# Patient Record
Sex: Female | Born: 1950
Health system: Southern US, Community
[De-identification: ages and names within clinical notes are randomized; demographics above are authoritative.]

## PROBLEM LIST (undated history)

## (undated) DIAGNOSIS — R0600 Dyspnea, unspecified: Secondary | ICD-10-CM

## (undated) DIAGNOSIS — D649 Anemia, unspecified: Secondary | ICD-10-CM

## (undated) DIAGNOSIS — J189 Pneumonia, unspecified organism: Secondary | ICD-10-CM

## (undated) DIAGNOSIS — R591 Generalized enlarged lymph nodes: Secondary | ICD-10-CM

## (undated) DIAGNOSIS — F32A Depression, unspecified: Secondary | ICD-10-CM

## (undated) DIAGNOSIS — C801 Malignant (primary) neoplasm, unspecified: Secondary | ICD-10-CM

## (undated) DIAGNOSIS — F1910 Other psychoactive substance abuse, uncomplicated: Secondary | ICD-10-CM

## (undated) DIAGNOSIS — R4586 Emotional lability: Secondary | ICD-10-CM

## (undated) DIAGNOSIS — R519 Headache, unspecified: Secondary | ICD-10-CM

## (undated) DIAGNOSIS — J449 Chronic obstructive pulmonary disease, unspecified: Secondary | ICD-10-CM

## (undated) DIAGNOSIS — M1990 Unspecified osteoarthritis, unspecified site: Secondary | ICD-10-CM

## (undated) DIAGNOSIS — G971 Other reaction to spinal and lumbar puncture: Secondary | ICD-10-CM

## (undated) DIAGNOSIS — J45909 Unspecified asthma, uncomplicated: Secondary | ICD-10-CM

## (undated) DIAGNOSIS — E039 Hypothyroidism, unspecified: Secondary | ICD-10-CM

## (undated) DIAGNOSIS — Z87442 Personal history of urinary calculi: Secondary | ICD-10-CM

## (undated) DIAGNOSIS — R51 Headache: Secondary | ICD-10-CM

## (undated) DIAGNOSIS — M869 Osteomyelitis, unspecified: Secondary | ICD-10-CM

## (undated) DIAGNOSIS — G4700 Insomnia, unspecified: Secondary | ICD-10-CM

## (undated) DIAGNOSIS — R918 Other nonspecific abnormal finding of lung field: Secondary | ICD-10-CM

## (undated) DIAGNOSIS — E278 Other specified disorders of adrenal gland: Secondary | ICD-10-CM

## (undated) HISTORY — DX: Unspecified asthma, uncomplicated: J45.909

## (undated) HISTORY — DX: Generalized enlarged lymph nodes: R59.1

## (undated) HISTORY — DX: Other psychoactive substance abuse, uncomplicated: F19.10

## (undated) HISTORY — DX: Other nonspecific abnormal finding of lung field: R91.8

## (undated) HISTORY — PX: ANKLE SURGERY: SHX546

## (undated) HISTORY — DX: Other specified disorders of adrenal gland: E27.8

## (undated) HISTORY — DX: Emotional lability: R45.86

## (undated) HISTORY — DX: Osteomyelitis, unspecified: M86.9

## (undated) HISTORY — DX: Chronic obstructive pulmonary disease, unspecified: J44.9

## (undated) HISTORY — PX: APPENDECTOMY: SHX54

## (undated) HISTORY — DX: Insomnia, unspecified: G47.00

## (undated) HISTORY — PX: TONSILLECTOMY: SUR1361

---

## 2009-10-22 ENCOUNTER — Observation Stay (HOSPITAL_COMMUNITY): Admission: RE | Admit: 2009-10-22 | Discharge: 2009-10-23 | Payer: Self-pay | Admitting: Orthopedic Surgery

## 2009-10-22 DIAGNOSIS — M869 Osteomyelitis, unspecified: Secondary | ICD-10-CM

## 2009-10-22 HISTORY — DX: Osteomyelitis, unspecified: M86.9

## 2009-10-23 ENCOUNTER — Ambulatory Visit: Payer: Self-pay | Admitting: Infectious Diseases

## 2009-10-25 ENCOUNTER — Ambulatory Visit: Payer: Self-pay | Admitting: Infectious Diseases

## 2009-10-26 ENCOUNTER — Telehealth: Payer: Self-pay | Admitting: Infectious Diseases

## 2009-10-26 ENCOUNTER — Encounter: Payer: Self-pay | Admitting: Infectious Diseases

## 2009-10-27 ENCOUNTER — Encounter (INDEPENDENT_AMBULATORY_CARE_PROVIDER_SITE_OTHER): Payer: Self-pay | Admitting: *Deleted

## 2009-11-02 ENCOUNTER — Telehealth: Payer: Self-pay | Admitting: Infectious Diseases

## 2009-11-04 ENCOUNTER — Encounter: Payer: Self-pay | Admitting: Infectious Diseases

## 2009-11-10 ENCOUNTER — Encounter: Payer: Self-pay | Admitting: Infectious Diseases

## 2009-11-18 ENCOUNTER — Encounter: Payer: Self-pay | Admitting: Infectious Diseases

## 2009-11-20 ENCOUNTER — Telehealth: Payer: Self-pay | Admitting: Infectious Diseases

## 2009-11-26 ENCOUNTER — Encounter: Payer: Self-pay | Admitting: Infectious Diseases

## 2011-01-18 NOTE — Progress Notes (Signed)
Summary: plan care oversight  Phone Note Outgoing Call   Summary of Call: [phone] mins) [phone] (30 or more mins) [phone](> 60 mins) I have supervised home care and/or infusion therapy for this pt, including providing orders for care, review of labs and/or home health care plans, communicating with the home health care professionals and/or patient/caregivers to integrate current information into the medical treatment plan and/or adjust the medical therapy. This supervision has been provided for 31 minutes during the calendar month. Dates for this oversight 10/24/2009- 12//06/2009   Initial call taken by: Clydie Braun MD,  November 02, 2009 9:21 AM

## 2011-01-18 NOTE — Progress Notes (Signed)
Summary: MSSA on ankle cx - changed to ancef from vanco  Phone Note Outgoing Call   Summary of Call: reviewed cx results - mssa. spoke with pharm at [facility] and changed from vanco to ancef.  I had tried to call the number listed but it was wrong number [phone] is the phone num they have and I spoke to her husband and update him. Initial call taken by: Clydie Braun MD,  October 26, 2009 9:57 AM

## 2011-01-18 NOTE — Miscellaneous (Signed)
Summary: Problems, and Allergies  Clinical Lists Changes  Problems: Added new problem of UNSPECIFIED INFECTION OF BONE ANKLE AND FOOT (ICD-730.97) - left ankle Allergies: Added new allergy or adverse reaction of PCN Observations: Added new observation of NKA: F (10/27/2009 16:11)

## 2011-03-23 LAB — ANAEROBIC CULTURE

## 2011-03-23 LAB — BASIC METABOLIC PANEL
BUN: 13 mg/dL (ref 6–23)
CO2: 27 mEq/L (ref 19–32)
Calcium: 9.1 mg/dL (ref 8.4–10.5)
Chloride: 106 mEq/L (ref 96–112)
Creatinine, Ser: 0.76 mg/dL (ref 0.4–1.2)
GFR calc Af Amer: >60 mL/min (ref >60)
GFR calc non Af Amer: >60 mL/min (ref >60)
Glucose, Bld: 88 mg/dL (ref 70–99)
Potassium: 4.1 mEq/L (ref 3.5–5.1)
Sodium: 141 mEq/L (ref 135–145)

## 2011-03-23 LAB — DIFFERENTIAL
Basophils Absolute: 0.1 10*3/uL (ref 0.0–0.1)
Basophils Absolute: 0.1 10*3/uL (ref 0.0–0.1)
Basophils Relative: 1 % (ref 0–1)
Basophils Relative: 1 % (ref 0–1)
Eosinophils Absolute: 0.2 10*3/uL (ref 0.0–0.7)
Eosinophils Absolute: 0.4 10*3/uL (ref 0.0–0.7)
Eosinophils Relative: 2 % (ref 0–5)
Eosinophils Relative: 4 % (ref 0–5)
Lymphocytes Relative: 11 % — ABNORMAL LOW (ref 12–46)
Lymphocytes Relative: 11 % — ABNORMAL LOW (ref 12–46)
Lymphs Abs: 0.9 10*3/uL (ref 0.7–4.0)
Lymphs Abs: 1.2 10*3/uL (ref 0.7–4.0)
Monocytes Absolute: 0.8 10*3/uL (ref 0.1–1.0)
Monocytes Absolute: 0.8 10*3/uL (ref 0.1–1.0)
Monocytes Relative: 8 % (ref 3–12)
Monocytes Relative: 9 % (ref 3–12)
Neutro Abs: 6.6 10*3/uL (ref 1.7–7.7)
Neutro Abs: 8.6 10*3/uL — ABNORMAL HIGH (ref 1.7–7.7)
Neutrophils Relative %: 76 % (ref 43–77)
Neutrophils Relative %: 78 % — ABNORMAL HIGH (ref 43–77)

## 2011-03-23 LAB — CBC
HCT: 30 % — ABNORMAL LOW (ref 36.0–46.0)
HCT: 33.5 % — ABNORMAL LOW (ref 36.0–46.0)
Hemoglobin: 10.3 g/dL — ABNORMAL LOW (ref 12.0–15.0)
Hemoglobin: 11.7 g/dL — ABNORMAL LOW (ref 12.0–15.0)
MCHC: 34.3 g/dL (ref 30.0–36.0)
MCHC: 35 g/dL (ref 30.0–36.0)
MCV: 89.2 fL (ref 78.0–100.0)
MCV: 89.7 fL (ref 78.0–100.0)
Platelets: 241 10*3/uL (ref 150–400)
Platelets: 290 10*3/uL (ref 150–400)
RBC: 3.35 MIL/uL — ABNORMAL LOW (ref 3.87–5.11)
RBC: 3.76 MIL/uL — ABNORMAL LOW (ref 3.87–5.11)
RDW: 12.9 % (ref 11.5–15.5)
RDW: 13 % (ref 11.5–15.5)
WBC: 11 10*3/uL — ABNORMAL HIGH (ref 4.0–10.5)
WBC: 8.8 10*3/uL (ref 4.0–10.5)

## 2011-03-23 LAB — WOUND CULTURE

## 2011-03-23 LAB — SEDIMENTATION RATE: Sed Rate: 98 mm/hr — ABNORMAL HIGH (ref 0–22)

## 2011-03-23 LAB — APTT: aPTT: 34 seconds (ref 24–37)

## 2011-03-23 LAB — C-REACTIVE PROTEIN: CRP: 15 mg/dL — ABNORMAL HIGH (ref <0.6)

## 2011-03-23 LAB — PROTIME-INR
INR: 1.12 (ref 0.00–1.49)
Prothrombin Time: 14.3 seconds (ref 11.6–15.2)

## 2013-06-07 ENCOUNTER — Encounter: Payer: Self-pay | Admitting: Pulmonary Disease

## 2013-06-10 ENCOUNTER — Institutional Professional Consult (permissible substitution): Payer: Self-pay | Admitting: Pulmonary Disease

## 2013-06-11 ENCOUNTER — Institutional Professional Consult (permissible substitution): Payer: Self-pay | Admitting: Cardiovascular Disease

## 2013-06-11 ENCOUNTER — Encounter: Payer: Self-pay | Admitting: *Deleted

## 2013-06-11 DIAGNOSIS — G4700 Insomnia, unspecified: Secondary | ICD-10-CM | POA: Insufficient documentation

## 2013-06-11 DIAGNOSIS — J45909 Unspecified asthma, uncomplicated: Secondary | ICD-10-CM | POA: Insufficient documentation

## 2013-06-11 DIAGNOSIS — J449 Chronic obstructive pulmonary disease, unspecified: Secondary | ICD-10-CM | POA: Insufficient documentation

## 2013-06-11 DIAGNOSIS — R4586 Emotional lability: Secondary | ICD-10-CM | POA: Insufficient documentation

## 2014-04-20 ENCOUNTER — Encounter (HOSPITAL_COMMUNITY): Payer: Self-pay | Admitting: Emergency Medicine

## 2014-04-20 ENCOUNTER — Emergency Department (HOSPITAL_COMMUNITY)
Admission: EM | Admit: 2014-04-20 | Discharge: 2014-04-20 | Disposition: A | Discharge status: Home / Self Care | Payer: No Typology Code available for payment source | Source: Home / Self Care | Attending: Family Medicine | Admitting: Family Medicine

## 2014-04-20 DIAGNOSIS — I889 Nonspecific lymphadenitis, unspecified: Secondary | ICD-10-CM

## 2014-04-20 MED ORDER — CLINDAMYCIN HCL 300 MG PO CAPS: 300.0000 mg | ORAL_CAPSULE | Freq: Three times a day (TID) | ORAL | Status: DC

## 2014-04-20 MED ORDER — HYDROCODONE-ACETAMINOPHEN 5-325 MG PO TABS: 1.0000 | ORAL_TABLET | Freq: Four times a day (QID) | ORAL | Status: DC | PRN

## 2014-04-20 MED ORDER — PREDNISONE 10 MG PO TABS: 30.0000 mg | ORAL_TABLET | Freq: Every day | ORAL | Status: DC

## 2014-04-20 NOTE — ED Provider Notes (Signed)
Tammy Bowers is a 63 y.o. female who presents to Urgent Care today for throat pain and ear pain. Symptoms present for about 3 weeks worsening recently. Patient's symptoms are associated with Raynaud's and sneezing. She notes tenderness at the angle of the jaw bilaterally. Pain is worse with swallowing. Pain is mild. She's not tried any medications. No fevers chills nausea vomiting or diarrhea. Patient has cough and wheezing over this is normal for her as she has moderate to severe COPD.   Past Medical History  Diagnosis Date  . Asthma   . UNSPECIFIED INFECTION OF BONE ANKLE AND FOOT 10/22/2009    Annotation: left ankle Qualifier: Diagnosis of  By: Gustavo Lah    . COPD (chronic obstructive pulmonary disease)   . Insomnia   . Mood swing    History  Substance Use Topics  . Smoking status: Current Every Day Smoker  . Smokeless tobacco: Not on file  . Alcohol Use: No   ROS as above Medications: No current facility-administered medications for this encounter.   Current Outpatient Prescriptions  Medication Sig Dispense Refill  . Aclidinium Bromide (TUDORZA PRESSAIR) 400 MCG/ACT AEPB Inhale 1 puff into the lungs 2 (two) times daily.      Marland Kitchen albuterol (PROVENTIL HFA;VENTOLIN HFA) 108 (90 BASE) MCG/ACT inhaler Inhale 2 puffs into the lungs every 4 (four) hours as needed for wheezing.      . beclomethasone (QVAR) 80 MCG/ACT inhaler Inhale 2 puffs into the lungs daily.      Marland Kitchen FLUoxetine HCl (PROZAC PO) Take by mouth.      Marland Kitchen buPROPion (WELLBUTRIN) 100 MG tablet Take 100 mg by mouth 3 (three) times daily.      . clindamycin (CLEOCIN) 300 MG capsule Take 1 capsule (300 mg total) by mouth 3 (three) times daily.  21 capsule  0  . HYDROcodone-acetaminophen (NORCO/VICODIN) 5-325 MG per tablet Take 1 tablet by mouth every 6 (six) hours as needed.  10 tablet  0  . ipratropium (ATROVENT HFA) 17 MCG/ACT inhaler Inhale 2 puffs into the lungs 3 (three) times daily.      . predniSONE (DELTASONE) 10 MG  tablet Take 3 tablets (30 mg total) by mouth daily.  15 tablet  0  . roflumilast (DALIRESP) 500 MCG TABS tablet Take 500 mcg by mouth daily.        Exam:  BP 126/87  Pulse 92  Temp(Src) 98.3 F (36.8 C) (Oral)  Resp 19  SpO2 96% Gen: Well NAD HEENT: EOMI,  MMM, tender mild bilateral anterior cervical lymphadenopathy. Tympanic membranes are normal appearing bilaterally. Posterior pharynx with cobblestoning. Lungs: Normal work of breathing. CTABL Heart: RRR no MRG Abd: NABS, Soft. NT, ND Exts: Brisk capillary refill, warm and well perfused.   No results found for this or any previous visit (from the past 24 hour(s)). No results found.  Assessment and Plan: 63 y.o. female with lymphadenitis. Plan to treat with prednisone and clindamycin. Followup with primary care provider.  Discussed warning signs or symptoms. Please see discharge instructions. Patient expresses understanding.    Gregor Hams, MD 04/20/14 [phone]

## 2014-04-20 NOTE — ED Notes (Signed)
Assessment per Dr. Georgina Snell.

## 2014-04-20 NOTE — Discharge Instructions (Signed)
Thank you for coming in today. Call or go to the emergency room if you get worse, have trouble breathing, have chest pains, or palpitations.   Cervical Adenitis You have a swollen lymph gland in your neck. This commonly happens with Strep and virus infections, dental problems, insect bites, and injuries about the face, scalp, or neck. The lymph glands swell as the body fights the infection or heals the injury. Swelling and firmness typically lasts for several weeks after the infection or injury is healed. Rarely lymph glands can become swollen because of cancer or TB. Antibiotics are prescribed if there is evidence of an infection. Sometimes an infected lymph gland becomes filled with pus. This condition may require opening up the abscessed gland by draining it surgically. Most of the time infected glands return to normal within two weeks. Do not poke or squeeze the swollen lymph nodes. That may keep them from shrinking back to their normal size. If the lymph gland is still swollen after 2 weeks, further medical evaluation is needed.  SEEK IMMEDIATE MEDICAL CARE IF:  You have difficulty swallowing or breathing, increased swelling, severe pain, or a high fever.  Document Released: 12/05/2005 Document Revised: 02/27/2012 Document Reviewed: 05/27/2007 [facility] Patient Information 2014 [place].

## 2016-02-15 ENCOUNTER — Ambulatory Visit
Admission: RE | Admit: 2016-02-15 | Discharge: 2016-02-15 | Disposition: A | Discharge status: Home / Self Care | Payer: No Typology Code available for payment source | Source: Ambulatory Visit | Attending: Family Medicine | Admitting: Family Medicine

## 2016-02-15 ENCOUNTER — Other Ambulatory Visit: Payer: Self-pay | Admitting: Cardiovascular Disease

## 2016-02-15 ENCOUNTER — Other Ambulatory Visit: Payer: Self-pay | Admitting: Family Medicine

## 2016-02-15 DIAGNOSIS — R0602 Shortness of breath: Secondary | ICD-10-CM

## 2016-10-10 ENCOUNTER — Ambulatory Visit
Admission: RE | Admit: 2016-10-10 | Discharge: 2016-10-10 | Disposition: A | Discharge status: Home / Self Care | Payer: BLUE CROSS/BLUE SHIELD | Source: Ambulatory Visit | Attending: Family Medicine | Admitting: Family Medicine

## 2016-10-10 ENCOUNTER — Other Ambulatory Visit: Payer: Self-pay | Admitting: Family Medicine

## 2016-10-10 DIAGNOSIS — J441 Chronic obstructive pulmonary disease with (acute) exacerbation: Secondary | ICD-10-CM

## 2016-10-10 DIAGNOSIS — R918 Other nonspecific abnormal finding of lung field: Secondary | ICD-10-CM

## 2016-10-10 DIAGNOSIS — R599 Enlarged lymph nodes, unspecified: Secondary | ICD-10-CM | POA: Insufficient documentation

## 2016-10-10 DIAGNOSIS — E278 Other specified disorders of adrenal gland: Secondary | ICD-10-CM

## 2016-10-10 DIAGNOSIS — R591 Generalized enlarged lymph nodes: Secondary | ICD-10-CM | POA: Insufficient documentation

## 2016-10-10 HISTORY — DX: Other specified disorders of adrenal gland: E27.8

## 2016-10-10 HISTORY — DX: Other nonspecific abnormal finding of lung field: R91.8

## 2016-10-10 HISTORY — DX: Enlarged lymph nodes, unspecified: R59.9

## 2016-10-11 ENCOUNTER — Ambulatory Visit
Admission: RE | Admit: 2016-10-11 | Discharge: 2016-10-11 | Disposition: A | Discharge status: Home / Self Care | Payer: BLUE CROSS/BLUE SHIELD | Source: Ambulatory Visit | Attending: Family Medicine | Admitting: Family Medicine

## 2016-10-11 DIAGNOSIS — R918 Other nonspecific abnormal finding of lung field: Secondary | ICD-10-CM

## 2016-10-11 MED ORDER — IOPAMIDOL (ISOVUE-300) INJECTION 61%
75.0000 mL | Freq: Once | INTRAVENOUS | Status: AC | PRN
  Administered 2016-10-11: 75 mL via INTRAVENOUS

## 2016-10-13 ENCOUNTER — Encounter: Payer: Self-pay | Admitting: *Deleted

## 2016-10-13 ENCOUNTER — Institutional Professional Consult (permissible substitution) (INDEPENDENT_AMBULATORY_CARE_PROVIDER_SITE_OTHER): Payer: BLUE CROSS/BLUE SHIELD | Admitting: Cardiothoracic Surgery

## 2016-10-13 VITALS — BP 129/92 | HR 110 | Resp 20 | Ht 66.0 in | Wt 121.0 lb

## 2016-10-13 DIAGNOSIS — C3491 Malignant neoplasm of unspecified part of right bronchus or lung: Secondary | ICD-10-CM

## 2016-10-13 DIAGNOSIS — R591 Generalized enlarged lymph nodes: Secondary | ICD-10-CM

## 2016-10-13 DIAGNOSIS — R599 Enlarged lymph nodes, unspecified: Secondary | ICD-10-CM

## 2016-10-13 DIAGNOSIS — R918 Other nonspecific abnormal finding of lung field: Secondary | ICD-10-CM

## 2016-10-13 DIAGNOSIS — E278 Other specified disorders of adrenal gland: Secondary | ICD-10-CM

## 2016-10-13 DIAGNOSIS — F1910 Other psychoactive substance abuse, uncomplicated: Secondary | ICD-10-CM

## 2016-10-13 NOTE — Progress Notes (Signed)
[address]       [place] [address]             [phone]                    Tammy Bowers  Medical Record #[id number] Date of Birth: May 22, 1951  Referring: Lujean Amel, MD Primary Care: No primary care provider on file.  Chief Complaint:    Chief Complaint  Patient presents with  . Lung Mass    Surgical eval, Chest CT 10/11/16    History of Present Illness:    Tammy Bowers 65 y.o. female is seen in the office   for Spot on her lung. The patient had noted increasing shortness of breath coughing usually dry and nonproductive, she's noted no hemoptysis, she quit smoking 2 months ago after smoking for 40 years. A recent chest x-ray suggested a right hilar mass, follow-up CT scan was performed and she is referred to thoracic surgery office for further evaluation and treatment.    Current Activity/ Functional Status:  Patient is independent with mobility/ambulation, transfers, ADL's, IADL's.   Zubrod Score: At the time of surgery this patient's most appropriate activity status/level should be described as: '[]'$     0    Normal activity, no symptoms '[x]'$     1    Restricted in physical strenuous activity but ambulatory, able to do out light work '[]'$     2    Ambulatory and capable of self care, unable to do work activities, up and about               >50 % of waking hours                              '[]'$     3    Only limited self care, in bed greater than 50% of waking hours '[]'$     4    Completely disabled, no self care, confined to bed or chair '[]'$     5    Moribund   Past Medical History:  Diagnosis Date  . Adenopathy 10/10/2016   PERICARINAL  . Asthma   . COPD (chronic obstructive pulmonary disease) ([place])   . Hilar mass 10/10/2016   RIGHT  . Insomnia   . Lung nodules 10/10/2016   RIGHT LOWER LOBE  . Mass of both adrenal glands ([place]) 10/10/2016  . Mood swing ([place])   . Substance abuse    TOBACCO  . UNSPECIFIED INFECTION OF BONE ANKLE AND FOOT  10/22/2009   Annotation: left ankle Qualifier: Diagnosis of  By: Gustavo Lah      Past Surgical History:  Procedure Laterality Date  . ANKLE SURGERY    . APPENDECTOMY      Family History  Problem Relation Age of Onset  . Heart disease Father   . Heart disease Paternal Grandfather     Social History   Social History  . Marital status: Married    Spouse name: N/A  . Number of children: 2  . Years of education: N/A   Occupational History  . retired    Social History Main Topics  . Smoking status: Former Smoker    Packs/day: 1.00    Years: 50.00    Types: Cigarettes    Quit date: 08/02/2016  . Smokeless tobacco: Never Used  . Alcohol use No  . Drug use:  Types: Marijuana  . Sexual activity: Yes    Partners: Male    Birth control/ protection: Post-menopausal   Other Topics Concern  . Not on file   Social History Narrative  . No narrative on file    History  Smoking Status  . Former Smoker  . Packs/day: 1.00  . Years: 50.00  . Types: Cigarettes  . Quit date: 08/02/2016  Smokeless Tobacco  . Never Used    History  Alcohol Use No     Allergies  Allergen Reactions  . Lidocaine Swelling    Current Outpatient Prescriptions  Medication Sig Dispense Refill  . albuterol (PROVENTIL HFA;VENTOLIN HFA) 108 (90 BASE) MCG/ACT inhaler Inhale 2 puffs into the lungs every 4 (four) hours as needed for wheezing.    . Fluticasone-Salmeterol (ADVAIR) 500-50 MCG/DOSE AEPB Inhale 1 puff into the lungs 2 (two) times daily.    Marland Kitchen tiotropium (SPIRIVA) 18 MCG inhalation capsule Place 18 mcg into inhaler and inhale daily.    Marland Kitchen aspirin 325 MG EC tablet Take 325 mg by mouth 3 (three) times daily as needed for pain.    . Misc Natural Products (GLUCOSAMINE CHOND COMPLEX/MSM PO) Take 1 tablet by mouth every other day.    . Multiple Vitamin (MULTIVITAMIN WITH MINERALS) TABS tablet Take 1 tablet by mouth daily. Women's Multivitamin    . Omega-3 Fatty Acids (FISH OIL) 1000 MG  CAPS Take 1,000 mg by mouth every other day.     No current facility-administered medications for this visit.       Review of Systems:     Cardiac Review of Systems: Y or N  Chest Pain [  n  ]  Resting SOB [ y  ] Exertional SOB  Blue.Reese  ]  Orthopnea [ y ]   Pedal Edema [n   ]    Palpitations Florencio.Farrier  ] Syncope  [ n ]   Presyncope [ n  ]  General Review of Systems: [Y] = yes [  ]=no Constitional: recent weight change [y];  Wt loss over the last 3 months [place]   ] anorexia [  ]; fatigue [  ]; nausea [  ]; night sweats [  ]; fever [ n ]; or chills [  ];          Dental: poor dentition[ y ]; Last Dentist visit:   Eye : blurred vision [  ]; diplopia [   ]; vision changes [  ];  Amaurosis fugax[  ]; Resp: cough [ y ];  wheezing[y  ];  hemoptysis[n  ]; shortness of breath[y  ]; paroxysmal nocturnal dyspnea[  ]; dyspnea on exertion[  ]; or orthopnea[  ];  GI:  gallstones[  ], vomiting[  ];  dysphagia[  ]; melena[  ];  hematochezia [  ]; heartburn[  ];   Hx of  Colonoscopy[  ]; GU: kidney stones [  ]; hematuria[  ];   dysuria [  ];  nocturia[  ];  history of     obstruction [  ]; urinary frequency [  ]             Skin: rash, swelling[  ];, hair loss[  ];  peripheral edema[  ];  or itching[  ]; Musculosketetal: myalgias[  ];  joint swelling[  ];  joint erythema[  ];  joint pain[  ];  back pain[  ];  Heme/Lymph: bruising[  ];  bleeding[  ];  anemia[  ];  Neuro: TIA[  ];  headaches[y  ];  stroke[  ];  vertigo[  ];  seizures[  ];   paresthesias[y  ];  difficulty walking[  ];  Psych:depression[  ]; anxiety[  ];  Endocrine: diabetes[ n ];  thyroid dysfunction[ n ];  Immunizations: Flu up to date [  y]; Pneumococcal up to date [n  ];  Other:  Physical Exam: BP (!) 129/92   Pulse (!) 110   Resp 20   Ht '5\' 6"'$  (1.676 m)   Wt 121 lb (54.9 kg)   SpO2 92% Comment: RA  BMI 19.53 kg/m   PHYSICAL EXAMINATION: General appearance: alert, cooperative, appears older than stated age and pale Head: Normocephalic,  without obvious abnormality, atraumatic Neck: no adenopathy, no carotid bruit, no JVD, supple, symmetrical, trachea midline and thyroid not enlarged, symmetric, no tenderness/mass/nodules Lymph nodes: Cervical, supraclavicular, and axillary nodes normal. Resp: diminished breath sounds RLL and RML Back: symmetric, no curvature. ROM normal. No CVA tenderness. Cardio: regular rate and rhythm, S1, S2 normal, no murmur, click, rub or gallop GI: soft, non-tender; bowel sounds normal; no masses,  no organomegaly Extremities: extremities normal, atraumatic, no cyanosis or edema and Homans sign is negative, no sign of DVT Neurologic: Grossly normal  Diagnostic Studies & Laboratory data:     Recent Radiology Findings:   Dg Chest 2 View  Result Date: 10/10/2016 CLINICAL DATA:  Cough, congestion, night sweats. EXAM: CHEST  2 VIEW COMPARISON:  02/15/2016. FINDINGS: Trachea is midline. Heart size normal. There is a right perihilar/ right lower lobe mass with associated airway narrowing. Mild volume loss at the base of the right hemi thorax. Lungs are hyperinflated but otherwise clear. No pleural fluid. IMPRESSION: Right perihilar mass is highly worrisome for primary bronchogenic carcinoma. CT chest with contrast is recommended in further evaluation. These results will be called to the ordering clinician or representative by the Radiologist Assistant, and communication documented in the PACS or zVision Dashboard. Electronically Signed   By: Lorin Picket M.D.   On: 10/10/2016 10:59   Ct Chest W Contrast  Result Date: 10/11/2016 CLINICAL DATA:  Right hilar lung mass EXAM: CT CHEST WITH CONTRAST TECHNIQUE: Multidetector CT imaging of the chest was performed during intravenous contrast administration. CONTRAST:  75m ISOVUE-300 IOPAMIDOL (ISOVUE-300) INJECTION 61% COMPARISON:  Chest radiograph of 10/10/2016 FINDINGS: Cardiovascular: Atherosclerosis of the thoracic aorta at its arch. Takeoff of the great vessels  is unremarkable. Normal sized cardiac chambers. Trace anterior pericardial effusion measuring up to 9 mm on series 2, image 99. Coronary arteriosclerosis noted. Mediastinum/Nodes: Precarinal lymphadenopathy measuring 2.5 cm short axis, series 2, image 53. 11 mm short axis subcarinal lymph node, series 5 image 66. 6 mm AP window short axis lymph node series 5, image 47. No pathologically enlarged axillary or supraclavicular lymphadenopathy. Hyperdense appearing foci in the right hilum and subcarinal region likely represent contrast within tiny vessels. Small calcified lymph nodes are not entirely excluded. Lungs/Pleura: There is an approximately 5.7 x 4.8 x 6.1 cm right hilar mass with mass effect on the distal right pulmonary artery causing narrowing but without complete occlusion. Smaller tiny satellite nodules are seen in the right lower lobe, series 5, image 84 measuring up to 5 mm. More irregular masslike opacities adjacent to the right hilar mass, series 5, image 77 and series 3, image 104 likely represent additional metastatic lesions and/or postobstructive consolidation. This measures approximately 3.1 x 2.7 x 2.7 cm in the superior segment right lower lobe. There is marked narrowing of the right lower lobe  bronchus with postobstructive airspace disease to the right lower lobe. There is right middle lobe collapse with volume loss and slight mediastinal shift to the right as a result. Within the right mainstem bronchus is a polypoid soft tissue density suspicious for endobronchial invasion, series 5, image 63 measuring approximately 7 mm. No pleural effusion. Minimal right lower lobe dependent atelectasis. No pleural effusion. Upper Abdomen: 2.8 x 2.6 x 3 cm right adrenal mass with Hounsfield unit of 35 post IV contrast. Left adrenal gland is slightly nodular at the apex as well measuring up to 9 mm in diameter. Musculoskeletal: No lytic or blastic appearing abnormality. IMPRESSION: Right hilar 5.7 x 4.8 x 6.1  cm mass causing mass effect on the distal right pulmonary artery and right lower lobe bronchus and causing right middle lobe collapse. There are smaller nodular as well as ill-defined satellite lesions in the superior segment of the right lower lobe, right precarinal lymphadenopathy and bilateral adrenal masses consistent with metastasis. Electronically Signed   By: Ashley Royalty M.D.   On: 10/11/2016 15:36     I have independently reviewed the above radiologic studies.  Recent Lab Findings: Lab Results  Component Value Date   WBC 8.8 10/23/2009   HGB 10.3 (L) 10/23/2009   HCT 30.0 (L) 10/23/2009   PLT 241 10/23/2009   GLUCOSE 88 10/22/2009   NA 141 10/22/2009   K 4.1 10/22/2009   CL 106 10/22/2009   CREATININE 0.76 10/22/2009   BUN 13 10/22/2009   CO2 27 10/22/2009   INR 1.12 10/22/2009      Assessment / Plan:   Patient with clinical stage IV carcinoma of the lung if the adrenal mass represent tumor. I discussed with the patient and her husband the probable diagnosis of advanced stage lung cancer. I recommended that we proceed as soon as possible with obtaining a tissue diagnosis, with bronchoscopy ebus. We'll also obtain a brain scan and PET scan. The patient will be referred to the multidisciplinary thoracic oncology clinic as soon as possible. Tentative plan for biopsy on Tuesday, October 31 and hopefully can obtain brain scan prior to this. Patient husband had their questions answered.      I  spent 40 minutes counseling the patient face to face and 50% or more the  time was spent in counseling and coordination of care. The total time spent in the appointment was 60 minutes.  Grace Isaac MD      [address] [place] [address] Office [phone]   Beeper [phone]  10/15/2016 2:07 PM

## 2016-10-14 ENCOUNTER — Other Ambulatory Visit: Payer: Self-pay | Admitting: *Deleted

## 2016-10-14 ENCOUNTER — Encounter: Payer: Self-pay | Admitting: *Deleted

## 2016-10-14 DIAGNOSIS — R918 Other nonspecific abnormal finding of lung field: Secondary | ICD-10-CM

## 2016-10-14 DIAGNOSIS — C7931 Secondary malignant neoplasm of brain: Secondary | ICD-10-CM

## 2016-10-17 ENCOUNTER — Other Ambulatory Visit: Payer: Self-pay | Admitting: Cardiothoracic Surgery

## 2016-10-18 ENCOUNTER — Encounter (HOSPITAL_COMMUNITY): Payer: Self-pay | Admitting: *Deleted

## 2016-10-18 NOTE — Progress Notes (Signed)
Spoke with pt for pre-op call. Pt denies cardiac history, chest pain or sob.

## 2016-10-19 ENCOUNTER — Ambulatory Visit (HOSPITAL_COMMUNITY)
Admission: RE | Admit: 2016-10-19 | Discharge: 2016-10-19 | Disposition: A | Discharge status: Home / Self Care | Payer: BLUE CROSS/BLUE SHIELD | Source: Ambulatory Visit | Attending: Cardiothoracic Surgery | Admitting: Cardiothoracic Surgery

## 2016-10-19 ENCOUNTER — Telehealth: Payer: Self-pay | Admitting: *Deleted

## 2016-10-19 ENCOUNTER — Encounter (HOSPITAL_COMMUNITY): Payer: Self-pay | Admitting: *Deleted

## 2016-10-19 ENCOUNTER — Other Ambulatory Visit: Payer: BLUE CROSS/BLUE SHIELD

## 2016-10-19 ENCOUNTER — Ambulatory Visit (HOSPITAL_COMMUNITY): Payer: BLUE CROSS/BLUE SHIELD | Admitting: Anesthesiology

## 2016-10-19 ENCOUNTER — Ambulatory Visit (HOSPITAL_COMMUNITY): Payer: BLUE CROSS/BLUE SHIELD

## 2016-10-19 ENCOUNTER — Encounter (HOSPITAL_COMMUNITY)
Admission: RE | Disposition: A | Discharge status: Home / Self Care | Payer: Self-pay | Source: Ambulatory Visit | Attending: Cardiothoracic Surgery

## 2016-10-19 DIAGNOSIS — Z79899 Other long term (current) drug therapy: Secondary | ICD-10-CM | POA: Diagnosis not present

## 2016-10-19 DIAGNOSIS — R918 Other nonspecific abnormal finding of lung field: Secondary | ICD-10-CM | POA: Diagnosis present

## 2016-10-19 DIAGNOSIS — Z7982 Long term (current) use of aspirin: Secondary | ICD-10-CM | POA: Insufficient documentation

## 2016-10-19 DIAGNOSIS — J449 Chronic obstructive pulmonary disease, unspecified: Secondary | ICD-10-CM | POA: Insufficient documentation

## 2016-10-19 DIAGNOSIS — C3411 Malignant neoplasm of upper lobe, right bronchus or lung: Secondary | ICD-10-CM | POA: Diagnosis not present

## 2016-10-19 DIAGNOSIS — Z87891 Personal history of nicotine dependence: Secondary | ICD-10-CM | POA: Insufficient documentation

## 2016-10-19 DIAGNOSIS — C3401 Malignant neoplasm of right main bronchus: Secondary | ICD-10-CM | POA: Diagnosis not present

## 2016-10-19 DIAGNOSIS — C771 Secondary and unspecified malignant neoplasm of intrathoracic lymph nodes: Secondary | ICD-10-CM | POA: Diagnosis not present

## 2016-10-19 HISTORY — DX: Dyspnea, unspecified: R06.00

## 2016-10-19 HISTORY — DX: Pneumonia, unspecified organism: J18.9

## 2016-10-19 HISTORY — DX: Unspecified osteoarthritis, unspecified site: M19.90

## 2016-10-19 HISTORY — PX: VIDEO BRONCHOSCOPY WITH ENDOBRONCHIAL ULTRASOUND: SHX6177

## 2016-10-19 HISTORY — DX: Anemia, unspecified: D64.9

## 2016-10-19 HISTORY — PX: LUNG BIOPSY: SHX5088

## 2016-10-19 HISTORY — DX: Headache: R51

## 2016-10-19 HISTORY — DX: Personal history of urinary calculi: Z87.442

## 2016-10-19 HISTORY — DX: Headache, unspecified: R51.9

## 2016-10-19 LAB — COMPREHENSIVE METABOLIC PANEL
ALT: 20 U/L (ref 14–54)
AST: 27 U/L (ref 15–41)
Albumin: 3.4 g/dL — ABNORMAL LOW (ref 3.5–5.0)
Alkaline Phosphatase: 79 U/L (ref 38–126)
Anion gap: 7 (ref 5–15)
BUN: 16 mg/dL (ref 6–20)
CO2: 24 mmol/L (ref 22–32)
Calcium: 9 mg/dL (ref 8.9–10.3)
Chloride: 110 mmol/L (ref 101–111)
Creatinine, Ser: 0.73 mg/dL (ref 0.44–1.00)
GFR calc Af Amer: >60 mL/min (ref >60)
GFR calc non Af Amer: >60 mL/min (ref >60)
Glucose, Bld: 82 mg/dL (ref 65–99)
Potassium: 4.2 mmol/L (ref 3.5–5.1)
Sodium: 141 mmol/L (ref 135–145)
Total Bilirubin: 0.4 mg/dL (ref 0.3–1.2)
Total Protein: 6.3 g/dL — ABNORMAL LOW (ref 6.5–8.1)

## 2016-10-19 LAB — CBC
HCT: 44 % (ref 36.0–46.0)
Hemoglobin: 14.6 g/dL (ref 12.0–15.0)
MCH: 29.3 pg (ref 26.0–34.0)
MCHC: 33.2 g/dL (ref 30.0–36.0)
MCV: 88.4 fL (ref 78.0–100.0)
Platelets: 307 10*3/uL (ref 150–400)
RBC: 4.98 MIL/uL (ref 3.87–5.11)
RDW: 13.7 % (ref 11.5–15.5)
WBC: 8.6 10*3/uL (ref 4.0–10.5)

## 2016-10-19 LAB — PROTIME-INR
INR: 1.03
Prothrombin Time: 13.5 seconds (ref 11.4–15.2)

## 2016-10-19 LAB — APTT: aPTT: 35 seconds (ref 24–36)

## 2016-10-19 SURGERY — BRONCHOSCOPY, WITH EBUS
Anesthesia: General

## 2016-10-19 MED ORDER — DEXTROSE 5 % IV SOLN
INTRAVENOUS | Status: DC | PRN
  Administered 2016-10-19: 1.5 g via INTRAVENOUS

## 2016-10-19 MED ORDER — FENTANYL CITRATE (PF) 100 MCG/2ML IJ SOLN
25.0000 ug | INTRAMUSCULAR | Status: DC | PRN
  Administered 2016-10-19 (×2): 25 ug via INTRAVENOUS

## 2016-10-19 MED ORDER — PHENYLEPHRINE 40 MCG/ML (10ML) SYRINGE FOR IV PUSH (FOR BLOOD PRESSURE SUPPORT)
PREFILLED_SYRINGE | INTRAVENOUS | Status: AC
  Filled 2016-10-19: qty 10

## 2016-10-19 MED ORDER — ROCURONIUM BROMIDE 10 MG/ML (PF) SYRINGE
PREFILLED_SYRINGE | INTRAVENOUS | Status: AC
  Filled 2016-10-19: qty 10

## 2016-10-19 MED ORDER — ROCURONIUM BROMIDE 100 MG/10ML IV SOLN
INTRAVENOUS | Status: DC | PRN
  Administered 2016-10-19: 40 mg via INTRAVENOUS

## 2016-10-19 MED ORDER — MIDAZOLAM HCL 2 MG/2ML IJ SOLN
INTRAMUSCULAR | Status: AC
  Filled 2016-10-19: qty 2

## 2016-10-19 MED ORDER — LACTATED RINGERS IV SOLN
INTRAVENOUS | Status: DC
Start: 2016-10-19 — End: 2016-10-19
  Administered 2016-10-19 (×2): via INTRAVENOUS

## 2016-10-19 MED ORDER — ONDANSETRON HCL 4 MG/2ML IJ SOLN
INTRAMUSCULAR | Status: AC
  Filled 2016-10-19: qty 2

## 2016-10-19 MED ORDER — CEFUROXIME SODIUM 1.5 G IJ SOLR
INTRAMUSCULAR | Status: AC
  Filled 2016-10-19: qty 1.5

## 2016-10-19 MED ORDER — PROPOFOL 10 MG/ML IV BOLUS
INTRAVENOUS | Status: AC
  Filled 2016-10-19: qty 20

## 2016-10-19 MED ORDER — SUGAMMADEX SODIUM 200 MG/2ML IV SOLN
INTRAVENOUS | Status: AC
  Filled 2016-10-19: qty 2

## 2016-10-19 MED ORDER — EPHEDRINE 5 MG/ML INJ
INTRAVENOUS | Status: AC
  Filled 2016-10-19: qty 10

## 2016-10-19 MED ORDER — SUGAMMADEX SODIUM 200 MG/2ML IV SOLN
INTRAVENOUS | Status: DC | PRN
  Administered 2016-10-19: 150 mg via INTRAVENOUS

## 2016-10-19 MED ORDER — ARTIFICIAL TEARS OP OINT
TOPICAL_OINTMENT | OPHTHALMIC | Status: AC
  Filled 2016-10-19: qty 3.5

## 2016-10-19 MED ORDER — EPINEPHRINE PF 1 MG/ML IJ SOLN
INTRAMUSCULAR | Status: DC | PRN
  Administered 2016-10-19: 1 mg via ENDOTRACHEOPULMONARY

## 2016-10-19 MED ORDER — MIDAZOLAM HCL 5 MG/5ML IJ SOLN
INTRAMUSCULAR | Status: DC | PRN
  Administered 2016-10-19 (×2): 1 mg via INTRAVENOUS

## 2016-10-19 MED ORDER — 0.9 % SODIUM CHLORIDE (POUR BTL) OPTIME
TOPICAL | Status: DC | PRN
  Administered 2016-10-19: 1000 mL

## 2016-10-19 MED ORDER — ONDANSETRON HCL 4 MG/2ML IJ SOLN
INTRAMUSCULAR | Status: DC | PRN
  Administered 2016-10-19: 4 mg via INTRAVENOUS

## 2016-10-19 MED ORDER — PROPOFOL 10 MG/ML IV BOLUS
INTRAVENOUS | Status: DC | PRN
  Administered 2016-10-19: 100 mg via INTRAVENOUS

## 2016-10-19 MED ORDER — FENTANYL CITRATE (PF) 250 MCG/5ML IJ SOLN
INTRAMUSCULAR | Status: AC
  Filled 2016-10-19: qty 5

## 2016-10-19 MED ORDER — FENTANYL CITRATE (PF) 100 MCG/2ML IJ SOLN
INTRAMUSCULAR | Status: DC | PRN
  Administered 2016-10-19: 100 ug via INTRAVENOUS

## 2016-10-19 MED ORDER — LIDOCAINE 2% (20 MG/ML) 5 ML SYRINGE
INTRAMUSCULAR | Status: AC
  Filled 2016-10-19: qty 5

## 2016-10-19 MED ORDER — FENTANYL CITRATE (PF) 100 MCG/2ML IJ SOLN
INTRAMUSCULAR | Status: AC
  Filled 2016-10-19: qty 2

## 2016-10-19 SURGICAL SUPPLY — 26 items
BRUSH CYTOL CELLEBRITY 1.5X140 (MISCELLANEOUS) ×2 IMPLANT
CANISTER SUCTION 2500CC (MISCELLANEOUS) ×2 IMPLANT
CONT SPEC 4OZ CLIKSEAL STRL BL (MISCELLANEOUS) ×2 IMPLANT
COVER DOME SNAP 22 D (MISCELLANEOUS) ×2 IMPLANT
COVER TABLE BACK 60X90 (DRAPES) ×2 IMPLANT
FILTER STRAW FLUID ASPIR (MISCELLANEOUS) ×2 IMPLANT
FORCEPS BIOP RJ4 1.8 (CUTTING FORCEPS) ×2 IMPLANT
GAUZE SPONGE 4X4 12PLY STRL (GAUZE/BANDAGES/DRESSINGS) ×2 IMPLANT
GLOVE BIO SURGEON STRL SZ 6.5 (GLOVE) ×2 IMPLANT
KIT CLEAN ENDO COMPLIANCE (KITS) ×6 IMPLANT
KIT ROOM TURNOVER OR (KITS) ×2 IMPLANT
MARKER SKIN DUAL TIP RULER LAB (MISCELLANEOUS) ×2 IMPLANT
NEEDLE BIOPSY TRANSBRONCH 21G (NEEDLE) IMPLANT
NEEDLE BLUNT 18X1 FOR OR ONLY (NEEDLE) IMPLANT
NEEDLE EBUS SONO TIP PENTAX (NEEDLE) ×2 IMPLANT
NS IRRIG 1000ML POUR BTL (IV SOLUTION) ×2 IMPLANT
OIL SILICONE PENTAX (PARTS (SERVICE/REPAIRS)) ×2 IMPLANT
PAD ARMBOARD 7.5X6 YLW CONV (MISCELLANEOUS) ×4 IMPLANT
SYR 20CC LL (SYRINGE) ×2 IMPLANT
SYR 20ML ECCENTRIC (SYRINGE) ×2 IMPLANT
SYR 30ML SLIP (SYRINGE) ×2 IMPLANT
SYR 3ML LL SCALE MARK (SYRINGE) ×2 IMPLANT
TOWEL OR 17X24 6PK STRL BLUE (TOWEL DISPOSABLE) ×2 IMPLANT
TRAP SPECIMEN MUCOUS 40CC (MISCELLANEOUS) ×2 IMPLANT
TUBE CONNECTING 20X1/4 (TUBING) ×2 IMPLANT
WATER STERILE IRR 1000ML POUR (IV SOLUTION) ×2 IMPLANT

## 2016-10-19 NOTE — H&P (Signed)
[address]       Tammy Bowers,Tammy Bowers             [phone]                    Tammy Bowers [facility] #[id number] Date of Birth: [birth year]  Referring: Tammy Bowers Primary Care: Tammy Amel, Bowers  Chief Complaint:    Lung cancer    History of Present Illness:    Tammy Bowers 65 y.o. female is seen in the office   for Spot on her lung. The patient had noted increasing shortness of breath coughing usually dry and nonproductive, she's noted no hemoptysis, she quit smoking 2 months ago after smoking for 40 years. A recent chest x-ray suggested a right hilar mass, follow-up CT scan was performed and she is referred to thoracic surgery office for further evaluation and treatment.    Current Activity/ Functional Status:  Patient is independent with mobility/ambulation, transfers, ADL's, IADL's.   Zubrod Score: At the time of surgery this patient's most appropriate activity status/level should be described as: '[]'$     0    Normal activity, no symptoms '[x]'$     1    Restricted in physical strenuous activity but ambulatory, able to do out light work '[]'$     2    Ambulatory and capable of self care, unable to do work activities, up and about               >50 % of waking hours                              '[]'$     3    Only limited self care, in bed greater than 50% of waking hours '[]'$     4    Completely disabled, no self care, confined to bed or chair '[]'$     5    Moribund   Past Medical History:  Diagnosis Date  . Adenopathy 10/10/2016   PERICARINAL  . Anemia   . Arthritis   . Asthma   . COPD (chronic obstructive pulmonary disease) ([place])   . Dyspnea   . Headache    migraines  . Hilar mass 10/10/2016   RIGHT  . History of kidney stones   . Insomnia   . Lung nodules 10/10/2016   RIGHT LOWER LOBE  . Mass of both adrenal glands ([place]) 10/10/2016  . Mood swing ([place])   . Pneumonia   . Substance abuse    TOBACCO  . UNSPECIFIED INFECTION OF BONE ANKLE  AND FOOT 10/22/2009   Annotation: left ankle Qualifier: Diagnosis of  By: Tammy Bowers      Past Surgical History:  Procedure Laterality Date  . ANKLE SURGERY    . APPENDECTOMY    . TONSILLECTOMY      Family History  Problem Relation Age of Onset  . Heart disease Father   . Colon cancer Father   . Heart disease Paternal Grandfather   . Ovarian cancer Mother     Social History   Social History  . Marital status: Married    Spouse name: N/A  . Number of children: 2  . Years of education: N/A   Occupational History  . retired    Social History Main Topics  . Smoking status: Former Smoker    Packs/day: 1.00    Years: 50.00    Types:  Cigarettes    Quit date: 08/02/2016  . Smokeless tobacco: Never Used  . Alcohol use No  . Drug use:     Types: Marijuana     Comment: none since Aug/Sept 2017  . Sexual activity: Yes    Partners: Male    Birth control/ protection: Post-menopausal   Other Topics Concern  . Not on file   Social History Narrative  . No narrative on file    History  Smoking Status  . Former Smoker  . Packs/day: 1.00  . Years: 50.00  . Types: Cigarettes  . Quit date: 08/02/2016  Smokeless Tobacco  . Never Used    History  Alcohol Use No     Allergies  Allergen Reactions  . Lidocaine Swelling    SWELLING REACTION UNSPECIFIED     Current Facility-Administered Medications  Medication Dose Route Frequency Provider Last Rate Last Dose  . lactated ringers infusion   Intravenous Continuous Tammy Palau, Bowers 10 mL/hr at 10/19/16 7510        Review of Systems:     Cardiac Review of Systems: Y or N  Chest Pain [  n  ]  Resting SOB [ y  ] Exertional SOB  Blue.Reese  ]  Orthopnea [ y ]   Pedal Edema [n   ]    Palpitations Florencio.Farrier  ] Syncope  [ n ]   Presyncope [ n  ]  General Review of Systems: [Y] = yes [  ]=no Constitional: recent weight change [y];  Wt loss over the last 3 months Tara.Kingdom   ] anorexia [  ]; fatigue [  ]; nausea [  ]; night sweats  [  ]; fever [ n ]; or chills [  ];          Dental: poor dentition[ y ]; Last Dentist visit:   Eye : blurred vision [  ]; diplopia [   ]; vision changes [  ];  Amaurosis fugax[  ]; Resp: cough [ y ];  wheezing[y  ];  hemoptysis[n  ]; shortness of breath[y  ]; paroxysmal nocturnal dyspnea[  ]; dyspnea on exertion[  ]; or orthopnea[  ];  GI:  gallstones[  ], vomiting[  ];  dysphagia[  ]; melena[  ];  hematochezia [  ]; heartburn[  ];   Hx of  Colonoscopy[  ]; GU: kidney stones [  ]; hematuria[  ];   dysuria [  ];  nocturia[  ];  history of     obstruction [  ]; urinary frequency [  ]             Skin: rash, swelling[  ];, hair loss[  ];  peripheral edema[  ];  or itching[  ]; Musculosketetal: myalgias[  ];  joint swelling[  ];  joint erythema[  ];  joint pain[  ];  back pain[  ];  Heme/Lymph: bruising[  ];  bleeding[  ];  anemia[  ];  Neuro: TIA[  ];  headaches[y  ];  stroke[  ];  vertigo[  ];  seizures[  ];   paresthesias[y  ];  difficulty walking[  ];  Psych:depression[  ]; anxiety[  ];  Endocrine: diabetes[ n ];  thyroid dysfunction[ n ];  Immunizations: Flu up to date [  y]; Pneumococcal up to date [n  ];  Other:  Physical Exam: BP 116/77   Pulse 92   Temp 98.2 F (36.8 C) (Oral)   Resp 18   Ht '5\' 6"'$  (1.676  m)   Wt 121 lb (54.9 kg)   SpO2 96%   BMI 19.53 kg/m   PHYSICAL EXAMINATION: General appearance: alert, cooperative, appears older than stated age and pale Head: Normocephalic, without obvious abnormality, atraumatic Neck: no adenopathy, no carotid bruit, no JVD, supple, symmetrical, trachea midline and thyroid not enlarged, symmetric, no tenderness/mass/nodules Lymph nodes: Cervical, supraclavicular, and axillary nodes normal. Resp: diminished breath sounds RLL and RML Back: symmetric, no curvature. ROM normal. No CVA tenderness. Cardio: regular rate and rhythm, S1, S2 normal, no murmur, click, rub or gallop GI: soft, non-tender; bowel sounds normal; no masses,  no  organomegaly Extremities: extremities normal, atraumatic, no cyanosis or edema and Homans sign is negative, no sign of DVT Neurologic: Grossly normal  Diagnostic Studies & Laboratory data:     Recent Radiology Findings:   Dg Chest 2 View  Result Date: 10/10/2016 CLINICAL DATA:  Cough, congestion, night sweats. EXAM: CHEST  2 VIEW COMPARISON:  02/15/2016. FINDINGS: Trachea is midline. Heart size normal. There is a right perihilar/ right lower lobe mass with associated airway narrowing. Mild volume loss at the base of the right hemi thorax. Lungs are hyperinflated but otherwise clear. No pleural fluid. IMPRESSION: Right perihilar mass is highly worrisome for primary bronchogenic carcinoma. CT chest with contrast is recommended in further evaluation. These results will be called to the ordering clinician or representative by the Radiologist Assistant, and communication documented in the PACS or zVision Dashboard. Electronically Signed   By: Tammy Picket M.D.   On: 10/10/2016 10:59   Ct Chest W Contrast  Result Date: 10/11/2016 CLINICAL DATA:  Right hilar lung mass EXAM: CT CHEST WITH CONTRAST TECHNIQUE: Multidetector CT imaging of the chest was performed during intravenous contrast administration. CONTRAST:  37m ISOVUE-300 IOPAMIDOL (ISOVUE-300) INJECTION 61% COMPARISON:  Chest radiograph of 10/10/2016 FINDINGS: Cardiovascular: Atherosclerosis of the thoracic aorta at its arch. Takeoff of the great vessels is unremarkable. Normal sized cardiac chambers. Trace anterior pericardial effusion measuring up to 9 mm on series 2, image 99. Coronary arteriosclerosis noted. Mediastinum/Nodes: Precarinal lymphadenopathy measuring 2.5 cm short axis, series 2, image 53. 11 mm short axis subcarinal lymph node, series 5 image 66. 6 mm AP window short axis lymph node series 5, image 47. No pathologically enlarged axillary or supraclavicular lymphadenopathy. Hyperdense appearing foci in the right hilum and subcarinal  region likely represent contrast within tiny vessels. Small calcified lymph nodes are not entirely excluded. Lungs/Pleura: There is an approximately 5.7 x 4.8 x 6.1 cm right hilar mass with mass effect on the distal right pulmonary artery causing narrowing but without complete occlusion. Smaller tiny satellite nodules are seen in the right lower lobe, series 5, image 84 measuring up to 5 mm. More irregular masslike opacities adjacent to the right hilar mass, series 5, image 77 and series 3, image 104 likely represent additional metastatic lesions and/or postobstructive consolidation. This measures approximately 3.1 x 2.7 x 2.7 cm in the superior segment right lower lobe. There is marked narrowing of the right lower lobe bronchus with postobstructive airspace disease to the right lower lobe. There is right middle lobe collapse with volume loss and slight mediastinal shift to the right as a result. Within the right mainstem bronchus is a polypoid soft tissue density suspicious for endobronchial invasion, series 5, image 63 measuring approximately 7 mm. No pleural effusion. Minimal right lower lobe dependent atelectasis. No pleural effusion. Upper Abdomen: 2.8 x 2.6 x 3 cm right adrenal mass with Hounsfield unit of 35 post IV  contrast. Left adrenal gland is slightly nodular at the apex as well measuring up to 9 mm in diameter. Musculoskeletal: No lytic or blastic appearing abnormality. IMPRESSION: Right hilar 5.7 x 4.8 x 6.1 cm mass causing mass effect on the distal right pulmonary artery and right lower lobe bronchus and causing right middle lobe collapse. There are smaller nodular as well as ill-defined satellite lesions in the superior segment of the right lower lobe, right precarinal lymphadenopathy and bilateral adrenal masses consistent with metastasis. Electronically Signed   By: Tammy Royalty M.D.   On: 10/11/2016 15:36     I have independently reviewed the above radiologic studies.  Recent Lab  Findings: Lab Results  Component Value Date   WBC 8.6 10/19/2016   HGB 14.6 10/19/2016   HCT 44.0 10/19/2016   PLT 307 10/19/2016   GLUCOSE 88 10/22/2009   NA 141 10/22/2009   K 4.1 10/22/2009   CL 106 10/22/2009   CREATININE 0.76 10/22/2009   BUN 13 10/22/2009   CO2 27 10/22/2009   INR 1.12 10/22/2009      Assessment / Plan:   Patient with clinical stage IV carcinoma of the lung if the adrenal mass represent tumor. I discussed with the patient and her husband the probable diagnosis of advanced stage lung cancer. I recommended that we proceed as soon as possible with obtaining a tissue diagnosis, with bronchoscopy ebus. We'll also obtain a brain scan and PET scan. The patient will be referred to the multidisciplinary thoracic oncology clinic as soon as possible. Tentative plan for biopsy on Tuesday, October 31 and hopefully can obtain brain scan prior to this. Patient husband had their questions answered.    The goals risks and alternatives of the planned surgical procedure  bronchoscopy ebus with biopsy have been discussed with the patient in detail. The risks of the procedure including death, infection, stroke, myocardial infarction, bleeding, blood transfusion have all been discussed specifically.  I have quoted Tammy Bowers a 2 % of perioperative mortality and a complication rate as high as 10 %. The patient's questions have been answered.Tammy Bowers is willing  to proceed with the planned procedure.   Tammy Bowers      [address] [place] [address] Office [phone]   Beeper [phone]  10/19/2016 10:11 AM

## 2016-10-19 NOTE — Discharge Instructions (Addendum)
Flexible Bronchoscopy, Care After Refer to this sheet in the next few weeks. These instructions provide you with information on caring for yourself after your procedure. Your health care provider may also give you more specific instructions. Your treatment has been planned according to current medical practices, but problems sometimes occur. Call your health care provider if you have any problems or questions after your procedure.  WHAT TO EXPECT AFTER THE PROCEDURE It is normal to have the following symptoms for 24-48 hours after the procedure:   Increased cough.  Low-grade fever.  Sore throat or hoarse voice.  Small streaks of blood in your thick spit (sputum) if tissue samples were taken (biopsy). HOME CARE INSTRUCTIONS   Do not eat or drink anything for 2 hours after your procedure. Your nose and throat were numbed by medicine. If you try to eat or drink before the medicine wears off, food or drink could go into your lungs or you could burn yourself. After the numbness is gone and your cough and gag reflexes have returned, you may eat soft food and drink liquids slowly.   The day after the procedure, you can go back to your normal diet.   You may resume normal activities.   Keep all follow-up visits as directed by your health care provider. It is important to keep all your appointments, especially if tissue samples were taken for testing (biopsy). SEEK IMMEDIATE MEDICAL CARE IF:   You have increasing shortness of breath.   You become light-headed or faint.   You have chest pain.   You have any new concerning symptoms.  You cough up more than a small amount of blood.  The amount of blood you cough up increases. MAKE SURE YOU:  Understand these instructions.  Will watch your condition.  Will get help right away if you are not doing well or get worse.   This information is not intended to replace advice given to you by your health care provider. Make sure you discuss  any questions you have with your health care provider.   Document Released: 06/24/2005 Document Revised: 12/26/2014 Document Reviewed: 08/09/2013 Elsevier Interactive Patient Education [employer].

## 2016-10-19 NOTE — Progress Notes (Signed)
Lab called blood is hemolyzed and will need to be recollected. Our lab called and informed.

## 2016-10-19 NOTE — Brief Op Note (Signed)
      [address]       [place] [address]             [phone]      10/19/2016  12:22 PM  PATIENT:  Tammy Bowers  65 y.o. female  PRE-OPERATIVE DIAGNOSIS:  LUNG MASS right  POST-OPERATIVE DIAGNOSIS: same non small cell lung cancer - final pending   PROCEDURE:  Procedure(s): VIDEO BRONCHOSCOPY WITH ENDOBRONCHIAL ULTRASOUND (N/A) LUNG BIOPSY (N/A)  SURGEON:  Surgeon(s) and Role:    * Grace Isaac, MD - Primary   ANESTHESIA:   general  EBL:  Total I/O In: 800 [I.V.:800] Out: 0   BLOOD ADMINISTERED:none  DRAINS: none   LOCAL MEDICATIONS USED:  NONE  SPECIMEN:  Source of Specimen:  #7 node and right lung mass  DISPOSITION OF SPECIMEN:  PATHOLOGY  COUNTS:  YES  DICTATION: .Dragon Dictation  PLAN OF CARE: Discharge to home after PACU  PATIENT DISPOSITION:  PACU - hemodynamically stable.   Delay start of Pharmacological VTE agent (>24hrs) due to surgical blood loss or risk of bleeding: yes

## 2016-10-19 NOTE — Anesthesia Postprocedure Evaluation (Signed)
Anesthesia Post Note  Patient: Tammy Bowers  Procedure(s) Performed: Procedure(s) (LRB): VIDEO BRONCHOSCOPY WITH ENDOBRONCHIAL ULTRASOUND (N/A) LUNG BIOPSY (N/A)  Patient location during evaluation: PACU Anesthesia Type: General Level of consciousness: awake and alert Pain management: pain level controlled Vital Signs Assessment: post-procedure vital signs reviewed and stable Respiratory status: spontaneous breathing, nonlabored ventilation and respiratory function stable Cardiovascular status: blood pressure returned to baseline and stable Postop Assessment: no signs of nausea or vomiting Anesthetic complications: no    Last Vitals:  Vitals:   10/19/16 1330 10/19/16 1357  BP:  124/66  Pulse:  84  Resp:  16  Temp: 36.7 C     Last Pain:  Vitals:   10/19/16 1227  TempSrc:   PainSc: 0-No pain                 Chrissie Dacquisto,W. EDMOND

## 2016-10-19 NOTE — Anesthesia Preprocedure Evaluation (Addendum)
Anesthesia Evaluation  Patient identified by MRN, date of birth, ID band Patient awake    Reviewed: Allergy & Precautions, H&P , NPO status , Patient's Chart, lab work & pertinent test results  Airway Mallampati: II  TM Distance: >3 FB Neck ROM: Full    Dental no notable dental hx. (+) Teeth Intact, Dental Advisory Given   Pulmonary asthma , COPD,  COPD inhaler, former smoker,    Pulmonary exam normal breath sounds clear to auscultation       Cardiovascular negative cardio ROS   Rhythm:Regular Rate:Normal     Neuro/Psych  Headaches, negative psych ROS   GI/Hepatic negative GI ROS, Neg liver ROS,   Endo/Other  negative endocrine ROS  Renal/GU negative Renal ROS  negative genitourinary   Musculoskeletal  (+) Arthritis , Osteoarthritis,    Abdominal   Peds  Hematology negative hematology ROS (+) anemia ,   Anesthesia Other Findings   Reproductive/Obstetrics negative OB ROS                            Anesthesia Physical Anesthesia Plan  ASA: III  Anesthesia Plan: General   Post-op Pain Management:    Induction: Intravenous  Airway Management Planned: Oral ETT  Additional Equipment:   Intra-op Plan:   Post-operative Plan: Extubation in OR  Informed Consent: I have reviewed the patients History and Physical, chart, labs and discussed the procedure including the risks, benefits and alternatives for the proposed anesthesia with the patient or authorized representative who has indicated his/her understanding and acceptance.   Dental advisory given  Plan Discussed with: CRNA  Anesthesia Plan Comments:         Anesthesia Quick Evaluation

## 2016-10-19 NOTE — Anesthesia Procedure Notes (Signed)
Procedure Name: Intubation Date/Time: 10/19/2016 11:27 AM Performed by: Izora Gala Pre-anesthesia Checklist: Patient identified, Emergency Drugs available, Suction available and Patient being monitored Patient Re-evaluated:Patient Re-evaluated prior to inductionOxygen Delivery Method: Circle system utilized Preoxygenation: Pre-oxygenation with 100% oxygen Intubation Type: IV induction Ventilation: Mask ventilation without difficulty Laryngoscope Size: Miller and 3 Grade View: Grade I Tube type: Oral Tube size: 8.5 mm Number of attempts: 1 Placement Confirmation: ETT inserted through vocal cords under direct vision,  positive ETCO2 and breath sounds checked- equal and bilateral

## 2016-10-19 NOTE — Transfer of Care (Signed)
Immediate Anesthesia Transfer of Care Note  Patient: Tammy Bowers  Procedure(s) Performed: Procedure(s): VIDEO BRONCHOSCOPY WITH ENDOBRONCHIAL ULTRASOUND (N/A) LUNG BIOPSY (N/A)  Patient Location: PACU  Anesthesia Type:General  Level of Consciousness: awake, alert , oriented and patient cooperative  Airway & Oxygen Therapy: Patient Spontanous Breathing and Patient connected to nasal cannula oxygen  Post-op Assessment: Report given to RN, Post -op Vital signs reviewed and stable and Patient moving all extremities  Post vital signs: Reviewed and stable  Last Vitals:  Vitals:   10/19/16 0853  BP: 116/77  Pulse: 92  Resp: 18  Temp: 36.8 C    Last Pain:  Vitals:   10/19/16 0853  TempSrc: Oral      Patients Stated Pain Goal: 8 (25/36/64 4034)  Complications: No apparent anesthesia complications

## 2016-10-19 NOTE — Telephone Encounter (Signed)
Oncology Nurse Navigator Documentation  Oncology Nurse Navigator Flowsheets 10/19/2016  Navigator Location [facility]  Navigator Encounter Type Introductory phone call;Telephone/I received referral form Dr. Servando Snare.  I called patient and spoke with husband.  I gave him appt time and place for [facility] 10/27/16 arrive at 1:30.  He verbalized understanding of appt time and place.   Telephone Outgoing Call  Treatment Phase Pre-Tx/Tx Discussion  Barriers/Navigation Needs Coordination of Care  Interventions Coordination of Care  Coordination of Care Appts  Acuity Level 1  Acuity Level 1 Initial guidance, education and coordination as needed  Time Spent with Patient 30

## 2016-10-20 ENCOUNTER — Encounter (HOSPITAL_COMMUNITY): Payer: Self-pay | Admitting: Cardiothoracic Surgery

## 2016-10-21 ENCOUNTER — Telehealth: Payer: Self-pay | Admitting: *Deleted

## 2016-10-21 ENCOUNTER — Ambulatory Visit (HOSPITAL_COMMUNITY)
Admission: RE | Admit: 2016-10-21 | Discharge: 2016-10-21 | Disposition: A | Discharge status: Home / Self Care | Payer: BLUE CROSS/BLUE SHIELD | Source: Ambulatory Visit | Attending: Cardiothoracic Surgery | Admitting: Cardiothoracic Surgery

## 2016-10-21 DIAGNOSIS — R918 Other nonspecific abnormal finding of lung field: Secondary | ICD-10-CM | POA: Insufficient documentation

## 2016-10-21 DIAGNOSIS — R911 Solitary pulmonary nodule: Secondary | ICD-10-CM | POA: Diagnosis not present

## 2016-10-21 DIAGNOSIS — D3501 Benign neoplasm of right adrenal gland: Secondary | ICD-10-CM | POA: Insufficient documentation

## 2016-10-21 LAB — GLUCOSE, CAPILLARY: Glucose-Capillary: 87 mg/dL (ref 65–99)

## 2016-10-21 MED ORDER — FLUDEOXYGLUCOSE F - 18 (FDG) INJECTION
10.5400 | Freq: Once | INTRAVENOUS | Status: AC | PRN
  Administered 2016-10-21: 10.54 via INTRAVENOUS

## 2016-10-21 NOTE — Telephone Encounter (Signed)
Mailed [facility] letter to pt.  

## 2016-10-21 NOTE — Op Note (Signed)
NAMEFLORINE, SPRENKLE NO.:  [id number]  MEDICAL RECORD NO.:  [id number]  LOCATION:  [facility]                         FACILITY:  [facility]  PHYSICIAN:  Lanelle Bal, MD    DATE OF BIRTH:  11-27-1951  DATE OF PROCEDURE:  10/19/2016 DATE OF DISCHARGE:  10/19/2016                              OPERATIVE REPORT   PREOPERATIVE DIAGNOSIS:  Carcinoma of the right lung with involvement of the mediastinum.  POSTOPERATIVE DIAGNOSIS:  Carcinoma of the right lung with involvement of the mediastinum.  SURGICAL PROCEDURE:  Bronchoscopy with endobronchial ultrasound and biopsy of right lung mass and #7 lymph nodes.  SURGEON:  Lanelle Bal, M.D.  BRIEF HISTORY:  The patient is a 65 year old female with long history of smoking who presents with increasing cough.  Chest x-ray was obtained as was a CT scan suggestive of markedly enlarged mediastinal nodes and right lung mass involving narrowing of the upper lobe and bronchus intermedius and partial lower lobe.  Because of the patient's possible impending obstruction of her airways on the right, we decided to proceed with bronchoscopy and biopsy to obtain a tissue diagnosis as soon as possible to facilitate proceeding with treatment.  An MRI of the brain will be done.  Risks and options were discussed with the patient in detail and she was agreeable and signed informed consent.  DESCRIPTION OF PROCEDURE:  The patient was placed in supine position and underwent general endotracheal anesthesia without incident.  Appropriate time-out was performed, and we proceeded with the standard 2.8-mm fiberoptic bronchoscope and formal bronchoscopy was performed.  The mainstem bronchus was clear of disease, the left tracheobronchial tree was clear of disease to the subsegmental level.  In the right tracheobronchial tree, the inferior portion of the right upper lobe bronchus had obvious extrinsic compression and mass effect.  There was a mass  growing from the bronchus intermedius totally obstructing it. There was some narrowing in the lower lobe bronchus.  We then removed the standard bronchoscope and passed the EBUS scope and easily located a very large #7 lymph node.  Multiple transbronchial biopsies were done on this area.  The initial smear was consistent with malignancy.  We then made sure that we obtained enough tissue for pathologic examination and molecular testing if appropriate.  The standard bronchoscope was replaced.  Brushings and biopsies were obtained on the bronchus intermedius area.  Blood loss was minimal.  The scope was removed.  The patient was extubated in the operating room and transferred to the recovery room for further postoperative care having tolerated the procedure without obvious complication.  Following this, the patient will obtain a PET scan and MRI of the brain, and has arrangement for her to be seen in the Oncology Clinic.     Lanelle Bal, MD     EG/MEDQ  D:  10/20/2016  T:  10/21/2016  Job:  [id number]

## 2016-10-25 ENCOUNTER — Telehealth: Payer: Self-pay | Admitting: *Deleted

## 2016-10-25 ENCOUNTER — Ambulatory Visit (HOSPITAL_COMMUNITY): Payer: BLUE CROSS/BLUE SHIELD

## 2016-10-25 NOTE — Telephone Encounter (Signed)
Oncology Nurse Navigator Documentation  Oncology Nurse Navigator Flowsheets 10/25/2016  Navigator Location [facility]  Navigator Encounter Type Telephone/I called central scheduling to get Tammy Bowers's MRI Brain scheduled.  I was given an appt for today arrive at 3:45.  I called Tammy Bowers and left her a vm message regarding appt and to call me if needing to change it.   Telephone Outgoing Call  Treatment Phase Pre-Tx/Tx Discussion  Barriers/Navigation Needs Coordination of Care  Interventions Coordination of Care  Coordination of Care Appts;Radiology  Acuity Level 2  Acuity Level 2 Assistance expediting appointments  Time Spent with Patient 15

## 2016-10-27 ENCOUNTER — Ambulatory Visit (HOSPITAL_BASED_OUTPATIENT_CLINIC_OR_DEPARTMENT_OTHER): Payer: BLUE CROSS/BLUE SHIELD | Admitting: Internal Medicine

## 2016-10-27 ENCOUNTER — Other Ambulatory Visit (HOSPITAL_BASED_OUTPATIENT_CLINIC_OR_DEPARTMENT_OTHER): Payer: BLUE CROSS/BLUE SHIELD

## 2016-10-27 ENCOUNTER — Ambulatory Visit
Admission: RE | Admit: 2016-10-27 | Discharge: 2016-10-27 | Disposition: A | Discharge status: Home / Self Care | Payer: BLUE CROSS/BLUE SHIELD | Source: Ambulatory Visit | Attending: Radiation Oncology | Admitting: Radiation Oncology

## 2016-10-27 ENCOUNTER — Ambulatory Visit: Payer: BLUE CROSS/BLUE SHIELD | Attending: Internal Medicine | Admitting: Physical Therapy

## 2016-10-27 ENCOUNTER — Encounter: Payer: Self-pay | Admitting: *Deleted

## 2016-10-27 ENCOUNTER — Encounter: Payer: Self-pay | Admitting: Internal Medicine

## 2016-10-27 DIAGNOSIS — C3491 Malignant neoplasm of unspecified part of right bronchus or lung: Secondary | ICD-10-CM | POA: Diagnosis not present

## 2016-10-27 DIAGNOSIS — R112 Nausea with vomiting, unspecified: Secondary | ICD-10-CM

## 2016-10-27 DIAGNOSIS — C3490 Malignant neoplasm of unspecified part of unspecified bronchus or lung: Secondary | ICD-10-CM | POA: Insufficient documentation

## 2016-10-27 DIAGNOSIS — R634 Abnormal weight loss: Secondary | ICD-10-CM | POA: Diagnosis not present

## 2016-10-27 DIAGNOSIS — M545 Low back pain, unspecified: Secondary | ICD-10-CM

## 2016-10-27 DIAGNOSIS — R918 Other nonspecific abnormal finding of lung field: Secondary | ICD-10-CM

## 2016-10-27 DIAGNOSIS — Z9181 History of falling: Secondary | ICD-10-CM | POA: Diagnosis not present

## 2016-10-27 DIAGNOSIS — R2681 Unsteadiness on feet: Secondary | ICD-10-CM

## 2016-10-27 DIAGNOSIS — J449 Chronic obstructive pulmonary disease, unspecified: Secondary | ICD-10-CM

## 2016-10-27 DIAGNOSIS — Z5111 Encounter for antineoplastic chemotherapy: Secondary | ICD-10-CM | POA: Insufficient documentation

## 2016-10-27 LAB — COMPREHENSIVE METABOLIC PANEL
ALT: 12 U/L (ref 0–55)
AST: 18 U/L (ref 5–34)
Albumin: 2.9 g/dL — ABNORMAL LOW (ref 3.5–5.0)
Alkaline Phosphatase: 111 U/L (ref 40–150)
Anion Gap: 9 mEq/L (ref 3–11)
BUN: 15.2 mg/dL (ref 7.0–26.0)
CO2: 25 mEq/L (ref 22–29)
Calcium: 9.4 mg/dL (ref 8.4–10.4)
Chloride: 108 mEq/L (ref 98–109)
Creatinine: 0.8 mg/dL (ref 0.6–1.1)
EGFR: 77 mL/min/{1.73_m2} — ABNORMAL LOW (ref >90)
Glucose: 93 mg/dl (ref 70–140)
Potassium: 4.5 mEq/L (ref 3.5–5.1)
Sodium: 142 mEq/L (ref 136–145)
Total Bilirubin: 0.37 mg/dL (ref 0.20–1.20)
Total Protein: 6.8 g/dL (ref 6.4–8.3)

## 2016-10-27 LAB — CBC WITH DIFFERENTIAL/PLATELET
BASO%: 0.7 % (ref 0.0–2.0)
Basophils Absolute: 0.1 10*3/uL (ref 0.0–0.1)
EOS%: 1.5 % (ref 0.0–7.0)
Eosinophils Absolute: 0.2 10*3/uL (ref 0.0–0.5)
HCT: 39.7 % (ref 34.8–46.6)
HGB: 13.1 g/dL (ref 11.6–15.9)
LYMPH%: 13.3 % — ABNORMAL LOW (ref 14.0–49.7)
MCH: 28.3 pg (ref 25.1–34.0)
MCHC: 33 g/dL (ref 31.5–36.0)
MCV: 85.6 fL (ref 79.5–101.0)
MONO#: 0.9 10*3/uL (ref 0.1–0.9)
MONO%: 7.7 % (ref 0.0–14.0)
NEUT#: 8.9 10*3/uL — ABNORMAL HIGH (ref 1.5–6.5)
NEUT%: 76.8 % (ref 38.4–76.8)
Platelets: 319 10*3/uL (ref 145–400)
RBC: 4.64 10*6/uL (ref 3.70–5.45)
RDW: 13.1 % (ref 11.2–14.5)
WBC: 11.6 10*3/uL — ABNORMAL HIGH (ref 3.9–10.3)
lymph#: 1.5 10*3/uL (ref 0.9–3.3)

## 2016-10-27 NOTE — Progress Notes (Signed)
[place] [phone]   [phone] Multidisciplinary thoracic oncology clinic  CONSULT NOTE  REFERRING PHYSICIAN: Dr. Lanelle Bal  REASON FOR CONSULTATION:  65 years old white female recently diagnosed with lung cancer.  HPI Tammy Bowers is a 65 y.o. female was past medical history significant for COPD, anemia, osteoarthritis, kidney stone, migraine headache as well as long history of smoking. The patient has been complaining of dry cough for the last 2 months. She was seen by her primary care physician and chest x-ray was performed on 10/10/2016 and it showed right perihilar mass highly worrisome for primary bronchogenic carcinoma. This was followed by CT scan of the chest on 10/08/2016 and it showed an approximately 5.7 x 4.8 x 6.1 cm right hilar mass with mass effect on the distal right pulmonary artery causing narrowing but without complete occlusion. There was a smaller tiny satellite nodule in the right lower lobe measuring up to 5 mm. There is also more irregular masslike opacity adjacent to the right hilar mass likely representing an additional metastatic lesion and/or postobstructive consolidation and it measured 3.1 x 2.7 x 2.7 cm in the superior segment of the right lower lobe with marked narrowing of the right lower lobe bronchus and postobstructive a space disease to the right lower lobe. There is also right middle lobe collapse was volume loss and slight mediastinal shift to the right. The upper abdomen showed 2.8 x 2.6 x 3.0 cm right adrenal mass. The patient was referred to Dr. Servando Snare and on 10/19/2016 she underwent bronchoscopy with endobronchial ultrasound and biopsy of the right lung mass as well as level 7 lymph nodes. The final pathology biopsy of the bronchus intermedius ([id number]) was consistent with a small cell carcinoma. A PET scan on 10/21/2016 showed the large hypermetabolic right upper lobe lung mass invading the right hilum  and mediastinum and partially obstructing the right middle and lower lobe bronchi. There was distal obstructive pneumonia versus interstitial spread of tumor in the right lower lobe. There was indeterminate 0.6 cm right upper lobe pulmonary nodule and no findings for abdominal/pelvic metastatic disease or osseous metastatic disease. There was benign-appearing right adrenal gland adenoma. The patient also had MRI of the brain performed in [place] because of the cheaper cost but unfortunately the final report is not available at this point. Dr. Servando Snare kindly referred the patient to the multidisciplinary thoracic oncology clinic today for further evaluation and recommendation regarding treatment of her condition. When seen today she continues to complain of shortness of breath as well as dry cough with mild chest pain and no hemoptysis. She has occasional nausea with no vomiting. She lost around 25 pounds in the last 4 months. She also has history of migraine headache and no visual changes. Family history significant for father with colon cancer and mother had ovarian cancer. The patient is married and was accompanied today by her husband Jenny Reichmann. She has 2 sons Jenny Reichmann and [place]. She is currently retired. She has a history of smoking up to 2 pack per day for around 40 years but quit recently. She has no history of alcohol or drug abuse except marijuana.  HPI  Past Medical History:  Diagnosis Date  . Adenopathy 10/10/2016   PERICARINAL  . Anemia   . Arthritis   . Asthma   . COPD (chronic obstructive pulmonary disease) ([place])   . Dyspnea   . Headache    migraines  . Hilar mass 10/10/2016   RIGHT  .  History of kidney stones   . Insomnia   . Lung nodules 10/10/2016   RIGHT LOWER LOBE  . Mass of both adrenal glands ([place]) 10/10/2016  . Mood swing ([place])   . Pneumonia   . Substance abuse    TOBACCO  . UNSPECIFIED INFECTION OF BONE ANKLE AND FOOT 10/22/2009   Annotation: left ankle Qualifier:  Diagnosis of  By: Gustavo Lah      Past Surgical History:  Procedure Laterality Date  . ANKLE SURGERY    . APPENDECTOMY    . LUNG BIOPSY N/A 10/19/2016   Procedure: LUNG BIOPSY;  Surgeon: Grace Isaac, MD;  Location: [facility];  Service: Thoracic;  Laterality: N/A;  . TONSILLECTOMY    . VIDEO BRONCHOSCOPY WITH ENDOBRONCHIAL ULTRASOUND N/A 10/19/2016   Procedure: VIDEO BRONCHOSCOPY WITH ENDOBRONCHIAL ULTRASOUND;  Surgeon: Grace Isaac, MD;  Location: [facility];  Service: Thoracic;  Laterality: N/A;    Family History  Problem Relation Age of Onset  . Heart disease Father   . Colon cancer Father   . Heart disease Paternal Grandfather   . Ovarian cancer Mother     Social History Social History  Substance Use Topics  . Smoking status: Former Smoker    Packs/day: 1.00    Years: 50.00    Types: Cigarettes    Quit date: 08/02/2016  . Smokeless tobacco: Never Used  . Alcohol use No    Allergies  Allergen Reactions  . Lidocaine Swelling    SWELLING REACTION UNSPECIFIED     Current Outpatient Prescriptions  Medication Sig Dispense Refill  . albuterol (PROVENTIL HFA;VENTOLIN HFA) 108 (90 BASE) MCG/ACT inhaler Inhale 2 puffs into the lungs every 4 (four) hours as needed for wheezing.    [place]land Kitchen aspirin 325 MG EC tablet Take 325 mg by mouth 3 (three) times daily as needed for pain.    [place] Kitchen Fluticasone-Salmeterol (ADVAIR) 500-50 MCG/DOSE AEPB Inhale 1 puff into the lungs 2 (two) times daily.    . Misc Natural Products (GLUCOSAMINE CHOND COMPLEX/MSM PO) Take 1 tablet by mouth every other day.    . Multiple Vitamin (MULTIVITAMIN WITH MINERALS) TABS tablet Take 1 tablet by mouth every other day. Women's Multivitamin     . Omega-3 Fatty Acids (FISH OIL) 1000 MG CAPS Take 1,000 mg by mouth every other day.    . tiotropium (SPIRIVA) 18 MCG inhalation capsule Place 18 mcg into inhaler and inhale daily.     No current facility-administered medications for this visit.     Review of  Systems  Constitutional: positive for fatigue and weight loss Eyes: negative Ears, nose, mouth, throat, and face: negative Respiratory: positive for cough, dyspnea on exertion and pleurisy/chest pain Cardiovascular: negative Gastrointestinal: negative Genitourinary:negative Integument/breast: negative Hematologic/lymphatic: negative Musculoskeletal:negative Neurological: negative Behavioral/Psych: negative Endocrine: negative Allergic/Immunologic: negative  Physical Exam  IRS:WNIOE, healthy, no distress, well nourished, well developed and anxious SKIN: skin color, texture, turgor are normal, no rashes or significant lesions HEAD: Normocephalic, No masses, lesions, tenderness or abnormalities EYES: normal, PERRLA, Conjunctiva are pink and non-injected EARS: External ears normal, Canals clear OROPHARYNX:no exudate, no erythema and lips, buccal mucosa, and tongue normal  NECK: supple, no adenopathy, no JVD LYMPH:  no palpable lymphadenopathy, no hepatosplenomegaly BREAST:not examined LUNGS: prolonged expiratory phase, expiratory wheezes bilaterally HEART: regular rate & rhythm, no murmurs and no gallops ABDOMEN:abdomen soft, non-tender, normal bowel sounds and no masses or organomegaly BACK: Back symmetric, no curvature., No CVA tenderness EXTREMITIES:no joint deformities, effusion, or inflammation, no edema,  no skin discoloration  NEURO: alert & oriented x 3 with fluent speech, no focal motor/sensory deficits  PERFORMANCE STATUS: ECOG 1  LABORATORY DATA: Lab Results  Component Value Date   WBC 11.6 (H) 10/27/2016   HGB 13.1 10/27/2016   HCT 39.7 10/27/2016   MCV 85.6 10/27/2016   PLT 319 10/27/2016      Chemistry      Component Value Date/Time   NA 142 10/27/2016 1350   K 4.5 10/27/2016 1350   CL 110 10/19/2016 1001   CO2 25 10/27/2016 1350   BUN 15.2 10/27/2016 1350   CREATININE 0.8 10/27/2016 1350      Component Value Date/Time   CALCIUM 9.4 10/27/2016 1350    ALKPHOS 111 10/27/2016 1350   AST 18 10/27/2016 1350   ALT 12 10/27/2016 1350   BILITOT 0.37 10/27/2016 1350       RADIOGRAPHIC STUDIES: Dg Chest 2 View  Result Date: 10/19/2016 CLINICAL DATA:  Bronchoscopy.  Malignancy. EXAM: CHEST  2 VIEW COMPARISON:  CT 10/11/2016. FINDINGS: Right hilar mass again noted. Right base atelectasis again noted. No pleural effusion or pneumothorax. Heart size normal. Thoracic spine degenerative change. IMPRESSION: Persistent right hilar mass and right base atelectasis. Electronically Signed   By: Marcello Moores  Register   On: 10/19/2016 09:09   Dg Chest 2 View  Result Date: 10/10/2016 CLINICAL DATA:  Cough, congestion, night sweats. EXAM: CHEST  2 VIEW COMPARISON:  02/15/2016. FINDINGS: Trachea is midline. Heart size normal. There is a right perihilar/ right lower lobe mass with associated airway narrowing. Mild volume loss at the base of the right hemi thorax. Lungs are hyperinflated but otherwise clear. No pleural fluid. IMPRESSION: Right perihilar mass is highly worrisome for primary bronchogenic carcinoma. CT chest with contrast is recommended in further evaluation. These results will be called to the ordering clinician or representative by the Radiologist Assistant, and communication documented in the PACS or zVision Dashboard. Electronically Signed   By: Lorin Picket M.D.   On: 10/10/2016 10:59   Ct Chest W Contrast  Result Date: 10/11/2016 CLINICAL DATA:  Right hilar lung mass EXAM: CT CHEST WITH CONTRAST TECHNIQUE: Multidetector CT imaging of the chest was performed during intravenous contrast administration. CONTRAST:  73m ISOVUE-300 IOPAMIDOL (ISOVUE-300) INJECTION 61% COMPARISON:  Chest radiograph of 10/10/2016 FINDINGS: Cardiovascular: Atherosclerosis of the thoracic aorta at its arch. Takeoff of the great vessels is unremarkable. Normal sized cardiac chambers. Trace anterior pericardial effusion measuring up to 9 mm on series 2, image 99. Coronary  arteriosclerosis noted. Mediastinum/Nodes: Precarinal lymphadenopathy measuring 2.5 cm short axis, series 2, image 53. 11 mm short axis subcarinal lymph node, series 5 image 66. 6 mm AP window short axis lymph node series 5, image 47. No pathologically enlarged axillary or supraclavicular lymphadenopathy. Hyperdense appearing foci in the right hilum and subcarinal region likely represent contrast within tiny vessels. Small calcified lymph nodes are not entirely excluded. Lungs/Pleura: There is an approximately 5.7 x 4.8 x 6.1 cm right hilar mass with mass effect on the distal right pulmonary artery causing narrowing but without complete occlusion. Smaller tiny satellite nodules are seen in the right lower lobe, series 5, image 84 measuring up to 5 mm. More irregular masslike opacities adjacent to the right hilar mass, series 5, image 77 and series 3, image 104 likely represent additional metastatic lesions and/or postobstructive consolidation. This measures approximately 3.1 x 2.7 x 2.7 cm in the superior segment right lower lobe. There is marked narrowing of the right lower lobe  bronchus with postobstructive airspace disease to the right lower lobe. There is right middle lobe collapse with volume loss and slight mediastinal shift to the right as a result. Within the right mainstem bronchus is a polypoid soft tissue density suspicious for endobronchial invasion, series 5, image 63 measuring approximately 7 mm. No pleural effusion. Minimal right lower lobe dependent atelectasis. No pleural effusion. Upper Abdomen: 2.8 x 2.6 x 3 cm right adrenal mass with Hounsfield unit of 35 post IV contrast. Left adrenal gland is slightly nodular at the apex as well measuring up to 9 mm in diameter. Musculoskeletal: No lytic or blastic appearing abnormality. IMPRESSION: Right hilar 5.7 x 4.8 x 6.1 cm mass causing mass effect on the distal right pulmonary artery and right lower lobe bronchus and causing right middle lobe collapse.  There are smaller nodular as well as ill-defined satellite lesions in the superior segment of the right lower lobe, right precarinal lymphadenopathy and bilateral adrenal masses consistent with metastasis. Electronically Signed   By: Ashley Royalty M.D.   On: 10/11/2016 15:36   Nm Pet Image Initial (pi) Skull Base To Thigh  Result Date: 10/21/2016 CLINICAL DATA:  Initial treatment strategy for lung mass. EXAM: NUCLEAR MEDICINE PET SKULL BASE TO THIGH TECHNIQUE: 10.54 mCi F-18 FDG was injected intravenously. Full-ring PET imaging was performed from the skull base to thigh after the radiotracer. CT data was obtained and used for attenuation correction and anatomic localization. FASTING BLOOD GLUCOSE:  Value: 87 mg/dl COMPARISON:  Chest CT 10/11/2016 FINDINGS: NECK No hypermetabolic lymph nodes in the neck. CHEST Large right infrahilar mass is markedly hypermetabolic with SUV max of 82.9. This is consistent with neoplasm invading the right hilum. More distally in the right lower lobe is a confluent area of airspace disease which could be postobstructive pneumonia or tumor. SUV max in this area is 14.8. Bulky precarinal lymphadenopathy is also markedly hypermetabolic with SUV max of 93.7. 6 mm pulmonary nodule in the right upper lobe on image number 16 is weakly hypermetabolic with SUV max of 3.0. Moderate right middle lobe atelectasis with calcified granulomas and associated calcified mediastinal nodes. ABDOMEN/PELVIS No abnormal hypermetabolic activity within the liver, pancreas, adrenal glands, or spleen. No hypermetabolic lymph nodes in the abdomen or pelvis. The right adrenal gland lesion is not hypermetabolic and measures 8 Hounsfield units consistent with a benign adenoma. SKELETON No focal hypermetabolic activity to suggest skeletal metastasis. IMPRESSION: 1. Large hypermetabolic right upper lobe lung mass invading the right hilum and mediastinum and partially obstructing the right middle and lower lobe  bronchi. Distal obstructive pneumonia versus interstitial spread of tumor in the right lower lobe. 2. Indeterminate 6 mm right upper lobe pulmonary nodule. This needs observation. 3. No findings for abdominal/pelvic metastatic disease or osseous metastatic disease. 4. Benign-appearing right adrenal gland adenoma. Electronically Signed   By: Marijo Sanes M.D.   On: 10/21/2016 17:02    ASSESSMENT: This is a very pleasant 65 years old white female recently diagnosed with limited stage (T2b, N2, M0) small cell lung cancer presented with large right hilar mass with mediastinal invasion as well as right lower lobe pulmonary nodule diagnosed in November 2017.   PLAN: I had a lengthy discussion with the patient and her husband today about her current disease stage, prognosis and treatment options. I reviewed the images of the MRI of the brain and I don't see clear evidence for metastatic disease to the brain but the final report is still pending. I also showed the  patient the images of the PET scan. I discussed with the patient her treatment options and recommended for her course of systemic chemotherapy with cisplatin 60 MG/M2 on day 1 and etoposide 120 MG/M2 on days 1, 2 and 3 every 3 weeks. This will be concurrent with radiotherapy starting during the first or second cycle of her chemotherapy. I discussed with the patient adverse effect of this treatment including but not limited to alopecia, myelosuppression, nausea and vomiting, peripheral neuropathy, liver or renal dysfunction. She is expected to start the first dose of this treatment on 11/01/2016. After completion of the course of systemic chemotherapy with the patient may be considered for prophylactic cranial irradiation. The patient would see Dr. Tammi Klippel later today for evaluation and discussion of the radiotherapy option. I will arrange for the patient to have a chemotherapy education class before starting the first dose of her treatment. She will  come back for follow-up visit in 2 weeks for evaluation and management of any adverse effect of her chemotherapy. I will call her pharmacy with prescription for Compazine 10 mg by mouth every 6 hours as needed for nausea. The patient and her husband agreed to the current plan. She was seen during the multidisciplinary thoracic oncology clinic today by medical oncology, radiation oncology, thoracic navigator, social worker and physical therapist. The patient was advised to call immediately if she has any concerning symptoms in the interval. The patient voices understanding of current disease status and treatment options and is in agreement with the current care plan.  All questions were answered. The patient knows to call the clinic with any problems, questions or concerns. We can certainly see the patient much sooner if necessary.  Thank you so much for allowing me to participate in the care of [facility]. I will continue to follow up the patient with you and assist in her care.  I spent 55 minutes counseling the patient face to face. The total time spent in the appointment was 80 minutes.  Disclaimer: This note was dictated with voice recognition software. Similar sounding words can inadvertently be transcribed and may not be corrected upon review.   Neve Branscomb K. October 27, 2016, 3:26 PM

## 2016-10-27 NOTE — Progress Notes (Signed)
START ON PATHWAY REGIMEN - Small Cell Lung  LOS15: Etoposide Days 1, 2, 3 + Cisplatin Day 1 q21 Days x 4 Cycles with Concurrent Radiation**   A cycle is every 21 days:     Etoposide (Toposar(R)) 100 mg/m2 in a total of 500 mL NS IV over 2 hours days 1, 2, and 3 Dose Mod: None     Cisplatin (Platinol(R)) 75 mg/m2 in a total of 500 mL NS IV over 2 hours day 1 only. **Prehydrate and consider post-hydration** Dose Mod: None  **Always confirm dose/schedule in your pharmacy ordering system**    Patient Characteristics: Limited Stage, First Line Stage Grouping: Limited AJCC M Stage: X AJCC N Stage: X AJCC T Stage: X Line of therapy: First Line Would you be surprised if this patient died  in the next year? I would be surprised if this patient died in the next year  Intent of Therapy: Curative Intent, Discussed with Patient

## 2016-10-27 NOTE — Progress Notes (Signed)
[place] Clinical Social Work  Clinical Social Work met with patient/family and Futures trader at [facility] appointment to offer support and assess for psychosocial needs.  Medical oncologist reviewed patient's diagnosis and recommended treatment plan with patient/family. Mrs. Deveney was accompanied by her spouse, Jenny Reichmann.  Patient has two sons that live locally, Corene Cornea and Legrand [place].  Patient reports feeling anxiety and worry regarding her diagnosis.  CSW normalized patients feeling of anxiety regarding her new diagnosis and reviewed patient's treatment plan with patient and spouse.  CSW discussed supportive resources available to patient such as [employer], cancer center grant, support classes, and art programs.  ONCBCN DISTRESS SCREENING 10/27/2016  Screening Type Initial Screening  Distress experienced in past week (1-10) 10  Emotional problem type Nervousness/Anxiety;Adjusting to illness;Isolation/feeling alone;Boredom  Spiritual/Religous concerns type Facing my mortality;Loss of sense of purpose  Physical Problem type Nausea/vomiting;Sleep/insomnia;Breathing;Tingling hands/feet  Referral to clinical social work Yes     Clinical Social Work briefly discussed [place] Work role and [employer] support programs/services.  Clinical Social Work encouraged patient to call with any additional questions or concerns.   Polo Riley, MSW, LCSW, OSW-C Clinical Social Worker [facility] [phone]

## 2016-10-27 NOTE — Therapy (Signed)
[place], [place], [address] Phone: [phone]   Fax:  [phone]  Physical Therapy Evaluation  Patient Details  Name: Tammy Bowers MRN: [id number] Date of Birth: 1951-06-16 Referring Provider: Dr. Curt Bears  Encounter Date: 10/27/2016      PT End of Session - 10/27/16 1637    Visit Number 1   Number of Visits 1   PT Start Time 0923   PT Stop Time 1615   PT Time Calculation (min) 22 min   Activity Tolerance Patient tolerated treatment well   Behavior During Therapy [facility] for tasks assessed/performed      Past Medical History:  Diagnosis Date  . Adenopathy 10/10/2016   PERICARINAL  . Anemia   . Arthritis   . Asthma   . COPD (chronic obstructive pulmonary disease) ([place])   . Dyspnea   . Headache    migraines  . Hilar mass 10/10/2016   RIGHT  . History of kidney stones   . Insomnia   . Lung nodules 10/10/2016   RIGHT LOWER LOBE  . Mass of both adrenal glands ([place]) 10/10/2016  . Mood swing ([place])   . Pneumonia   . Substance abuse    TOBACCO  . UNSPECIFIED INFECTION OF BONE ANKLE AND FOOT 10/22/2009   Annotation: left ankle Qualifier: Diagnosis of  By: Gustavo Lah      Past Surgical History:  Procedure Laterality Date  . ANKLE SURGERY    . APPENDECTOMY    . LUNG BIOPSY N/A 10/19/2016   Procedure: LUNG BIOPSY;  Surgeon: Grace Isaac, MD;  Location: [place];  Service: Thoracic;  Laterality: N/A;  . TONSILLECTOMY    . VIDEO BRONCHOSCOPY WITH ENDOBRONCHIAL ULTRASOUND N/A 10/19/2016   Procedure: VIDEO BRONCHOSCOPY WITH ENDOBRONCHIAL ULTRASOUND;  Surgeon: Grace Isaac, MD;  Location: [place];  Service: Thoracic;  Laterality: N/A;    There were no vitals filed for this visit.       Subjective Assessment - 10/27/16 1616    Subjective Has been limited recently by shortness of breath and by stress.   Patient is accompained by: Family member  husband   Pertinent History  Pt. presented with shortness of breath and cough. Workup showed right hilar mass.  Has been diagnosed with limited stage small cell carcinoma and is expected to have chemo and radiation.  Ex-smoker who quit recently after 50 pack-years.  Ankle fracture about 8 years ago with ORIF.  Arthritis; asthma, COPD.   Patient Stated Goals get info from all lung clinic providers   Currently in Pain? Yes   Pain Score 9    Pain Location Back  also shoulders and head   Pain Descriptors / Indicators Aching   Aggravating Factors  stress   Pain Relieving Factors aspirirn            [facility] PT Assessment - 10/27/16 0001      Assessment   Medical Diagnosis limited stage small cell lung cancer   Referring Provider Dr. Curt Bears   Onset Date/Surgical Date 10/11/16     Precautions   Precautions Other (comment)   Precaution Comments cancer     Restrictions   Weight Bearing Restrictions No     Balance Screen   Has the patient fallen in the past 6 months No   Has the patient had a decrease in activity level because of a fear of falling?  No  but is cautious due to h/o ankle fx  Is the patient reluctant to leave their home because of a fear of falling?  No     Home Environment   Living Environment Private residence   Living Arrangements Spouse/significant other   Type of Home Other(Comment)  lives in a bus   [place] to enter   Entrance Stairs-Rails Right;Left     Prior Function   Level of [place] no regular exercise recently due to worsening health, but likes to walk and has a Orthoptist   Overall Cognitive Status Within Functional Limits for tasks assessed     Functional Tests   Functional tests Sit to Stand     Sit to Stand   Comments 8 times in 30 seconds, below average for age, and with moderate dyspnea     Posture/Postural Control   Posture/Postural Control Postural limitations   Postural Limitations Rounded Shoulders;Forward head      ROM / Strength   AROM / PROM / Strength AROM     AROM   Overall AROM Comments standing trunk AROM WFL all motions, but with some shoulder pain and with mod dyspnea after doing this and approx. 30 feet walking     Ambulation/Gait   Ambulation/Gait Yes   Ambulation/Gait Assistance 7: Independent   Gait Pattern --  lacks flow, but otherwise WFL; reports a limp at times     Balance   Balance Assessed Yes     Dynamic Standing Balance   Dynamic Standing - Comments reaches 11 inches forward in standing, below average for age                           PT Education - 10/27/16 1636    Education provided Yes   Education Details energy conservation, walking, Cure article on staying active, posture, breathing, PT info   Person(s) Educated Patient;Spouse   Methods Explanation;Handout   Comprehension Verbalized understanding               Lung Clinic Goals - 10/27/16 1645      Patient will be able to verbalize understanding of the benefit of exercise to decrease fatigue.   Status Achieved     Patient will be able to verbalize the importance of posture.   Status Achieved     Patient will be able to demonstrate diaphragmatic breathing for improved lung function.   Status Achieved     Patient will be able to verbalize understanding of the role of physical therapy to prevent functional decline and who to contact if physical therapy is needed.   Status Achieved             Plan - 10/27/16 1637    Clinical Impression Statement Patient diagnosed with limited stage small cell lung cancer and expected to have chemotherapy and radiation.  She has shortness of breath with light activity, coughs frequently, has limited 30 second sit to stand and forward reach in standing; also currently having pain, which she attributes to stress.   Rehab Potential Fair   PT Frequency One time visit   PT Treatment/Interventions Patient/family education   PT Next Visit Plan  None at this time; patient would benefit from pulmonary rehab or physical therapy if/when able after treatment is started   PT Home Exercise Plan walking, breathing exercises   Recommended Other Services possibly pulmonary rehab   Consulted and Agree with Plan of Care Patient  Patient will benefit from skilled therapeutic intervention in order to improve the following deficits and impairments:  Cardiopulmonary status limiting activity, Decreased activity tolerance, Decreased balance, Decreased endurance, Pain, Decreased strength  Visit Diagnosis: History of falling - Plan: PT plan of care cert/re-cert  Unsteadiness on feet - Plan: PT plan of care cert/re-cert  Midline low back pain without sciatica, unspecified chronicity - Plan: PT plan of care cert/re-cert     Problem List Patient Active Problem List   Diagnosis Date Noted  . Small cell lung carcinoma, right ([place]) 10/27/2016  . Substance abuse   . Hilar mass 10/10/2016  . Lung nodules 10/10/2016  . Adenopathy 10/10/2016  . Mass of both adrenal glands ([place]) 10/10/2016  . Asthma   . COPD (chronic obstructive pulmonary disease) ([place])   . Insomnia   . Mood swing ([place])   . UNSPECIFIED INFECTION OF BONE ANKLE AND FOOT 10/22/2009    Ruble Buttler 10/27/2016, 4:47 PM  [place], [place], [address] Phone: [phone]   Fax:  [phone]  Name: Tammy Bowers MRN: [id number] Date of Birth: [birth year]  Serafina Royals, PT 10/27/16 4:47 PM

## 2016-10-27 NOTE — Progress Notes (Signed)
Radiation Oncology         [phone] ________________________________   Thoracic Oncology Multidisciplinary Clinic Consultation  Name: Tammy Bowers MRN: [id number]  Date: 10/27/2016  DOB: Mar 19, 1951  TM:[email or web address],NLGXQ, MD  Grace Isaac, MD   REFERRING PHYSICIAN: Grace Isaac, MD  DIAGNOSIS: The encounter diagnosis was Small cell lung carcinoma, right ([place]).    ICD-9-CM ICD-10-CM   1. Small cell lung carcinoma, right (HCC) 162.9 C34.91     HISTORY OF PRESENT ILLNESS: Tammy Bowers is a 65 y.o. female seen at the request of Dr. Julien Nordmann for a new diagnosis of right lung cancer. The patient presented with SOB and cough, and work up of this revealed a right hilar mass. A CT chest 10/11/16 revealed right hilar 5.7 x 4.8 x 6.1 cm mass causing mass effect on the distal right pulmonary artery and right lower lobe bronchus and causing right middle lobe collapse, and right precarinal adenopathy. A Bronchoscopy on 11/1 revealed a small cell carcinoma.  A PET on 10/21/16 revealed a large hypermetabolic right upper lobe mass invading the right hilum and mediastinum and partially obstructing the right middle and lower lobe bronchi. There was also an indeterminate 6 mm right upper lobe pulmonary nodule. No findings for abdominal/pelvic metastatic disease or osseous metastatic disease. There was a benign-appearing right adrenal gland adenoma. Biopsy of the inter-medial bronchus on 10/19/2016 showed small cell carcinoma. Outside Brain MRI performed on 10/26/2016 was personally reviewed by Dr. Tammi Klippel on disc showing no gross evidence of edema or mass effect though, studies were  limited to lack of contrast. She comes today to review the findings and to discuss the options for radiotherapy.     PREVIOUS RADIATION THERAPY: No  PAST MEDICAL HISTORY:  Past Medical History:  Diagnosis Date  . Adenopathy 10/10/2016   PERICARINAL  . Anemia   . Arthritis   . Asthma   . COPD (chronic obstructive  pulmonary disease) ([place])   . Dyspnea   . Headache    migraines  . Hilar mass 10/10/2016   RIGHT  . History of kidney stones   . Insomnia   . Lung nodules 10/10/2016   RIGHT LOWER LOBE  . Mass of both adrenal glands ([place]) 10/10/2016  . Mood swing ([place])   . Pneumonia   . Substance abuse    TOBACCO  . UNSPECIFIED INFECTION OF BONE ANKLE AND FOOT 10/22/2009   Annotation: left ankle Qualifier: Diagnosis of  By: Gustavo Lah        PAST SURGICAL HISTORY: Past Surgical History:  Procedure Laterality Date  . ANKLE SURGERY    . APPENDECTOMY    . LUNG BIOPSY N/A 10/19/2016   Procedure: LUNG BIOPSY;  Surgeon: Grace Isaac, MD;  Location: [place];  Service: Thoracic;  Laterality: N/A;  . TONSILLECTOMY    . VIDEO BRONCHOSCOPY WITH ENDOBRONCHIAL ULTRASOUND N/A 10/19/2016   Procedure: VIDEO BRONCHOSCOPY WITH ENDOBRONCHIAL ULTRASOUND;  Surgeon: Grace Isaac, MD;  Location: [place];  Service: Thoracic;  Laterality: N/A;    FAMILY HISTORY:  Family History  Problem Relation Age of Onset  . Heart disease Father   . Colon cancer Father   . Heart disease Paternal Grandfather   . Ovarian cancer Mother     SOCIAL HISTORY:  Social History   Social History  . Marital status: Married    Spouse name: N/A  . Number of children: 2  . Years of education: N/A   Occupational History  . retired  Social History Main Topics  . Smoking status: Former Smoker    Packs/day: 1.00    Years: 50.00    Types: Cigarettes    Quit date: 08/02/2016  . Smokeless tobacco: Never Used  . Alcohol use No  . Drug use:     Types: Marijuana     Comment: none since Aug/Sept 2017  . Sexual activity: Yes    Partners: Male    Birth control/ protection: Post-menopausal   Other Topics Concern  . Not on file   Social History Narrative  . No narrative on file  The patient is married and resides in [place]. She is accompanied by her husband who previously was a patient of Dr. Johny Shears as well.    ALLERGIES: Lidocaine  MEDICATIONS:  Current Outpatient Prescriptions  Medication Sig Dispense Refill  . albuterol (PROVENTIL HFA;VENTOLIN HFA) 108 (90 BASE) MCG/ACT inhaler Inhale 2 puffs into the lungs every 4 (four) hours as needed for wheezing.    Marland Kitchen aspirin 325 MG EC tablet Take 325 mg by mouth 3 (three) times daily as needed for pain.    Marland Kitchen Fluticasone-Salmeterol (ADVAIR) 500-50 MCG/DOSE AEPB Inhale 1 puff into the lungs 2 (two) times daily.    . Misc Natural Products (GLUCOSAMINE CHOND COMPLEX/MSM PO) Take 1 tablet by mouth every other day.    . Multiple Vitamin (MULTIVITAMIN WITH MINERALS) TABS tablet Take 1 tablet by mouth every other day. Women's Multivitamin     . Omega-3 Fatty Acids (FISH OIL) 1000 MG CAPS Take 1,000 mg by mouth every other day.    . tiotropium (SPIRIVA) 18 MCG inhalation capsule Place 18 mcg into inhaler and inhale daily.     No current facility-administered medications for this encounter.     REVIEW OF SYSTEMS:  On review of systems, the patient reports that she is doing well overall. She denies any chest pain, fevers, chills, night sweats. She reports shortness of breath and cough. She reports 25 lbs weight loss in the last 3 months attributed to medication change, increased physical activity, diet change, and the cancer. She denies any bowel or bladder disturbances, and denies abdominal pain, nausea or vomiting. She denies any new musculoskeletal or joint aches or pains. She suffers from migraines. She takes multivitamins, fish oil, and MSM. A complete review of systems is obtained and is otherwise negative.   PHYSICAL EXAM:  Wt Readings from Last 3 Encounters:  10/27/16 213 lb 6.4 oz (96.8 kg)  10/19/16 121 lb (54.9 kg)  10/13/16 121 lb (54.9 kg)   Temp Readings from Last 3 Encounters:  10/27/16 98.4 F (36.9 C) (Oral)  10/19/16 98 F (36.7 C)  04/20/14 98.3 F (36.8 C) (Oral)   BP Readings from Last 3 Encounters:  10/27/16 135/70  10/19/16 124/66   10/13/16 (!) 129/92   Pulse Readings from Last 3 Encounters:  10/27/16 95  10/19/16 84  10/13/16 (!) 110    In general this is a well appearing Caucasian female in no acute distress. She is alert and oriented x4 and appropriate throughout the examination. HEENT reveals that the patient is normocephalic, atraumatic. EOMs are intact. PERRLA. Skin is intact without any evidence of gross lesions. Cardiovascular exam reveals a regular rate and rhythm, no clicks rubs or murmurs are auscultated. Chest is clear to auscultation bilaterally. Lymphatic assessment is performed and does not reveal any adenopathy in the cervical, supraclavicular, axillary, or inguinal chains. Abdomen has active bowel sounds in all quadrants and is intact. The abdomen is soft,  non tender, non distended. Lower extremities are negative for pretibial pitting edema, deep calf tenderness, cyanosis or clubbing.   KPS = 90  100 - Normal; no complaints; no evidence of disease. 90   - Able to carry on normal activity; minor signs or symptoms of disease. 80   - Normal activity with effort; some signs or symptoms of disease. 62   - Cares for self; unable to carry on normal activity or to do active work. 60   - Requires occasional assistance, but is able to care for most of his personal needs. 50   - Requires considerable assistance and frequent medical care. 40   - Disabled; requires special care and assistance. 5   - Severely disabled; hospital admission is indicated although death not imminent. 66   - Very sick; hospital admission necessary; active supportive treatment necessary. 10   - Moribund; fatal processes progressing rapidly. 0     - Dead  Karnofsky DA, Abelmann [place], Craver LS and [place] [facility] [phone]) The use of the nitrogen mustards in the palliative treatment of carcinoma: with particular reference to bronchogenic carcinoma Cancer 1 634-56  LABORATORY DATA:  Lab Results  Component Value Date   WBC 11.6 (H) 10/27/2016    HGB 13.1 10/27/2016   HCT 39.7 10/27/2016   MCV 85.6 10/27/2016   PLT 319 10/27/2016   Lab Results  Component Value Date   NA 142 10/27/2016   K 4.5 10/27/2016   CL 110 10/19/2016   CO2 25 10/27/2016   Lab Results  Component Value Date   ALT 12 10/27/2016   AST 18 10/27/2016   ALKPHOS 111 10/27/2016   BILITOT 0.37 10/27/2016     RADIOGRAPHY: Dg Chest 2 View  Result Date: 10/19/2016 CLINICAL DATA:  Bronchoscopy.  Malignancy. EXAM: CHEST  2 VIEW COMPARISON:  CT 10/11/2016. FINDINGS: Right hilar mass again noted. Right base atelectasis again noted. No pleural effusion or pneumothorax. Heart size normal. Thoracic spine degenerative change. IMPRESSION: Persistent right hilar mass and right base atelectasis. Electronically Signed   By: Marcello Moores  Register   On: 10/19/2016 09:09   Dg Chest 2 View  Result Date: 10/10/2016 CLINICAL DATA:  Cough, congestion, night sweats. EXAM: CHEST  2 VIEW COMPARISON:  02/15/2016. FINDINGS: Trachea is midline. Heart size normal. There is a right perihilar/ right lower lobe mass with associated airway narrowing. Mild volume loss at the base of the right hemi thorax. Lungs are hyperinflated but otherwise clear. No pleural fluid. IMPRESSION: Right perihilar mass is highly worrisome for primary bronchogenic carcinoma. CT chest with contrast is recommended in further evaluation. These results will be called to the ordering clinician or representative by the Radiologist Assistant, and communication documented in the PACS or zVision Dashboard. Electronically Signed   By: Lorin Picket M.D.   On: 10/10/2016 10:59   Ct Chest W Contrast  Result Date: 10/11/2016 CLINICAL DATA:  Right hilar lung mass EXAM: CT CHEST WITH CONTRAST TECHNIQUE: Multidetector CT imaging of the chest was performed during intravenous contrast administration. CONTRAST:  70m ISOVUE-300 IOPAMIDOL (ISOVUE-300) INJECTION 61% COMPARISON:  Chest radiograph of 10/10/2016 FINDINGS: Cardiovascular:  Atherosclerosis of the thoracic aorta at its arch. Takeoff of the great vessels is unremarkable. Normal sized cardiac chambers. Trace anterior pericardial effusion measuring up to 9 mm on series 2, image 99. Coronary arteriosclerosis noted. Mediastinum/Nodes: Precarinal lymphadenopathy measuring 2.5 cm short axis, series 2, image 53. 11 mm short axis subcarinal lymph node, series 5 image 66. 6 mm AP window short  axis lymph node series 5, image 47. No pathologically enlarged axillary or supraclavicular lymphadenopathy. Hyperdense appearing foci in the right hilum and subcarinal region likely represent contrast within tiny vessels. Small calcified lymph nodes are not entirely excluded. Lungs/Pleura: There is an approximately 5.7 x 4.8 x 6.1 cm right hilar mass with mass effect on the distal right pulmonary artery causing narrowing but without complete occlusion. Smaller tiny satellite nodules are seen in the right lower lobe, series 5, image 84 measuring up to 5 mm. More irregular masslike opacities adjacent to the right hilar mass, series 5, image 77 and series 3, image 104 likely represent additional metastatic lesions and/or postobstructive consolidation. This measures approximately 3.1 x 2.7 x 2.7 cm in the superior segment right lower lobe. There is marked narrowing of the right lower lobe bronchus with postobstructive airspace disease to the right lower lobe. There is right middle lobe collapse with volume loss and slight mediastinal shift to the right as a result. Within the right mainstem bronchus is a polypoid soft tissue density suspicious for endobronchial invasion, series 5, image 63 measuring approximately 7 mm. No pleural effusion. Minimal right lower lobe dependent atelectasis. No pleural effusion. Upper Abdomen: 2.8 x 2.6 x 3 cm right adrenal mass with Hounsfield unit of 35 post IV contrast. Left adrenal gland is slightly nodular at the apex as well measuring up to 9 mm in diameter. Musculoskeletal: No  lytic or blastic appearing abnormality. IMPRESSION: Right hilar 5.7 x 4.8 x 6.1 cm mass causing mass effect on the distal right pulmonary artery and right lower lobe bronchus and causing right middle lobe collapse. There are smaller nodular as well as ill-defined satellite lesions in the superior segment of the right lower lobe, right precarinal lymphadenopathy and bilateral adrenal masses consistent with metastasis. Electronically Signed   By: Ashley Royalty M.D.   On: 10/11/2016 15:36   Nm Pet Image Initial (pi) Skull Base To Thigh  Result Date: 10/21/2016 CLINICAL DATA:  Initial treatment strategy for lung mass. EXAM: NUCLEAR MEDICINE PET SKULL BASE TO THIGH TECHNIQUE: 10.54 mCi F-18 FDG was injected intravenously. Full-ring PET imaging was performed from the skull base to thigh after the radiotracer. CT data was obtained and used for attenuation correction and anatomic localization. FASTING BLOOD GLUCOSE:  Value: 87 mg/dl COMPARISON:  Chest CT 10/11/2016 FINDINGS: NECK No hypermetabolic lymph nodes in the neck. CHEST Large right infrahilar mass is markedly hypermetabolic with SUV max of 62.9. This is consistent with neoplasm invading the right hilum. More distally in the right lower lobe is a confluent area of airspace disease which could be postobstructive pneumonia or tumor. SUV max in this area is 14.8. Bulky precarinal lymphadenopathy is also markedly hypermetabolic with SUV max of 52.8. 6 mm pulmonary nodule in the right upper lobe on image number 16 is weakly hypermetabolic with SUV max of 3.0. Moderate right middle lobe atelectasis with calcified granulomas and associated calcified mediastinal nodes. ABDOMEN/PELVIS No abnormal hypermetabolic activity within the liver, pancreas, adrenal glands, or spleen. No hypermetabolic lymph nodes in the abdomen or pelvis. The right adrenal gland lesion is not hypermetabolic and measures 8 Hounsfield units consistent with a benign adenoma. SKELETON No focal  hypermetabolic activity to suggest skeletal metastasis. IMPRESSION: 1. Large hypermetabolic right upper lobe lung mass invading the right hilum and mediastinum and partially obstructing the right middle and lower lobe bronchi. Distal obstructive pneumonia versus interstitial spread of tumor in the right lower lobe. 2. Indeterminate 6 mm right upper lobe pulmonary  nodule. This needs observation. 3. No findings for abdominal/pelvic metastatic disease or osseous metastatic disease. 4. Benign-appearing right adrenal gland adenoma. Electronically Signed   By: Marijo Sanes M.D.   On: 10/21/2016 17:02      IMPRESSION/PLAN: 1. 65 y.o. female with limited stage small cell carcinoma of the right upper lobe invading the right hilum and mediastinum. Today, Dr. Tammi Klippel spoke with the patient and family about the findings and work-up thus far.  We discussed the natural history of limited stage small cell lung cancer and general treatment, highlighting the role of radiotherapy in the management of her disease both with upfront treatment as well as prophylactic cranial irradiation (PCI).  We discussed the available radiation techniques, and focused on the details of logistics and delivery.  We reviewed the anticipated acute and late sequelae associated with radiation in this setting.The patient would like to proceed with radiation and will be scheduled for CT simulation. She will begin chemotherapy next week in medical oncology with Dr. Julien Nordmann, and we will coordinate her start time based on Dr. Worthy Flank recommendations. 2. PCI. We discussed the role of prophylactic cranial irradiation in the management of small cell lung cancer to decrease the risk of disease spread to the brain. We discussed the timing of this would follow completion of radiotherapy and chemotherapy. We did outline the probable need for repeat MRI at that time with contrast to rule out disease at that time.  The above documentation reflects my direct  findings during this shared patient visit. Please see the separate note by Dr. Tammi Klippel on this date for the remainder of the patient's plan of care.   Carola Rhine, PAC   This document serves as a record of services personally performed by [name], MD. It was created on his behalf by Arlyce Harman, a trained medical scribe. The creation of this record is based on the scribe's personal observations and the provider's statements to them. This document has been checked and approved by the attending provider.

## 2016-10-28 ENCOUNTER — Encounter: Payer: Self-pay | Admitting: *Deleted

## 2016-10-28 ENCOUNTER — Telehealth: Payer: Self-pay

## 2016-10-28 MED ORDER — LORAZEPAM 0.5 MG PO TABS: 0.5000 mg | ORAL_TABLET | Freq: Every day | ORAL | 0 refills | Status: DC

## 2016-10-28 NOTE — Telephone Encounter (Signed)
Husband called stating Dr Julien Nordmann said he would rx some ativan for sleep. No note found, pt to start chemo next week. S/w Dr Alvy Bimler and received rx. Rx called to pharmacy. Called back and LVM ativan was called into pharmacy.

## 2016-10-28 NOTE — Progress Notes (Signed)
Oncology Nurse Navigator Documentation  Oncology Nurse Navigator Flowsheets 10/28/2016  Navigator Location [facility]  Navigator Encounter Type Clinic/MDC/I spoke with Tammy Bowers yesterday.  Gave and explained information on DX, resources and support at [facility], and next steps.  I tried to obtain MRI Brain scan from portal provided by the patient.  I was unable.  She stated she would get information on the MRI scan.  I gave her my phone number to call.   Abnormal Finding Date 10/11/2016  Confirmed Diagnosis Date 10/19/2016  Multidisiplinary Clinic Date 10/27/2016  Patient Visit Type MedOnc  Treatment Phase Pre-Tx/Tx Discussion  Barriers/Navigation Needs Coordination of Care;Education  Education Newly Diagnosed Cancer Education;Other  Interventions Coordination of Care;Education  Coordination of Care Other  Education Method Verbal;Written  Acuity Level 2  Acuity Level 2 Educational needs;Other  Time Spent with Patient 45

## 2016-11-01 ENCOUNTER — Encounter: Payer: Self-pay | Admitting: Internal Medicine

## 2016-11-01 ENCOUNTER — Encounter: Payer: Self-pay | Admitting: *Deleted

## 2016-11-01 ENCOUNTER — Telehealth: Payer: Self-pay | Admitting: *Deleted

## 2016-11-01 NOTE — Progress Notes (Signed)
Called patient from 3 day look-back list to introduce myself as her financial advocate, status of insurance deductible and OOP, and to see if she has any financial questions or concerns. Patient states she doesn't know about insurance her husband handles that. She thinks she has met her deductible and close to [facility]. Advised patient she may not need co-pay assistance at this time but may apply for the new year because she states her insurance renews in [place] she thinks. Also mentioned to patient about assistance for Neulasta through [employer] that she may apply for if needed. Patient wasn't sure about what Neulasta was. She states she was waiting for a call on when her chemo class is. Advised patient to let me know when she has been scheduled and I would set up time to meet with her after class. Patient was given my name and number to contact me once she finds out about her class.

## 2016-11-01 NOTE — Progress Notes (Signed)
Oncology Nurse Navigator Documentation  Oncology Nurse Navigator Flowsheets 11/01/2016  Navigator Location [facility]  Navigator Encounter Type Other/I followed up with Rad Onc to see about their treatment plan.  They would like to follow up with patient after 2 cycles of chemo.  I contacted scheduling to schedule Dr. Worthy Flank chemo orders and chemo class he wrote on 10/27/16.   Treatment Phase Pre-Tx/Tx Discussion  Barriers/Navigation Needs Coordination of Care  Interventions Coordination of Care  Coordination of Care Appts  Acuity Level 2  Acuity Level 2 Assistance expediting appointments  Time Spent with Patient 68

## 2016-11-01 NOTE — Telephone Encounter (Signed)
Oncology Nurse Navigator Documentation  Oncology Nurse Navigator Flowsheets 11/01/2016  Navigator Location [facility]  Navigator Encounter Type Telephone/I called Tammy Bowers to follow up on appts for chemo and radiation.  She has not been scheduled yet.  I called Dr. Johny Shears nurse to get an update.    Telephone Outgoing Call  Treatment Phase Pre-Tx/Tx Discussion  Barriers/Navigation Needs Coordination of Care  Interventions Coordination of Care  Coordination of Care Appts  Acuity Level 2  Acuity Level 2 Assistance expediting appointments  Time Spent with Patient 30

## 2016-11-02 ENCOUNTER — Telehealth: Payer: Self-pay | Admitting: *Deleted

## 2016-11-02 NOTE — Telephone Encounter (Signed)
Per  LOS I have scheduled appats andnotified the scheduler

## 2016-11-03 ENCOUNTER — Other Ambulatory Visit: Payer: BLUE CROSS/BLUE SHIELD

## 2016-11-03 ENCOUNTER — Other Ambulatory Visit: Payer: Self-pay | Admitting: *Deleted

## 2016-11-03 ENCOUNTER — Encounter: Payer: Self-pay | Admitting: *Deleted

## 2016-11-03 MED ORDER — PROCHLORPERAZINE MALEATE 10 MG PO TABS: 10.0000 mg | ORAL_TABLET | Freq: Four times a day (QID) | ORAL | 0 refills | Status: DC | PRN

## 2016-11-07 ENCOUNTER — Ambulatory Visit (HOSPITAL_BASED_OUTPATIENT_CLINIC_OR_DEPARTMENT_OTHER): Payer: BLUE CROSS/BLUE SHIELD

## 2016-11-07 VITALS — BP 114/70 | HR 104 | Temp 98.5°F | Resp 20

## 2016-11-07 DIAGNOSIS — C3491 Malignant neoplasm of unspecified part of right bronchus or lung: Secondary | ICD-10-CM | POA: Diagnosis not present

## 2016-11-07 DIAGNOSIS — Z5111 Encounter for antineoplastic chemotherapy: Secondary | ICD-10-CM

## 2016-11-07 MED ORDER — CISPLATIN CHEMO INJECTION 100MG/100ML
60.0000 mg/m2 | Freq: Once | INTRAVENOUS | Status: AC
  Administered 2016-11-07: 127 mg via INTRAVENOUS
  Filled 2016-11-07: qty 127

## 2016-11-07 MED ORDER — PALONOSETRON HCL INJECTION 0.25 MG/5ML
0.2500 mg | Freq: Once | INTRAVENOUS | Status: AC
  Administered 2016-11-07: 0.25 mg via INTRAVENOUS

## 2016-11-07 MED ORDER — SODIUM CHLORIDE 0.9 % IV SOLN
Freq: Once | INTRAVENOUS | Status: AC
  Administered 2016-11-07: 10:00:00 via INTRAVENOUS

## 2016-11-07 MED ORDER — OXYCODONE-ACETAMINOPHEN 5-325 MG PO TABS
1.0000 | ORAL_TABLET | Freq: Once | ORAL | Status: AC
  Administered 2016-11-07: 1 via ORAL

## 2016-11-07 MED ORDER — SODIUM CHLORIDE 0.9 % IV SOLN
120.0000 mg/m2 | Freq: Once | INTRAVENOUS | Status: AC
  Administered 2016-11-07: 250 mg via INTRAVENOUS
  Filled 2016-11-07: qty 12.5

## 2016-11-07 MED ORDER — OXYCODONE-ACETAMINOPHEN 5-325 MG PO TABS
ORAL_TABLET | ORAL | Status: AC
  Filled 2016-11-07: qty 1

## 2016-11-07 MED ORDER — PALONOSETRON HCL INJECTION 0.25 MG/5ML
INTRAVENOUS | Status: AC
  Filled 2016-11-07: qty 5

## 2016-11-07 MED ORDER — POTASSIUM CHLORIDE 2 MEQ/ML IV SOLN
Freq: Once | INTRAVENOUS | Status: AC
  Administered 2016-11-07: 10:00:00 via INTRAVENOUS
  Filled 2016-11-07: qty 10

## 2016-11-07 MED ORDER — FOSAPREPITANT DIMEGLUMINE INJECTION 150 MG
Freq: Once | INTRAVENOUS | Status: AC
  Administered 2016-11-07: 12:00:00 via INTRAVENOUS
  Filled 2016-11-07: qty 5

## 2016-11-07 NOTE — Patient Instructions (Signed)
[facility] Discharge Instructions for Patients Receiving Chemotherapy  Today you received the following chemotherapy agents: Cisplatin and Etoposide.  To help prevent nausea and vomiting after your treatment, we encourage you to take your nausea medication: Compazine. Take one every 6 hours as needed.  If you develop nausea and vomiting that is not controlled by your nausea medication, call the clinic.   BELOW ARE SYMPTOMS THAT SHOULD BE REPORTED IMMEDIATELY:  *FEVER GREATER THAN 100.5 F  *CHILLS WITH OR WITHOUT FEVER  NAUSEA AND VOMITING THAT IS NOT CONTROLLED WITH YOUR NAUSEA MEDICATION  *UNUSUAL SHORTNESS OF BREATH  *UNUSUAL BRUISING OR BLEEDING  TENDERNESS IN MOUTH AND THROAT WITH OR WITHOUT PRESENCE OF ULCERS  *URINARY PROBLEMS  *BOWEL PROBLEMS  UNUSUAL RASH Items with * indicate a potential emergency and should be followed up as soon as possible.  Feel free to call the clinic should you have any questions or concerns. The clinic phone number is [phone].  Please show the [place] at check-in to the Emergency Department and triage nurse.

## 2016-11-08 ENCOUNTER — Ambulatory Visit (HOSPITAL_BASED_OUTPATIENT_CLINIC_OR_DEPARTMENT_OTHER): Payer: BLUE CROSS/BLUE SHIELD

## 2016-11-08 ENCOUNTER — Ambulatory Visit: Payer: BLUE CROSS/BLUE SHIELD

## 2016-11-08 VITALS — BP 143/89 | HR 98 | Temp 98.6°F | Resp 20

## 2016-11-08 DIAGNOSIS — C3491 Malignant neoplasm of unspecified part of right bronchus or lung: Secondary | ICD-10-CM | POA: Diagnosis not present

## 2016-11-08 DIAGNOSIS — Z5111 Encounter for antineoplastic chemotherapy: Secondary | ICD-10-CM | POA: Diagnosis not present

## 2016-11-08 MED ORDER — SODIUM CHLORIDE 0.9 % IV SOLN
Freq: Once | INTRAVENOUS | Status: AC
  Administered 2016-11-08: 08:00:00 via INTRAVENOUS

## 2016-11-08 MED ORDER — SODIUM CHLORIDE 0.9 % IV SOLN
120.0000 mg/m2 | Freq: Once | INTRAVENOUS | Status: AC
  Administered 2016-11-08: 250 mg via INTRAVENOUS
  Filled 2016-11-08: qty 12.5

## 2016-11-08 MED ORDER — DEXAMETHASONE SODIUM PHOSPHATE 10 MG/ML IJ SOLN
INTRAMUSCULAR | Status: AC
  Filled 2016-11-08: qty 1

## 2016-11-08 MED ORDER — DEXAMETHASONE SODIUM PHOSPHATE 10 MG/ML IJ SOLN
10.0000 mg | Freq: Once | INTRAMUSCULAR | Status: AC
  Administered 2016-11-08: 10 mg via INTRAVENOUS

## 2016-11-08 MED ORDER — SODIUM CHLORIDE 0.9 % IV SOLN: 10.0000 mg | Freq: Once | INTRAVENOUS | Status: DC

## 2016-11-08 NOTE — Patient Instructions (Addendum)
[facility] Discharge Instructions for Patients Receiving Chemotherapy  Today you received the following chemotherapy agent: Etoposide  To help prevent nausea and vomiting after your treatment, we encourage you to take your nausea medication as prescribed.   If you develop nausea and vomiting that is not controlled by your nausea medication, call the clinic.   BELOW ARE SYMPTOMS THAT SHOULD BE REPORTED IMMEDIATELY:  *FEVER GREATER THAN 100.5 F  *CHILLS WITH OR WITHOUT FEVER  NAUSEA AND VOMITING THAT IS NOT CONTROLLED WITH YOUR NAUSEA MEDICATION  *UNUSUAL SHORTNESS OF BREATH  *UNUSUAL BRUISING OR BLEEDING  TENDERNESS IN MOUTH AND THROAT WITH OR WITHOUT PRESENCE OF ULCERS  *URINARY PROBLEMS  *BOWEL PROBLEMS  UNUSUAL RASH Items with * indicate a potential emergency and should be followed up as soon as possible.  Feel free to call the clinic you have any questions or concerns. The clinic phone number is [phone].  Please show the [place] at check-in to the Emergency Department and triage nurse.    Etoposide, VP-16 injection What is this medicine? ETOPOSIDE, VP-16 (e toe POE side) is a chemotherapy drug. It is used to treat testicular cancer, lung cancer, and other cancers. This medicine may be used for other purposes; ask your health care provider or pharmacist if you have questions. COMMON BRAND NAME(S): Etopophos, Toposar, VePesid What should I tell my health care provider before I take this medicine? They need to know if you have any of these conditions: -infection -kidney disease -liver disease -low blood counts, like low white cell, platelet, or red cell counts -an unusual or allergic reaction to etoposide, other medicines, foods, dyes, or preservatives -pregnant or trying to get pregnant -breast-feeding How should I use this medicine? This medicine is for infusion into a vein. It is administered in a hospital or clinic by a specially  trained health care professional. Talk to your pediatrician regarding the use of this medicine in children. Special care may be needed. Overdosage: If you think you have taken too much of this medicine contact a poison control center or emergency room at once. NOTE: This medicine is only for you. Do not share this medicine with others. What if I miss a dose? It is important not to miss your dose. Call your doctor or health care professional if you are unable to keep an appointment. What may interact with this medicine? -aspirin -certain medications for seizures like carbamazepine, phenobarbital, phenytoin, valproic acid -cyclosporine -levamisole -warfarin This list may not describe all possible interactions. Give your health care provider a list of all the medicines, herbs, non-prescription drugs, or dietary supplements you use. Also tell them if you smoke, drink alcohol, or use illegal drugs. Some items may interact with your medicine. What should I watch for while using this medicine? Visit your doctor for checks on your progress. This drug may make you feel generally unwell. This is not uncommon, as chemotherapy can affect healthy cells as well as cancer cells. Report any side effects. Continue your course of treatment even though you feel ill unless your doctor tells you to stop. In some cases, you may be given additional medicines to help with side effects. Follow all directions for their use. Call your doctor or health care professional for advice if you get a fever, chills or sore throat, or other symptoms of a cold or flu. Do not treat yourself. This drug decreases your body's ability to fight infections. Try to avoid being around people who are sick.  This medicine may increase your risk to bruise or bleed. Call your doctor or health care professional if you notice any unusual bleeding. Talk to your doctor about your risk of cancer. You may be more at risk for certain types of cancers if  you take this medicine. Do not become pregnant while taking this medicine or for at least 6 months after stopping it. Women should inform their doctor if they wish to become pregnant or think they might be pregnant. Women of child-bearing potential will need to have a negative pregnancy test before starting this medicine. There is a potential for serious side effects to an unborn child. Talk to your health care professional or pharmacist for more information. Do not breast-feed an infant while taking this medicine. Men must use a latex condom during sexual contact with a woman while taking this medicine and for at least 4 months after stopping it. A latex condom is needed even if you have had a vasectomy. Contact your doctor right away if your partner becomes pregnant. Do not donate sperm while taking this medicine and for at least 4 months after you stop taking this medicine. Men should inform their doctors if they wish to father a child. This medicine may lower sperm counts. What side effects may I notice from receiving this medicine? Side effects that you should report to your doctor or health care professional as soon as possible: -allergic reactions like skin rash, itching or hives, swelling of the face, lips, or tongue -low blood counts - this medicine may decrease the number of white blood cells, red blood cells and platelets. You may be at increased risk for infections and bleeding. -signs of infection - fever or chills, cough, sore throat, pain or difficulty passing urine -signs of decreased platelets or bleeding - bruising, pinpoint red spots on the skin, black, tarry stools, blood in the urine -signs of decreased red blood cells - unusually weak or tired, fainting spells, lightheadedness -breathing problems -changes in vision -mouth or throat sores or ulcers -pain, redness, swelling or irritation at the injection site -pain, tingling, numbness in the hands or feet -redness, blistering,  peeling or loosening of the skin, including inside the mouth -seizures -vomiting Side effects that usually do not require medical attention (report to your doctor or health care professional if they continue or are bothersome): -diarrhea -hair loss -loss of appetite -nausea -stomach pain This list may not describe all possible side effects. Call your doctor for medical advice about side effects. You may report side effects to FDA at 1-800-FDA-1088. Where should I keep my medicine? This drug is given in a hospital or clinic and will not be stored at home. NOTE: This sheet is a summary. It may not cover all possible information. If you have questions about this medicine, talk to your doctor, pharmacist, or health care provider.  2017 Elsevier/Gold Standard (2015-11-27 11:53:23)

## 2016-11-09 ENCOUNTER — Ambulatory Visit: Payer: BLUE CROSS/BLUE SHIELD

## 2016-11-09 ENCOUNTER — Ambulatory Visit (HOSPITAL_BASED_OUTPATIENT_CLINIC_OR_DEPARTMENT_OTHER): Payer: BLUE CROSS/BLUE SHIELD

## 2016-11-09 VITALS — BP 137/79 | HR 78 | Temp 98.6°F

## 2016-11-09 DIAGNOSIS — Z5111 Encounter for antineoplastic chemotherapy: Secondary | ICD-10-CM | POA: Diagnosis not present

## 2016-11-09 DIAGNOSIS — C3491 Malignant neoplasm of unspecified part of right bronchus or lung: Secondary | ICD-10-CM

## 2016-11-09 MED ORDER — DEXAMETHASONE SODIUM PHOSPHATE 10 MG/ML IJ SOLN
INTRAMUSCULAR | Status: AC
  Filled 2016-11-09: qty 1

## 2016-11-09 MED ORDER — DEXAMETHASONE SODIUM PHOSPHATE 10 MG/ML IJ SOLN
10.0000 mg | Freq: Once | INTRAMUSCULAR | Status: AC
  Administered 2016-11-09: 10 mg via INTRAVENOUS

## 2016-11-09 MED ORDER — DEXAMETHASONE SODIUM PHOSPHATE 100 MG/10ML IJ SOLN: 10.0000 mg | Freq: Once | INTRAMUSCULAR | Status: DC

## 2016-11-09 MED ORDER — SODIUM CHLORIDE 0.9 % IV SOLN
Freq: Once | INTRAVENOUS | Status: AC
  Administered 2016-11-09: 09:00:00 via INTRAVENOUS

## 2016-11-09 MED ORDER — SODIUM CHLORIDE 0.9 % IV SOLN
120.0000 mg/m2 | Freq: Once | INTRAVENOUS | Status: AC
  Administered 2016-11-09: 250 mg via INTRAVENOUS
  Filled 2016-11-09: qty 12.5

## 2016-11-09 NOTE — Patient Instructions (Signed)
[facility] Discharge Instructions for Patients Receiving Chemotherapy  Today you received the following chemotherapy agents:  Etoposide  To help prevent nausea and vomiting after your treatment, we encourage you to take your nausea medication.   If you develop nausea and vomiting that is not controlled by your nausea medication, call the clinic.   BELOW ARE SYMPTOMS THAT SHOULD BE REPORTED IMMEDIATELY:  *FEVER GREATER THAN 100.5 F  *CHILLS WITH OR WITHOUT FEVER  NAUSEA AND VOMITING THAT IS NOT CONTROLLED WITH YOUR NAUSEA MEDICATION  *UNUSUAL SHORTNESS OF BREATH  *UNUSUAL BRUISING OR BLEEDING  TENDERNESS IN MOUTH AND THROAT WITH OR WITHOUT PRESENCE OF ULCERS  *URINARY PROBLEMS  *BOWEL PROBLEMS  UNUSUAL RASH Items with * indicate a potential emergency and should be followed up as soon as possible.  Feel free to call the clinic you have any questions or concerns. The clinic phone number is [phone].  Please show the CHEMO ALERT CARD at check-in to the Emergency Department and triage nurse.   

## 2016-11-15 ENCOUNTER — Telehealth: Payer: Self-pay | Admitting: *Deleted

## 2016-11-15 NOTE — Telephone Encounter (Signed)
CALLED PATIENT TO INFORM OF NURSE EVAL ON 12-02-16 @ 8:30 AM AND HER SIM ON 12-02-16 @ 9 AM, LVM FOR A RETURN CALL

## 2016-11-16 ENCOUNTER — Encounter: Payer: Self-pay | Admitting: Internal Medicine

## 2016-11-16 ENCOUNTER — Ambulatory Visit (HOSPITAL_BASED_OUTPATIENT_CLINIC_OR_DEPARTMENT_OTHER): Payer: BLUE CROSS/BLUE SHIELD | Admitting: Internal Medicine

## 2016-11-16 ENCOUNTER — Other Ambulatory Visit (HOSPITAL_BASED_OUTPATIENT_CLINIC_OR_DEPARTMENT_OTHER): Payer: BLUE CROSS/BLUE SHIELD

## 2016-11-16 ENCOUNTER — Telehealth: Payer: Self-pay | Admitting: Internal Medicine

## 2016-11-16 ENCOUNTER — Telehealth: Payer: Self-pay | Admitting: *Deleted

## 2016-11-16 VITALS — BP 109/58 | HR 93 | Temp 98.6°F | Resp 18 | Wt 207.8 lb

## 2016-11-16 DIAGNOSIS — C3491 Malignant neoplasm of unspecified part of right bronchus or lung: Secondary | ICD-10-CM | POA: Diagnosis not present

## 2016-11-16 DIAGNOSIS — C3411 Malignant neoplasm of upper lobe, right bronchus or lung: Secondary | ICD-10-CM | POA: Diagnosis not present

## 2016-11-16 DIAGNOSIS — Z5111 Encounter for antineoplastic chemotherapy: Secondary | ICD-10-CM

## 2016-11-16 LAB — COMPREHENSIVE METABOLIC PANEL
ALT: 16 U/L (ref 0–55)
AST: 16 U/L (ref 5–34)
Albumin: 2.9 g/dL — ABNORMAL LOW (ref 3.5–5.0)
Alkaline Phosphatase: 112 U/L (ref 40–150)
Anion Gap: 9 mEq/L (ref 3–11)
BUN: 17.6 mg/dL (ref 7.0–26.0)
CO2: 25 mEq/L (ref 22–29)
Calcium: 9.4 mg/dL (ref 8.4–10.4)
Chloride: 104 mEq/L (ref 98–109)
Creatinine: 0.8 mg/dL (ref 0.6–1.1)
EGFR: 74 mL/min/{1.73_m2} — ABNORMAL LOW (ref >90)
Glucose: 82 mg/dl (ref 70–140)
Potassium: 4.2 mEq/L (ref 3.5–5.1)
Sodium: 139 mEq/L (ref 136–145)
Total Bilirubin: 0.22 mg/dL (ref 0.20–1.20)
Total Protein: 6.7 g/dL (ref 6.4–8.3)

## 2016-11-16 LAB — MAGNESIUM: Magnesium: 1.9 mg/dl (ref 1.5–2.5)

## 2016-11-16 LAB — CBC WITH DIFFERENTIAL/PLATELET
BASO%: 0.1 % (ref 0.0–2.0)
Basophils Absolute: 0 10*3/uL (ref 0.0–0.1)
EOS%: 0.7 % (ref 0.0–7.0)
Eosinophils Absolute: 0 10*3/uL (ref 0.0–0.5)
HCT: 36.9 % (ref 34.8–46.6)
HGB: 12.1 g/dL (ref 11.6–15.9)
LYMPH%: 63.6 % — ABNORMAL HIGH (ref 14.0–49.7)
MCH: 27.8 pg (ref 25.1–34.0)
MCHC: 32.8 g/dL (ref 31.5–36.0)
MCV: 84.8 fL (ref 79.5–101.0)
MONO#: 0 10*3/uL — ABNORMAL LOW (ref 0.1–0.9)
MONO%: 1.9 % (ref 0.0–14.0)
NEUT#: 0.8 10*3/uL — ABNORMAL LOW (ref 1.5–6.5)
NEUT%: 33.7 % — ABNORMAL LOW (ref 38.4–76.8)
Platelets: 283 10*3/uL (ref 145–400)
RBC: 4.36 10*6/uL (ref 3.70–5.45)
RDW: 13 % (ref 11.2–14.5)
WBC: 2.4 10*3/uL — ABNORMAL LOW (ref 3.9–10.3)
lymph#: 1.5 10*3/uL (ref 0.9–3.3)

## 2016-11-16 NOTE — Progress Notes (Signed)
[place] [phone]   [phone]  OFFICE PROGRESS NOTE  Lujean Amel, MD [address] [place] [address]  DIAGNOSIS: Limited stage (T2b, N2, M0) small cell lung cancer presented with large right hilar mass with mediastinal invasion as well as right lower lobe pulmonary nodule diagnosed in November 2017.  PRIOR THERAPY: None.  CURRENT THERAPY: Systemic chemotherapy with cisplatin 60 MG/M2 on day 1 and etoposide 120 MG/M2 on days 1, 2 and 3 every 3 weeks. This will be concurrent with radiotherapy.  INTERVAL HISTORY: Tammy Bowers 65 y.o. female returns to the clinic today for follow-up visit accompanied by her boyfriend. The patient is feeling fine today was no specific complaints. She tolerated the first week of her treatment with cisplatin and etoposide fairly well with no significant adverse effects. She denied having any significant nausea or vomiting. She has no fever or chills. She denied having any significant chest pain, shortness breath, cough or hemoptysis. She reported an unusual adverse effect from the treatment with increase in her sexual desire and orgasms started the third day after her treatment. She is here today for evaluation and repeat blood work.  MEDICAL HISTORY: Past Medical History:  Diagnosis Date  . Adenopathy 10/10/2016   PERICARINAL  . Anemia   . Arthritis   . Asthma   . COPD (chronic obstructive pulmonary disease) ([place])   . Dyspnea   . Headache    migraines  . Hilar mass 10/10/2016   RIGHT  . History of kidney stones   . Insomnia   . Lung nodules 10/10/2016   RIGHT LOWER LOBE  . Mass of both adrenal glands ([place]) 10/10/2016  . Mood swing ([place])   . Pneumonia   . Substance abuse    TOBACCO  . UNSPECIFIED INFECTION OF BONE ANKLE AND FOOT 10/22/2009   Annotation: left ankle Qualifier: Diagnosis of  By: Patsy Baltimore RN, Denise      ALLERGIES:  is allergic to lidocaine.  MEDICATIONS:    Current Outpatient Prescriptions  Medication Sig Dispense Refill  . albuterol (PROVENTIL HFA;VENTOLIN HFA) 108 (90 BASE) MCG/ACT inhaler Inhale 2 puffs into the lungs every 4 (four) hours as needed for wheezing.    Marland Kitchen amitriptyline (ELAVIL) 10 MG tablet Take 10 mg by mouth daily.  0  . aspirin 325 MG EC tablet Take 325 mg by mouth 3 (three) times daily as needed for pain.    Marland Kitchen Fluticasone-Salmeterol (ADVAIR) 500-50 MCG/DOSE AEPB Inhale 1 puff into the lungs 2 (two) times daily.    Marland Kitchen ibuprofen (ADVIL,MOTRIN) 800 MG tablet Take 800 mg by mouth 3 (three) times daily as needed.  0  . LORazepam (ATIVAN) 0.5 MG tablet Take 1 tablet (0.5 mg total) by mouth at bedtime. 20 tablet 0  . Misc Natural Products (GLUCOSAMINE CHOND COMPLEX/MSM PO) Take 1 tablet by mouth every other day.    . Multiple Vitamin (MULTIVITAMIN WITH MINERALS) TABS tablet Take 1 tablet by mouth every other day. Women's Multivitamin     . Omega-3 Fatty Acids (FISH OIL) 1000 MG CAPS Take 1,000 mg by mouth every other day.    . prochlorperazine (COMPAZINE) 10 MG tablet Take 1 tablet (10 mg total) by mouth every 6 (six) hours as needed for nausea or vomiting. 30 tablet 0  . tiotropium (SPIRIVA) 18 MCG inhalation capsule Place 18 mcg into inhaler and inhale daily.     No current facility-administered medications for this visit.  SURGICAL HISTORY:  Past Surgical History:  Procedure Laterality Date  . ANKLE SURGERY    . APPENDECTOMY    . LUNG BIOPSY N/A 10/19/2016   Procedure: LUNG BIOPSY;  Surgeon: Grace Isaac, MD;  Location: [facility];  Service: Thoracic;  Laterality: N/A;  . TONSILLECTOMY    . VIDEO BRONCHOSCOPY WITH ENDOBRONCHIAL ULTRASOUND N/A 10/19/2016   Procedure: VIDEO BRONCHOSCOPY WITH ENDOBRONCHIAL ULTRASOUND;  Surgeon: Grace Isaac, MD;  Location: [facility] OR;  Service: Thoracic;  Laterality: N/A;    REVIEW OF SYSTEMS:  A comprehensive review of systems was negative except for: Constitutional: positive for fatigue    PHYSICAL EXAMINATION: General appearance: alert, cooperative, fatigued and no distress Head: Normocephalic, without obvious abnormality, atraumatic Neck: no adenopathy, no JVD, supple, symmetrical, trachea midline and thyroid not enlarged, symmetric, no tenderness/mass/nodules Lymph nodes: Cervical, supraclavicular, and axillary nodes normal. Resp: clear to auscultation bilaterally Back: symmetric, no curvature. ROM normal. No CVA tenderness. Cardio: regular rate and rhythm, S1, S2 normal, no murmur, click, rub or gallop GI: soft, non-tender; bowel sounds normal; no masses,  no organomegaly Extremities: extremities normal, atraumatic, no cyanosis or edema  ECOG PERFORMANCE STATUS: 1 - Symptomatic but completely ambulatory  Blood pressure (!) 109/58, pulse 93, temperature 98.6 F (37 C), temperature source Oral, resp. rate 18, weight 207 lb 12.8 oz (94.3 kg), SpO2 97 %.  LABORATORY DATA: Lab Results  Component Value Date   WBC 2.4 (L) 11/16/2016   HGB 12.1 11/16/2016   HCT 36.9 11/16/2016   MCV 84.8 11/16/2016   PLT 283 11/16/2016      Chemistry      Component Value Date/Time   NA 142 10/27/2016 1350   K 4.5 10/27/2016 1350   CL 110 10/19/2016 1001   CO2 25 10/27/2016 1350   BUN 15.2 10/27/2016 1350   CREATININE 0.8 10/27/2016 1350      Component Value Date/Time   CALCIUM 9.4 10/27/2016 1350   ALKPHOS 111 10/27/2016 1350   AST 18 10/27/2016 1350   ALT 12 10/27/2016 1350   BILITOT 0.37 10/27/2016 1350       RADIOGRAPHIC STUDIES: Dg Chest 2 View  Result Date: 10/19/2016 CLINICAL DATA:  Bronchoscopy.  Malignancy. EXAM: CHEST  2 VIEW COMPARISON:  CT 10/11/2016. FINDINGS: Right hilar mass again noted. Right base atelectasis again noted. No pleural effusion or pneumothorax. Heart size normal. Thoracic spine degenerative change. IMPRESSION: Persistent right hilar mass and right base atelectasis. Electronically Signed   By: Marcello Moores  Register   On: 10/19/2016 09:09   Nm Pet  Image Initial (pi) Skull Base To Thigh  Result Date: 10/21/2016 CLINICAL DATA:  Initial treatment strategy for lung mass. EXAM: NUCLEAR MEDICINE PET SKULL BASE TO THIGH TECHNIQUE: 10.54 mCi F-18 FDG was injected intravenously. Full-ring PET imaging was performed from the skull base to thigh after the radiotracer. CT data was obtained and used for attenuation correction and anatomic localization. FASTING BLOOD GLUCOSE:  Value: 87 mg/dl COMPARISON:  Chest CT 10/11/2016 FINDINGS: NECK No hypermetabolic lymph nodes in the neck. CHEST Large right infrahilar mass is markedly hypermetabolic with SUV max of 16.9. This is consistent with neoplasm invading the right hilum. More distally in the right lower lobe is a confluent area of airspace disease which could be postobstructive pneumonia or tumor. SUV max in this area is 14.8. Bulky precarinal lymphadenopathy is also markedly hypermetabolic with SUV max of 67.8. 6 mm pulmonary nodule in the right upper lobe on image number 16 is weakly hypermetabolic with  SUV max of 3.0. Moderate right middle lobe atelectasis with calcified granulomas and associated calcified mediastinal nodes. ABDOMEN/PELVIS No abnormal hypermetabolic activity within the liver, pancreas, adrenal glands, or spleen. No hypermetabolic lymph nodes in the abdomen or pelvis. The right adrenal gland lesion is not hypermetabolic and measures 8 Hounsfield units consistent with a benign adenoma. SKELETON No focal hypermetabolic activity to suggest skeletal metastasis. IMPRESSION: 1. Large hypermetabolic right upper lobe lung mass invading the right hilum and mediastinum and partially obstructing the right middle and lower lobe bronchi. Distal obstructive pneumonia versus interstitial spread of tumor in the right lower lobe. 2. Indeterminate 6 mm right upper lobe pulmonary nodule. This needs observation. 3. No findings for abdominal/pelvic metastatic disease or osseous metastatic disease. 4. Benign-appearing right  adrenal gland adenoma. Electronically Signed   By: Marijo Sanes M.D.   On: 10/21/2016 17:02    ASSESSMENT AND PLAN: This is a very pleasant 65 years old white female recently diagnosed with limited stage small cell lung cancer. She is currently undergoing systemic chemotherapy with cisplatin and etoposide status post 1 cycle started last week. She tolerated the first week of her treatment well with no significant adverse effects. She is expected to start the concurrent radiotherapy with cycle #2. I recommended for the patient to continue her current treatment as scheduled and she will come back for follow-up visit in 2 weeks for evaluation before starting cycle #2. She was advised to call immediately if she has any concerning symptoms in the interval. The patient voices understanding of current disease status and treatment options and is in agreement with the current care plan.  All questions were answered. The patient knows to call the clinic with any problems, questions or concerns. We can certainly see the patient much sooner if necessary.  Disclaimer: This note was dictated with voice recognition software. Similar sounding words can inadvertently be transcribed and may not be corrected upon review.

## 2016-11-16 NOTE — Telephone Encounter (Signed)
Message sent to chemo scheduler to be added. Appointments scheduled per 11/16/16 los. A copy of the AVS report and appointment schedule was given to patient, per 11/16/16 los.

## 2016-11-16 NOTE — Telephone Encounter (Signed)
Per LOS I have scheduled appts and notified the scheduler 

## 2016-11-19 ENCOUNTER — Encounter (HOSPITAL_COMMUNITY): Payer: Self-pay | Admitting: *Deleted

## 2016-11-19 ENCOUNTER — Emergency Department (HOSPITAL_COMMUNITY): Payer: BLUE CROSS/BLUE SHIELD

## 2016-11-19 ENCOUNTER — Inpatient Hospital Stay (HOSPITAL_COMMUNITY)
Admission: EM | Admit: 2016-11-19 | Discharge: 2016-11-22 | DRG: 808 | Disposition: A | Discharge status: Home / Self Care | Payer: BLUE CROSS/BLUE SHIELD | Attending: Family Medicine | Admitting: Family Medicine

## 2016-11-19 DIAGNOSIS — D709 Neutropenia, unspecified: Secondary | ICD-10-CM | POA: Diagnosis not present

## 2016-11-19 DIAGNOSIS — Z8249 Family history of ischemic heart disease and other diseases of the circulatory system: Secondary | ICD-10-CM

## 2016-11-19 DIAGNOSIS — J40 Bronchitis, not specified as acute or chronic: Secondary | ICD-10-CM | POA: Diagnosis present

## 2016-11-19 DIAGNOSIS — Z7951 Long term (current) use of inhaled steroids: Secondary | ICD-10-CM

## 2016-11-19 DIAGNOSIS — T451X5A Adverse effect of antineoplastic and immunosuppressive drugs, initial encounter: Secondary | ICD-10-CM | POA: Diagnosis present

## 2016-11-19 DIAGNOSIS — Z8041 Family history of malignant neoplasm of ovary: Secondary | ICD-10-CM

## 2016-11-19 DIAGNOSIS — C3491 Malignant neoplasm of unspecified part of right bronchus or lung: Secondary | ICD-10-CM | POA: Diagnosis present

## 2016-11-19 DIAGNOSIS — Z87891 Personal history of nicotine dependence: Secondary | ICD-10-CM

## 2016-11-19 DIAGNOSIS — R509 Fever, unspecified: Secondary | ICD-10-CM | POA: Diagnosis not present

## 2016-11-19 DIAGNOSIS — D61810 Antineoplastic chemotherapy induced pancytopenia: Secondary | ICD-10-CM | POA: Diagnosis not present

## 2016-11-19 DIAGNOSIS — Z87442 Personal history of urinary calculi: Secondary | ICD-10-CM

## 2016-11-19 DIAGNOSIS — C3490 Malignant neoplasm of unspecified part of unspecified bronchus or lung: Secondary | ICD-10-CM | POA: Diagnosis present

## 2016-11-19 DIAGNOSIS — J449 Chronic obstructive pulmonary disease, unspecified: Secondary | ICD-10-CM | POA: Diagnosis not present

## 2016-11-19 DIAGNOSIS — D703 Neutropenia due to infection: Principal | ICD-10-CM | POA: Diagnosis present

## 2016-11-19 DIAGNOSIS — R5081 Fever presenting with conditions classified elsewhere: Secondary | ICD-10-CM

## 2016-11-19 DIAGNOSIS — Z800 Family history of malignant neoplasm of digestive organs: Secondary | ICD-10-CM

## 2016-11-19 DIAGNOSIS — J189 Pneumonia, unspecified organism: Secondary | ICD-10-CM | POA: Diagnosis present

## 2016-11-19 DIAGNOSIS — J440 Chronic obstructive pulmonary disease with acute lower respiratory infection: Secondary | ICD-10-CM | POA: Diagnosis present

## 2016-11-19 DIAGNOSIS — Z9221 Personal history of antineoplastic chemotherapy: Secondary | ICD-10-CM

## 2016-11-19 HISTORY — DX: Malignant (primary) neoplasm, unspecified: C80.1

## 2016-11-19 LAB — COMPREHENSIVE METABOLIC PANEL
ALT: 18 U/L (ref 14–54)
AST: 19 U/L (ref 15–41)
Albumin: 3.1 g/dL — ABNORMAL LOW (ref 3.5–5.0)
Alkaline Phosphatase: 100 U/L (ref 38–126)
Anion gap: 11 (ref 5–15)
BUN: 18 mg/dL (ref 6–20)
CO2: 24 mmol/L (ref 22–32)
Calcium: 8.8 mg/dL — ABNORMAL LOW (ref 8.9–10.3)
Chloride: 101 mmol/L (ref 101–111)
Creatinine, Ser: 0.98 mg/dL (ref 0.44–1.00)
GFR calc Af Amer: >60 mL/min (ref >60)
GFR calc non Af Amer: >60 mL/min (ref >60)
Glucose, Bld: 110 mg/dL — ABNORMAL HIGH (ref 65–99)
Potassium: 3.5 mmol/L (ref 3.5–5.1)
Sodium: 136 mmol/L (ref 135–145)
Total Bilirubin: <0.1 mg/dL — ABNORMAL LOW (ref 0.3–1.2)
Total Protein: 6.3 g/dL — ABNORMAL LOW (ref 6.5–8.1)

## 2016-11-19 LAB — URINE MICROSCOPIC-ADD ON

## 2016-11-19 LAB — CBC WITH DIFFERENTIAL/PLATELET
Band Neutrophils: 0 %
Basophils Absolute: 0.1 10*3/uL (ref 0.0–0.1)
Basophils Relative: 4 %
Blasts: 0 %
Eosinophils Absolute: 0 10*3/uL (ref 0.0–0.7)
Eosinophils Relative: 1 %
HCT: 35.8 % — ABNORMAL LOW (ref 36.0–46.0)
Hemoglobin: 11.9 g/dL — ABNORMAL LOW (ref 12.0–15.0)
Lymphocytes Relative: 88 %
Lymphs Abs: 1.5 10*3/uL (ref 0.7–4.0)
MCH: 28 pg (ref 26.0–34.0)
MCHC: 33.2 g/dL (ref 30.0–36.0)
MCV: 84.2 fL (ref 78.0–100.0)
Metamyelocytes Relative: 0 %
Monocytes Absolute: 0.1 10*3/uL (ref 0.1–1.0)
Monocytes Relative: 3 %
Myelocytes: 0 %
Neutro Abs: 0.1 10*3/uL — ABNORMAL LOW (ref 1.7–7.7)
Neutrophils Relative %: 4 %
Other: 0 %
Platelets: 139 10*3/uL — ABNORMAL LOW (ref 150–400)
Promyelocytes Absolute: 0 %
RBC: 4.25 MIL/uL (ref 3.87–5.11)
RDW: 12.6 % (ref 11.5–15.5)
WBC: 1.8 10*3/uL — ABNORMAL LOW (ref 4.0–10.5)
nRBC: 0 /100 WBC

## 2016-11-19 LAB — URINALYSIS, ROUTINE W REFLEX MICROSCOPIC
Glucose, UA: NEGATIVE mg/dL
Hgb urine dipstick: NEGATIVE
Ketones, ur: NEGATIVE mg/dL
Nitrite: NEGATIVE
Protein, ur: NEGATIVE mg/dL
Specific Gravity, Urine: 1.023 (ref 1.005–1.030)
pH: 5 (ref 5.0–8.0)

## 2016-11-19 LAB — I-STAT BETA HCG BLOOD, ED (MC, WL, AP ONLY): I-stat hCG, quantitative: <5 m[IU]/mL (ref <5)

## 2016-11-19 LAB — PROTIME-INR
INR: 0.97
Prothrombin Time: 12.8 seconds (ref 11.4–15.2)

## 2016-11-19 LAB — I-STAT CG4 LACTIC ACID, ED: Lactic Acid, Venous: 1.59 mmol/L (ref 0.5–1.9)

## 2016-11-19 MED ORDER — SODIUM CHLORIDE 0.9% FLUSH
3.0000 mL | Freq: Two times a day (BID) | INTRAVENOUS | Status: DC
  Administered 2016-11-20 – 2016-11-21 (×4): 3 mL via INTRAVENOUS

## 2016-11-19 MED ORDER — SODIUM CHLORIDE 0.9 % IV SOLN: 250.0000 mL | INTRAVENOUS | Status: DC | PRN

## 2016-11-19 MED ORDER — LEVOFLOXACIN 750 MG PO TABS
750.0000 mg | ORAL_TABLET | Freq: Once | ORAL | Status: AC
  Administered 2016-11-19: 750 mg via ORAL
  Filled 2016-11-19: qty 1

## 2016-11-19 MED ORDER — ONDANSETRON HCL 4 MG PO TABS
4.0000 mg | ORAL_TABLET | Freq: Four times a day (QID) | ORAL | Status: DC | PRN
Start: 2016-11-19 — End: 2016-11-22

## 2016-11-19 MED ORDER — SODIUM CHLORIDE 0.9 % IV SOLN
INTRAVENOUS | Status: AC
  Administered 2016-11-20: 01:00:00 via INTRAVENOUS

## 2016-11-19 MED ORDER — PROCHLORPERAZINE MALEATE 10 MG PO TABS
10.0000 mg | ORAL_TABLET | Freq: Four times a day (QID) | ORAL | Status: DC | PRN
  Filled 2016-11-19: qty 1

## 2016-11-19 MED ORDER — ADULT MULTIVITAMIN W/MINERALS CH
1.0000 | ORAL_TABLET | ORAL | Status: DC
  Administered 2016-11-20 – 2016-11-22 (×2): 1 via ORAL
  Filled 2016-11-19 (×2): qty 1

## 2016-11-19 MED ORDER — IPRATROPIUM-ALBUTEROL 0.5-2.5 (3) MG/3ML IN SOLN
3.0000 mL | Freq: Once | RESPIRATORY_TRACT | Status: AC
  Administered 2016-11-19: 3 mL via RESPIRATORY_TRACT
  Filled 2016-11-19: qty 3

## 2016-11-19 MED ORDER — ENOXAPARIN SODIUM 40 MG/0.4ML ~~LOC~~ SOLN
40.0000 mg | SUBCUTANEOUS | Status: DC
  Administered 2016-11-20 – 2016-11-21 (×2): 40 mg via SUBCUTANEOUS
  Filled 2016-11-19 (×3): qty 0.4

## 2016-11-19 MED ORDER — AMITRIPTYLINE HCL 10 MG PO TABS
10.0000 mg | ORAL_TABLET | Freq: Every day | ORAL | Status: DC
  Administered 2016-11-20 – 2016-11-21 (×3): 10 mg via ORAL
  Filled 2016-11-19 (×4): qty 1

## 2016-11-19 MED ORDER — LORAZEPAM 0.5 MG PO TABS
0.5000 mg | ORAL_TABLET | Freq: Every day | ORAL | Status: DC
  Administered 2016-11-20 – 2016-11-21 (×3): 0.5 mg via ORAL
  Filled 2016-11-19 (×3): qty 1

## 2016-11-19 MED ORDER — ONDANSETRON HCL 4 MG/2ML IJ SOLN
4.0000 mg | Freq: Four times a day (QID) | INTRAMUSCULAR | Status: DC | PRN
Start: 2016-11-19 — End: 2016-11-22

## 2016-11-19 MED ORDER — HYDROCODONE-ACETAMINOPHEN 5-325 MG PO TABS
1.0000 | ORAL_TABLET | ORAL | Status: DC | PRN
  Administered 2016-11-20: 2 via ORAL
  Administered 2016-11-20: 1 via ORAL
  Administered 2016-11-20 – 2016-11-21 (×2): 2 via ORAL
  Filled 2016-11-19 (×3): qty 2
  Filled 2016-11-19: qty 1

## 2016-11-19 MED ORDER — SODIUM CHLORIDE 0.9% FLUSH: 3.0000 mL | INTRAVENOUS | Status: DC | PRN

## 2016-11-19 MED ORDER — MOMETASONE FURO-FORMOTEROL FUM 200-5 MCG/ACT IN AERO
2.0000 | INHALATION_SPRAY | Freq: Two times a day (BID) | RESPIRATORY_TRACT | Status: DC
  Administered 2016-11-20 – 2016-11-22 (×6): 2 via RESPIRATORY_TRACT
  Filled 2016-11-19: qty 8.8

## 2016-11-19 MED ORDER — IBUPROFEN 800 MG PO TABS
800.0000 mg | ORAL_TABLET | Freq: Three times a day (TID) | ORAL | Status: DC | PRN
  Administered 2016-11-22: 800 mg via ORAL
  Filled 2016-11-19: qty 1

## 2016-11-19 MED ORDER — POLYETHYLENE GLYCOL 3350 17 G PO PACK
17.0000 g | PACK | Freq: Every day | ORAL | Status: DC | PRN
  Administered 2016-11-20: 17 g via ORAL
  Filled 2016-11-19: qty 1

## 2016-11-19 NOTE — H&P (Signed)
History and Physical    Tammy Bowers VHQ:[id number] DOB: 1951/03/20 DOA: 11/19/2016  PCP: Lujean Amel, MD   Patient coming from: Home  Chief Complaint: Fevers, malaise, productive cough  HPI: Tammy Bowers is a 65 y.o. female with medical history significant for small cell lung cancer on chemotherapy, COPD, and chronic normocytic anemia who presents to the emergency department with 2 days of fevers at home with cough productive of thick white sputum. Patient underwent her first cycle of chemotherapy with cisplatin and etoposide last month and had been doing well until the insidious development of fevers and cough approximately 2 days ago. Since that time, cough has worsened and she has also been experiencing generalized aches and malaise. She denies any recent long distance travel or sick contacts. She has not been on antibiotics recently. She denies any chest pain or palpitations, denies abdominal pain, flank pain, or dysuria. She also denies vomiting, diarrhea, melena, or hematochezia. She has not attempted any interventions for her symptoms prior to coming in.   ED Course: Upon arrival to the ED, patient is found to be afebrile, saturating adequately on room air, and with vital signs stable. Chest x-ray features a wedge-shaped opacity in the right middle lobe which could represent atelectasis or pneumonia. Chemistry panel is largely unremarkable and CBC is notable for a leukopenia to 1800, stable normocytic anemia with hemoglobin of 11.9, and new mild thrombocytopenia with platelets 139,000. Absolute neutrophil count is 100. Lactic acid is reassuring at 1.59 and urinalysis does not suggest infection. Oncologist was consulted by the ED physician and advised admission to the hospital for empiric antibiotics and cultures. Blood and urine cultures were obtained, patient was treated with DuoNeb and empiric Levaquin, and she will be observed on the medical surgical unit for ongoing evaluation and management  of neutropenia with fevers at home and suspected pulmonary infection.   Review of Systems:  All other systems reviewed and apart from HPI, are negative.  Past Medical History:  Diagnosis Date  . Adenopathy 10/10/2016   PERICARINAL  . Anemia   . Arthritis   . Asthma   . Cancer ([place])   . COPD (chronic obstructive pulmonary disease) ([place])   . Dyspnea   . Headache    migraines  . Hilar mass 10/10/2016   RIGHT  . History of kidney stones   . Insomnia   . Lung nodules 10/10/2016   RIGHT LOWER LOBE  . Mass of both adrenal glands ([place]) 10/10/2016  . Mood swing ([place])   . Pneumonia   . Substance abuse    TOBACCO  . UNSPECIFIED INFECTION OF BONE ANKLE AND FOOT 10/22/2009   Annotation: left ankle Qualifier: Diagnosis of  By: Gustavo Lah      Past Surgical History:  Procedure Laterality Date  . ANKLE SURGERY    . APPENDECTOMY    . LUNG BIOPSY N/A 10/19/2016   Procedure: LUNG BIOPSY;  Surgeon: Grace Isaac, MD;  Location: [facility];  Service: Thoracic;  Laterality: N/A;  . TONSILLECTOMY    . VIDEO BRONCHOSCOPY WITH ENDOBRONCHIAL ULTRASOUND N/A 10/19/2016   Procedure: VIDEO BRONCHOSCOPY WITH ENDOBRONCHIAL ULTRASOUND;  Surgeon: Grace Isaac, MD;  Location: [facility];  Service: Thoracic;  Laterality: N/A;     reports that she quit smoking about 3 months ago. Her smoking use included Cigarettes. She has a 50.00 pack-year smoking history. She has never used smokeless tobacco. She reports that she uses drugs, including Marijuana. She reports that she does not drink alcohol.  Allergies  Allergen Reactions  . Lidocaine Swelling    SWELLING REACTION UNSPECIFIED     Family History  Problem Relation Age of Onset  . Heart disease Father   . Colon cancer Father   . Heart disease Paternal Grandfather   . Ovarian cancer Mother      Prior to Admission medications   Medication Sig Start Date End Date Taking? Authorizing Provider  albuterol (PROVENTIL HFA;VENTOLIN HFA) 108 (90  BASE) MCG/ACT inhaler Inhale 2 puffs into the lungs every 4 (four) hours as needed for wheezing.   Yes Historical Provider, MD  amitriptyline (ELAVIL) 10 MG tablet Take 10 mg by mouth daily. 10/31/16  Yes Historical Provider, MD  Fluticasone-Salmeterol (ADVAIR) 500-50 MCG/DOSE AEPB Inhale 1 puff into the lungs 2 (two) times daily.   Yes Historical Provider, MD  ibuprofen (ADVIL,MOTRIN) 800 MG tablet Take 800 mg by mouth 3 (three) times daily as needed for mild pain.  11/09/16  Yes Historical Provider, MD  LORazepam (ATIVAN) 0.5 MG tablet Take 1 tablet (0.5 mg total) by mouth at bedtime. 10/28/16  Yes Heath Lark, MD  Misc Natural Products (GLUCOSAMINE CHOND COMPLEX/MSM PO) Take 1 tablet by mouth every other day.   Yes Historical Provider, MD  Multiple Vitamin (MULTIVITAMIN WITH MINERALS) TABS tablet Take 1 tablet by mouth every other day. Women's Multivitamin    Yes Historical Provider, MD  Omega-3 Fatty Acids (FISH OIL) 1000 MG CAPS Take 1,000 mg by mouth every other day.   Yes Historical Provider, MD  OVER THE COUNTER MEDICATION Take 1 mL by mouth 2 (two) times daily. CBD Extract   Yes Historical Provider, MD  prochlorperazine (COMPAZINE) 10 MG tablet Take 1 tablet (10 mg total) by mouth every 6 (six) hours as needed for nausea or vomiting. 11/03/16  Yes Curt Bears, MD  tiotropium (SPIRIVA) 18 MCG inhalation capsule Place 18 mcg into inhaler and inhale daily.   Yes Historical Provider, MD    Physical Exam: Vitals:   11/19/16 2130 11/19/16 2215 11/19/16 2224 11/19/16 2300  BP: 119/75 116/58 106/73 (!) 117/106  Pulse: 88 80 87 93  Resp: '17 20 26 22  '$ Temp:      TempSrc:      SpO2: 99% 99% 99% 98%      Constitutional: NAD, calm, comfortable Eyes: PERTLA, lids and conjunctivae normal ENMT: Mucous membranes are moist. Posterior pharynx clear of any exudate or lesions.   Neck: normal, supple, no masses, no thyromegaly Respiratory: Mildly diminished b/l with scattered wheezes. Frequent  cough. Normal respiratory effort. No accessory muscle use.  Cardiovascular: S1 & S2 heard, regular rate and rhythm. No carotid bruits. No significant JVD. Abdomen: No distension, no tenderness, no masses palpated. Bowel sounds normal.  Musculoskeletal: no clubbing / cyanosis. No joint deformity upper and lower extremities. Normal muscle tone.  Skin: no significant rashes, lesions, ulcers. Warm, dry, well-perfused. Neurologic: CN 2-12 grossly intact. Sensation intact, DTR normal. Strength 5/5 in all 4 limbs.  Psychiatric: Normal judgment and insight. Alert and oriented x 3. Normal mood and affect.     Labs on Admission: I have personally reviewed following labs and imaging studies  CBC:  Recent Labs Lab 11/16/16 0822 11/19/16 2021  WBC 2.4* 1.8*  NEUTROABS 0.8* 0.1*  HGB 12.1 11.9*  HCT 36.9 35.8*  MCV 84.8 84.2  PLT 283 242*   Basic Metabolic Panel:  Recent Labs Lab 11/16/16 0822 11/19/16 2021  NA 139 136  K 4.2 3.5  CL  --  101  CO2 25 24  GLUCOSE 82 110*  BUN 17.6 18  CREATININE 0.8 0.98  CALCIUM 9.4 8.8*  MG 1.9  --    GFR: Estimated Creatinine Clearance: 68 mL/min (by C-G formula based on SCr of 0.98 mg/dL). Liver Function Tests:  Recent Labs Lab 11/16/16 0822 11/19/16 2021  AST 16 19  ALT 16 18  ALKPHOS 112 100  BILITOT 0.22 <0.1*  PROT 6.7 6.3*  ALBUMIN 2.9* 3.1*   No results for input(s): LIPASE, AMYLASE in the last 168 hours. No results for input(s): AMMONIA in the last 168 hours. Coagulation Profile:  Recent Labs Lab 11/19/16 2021  INR 0.97   Cardiac Enzymes: No results for input(s): CKTOTAL, CKMB, CKMBINDEX, TROPONINI in the last 168 hours. BNP (last 3 results) No results for input(s): PROBNP in the last 8760 hours. HbA1C: No results for input(s): HGBA1C in the last 72 hours. CBG: No results for input(s): GLUCAP in the last 168 hours. Lipid Profile: No results for input(s): CHOL, HDL, LDLCALC, TRIG, CHOLHDL, LDLDIRECT in the last 72  hours. Thyroid Function Tests: No results for input(s): TSH, T4TOTAL, FREET4, T3FREE, THYROIDAB in the last 72 hours. Anemia Panel: No results for input(s): VITAMINB12, FOLATE, FERRITIN, TIBC, IRON, RETICCTPCT in the last 72 hours. Urine analysis:    Component Value Date/Time   COLORURINE AMBER (A) 11/19/2016 1951   APPEARANCEUR CLOUDY (A) 11/19/2016 1951   LABSPEC 1.023 11/19/2016 1951   PHURINE 5.0 11/19/2016 1951   GLUCOSEU NEGATIVE 11/19/2016 1951   HGBUR NEGATIVE 11/19/2016 1951   [place] (A) 11/19/2016 1951   KETONESUR NEGATIVE 11/19/2016 1951   PROTEINUR NEGATIVE 11/19/2016 1951   NITRITE NEGATIVE 11/19/2016 1951   LEUKOCYTESUR TRACE (A) 11/19/2016 1951   Sepsis Labs: '@LABRCNTIP'$ (procalcitonin:4,lacticidven:4) )No results found for this or any previous visit (from the past 240 hour(s)).   Radiological Exams on Admission: Dg Chest 2 View  Result Date: 11/19/2016 CLINICAL DATA:  Small cell lung cancer low grade temperature EXAM: CHEST  2 VIEW COMPARISON:  10/21/2016, 10/19/2016 FINDINGS: The left lung shows no acute consolidation or pleural effusion. Interval decrease in size of right hilar mass. There is 80 triangular-shaped opacity in the right middle lobe which may reflect atelectasis or infiltrate. This is increased compared with 10/19/2016. Minimal shift of contents to the right suggests mild volume loss. No pneumothorax. IMPRESSION: 1. Right middle lobe wedge-shaped opacity could reflect atelectasis (favored, given mild shift of mediastinal contents to the right suggesting volume loss.) or possibly an infiltrate. 2. Interval decrease in size of the right hilar mass. Electronically Signed   By: Donavan Foil M.D.   On: 11/19/2016 20:39    EKG: Not performed, will obtain as appropriate.   Assessment/Plan  1. Neutropenic fever  - Pt presents with fevers at home, found to have [place] 100  - Oncology was consulted by the ED physician and advised admission for cultures  and empiric treatment of suspected pulmonary infection  - UA features many bacteria, but no pyuria or symptoms suggestive of infection  - All of patient's symptoms are respiratory and CXR suggests possible PNA involving RML  - Blood and urine cultures are incubating, sputum culture and gram stain are pending - Plan to continue empiric treatment with Levaquin while following cultures  - Protective isolation requested   2. Small cell lung cancer   - Followed by Dr. Julien Nordmann of oncology and s/p first cycle of chemotherapy with cisplatin and etoposide  - Right hilar mass appears decreased in size on admission  CXR  - Ongoing management per oncology; there are plans for upcoming second cycle of chemotherapy and initiation of radiation   3. COPD  - Pt is not in any respiratory distress on admission  - There is concern for possible CAP and she is being treated with empiric Levaquin as above  - Continue scheduled ICS/LABA, DuoNeb   4. Pancytopenia  - WBC 1,800 with ANC 100; Hgb 11.9, and platelets 139,000  - There is no sign of bleeding on admission; possible pulmonary infection is being treated as above and cultures are incubating  - Management of neutropenia as above    DVT prophylaxis: sq Lovenox  Code Status: Full  Family Communication: Husband updated at bedside  Disposition Plan: Observe on med-surg Consults called: Oncology consulted by ED physician Admission status: Observation   Vianne Bulls, MD [facility] Pager [phone]  If 7PM-7AM, please contact night-coverage [email or web address] Password [email or web address]  11/19/2016, 11:59 PM

## 2016-11-19 NOTE — ED Provider Notes (Signed)
[name] DEPT Provider Note   CSN: [id number] Arrival date & time: 11/19/16  1855     History   Chief Complaint Chief Complaint  Patient presents with  . Fever    HPI Tammy Bowers is a 65 y.o. female.   Fever   This is a new problem. The current episode started more than 2 days ago. The problem occurs daily. The problem has not changed since onset.The maximum temperature noted was 100 to 100.9 F. Associated symptoms include cough. Pertinent negatives include no chest pain. She has tried nothing for the symptoms.    Past Medical History:  Diagnosis Date  . Adenopathy 10/10/2016   PERICARINAL  . Anemia   . Arthritis   . Asthma   . Cancer ([place])   . COPD (chronic obstructive pulmonary disease) ([place])   . Dyspnea   . Headache    migraines  . Hilar mass 10/10/2016   RIGHT  . History of kidney stones   . Insomnia   . Lung nodules 10/10/2016   RIGHT LOWER LOBE  . Mass of both adrenal glands ([place]) 10/10/2016  . Mood swing ([place])   . Pneumonia   . Substance abuse    TOBACCO  . UNSPECIFIED INFECTION OF BONE ANKLE AND FOOT 10/22/2009   Annotation: left ankle Qualifier: Diagnosis of  By: Gustavo Lah      Patient Active Problem List   Diagnosis Date Noted  . Small cell lung carcinoma, right ([place]) 10/27/2016  . Encounter for antineoplastic chemotherapy 10/27/2016  . Substance abuse   . Hilar mass 10/10/2016  . Lung nodules 10/10/2016  . Adenopathy 10/10/2016  . Mass of both adrenal glands ([place]) 10/10/2016  . Asthma   . COPD (chronic obstructive pulmonary disease) ([place])   . Insomnia   . Mood swing ([place])   . UNSPECIFIED INFECTION OF BONE ANKLE AND FOOT 10/22/2009    Past Surgical History:  Procedure Laterality Date  . ANKLE SURGERY    . APPENDECTOMY    . LUNG BIOPSY N/A 10/19/2016   Procedure: LUNG BIOPSY;  Surgeon: Grace Isaac, MD;  Location: [facility];  Service: Thoracic;  Laterality: N/A;  . TONSILLECTOMY    . VIDEO BRONCHOSCOPY WITH ENDOBRONCHIAL  ULTRASOUND N/A 10/19/2016   Procedure: VIDEO BRONCHOSCOPY WITH ENDOBRONCHIAL ULTRASOUND;  Surgeon: Grace Isaac, MD;  Location: [facility];  Service: Thoracic;  Laterality: N/A;    OB History    No data available       Home Medications    Prior to Admission medications   Medication Sig Start Date End Date Taking? Authorizing Provider  albuterol (PROVENTIL HFA;VENTOLIN HFA) 108 (90 BASE) MCG/ACT inhaler Inhale 2 puffs into the lungs every 4 (four) hours as needed for wheezing.    Historical Provider, MD  amitriptyline (ELAVIL) 10 MG tablet Take 10 mg by mouth daily. 10/31/16   Historical Provider, MD  aspirin 325 MG EC tablet Take 325 mg by mouth 3 (three) times daily as needed for pain.    Historical Provider, MD  Fluticasone-Salmeterol (ADVAIR) 500-50 MCG/DOSE AEPB Inhale 1 puff into the lungs 2 (two) times daily.    Historical Provider, MD  ibuprofen (ADVIL,MOTRIN) 800 MG tablet Take 800 mg by mouth 3 (three) times daily as needed. 11/09/16   Historical Provider, MD  LORazepam (ATIVAN) 0.5 MG tablet Take 1 tablet (0.5 mg total) by mouth at bedtime. 10/28/16   Heath Lark, MD  Misc Natural Products (GLUCOSAMINE CHOND COMPLEX/MSM PO) Take 1 tablet by mouth every  other day.    Historical Provider, MD  Multiple Vitamin (MULTIVITAMIN WITH MINERALS) TABS tablet Take 1 tablet by mouth every other day. Women's Multivitamin     Historical Provider, MD  Omega-3 Fatty Acids (FISH OIL) 1000 MG CAPS Take 1,000 mg by mouth every other day.    Historical Provider, MD  prochlorperazine (COMPAZINE) 10 MG tablet Take 1 tablet (10 mg total) by mouth every 6 (six) hours as needed for nausea or vomiting. 11/03/16   Curt Bears, MD  tiotropium (SPIRIVA) 18 MCG inhalation capsule Place 18 mcg into inhaler and inhale daily.    Historical Provider, MD    Family History Family History  Problem Relation Age of Onset  . Heart disease Father   . Colon cancer Father   . Heart disease Paternal Grandfather   .  Ovarian cancer Mother     Social History Social History  Substance Use Topics  . Smoking status: Former Smoker    Packs/day: 1.00    Years: 50.00    Types: Cigarettes    Quit date: 08/02/2016  . Smokeless tobacco: Never Used  . Alcohol use No     Allergies   Lidocaine   Review of Systems Review of Systems  Constitutional: Positive for fatigue and fever.  Respiratory: Positive for cough, shortness of breath and wheezing.   Cardiovascular: Negative for chest pain.  All other systems reviewed and are negative.    Physical Exam Updated Vital Signs BP 115/81   Pulse 96   Temp 98.8 F (37.1 C) (Oral)   Resp 16   SpO2 99%   Physical Exam  Constitutional: She appears well-developed and well-nourished.  HENT:  Head: Normocephalic and atraumatic.  Eyes: Conjunctivae and EOM are normal.  Neck: Normal range of motion.  Cardiovascular: Normal rate and regular rhythm.   Pulmonary/Chest: No stridor. No respiratory distress. She has wheezes.  Abdominal: Soft. She exhibits no distension. There is no tenderness.  Neurological: She is alert.  Skin: Skin is warm and dry.  Nursing note and vitals reviewed.    ED Treatments / Results  Labs (all labs ordered are listed, but only abnormal results are displayed) Labs Reviewed  COMPREHENSIVE METABOLIC PANEL - Abnormal; Notable for the following:       Result Value   Glucose, Bld 110 (*)    Calcium 8.8 (*)    Total Protein 6.3 (*)    Albumin 3.1 (*)    Total Bilirubin <0.1 (*)    All other components within normal limits  CBC WITH DIFFERENTIAL/PLATELET - Abnormal; Notable for the following:    WBC 1.8 (*)    Hemoglobin 11.9 (*)    HCT 35.8 (*)    Platelets 139 (*)    All other components within normal limits  CULTURE, BLOOD (ROUTINE X 2)  CULTURE, BLOOD (ROUTINE X 2)  URINE CULTURE  PROTIME-INR  URINALYSIS, ROUTINE W REFLEX MICROSCOPIC (NOT AT [facility])  I-STAT BETA HCG BLOOD, ED ([facility], [facility], [facility] ONLY)  I-STAT CG4 LACTIC ACID,  ED    EKG  EKG Interpretation None       Radiology Dg Chest 2 View  Result Date: 11/19/2016 CLINICAL DATA:  Small cell lung cancer low grade temperature EXAM: CHEST  2 VIEW COMPARISON:  10/21/2016, 10/19/2016 FINDINGS: The left lung shows no acute consolidation or pleural effusion. Interval decrease in size of right hilar mass. There is 80 triangular-shaped opacity in the right middle lobe which may reflect atelectasis or infiltrate. This is increased compared with  10/19/2016. Minimal shift of contents to the right suggests mild volume loss. No pneumothorax. IMPRESSION: 1. Right middle lobe wedge-shaped opacity could reflect atelectasis (favored, given mild shift of mediastinal contents to the right suggesting volume loss.) or possibly an infiltrate. 2. Interval decrease in size of the right hilar mass. Electronically Signed   By: Donavan Foil M.D.   On: 11/19/2016 20:39    Procedures Procedures (including critical care time)  Medications Ordered in ED Medications  ipratropium-albuterol (DUONEB) 0.5-2.5 (3) MG/3ML nebulizer solution 3 mL (not administered)     Initial Impression / Assessment and Plan / ED Course  I have reviewed the triage vital signs and the nursing notes.  Pertinent labs & imaging results that were available during my care of the patient were reviewed by me and considered in my medical decision making (see chart for details).  Clinical Course     Here with likely bronchitis but had temp of 100.0 and has neutropenia, so sepsis workup done, Dr. Marin Olp with oncology consulted and recommends admission. D/w hospitalist will admit to med surg.   Final Clinical Impressions(s) / ED Diagnoses   Final diagnoses:  Neutropenia, unspecified type ([place])  Fever, unspecified fever cause    New Prescriptions New Prescriptions   No medications on file     Merrily Pew, MD 11/20/16 [id number]

## 2016-11-19 NOTE — ED Triage Notes (Signed)
The pt is being treated for small cell canger stage 3  She started treatment 2 months ago one round of chemo radiation in the future  She has no porta-cath or hickman tod ay she has had a low grade temp cough white spuitum that started 2 days ago but getting worse  She has a chemo card that lists the things needed for a septic work-up   She has also had chills

## 2016-11-19 NOTE — ED Notes (Signed)
Patient transported to X-ray 

## 2016-11-19 NOTE — ED Notes (Signed)
Pt back from X-ray.  

## 2016-11-20 DIAGNOSIS — Z7951 Long term (current) use of inhaled steroids: Secondary | ICD-10-CM | POA: Diagnosis not present

## 2016-11-20 DIAGNOSIS — J449 Chronic obstructive pulmonary disease, unspecified: Secondary | ICD-10-CM | POA: Diagnosis not present

## 2016-11-20 DIAGNOSIS — J189 Pneumonia, unspecified organism: Secondary | ICD-10-CM | POA: Diagnosis present

## 2016-11-20 DIAGNOSIS — Z9221 Personal history of antineoplastic chemotherapy: Secondary | ICD-10-CM | POA: Diagnosis not present

## 2016-11-20 DIAGNOSIS — Z800 Family history of malignant neoplasm of digestive organs: Secondary | ICD-10-CM | POA: Diagnosis not present

## 2016-11-20 DIAGNOSIS — J440 Chronic obstructive pulmonary disease with acute lower respiratory infection: Secondary | ICD-10-CM | POA: Diagnosis present

## 2016-11-20 DIAGNOSIS — Z8041 Family history of malignant neoplasm of ovary: Secondary | ICD-10-CM | POA: Diagnosis not present

## 2016-11-20 DIAGNOSIS — D61810 Antineoplastic chemotherapy induced pancytopenia: Secondary | ICD-10-CM | POA: Diagnosis present

## 2016-11-20 DIAGNOSIS — Z87891 Personal history of nicotine dependence: Secondary | ICD-10-CM | POA: Diagnosis not present

## 2016-11-20 DIAGNOSIS — T451X5A Adverse effect of antineoplastic and immunosuppressive drugs, initial encounter: Secondary | ICD-10-CM | POA: Diagnosis present

## 2016-11-20 DIAGNOSIS — Z8249 Family history of ischemic heart disease and other diseases of the circulatory system: Secondary | ICD-10-CM | POA: Diagnosis not present

## 2016-11-20 DIAGNOSIS — D701 Agranulocytosis secondary to cancer chemotherapy: Secondary | ICD-10-CM | POA: Diagnosis not present

## 2016-11-20 DIAGNOSIS — C3491 Malignant neoplasm of unspecified part of right bronchus or lung: Secondary | ICD-10-CM | POA: Diagnosis present

## 2016-11-20 DIAGNOSIS — Z87442 Personal history of urinary calculi: Secondary | ICD-10-CM | POA: Diagnosis not present

## 2016-11-20 DIAGNOSIS — D709 Neutropenia, unspecified: Secondary | ICD-10-CM | POA: Diagnosis not present

## 2016-11-20 DIAGNOSIS — R5081 Fever presenting with conditions classified elsewhere: Secondary | ICD-10-CM | POA: Diagnosis present

## 2016-11-20 DIAGNOSIS — R509 Fever, unspecified: Secondary | ICD-10-CM | POA: Diagnosis present

## 2016-11-20 DIAGNOSIS — D703 Neutropenia due to infection: Secondary | ICD-10-CM | POA: Diagnosis present

## 2016-11-20 LAB — CBC WITH DIFFERENTIAL/PLATELET
Basophils Absolute: 0 10*3/uL (ref 0.0–0.1)
Basophils Relative: 3 %
Eosinophils Absolute: 0 10*3/uL (ref 0.0–0.7)
Eosinophils Relative: 3 %
HCT: 32.3 % — ABNORMAL LOW (ref 36.0–46.0)
Hemoglobin: 10.8 g/dL — ABNORMAL LOW (ref 12.0–15.0)
Lymphocytes Relative: 84 %
Lymphs Abs: 1.3 10*3/uL (ref 0.7–4.0)
MCH: 28.3 pg (ref 26.0–34.0)
MCHC: 33.4 g/dL (ref 30.0–36.0)
MCV: 84.8 fL (ref 78.0–100.0)
Monocytes Absolute: 0.1 10*3/uL (ref 0.1–1.0)
Monocytes Relative: 6 %
Neutro Abs: 0.1 10*3/uL — ABNORMAL LOW (ref 1.7–7.7)
Neutrophils Relative %: 4 %
Platelets: 124 10*3/uL — ABNORMAL LOW (ref 150–400)
RBC: 3.81 MIL/uL — ABNORMAL LOW (ref 3.87–5.11)
RDW: 12.7 % (ref 11.5–15.5)
WBC: 1.5 10*3/uL — ABNORMAL LOW (ref 4.0–10.5)

## 2016-11-20 LAB — EXPECTORATED SPUTUM ASSESSMENT W GRAM STAIN, RFLX TO RESP C

## 2016-11-20 LAB — EXPECTORATED SPUTUM ASSESSMENT W REFEX TO RESP CULTURE

## 2016-11-20 MED ORDER — LEVOFLOXACIN IN D5W 750 MG/150ML IV SOLN
750.0000 mg | INTRAVENOUS | Status: DC
Start: 2016-11-21 — End: 2016-11-22
  Administered 2016-11-21: 750 mg via INTRAVENOUS
  Filled 2016-11-20 (×3): qty 150

## 2016-11-20 MED ORDER — IPRATROPIUM-ALBUTEROL 0.5-2.5 (3) MG/3ML IN SOLN: 3.0000 mL | RESPIRATORY_TRACT | Status: DC | PRN

## 2016-11-20 MED ORDER — SODIUM CHLORIDE 0.9 % IV SOLN
INTRAVENOUS | Status: AC
  Administered 2016-11-20: 18:00:00 via INTRAVENOUS
  Administered 2016-11-21: 1000 mL via INTRAVENOUS

## 2016-11-20 NOTE — ED Notes (Signed)
Attempted report x1. Nurse was unavailable for report.

## 2016-11-20 NOTE — Progress Notes (Signed)
Patient ID: Tammy Bowers, female   DOB: May 07, 1951, 65 y.o.   MRN: [id number]     PROGRESS NOTE    Tammy Bowers  MAU:[id number] DOB: 25-Aug-1951 DOA: 11/19/2016  PCP: Lujean Amel, MD   Brief Narrative:   65 y.o. female with medical history significant for small cell lung cancer on chemotherapy, COPD, and chronic normocytic anemia who presents to the emergency department with 2 days of fevers at home with cough productive of thick white sputum. Patient underwent her first cycle of chemotherapy with cisplatin and etoposide last month and had been doing well until the insidious development of fevers and cough approximately 2 days PTA.  Assessment & Plan:    1. Neutropenic fever secondary to RML PNA - Pt presented with fevers at home, found to have [place] 100  - Oncology was consulted by the ED physician and advised admission for cultures and empiric treatment of suspected pulmonary infection  - UA features many bacteria, but no pyuria or symptoms suggestive of infection  - Blood and urine cultures are incubating, sputum culture and gram stain are pending - continue Levaquin day #2 - Protective isolation requested   2. Small cell lung cancer   - Followed by Dr. Julien Nordmann of oncology and s/p first cycle of chemotherapy with cisplatin and etoposide  - Right hilar mass appears decreased in size on admission CXR  - Ongoing management per oncology; there are plans for upcoming second cycle of chemotherapy and initiation of radiation   3. COPD  - Pt is not in any respiratory distress on admission  - There is concern for possible CAP and she is being treated with empiric Levaquin as above  - Continue scheduled ICS/LABA, DuoNeb   4. Pancytopenia  - from chemo  - monitor CBC   DVT prophylaxis: Lovenox SQ Code Status: Full  Family Communication: Patient at bedside  Disposition Plan: Home in 2-3 days   Consultants:   None  Procedures:   None  Antimicrobials:   Levaquin 12/02  -->  Subjective: Pt reports feeling tired and weak. Better compared to yesterday.  Objective: Vitals:   11/20/16 0115 11/20/16 0200 11/20/16 0303 11/20/16 0654  BP: 116/94 124/92 131/85 124/73  Pulse: 96 86 86 78  Resp: '19 14 16 16  '$ Temp:   98.3 F (36.8 C) 98.3 F (36.8 C)  TempSrc:   Oral Oral  SpO2: 98% 94% 96% 96%  Weight:   95.1 kg (209 lb 10.5 oz)   Height:   '5\' 6"'$  (1.676 m)     Intake/Output Summary (Last 24 hours) at 11/20/16 0715 Last data filed at 11/20/16 0658  Gross per 24 hour  Intake              123 ml  Output              500 ml  Net             -377 ml   Filed Weights   11/20/16 0303  Weight: 95.1 kg (209 lb 10.5 oz)    Examination:  General exam: Appears calm and comfortable  Respiratory system: Respiratory effort normal. Diminished breath sounds at bases.  Cardiovascular system: S1 & S2 heard, RRR. No JVD, murmurs, rubs, gallops or clicks. No pedal edema. Gastrointestinal system: Abdomen is nondistended, soft and nontender. No organomegaly or masses felt.   Data Reviewed: I have personally reviewed following labs and imaging studies  CBC:  Recent Labs Lab 11/16/16 0822 11/19/16 2021  WBC 2.4* 1.8*  NEUTROABS 0.8* 0.1*  HGB 12.1 11.9*  HCT 36.9 35.8*  MCV 84.8 84.2  PLT 283 974*   Basic Metabolic Panel:  Recent Labs Lab 11/16/16 0822 11/19/16 2021  NA 139 136  K 4.2 3.5  CL  --  101  CO2 25 24  GLUCOSE 82 110*  BUN 17.6 18  CREATININE 0.8 0.98  CALCIUM 9.4 8.8*  MG 1.9  --    Liver Function Tests:  Recent Labs Lab 11/16/16 0822 11/19/16 2021  AST 16 19  ALT 16 18  ALKPHOS 112 100  BILITOT 0.22 <0.1*  PROT 6.7 6.3*  ALBUMIN 2.9* 3.1*   Coagulation Profile:  Recent Labs Lab 11/19/16 2021  INR 0.97   Urine analysis:    Component Value Date/Time   COLORURINE AMBER (A) 11/19/2016 1951   APPEARANCEUR CLOUDY (A) 11/19/2016 1951   LABSPEC 1.023 11/19/2016 1951   PHURINE 5.0 11/19/2016 1951   GLUCOSEU NEGATIVE  11/19/2016 1951   HGBUR NEGATIVE 11/19/2016 1951   [place] (A) 11/19/2016 1951   KETONESUR NEGATIVE 11/19/2016 1951   PROTEINUR NEGATIVE 11/19/2016 1951   NITRITE NEGATIVE 11/19/2016 1951   LEUKOCYTESUR TRACE (A) 11/19/2016 1951   Radiology Studies: Dg Chest 2 View  Result Date: 11/19/2016 CLINICAL DATA:  Small cell lung cancer low grade temperature EXAM: CHEST  2 VIEW COMPARISON:  10/21/2016, 10/19/2016 FINDINGS: The left lung shows no acute consolidation or pleural effusion. Interval decrease in size of right hilar mass. There is 80 triangular-shaped opacity in the right middle lobe which may reflect atelectasis or infiltrate. This is increased compared with 10/19/2016. Minimal shift of contents to the right suggests mild volume loss. No pneumothorax. IMPRESSION: 1. Right middle lobe wedge-shaped opacity could reflect atelectasis (favored, given mild shift of mediastinal contents to the right suggesting volume loss.) or possibly an infiltrate. 2. Interval decrease in size of the right hilar mass. Electronically Signed   By: Donavan Foil M.D.   On: 11/19/2016 20:39    Scheduled Meds: . amitriptyline  10 mg Oral QHS  . enoxaparin (LOVENOX) injection  40 mg Subcutaneous Q24H  . [START ON 11/21/2016] levofloxacin (LEVAQUIN) IV  750 mg Intravenous Q24H  . LORazepam  0.5 mg Oral QHS  . mometasone-formoterol  2 puff Inhalation BID  . multivitamin with minerals  1 tablet Oral QODAY  . sodium chloride flush  3 mL Intravenous Q12H   Continuous Infusions: . sodium chloride 75 mL/hr at 11/20/16 0129     LOS: 0 days   Time spent: 20 minutes  Faye Ramsay, MD [facility] Pager [phone]  If 7PM-7AM, please contact night-coverage [email or web address] Password [email or web address] 11/20/2016, 7:15 AM

## 2016-11-21 DIAGNOSIS — D701 Agranulocytosis secondary to cancer chemotherapy: Secondary | ICD-10-CM

## 2016-11-21 LAB — URINALYSIS, ROUTINE W REFLEX MICROSCOPIC
Bilirubin Urine: NEGATIVE
Glucose, UA: NEGATIVE mg/dL
Hgb urine dipstick: NEGATIVE
Ketones, ur: NEGATIVE mg/dL
Leukocytes, UA: NEGATIVE
Nitrite: NEGATIVE
Protein, ur: NEGATIVE mg/dL
Specific Gravity, Urine: 1.016 (ref 1.005–1.030)
pH: 5.5 (ref 5.0–8.0)

## 2016-11-21 LAB — CBC
HCT: 31.2 % — ABNORMAL LOW (ref 36.0–46.0)
Hemoglobin: 10.4 g/dL — ABNORMAL LOW (ref 12.0–15.0)
MCH: 28.3 pg (ref 26.0–34.0)
MCHC: 33.3 g/dL (ref 30.0–36.0)
MCV: 84.8 fL (ref 78.0–100.0)
Platelets: 142 10*3/uL — ABNORMAL LOW (ref 150–400)
RBC: 3.68 MIL/uL — ABNORMAL LOW (ref 3.87–5.11)
RDW: 13 % (ref 11.5–15.5)
WBC: 1.6 10*3/uL — ABNORMAL LOW (ref 4.0–10.5)

## 2016-11-21 LAB — URINE CULTURE

## 2016-11-21 LAB — PATHOLOGIST SMEAR REVIEW

## 2016-11-21 LAB — BASIC METABOLIC PANEL
Anion gap: 9 (ref 5–15)
BUN: 14 mg/dL (ref 6–20)
CO2: 24 mmol/L (ref 22–32)
Calcium: 8.6 mg/dL — ABNORMAL LOW (ref 8.9–10.3)
Chloride: 106 mmol/L (ref 101–111)
Creatinine, Ser: 0.9 mg/dL (ref 0.44–1.00)
GFR calc Af Amer: >60 mL/min (ref >60)
GFR calc non Af Amer: >60 mL/min (ref >60)
Glucose, Bld: 83 mg/dL (ref 65–99)
Potassium: 3.8 mmol/L (ref 3.5–5.1)
Sodium: 139 mmol/L (ref 135–145)

## 2016-11-21 NOTE — Progress Notes (Signed)
PROGRESS NOTE    Tammy Bowers  QMV:[id number] DOB: [birth year] DOA: 11/19/2016 PCP: Lujean Amel, MD    Brief Narrative:  65 y.o.femalewith medical history significant for small cell lung cancer on chemotherapy, COPD, and chronic normocytic anemia who presents to the emergency department with 2 days of fevers at home with cough productive of thick white sputum. Patient underwent her first cycle of chemotherapy with cisplatin and etoposide last month and had been doing well until the insidious development of fevers and cough approximately 2 days PTA.   Assessment & Plan:   Principal Problem:   Neutropenic fever ([place]) Active Problems:   COPD (chronic obstructive pulmonary disease) (HCC)   Small cell lung carcinoma, right (HCC)   Pancytopenia due to antineoplastic chemotherapy (HCC)   Neutropenia (HCC)   Bronchitis  Neutropenic fever secondary to RML PNA - Pt presented with fevers at home, found to have [place] 100  - Oncology was consulted by the ED physician and advised admission for cultures and empiric treatment of suspected pulmonary infection  - UA features many bacteria, but no pyuria or symptoms suggestive of infection  - Blood and urine cultures are incubating, sputum culture and gram stain are pending - continue Levaquin day #2 - Protective isolation requested  - Blood culture NG <24hours - Urinalysis reordered   Small cell lung cancer  - Followed by Dr. Julien Nordmann of oncology and s/p first cycle of chemotherapy with cisplatin and etoposide  - Right hilar mass appears decreased in size on admission CXR  - Ongoing management per oncology; there are plans for upcoming second cycle of chemotherapy and initiation of radiation   COPD  - Pt is not in any respiratory distress on admission  - There is concern for possible CAP and she is being treated with empiric Levaquin as above  - Continue scheduled ICS/LABA, DuoNeb   Pancytopenia  - from chemo  - monitor CBC   DVT  prophylaxis: Lovenox SQ Code Status: Full  Family Communication: Husband is bedside Disposition Plan: Home in 1-2 days if no fever and cultures negative    Consultants:   Dr. Earlie Server added to treatment team  Procedures:   none  Antimicrobials:   Levaquin 12/2>    Subjective: Patient and husband are in room, asking about disposition.  Patient voices that she would like to go home at earliest clearance to go home. Patient has not had a fever since admission.  Voices she does not have increased work of breathing and she is not requiring supplemental oxygen.  Denies any chills, body aches or malaise.  Asking about when she could possibly go home.  Objective: Vitals:   11/20/16 1533 11/20/16 2208 11/21/16 0506 11/21/16 0806  BP: (!) 141/66 135/75 117/77   Pulse: 74 69 74   Resp:  17 17   Temp: 98.8 F (37.1 C) 98.4 F (36.9 C) 98.8 F (37.1 C)   TempSrc: Oral Oral Oral   SpO2: 96% 95% 94% 94%  Weight:      Height:        Intake/Output Summary (Last 24 hours) at 11/21/16 0928 Last data filed at 11/21/16 0100  Gross per 24 hour  Intake              905 ml  Output             1650 ml  Net             -745 ml   Filed Weights   11/20/16 0303  Weight: 95.1 kg (209 lb 10.5 oz)    Examination:  General exam: Appears calm and comfortable  Respiratory system: Few scattered crackles, Respiratory effort normal. Cardiovascular system: S1 & S2 heard, RRR. No JVD, murmurs, rubs, gallops or clicks. No pedal edema. Gastrointestinal system: Abdomen is nondistended, soft and nontender. No organomegaly or masses felt. Normal bowel sounds heard. Central nervous system: Alert and oriented. No focal neurological deficits. Extremities: Symmetric 5 x 5 power. Skin: No rashes, lesions or ulcers Psychiatry: Judgement and insight appear normal. Mood & affect appropriate.     Data Reviewed: I have personally reviewed following labs and imaging studies  CBC:  Recent Labs Lab  11/16/16 0822 11/19/16 2021 11/20/16 0618 11/21/16 0427  WBC 2.4* 1.8* 1.5* 1.6*  NEUTROABS 0.8* 0.1* 0.1*  --   HGB 12.1 11.9* 10.8* 10.4*  HCT 36.9 35.8* 32.3* 31.2*  MCV 84.8 84.2 84.8 84.8  PLT 283 139* 124* 992*   Basic Metabolic Panel:  Recent Labs Lab 11/16/16 0822 11/19/16 2021 11/21/16 0427  NA 139 136 139  K 4.2 3.5 3.8  CL  --  101 106  CO2 '25 24 24  '$ GLUCOSE 82 110* 83  BUN 17.'6 18 14  '$ CREATININE 0.8 0.98 0.90  CALCIUM 9.4 8.8* 8.6*  MG 1.9  --   --    GFR: Estimated Creatinine Clearance: 74.3 mL/min (by C-G formula based on SCr of 0.9 mg/dL). Liver Function Tests:  Recent Labs Lab 11/16/16 0822 11/19/16 2021  AST 16 19  ALT 16 18  ALKPHOS 112 100  BILITOT 0.22 <0.1*  PROT 6.7 6.3*  ALBUMIN 2.9* 3.1*   No results for input(s): LIPASE, AMYLASE in the last 168 hours. No results for input(s): AMMONIA in the last 168 hours. Coagulation Profile:  Recent Labs Lab 11/19/16 2021  INR 0.97   Cardiac Enzymes: No results for input(s): CKTOTAL, CKMB, CKMBINDEX, TROPONINI in the last 168 hours. BNP (last 3 results) No results for input(s): PROBNP in the last 8760 hours. HbA1C: No results for input(s): HGBA1C in the last 72 hours. CBG: No results for input(s): GLUCAP in the last 168 hours. Lipid Profile: No results for input(s): CHOL, HDL, LDLCALC, TRIG, CHOLHDL, LDLDIRECT in the last 72 hours. Thyroid Function Tests: No results for input(s): TSH, T4TOTAL, FREET4, T3FREE, THYROIDAB in the last 72 hours. Anemia Panel: No results for input(s): VITAMINB12, FOLATE, FERRITIN, TIBC, IRON, RETICCTPCT in the last 72 hours. Sepsis Labs:  Recent Labs Lab 11/19/16 2039  LATICACIDVEN 1.59    Recent Results (from the past 240 hour(s))  Culture, blood (Routine x 2)     Status: None (Preliminary result)   Collection Time: 11/19/16  7:45 PM  Result Value Ref Range Status   Specimen Description BLOOD RIGHT ARM  Final   Special Requests BOTTLES DRAWN AEROBIC  AND ANAEROBIC 5CC  Final   Culture NO GROWTH < 24 HOURS  Final   Report Status PENDING  Incomplete  Urine culture     Status: Abnormal   Collection Time: 11/19/16  7:51 PM  Result Value Ref Range Status   Specimen Description URINE, CLEAN CATCH  Final   Special Requests NONE  Final   Culture MULTIPLE SPECIES PRESENT, SUGGEST RECOLLECTION (A)  Final   Report Status 11/21/2016 FINAL  Final  Culture, blood (Routine x 2)     Status: None (Preliminary result)   Collection Time: 11/19/16  7:53 PM  Result Value Ref Range Status   Specimen Description BLOOD RIGHT HAND  Final  Special Requests IN PEDIATRIC BOTTLE 4CC  Final   Culture NO GROWTH < 24 HOURS  Final   Report Status PENDING  Incomplete  Culture, sputum-assessment     Status: None   Collection Time: 11/20/16  8:11 AM  Result Value Ref Range Status   Specimen Description SPUTUM  Final   Special Requests Immunocompromised  Final   Sputum evaluation   Final    MICROSCOPIC FINDINGS SUGGEST THAT THIS SPECIMEN IS NOT REPRESENTATIVE OF LOWER RESPIRATORY SECRETIONS. PLEASE RECOLLECT. Gram Stain Report Called to,Read Back By and Verified With: L BLACKWELL,RN AT [id number] 11/20/16 BY L BENFIELD    Report Status 11/20/2016 FINAL  Final         Radiology Studies: Dg Chest 2 View  Result Date: 11/19/2016 CLINICAL DATA:  Small cell lung cancer low grade temperature EXAM: CHEST  2 VIEW COMPARISON:  10/21/2016, 10/19/2016 FINDINGS: The left lung shows no acute consolidation or pleural effusion. Interval decrease in size of right hilar mass. There is 80 triangular-shaped opacity in the right middle lobe which may reflect atelectasis or infiltrate. This is increased compared with 10/19/2016. Minimal shift of contents to the right suggests mild volume loss. No pneumothorax. IMPRESSION: 1. Right middle lobe wedge-shaped opacity could reflect atelectasis (favored, given mild shift of mediastinal contents to the right suggesting volume loss.) or possibly  an infiltrate. 2. Interval decrease in size of the right hilar mass. Electronically Signed   By: Donavan Foil M.D.   On: 11/19/2016 20:39        Scheduled Meds: . amitriptyline  10 mg Oral QHS  . enoxaparin (LOVENOX) injection  40 mg Subcutaneous Q24H  . levofloxacin (LEVAQUIN) IV  750 mg Intravenous Q24H  . LORazepam  0.5 mg Oral QHS  . mometasone-formoterol  2 puff Inhalation BID  . multivitamin with minerals  1 tablet Oral QODAY  . sodium chloride flush  3 mL Intravenous Q12H   Continuous Infusions:   LOS: 1 day    Time spent: 25 minutes    Loretha Stapler, MD [facility] Pager [phone]  If 7PM-7AM, please contact night-coverage [email or web address] Password [email or web address] 11/21/2016, 9:28 AM

## 2016-11-22 LAB — CBC WITH DIFFERENTIAL/PLATELET
Basophils Absolute: 0 10*3/uL (ref 0.0–0.1)
Basophils Relative: 1 %
Eosinophils Absolute: 0.1 10*3/uL (ref 0.0–0.7)
Eosinophils Relative: 4 %
HCT: 32.7 % — ABNORMAL LOW (ref 36.0–46.0)
Hemoglobin: 10.9 g/dL — ABNORMAL LOW (ref 12.0–15.0)
Lymphocytes Relative: 79 %
Lymphs Abs: 1.5 10*3/uL (ref 0.7–4.0)
MCH: 28.4 pg (ref 26.0–34.0)
MCHC: 33.3 g/dL (ref 30.0–36.0)
MCV: 85.2 fL (ref 78.0–100.0)
Monocytes Absolute: 0.2 10*3/uL (ref 0.1–1.0)
Monocytes Relative: 12 %
Neutro Abs: 0.1 10*3/uL — ABNORMAL LOW (ref 1.7–7.7)
Neutrophils Relative %: 4 %
Platelets: 181 10*3/uL (ref 150–400)
RBC: 3.84 MIL/uL — ABNORMAL LOW (ref 3.87–5.11)
RDW: 13.1 % (ref 11.5–15.5)
WBC: 1.9 10*3/uL — ABNORMAL LOW (ref 4.0–10.5)

## 2016-11-22 LAB — RESPIRATORY PANEL BY PCR

## 2016-11-22 MED ORDER — LEVOFLOXACIN 750 MG PO TABS: 750.0000 mg | ORAL_TABLET | Freq: Every day | ORAL | 0 refills | Status: AC

## 2016-11-22 NOTE — Discharge Summary (Signed)
Physician Discharge Summary  Tammy Bowers RUE:[id number] DOB: March 17, 1951 DOA: 11/19/2016  PCP: Lujean Amel, MD  Admit date: 11/19/2016 Discharge date: 11/22/2016  Admitted From: Home  Disposition:  Home  Recommendations for Outpatient Follow-up:  1. Follow up with PCP in 1-2 weeks 2. Keep previously scheduled appointments with Oncology 3. Call oncology Clinic and return to hospital with any fever 4. Please obtain labs per oncology  Home Health: No Equipment/Devices: None   Discharge Condition: Stable CODE STATUS: Full code  Diet recommendation: Regular diet  Brief/Interim Summary: 65 y.o.femalewith medical history significant for small cell lung cancer on chemotherapy, COPD, and chronic normocytic anemia who presents to the emergency department with 2 days of fevers at home with cough productive of thick white sputum. Patient underwent her first cycle of chemotherapy with cisplatin and etoposide last month and had been doing well until the insidious development of fevers and cough approximately 2 days PTA.  Discharge Diagnoses:  Principal Problem:   Neutropenic fever ([place]) Active Problems:   COPD (chronic obstructive pulmonary disease) (HCC)   Small cell lung carcinoma, right (HCC)   Pancytopenia due to antineoplastic chemotherapy (HCC)   Neutropenia (HCC)   Bronchitis  Neutropenic fever secondary to RML PNA - Pt presentedwith fevers at home, found to have [place] 100  - Oncology was consulted by the ED physician and advised admission for cultures and empiric treatment of suspected pulmonary infection  - UA features many bacteria, but no pyuria or symptoms suggestive of infection  - Blood culture no growth - urine collection shows many species and recollection was suggested - recollection of urine shows still unclean catch but few bacteria and Nitrite and leukocyte esterase negative - continue Levaquin day #3 (will continue today and then 2 additional days) - Protective  isolation requested  - will go to cancer center tomorrow for repeat blood tests - discussed with Dr. Earlie Server who feels as though patient is stable to discharge and follow up at previously scheduled appointments   Small cell lung cancer  - Followed by Dr. Julien Nordmann of oncology and s/p first cycle of chemotherapy with cisplatin and etoposide  - Right hilar mass appears decreased in size on admission CXR  - Ongoing management per oncology; there are plans for upcoming second cycle of chemotherapy and initiation of radiation   COPD  - Pt is not in any respiratory distress on admission  - There is concern for possible CAP and she is being treated with empiric Levaquin as above  - Continue scheduled ICS/LABA, DuoNeb   Pancytopenia  - from chemo  - monitor CBC   Discharge Instructions  Discharge Instructions    Call MD for:  difficulty breathing, headache or visual disturbances    Complete by:  As directed    Call MD for:  extreme fatigue    Complete by:  As directed    Call MD for:  hives    Complete by:  As directed    Call MD for:  persistant dizziness or light-headedness    Complete by:  As directed    Call MD for:  persistant nausea and vomiting    Complete by:  As directed    Call MD for:  severe uncontrolled pain    Complete by:  As directed    Call MD for:  temperature >100.4    Complete by:  As directed    Diet general    Complete by:  As directed    Increase activity slowly    Complete  by:  As directed        Medication List    TAKE these medications   albuterol 108 (90 Base) MCG/ACT inhaler Commonly known as:  PROVENTIL HFA;VENTOLIN HFA Inhale 2 puffs into the lungs every 4 (four) hours as needed for wheezing.   amitriptyline 10 MG tablet Commonly known as:  ELAVIL Take 10 mg by mouth daily.   Fish Oil 1000 MG Caps Take 1,000 mg by mouth every other day.   Fluticasone-Salmeterol 500-50 MCG/DOSE Aepb Commonly known as:  ADVAIR Inhale 1 puff into the  lungs 2 (two) times daily.   GLUCOSAMINE CHOND COMPLEX/MSM PO Take 1 tablet by mouth every other day.   ibuprofen 800 MG tablet Commonly known as:  ADVIL,MOTRIN Take 800 mg by mouth 3 (three) times daily as needed for mild pain.   levofloxacin 750 MG tablet Commonly known as:  LEVAQUIN Take 1 tablet (750 mg total) by mouth daily.   LORazepam 0.5 MG tablet Commonly known as:  ATIVAN Take 1 tablet (0.5 mg total) by mouth at bedtime.   multivitamin with minerals Tabs tablet Take 1 tablet by mouth every other day. Women's Multivitamin   OVER THE COUNTER MEDICATION Take 1 mL by mouth 2 (two) times daily. CBD Extract   prochlorperazine 10 MG tablet Commonly known as:  COMPAZINE Take 1 tablet (10 mg total) by mouth every 6 (six) hours as needed for nausea or vomiting.   tiotropium 18 MCG inhalation capsule Commonly known as:  SPIRIVA Place 18 mcg into inhaler and inhale daily.       Allergies  Allergen Reactions  . Lidocaine Swelling    SWELLING REACTION UNSPECIFIED     Consultations:  Discussed with Dr. Earlie Server on the phone   Procedures/Studies: Dg Chest 2 View  Result Date: 11/19/2016 CLINICAL DATA:  Small cell lung cancer low grade temperature EXAM: CHEST  2 VIEW COMPARISON:  10/21/2016, 10/19/2016 FINDINGS: The left lung shows no acute consolidation or pleural effusion. Interval decrease in size of right hilar mass. There is 80 triangular-shaped opacity in the right middle lobe which may reflect atelectasis or infiltrate. This is increased compared with 10/19/2016. Minimal shift of contents to the right suggests mild volume loss. No pneumothorax. IMPRESSION: 1. Right middle lobe wedge-shaped opacity could reflect atelectasis (favored, given mild shift of mediastinal contents to the right suggesting volume loss.) or possibly an infiltrate. 2. Interval decrease in size of the right hilar mass. Electronically Signed   By: Donavan Foil M.D.   On: 11/19/2016 20:39       Subjective: Patient seen and examined.  She is anxious to go home.  She denies fevers, chills, increased work of breathing, chest pain, chest pressure, shortness of breath.  She said the cancer center called her to come in tomorrow for repeat blood work.   Discharge Exam: Vitals:   11/21/16 2145 11/22/16 0534  BP: 139/85 128/80  Pulse: 81 74  Resp: 18 17  Temp: 98.7 F (37.1 C) 98.6 F (37 C)   Vitals:   11/21/16 2107 11/21/16 2145 11/22/16 0534 11/22/16 1138  BP:  139/85 128/80   Pulse: 79 81 74   Resp: '18 18 17   '$ Temp:  98.7 F (37.1 C) 98.6 F (37 C)   TempSrc:  Oral Oral   SpO2: 96% 95% 94% 95%  Weight:      Height:        General: Pt is alert, awake, not in acute distress Cardiovascular: RRR, S1/S2 +, no  rubs, no gallops Respiratory: CTA bilaterally, no wheezing, no rhonchi Abdominal: Soft, NT, ND, bowel sounds + Extremities: no edema, no cyanosis    The results of significant diagnostics from this hospitalization (including imaging, microbiology, ancillary and laboratory) are listed below for reference.     Microbiology: Recent Results (from the past 240 hour(s))  Culture, blood (Routine x 2)     Status: None (Preliminary result)   Collection Time: 11/19/16  7:45 PM  Result Value Ref Range Status   Specimen Description BLOOD RIGHT ARM  Final   Special Requests BOTTLES DRAWN AEROBIC AND ANAEROBIC 5CC  Final   Culture NO GROWTH 2 DAYS  Final   Report Status PENDING  Incomplete  Urine culture     Status: Abnormal   Collection Time: 11/19/16  7:51 PM  Result Value Ref Range Status   Specimen Description URINE, CLEAN CATCH  Final   Special Requests NONE  Final   Culture MULTIPLE SPECIES PRESENT, SUGGEST RECOLLECTION (A)  Final   Report Status 11/21/2016 FINAL  Final  Culture, blood (Routine x 2)     Status: None (Preliminary result)   Collection Time: 11/19/16  7:53 PM  Result Value Ref Range Status   Specimen Description BLOOD RIGHT HAND  Final    Special Requests IN PEDIATRIC BOTTLE 4CC  Final   Culture NO GROWTH 2 DAYS  Final   Report Status PENDING  Incomplete  Culture, sputum-assessment     Status: None   Collection Time: 11/20/16  8:11 AM  Result Value Ref Range Status   Specimen Description SPUTUM  Final   Special Requests Immunocompromised  Final   Sputum evaluation   Final    MICROSCOPIC FINDINGS SUGGEST THAT THIS SPECIMEN IS NOT REPRESENTATIVE OF LOWER RESPIRATORY SECRETIONS. PLEASE RECOLLECT. Gram Stain Report Called to,Read Back By and Verified With: L BLACKWELL,RN AT [id number] 11/20/16 BY L BENFIELD    Report Status 11/20/2016 FINAL  Final  Respiratory Panel by PCR     Status: None   Collection Time: 11/22/16 10:31 AM  Result Value Ref Range Status   Adenovirus NOT DETECTED NOT DETECTED Final   Coronavirus 229E NOT DETECTED NOT DETECTED Final   Coronavirus HKU1 NOT DETECTED NOT DETECTED Final   Coronavirus NL63 NOT DETECTED NOT DETECTED Final   Coronavirus OC43 NOT DETECTED NOT DETECTED Final   Metapneumovirus NOT DETECTED NOT DETECTED Final   Rhinovirus / Enterovirus NOT DETECTED NOT DETECTED Final   Influenza A NOT DETECTED NOT DETECTED Final   Influenza B NOT DETECTED NOT DETECTED Final   Parainfluenza Virus 1 NOT DETECTED NOT DETECTED Final   Parainfluenza Virus 2 NOT DETECTED NOT DETECTED Final   Parainfluenza Virus 3 NOT DETECTED NOT DETECTED Final   Parainfluenza Virus 4 NOT DETECTED NOT DETECTED Final   Respiratory Syncytial Virus NOT DETECTED NOT DETECTED Final   Bordetella pertussis NOT DETECTED NOT DETECTED Final   Chlamydophila pneumoniae NOT DETECTED NOT DETECTED Final   Mycoplasma pneumoniae NOT DETECTED NOT DETECTED Final     Labs: BNP (last 3 results) No results for input(s): BNP in the last 8760 hours. Basic Metabolic Panel:  Recent Labs Lab 11/16/16 0822 11/19/16 2021 11/21/16 0427  NA 139 136 139  K 4.2 3.5 3.8  CL  --  101 106  CO2 '25 24 24  '$ GLUCOSE 82 110* 83  BUN 17.'6 18 14   '$ CREATININE 0.8 0.98 0.90  CALCIUM 9.4 8.8* 8.6*  MG 1.9  --   --    Liver  Function Tests:  Recent Labs Lab 11/16/16 0822 11/19/16 2021  AST 16 19  ALT 16 18  ALKPHOS 112 100  BILITOT 0.22 <0.1*  PROT 6.7 6.3*  ALBUMIN 2.9* 3.1*   No results for input(s): LIPASE, AMYLASE in the last 168 hours. No results for input(s): AMMONIA in the last 168 hours. CBC:  Recent Labs Lab 11/16/16 0822 11/19/16 2021 11/20/16 0618 11/21/16 0427 11/22/16 0611  WBC 2.4* 1.8* 1.5* 1.6* 1.9*  NEUTROABS 0.8* 0.1* 0.1*  --  0.1*  HGB 12.1 11.9* 10.8* 10.4* 10.9*  HCT 36.9 35.8* 32.3* 31.2* 32.7*  MCV 84.8 84.2 84.8 84.8 85.2  PLT 283 139* 124* 142* 181   Cardiac Enzymes: No results for input(s): CKTOTAL, CKMB, CKMBINDEX, TROPONINI in the last 168 hours. BNP: Invalid input(s): POCBNP CBG: No results for input(s): GLUCAP in the last 168 hours. D-Dimer No results for input(s): DDIMER in the last 72 hours. Hgb A1c No results for input(s): HGBA1C in the last 72 hours. Lipid Profile No results for input(s): CHOL, HDL, LDLCALC, TRIG, CHOLHDL, LDLDIRECT in the last 72 hours. Thyroid function studies No results for input(s): TSH, T4TOTAL, T3FREE, THYROIDAB in the last 72 hours.  Invalid input(s): FREET3 Anemia work up No results for input(s): VITAMINB12, FOLATE, FERRITIN, TIBC, IRON, RETICCTPCT in the last 72 hours. Urinalysis    Component Value Date/Time   COLORURINE YELLOW 11/21/2016 1833   APPEARANCEUR CLEAR 11/21/2016 1833   LABSPEC 1.016 11/21/2016 1833   PHURINE 5.5 11/21/2016 1833   GLUCOSEU NEGATIVE 11/21/2016 1833   HGBUR NEGATIVE 11/21/2016 1833   BILIRUBINUR NEGATIVE 11/21/2016 1833   KETONESUR NEGATIVE 11/21/2016 1833   PROTEINUR NEGATIVE 11/21/2016 1833   NITRITE NEGATIVE 11/21/2016 1833   LEUKOCYTESUR NEGATIVE 11/21/2016 1833   Sepsis Labs Invalid input(s): PROCALCITONIN,  WBC,  LACTICIDVEN Microbiology Recent Results (from the past 240 hour(s))  Culture, blood  (Routine x 2)     Status: None (Preliminary result)   Collection Time: 11/19/16  7:45 PM  Result Value Ref Range Status   Specimen Description BLOOD RIGHT ARM  Final   Special Requests BOTTLES DRAWN AEROBIC AND ANAEROBIC 5CC  Final   Culture NO GROWTH 2 DAYS  Final   Report Status PENDING  Incomplete  Urine culture     Status: Abnormal   Collection Time: 11/19/16  7:51 PM  Result Value Ref Range Status   Specimen Description URINE, CLEAN CATCH  Final   Special Requests NONE  Final   Culture MULTIPLE SPECIES PRESENT, SUGGEST RECOLLECTION (A)  Final   Report Status 11/21/2016 FINAL  Final  Culture, blood (Routine x 2)     Status: None (Preliminary result)   Collection Time: 11/19/16  7:53 PM  Result Value Ref Range Status   Specimen Description BLOOD RIGHT HAND  Final   Special Requests IN PEDIATRIC BOTTLE 4CC  Final   Culture NO GROWTH 2 DAYS  Final   Report Status PENDING  Incomplete  Culture, sputum-assessment     Status: None   Collection Time: 11/20/16  8:11 AM  Result Value Ref Range Status   Specimen Description SPUTUM  Final   Special Requests Immunocompromised  Final   Sputum evaluation   Final    MICROSCOPIC FINDINGS SUGGEST THAT THIS SPECIMEN IS NOT REPRESENTATIVE OF LOWER RESPIRATORY SECRETIONS. PLEASE RECOLLECT. Gram Stain Report Called to,Read Back By and Verified With: L BLACKWELL,RN AT [id number] 11/20/16 BY L BENFIELD    Report Status 11/20/2016 FINAL  Final  Respiratory Panel by PCR  Status: None   Collection Time: 11/22/16 10:31 AM  Result Value Ref Range Status   Adenovirus NOT DETECTED NOT DETECTED Final   Coronavirus 229E NOT DETECTED NOT DETECTED Final   Coronavirus HKU1 NOT DETECTED NOT DETECTED Final   Coronavirus NL63 NOT DETECTED NOT DETECTED Final   Coronavirus OC43 NOT DETECTED NOT DETECTED Final   Metapneumovirus NOT DETECTED NOT DETECTED Final   Rhinovirus / Enterovirus NOT DETECTED NOT DETECTED Final   Influenza A NOT DETECTED NOT DETECTED Final    Influenza B NOT DETECTED NOT DETECTED Final   Parainfluenza Virus 1 NOT DETECTED NOT DETECTED Final   Parainfluenza Virus 2 NOT DETECTED NOT DETECTED Final   Parainfluenza Virus 3 NOT DETECTED NOT DETECTED Final   Parainfluenza Virus 4 NOT DETECTED NOT DETECTED Final   Respiratory Syncytial Virus NOT DETECTED NOT DETECTED Final   Bordetella pertussis NOT DETECTED NOT DETECTED Final   Chlamydophila pneumoniae NOT DETECTED NOT DETECTED Final   Mycoplasma pneumoniae NOT DETECTED NOT DETECTED Final     Time coordinating discharge: Over 30 minutes  SIGNED:   Loretha Stapler, MD  [facility] 11/22/2016, 2:18 PM Pager [phone] If 7PM-7AM, please contact night-coverage [email or web address] Password [email or web address]

## 2016-11-22 NOTE — Progress Notes (Signed)
Pt was ambulating independently to the hall. Denies SOC, no chestpain. Ready to go home today, per pt.  Discharge instructions given to pt, verbalized understanding. Discharged home accompanied by spouse.

## 2016-11-23 ENCOUNTER — Other Ambulatory Visit: Payer: BLUE CROSS/BLUE SHIELD

## 2016-11-24 LAB — CULTURE, BLOOD (ROUTINE X 2)
Culture: NO GROWTH
Culture: NO GROWTH

## 2016-11-28 ENCOUNTER — Other Ambulatory Visit: Payer: Self-pay | Admitting: *Deleted

## 2016-11-28 ENCOUNTER — Ambulatory Visit (HOSPITAL_BASED_OUTPATIENT_CLINIC_OR_DEPARTMENT_OTHER): Payer: BLUE CROSS/BLUE SHIELD

## 2016-11-28 ENCOUNTER — Ambulatory Visit (HOSPITAL_BASED_OUTPATIENT_CLINIC_OR_DEPARTMENT_OTHER): Payer: BLUE CROSS/BLUE SHIELD | Admitting: Internal Medicine

## 2016-11-28 ENCOUNTER — Other Ambulatory Visit (HOSPITAL_BASED_OUTPATIENT_CLINIC_OR_DEPARTMENT_OTHER): Payer: BLUE CROSS/BLUE SHIELD

## 2016-11-28 ENCOUNTER — Encounter: Payer: Self-pay | Admitting: Internal Medicine

## 2016-11-28 ENCOUNTER — Telehealth: Payer: Self-pay | Admitting: Internal Medicine

## 2016-11-28 VITALS — BP 139/71 | HR 92 | Temp 98.7°F | Resp 18 | Ht 66.0 in | Wt 209.5 lb

## 2016-11-28 DIAGNOSIS — Z5111 Encounter for antineoplastic chemotherapy: Secondary | ICD-10-CM | POA: Diagnosis not present

## 2016-11-28 DIAGNOSIS — C3411 Malignant neoplasm of upper lobe, right bronchus or lung: Secondary | ICD-10-CM

## 2016-11-28 DIAGNOSIS — R5383 Other fatigue: Secondary | ICD-10-CM | POA: Diagnosis not present

## 2016-11-28 DIAGNOSIS — D61810 Antineoplastic chemotherapy induced pancytopenia: Secondary | ICD-10-CM

## 2016-11-28 DIAGNOSIS — T451X5A Adverse effect of antineoplastic and immunosuppressive drugs, initial encounter: Secondary | ICD-10-CM

## 2016-11-28 DIAGNOSIS — C3491 Malignant neoplasm of unspecified part of right bronchus or lung: Secondary | ICD-10-CM

## 2016-11-28 DIAGNOSIS — J449 Chronic obstructive pulmonary disease, unspecified: Secondary | ICD-10-CM

## 2016-11-28 DIAGNOSIS — I10 Essential (primary) hypertension: Secondary | ICD-10-CM

## 2016-11-28 LAB — CBC WITH DIFFERENTIAL/PLATELET
BASO%: 0.9 % (ref 0.0–2.0)
Basophils Absolute: 0.1 10*3/uL (ref 0.0–0.1)
EOS%: 1.2 % (ref 0.0–7.0)
Eosinophils Absolute: 0.1 10*3/uL (ref 0.0–0.5)
HCT: 38.1 % (ref 34.8–46.6)
HGB: 12.6 g/dL (ref 11.6–15.9)
LYMPH%: 25.9 % (ref 14.0–49.7)
MCH: 28 pg (ref 25.1–34.0)
MCHC: 33.1 g/dL (ref 31.5–36.0)
MCV: 84.6 fL (ref 79.5–101.0)
MONO#: 1.1 10*3/uL — ABNORMAL HIGH (ref 0.1–0.9)
MONO%: 12.7 % (ref 0.0–14.0)
NEUT#: 5 10*3/uL (ref 1.5–6.5)
NEUT%: 59.3 % (ref 38.4–76.8)
Platelets: 634 10*3/uL — ABNORMAL HIGH (ref 145–400)
RBC: 4.51 10*6/uL (ref 3.70–5.45)
RDW: 14.1 % (ref 11.2–14.5)
WBC: 8.4 10*3/uL (ref 3.9–10.3)
lymph#: 2.2 10*3/uL (ref 0.9–3.3)

## 2016-11-28 LAB — MAGNESIUM: Magnesium: 2 mg/dl (ref 1.5–2.5)

## 2016-11-28 LAB — COMPREHENSIVE METABOLIC PANEL
ALT: 14 U/L (ref 0–55)
AST: 22 U/L (ref 5–34)
Albumin: 3.2 g/dL — ABNORMAL LOW (ref 3.5–5.0)
Alkaline Phosphatase: 128 U/L (ref 40–150)
Anion Gap: 10 mEq/L (ref 3–11)
BUN: 15.6 mg/dL (ref 7.0–26.0)
CO2: 23 mEq/L (ref 22–29)
Calcium: 9.6 mg/dL (ref 8.4–10.4)
Chloride: 105 mEq/L (ref 98–109)
Creatinine: 0.8 mg/dL (ref 0.6–1.1)
EGFR: 81 mL/min/{1.73_m2} — ABNORMAL LOW (ref >90)
Glucose: 85 mg/dl (ref 70–140)
Potassium: 4.1 mEq/L (ref 3.5–5.1)
Sodium: 138 mEq/L (ref 136–145)
Total Bilirubin: 0.23 mg/dL (ref 0.20–1.20)
Total Protein: 6.7 g/dL (ref 6.4–8.3)

## 2016-11-28 LAB — TECHNOLOGIST REVIEW

## 2016-11-28 MED ORDER — POTASSIUM CHLORIDE 2 MEQ/ML IV SOLN
Freq: Once | INTRAVENOUS | Status: AC
  Administered 2016-11-28: 12:00:00 via INTRAVENOUS
  Filled 2016-11-28: qty 10

## 2016-11-28 MED ORDER — SODIUM CHLORIDE 0.9 % IV SOLN
120.0000 mg/m2 | Freq: Once | INTRAVENOUS | Status: AC
  Administered 2016-11-28: 250 mg via INTRAVENOUS
  Filled 2016-11-28: qty 12.5

## 2016-11-28 MED ORDER — CISPLATIN CHEMO INJECTION 100MG/100ML
60.0000 mg/m2 | Freq: Once | INTRAVENOUS | Status: AC
  Administered 2016-11-28: 127 mg via INTRAVENOUS
  Filled 2016-11-28: qty 127

## 2016-11-28 MED ORDER — PALONOSETRON HCL INJECTION 0.25 MG/5ML
INTRAVENOUS | Status: AC
  Filled 2016-11-28: qty 5

## 2016-11-28 MED ORDER — SODIUM CHLORIDE 0.9 % IV SOLN
Freq: Once | INTRAVENOUS | Status: AC
  Administered 2016-11-28: 12:00:00 via INTRAVENOUS

## 2016-11-28 MED ORDER — SODIUM CHLORIDE 0.9 % IV SOLN
Freq: Once | INTRAVENOUS | Status: AC
  Administered 2016-11-28: 14:00:00 via INTRAVENOUS
  Filled 2016-11-28: qty 5

## 2016-11-28 MED ORDER — PALONOSETRON HCL INJECTION 0.25 MG/5ML
0.2500 mg | Freq: Once | INTRAVENOUS | Status: AC
  Administered 2016-11-28: 0.25 mg via INTRAVENOUS

## 2016-11-28 NOTE — Progress Notes (Signed)
Pt voided    850 cc pre CDDP ;  Voided   675 cc post CDDP.

## 2016-11-28 NOTE — Progress Notes (Signed)
[place] Telephone:([phone]   [phone]  OFFICE PROGRESS NOTE  Lujean Amel, MD [address] [place] [address]  DIAGNOSIS: Limited stage (T2b, N2, M0) small cell lung cancer diagnosed in November 2017 and presented with large right hilar mass with mediastinal invasion and right lower lobe pulmonary nodule.  PRIOR THERAPY: None  CURRENT THERAPY: Systemic chemotherapy with cisplatin 60 MG/M2 on day 1 and etoposide 120 MG/M2 on days 1, 2 and 3 concurrent with radiotherapy. Status post one cycle.  INTERVAL HISTORY: Tammy Bowers 65 y.o. female returns to the clinic today for follow-up visit accompanied by her boyfriend. The patient is feeling a little bit better today. Her shortness of breath had improved. She was recently admitted to [facility] with neutropenic fever. She did not receive Neulasta injection after the last dose of chemotherapy because of denial of insurance coverage. She continues to have aching pain all over her body as well as muscle spasm in the neck. She is also complaining of fatigue. She denied having significant weight loss. She has no current nausea or vomiting and she is using cannabinoids oil and it did help her nausea. She is here today for evaluation before starting cycle #2.  MEDICAL HISTORY: Past Medical History:  Diagnosis Date  . Adenopathy 10/10/2016   PERICARINAL  . Anemia   . Arthritis   . Asthma   . Cancer ([place])   . COPD (chronic obstructive pulmonary disease) ([place])   . Dyspnea   . Headache    migraines  . Hilar mass 10/10/2016   RIGHT  . History of kidney stones   . Insomnia   . Lung nodules 10/10/2016   RIGHT LOWER LOBE  . Mass of both adrenal glands ([place]) 10/10/2016  . Mood swing ([place])   . Pneumonia   . Substance abuse    TOBACCO  . UNSPECIFIED INFECTION OF BONE ANKLE AND FOOT 10/22/2009   Annotation: left ankle Qualifier: Diagnosis of  By: Patsy Baltimore RN, Denise       ALLERGIES:  is allergic to lidocaine.  MEDICATIONS:  Current Outpatient Prescriptions  Medication Sig Dispense Refill  . albuterol (PROVENTIL HFA;VENTOLIN HFA) 108 (90 BASE) MCG/ACT inhaler Inhale 2 puffs into the lungs every 4 (four) hours as needed for wheezing.    Marland Kitchen amitriptyline (ELAVIL) 10 MG tablet Take 10 mg by mouth daily.  0  . Fluticasone-Salmeterol (ADVAIR) 500-50 MCG/DOSE AEPB Inhale 1 puff into the lungs 2 (two) times daily.    Marland Kitchen ibuprofen (ADVIL,MOTRIN) 800 MG tablet Take 800 mg by mouth 3 (three) times daily as needed for mild pain.   0  . LORazepam (ATIVAN) 0.5 MG tablet Take 1 tablet (0.5 mg total) by mouth at bedtime. 20 tablet 0  . Misc Natural Products (GLUCOSAMINE CHOND COMPLEX/MSM PO) Take 1 tablet by mouth every other day.    . Multiple Vitamin (MULTIVITAMIN WITH MINERALS) TABS tablet Take 1 tablet by mouth every other day. Women's Multivitamin     . Omega-3 Fatty Acids (FISH OIL) 1000 MG CAPS Take 1,000 mg by mouth every other day.    Marland Kitchen OVER THE COUNTER MEDICATION Take 1 mL by mouth 2 (two) times daily. CBD Extract    . prochlorperazine (COMPAZINE) 10 MG tablet Take 1 tablet (10 mg total) by mouth every 6 (six) hours as needed for nausea or vomiting. 30 tablet 0  . tiotropium (SPIRIVA) 18 MCG inhalation capsule Place 18 mcg into inhaler and inhale  daily.     No current facility-administered medications for this visit.     SURGICAL HISTORY:  Past Surgical History:  Procedure Laterality Date  . ANKLE SURGERY    . APPENDECTOMY    . LUNG BIOPSY N/A 10/19/2016   Procedure: LUNG BIOPSY;  Surgeon: Grace Isaac, MD;  Location: [place];  Service: Thoracic;  Laterality: N/A;  . TONSILLECTOMY    . VIDEO BRONCHOSCOPY WITH ENDOBRONCHIAL ULTRASOUND N/A 10/19/2016   Procedure: VIDEO BRONCHOSCOPY WITH ENDOBRONCHIAL ULTRASOUND;  Surgeon: Grace Isaac, MD;  Location: [place];  Service: Thoracic;  Laterality: N/A;    REVIEW OF SYSTEMS:  Constitutional: positive for  fatigue Eyes: negative Ears, nose, mouth, throat, and face: negative Respiratory: positive for cough and dyspnea on exertion Cardiovascular: negative for orthopnea and palpitations Gastrointestinal: negative for constipation, diarrhea, nausea and vomiting Genitourinary:negative for dysuria, frequency and hematuria Integument/breast: negative for pruritus and rash Hematologic/lymphatic: negative for bleeding, easy bruising and lymphadenopathy Musculoskeletal:positive for arthralgias and myalgias Neurological: negative for headaches, paresthesia and seizures Behavioral/Psych: negative Endocrine: negative Allergic/Immunologic: negative   PHYSICAL EXAMINATION: General appearance: alert, cooperative, fatigued and no distress Head: Normocephalic, without obvious abnormality, atraumatic Neck: no adenopathy, no JVD, supple, symmetrical, trachea midline and thyroid not enlarged, symmetric, no tenderness/mass/nodules Lymph nodes: Cervical, supraclavicular, and axillary nodes normal. Resp: clear to auscultation bilaterally Back: symmetric, no curvature. ROM normal. No CVA tenderness. Cardio: regular rate and rhythm, S1, S2 normal, no murmur, click, rub or gallop GI: soft, non-tender; bowel sounds normal; no masses,  no organomegaly Extremities: extremities normal, atraumatic, no cyanosis or edema Neurologic: Alert and oriented X 3, normal strength and tone. Normal symmetric reflexes. Normal coordination and gait  ECOG PERFORMANCE STATUS: 1 - Symptomatic but completely ambulatory  Blood pressure 139/71, pulse 92, temperature 98.7 F (37.1 C), temperature source Oral, resp. rate 18, height '5\' 6"'$  (1.676 m), weight 209 lb 8 oz (95 kg), SpO2 98 %.  LABORATORY DATA: Lab Results  Component Value Date   WBC 8.4 11/28/2016   HGB 12.6 11/28/2016   HCT 38.1 11/28/2016   MCV 84.6 11/28/2016   PLT 634 (H) 11/28/2016      Chemistry      Component Value Date/Time   NA 139 11/21/2016 0427   NA 139  11/16/2016 0822   K 3.8 11/21/2016 0427   K 4.2 11/16/2016 0822   CL 106 11/21/2016 0427   CO2 24 11/21/2016 0427   CO2 25 11/16/2016 0822   BUN 14 11/21/2016 0427   BUN 17.6 11/16/2016 0822   CREATININE 0.90 11/21/2016 0427   CREATININE 0.8 11/16/2016 0822      Component Value Date/Time   CALCIUM 8.6 (L) 11/21/2016 0427   CALCIUM 9.4 11/16/2016 0822   ALKPHOS 100 11/19/2016 2021   ALKPHOS 112 11/16/2016 0822   AST 19 11/19/2016 2021   AST 16 11/16/2016 0822   ALT 18 11/19/2016 2021   ALT 16 11/16/2016 0822   BILITOT <0.1 (L) 11/19/2016 2021   BILITOT 0.22 11/16/2016 0822       RADIOGRAPHIC STUDIES: Dg Chest 2 View  Result Date: 11/19/2016 CLINICAL DATA:  Small cell lung cancer low grade temperature EXAM: CHEST  2 VIEW COMPARISON:  10/21/2016, 10/19/2016 FINDINGS: The left lung shows no acute consolidation or pleural effusion. Interval decrease in size of right hilar mass. There is 80 triangular-shaped opacity in the right middle lobe which may reflect atelectasis or infiltrate. This is increased compared with 10/19/2016. Minimal shift of contents to the right  suggests mild volume loss. No pneumothorax. IMPRESSION: 1. Right middle lobe wedge-shaped opacity could reflect atelectasis (favored, given mild shift of mediastinal contents to the right suggesting volume loss.) or possibly an infiltrate. 2. Interval decrease in size of the right hilar mass. Electronically Signed   By: Donavan Foil M.D.   On: 11/19/2016 20:39    ASSESSMENT AND PLAN: This is a very pleasant 65 years old white female with: 1) Limited stage small cell lung cancer currently undergoing systemic chemotherapy with cisplatin and etoposide status post 1 cycle. The patient related the first cycle of her treatment well except for hospitalization for neutropenic fever. She did not receive Neulasta injection after the first cycle of her treatment because of insurance denial. I had a lengthy discussion with the patient  today about her condition. I recommended for her to proceed with cycle #2 of her treatment but she will receive Neulasta injection (Onpro) after this cycle of treatment because of her previous hospitalization with the neutropenic fever. I will see her back for follow-up visit in 3 weeks for reevaluation with repeat CT scan of the chest for restaging of her disease. 2) recent history of neutropenic fever: She will receive Neulasta injection after this cycle. We will continue to monitor her lab work closely. 3) nausea and vomiting: Much better. The patient is over-the-counter cannabinoids oil with improvement of her symptoms. She also has Compazine at home. 4) COPD: The patient will continue her current treatment with albuterol and Spiriva. 5) hypertension: Well-controlled with the current treatment with Norvasc. 6) muscle spasm: I recommended for her to continue treatment with Flexeril. The patient was advised to call immediately if she has any concerning symptoms in the interval. The patient voices understanding of current disease status and treatment options and is in agreement with the current care plan.  All questions were answered. The patient knows to call the clinic with any problems, questions or concerns. We can certainly see the patient much sooner if necessary.  Disclaimer: This note was dictated with voice recognition software. Similar sounding words can inadvertently be transcribed and may not be corrected upon review.

## 2016-11-28 NOTE — Telephone Encounter (Signed)
Appointments scheduled per 11/28/16 los. A copy of the appointment schedule was given to the patient, per 11/28/16 los.

## 2016-11-28 NOTE — Patient Instructions (Signed)
[facility] Discharge Instructions for Patients Receiving Chemotherapy  Today you received the following chemotherapy agents :  Cisplatin,  Etoposide.  To help prevent nausea and vomiting after your treatment, we encourage you to take your nausea medication as prescribed.   If you develop nausea and vomiting that is not controlled by your nausea medication, call the clinic.   BELOW ARE SYMPTOMS THAT SHOULD BE REPORTED IMMEDIATELY:  *FEVER GREATER THAN 100.5 F  *CHILLS WITH OR WITHOUT FEVER  NAUSEA AND VOMITING THAT IS NOT CONTROLLED WITH YOUR NAUSEA MEDICATION  *UNUSUAL SHORTNESS OF BREATH  *UNUSUAL BRUISING OR BLEEDING  TENDERNESS IN MOUTH AND THROAT WITH OR WITHOUT PRESENCE OF ULCERS  *URINARY PROBLEMS  *BOWEL PROBLEMS  UNUSUAL RASH Items with * indicate a potential emergency and should be followed up as soon as possible.  Feel free to call the clinic you have any questions or concerns. The clinic phone number is [phone].  Please show the [place] at check-in to the Emergency Department and triage nurse.

## 2016-11-29 ENCOUNTER — Ambulatory Visit (HOSPITAL_BASED_OUTPATIENT_CLINIC_OR_DEPARTMENT_OTHER): Payer: BLUE CROSS/BLUE SHIELD

## 2016-11-29 VITALS — BP 121/56 | HR 95 | Temp 98.7°F | Resp 18

## 2016-11-29 DIAGNOSIS — C3491 Malignant neoplasm of unspecified part of right bronchus or lung: Secondary | ICD-10-CM

## 2016-11-29 DIAGNOSIS — C3411 Malignant neoplasm of upper lobe, right bronchus or lung: Secondary | ICD-10-CM

## 2016-11-29 DIAGNOSIS — Z5111 Encounter for antineoplastic chemotherapy: Secondary | ICD-10-CM

## 2016-11-29 MED ORDER — DEXAMETHASONE SODIUM PHOSPHATE 10 MG/ML IJ SOLN: 10.0000 mg | Freq: Once | INTRAMUSCULAR | Status: DC

## 2016-11-29 MED ORDER — SODIUM CHLORIDE 0.9 % IV SOLN
Freq: Once | INTRAVENOUS | Status: AC
  Administered 2016-11-29: 10:00:00 via INTRAVENOUS

## 2016-11-29 MED ORDER — DEXAMETHASONE SODIUM PHOSPHATE 10 MG/ML IJ SOLN
INTRAMUSCULAR | Status: AC
  Filled 2016-11-29: qty 1

## 2016-11-29 MED ORDER — SODIUM CHLORIDE 0.9 % IV SOLN
120.0000 mg/m2 | Freq: Once | INTRAVENOUS | Status: AC
  Administered 2016-11-29: 250 mg via INTRAVENOUS
  Filled 2016-11-29: qty 12.5

## 2016-11-29 MED ORDER — SODIUM CHLORIDE 0.9 % IV SOLN
10.0000 mg | Freq: Once | INTRAVENOUS | Status: DC
  Administered 2016-11-29: 10 mg via INTRAVENOUS
  Filled 2016-11-29: qty 1

## 2016-11-29 NOTE — Patient Instructions (Signed)
[facility] Discharge Instructions for Patients Receiving Chemotherapy  Today you received the following chemotherapy agents: Etoposide   To help prevent nausea and vomiting after your treatment, we encourage you to take your nausea medication as directed.    If you develop nausea and vomiting that is not controlled by your nausea medication, call the clinic.   BELOW ARE SYMPTOMS THAT SHOULD BE REPORTED IMMEDIATELY:  *FEVER GREATER THAN 100.5 F  *CHILLS WITH OR WITHOUT FEVER  NAUSEA AND VOMITING THAT IS NOT CONTROLLED WITH YOUR NAUSEA MEDICATION  *UNUSUAL SHORTNESS OF BREATH  *UNUSUAL BRUISING OR BLEEDING  TENDERNESS IN MOUTH AND THROAT WITH OR WITHOUT PRESENCE OF ULCERS  *URINARY PROBLEMS  *BOWEL PROBLEMS  UNUSUAL RASH Items with * indicate a potential emergency and should be followed up as soon as possible.  Feel free to call the clinic you have any questions or concerns. The clinic phone number is [phone].  Please show the CHEMO ALERT CARD at check-in to the Emergency Department and triage nurse.   

## 2016-11-30 ENCOUNTER — Ambulatory Visit (HOSPITAL_BASED_OUTPATIENT_CLINIC_OR_DEPARTMENT_OTHER): Payer: BLUE CROSS/BLUE SHIELD

## 2016-11-30 VITALS — BP 146/85 | HR 85 | Temp 97.9°F | Resp 17

## 2016-11-30 DIAGNOSIS — C3491 Malignant neoplasm of unspecified part of right bronchus or lung: Secondary | ICD-10-CM

## 2016-11-30 DIAGNOSIS — Z5111 Encounter for antineoplastic chemotherapy: Secondary | ICD-10-CM | POA: Diagnosis not present

## 2016-11-30 DIAGNOSIS — Z5189 Encounter for other specified aftercare: Secondary | ICD-10-CM

## 2016-11-30 DIAGNOSIS — C3411 Malignant neoplasm of upper lobe, right bronchus or lung: Secondary | ICD-10-CM

## 2016-11-30 MED ORDER — SODIUM CHLORIDE 0.9 % IV SOLN: 120.0000 mg/m2 | Freq: Once | INTRAVENOUS | Status: DC

## 2016-11-30 MED ORDER — PEGFILGRASTIM 6 MG/0.6ML ~~LOC~~ PSKT
6.0000 mg | PREFILLED_SYRINGE | Freq: Once | SUBCUTANEOUS | Status: AC
  Administered 2016-11-30: 6 mg via SUBCUTANEOUS
  Filled 2016-11-30: qty 0.6

## 2016-11-30 MED ORDER — DEXAMETHASONE SODIUM PHOSPHATE 10 MG/ML IJ SOLN
INTRAMUSCULAR | Status: AC
  Filled 2016-11-30: qty 1

## 2016-11-30 MED ORDER — DEXAMETHASONE SODIUM PHOSPHATE 10 MG/ML IJ SOLN
10.0000 mg | Freq: Once | INTRAMUSCULAR | Status: AC
  Administered 2016-11-30: 10 mg via INTRAVENOUS

## 2016-11-30 MED ORDER — SODIUM CHLORIDE 0.9 % IV SOLN
120.0000 mg/m2 | Freq: Once | INTRAVENOUS | Status: AC
  Administered 2016-11-30: 250 mg via INTRAVENOUS
  Filled 2016-11-30: qty 12.5

## 2016-11-30 MED ORDER — SODIUM CHLORIDE 0.9 % IV SOLN
Freq: Once | INTRAVENOUS | Status: AC
  Administered 2016-11-30: 10:00:00 via INTRAVENOUS

## 2016-11-30 NOTE — Progress Notes (Signed)
Neulasta On pro education preformed and printed instructions given to pt. Pt verbalizes understanding. Pt stable at discharge.

## 2016-11-30 NOTE — Patient Instructions (Signed)
[facility] Discharge Instructions for Patients Receiving Chemotherapy  Today you received the following chemotherapy agents: Etoposide   To help prevent nausea and vomiting after your treatment, we encourage you to take your nausea medication as directed.    If you develop nausea and vomiting that is not controlled by your nausea medication, call the clinic.   BELOW ARE SYMPTOMS THAT SHOULD BE REPORTED IMMEDIATELY:  *FEVER GREATER THAN 100.5 F  *CHILLS WITH OR WITHOUT FEVER  NAUSEA AND VOMITING THAT IS NOT CONTROLLED WITH YOUR NAUSEA MEDICATION  *UNUSUAL SHORTNESS OF BREATH  *UNUSUAL BRUISING OR BLEEDING  TENDERNESS IN MOUTH AND THROAT WITH OR WITHOUT PRESENCE OF ULCERS  *URINARY PROBLEMS  *BOWEL PROBLEMS  UNUSUAL RASH Items with * indicate a potential emergency and should be followed up as soon as possible.  Feel free to call the clinic you have any questions or concerns. The clinic phone number is [phone].  Please show the CHEMO ALERT CARD at check-in to the Emergency Department and triage nurse.   

## 2016-12-01 NOTE — Progress Notes (Signed)
  Radiation Oncology         [phone] ________________________________  Name: Adama Ivins MRN: [id number]  Date: 12/02/2016  DOB: 12-Nov-1951  SIMULATION AND TREATMENT PLANNING NOTE    ICD-9-CM ICD-10-CM   1. Small cell lung carcinoma, right (HCC) 162.9 C34.91     DIAGNOSIS:  65 y.o. female with limited stage small cell carcinoma of the right upper lobe invading the right hilum and mediastinum  NARRATIVE:  The patient was brought to the [facility].  Identity was confirmed.  All relevant records and images related to the planned course of therapy were reviewed.  The patient freely provided informed written consent to proceed with treatment after reviewing the details related to the planned course of therapy. The consent form was witnessed and verified by the simulation staff.  Then, the patient was set-up in a stable reproducible  supine position for radiation therapy.  CT images were obtained.  Surface markings were placed.  The CT images were loaded into the planning software.  Then the target and avoidance structures were contoured.  Treatment planning then occurred.  The radiation prescription was entered and confirmed.  Then, I designed and supervised the construction of a total of 6 medically necessary complex treatment devices, including a BodyFix immobilization mold custom fitted to the patient along with 5 multileaf collimators conformally shaped radiation around the treatment target while shielding critical structures such as the heart and spinal cord maximally.  I have requested : 3D Simulation  I have requested a DVH of the following structures: Left lung, right lung, spinal cord, heart, esophagus, and target.  I have ordered:Nutrition Consult  SPECIAL TREATMENT PROCEDURE:  The planned course of therapy using radiation constitutes a special treatment procedure. Special care is required in the management of this patient for the following reasons.  The patient will be  receiving concurrent chemotherapy requiring careful monitoring for increased toxicities of treatment including periodic laboratory values.  The special nature of the planned course of radiotherapy will require increased physician supervision and oversight to ensure patient's safety with optimal treatment outcomes.  PLAN:  The patient will receive 59.4 Gy in 33 fractions.  ________________________________  Sheral Apley Tammi Klippel, M.D.

## 2016-12-02 ENCOUNTER — Encounter: Payer: Self-pay | Admitting: Radiation Oncology

## 2016-12-02 ENCOUNTER — Ambulatory Visit
Admission: RE | Admit: 2016-12-02 | Discharge: 2016-12-02 | Disposition: A | Discharge status: Home / Self Care | Payer: BLUE CROSS/BLUE SHIELD | Source: Ambulatory Visit | Attending: Radiation Oncology | Admitting: Radiation Oncology

## 2016-12-02 ENCOUNTER — Ambulatory Visit: Payer: BLUE CROSS/BLUE SHIELD | Attending: Radiation Oncology

## 2016-12-02 VITALS — BP 105/75 | HR 106 | Temp 98.2°F | Resp 20 | Wt 203.8 lb

## 2016-12-02 DIAGNOSIS — Z510 Encounter for antineoplastic radiation therapy: Secondary | ICD-10-CM | POA: Insufficient documentation

## 2016-12-02 DIAGNOSIS — C3411 Malignant neoplasm of upper lobe, right bronchus or lung: Secondary | ICD-10-CM | POA: Insufficient documentation

## 2016-12-02 DIAGNOSIS — C3491 Malignant neoplasm of unspecified part of right bronchus or lung: Secondary | ICD-10-CM

## 2016-12-02 NOTE — Progress Notes (Signed)
PAIN: She rates her pain as a 5 on a scale of 0-10. constant, sharp and burning over joint pain, bilateral shoulders. RESPIRATORY: Wheezing and Coughing  Dry. Pt is on room air.  SWALLOWING/DIET: Pt denies dysphagia  OTHER: Pt complains of fatigue, weakness and loss of sleep.  BP 105/75   Pulse (!) 106   Temp 98.2 F (36.8 C) (Oral)   Resp 20   Wt 203 lb 12.8 oz (92.4 kg)   SpO2 97%   BMI 32.89 kg/m  Wt Readings from Last 3 Encounters:  12/02/16 203 lb 12.8 oz (92.4 kg)  11/28/16 209 lb 8 oz (95 kg)  11/20/16 209 lb 10.5 oz (95.1 kg)

## 2016-12-05 ENCOUNTER — Other Ambulatory Visit: Payer: BLUE CROSS/BLUE SHIELD

## 2016-12-13 ENCOUNTER — Ambulatory Visit
Admission: RE | Admit: 2016-12-13 | Discharge: 2016-12-13 | Disposition: A | Discharge status: Home / Self Care | Payer: BLUE CROSS/BLUE SHIELD | Source: Ambulatory Visit | Attending: Radiation Oncology | Admitting: Radiation Oncology

## 2016-12-13 ENCOUNTER — Other Ambulatory Visit (HOSPITAL_BASED_OUTPATIENT_CLINIC_OR_DEPARTMENT_OTHER): Payer: BLUE CROSS/BLUE SHIELD

## 2016-12-13 DIAGNOSIS — C3411 Malignant neoplasm of upper lobe, right bronchus or lung: Secondary | ICD-10-CM

## 2016-12-13 DIAGNOSIS — C3491 Malignant neoplasm of unspecified part of right bronchus or lung: Secondary | ICD-10-CM

## 2016-12-13 LAB — CBC WITH DIFFERENTIAL/PLATELET
BASO%: 0.7 % (ref 0.0–2.0)
Basophils Absolute: 0.1 10*3/uL (ref 0.0–0.1)
EOS%: 0.3 % (ref 0.0–7.0)
Eosinophils Absolute: 0.1 10*3/uL (ref 0.0–0.5)
HCT: 36.2 % (ref 34.8–46.6)
HGB: 12.1 g/dL (ref 11.6–15.9)
LYMPH%: 11.1 % — ABNORMAL LOW (ref 14.0–49.7)
MCH: 28.4 pg (ref 25.1–34.0)
MCHC: 33.4 g/dL (ref 31.5–36.0)
MCV: 85 fL (ref 79.5–101.0)
MONO#: 0.6 10*3/uL (ref 0.1–0.9)
MONO%: 3.1 % (ref 0.0–14.0)
NEUT#: 17.7 10*3/uL — ABNORMAL HIGH (ref 1.5–6.5)
NEUT%: 84.8 % — ABNORMAL HIGH (ref 38.4–76.8)
Platelets: 149 10*3/uL (ref 145–400)
RBC: 4.26 10*6/uL (ref 3.70–5.45)
RDW: 15 % — ABNORMAL HIGH (ref 11.2–14.5)
WBC: 20.9 10*3/uL — ABNORMAL HIGH (ref 3.9–10.3)
lymph#: 2.3 10*3/uL (ref 0.9–3.3)

## 2016-12-13 LAB — COMPREHENSIVE METABOLIC PANEL
ALT: 13 U/L (ref 0–55)
AST: 20 U/L (ref 5–34)
Albumin: 3.5 g/dL (ref 3.5–5.0)
Alkaline Phosphatase: 142 U/L (ref 40–150)
Anion Gap: 11 mEq/L (ref 3–11)
BUN: 18.2 mg/dL (ref 7.0–26.0)
CO2: 24 mEq/L (ref 22–29)
Calcium: 9.3 mg/dL (ref 8.4–10.4)
Chloride: 106 mEq/L (ref 98–109)
Creatinine: 0.9 mg/dL (ref 0.6–1.1)
EGFR: 68 mL/min/{1.73_m2} — ABNORMAL LOW (ref >90)
Glucose: 129 mg/dl (ref 70–140)
Potassium: 3.9 mEq/L (ref 3.5–5.1)
Sodium: 141 mEq/L (ref 136–145)
Total Bilirubin: 0.23 mg/dL (ref 0.20–1.20)
Total Protein: 6.5 g/dL (ref 6.4–8.3)

## 2016-12-13 LAB — MAGNESIUM: Magnesium: 1.8 mg/dl (ref 1.5–2.5)

## 2016-12-14 ENCOUNTER — Ambulatory Visit
Admission: RE | Admit: 2016-12-14 | Discharge: 2016-12-14 | Disposition: A | Discharge status: Home / Self Care | Payer: BLUE CROSS/BLUE SHIELD | Source: Ambulatory Visit | Attending: Radiation Oncology | Admitting: Radiation Oncology

## 2016-12-15 ENCOUNTER — Ambulatory Visit
Admission: RE | Admit: 2016-12-15 | Discharge: 2016-12-15 | Disposition: A | Discharge status: Home / Self Care | Payer: BLUE CROSS/BLUE SHIELD | Source: Ambulatory Visit | Attending: Radiation Oncology | Admitting: Radiation Oncology

## 2016-12-16 ENCOUNTER — Ambulatory Visit (HOSPITAL_COMMUNITY)
Admission: RE | Admit: 2016-12-16 | Discharge: 2016-12-16 | Disposition: A | Discharge status: Home / Self Care | Payer: BLUE CROSS/BLUE SHIELD | Source: Ambulatory Visit | Attending: Internal Medicine | Admitting: Internal Medicine

## 2016-12-16 ENCOUNTER — Ambulatory Visit
Admission: RE | Admit: 2016-12-16 | Discharge: 2016-12-16 | Disposition: A | Discharge status: Home / Self Care | Payer: BLUE CROSS/BLUE SHIELD | Source: Ambulatory Visit | Attending: Radiation Oncology | Admitting: Radiation Oncology

## 2016-12-16 ENCOUNTER — Encounter (HOSPITAL_COMMUNITY): Payer: Self-pay

## 2016-12-16 VITALS — BP 114/82 | HR 112 | Temp 98.3°F | Resp 18 | Wt 203.0 lb

## 2016-12-16 DIAGNOSIS — C3491 Malignant neoplasm of unspecified part of right bronchus or lung: Secondary | ICD-10-CM | POA: Insufficient documentation

## 2016-12-16 DIAGNOSIS — R42 Dizziness and giddiness: Secondary | ICD-10-CM | POA: Diagnosis not present

## 2016-12-16 DIAGNOSIS — J029 Acute pharyngitis, unspecified: Secondary | ICD-10-CM | POA: Insufficient documentation

## 2016-12-16 DIAGNOSIS — H538 Other visual disturbances: Secondary | ICD-10-CM | POA: Insufficient documentation

## 2016-12-16 DIAGNOSIS — R110 Nausea: Secondary | ICD-10-CM | POA: Insufficient documentation

## 2016-12-16 DIAGNOSIS — R51 Headache: Secondary | ICD-10-CM | POA: Insufficient documentation

## 2016-12-16 DIAGNOSIS — R918 Other nonspecific abnormal finding of lung field: Secondary | ICD-10-CM | POA: Diagnosis not present

## 2016-12-16 DIAGNOSIS — T451X5A Adverse effect of antineoplastic and immunosuppressive drugs, initial encounter: Secondary | ICD-10-CM | POA: Insufficient documentation

## 2016-12-16 DIAGNOSIS — R590 Localized enlarged lymph nodes: Secondary | ICD-10-CM | POA: Diagnosis not present

## 2016-12-16 DIAGNOSIS — R05 Cough: Secondary | ICD-10-CM | POA: Diagnosis not present

## 2016-12-16 DIAGNOSIS — D61810 Antineoplastic chemotherapy induced pancytopenia: Secondary | ICD-10-CM | POA: Insufficient documentation

## 2016-12-16 DIAGNOSIS — J449 Chronic obstructive pulmonary disease, unspecified: Secondary | ICD-10-CM | POA: Diagnosis not present

## 2016-12-16 DIAGNOSIS — H9319 Tinnitus, unspecified ear: Secondary | ICD-10-CM | POA: Insufficient documentation

## 2016-12-16 DIAGNOSIS — R0602 Shortness of breath: Secondary | ICD-10-CM | POA: Insufficient documentation

## 2016-12-16 DIAGNOSIS — R0781 Pleurodynia: Secondary | ICD-10-CM | POA: Diagnosis not present

## 2016-12-16 DIAGNOSIS — Z5111 Encounter for antineoplastic chemotherapy: Secondary | ICD-10-CM | POA: Diagnosis not present

## 2016-12-16 MED ORDER — IOPAMIDOL (ISOVUE-300) INJECTION 61%
INTRAVENOUS | Status: AC
  Administered 2016-12-16: 75 mL
  Filled 2016-12-16: qty 75

## 2016-12-16 MED ORDER — RADIAPLEXRX EX GEL
Freq: Once | CUTANEOUS | Status: AC
  Administered 2016-12-16: 16:00:00 via TOPICAL

## 2016-12-16 NOTE — Progress Notes (Signed)
  Radiation Oncology         [phone]   Name: Tammy Bowers MRN: [id number]   Date: 12/16/2016  DOB: [birth year]   Weekly Radiation Therapy Management    ICD-9-CM ICD-10-CM   1. Small cell lung carcinoma, right (HCC) 162.9 C34.91 hyaluronate sodium (RADIAPLEXRX) gel    Current Dose: 7.2 Gy  Planned Dose:  59.4 Gy  Narrative The patient presents for routine under treatment assessment.  Weight and vitals stable. The patient reports intermittent "digging" pain in bilateral ribs. She reports a dry cough only at night. She reports a sore throat that developed yesterday. She reports headache, nausea, dizziness, tinnitus, and blurry vision, all of which she has experienced "for a while." Her SOB is unchanged. She reports moderate fatigue related to effects of chemotherapy.  The patient is without complaint. Set-up films were reviewed. The chart was checked.  Physical Findings  weight is 203 lb (92.1 kg). Her oral temperature is 98.3 F (36.8 C). Her blood pressure is 114/82 and her pulse is 112 (abnormal). Her respiration is 18 and oxygen saturation is 97%.  Weight essentially stable.  No significant changes.  Impression The patient is tolerating radiation.  Plan Continue treatment as planned.       ------------------------------------------------  Jodelle Gross, MD, PhD  This document serves as a record of services personally performed by Kyung Rudd, MD. It was created on his behalf by Maryla Morrow, a trained medical scribe. The creation of this record is based on the scribe's personal observations and the provider's statements to them. This document has been checked and approved by the attending provider.

## 2016-12-16 NOTE — Progress Notes (Signed)
Weight and vitals stable. Reports intermittent "digging" pain in bilateral ribs. Reports a dry cough only at night. Reports a sore throat that developed yesterday. Reports headache, nausea, dizziness, tinnitus, and blurry vision. No recent MRI of brain noted. Reports shortness of breath is unchanged. Reports moderate fatigue related to effects of chemotherapy. Denies skin changes within treatment field.  BP 114/82 (BP Location: Right Arm, Patient Position: Sitting, Cuff Size: Normal)   Pulse (!) 112   Temp 98.3 F (36.8 C) (Oral)   Resp 18   Wt 203 lb (92.1 kg)   SpO2 97%   BMI 32.77 kg/m ' Wt Readings from Last 3 Encounters:  12/16/16 203 lb (92.1 kg)  12/02/16 203 lb 12.8 oz (92.4 kg)  11/28/16 209 lb 8 oz (95 kg)

## 2016-12-19 DIAGNOSIS — Z510 Encounter for antineoplastic radiation therapy: Secondary | ICD-10-CM | POA: Diagnosis present

## 2016-12-19 DIAGNOSIS — C3411 Malignant neoplasm of upper lobe, right bronchus or lung: Secondary | ICD-10-CM | POA: Diagnosis not present

## 2016-12-20 ENCOUNTER — Encounter: Payer: Self-pay | Admitting: Internal Medicine

## 2016-12-20 ENCOUNTER — Ambulatory Visit
Admission: RE | Admit: 2016-12-20 | Discharge: 2016-12-20 | Disposition: A | Discharge status: Home / Self Care | Payer: BLUE CROSS/BLUE SHIELD | Source: Ambulatory Visit | Attending: Radiation Oncology | Admitting: Radiation Oncology

## 2016-12-20 ENCOUNTER — Other Ambulatory Visit (HOSPITAL_BASED_OUTPATIENT_CLINIC_OR_DEPARTMENT_OTHER): Payer: BLUE CROSS/BLUE SHIELD

## 2016-12-20 ENCOUNTER — Ambulatory Visit (HOSPITAL_BASED_OUTPATIENT_CLINIC_OR_DEPARTMENT_OTHER): Payer: BLUE CROSS/BLUE SHIELD

## 2016-12-20 ENCOUNTER — Ambulatory Visit (HOSPITAL_BASED_OUTPATIENT_CLINIC_OR_DEPARTMENT_OTHER): Payer: BLUE CROSS/BLUE SHIELD | Admitting: Internal Medicine

## 2016-12-20 VITALS — BP 122/76 | HR 106 | Temp 98.0°F | Resp 18 | Wt 203.5 lb

## 2016-12-20 DIAGNOSIS — Z510 Encounter for antineoplastic radiation therapy: Secondary | ICD-10-CM | POA: Diagnosis not present

## 2016-12-20 DIAGNOSIS — C3491 Malignant neoplasm of unspecified part of right bronchus or lung: Secondary | ICD-10-CM

## 2016-12-20 DIAGNOSIS — J449 Chronic obstructive pulmonary disease, unspecified: Secondary | ICD-10-CM | POA: Diagnosis not present

## 2016-12-20 DIAGNOSIS — Z5111 Encounter for antineoplastic chemotherapy: Secondary | ICD-10-CM | POA: Diagnosis not present

## 2016-12-20 DIAGNOSIS — C3411 Malignant neoplasm of upper lobe, right bronchus or lung: Secondary | ICD-10-CM

## 2016-12-20 LAB — COMPREHENSIVE METABOLIC PANEL
ALT: 14 U/L (ref 0–55)
AST: 19 U/L (ref 5–34)
Albumin: 3.8 g/dL (ref 3.5–5.0)
Alkaline Phosphatase: 115 U/L (ref 40–150)
Anion Gap: 10 mEq/L (ref 3–11)
BUN: 19 mg/dL (ref 7.0–26.0)
CO2: 24 mEq/L (ref 22–29)
Calcium: 9.6 mg/dL (ref 8.4–10.4)
Chloride: 104 mEq/L (ref 98–109)
Creatinine: 0.8 mg/dL (ref 0.6–1.1)
EGFR: 74 mL/min/{1.73_m2} — ABNORMAL LOW (ref >90)
Glucose: 72 mg/dl (ref 70–140)
Potassium: 4.2 mEq/L (ref 3.5–5.1)
Sodium: 138 mEq/L (ref 136–145)
Total Bilirubin: 0.26 mg/dL (ref 0.20–1.20)
Total Protein: 7.1 g/dL (ref 6.4–8.3)

## 2016-12-20 LAB — CBC WITH DIFFERENTIAL/PLATELET
BASO%: 1 % (ref 0.0–2.0)
Basophils Absolute: 0.1 10*3/uL (ref 0.0–0.1)
EOS%: 1.3 % (ref 0.0–7.0)
Eosinophils Absolute: 0.1 10*3/uL (ref 0.0–0.5)
HCT: 37.8 % (ref 34.8–46.6)
HGB: 12.6 g/dL (ref 11.6–15.9)
LYMPH%: 17.3 % (ref 14.0–49.7)
MCH: 28.4 pg (ref 25.1–34.0)
MCHC: 33.3 g/dL (ref 31.5–36.0)
MCV: 85.3 fL (ref 79.5–101.0)
MONO#: 0.9 10*3/uL (ref 0.1–0.9)
MONO%: 11.1 % (ref 0.0–14.0)
NEUT#: 5.8 10*3/uL (ref 1.5–6.5)
NEUT%: 69.3 % (ref 38.4–76.8)
Platelets: 311 10*3/uL (ref 145–400)
RBC: 4.43 10*6/uL (ref 3.70–5.45)
RDW: 15 % — ABNORMAL HIGH (ref 11.2–14.5)
WBC: 8.3 10*3/uL (ref 3.9–10.3)
lymph#: 1.4 10*3/uL (ref 0.9–3.3)

## 2016-12-20 LAB — MAGNESIUM: Magnesium: 2 mg/dl (ref 1.5–2.5)

## 2016-12-20 MED ORDER — SODIUM CHLORIDE 0.9 % IV SOLN
120.0000 mg/m2 | Freq: Once | INTRAVENOUS | Status: AC
  Administered 2016-12-20: 250 mg via INTRAVENOUS
  Filled 2016-12-20: qty 12.5

## 2016-12-20 MED ORDER — HEPARIN SOD (PORK) LOCK FLUSH 100 UNIT/ML IV SOLN
500.0000 [IU] | Freq: Once | INTRAVENOUS | Status: DC | PRN
  Filled 2016-12-20: qty 5

## 2016-12-20 MED ORDER — SODIUM CHLORIDE 0.9 % IV SOLN
Freq: Once | INTRAVENOUS | Status: AC
  Administered 2016-12-20: 11:00:00 via INTRAVENOUS

## 2016-12-20 MED ORDER — PALONOSETRON HCL INJECTION 0.25 MG/5ML
0.2500 mg | Freq: Once | INTRAVENOUS | Status: AC
  Administered 2016-12-20: 0.25 mg via INTRAVENOUS

## 2016-12-20 MED ORDER — SODIUM CHLORIDE 0.9 % IV SOLN
60.0000 mg/m2 | Freq: Once | INTRAVENOUS | Status: AC
  Administered 2016-12-20: 127 mg via INTRAVENOUS
  Filled 2016-12-20: qty 127

## 2016-12-20 MED ORDER — PALONOSETRON HCL INJECTION 0.25 MG/5ML
INTRAVENOUS | Status: AC
  Filled 2016-12-20: qty 5

## 2016-12-20 MED ORDER — SODIUM CHLORIDE 0.9% FLUSH
10.0000 mL | INTRAVENOUS | Status: DC | PRN
  Filled 2016-12-20: qty 10

## 2016-12-20 MED ORDER — DEXAMETHASONE SODIUM PHOSPHATE 10 MG/ML IJ SOLN: 10.0000 mg | Freq: Once | INTRAMUSCULAR | Status: DC

## 2016-12-20 MED ORDER — FOSAPREPITANT DIMEGLUMINE INJECTION 150 MG
Freq: Once | INTRAVENOUS | Status: AC
  Administered 2016-12-20: 13:00:00 via INTRAVENOUS
  Filled 2016-12-20: qty 5

## 2016-12-20 MED ORDER — POTASSIUM CHLORIDE 2 MEQ/ML IV SOLN
Freq: Once | INTRAVENOUS | Status: AC
  Administered 2016-12-20: 11:00:00 via INTRAVENOUS
  Filled 2016-12-20: qty 10

## 2016-12-20 NOTE — Progress Notes (Signed)
[facility] [phone]   [phone]  OFFICE PROGRESS NOTE  Lujean Amel, MD [address] [place] [address]  DIAGNOSIS: Limited stage (T2b, N2, M0) small cell lung cancer diagnosed in November 2017 and presented with large right hilar mass with mediastinal invasion and a right lower lobe pulmonary nodule.  PRIOR THERAPY: None  CURRENT THERAPY: Systemic chemotherapy with cisplatin 60 MG/M2 on day 1 and etoposide 120 MG/M2 on days 1, 2 and 3 concurrent with radiation is status post 2 cycles.  INTERVAL HISTORY: Tammy Bowers 66 y.o. female returns to the clinic today for follow-up visit accompanied by her husband. The patient tolerated the second cycle of her chemotherapy much better. She has some fatigue and aching pain after the Neulasta injection but this improved with Claritin. She continues to have dry cough and wheezing. She denied having any chest pain and has mild shortness of breath with exertion with no hemoptysis. She denied having any weight loss or night sweats. She has no fever or chills. She started concurrent radiotherapy the week ago. She had repeat CT scan of the chest performed recently and she is here for evaluation and discussion of her scan results.  MEDICAL HISTORY: Past Medical History:  Diagnosis Date  . Adenopathy 10/10/2016   PERICARINAL  . Anemia   . Arthritis   . Asthma   . Cancer ([place])   . COPD (chronic obstructive pulmonary disease) ([facility])   . Dyspnea   . Headache    migraines  . Hilar mass 10/10/2016   RIGHT  . History of kidney stones   . Insomnia   . Lung nodules 10/10/2016   RIGHT LOWER LOBE  . Mass of both adrenal glands ([place]) 10/10/2016  . Mood swing ([place])   . Pneumonia   . Substance abuse    TOBACCO  . UNSPECIFIED INFECTION OF BONE ANKLE AND FOOT 10/22/2009   Annotation: left ankle Qualifier: Diagnosis of  By: Patsy Baltimore RN, Denise      ALLERGIES:  is allergic to  lidocaine.  MEDICATIONS:  Current Outpatient Prescriptions  Medication Sig Dispense Refill  . albuterol (PROVENTIL HFA;VENTOLIN HFA) 108 (90 BASE) MCG/ACT inhaler Inhale 2 puffs into the lungs every 4 (four) hours as needed for wheezing.    . Cyclobenzaprine HCl (FLEXERIL PO) Take 5 mg by mouth 3 (three) times daily as needed.    . Fluticasone-Salmeterol (ADVAIR) 500-50 MCG/DOSE AEPB Inhale 1 puff into the lungs 2 (two) times daily.    Marland Kitchen HYDROcodone-acetaminophen (NORCO/VICODIN) 5-325 MG tablet Take 1 tablet by mouth 2 (two) times daily as needed.    Marland Kitchen LORazepam (ATIVAN) 0.5 MG tablet Take 1 tablet (0.5 mg total) by mouth at bedtime. 20 tablet 0  . Misc Natural Products (GLUCOSAMINE CHOND COMPLEX/MSM PO) Take 1 tablet by mouth every other day.    . Multiple Vitamin (MULTIVITAMIN WITH MINERALS) TABS tablet Take 1 tablet by mouth every other day. Women's Multivitamin     . Omega-3 Fatty Acids (FISH OIL) 1000 MG CAPS Take 1,000 mg by mouth every other day.    Marland Kitchen OVER THE COUNTER MEDICATION Take 1 mL by mouth 2 (two) times daily. CBD Extract    . tiotropium (SPIRIVA) 18 MCG inhalation capsule Place 18 mcg into inhaler and inhale daily.    . varenicline (CHANTIX CONTINUING MONTH PAK) 1 MG tablet Take 1 tablet by mouth 2 (two) times daily.    Marland Kitchen acetaminophen (TYLENOL) 500 MG tablet Take 2  tablets by mouth every 6 (six) hours as needed.    Marland Kitchen amitriptyline (ELAVIL) 10 MG tablet Take 10 mg by mouth daily.  0  . ibuprofen (ADVIL,MOTRIN) 800 MG tablet Take 800 mg by mouth 3 (three) times daily as needed for mild pain.   0  . prochlorperazine (COMPAZINE) 10 MG tablet Take 1 tablet (10 mg total) by mouth every 6 (six) hours as needed for nausea or vomiting. (Patient not taking: Reported on 12/20/2016) 30 tablet 0   No current facility-administered medications for this visit.     SURGICAL HISTORY:  Past Surgical History:  Procedure Laterality Date  . ANKLE SURGERY    . APPENDECTOMY    . LUNG BIOPSY N/A  10/19/2016   Procedure: LUNG BIOPSY;  Surgeon: Grace Isaac, MD;  Location: [facility];  Service: Thoracic;  Laterality: N/A;  . TONSILLECTOMY    . VIDEO BRONCHOSCOPY WITH ENDOBRONCHIAL ULTRASOUND N/A 10/19/2016   Procedure: VIDEO BRONCHOSCOPY WITH ENDOBRONCHIAL ULTRASOUND;  Surgeon: Grace Isaac, MD;  Location: [facility];  Service: Thoracic;  Laterality: N/A;    REVIEW OF SYSTEMS:  Constitutional: positive for fatigue Eyes: negative Ears, nose, mouth, throat, and face: negative Respiratory: positive for cough and dyspnea on exertion Cardiovascular: negative Gastrointestinal: negative Genitourinary:negative Integument/breast: negative Hematologic/lymphatic: negative Musculoskeletal:negative Neurological: negative Behavioral/Psych: negative Endocrine: negative Allergic/Immunologic: negative   PHYSICAL EXAMINATION: General appearance: alert, cooperative and no distress Head: Normocephalic, without obvious abnormality, atraumatic Neck: no adenopathy, no JVD, supple, symmetrical, trachea midline and thyroid not enlarged, symmetric, no tenderness/mass/nodules Lymph nodes: Cervical, supraclavicular, and axillary nodes normal. Resp: wheezes bilaterally Back: symmetric, no curvature. ROM normal. No CVA tenderness. Cardio: regular rate and rhythm, S1, S2 normal, no murmur, click, rub or gallop GI: soft, non-tender; bowel sounds normal; no masses,  no organomegaly Extremities: extremities normal, atraumatic, no cyanosis or edema Neurologic: Alert and oriented X 3, normal strength and tone. Normal symmetric reflexes. Normal coordination and gait  ECOG PERFORMANCE STATUS: 1 - Symptomatic but completely ambulatory  Blood pressure 122/76, pulse (!) 106, temperature 98 F (36.7 C), temperature source Oral, resp. rate 18, weight 203 lb 8 oz (92.3 kg), SpO2 98 %.  LABORATORY DATA: Lab Results  Component Value Date   WBC 8.3 12/20/2016   HGB 12.6 12/20/2016   HCT 37.8 12/20/2016   MCV 85.3  12/20/2016   PLT 311 12/20/2016      Chemistry      Component Value Date/Time   NA 141 12/13/2016 0938   K 3.9 12/13/2016 0938   CL 106 11/21/2016 0427   CO2 24 12/13/2016 0938   BUN 18.2 12/13/2016 0938   CREATININE 0.9 12/13/2016 0938      Component Value Date/Time   CALCIUM 9.3 12/13/2016 0938   ALKPHOS 142 12/13/2016 0938   AST 20 12/13/2016 0938   ALT 13 12/13/2016 0938   BILITOT 0.23 12/13/2016 0938       RADIOGRAPHIC STUDIES: Ct Chest W Contrast  Result Date: 12/16/2016 CLINICAL DATA:  Patient with history of lung cancer. Follow-up evaluation. EXAM: CT CHEST WITH CONTRAST TECHNIQUE: Multidetector CT imaging of the chest was performed during intravenous contrast administration. CONTRAST:  75cc ISOVUE-300 IOPAMIDOL (ISOVUE-300) INJECTION 61% COMPARISON:  PET-CT 10/21/2016. FINDINGS: Cardiovascular: Normal heart size. No pericardial effusion. Aorta and main pulmonary artery normal in caliber. Mediastinum/Nodes: Interval decrease in size of precarinal lymph node measuring 1.2 cm (image 56; series 2), previously 2.5 cm. Additional calcified mediastinal lymph nodes are grossly unchanged including a 10 mm subcarinal lymph  node (image 63; series 2). Normal esophagus. Lungs/Pleura: Significant interval decrease in size of right hilar mass measuring 2.5 x 1.7 cm (image 72; series 2), previously 5.7 x 4.8 cm. Significant interval decrease in previously described patchy consolidation within the superior segment right lower lobe. Minimal residual ground-glass and consolidative opacities are demonstrated (image 71; series 5). Persistent consolidative opacities within the right middle lobe. No pleural effusion or pneumothorax. Upper Abdomen: Re- demonstrated 3 cm lesion within the right adrenal gland, characterized as adenoma on prior exam. Musculoskeletal: No aggressive or acute appearing osseous lesions. Thoracic spine degenerative changes. IMPRESSION: Significant interval decrease in size of  previously described mass within the right hilum, most compatible with treatment response. Significant interval decrease and near complete resolution of previously described adjacent consolidation within the superior segment right lower lobe, most compatible with resolving postobstructive infectious process. Persistent consolidation within the right middle lobe. Interval decrease in size of previously described mediastinal adenopathy. Electronically Signed   By: Lovey Newcomer M.D.   On: 12/16/2016 13:51    ASSESSMENT AND PLAN: This is a very pleasant 66 years old white female with limited stage small cell lung cancer and currently undergoing systemic chemotherapy with cisplatin and etoposide status post 2 cycles. She also started concurrent radiotherapy last week. The patient is tolerating her treatment well with no concerning adverse effect except for mild fatigue. She had CT scan of the chest performed recently. I personally and independently reviewed the scan images and discuss the results and showed the images to the patient and her husband. Her scan showed significant improvement in her disease. I recommended for the patient to proceed with cycle #3 of her chemotherapy today. She will also continue with the concurrent radiotherapy. For neutropenic precaution, I recommended for the patient to continue with Onpro on day 3 of her chemotherapy. For the COPD, she will continue her current treatment with Spiriva, Advair and albuterol. For anxiety, she will continue on Ativan. The patient would come back for follow-up visit in 3 weeks for evaluation before starting cycle #4. She was advised to call immediately if she has any concerning symptoms in the interval. The patient voices understanding of current disease status and treatment options and is in agreement with the current care plan.  All questions were answered. The patient knows to call the clinic with any problems, questions or concerns. We can  certainly see the patient much sooner if necessary.  Disclaimer: This note was dictated with voice recognition software. Similar sounding words can inadvertently be transcribed and may not be corrected upon review.

## 2016-12-20 NOTE — Patient Instructions (Signed)
[facility] Discharge Instructions for Patients Receiving Chemotherapy  Today you received the following chemotherapy agents:  Cisplatin, Etoposide  To help prevent nausea and vomiting after your treatment, we encourage you to take your nausea medication as prescribed.   If you develop nausea and vomiting that is not controlled by your nausea medication, call the clinic.   BELOW ARE SYMPTOMS THAT SHOULD BE REPORTED IMMEDIATELY:  *FEVER GREATER THAN 100.5 F  *CHILLS WITH OR WITHOUT FEVER  NAUSEA AND VOMITING THAT IS NOT CONTROLLED WITH YOUR NAUSEA MEDICATION  *UNUSUAL SHORTNESS OF BREATH  *UNUSUAL BRUISING OR BLEEDING  TENDERNESS IN MOUTH AND THROAT WITH OR WITHOUT PRESENCE OF ULCERS  *URINARY PROBLEMS  *BOWEL PROBLEMS  UNUSUAL RASH Items with * indicate a potential emergency and should be followed up as soon as possible.  Feel free to call the clinic you have any questions or concerns. The clinic phone number is [phone].  Please show the [facility] at check-in to the Emergency Department and triage nurse.

## 2016-12-21 ENCOUNTER — Ambulatory Visit (HOSPITAL_BASED_OUTPATIENT_CLINIC_OR_DEPARTMENT_OTHER): Payer: BLUE CROSS/BLUE SHIELD

## 2016-12-21 ENCOUNTER — Ambulatory Visit
Admission: RE | Admit: 2016-12-21 | Discharge: 2016-12-21 | Disposition: A | Discharge status: Home / Self Care | Payer: BLUE CROSS/BLUE SHIELD | Source: Ambulatory Visit | Attending: Radiation Oncology | Admitting: Radiation Oncology

## 2016-12-21 ENCOUNTER — Telehealth: Payer: Self-pay | Admitting: Internal Medicine

## 2016-12-21 VITALS — BP 122/83 | HR 97 | Temp 98.6°F | Resp 18

## 2016-12-21 DIAGNOSIS — Z5111 Encounter for antineoplastic chemotherapy: Secondary | ICD-10-CM | POA: Diagnosis not present

## 2016-12-21 DIAGNOSIS — Z510 Encounter for antineoplastic radiation therapy: Secondary | ICD-10-CM | POA: Diagnosis not present

## 2016-12-21 DIAGNOSIS — C3411 Malignant neoplasm of upper lobe, right bronchus or lung: Secondary | ICD-10-CM | POA: Diagnosis not present

## 2016-12-21 DIAGNOSIS — C3491 Malignant neoplasm of unspecified part of right bronchus or lung: Secondary | ICD-10-CM

## 2016-12-21 MED ORDER — DEXAMETHASONE SODIUM PHOSPHATE 10 MG/ML IJ SOLN
10.0000 mg | Freq: Once | INTRAMUSCULAR | Status: AC
  Administered 2016-12-21: 10 mg via INTRAVENOUS

## 2016-12-21 MED ORDER — SODIUM CHLORIDE 0.9 % IV SOLN
120.0000 mg/m2 | Freq: Once | INTRAVENOUS | Status: AC
  Administered 2016-12-21: 250 mg via INTRAVENOUS
  Filled 2016-12-21: qty 12.5

## 2016-12-21 MED ORDER — DEXAMETHASONE SODIUM PHOSPHATE 10 MG/ML IJ SOLN
INTRAMUSCULAR | Status: AC
  Filled 2016-12-21: qty 1

## 2016-12-21 MED ORDER — SODIUM CHLORIDE 0.9 % IV SOLN
Freq: Once | INTRAVENOUS | Status: AC
  Administered 2016-12-21: 10:00:00 via INTRAVENOUS

## 2016-12-21 NOTE — Telephone Encounter (Signed)
Patient already on schedule for weekly lab and lab/fu/tx q3w. Next lab/fu/tx remains as scheduled for 1/24 - no availability to move and coordinate lab/fu/xrt/inf for 1/22.

## 2016-12-21 NOTE — Patient Instructions (Signed)
[facility] Discharge Instructions for Patients Receiving Chemotherapy  Today you received the following chemotherapy agents Etoposide.   To help prevent nausea and vomiting after your treatment, we encourage you to take your nausea medication as prescribed.   If you develop nausea and vomiting that is not controlled by your nausea medication, call the clinic.   BELOW ARE SYMPTOMS THAT SHOULD BE REPORTED IMMEDIATELY:  *FEVER GREATER THAN 100.5 F  *CHILLS WITH OR WITHOUT FEVER  NAUSEA AND VOMITING THAT IS NOT CONTROLLED WITH YOUR NAUSEA MEDICATION  *UNUSUAL SHORTNESS OF BREATH  *UNUSUAL BRUISING OR BLEEDING  TENDERNESS IN MOUTH AND THROAT WITH OR WITHOUT PRESENCE OF ULCERS  *URINARY PROBLEMS  *BOWEL PROBLEMS  UNUSUAL RASH Items with * indicate a potential emergency and should be followed up as soon as possible.  Feel free to call the clinic you have any questions or concerns. The clinic phone number is [phone].  Please show the CHEMO ALERT CARD at check-in to the Emergency Department and triage nurse.   

## 2016-12-22 ENCOUNTER — Other Ambulatory Visit: Payer: Self-pay | Admitting: Internal Medicine

## 2016-12-22 ENCOUNTER — Ambulatory Visit
Admission: RE | Admit: 2016-12-22 | Discharge: 2016-12-22 | Disposition: A | Discharge status: Home / Self Care | Payer: BLUE CROSS/BLUE SHIELD | Source: Ambulatory Visit | Attending: Radiation Oncology | Admitting: Radiation Oncology

## 2016-12-22 ENCOUNTER — Ambulatory Visit (HOSPITAL_BASED_OUTPATIENT_CLINIC_OR_DEPARTMENT_OTHER): Payer: BLUE CROSS/BLUE SHIELD

## 2016-12-22 ENCOUNTER — Ambulatory Visit: Payer: BLUE CROSS/BLUE SHIELD | Admitting: Radiation Oncology

## 2016-12-22 VITALS — BP 132/71 | HR 82 | Temp 98.2°F | Resp 18

## 2016-12-22 DIAGNOSIS — C3491 Malignant neoplasm of unspecified part of right bronchus or lung: Secondary | ICD-10-CM

## 2016-12-22 DIAGNOSIS — Z510 Encounter for antineoplastic radiation therapy: Secondary | ICD-10-CM | POA: Diagnosis not present

## 2016-12-22 DIAGNOSIS — C3411 Malignant neoplasm of upper lobe, right bronchus or lung: Secondary | ICD-10-CM | POA: Diagnosis not present

## 2016-12-22 DIAGNOSIS — Z5111 Encounter for antineoplastic chemotherapy: Secondary | ICD-10-CM

## 2016-12-22 DIAGNOSIS — Z5189 Encounter for other specified aftercare: Secondary | ICD-10-CM | POA: Diagnosis not present

## 2016-12-22 MED ORDER — SODIUM CHLORIDE 0.9 % IV SOLN
120.0000 mg/m2 | Freq: Once | INTRAVENOUS | Status: AC
  Administered 2016-12-22: 250 mg via INTRAVENOUS
  Filled 2016-12-22: qty 12.5

## 2016-12-22 MED ORDER — PEGFILGRASTIM 6 MG/0.6ML ~~LOC~~ PSKT
6.0000 mg | PREFILLED_SYRINGE | Freq: Once | SUBCUTANEOUS | Status: AC
  Administered 2016-12-22: 6 mg via SUBCUTANEOUS
  Filled 2016-12-22: qty 0.6

## 2016-12-22 MED ORDER — DEXAMETHASONE SODIUM PHOSPHATE 10 MG/ML IJ SOLN
10.0000 mg | Freq: Once | INTRAMUSCULAR | Status: AC
  Administered 2016-12-22: 10 mg via INTRAVENOUS

## 2016-12-22 MED ORDER — DEXAMETHASONE SODIUM PHOSPHATE 10 MG/ML IJ SOLN
INTRAMUSCULAR | Status: AC
  Filled 2016-12-22: qty 1

## 2016-12-22 MED ORDER — SODIUM CHLORIDE 0.9 % IV SOLN
Freq: Once | INTRAVENOUS | Status: AC
  Administered 2016-12-22: 10:00:00 via INTRAVENOUS

## 2016-12-22 NOTE — Patient Instructions (Signed)
[facility] Discharge Instructions for Patients Receiving Chemotherapy  Today you received the following chemotherapy agents Etoposide.   To help prevent nausea and vomiting after your treatment, we encourage you to take your nausea medication as prescribed.   If you develop nausea and vomiting that is not controlled by your nausea medication, call the clinic.   BELOW ARE SYMPTOMS THAT SHOULD BE REPORTED IMMEDIATELY:  *FEVER GREATER THAN 100.5 F  *CHILLS WITH OR WITHOUT FEVER  NAUSEA AND VOMITING THAT IS NOT CONTROLLED WITH YOUR NAUSEA MEDICATION  *UNUSUAL SHORTNESS OF BREATH  *UNUSUAL BRUISING OR BLEEDING  TENDERNESS IN MOUTH AND THROAT WITH OR WITHOUT PRESENCE OF ULCERS  *URINARY PROBLEMS  *BOWEL PROBLEMS  UNUSUAL RASH Items with * indicate a potential emergency and should be followed up as soon as possible.  Feel free to call the clinic you have any questions or concerns. The clinic phone number is [phone].  Please show the CHEMO ALERT CARD at check-in to the Emergency Department and triage nurse.   

## 2016-12-23 ENCOUNTER — Ambulatory Visit
Admission: RE | Admit: 2016-12-23 | Discharge: 2016-12-23 | Disposition: A | Discharge status: Home / Self Care | Payer: BLUE CROSS/BLUE SHIELD | Source: Ambulatory Visit | Attending: Radiation Oncology | Admitting: Radiation Oncology

## 2016-12-23 DIAGNOSIS — Z510 Encounter for antineoplastic radiation therapy: Secondary | ICD-10-CM | POA: Diagnosis not present

## 2016-12-26 ENCOUNTER — Ambulatory Visit
Admission: RE | Admit: 2016-12-26 | Discharge: 2016-12-26 | Disposition: A | Discharge status: Home / Self Care | Payer: BLUE CROSS/BLUE SHIELD | Source: Ambulatory Visit | Attending: Radiation Oncology | Admitting: Radiation Oncology

## 2016-12-26 ENCOUNTER — Ambulatory Visit
Admission: RE | Admit: 2016-12-26 | Payer: BLUE CROSS/BLUE SHIELD | Source: Ambulatory Visit | Admitting: Radiation Oncology

## 2016-12-26 DIAGNOSIS — Z510 Encounter for antineoplastic radiation therapy: Secondary | ICD-10-CM | POA: Diagnosis not present

## 2016-12-27 ENCOUNTER — Ambulatory Visit
Admission: RE | Admit: 2016-12-27 | Discharge: 2016-12-27 | Disposition: A | Discharge status: Home / Self Care | Payer: BLUE CROSS/BLUE SHIELD | Source: Ambulatory Visit | Attending: Radiation Oncology | Admitting: Radiation Oncology

## 2016-12-27 ENCOUNTER — Other Ambulatory Visit: Payer: BLUE CROSS/BLUE SHIELD

## 2016-12-27 VITALS — BP 126/74 | HR 107 | Temp 97.9°F | Resp 16 | Wt 199.8 lb

## 2016-12-27 DIAGNOSIS — C3491 Malignant neoplasm of unspecified part of right bronchus or lung: Secondary | ICD-10-CM

## 2016-12-27 DIAGNOSIS — Z510 Encounter for antineoplastic radiation therapy: Secondary | ICD-10-CM | POA: Diagnosis not present

## 2016-12-27 NOTE — Progress Notes (Signed)
  Radiation Oncology         [phone] ________________________________  Name: Tammy Bowers MRN: [id number]  Date: 12/27/2016  DOB: 12/08/1951  Weekly Radiation Therapy Management    ICD-9-CM ICD-10-CM   1. Small cell lung carcinoma, right (HCC) 162.9 C34.91      Current Dose: 18 Gy     Planned Dose:  59.4 Gy  Narrative . . . . . . . . The patient presents for routine under treatment assessment.                                    Weight stable. Heart rate slightly elevated. Reports intermittent stabbing chest pain. Reports managing this pain with breathing techniques and Flexeril. Denies shortness of breath. Reports a persistent cough that's only occasionally productive with white sputum. Denies hemoptysis. Reports difficulty associated with swallowing, but denies pain. Reports difficulty with swallowing is mostly associated with swallowing pills. No skin changes noted within treatment field. Reports using radiaplex as directed. Reports great fatigue.                                  Set-up films were reviewed.                                 The chart was checked. Physical Findings. . .  weight is 199 lb 12.8 oz (90.6 kg). Her oral temperature is 97.9 F (36.6 C). Her blood pressure is 126/74 and her pulse is 107 (abnormal). Her respiration is 16 and oxygen saturation is 100%. .  Weight essentially stable. Lungs are clear to auscultation bilaterally. Heart has regular rhythm, but slightly elevated rate. Impression . . . . . . . The patient is tolerating radiation. Plan . . . . . . . . . . . . Continue treatment as planned. She'll force fluids when she goes home given her elevated heart rate. ________________________________   Blair Promise, PhD, MD  This document serves as a record of services personally performed by Gery Pray, MD. It was created on his behalf by Darcus Austin, a trained medical scribe. The creation of this record is based on the scribe's personal observations and  the provider's statements to them. This document has been checked and approved by the attending provider.

## 2016-12-27 NOTE — Progress Notes (Signed)
Weight stable. Heart rate slightly elevated. Reports intermittent stabbing chest pain. Reports managing this pain with breathing techniques and Flexeril. Reports a persistent cough that's only occasionally productive with white sputum. Denies hemoptysis. Reports difficulty associated with swallowing but, denies pain. Reports difficulty with swallowing is mostly associated with swallowing pills. No skin changes noted within treatment field. Reports using radiaplex as directed. Reports great fatigue.   BP 126/74 (BP Location: Left Arm, Patient Position: Sitting, Cuff Size: Normal)   Pulse (!) 107   Temp 97.9 F (36.6 C) (Oral)   Resp 16   Wt 199 lb 12.8 oz (90.6 kg)   SpO2 100%   BMI 32.25 kg/m  Wt Readings from Last 3 Encounters:  12/27/16 199 lb 12.8 oz (90.6 kg)  12/20/16 203 lb 8 oz (92.3 kg)  12/16/16 203 lb (92.1 kg)

## 2016-12-28 ENCOUNTER — Ambulatory Visit
Admission: RE | Admit: 2016-12-28 | Discharge: 2016-12-28 | Disposition: A | Discharge status: Home / Self Care | Payer: BLUE CROSS/BLUE SHIELD | Source: Ambulatory Visit | Attending: Radiation Oncology | Admitting: Radiation Oncology

## 2016-12-28 DIAGNOSIS — Z510 Encounter for antineoplastic radiation therapy: Secondary | ICD-10-CM | POA: Diagnosis not present

## 2016-12-29 ENCOUNTER — Ambulatory Visit
Admission: RE | Admit: 2016-12-29 | Discharge: 2016-12-29 | Disposition: A | Discharge status: Home / Self Care | Payer: BLUE CROSS/BLUE SHIELD | Source: Ambulatory Visit | Attending: Radiation Oncology | Admitting: Radiation Oncology

## 2016-12-29 DIAGNOSIS — Z510 Encounter for antineoplastic radiation therapy: Secondary | ICD-10-CM | POA: Diagnosis not present

## 2016-12-30 ENCOUNTER — Ambulatory Visit
Admission: RE | Admit: 2016-12-30 | Discharge: 2016-12-30 | Disposition: A | Discharge status: Home / Self Care | Payer: BLUE CROSS/BLUE SHIELD | Source: Ambulatory Visit | Attending: Radiation Oncology | Admitting: Radiation Oncology

## 2016-12-30 DIAGNOSIS — Z510 Encounter for antineoplastic radiation therapy: Secondary | ICD-10-CM | POA: Diagnosis not present

## 2017-01-02 ENCOUNTER — Ambulatory Visit
Admission: RE | Admit: 2017-01-02 | Discharge: 2017-01-02 | Disposition: A | Discharge status: Home / Self Care | Payer: BLUE CROSS/BLUE SHIELD | Source: Ambulatory Visit | Attending: Radiation Oncology | Admitting: Radiation Oncology

## 2017-01-02 ENCOUNTER — Encounter: Payer: Self-pay | Admitting: Radiation Oncology

## 2017-01-02 ENCOUNTER — Other Ambulatory Visit (HOSPITAL_BASED_OUTPATIENT_CLINIC_OR_DEPARTMENT_OTHER): Payer: BLUE CROSS/BLUE SHIELD

## 2017-01-02 VITALS — BP 117/103 | HR 94 | Temp 98.3°F | Resp 16 | Wt 200.0 lb

## 2017-01-02 DIAGNOSIS — C3411 Malignant neoplasm of upper lobe, right bronchus or lung: Secondary | ICD-10-CM | POA: Diagnosis not present

## 2017-01-02 DIAGNOSIS — Z510 Encounter for antineoplastic radiation therapy: Secondary | ICD-10-CM | POA: Diagnosis not present

## 2017-01-02 DIAGNOSIS — C3491 Malignant neoplasm of unspecified part of right bronchus or lung: Secondary | ICD-10-CM

## 2017-01-02 LAB — COMPREHENSIVE METABOLIC PANEL
ALT: 17 U/L (ref 0–55)
AST: 20 U/L (ref 5–34)
Albumin: 3.8 g/dL (ref 3.5–5.0)
Alkaline Phosphatase: 130 U/L (ref 40–150)
Anion Gap: 11 mEq/L (ref 3–11)
BUN: 19.6 mg/dL (ref 7.0–26.0)
CO2: 25 mEq/L (ref 22–29)
Calcium: 9.4 mg/dL (ref 8.4–10.4)
Chloride: 106 mEq/L (ref 98–109)
Creatinine: 1.1 mg/dL (ref 0.6–1.1)
EGFR: 53 mL/min/{1.73_m2} — ABNORMAL LOW (ref >90)
Glucose: 93 mg/dl (ref 70–140)
Potassium: 4.3 mEq/L (ref 3.5–5.1)
Sodium: 142 mEq/L (ref 136–145)
Total Bilirubin: 0.27 mg/dL (ref 0.20–1.20)
Total Protein: 6.6 g/dL (ref 6.4–8.3)

## 2017-01-02 LAB — CBC WITH DIFFERENTIAL/PLATELET
BASO%: 0.6 % (ref 0.0–2.0)
Basophils Absolute: 0.1 10*3/uL (ref 0.0–0.1)
EOS%: 1 % (ref 0.0–7.0)
Eosinophils Absolute: 0.1 10*3/uL (ref 0.0–0.5)
HCT: 34.3 % — ABNORMAL LOW (ref 34.8–46.6)
HGB: 11.5 g/dL — ABNORMAL LOW (ref 11.6–15.9)
LYMPH%: 10.7 % — ABNORMAL LOW (ref 14.0–49.7)
MCH: 29.1 pg (ref 25.1–34.0)
MCHC: 33.4 g/dL (ref 31.5–36.0)
MCV: 87.2 fL (ref 79.5–101.0)
MONO#: 0.5 10*3/uL (ref 0.1–0.9)
MONO%: 5.3 % (ref 0.0–14.0)
NEUT#: 8.4 10*3/uL — ABNORMAL HIGH (ref 1.5–6.5)
NEUT%: 82.4 % — ABNORMAL HIGH (ref 38.4–76.8)
Platelets: 98 10*3/uL — ABNORMAL LOW (ref 145–400)
RBC: 3.94 10*6/uL (ref 3.70–5.45)
RDW: 16.3 % — ABNORMAL HIGH (ref 11.2–14.5)
WBC: 10.2 10*3/uL (ref 3.9–10.3)
lymph#: 1.1 10*3/uL (ref 0.9–3.3)

## 2017-01-02 LAB — MAGNESIUM: Magnesium: 2 mg/dl (ref 1.5–2.5)

## 2017-01-02 NOTE — Progress Notes (Signed)
  Radiation Oncology         [phone]   Name: Tammy Bowers MRN: [id number]   Date: 01/02/2017  DOB: March 23, 1951    Weekly Radiation Therapy Management    ICD-9-CM ICD-10-CM   1. Small cell lung carcinoma, right (HCC) 162.9 C34.91     Current Dose: 25.2 Gy  Planned Dose:  59.4 Gy  Narrative The patient presents for routine under treatment assessment.  Patient notes a 4/10 constant, sharp, aching pain over mid bilateral anterior chest wall. She is positive for wheezing, shortness of breath, walking and coughing, productive cough with white phlegm. Patient reports dysphagia for pills. Patient complains of fatigue, weakness, and loss of sleep. Per nursing, skin is intact without redness. Patient continues to use Radiaplex.  Set-up films were reviewed. The chart was checked.  Physical Findings  weight is 200 lb (90.7 kg). Her oral temperature is 98.3 F (36.8 C). Her blood pressure is 117/103 (abnormal) and her pulse is 94. Her respiration is 16 and oxygen saturation is 100%. . Weight essentially stable.  No significant changes.  Impression The patient is tolerating radiation.  Plan Continue treatment as planned.         Sheral Apley Tammi Klippel, M.D. This document serves as a record of services personally performed by [name], MD. It was created on his behalf by Bethann Humble, a trained medical scribe. The creation of this record is based on the scribe's personal observations and the provider's statements to them. This document has been checked and approved by the attending provider.

## 2017-01-02 NOTE — Progress Notes (Signed)
PAIN: She rates her pain as a 4 on a scale of 0-10. constant, sharp, burning, dull and aching over mid bilateral anterior chest wall.  . RESPIRATORY: Wheezing, Shortness of Breath  Walking and Coughing  Productive and Color of Phlegm  white. Pt is on room air. Reports skin in intact, no redness. Continues Radiaplex SWALLOWING/DIET: Pt has had dysphagia for pills. OTHER: Pt complains of fatigue, weakness and loss of sleep. BP (!) 117/103   Pulse 94   Temp 98.3 F (36.8 C) (Oral)   Resp 16   Wt 200 lb (90.7 kg)   SpO2 100%   BMI 32.28 kg/m  Wt Readings from Last 3 Encounters:  01/02/17 200 lb (90.7 kg)  12/27/16 199 lb 12.8 oz (90.6 kg)  12/20/16 203 lb 8 oz (92.3 kg)

## 2017-01-03 ENCOUNTER — Ambulatory Visit
Admission: RE | Admit: 2017-01-03 | Discharge: 2017-01-03 | Disposition: A | Discharge status: Home / Self Care | Payer: BLUE CROSS/BLUE SHIELD | Source: Ambulatory Visit | Attending: Radiation Oncology | Admitting: Radiation Oncology

## 2017-01-03 DIAGNOSIS — Z510 Encounter for antineoplastic radiation therapy: Secondary | ICD-10-CM | POA: Diagnosis not present

## 2017-01-04 ENCOUNTER — Ambulatory Visit: Payer: BLUE CROSS/BLUE SHIELD

## 2017-01-05 ENCOUNTER — Ambulatory Visit: Payer: BLUE CROSS/BLUE SHIELD

## 2017-01-05 ENCOUNTER — Inpatient Hospital Stay
Admission: RE | Admit: 2017-01-05 | Payer: BLUE CROSS/BLUE SHIELD | Source: Ambulatory Visit | Admitting: Radiation Oncology

## 2017-01-06 ENCOUNTER — Ambulatory Visit: Payer: BLUE CROSS/BLUE SHIELD | Admitting: Radiation Oncology

## 2017-01-06 ENCOUNTER — Ambulatory Visit
Admission: RE | Admit: 2017-01-06 | Discharge: 2017-01-06 | Disposition: A | Discharge status: Home / Self Care | Payer: BLUE CROSS/BLUE SHIELD | Source: Ambulatory Visit | Attending: Radiation Oncology | Admitting: Radiation Oncology

## 2017-01-06 VITALS — BP 96/83 | HR 118 | Temp 98.0°F | Resp 18 | Wt 199.6 lb

## 2017-01-06 DIAGNOSIS — Z510 Encounter for antineoplastic radiation therapy: Secondary | ICD-10-CM | POA: Diagnosis not present

## 2017-01-06 DIAGNOSIS — C3491 Malignant neoplasm of unspecified part of right bronchus or lung: Secondary | ICD-10-CM

## 2017-01-06 NOTE — Progress Notes (Signed)
Weight stable. Heart rate elevated. Advised to follow up with PCP about elevated heart rate as soon as possible. Reports persistent sharp burning chest wall pain is more localized and manageable. Reports wheezing and SOB continues. Reports increased frequency of productive cough with white sputum. Denies hemoptysis. Denies pain associated with swallowing. Reports dysphagia associated with swallowing pills continues.No skin changes within treatment field. Reports she continues to use raidaplex as directed.   BP 96/83 (BP Location: Right Arm, Patient Position: Sitting, Cuff Size: Large)   Pulse (!) 118   Temp 98 F (36.7 C) (Oral)   Resp 18   Wt 199 lb 9.6 oz (90.5 kg)   SpO2 98%   BMI 32.22 kg/m  Wt Readings from Last 3 Encounters:  01/06/17 199 lb 9.6 oz (90.5 kg)  01/02/17 200 lb (90.7 kg)  12/27/16 199 lb 12.8 oz (90.6 kg)

## 2017-01-06 NOTE — Progress Notes (Signed)
  Radiation Oncology         [phone]   Name: Tammy Bowers MRN: [id number]   Date: 01/06/2017  DOB: 15-Jun-1951    Weekly Radiation Therapy Management    ICD-9-CM ICD-10-CM   1. Small cell lung carcinoma, right (HCC) 162.9 C34.91     Current Dose: 28.8 Gy  Planned Dose:  59.4 Gy  Narrative The patient presents for routine under treatment assessment.  Heart rate elevated. The nurse advised the patient to follow up with her PCP about elevated heart rate as soon as possible. The patient reports not drinking much water yesterday. Reports persistent sharp burning chest wall pain is more localized and manageable. Reports wheezing and SOB continues. Reports increased frequency of productive cough with white sputum. Denies hemoptysis. Denies pain associated with swallowing. Reports dysphagia associated with swallowing pills continues. No skin changes within treatment field. Reports she continues to use raidaplex as directed.  Set-up films were reviewed. The chart was checked.  Physical Findings  weight is 199 lb 9.6 oz (90.5 kg). Her oral temperature is 98 F (36.7 C). Her blood pressure is 96/83 and her pulse is 118 (abnormal). Her respiration is 18 and oxygen saturation is 98%. . Weight essentially stable. Skin turgor is poor.  Impression The patient is tolerating radiation. Patient's systolic BP is low and skin turgor is poor, indicated dehydration.  Plan Continue treatment as planned. I advised the patient to increase her water intake. We discussed IV fluids in symptom management clinic. She would like to force fluids herself and see her PCP, I agreed.      Sheral Apley Tammi Klippel, M.D.  This document serves as a record of services personally performed by Tammy Caldron, PA-C and [name], MD. It was created on their behalf by Tammy Bowers, a trained medical scribe. The creation of this record is based on the scribe's personal observations and the providers' statements to them. This  document has been checked and approved by the attending provider.

## 2017-01-09 ENCOUNTER — Ambulatory Visit
Admission: RE | Admit: 2017-01-09 | Discharge: 2017-01-09 | Disposition: A | Discharge status: Home / Self Care | Payer: BLUE CROSS/BLUE SHIELD | Source: Ambulatory Visit | Attending: Radiation Oncology | Admitting: Radiation Oncology

## 2017-01-09 ENCOUNTER — Encounter: Payer: BLUE CROSS/BLUE SHIELD | Admitting: Radiation Oncology

## 2017-01-09 DIAGNOSIS — Z510 Encounter for antineoplastic radiation therapy: Secondary | ICD-10-CM | POA: Diagnosis not present

## 2017-01-10 ENCOUNTER — Ambulatory Visit
Admission: RE | Admit: 2017-01-10 | Discharge: 2017-01-10 | Disposition: A | Discharge status: Home / Self Care | Payer: BLUE CROSS/BLUE SHIELD | Source: Ambulatory Visit | Attending: Radiation Oncology | Admitting: Radiation Oncology

## 2017-01-10 DIAGNOSIS — Z510 Encounter for antineoplastic radiation therapy: Secondary | ICD-10-CM | POA: Diagnosis not present

## 2017-01-11 ENCOUNTER — Encounter: Payer: Self-pay | Admitting: Internal Medicine

## 2017-01-11 ENCOUNTER — Ambulatory Visit
Admission: RE | Admit: 2017-01-11 | Discharge: 2017-01-11 | Disposition: A | Discharge status: Home / Self Care | Payer: BLUE CROSS/BLUE SHIELD | Source: Ambulatory Visit | Attending: Radiation Oncology | Admitting: Radiation Oncology

## 2017-01-11 ENCOUNTER — Other Ambulatory Visit (HOSPITAL_BASED_OUTPATIENT_CLINIC_OR_DEPARTMENT_OTHER): Payer: BLUE CROSS/BLUE SHIELD

## 2017-01-11 ENCOUNTER — Other Ambulatory Visit: Payer: Self-pay | Admitting: Internal Medicine

## 2017-01-11 ENCOUNTER — Ambulatory Visit (HOSPITAL_BASED_OUTPATIENT_CLINIC_OR_DEPARTMENT_OTHER): Payer: BLUE CROSS/BLUE SHIELD

## 2017-01-11 ENCOUNTER — Ambulatory Visit (HOSPITAL_BASED_OUTPATIENT_CLINIC_OR_DEPARTMENT_OTHER): Payer: BLUE CROSS/BLUE SHIELD | Admitting: Internal Medicine

## 2017-01-11 VITALS — BP 141/96 | HR 108 | Resp 18

## 2017-01-11 VITALS — BP 136/85 | HR 107 | Temp 98.2°F | Resp 18 | Ht 66.0 in | Wt 201.2 lb

## 2017-01-11 DIAGNOSIS — C3491 Malignant neoplasm of unspecified part of right bronchus or lung: Secondary | ICD-10-CM

## 2017-01-11 DIAGNOSIS — Z510 Encounter for antineoplastic radiation therapy: Secondary | ICD-10-CM | POA: Diagnosis not present

## 2017-01-11 DIAGNOSIS — R000 Tachycardia, unspecified: Secondary | ICD-10-CM

## 2017-01-11 DIAGNOSIS — C3411 Malignant neoplasm of upper lobe, right bronchus or lung: Secondary | ICD-10-CM

## 2017-01-11 DIAGNOSIS — Z5111 Encounter for antineoplastic chemotherapy: Secondary | ICD-10-CM | POA: Diagnosis not present

## 2017-01-11 LAB — CBC WITH DIFFERENTIAL/PLATELET
BASO%: 0.7 % (ref 0.0–2.0)
Basophils Absolute: 0 10*3/uL (ref 0.0–0.1)
EOS%: 1.5 % (ref 0.0–7.0)
Eosinophils Absolute: 0.1 10*3/uL (ref 0.0–0.5)
HCT: 33.6 % — ABNORMAL LOW (ref 34.8–46.6)
HGB: 11.3 g/dL — ABNORMAL LOW (ref 11.6–15.9)
LYMPH%: 10 % — ABNORMAL LOW (ref 14.0–49.7)
MCH: 29.4 pg (ref 25.1–34.0)
MCHC: 33.7 g/dL (ref 31.5–36.0)
MCV: 87.1 fL (ref 79.5–101.0)
MONO#: 0.5 10*3/uL (ref 0.1–0.9)
MONO%: 8.8 % (ref 0.0–14.0)
NEUT#: 4.9 10*3/uL (ref 1.5–6.5)
NEUT%: 79 % — ABNORMAL HIGH (ref 38.4–76.8)
Platelets: 354 10*3/uL (ref 145–400)
RBC: 3.85 10*6/uL (ref 3.70–5.45)
RDW: 17.7 % — ABNORMAL HIGH (ref 11.2–14.5)
WBC: 6.2 10*3/uL (ref 3.9–10.3)
lymph#: 0.6 10*3/uL — ABNORMAL LOW (ref 0.9–3.3)

## 2017-01-11 LAB — COMPREHENSIVE METABOLIC PANEL
ALT: 11 U/L (ref 0–55)
AST: 18 U/L (ref 5–34)
Albumin: 3.8 g/dL (ref 3.5–5.0)
Alkaline Phosphatase: 110 U/L (ref 40–150)
Anion Gap: 10 mEq/L (ref 3–11)
BUN: 29 mg/dL — ABNORMAL HIGH (ref 7.0–26.0)
CO2: 23 mEq/L (ref 22–29)
Calcium: 9.9 mg/dL (ref 8.4–10.4)
Chloride: 105 mEq/L (ref 98–109)
Creatinine: 0.9 mg/dL (ref 0.6–1.1)
EGFR: 66 mL/min/{1.73_m2} — ABNORMAL LOW (ref >90)
Glucose: 83 mg/dl (ref 70–140)
Potassium: 4.6 mEq/L (ref 3.5–5.1)
Sodium: 138 mEq/L (ref 136–145)
Total Bilirubin: 0.31 mg/dL (ref 0.20–1.20)
Total Protein: 6.8 g/dL (ref 6.4–8.3)

## 2017-01-11 LAB — MAGNESIUM: Magnesium: 2 mg/dl (ref 1.5–2.5)

## 2017-01-11 MED ORDER — ETOPOSIDE CHEMO INJECTION 1 GM/50ML
120.0000 mg/m2 | Freq: Once | INTRAVENOUS | Status: AC
  Administered 2017-01-11: 250 mg via INTRAVENOUS
  Filled 2017-01-11: qty 12.5

## 2017-01-11 MED ORDER — PALONOSETRON HCL INJECTION 0.25 MG/5ML
INTRAVENOUS | Status: AC
  Filled 2017-01-11: qty 5

## 2017-01-11 MED ORDER — SODIUM CHLORIDE 0.9 % IV SOLN
Freq: Once | INTRAVENOUS | Status: AC
  Administered 2017-01-11: 11:00:00 via INTRAVENOUS

## 2017-01-11 MED ORDER — POTASSIUM CHLORIDE 2 MEQ/ML IV SOLN
Freq: Once | INTRAVENOUS | Status: AC
  Administered 2017-01-11: 11:00:00 via INTRAVENOUS
  Filled 2017-01-11: qty 10

## 2017-01-11 MED ORDER — FOSAPREPITANT DIMEGLUMINE INJECTION 150 MG
Freq: Once | INTRAVENOUS | Status: AC
  Administered 2017-01-11: 13:00:00 via INTRAVENOUS
  Filled 2017-01-11: qty 5

## 2017-01-11 MED ORDER — SODIUM CHLORIDE 0.9 % IV SOLN
60.0000 mg/m2 | Freq: Once | INTRAVENOUS | Status: AC
  Administered 2017-01-11: 127 mg via INTRAVENOUS
  Filled 2017-01-11: qty 127

## 2017-01-11 MED ORDER — PALONOSETRON HCL INJECTION 0.25 MG/5ML
0.2500 mg | Freq: Once | INTRAVENOUS | Status: AC
  Administered 2017-01-11: 0.25 mg via INTRAVENOUS

## 2017-01-11 NOTE — Progress Notes (Signed)
[facility] [phone]   [phone]  OFFICE PROGRESS NOTE  Lujean Amel, MD [address] [place] [address]  DIAGNOSIS: Limited stage (T2b, N2, M0) small cell lung cancer diagnosed in November 2017 and presented with large right hilar mass with mediastinal invasion and a right lower lobe pulmonary nodule.  PRIOR THERAPY: None  CURRENT THERAPY: Systemic chemotherapy with cisplatin 60 MG/M2 on day 1 and etoposide 120 MG/M2 on days 1, 2 and 3 concurrent with radiation status post 3 cycles.  INTERVAL HISTORY: Tammy Bowers 66 y.o. female came to the clinic today for follow-up visit. The patient is currently undergoing systemic chemotherapy with cisplatin and etoposide status post 3 cycles. She is tolerating her treatment well with no significant adverse effects. The patient denied having any current nausea, vomiting, diarrhea or constipation. She has no chest pain, shortness of breath, cough or hemoptysis. She has occasional dizzy spells. She denied having any significant weight loss or night sweats. She is here today for evaluation before starting cycle #4.  MEDICAL HISTORY: Past Medical History:  Diagnosis Date  . Adenopathy 10/10/2016   PERICARINAL  . Anemia   . Arthritis   . Asthma   . Cancer ([place])   . COPD (chronic obstructive pulmonary disease) ([place])   . Dyspnea   . Headache    migraines  . Hilar mass 10/10/2016   RIGHT  . History of kidney stones   . Insomnia   . Lung nodules 10/10/2016   RIGHT LOWER LOBE  . Mass of both adrenal glands ([place]) 10/10/2016  . Mood swing ([place])   . Pneumonia   . Substance abuse    TOBACCO  . UNSPECIFIED INFECTION OF BONE ANKLE AND FOOT 10/22/2009   Annotation: left ankle Qualifier: Diagnosis of  By: Patsy Baltimore RN, Denise      ALLERGIES:  is allergic to lidocaine.  MEDICATIONS:  Current Outpatient Prescriptions  Medication Sig Dispense Refill  . albuterol (PROVENTIL  HFA;VENTOLIN HFA) 108 (90 BASE) MCG/ACT inhaler Inhale 2 puffs into the lungs every 4 (four) hours as needed for wheezing.    . Cyclobenzaprine HCl (FLEXERIL PO) Take 5 mg by mouth 3 (three) times daily as needed.    . Fluticasone-Salmeterol (ADVAIR) 500-50 MCG/DOSE AEPB Inhale 1 puff into the lungs 2 (two) times daily.    Marland Kitchen HYDROcodone-acetaminophen (NORCO/VICODIN) 5-325 MG tablet Take 1 tablet by mouth 2 (two) times daily as needed.    Marland Kitchen LORazepam (ATIVAN) 0.5 MG tablet Take 1 tablet (0.5 mg total) by mouth at bedtime. 20 tablet 0  . Misc Natural Products (GLUCOSAMINE CHOND COMPLEX/MSM PO) Take 1 tablet by mouth every other day.    . Multiple Vitamin (MULTIVITAMIN WITH MINERALS) TABS tablet Take 1 tablet by mouth every other day. Women's Multivitamin     . Omega-3 Fatty Acids (FISH OIL) 1000 MG CAPS Take 1,000 mg by mouth every other day.    Marland Kitchen OVER THE COUNTER MEDICATION Take 1 mL by mouth 2 (two) times daily. CBD Extract    . prochlorperazine (COMPAZINE) 10 MG tablet TAKE 1 TABLET BY MOUTH EVERY 6 HOURS AS NEEDED FOR NAUSEA AND VOMITING 30 tablet 0   No current facility-administered medications for this visit.     SURGICAL HISTORY:  Past Surgical History:  Procedure Laterality Date  . ANKLE SURGERY    . APPENDECTOMY    . LUNG BIOPSY N/A 10/19/2016   Procedure: LUNG BIOPSY;  Surgeon: Grace Isaac, MD;  Location: [facility] OR;  Service: Thoracic;  Laterality: N/A;  . TONSILLECTOMY    . VIDEO BRONCHOSCOPY WITH ENDOBRONCHIAL ULTRASOUND N/A 10/19/2016   Procedure: VIDEO BRONCHOSCOPY WITH ENDOBRONCHIAL ULTRASOUND;  Surgeon: Grace Isaac, MD;  Location: [facility] OR;  Service: Thoracic;  Laterality: N/A;    REVIEW OF SYSTEMS:  A comprehensive review of systems was negative except for: Constitutional: positive for fatigue   PHYSICAL EXAMINATION: General appearance: alert, cooperative and no distress Head: Normocephalic, without obvious abnormality, atraumatic Neck: no adenopathy, no JVD, supple,  symmetrical, trachea midline and thyroid not enlarged, symmetric, no tenderness/mass/nodules Lymph nodes: Cervical, supraclavicular, and axillary nodes normal. Resp: wheezes bilaterally Back: symmetric, no curvature. ROM normal. No CVA tenderness. Cardio: Tachycardic GI: soft, non-tender; bowel sounds normal; no masses,  no organomegaly Extremities: extremities normal, atraumatic, no cyanosis or edema  ECOG PERFORMANCE STATUS: 1 - Symptomatic but completely ambulatory  Blood pressure 136/85, pulse (!) 107, temperature 98.2 F (36.8 C), temperature source Oral, resp. rate 18, height '5\' 6"'$  (1.676 m), weight 201 lb 3.2 oz (91.3 kg), SpO2 98 %.  LABORATORY DATA: Lab Results  Component Value Date   WBC 6.2 01/11/2017   HGB 11.3 (L) 01/11/2017   HCT 33.6 (L) 01/11/2017   MCV 87.1 01/11/2017   PLT 354 01/11/2017      Chemistry      Component Value Date/Time   NA 138 01/11/2017 0916   K 4.6 01/11/2017 0916   CL 106 11/21/2016 0427   CO2 23 01/11/2017 0916   BUN 29.0 (H) 01/11/2017 0916   CREATININE 0.9 01/11/2017 0916      Component Value Date/Time   CALCIUM 9.9 01/11/2017 0916   ALKPHOS 110 01/11/2017 0916   AST 18 01/11/2017 0916   ALT 11 01/11/2017 0916   BILITOT 0.31 01/11/2017 0916       RADIOGRAPHIC STUDIES: Ct Chest W Contrast  Result Date: 12/16/2016 CLINICAL DATA:  Patient with history of lung cancer. Follow-up evaluation. EXAM: CT CHEST WITH CONTRAST TECHNIQUE: Multidetector CT imaging of the chest was performed during intravenous contrast administration. CONTRAST:  75cc ISOVUE-300 IOPAMIDOL (ISOVUE-300) INJECTION 61% COMPARISON:  PET-CT 10/21/2016. FINDINGS: Cardiovascular: Normal heart size. No pericardial effusion. Aorta and main pulmonary artery normal in caliber. Mediastinum/Nodes: Interval decrease in size of precarinal lymph node measuring 1.2 cm (image 56; series 2), previously 2.5 cm. Additional calcified mediastinal lymph nodes are grossly unchanged  including a 10 mm subcarinal lymph node (image 63; series 2). Normal esophagus. Lungs/Pleura: Significant interval decrease in size of right hilar mass measuring 2.5 x 1.7 cm (image 72; series 2), previously 5.7 x 4.8 cm. Significant interval decrease in previously described patchy consolidation within the superior segment right lower lobe. Minimal residual ground-glass and consolidative opacities are demonstrated (image 71; series 5). Persistent consolidative opacities within the right middle lobe. No pleural effusion or pneumothorax. Upper Abdomen: Re- demonstrated 3 cm lesion within the right adrenal gland, characterized as adenoma on prior exam. Musculoskeletal: No aggressive or acute appearing osseous lesions. Thoracic spine degenerative changes. IMPRESSION: Significant interval decrease in size of previously described mass within the right hilum, most compatible with treatment response. Significant interval decrease and near complete resolution of previously described adjacent consolidation within the superior segment right lower lobe, most compatible with resolving postobstructive infectious process. Persistent consolidation within the right middle lobe. Interval decrease in size of previously described mediastinal adenopathy. Electronically Signed   By: Lovey Newcomer M.D.   On: 12/16/2016 13:51    ASSESSMENT AND PLAN:  This is a very pleasant 66 years old white female recently diagnosed with limited stage small cell lung cancer. She is currently undergoing systemic chemotherapy with cisplatin and etoposide status post 3 cycles. This is concurrent with radiation. She is tolerating her treatment well with no significant adverse effects except for mild fatigue and nausea a few days after the treatment. I recommended for the patient to proceed with cycle #4 today as scheduled. For the tachycardia, I encouraged her to increase her oral intake and hydration. She will come back for follow-up visit in 3 weeks  for reevaluation with repeat CT scan of the chest for restaging of her disease. The patient was advised to call immediately if she has any concerning symptoms in the interval. The patient voices understanding of current disease status and treatment options and is in agreement with the current care plan.  All questions were answered. The patient knows to call the clinic with any problems, questions or concerns. We can certainly see the patient much sooner if necessary. I spent 10 minutes counseling the patient face to face. The total time spent in the appointment was 15 minutes.  Disclaimer: This note was dictated with voice recognition software. Similar sounding words can inadvertently be transcribed and may not be corrected upon review.

## 2017-01-11 NOTE — Patient Instructions (Signed)
[facility] Discharge Instructions for Patients Receiving Chemotherapy  Today you received the following chemotherapy agents Cisplatin/VP-16  To help prevent nausea and vomiting after your treatment, we encourage you to take your nausea medication    If you develop nausea and vomiting that is not controlled by your nausea medication, call the clinic.   BELOW ARE SYMPTOMS THAT SHOULD BE REPORTED IMMEDIATELY:  *FEVER GREATER THAN 100.5 F  *CHILLS WITH OR WITHOUT FEVER  NAUSEA AND VOMITING THAT IS NOT CONTROLLED WITH YOUR NAUSEA MEDICATION  *UNUSUAL SHORTNESS OF BREATH  *UNUSUAL BRUISING OR BLEEDING  TENDERNESS IN MOUTH AND THROAT WITH OR WITHOUT PRESENCE OF ULCERS  *URINARY PROBLEMS  *BOWEL PROBLEMS  UNUSUAL RASH Items with * indicate a potential emergency and should be followed up as soon as possible.  Feel free to call the clinic you have any questions or concerns. The clinic phone number is [phone].  Please show the [facility] at check-in to the Emergency Department and triage nurse.

## 2017-01-12 ENCOUNTER — Ambulatory Visit
Admission: RE | Admit: 2017-01-12 | Discharge: 2017-01-12 | Disposition: A | Discharge status: Home / Self Care | Payer: BLUE CROSS/BLUE SHIELD | Source: Ambulatory Visit | Attending: Radiation Oncology | Admitting: Radiation Oncology

## 2017-01-12 ENCOUNTER — Ambulatory Visit (HOSPITAL_BASED_OUTPATIENT_CLINIC_OR_DEPARTMENT_OTHER): Payer: BLUE CROSS/BLUE SHIELD

## 2017-01-12 VITALS — BP 109/75 | HR 103 | Temp 98.2°F | Resp 18

## 2017-01-12 DIAGNOSIS — C3491 Malignant neoplasm of unspecified part of right bronchus or lung: Secondary | ICD-10-CM

## 2017-01-12 DIAGNOSIS — C3411 Malignant neoplasm of upper lobe, right bronchus or lung: Secondary | ICD-10-CM | POA: Diagnosis not present

## 2017-01-12 DIAGNOSIS — Z5111 Encounter for antineoplastic chemotherapy: Secondary | ICD-10-CM

## 2017-01-12 DIAGNOSIS — Z510 Encounter for antineoplastic radiation therapy: Secondary | ICD-10-CM | POA: Diagnosis not present

## 2017-01-12 MED ORDER — SODIUM CHLORIDE 0.9 % IV SOLN
120.0000 mg/m2 | Freq: Once | INTRAVENOUS | Status: AC
  Administered 2017-01-12: 250 mg via INTRAVENOUS
  Filled 2017-01-12: qty 12.5

## 2017-01-12 MED ORDER — DEXAMETHASONE SODIUM PHOSPHATE 10 MG/ML IJ SOLN
10.0000 mg | Freq: Once | INTRAMUSCULAR | Status: AC
  Administered 2017-01-12: 10 mg via INTRAVENOUS

## 2017-01-12 MED ORDER — DEXAMETHASONE SODIUM PHOSPHATE 10 MG/ML IJ SOLN
INTRAMUSCULAR | Status: AC
  Filled 2017-01-12: qty 1

## 2017-01-12 MED ORDER — SODIUM CHLORIDE 0.9 % IV SOLN
Freq: Once | INTRAVENOUS | Status: AC
  Administered 2017-01-12: 10:00:00 via INTRAVENOUS

## 2017-01-12 NOTE — Patient Instructions (Signed)
[facility] Discharge Instructions for Patients Receiving Chemotherapy  Today you received the following chemotherapy agents Etoposide.   To help prevent nausea and vomiting after your treatment, we encourage you to take your nausea medication as prescribed.   If you develop nausea and vomiting that is not controlled by your nausea medication, call the clinic.   BELOW ARE SYMPTOMS THAT SHOULD BE REPORTED IMMEDIATELY:  *FEVER GREATER THAN 100.5 F  *CHILLS WITH OR WITHOUT FEVER  NAUSEA AND VOMITING THAT IS NOT CONTROLLED WITH YOUR NAUSEA MEDICATION  *UNUSUAL SHORTNESS OF BREATH  *UNUSUAL BRUISING OR BLEEDING  TENDERNESS IN MOUTH AND THROAT WITH OR WITHOUT PRESENCE OF ULCERS  *URINARY PROBLEMS  *BOWEL PROBLEMS  UNUSUAL RASH Items with * indicate a potential emergency and should be followed up as soon as possible.  Feel free to call the clinic you have any questions or concerns. The clinic phone number is [phone].  Please show the CHEMO ALERT CARD at check-in to the Emergency Department and triage nurse.   

## 2017-01-13 ENCOUNTER — Ambulatory Visit
Admission: RE | Admit: 2017-01-13 | Discharge: 2017-01-13 | Disposition: A | Discharge status: Home / Self Care | Payer: BLUE CROSS/BLUE SHIELD | Source: Ambulatory Visit | Attending: Radiation Oncology | Admitting: Radiation Oncology

## 2017-01-13 ENCOUNTER — Ambulatory Visit (HOSPITAL_BASED_OUTPATIENT_CLINIC_OR_DEPARTMENT_OTHER): Payer: BLUE CROSS/BLUE SHIELD

## 2017-01-13 VITALS — BP 131/85 | HR 83 | Resp 18 | Wt 206.0 lb

## 2017-01-13 VITALS — BP 152/94 | HR 85 | Temp 98.1°F | Resp 17

## 2017-01-13 DIAGNOSIS — C3411 Malignant neoplasm of upper lobe, right bronchus or lung: Secondary | ICD-10-CM | POA: Diagnosis not present

## 2017-01-13 DIAGNOSIS — Z5111 Encounter for antineoplastic chemotherapy: Secondary | ICD-10-CM

## 2017-01-13 DIAGNOSIS — Z510 Encounter for antineoplastic radiation therapy: Secondary | ICD-10-CM | POA: Diagnosis not present

## 2017-01-13 DIAGNOSIS — C3491 Malignant neoplasm of unspecified part of right bronchus or lung: Secondary | ICD-10-CM

## 2017-01-13 MED ORDER — DEXAMETHASONE SODIUM PHOSPHATE 10 MG/ML IJ SOLN
10.0000 mg | Freq: Once | INTRAMUSCULAR | Status: AC
  Administered 2017-01-13: 10 mg via INTRAVENOUS

## 2017-01-13 MED ORDER — PEGFILGRASTIM 6 MG/0.6ML ~~LOC~~ PSKT
6.0000 mg | PREFILLED_SYRINGE | Freq: Once | SUBCUTANEOUS | Status: AC
  Administered 2017-01-13: 6 mg via SUBCUTANEOUS
  Filled 2017-01-13: qty 0.6

## 2017-01-13 MED ORDER — DEXAMETHASONE SODIUM PHOSPHATE 10 MG/ML IJ SOLN
INTRAMUSCULAR | Status: AC
  Filled 2017-01-13: qty 1

## 2017-01-13 MED ORDER — SODIUM CHLORIDE 0.9 % IV SOLN
120.0000 mg/m2 | Freq: Once | INTRAVENOUS | Status: AC
  Administered 2017-01-13: 250 mg via INTRAVENOUS
  Filled 2017-01-13: qty 12.5

## 2017-01-13 MED ORDER — SODIUM CHLORIDE 0.9 % IV SOLN
Freq: Once | INTRAVENOUS | Status: AC
  Administered 2017-01-13: 10:00:00 via INTRAVENOUS

## 2017-01-13 NOTE — Patient Instructions (Signed)
[facility] Discharge Instructions for Patients Receiving Chemotherapy  Today you received the following chemotherapy agents Etoposide.   To help prevent nausea and vomiting after your treatment, we encourage you to take your nausea medication as prescribed.   If you develop nausea and vomiting that is not controlled by your nausea medication, call the clinic.   BELOW ARE SYMPTOMS THAT SHOULD BE REPORTED IMMEDIATELY:  *FEVER GREATER THAN 100.5 F  *CHILLS WITH OR WITHOUT FEVER  NAUSEA AND VOMITING THAT IS NOT CONTROLLED WITH YOUR NAUSEA MEDICATION  *UNUSUAL SHORTNESS OF BREATH  *UNUSUAL BRUISING OR BLEEDING  TENDERNESS IN MOUTH AND THROAT WITH OR WITHOUT PRESENCE OF ULCERS  *URINARY PROBLEMS  *BOWEL PROBLEMS  UNUSUAL RASH Items with * indicate a potential emergency and should be followed up as soon as possible.  Feel free to call the clinic you have any questions or concerns. The clinic phone number is [phone].  Please show the CHEMO ALERT CARD at check-in to the Emergency Department and triage nurse.   

## 2017-01-13 NOTE — Progress Notes (Signed)
Weight and vitals stable. Reports intermittent sharp burning chest wall pain is less. Reports continued shortness of breath and wheezing. Reports productive cough is more frequent and intense with white sputum. Denies pain or discomfort associated with swallowing. Reports dysphagia while swallowing pills is unchanged. Denies skin changes within treatment field. Continues to use radiaplex bid as directed. Reports mild fatigue.   BP 131/85 (BP Location: Right Arm, Patient Position: Sitting, Cuff Size: Normal)   Pulse 83   Resp 18   Wt 206 lb (93.4 kg)   SpO2 99%   BMI 33.25 kg/m  Wt Readings from Last 3 Encounters:  01/13/17 206 lb (93.4 kg)  01/12/17 206 lb (93.4 kg)  01/11/17 201 lb 3.2 oz (91.3 kg)

## 2017-01-13 NOTE — Progress Notes (Signed)
  Radiation Oncology         [phone]   Name: Tammy Bowers MRN: [id number]   Date: 01/13/2017  DOB: [birth year]    Weekly Radiation Therapy Management    ICD-9-CM ICD-10-CM   1. Small cell lung carcinoma, right (HCC) 162.9 C34.91     Current Dose: 37.8 Gy  Planned Dose:  59.4 Gy  Narrative The patient presents for routine under treatment assessment.  Reports intermittent sharp burning chest wall pain is less, continued shortness of breath and wheezing, a productive cough that is more frequent and intense with white sputum, and dysphagia while swallowing pills is unchanged. She denies hemoptysis. She denies pain or discomfort associated with swallowing or skin changes within the treatment field. She continues to use radiaplex bid as directed. Reports mild fatigue. The patient completed her 4th cycle of chemotherapy today. She reports occasional chills.  Set-up films were reviewed. The chart was checked.  Physical Findings  weight is 206 lb (93.4 kg). Her blood pressure is 131/85 and her pulse is 83. Her respiration is 18 and oxygen saturation is 99%. . Weight essentially stable. Alert and oriented x3. In no respiratory distress.  Impression The patient is tolerating radiation. The right hilar mass appears to have decreased in size and her collapsed lung has opened up on her recent treatment imaging.  Plan Continue treatment as planned. The patient is scheduled for a CT of the chest w/ contrast on 01/25/17. We discussed prophylactic brain radiation after the patient completes chemotherapy.      Sheral Apley Tammi Klippel, M.D.  This document serves as a record of services personally performed by [name], MD. It was created on his behalf by Darcus Austin, a trained medical scribe. The creation of this record is based on the scribe's personal observations and the provider's statements to them. This document has been checked and approved by the attending provider.

## 2017-01-16 ENCOUNTER — Ambulatory Visit
Admission: RE | Admit: 2017-01-16 | Discharge: 2017-01-16 | Disposition: A | Discharge status: Home / Self Care | Payer: BLUE CROSS/BLUE SHIELD | Source: Ambulatory Visit | Attending: Radiation Oncology | Admitting: Radiation Oncology

## 2017-01-16 DIAGNOSIS — Z510 Encounter for antineoplastic radiation therapy: Secondary | ICD-10-CM | POA: Diagnosis not present

## 2017-01-17 ENCOUNTER — Ambulatory Visit
Admission: RE | Admit: 2017-01-17 | Discharge: 2017-01-17 | Disposition: A | Discharge status: Home / Self Care | Payer: BLUE CROSS/BLUE SHIELD | Source: Ambulatory Visit | Attending: Radiation Oncology | Admitting: Radiation Oncology

## 2017-01-17 DIAGNOSIS — Z510 Encounter for antineoplastic radiation therapy: Secondary | ICD-10-CM | POA: Diagnosis not present

## 2017-01-18 ENCOUNTER — Ambulatory Visit
Admission: RE | Admit: 2017-01-18 | Discharge: 2017-01-18 | Disposition: A | Discharge status: Home / Self Care | Payer: BLUE CROSS/BLUE SHIELD | Source: Ambulatory Visit | Attending: Radiation Oncology | Admitting: Radiation Oncology

## 2017-01-18 ENCOUNTER — Other Ambulatory Visit: Payer: BLUE CROSS/BLUE SHIELD

## 2017-01-18 DIAGNOSIS — Z510 Encounter for antineoplastic radiation therapy: Secondary | ICD-10-CM | POA: Diagnosis not present

## 2017-01-19 ENCOUNTER — Ambulatory Visit
Admission: RE | Admit: 2017-01-19 | Discharge: 2017-01-19 | Disposition: A | Discharge status: Home / Self Care | Payer: BLUE CROSS/BLUE SHIELD | Source: Ambulatory Visit | Attending: Radiation Oncology | Admitting: Radiation Oncology

## 2017-01-19 DIAGNOSIS — Z510 Encounter for antineoplastic radiation therapy: Secondary | ICD-10-CM | POA: Diagnosis not present

## 2017-01-20 ENCOUNTER — Ambulatory Visit
Admission: RE | Admit: 2017-01-20 | Discharge: 2017-01-20 | Disposition: A | Discharge status: Home / Self Care | Payer: BLUE CROSS/BLUE SHIELD | Source: Ambulatory Visit | Attending: Radiation Oncology | Admitting: Radiation Oncology

## 2017-01-20 VITALS — BP 96/78 | HR 94 | Temp 98.3°F | Resp 20 | Wt 197.0 lb

## 2017-01-20 DIAGNOSIS — C3491 Malignant neoplasm of unspecified part of right bronchus or lung: Secondary | ICD-10-CM

## 2017-01-20 DIAGNOSIS — Z510 Encounter for antineoplastic radiation therapy: Secondary | ICD-10-CM | POA: Diagnosis not present

## 2017-01-20 NOTE — Progress Notes (Signed)
Vitals stable. Reports intermittent sharp burning chest pain happens less frequency. Reports continued shortness of breath and wheezing. Reports productive cough is more frequent and starting to interfere with treatment. Reports sputum from cough continues to be white. Reports new onset difficulty swallowing. Requesting medication to relieve painful swallowing. Denies skin changes within treatment field. Reports using radiaplex bid as directed. Reports fatigue.   BP 96/78 (BP Location: Right Arm, Patient Position: Sitting, Cuff Size: Normal)   Pulse 94   Temp 98.3 F (36.8 C) (Oral)   Resp 20   Wt 197 lb (89.4 kg)   SpO2 96%   BMI 31.80 kg/m  Wt Readings from Last 3 Encounters:  01/20/17 197 lb (89.4 kg)  01/13/17 206 lb (93.4 kg)  01/12/17 206 lb (93.4 kg)

## 2017-01-20 NOTE — Progress Notes (Signed)
  Radiation Oncology         [phone]   Name: Tammy Bowers MRN: [id number]   Date: 01/20/2017  DOB: January 25, 1951    Weekly Radiation Therapy Management    ICD-9-CM ICD-10-CM   1. Small cell lung carcinoma, right (HCC) 162.9 C34.91     Current Dose: 46.8 Gy  Planned Dose:  59.4 Gy  Narrative The patient presents for routine under treatment assessment.  Vitals stable. Reports intermittent sharp burning chest pain happens less frequently. Reports continued SOB and wheezing. Reports productive cough is more frequent and starting to interfere with treatment. Reports sputum from cough continues to be white. Reports new onset difficulty swallowing. Requesting medication to relieve painful swallowing. Denies skin changes within treatment field. Reports using radiaplex bid as directed. Reports fatigue.  Set-up films were reviewed. The chart was checked.  Physical Findings  weight is 197 lb (89.4 kg). Her oral temperature is 98.3 F (36.8 C). Her blood pressure is 96/78 and her pulse is 94. Her respiration is 20 and oxygen saturation is 96%. . Weight essentially stable. Alert and oriented x3.   Impression The patient is tolerating radiation.   Plan Continue treatment as planned. Encouraged the patient to take hydrocodone twice daily to help with cough and discomfort with swallowing.  The patient is scheduled for CT Chest on 01/25/2017. We will review these scans next week.      Sheral Apley Tammi Klippel, M.D.  This document serves as a record of services personally performed by [name], MD. It was created on his behalf by Arlyce Harman, a trained medical scribe. The creation of this record is based on the scribe's personal observations and the provider's statements to them. This document has been checked and approved by the attending provider.

## 2017-01-23 ENCOUNTER — Telehealth: Payer: Self-pay | Admitting: Internal Medicine

## 2017-01-23 ENCOUNTER — Ambulatory Visit
Admission: RE | Admit: 2017-01-23 | Discharge: 2017-01-23 | Disposition: A | Discharge status: Home / Self Care | Payer: BLUE CROSS/BLUE SHIELD | Source: Ambulatory Visit | Attending: Radiation Oncology | Admitting: Radiation Oncology

## 2017-01-23 DIAGNOSIS — Z510 Encounter for antineoplastic radiation therapy: Secondary | ICD-10-CM | POA: Diagnosis not present

## 2017-01-23 NOTE — Telephone Encounter (Signed)
Spoke with the patient and confirmed all appointments

## 2017-01-24 ENCOUNTER — Ambulatory Visit
Admission: RE | Admit: 2017-01-24 | Discharge: 2017-01-24 | Disposition: A | Discharge status: Home / Self Care | Payer: BLUE CROSS/BLUE SHIELD | Source: Ambulatory Visit | Attending: Radiation Oncology | Admitting: Radiation Oncology

## 2017-01-24 ENCOUNTER — Other Ambulatory Visit: Payer: Self-pay | Admitting: Internal Medicine

## 2017-01-24 DIAGNOSIS — Z510 Encounter for antineoplastic radiation therapy: Secondary | ICD-10-CM | POA: Diagnosis not present

## 2017-01-25 ENCOUNTER — Other Ambulatory Visit (HOSPITAL_BASED_OUTPATIENT_CLINIC_OR_DEPARTMENT_OTHER): Payer: BLUE CROSS/BLUE SHIELD

## 2017-01-25 ENCOUNTER — Ambulatory Visit (HOSPITAL_COMMUNITY)
Admission: RE | Admit: 2017-01-25 | Discharge: 2017-01-25 | Disposition: A | Discharge status: Home / Self Care | Payer: BLUE CROSS/BLUE SHIELD | Source: Ambulatory Visit | Attending: Internal Medicine | Admitting: Internal Medicine

## 2017-01-25 ENCOUNTER — Ambulatory Visit
Admission: RE | Admit: 2017-01-25 | Discharge: 2017-01-25 | Disposition: A | Discharge status: Home / Self Care | Payer: BLUE CROSS/BLUE SHIELD | Source: Ambulatory Visit | Attending: Radiation Oncology | Admitting: Radiation Oncology

## 2017-01-25 DIAGNOSIS — Z5111 Encounter for antineoplastic chemotherapy: Secondary | ICD-10-CM | POA: Diagnosis not present

## 2017-01-25 DIAGNOSIS — C3411 Malignant neoplasm of upper lobe, right bronchus or lung: Secondary | ICD-10-CM

## 2017-01-25 DIAGNOSIS — Z510 Encounter for antineoplastic radiation therapy: Secondary | ICD-10-CM | POA: Diagnosis not present

## 2017-01-25 DIAGNOSIS — I2510 Atherosclerotic heart disease of native coronary artery without angina pectoris: Secondary | ICD-10-CM | POA: Insufficient documentation

## 2017-01-25 DIAGNOSIS — D3501 Benign neoplasm of right adrenal gland: Secondary | ICD-10-CM | POA: Insufficient documentation

## 2017-01-25 DIAGNOSIS — C3491 Malignant neoplasm of unspecified part of right bronchus or lung: Secondary | ICD-10-CM

## 2017-01-25 LAB — CBC WITH DIFFERENTIAL/PLATELET
BASO%: 0.5 % (ref 0.0–2.0)
Basophils Absolute: 0 10*3/uL (ref 0.0–0.1)
EOS%: 0.6 % (ref 0.0–7.0)
Eosinophils Absolute: 0 10*3/uL (ref 0.0–0.5)
HCT: 29.9 % — ABNORMAL LOW (ref 34.8–46.6)
HGB: 9.8 g/dL — ABNORMAL LOW (ref 11.6–15.9)
LYMPH%: 9.5 % — ABNORMAL LOW (ref 14.0–49.7)
MCH: 29.8 pg (ref 25.1–34.0)
MCHC: 32.8 g/dL (ref 31.5–36.0)
MCV: 90.9 fL (ref 79.5–101.0)
MONO#: 0.5 10*3/uL (ref 0.1–0.9)
MONO%: 6.9 % (ref 0.0–14.0)
NEUT#: 5.4 10*3/uL (ref 1.5–6.5)
NEUT%: 82.5 % — ABNORMAL HIGH (ref 38.4–76.8)
Platelets: 98 10*3/uL — ABNORMAL LOW (ref 145–400)
RBC: 3.29 10*6/uL — ABNORMAL LOW (ref 3.70–5.45)
RDW: 18 % — ABNORMAL HIGH (ref 11.2–14.5)
WBC: 6.5 10*3/uL (ref 3.9–10.3)
lymph#: 0.6 10*3/uL — ABNORMAL LOW (ref 0.9–3.3)

## 2017-01-25 LAB — COMPREHENSIVE METABOLIC PANEL
ALT: 14 U/L (ref 0–55)
AST: 16 U/L (ref 5–34)
Albumin: 3.7 g/dL (ref 3.5–5.0)
Alkaline Phosphatase: 117 U/L (ref 40–150)
Anion Gap: 11 mEq/L (ref 3–11)
BUN: 23.2 mg/dL (ref 7.0–26.0)
CO2: 27 mEq/L (ref 22–29)
Calcium: 9.4 mg/dL (ref 8.4–10.4)
Chloride: 104 mEq/L (ref 98–109)
Creatinine: 1 mg/dL (ref 0.6–1.1)
EGFR: 59 mL/min/{1.73_m2} — ABNORMAL LOW (ref >90)
Glucose: 99 mg/dl (ref 70–140)
Potassium: 3.6 mEq/L (ref 3.5–5.1)
Sodium: 142 mEq/L (ref 136–145)
Total Bilirubin: <0.22 mg/dL (ref 0.20–1.20)
Total Protein: 6.6 g/dL (ref 6.4–8.3)

## 2017-01-25 LAB — MAGNESIUM: Magnesium: 1.7 mg/dl (ref 1.5–2.5)

## 2017-01-25 MED ORDER — IOPAMIDOL (ISOVUE-300) INJECTION 61%
75.0000 mL | Freq: Once | INTRAVENOUS | Status: AC | PRN
  Administered 2017-01-25: 75 mL via INTRAVENOUS

## 2017-01-26 ENCOUNTER — Ambulatory Visit
Admission: RE | Admit: 2017-01-26 | Payer: BLUE CROSS/BLUE SHIELD | Source: Ambulatory Visit | Admitting: Radiation Oncology

## 2017-01-26 ENCOUNTER — Ambulatory Visit: Payer: BLUE CROSS/BLUE SHIELD

## 2017-01-27 ENCOUNTER — Ambulatory Visit
Admission: RE | Admit: 2017-01-27 | Discharge: 2017-01-27 | Disposition: A | Discharge status: Home / Self Care | Payer: BLUE CROSS/BLUE SHIELD | Source: Ambulatory Visit | Attending: Radiation Oncology | Admitting: Radiation Oncology

## 2017-01-27 ENCOUNTER — Ambulatory Visit: Payer: BLUE CROSS/BLUE SHIELD

## 2017-01-27 DIAGNOSIS — Z510 Encounter for antineoplastic radiation therapy: Secondary | ICD-10-CM | POA: Diagnosis not present

## 2017-01-30 ENCOUNTER — Telehealth: Payer: Self-pay | Admitting: Internal Medicine

## 2017-01-30 ENCOUNTER — Ambulatory Visit
Admission: RE | Admit: 2017-01-30 | Discharge: 2017-01-30 | Disposition: A | Discharge status: Home / Self Care | Payer: BLUE CROSS/BLUE SHIELD | Source: Ambulatory Visit | Attending: Radiation Oncology | Admitting: Radiation Oncology

## 2017-01-30 ENCOUNTER — Telehealth: Payer: Self-pay | Admitting: Medical Oncology

## 2017-01-30 ENCOUNTER — Encounter: Payer: Self-pay | Admitting: Radiation Oncology

## 2017-01-30 ENCOUNTER — Ambulatory Visit: Payer: BLUE CROSS/BLUE SHIELD

## 2017-01-30 VITALS — BP 118/97 | HR 102 | Temp 98.7°F | Resp 12 | Wt 199.0 lb

## 2017-01-30 DIAGNOSIS — Z510 Encounter for antineoplastic radiation therapy: Secondary | ICD-10-CM | POA: Diagnosis not present

## 2017-01-30 DIAGNOSIS — C3491 Malignant neoplasm of unspecified part of right bronchus or lung: Secondary | ICD-10-CM

## 2017-01-30 NOTE — Progress Notes (Signed)
  Radiation Oncology         [phone]   Name: Tammy Bowers MRN: [id number]   Date: 01/30/2017  DOB: 02-06-1951    Weekly Radiation Therapy Management    ICD-9-CM ICD-10-CM   1. Small cell lung carcinoma, right (HCC) 162.9 C34.91     Current Dose: 55.8 Gy  Planned Dose:  59.4 Gy  Narrative The patient presents for routine under treatment assessment.  She reports intermittent and aching over chest, rates her pain as a 5 on a scale of 0-10. She reports coughing, productive with white foam. She also reports shortness of breath and wheezing.   Set-up films were reviewed. The chart was checked.  Physical Findings  weight is 199 lb (90.3 kg). Her oral temperature is 98.7 F (37.1 C). Her blood pressure is 118/97 (abnormal) and her pulse is 102 (abnormal). Her respiration is 12 and oxygen saturation is 100%. . Weight essentially stable. Alert and oriented x3.   Impression The patient is tolerating radiation.  CT chest this past week looks like good response with no new lesions  Plan Continue treatment as planned. Follow-up one month after completion.  Will consider PCI when systemic treatment completed on referral from Dr. Julien Nordmann.      Sheral Apley Tammi Klippel, M.D.  This document serves as a record of services personally performed by [name], MD. It was created on his behalf by Arlyce Harman, a trained medical scribe. The creation of this record is based on the scribe's personal observations and the provider's statements to them. This document has been checked and approved by the attending provider.

## 2017-01-30 NOTE — Telephone Encounter (Signed)
Death in family -needs to cancel appts this week and r/s to next week. Scheduling notified and request sent.

## 2017-01-30 NOTE — Telephone Encounter (Signed)
Returned call to pt to confirm r/s appts to 2/27 due to pt being out of town for funeral. Pt has new appt date/times

## 2017-01-30 NOTE — Progress Notes (Signed)
PAIN: She rates her pain as a 5 on a scale of 0-10. intermittent and aching over chest. RESPIRATORY: Coughing  Productive-white foam, SOB, wheezing. Pt is on room air.  BP (!) 118/97   Pulse (!) 102   Temp 98.7 F (37.1 C) (Oral)   Resp 12   Wt 199 lb (90.3 kg)   SpO2 100%   BMI 32.12 kg/m  Wt Readings from Last 3 Encounters:  01/30/17 199 lb (90.3 kg)  01/20/17 197 lb (89.4 kg)  01/13/17 206 lb (93.4 kg)

## 2017-01-31 ENCOUNTER — Ambulatory Visit
Admission: RE | Admit: 2017-01-31 | Discharge: 2017-01-31 | Disposition: A | Discharge status: Home / Self Care | Payer: BLUE CROSS/BLUE SHIELD | Source: Ambulatory Visit | Attending: Radiation Oncology | Admitting: Radiation Oncology

## 2017-01-31 ENCOUNTER — Encounter: Payer: Self-pay | Admitting: Radiation Oncology

## 2017-01-31 ENCOUNTER — Ambulatory Visit: Payer: BLUE CROSS/BLUE SHIELD

## 2017-01-31 DIAGNOSIS — Z510 Encounter for antineoplastic radiation therapy: Secondary | ICD-10-CM | POA: Diagnosis not present

## 2017-02-01 ENCOUNTER — Ambulatory Visit: Payer: BLUE CROSS/BLUE SHIELD

## 2017-02-01 ENCOUNTER — Other Ambulatory Visit: Payer: BLUE CROSS/BLUE SHIELD

## 2017-02-01 ENCOUNTER — Ambulatory Visit: Payer: BLUE CROSS/BLUE SHIELD | Admitting: Internal Medicine

## 2017-02-01 NOTE — Progress Notes (Signed)
  Radiation Oncology         [phone] ________________________________  Name: Tammy Bowers MRN: [id number]  Date: 01/31/2017  DOB: 09/23/1951  End of Treatment Note  Diagnosis:   66 y.o.female with limited stage small cell carcinoma of the right upper lobe invading the right hilum and mediastinum.     Indication for treatment:  Curative       Radiation treatment dates:   12/13/2016 to 01/31/2017  Site/dose:   The Right lung SCLC was treated to 59.4 Gy in 33 fractions at 1.8 Gy per fraction.   Beams/energy:   3D // 10X, 6X  Narrative: The patient tolerated radiation treatment relatively well.   She reports intermittent and aching over chest, rates her pain as a 5 on a scale of 0-10. She reports coughing, productive with white foam. She also reports shortness of breath and wheezing.  Plan: The patient has completed radiation treatment. The patient will return to radiation oncology clinic for routine followup in one month. I advised her to call or return sooner if she has any questions or concerns related to her recovery or treatment.  We will plan to discuss PCI later, and she understands that new 3T brain MRI with contrast will be needed for re-staging prior to PCI ________________________________  Sheral Apley. Tammi Klippel, M.D.  This document serves as a record of services personally performed by [name], MD. It was created on his behalf by Arlyce Harman, a trained medical scribe. The creation of this record is based on the scribe's personal observations and the provider's statements to them. This document has been checked and approved by the attending provider.

## 2017-02-02 ENCOUNTER — Ambulatory Visit: Payer: BLUE CROSS/BLUE SHIELD

## 2017-02-03 ENCOUNTER — Ambulatory Visit: Payer: BLUE CROSS/BLUE SHIELD

## 2017-02-06 ENCOUNTER — Ambulatory Visit: Payer: BLUE CROSS/BLUE SHIELD | Admitting: Internal Medicine

## 2017-02-06 ENCOUNTER — Ambulatory Visit: Payer: BLUE CROSS/BLUE SHIELD

## 2017-02-06 ENCOUNTER — Other Ambulatory Visit: Payer: BLUE CROSS/BLUE SHIELD

## 2017-02-07 ENCOUNTER — Ambulatory Visit: Payer: BLUE CROSS/BLUE SHIELD

## 2017-02-08 ENCOUNTER — Ambulatory Visit: Payer: BLUE CROSS/BLUE SHIELD

## 2017-02-08 ENCOUNTER — Other Ambulatory Visit: Payer: BLUE CROSS/BLUE SHIELD

## 2017-02-09 ENCOUNTER — Ambulatory Visit: Payer: BLUE CROSS/BLUE SHIELD

## 2017-02-10 ENCOUNTER — Ambulatory Visit: Payer: BLUE CROSS/BLUE SHIELD

## 2017-02-14 ENCOUNTER — Encounter: Payer: Self-pay | Admitting: Internal Medicine

## 2017-02-14 ENCOUNTER — Ambulatory Visit (HOSPITAL_BASED_OUTPATIENT_CLINIC_OR_DEPARTMENT_OTHER): Payer: BLUE CROSS/BLUE SHIELD

## 2017-02-14 ENCOUNTER — Other Ambulatory Visit (HOSPITAL_BASED_OUTPATIENT_CLINIC_OR_DEPARTMENT_OTHER): Payer: BLUE CROSS/BLUE SHIELD

## 2017-02-14 ENCOUNTER — Ambulatory Visit (HOSPITAL_BASED_OUTPATIENT_CLINIC_OR_DEPARTMENT_OTHER): Payer: BLUE CROSS/BLUE SHIELD | Admitting: Internal Medicine

## 2017-02-14 VITALS — BP 147/87 | HR 105

## 2017-02-14 VITALS — BP 136/98 | HR 116 | Temp 98.2°F | Resp 18 | Ht 66.0 in | Wt 193.9 lb

## 2017-02-14 DIAGNOSIS — C3491 Malignant neoplasm of unspecified part of right bronchus or lung: Secondary | ICD-10-CM

## 2017-02-14 DIAGNOSIS — C3411 Malignant neoplasm of upper lobe, right bronchus or lung: Secondary | ICD-10-CM

## 2017-02-14 DIAGNOSIS — J449 Chronic obstructive pulmonary disease, unspecified: Secondary | ICD-10-CM | POA: Diagnosis not present

## 2017-02-14 DIAGNOSIS — Z5111 Encounter for antineoplastic chemotherapy: Secondary | ICD-10-CM

## 2017-02-14 LAB — COMPREHENSIVE METABOLIC PANEL
ALT: 12 U/L (ref 0–55)
AST: 17 U/L (ref 5–34)
Albumin: 3.8 g/dL (ref 3.5–5.0)
Alkaline Phosphatase: 96 U/L (ref 40–150)
Anion Gap: 9 mEq/L (ref 3–11)
BUN: 19.6 mg/dL (ref 7.0–26.0)
CO2: 25 mEq/L (ref 22–29)
Calcium: 9.9 mg/dL (ref 8.4–10.4)
Chloride: 106 mEq/L (ref 98–109)
Creatinine: 1.1 mg/dL (ref 0.6–1.1)
EGFR: 51 mL/min/{1.73_m2} — ABNORMAL LOW (ref >90)
Glucose: 106 mg/dl (ref 70–140)
Potassium: 4.3 mEq/L (ref 3.5–5.1)
Sodium: 140 mEq/L (ref 136–145)
Total Bilirubin: 0.38 mg/dL (ref 0.20–1.20)
Total Protein: 6.9 g/dL (ref 6.4–8.3)

## 2017-02-14 LAB — CBC WITH DIFFERENTIAL/PLATELET
BASO%: 2.6 % — ABNORMAL HIGH (ref 0.0–2.0)
Basophils Absolute: 0.1 10*3/uL (ref 0.0–0.1)
EOS%: 3.1 % (ref 0.0–7.0)
Eosinophils Absolute: 0.2 10*3/uL (ref 0.0–0.5)
HCT: 33.9 % — ABNORMAL LOW (ref 34.8–46.6)
HGB: 11.9 g/dL (ref 11.6–15.9)
LYMPH%: 14.4 % (ref 14.0–49.7)
MCH: 32.2 pg (ref 25.1–34.0)
MCHC: 35 g/dL (ref 31.5–36.0)
MCV: 92.1 fL (ref 79.5–101.0)
MONO#: 0.6 10*3/uL (ref 0.1–0.9)
MONO%: 10.7 % (ref 0.0–14.0)
NEUT#: 3.9 10*3/uL (ref 1.5–6.5)
NEUT%: 69.2 % (ref 38.4–76.8)
Platelets: 279 10*3/uL (ref 145–400)
RBC: 3.68 10*6/uL — ABNORMAL LOW (ref 3.70–5.45)
RDW: 19 % — ABNORMAL HIGH (ref 11.2–14.5)
WBC: 5.6 10*3/uL (ref 3.9–10.3)
lymph#: 0.8 10*3/uL — ABNORMAL LOW (ref 0.9–3.3)

## 2017-02-14 LAB — MAGNESIUM: Magnesium: 1.9 mg/dl (ref 1.5–2.5)

## 2017-02-14 MED ORDER — SODIUM CHLORIDE 0.9 % IV SOLN
Freq: Once | INTRAVENOUS | Status: AC
  Administered 2017-02-14: 13:00:00 via INTRAVENOUS
  Filled 2017-02-14: qty 5

## 2017-02-14 MED ORDER — ETOPOSIDE CHEMO INJECTION 1 GM/50ML
120.0000 mg/m2 | Freq: Once | INTRAVENOUS | Status: AC
  Administered 2017-02-14: 250 mg via INTRAVENOUS
  Filled 2017-02-14: qty 12.5

## 2017-02-14 MED ORDER — SODIUM CHLORIDE 0.9% FLUSH
10.0000 mL | INTRAVENOUS | Status: DC | PRN
  Filled 2017-02-14: qty 10

## 2017-02-14 MED ORDER — PALONOSETRON HCL INJECTION 0.25 MG/5ML
0.2500 mg | Freq: Once | INTRAVENOUS | Status: AC
  Administered 2017-02-14: 0.25 mg via INTRAVENOUS

## 2017-02-14 MED ORDER — SODIUM CHLORIDE 0.9 % IV SOLN
Freq: Once | INTRAVENOUS | Status: AC
  Administered 2017-02-14: 10:00:00 via INTRAVENOUS

## 2017-02-14 MED ORDER — HEPARIN SOD (PORK) LOCK FLUSH 100 UNIT/ML IV SOLN
500.0000 [IU] | Freq: Once | INTRAVENOUS | Status: DC | PRN
  Filled 2017-02-14: qty 5

## 2017-02-14 MED ORDER — POTASSIUM CHLORIDE 2 MEQ/ML IV SOLN
Freq: Once | INTRAVENOUS | Status: AC
  Administered 2017-02-14: 10:00:00 via INTRAVENOUS
  Filled 2017-02-14: qty 10

## 2017-02-14 MED ORDER — PALONOSETRON HCL INJECTION 0.25 MG/5ML
INTRAVENOUS | Status: AC
  Filled 2017-02-14: qty 5

## 2017-02-14 MED ORDER — SODIUM CHLORIDE 0.9 % IV SOLN
60.0000 mg/m2 | Freq: Once | INTRAVENOUS | Status: AC
  Administered 2017-02-14: 127 mg via INTRAVENOUS
  Filled 2017-02-14: qty 127

## 2017-02-14 NOTE — Progress Notes (Signed)
Dr. Julien Nordmann okay to tx with HR of 116.

## 2017-02-14 NOTE — Progress Notes (Signed)
[facility] [phone]   [phone]  OFFICE PROGRESS NOTE  Lujean Amel, MD [address] [place] [address]  DIAGNOSIS: Limited stage (T2b, N2, M0) small cell lung cancer diagnosed in November 2017 and presented with large right hilar mass with mediastinal invasion and a right lower lobe pulmonary nodule.  PRIOR THERAPY: None  CURRENT THERAPY: Systemic chemotherapy with cisplatin 60 MG/M2 on day 1 and etoposide 120 MG/M2 on days 1, 2 and 3 concurrent with radiation status post 4 cycles.  INTERVAL HISTORY: Tammy Bowers 66 y.o. female came to the clinic today for follow-up visit accompanied by her husband. The patient is feeling fine today with no specific complaints except for fatigue and cough. She completed the course of concurrent radiotherapy. She lost few pounds since her last visit. She is a little bit anxious today and usually anxious before coming for the chemotherapy. Heart rate is elevated today. She denied having any fever or chills. She has no nausea, vomiting, diarrhea or constipation. She has no significant chest pain but has shortness of breath with exertion and 2 episodes of hemoptysis a week ago which resolved spontaneously. She is here today for evaluation before starting cycle #5.  MEDICAL HISTORY: Past Medical History:  Diagnosis Date  . Adenopathy 10/10/2016   PERICARINAL  . Anemia   . Arthritis   . Asthma   . Cancer ([place])   . COPD (chronic obstructive pulmonary disease) ([facility])   . Dyspnea   . Headache    migraines  . Hilar mass 10/10/2016   RIGHT  . History of kidney stones   . Insomnia   . Lung nodules 10/10/2016   RIGHT LOWER LOBE  . Mass of both adrenal glands ([facility]) 10/10/2016  . Mood swing ([facility])   . Pneumonia   . Substance abuse    TOBACCO  . UNSPECIFIED INFECTION OF BONE ANKLE AND FOOT 10/22/2009   Annotation: left ankle Qualifier: Diagnosis of  By: Patsy Baltimore RN, Denise      ALLERGIES:   is allergic to lidocaine.  MEDICATIONS:  Current Outpatient Prescriptions  Medication Sig Dispense Refill  . albuterol (PROVENTIL HFA;VENTOLIN HFA) 108 (90 BASE) MCG/ACT inhaler Inhale 2 puffs into the lungs every 4 (four) hours as needed for wheezing.    . Cyclobenzaprine HCl (FLEXERIL PO) Take 5 mg by mouth 3 (three) times daily as needed.    . Fluticasone-Salmeterol (ADVAIR) 500-50 MCG/DOSE AEPB Inhale 1 puff into the lungs 2 (two) times daily.    Marland Kitchen HYDROcodone-acetaminophen (NORCO/VICODIN) 5-325 MG tablet Take 1 tablet by mouth 2 (two) times daily as needed.    Marland Kitchen LORazepam (ATIVAN) 0.5 MG tablet Take 1 tablet (0.5 mg total) by mouth at bedtime. 20 tablet 0  . Misc Natural Products (GLUCOSAMINE CHOND COMPLEX/MSM PO) Take 1 tablet by mouth every other day.    . Multiple Vitamin (MULTIVITAMIN WITH MINERALS) TABS tablet Take 1 tablet by mouth every other day. Women's Multivitamin     . Omega-3 Fatty Acids (FISH OIL) 1000 MG CAPS Take 1,000 mg by mouth every other day.    Marland Kitchen OVER THE COUNTER MEDICATION Take 1 mL by mouth 2 (two) times daily. CBD Extract    . prochlorperazine (COMPAZINE) 10 MG tablet TAKE 1 TABLET BY MOUTH EVERY 6 HOURS AS NEEDED FOR NAUSEA AND VOMITING 30 tablet 0   No current facility-administered medications for this visit.     SURGICAL HISTORY:  Past Surgical History:  Procedure Laterality  Date  . ANKLE SURGERY    . APPENDECTOMY    . LUNG BIOPSY N/A 10/19/2016   Procedure: LUNG BIOPSY;  Surgeon: Grace Isaac, MD;  Location: [facility];  Service: Thoracic;  Laterality: N/A;  . TONSILLECTOMY    . VIDEO BRONCHOSCOPY WITH ENDOBRONCHIAL ULTRASOUND N/A 10/19/2016   Procedure: VIDEO BRONCHOSCOPY WITH ENDOBRONCHIAL ULTRASOUND;  Surgeon: Grace Isaac, MD;  Location: [facility];  Service: Thoracic;  Laterality: N/A;    REVIEW OF SYSTEMS:  Constitutional: positive for fatigue Eyes: negative Ears, nose, mouth, throat, and face: negative Respiratory: positive for cough and  dyspnea on exertion Cardiovascular: negative Gastrointestinal: negative Genitourinary:negative Integument/breast: negative Hematologic/lymphatic: negative Musculoskeletal:negative Neurological: negative Behavioral/Psych: negative Endocrine: negative Allergic/Immunologic: negative   PHYSICAL EXAMINATION: General appearance: alert, cooperative, fatigued and no distress Head: Normocephalic, without obvious abnormality, atraumatic Neck: no adenopathy, no JVD, supple, symmetrical, trachea midline and thyroid not enlarged, symmetric, no tenderness/mass/nodules Lymph nodes: Cervical, supraclavicular, and axillary nodes normal. Resp: wheezes bilaterally Back: symmetric, no curvature. ROM normal. No CVA tenderness. Cardio: Tachycardic GI: soft, non-tender; bowel sounds normal; no masses,  no organomegaly Extremities: extremities normal, atraumatic, no cyanosis or edema Neurologic: Alert and oriented X 3, normal strength and tone. Normal symmetric reflexes. Normal coordination and gait  ECOG PERFORMANCE STATUS: 1 - Symptomatic but completely ambulatory  Blood pressure (!) 136/98, pulse (!) 116, temperature 98.2 F (36.8 C), temperature source Oral, resp. rate 18, height '5\' 6"'$  (1.676 m), weight 193 lb 14.4 oz (88 kg).  LABORATORY DATA: Lab Results  Component Value Date   WBC 5.6 02/14/2017   HGB 11.9 02/14/2017   HCT 33.9 (L) 02/14/2017   MCV 92.1 02/14/2017   PLT 279 02/14/2017      Chemistry      Component Value Date/Time   NA 142 01/25/2017 0940   K 3.6 01/25/2017 0940   CL 106 11/21/2016 0427   CO2 27 01/25/2017 0940   BUN 23.2 01/25/2017 0940   CREATININE 1.0 01/25/2017 0940      Component Value Date/Time   CALCIUM 9.4 01/25/2017 0940   ALKPHOS 117 01/25/2017 0940   AST 16 01/25/2017 0940   ALT 14 01/25/2017 0940   BILITOT <0.22 01/25/2017 0940       RADIOGRAPHIC STUDIES: Ct Chest W Contrast  Result Date: 01/25/2017 CLINICAL DATA:  Right-sided lung cancer  diagnosed in October. Non Small cell primary. Ongoing chemotherapy and radiation therapy. EXAM: CT CHEST WITH CONTRAST TECHNIQUE: Multidetector CT imaging of the chest was performed during intravenous contrast administration. CONTRAST:  29m ISOVUE-300 IOPAMIDOL (ISOVUE-300) INJECTION 61% COMPARISON:  12/16/2016 and PET of 10/21/2016. FINDINGS: Cardiovascular: Aortic and branch vessel atherosclerosis. Tortuous thoracic aorta. Normal heart size, without pericardial effusion. Lad coronary artery atherosclerosis. No central pulmonary embolism, on this non-dedicated study. Mediastinum/Nodes: No supraclavicular adenopathy. Low right paratracheal node measures 1.6 x 1.3 cm on image 53/series 2. Similar 1.7 x 1.2 cm on the prior. Subcarinal node measures 8 mm on image 58/series 2 versus 10 mm on the prior. Soft tissue fullness about the right infrahilar and hilar region again identified. The component identified just posterior to the terminus of the bronchus intermedius measures on the order of 2.2 x 1.6 cm on image 68/series 2. Compare 2.5 x 1.7 cm on the prior. The component more cephalad, including surrounding the bronchus intermedius, measures 3.0 cm on image 61/series 2 versus 3.2 cm on the prior exam (when remeasured). On coronal image 81, measures 3.2 cm craniocaudal today versus 3.6 cm on the  prior. Lungs/Pleura: No pleural fluid. Significant mass-effect upon primarily the right lower lobe bronchus. Airways are patent. Moderate centrilobular emphysema. Improved right middle lobe aeration with decreased volume loss and atelectasis. Calcified right middle lobe granuloma. Posterior left upper lobe scarring. Upper Abdomen: Normal imaged portions of the liver, stomach, pancreas, gallbladder, left adrenal gland, and right kidney. Interpolar left renal lesion is too small to characterize. Right adrenal lesion measures 3.5 x 2.8 cm and is low-density back on 10/21/2016, most consistent with an adenoma. Musculoskeletal: No  acute osseous abnormality. Convex right thoracic spine curvature. IMPRESSION: 1. Slight improvement in residual soft tissue fullness centered about the right hilum and infrahilar region. 2. Mediastinal nodes are similar to slightly improved. 3. No new sites of disease identified. 4. Age advanced coronary artery atherosclerosis. Recommend assessment of coronary risk factors and consideration of medical therapy. 5. Right adrenal adenoma. 6. Improved right middle lobe aeration. Electronically Signed   By: Abigail Miyamoto M.D.   On: 01/25/2017 13:37    ASSESSMENT AND PLAN:  This is a very pleasant 66 years old white female with limited stage small cell lung cancer. She is currently undergoing systemic chemotherapy with cisplatin and etoposide status post 4 cycles. This was concurrent with radiation which was completed last week. She is tolerating her treatment well. The patient had repeat CT scan of the chest performed in 01/25/2017. I personally and independently reviewed the scan images and discuss the results and showed the images to the patient and her husband today. Her scan showed further improvement of her disease. I recommended for her to continue her current systemic chemotherapy with cisplatin and etoposide for 2 more cycles. I will see her back for follow-up visit in 3 weeks for evaluation before starting cycle #6. For the tachycardia, this is likely secondary to anxiety and we will continue to monitor the patient closely and I also advised her to increase her hydration at home. For pain management she will continue on Vicodin for now. For the persistent cough, I advised the patient to take over-the-counter Delsym as needed. For COPD, she will continue her treatment with Advair and albuterol inhaler. She was advised to call immediately if she has any concerning symptoms in the interval. The patient voices understanding of current disease status and treatment options and is in agreement with the  current care plan.  All questions were answered. The patient knows to call the clinic with any problems, questions or concerns. We can certainly see the patient much sooner if necessary.  Disclaimer: This note was dictated with voice recognition software. Similar sounding words can inadvertently be transcribed and may not be corrected upon review.

## 2017-02-14 NOTE — Patient Instructions (Signed)
[facility] Discharge Instructions for Patients Receiving Chemotherapy  Today you received the following chemotherapy agents: Cisplatin and Etoposide.  To help prevent nausea and vomiting after your treatment, we encourage you to take your nausea medication as prescribed.   If you develop nausea and vomiting that is not controlled by your nausea medication, call the clinic.   BELOW ARE SYMPTOMS THAT SHOULD BE REPORTED IMMEDIATELY:  *FEVER GREATER THAN 100.5 F  *CHILLS WITH OR WITHOUT FEVER  NAUSEA AND VOMITING THAT IS NOT CONTROLLED WITH YOUR NAUSEA MEDICATION  *UNUSUAL SHORTNESS OF BREATH  *UNUSUAL BRUISING OR BLEEDING  TENDERNESS IN MOUTH AND THROAT WITH OR WITHOUT PRESENCE OF ULCERS  *URINARY PROBLEMS  *BOWEL PROBLEMS  UNUSUAL RASH Items with * indicate a potential emergency and should be followed up as soon as possible.  Feel free to call the clinic you have any questions or concerns. The clinic phone number is [phone].  Please show the [facility] at check-in to the Emergency Department and triage nurse.

## 2017-02-14 NOTE — Patient Instructions (Addendum)
Steps to Quit Smoking Smoking tobacco can be bad for your health. It can also affect almost every organ in your body. Smoking puts you and people around you at risk for many serious long-lasting (chronic) diseases. Quitting smoking is hard, but it is one of the best things that you can do for your health. It is never too late to quit. What are the benefits of quitting smoking? When you quit smoking, you lower your risk for getting serious diseases and conditions. They can include:  Lung cancer or lung disease.  Heart disease.  Stroke.  Heart attack.  Not being able to have children (infertility).  Weak bones (osteoporosis) and broken bones (fractures). If you have coughing, wheezing, and shortness of breath, those symptoms may get better when you quit. You may also get sick less often. If you are pregnant, quitting smoking can help to lower your chances of having a baby of low birth weight. What can I do to help me quit smoking? Talk with your doctor about what can help you quit smoking. Some things you can do (strategies) include:  Quitting smoking totally, instead of slowly cutting back how much you smoke over a period of time.  Going to in-person counseling. You are more likely to quit if you go to many counseling sessions.  Using resources and support systems, such as:  Online chats with a counselor.  Phone quitlines.  Printed self-help materials.  Support groups or group counseling.  Text messaging programs.  Mobile phone apps or applications.  Taking medicines. Some of these medicines may have nicotine in them. If you are pregnant or breastfeeding, do not take any medicines to quit smoking unless your doctor says it is okay. Talk with your doctor about counseling or other things that can help you. Talk with your doctor about using more than one strategy at the same time, such as taking medicines while you are also going to in-person counseling. This can help make quitting  easier. What things can I do to make it easier to quit? Quitting smoking might feel very hard at first, but there is a lot that you can do to make it easier. Take these steps:  Talk to your family and friends. Ask them to support and encourage you.  Call phone quitlines, reach out to support groups, or work with a counselor.  Ask people who smoke to not smoke around you.  Avoid places that make you want (trigger) to smoke, such as:  Bars.  Parties.  Smoke-break areas at work.  Spend time with people who do not smoke.  Lower the stress in your life. Stress can make you want to smoke. Try these things to help your stress:  Getting regular exercise.  Deep-breathing exercises.  Yoga.  Meditating.  Doing a body scan. To do this, close your eyes, focus on one area of your body at a time from head to toe, and notice which parts of your body are tense. Try to relax the muscles in those areas.  Download or buy apps on your mobile phone or tablet that can help you stick to your quit plan. There are many free apps, such as QuitGuide from the CDC (Centers for Disease Control and Prevention). You can find more support from smokefree.gov and other websites. This information is not intended to replace advice given to you by your health care provider. Make sure you discuss any questions you have with your health care provider. Document Released: 10/01/2009 Document Revised: 08/02/2016 Document   Reviewed: 04/21/2015 Elsevier Interactive Patient Education  2017 Elsevier Inc.  

## 2017-02-15 ENCOUNTER — Other Ambulatory Visit: Payer: BLUE CROSS/BLUE SHIELD

## 2017-02-15 ENCOUNTER — Ambulatory Visit (HOSPITAL_BASED_OUTPATIENT_CLINIC_OR_DEPARTMENT_OTHER): Payer: BLUE CROSS/BLUE SHIELD

## 2017-02-15 ENCOUNTER — Other Ambulatory Visit: Payer: Self-pay | Admitting: Medical Oncology

## 2017-02-15 VITALS — BP 126/82 | HR 102 | Temp 98.2°F | Resp 18

## 2017-02-15 DIAGNOSIS — C3411 Malignant neoplasm of upper lobe, right bronchus or lung: Secondary | ICD-10-CM

## 2017-02-15 DIAGNOSIS — Z5111 Encounter for antineoplastic chemotherapy: Secondary | ICD-10-CM

## 2017-02-15 DIAGNOSIS — C3491 Malignant neoplasm of unspecified part of right bronchus or lung: Secondary | ICD-10-CM

## 2017-02-15 DIAGNOSIS — R51 Headache: Principal | ICD-10-CM

## 2017-02-15 DIAGNOSIS — R519 Headache, unspecified: Secondary | ICD-10-CM

## 2017-02-15 MED ORDER — HYDROCODONE-ACETAMINOPHEN 5-325 MG PO TABS
1.0000 | ORAL_TABLET | ORAL | Status: AC
  Administered 2017-02-15: 1 via ORAL

## 2017-02-15 MED ORDER — HYDROCODONE-ACETAMINOPHEN 5-325 MG PO TABS
ORAL_TABLET | ORAL | Status: AC
  Filled 2017-02-15: qty 1

## 2017-02-15 MED ORDER — DEXAMETHASONE SODIUM PHOSPHATE 10 MG/ML IJ SOLN
INTRAMUSCULAR | Status: AC
  Filled 2017-02-15: qty 1

## 2017-02-15 MED ORDER — SODIUM CHLORIDE 0.9 % IV SOLN
120.0000 mg/m2 | Freq: Once | INTRAVENOUS | Status: AC
  Administered 2017-02-15: 250 mg via INTRAVENOUS
  Filled 2017-02-15: qty 12.5

## 2017-02-15 MED ORDER — DEXAMETHASONE SODIUM PHOSPHATE 10 MG/ML IJ SOLN
10.0000 mg | Freq: Once | INTRAMUSCULAR | Status: AC
  Administered 2017-02-15: 10 mg via INTRAVENOUS

## 2017-02-15 MED ORDER — SODIUM CHLORIDE 0.9 % IV SOLN
Freq: Once | INTRAVENOUS | Status: AC
  Administered 2017-02-15: 13:00:00 via INTRAVENOUS

## 2017-02-16 ENCOUNTER — Ambulatory Visit (HOSPITAL_BASED_OUTPATIENT_CLINIC_OR_DEPARTMENT_OTHER): Payer: Self-pay

## 2017-02-16 VITALS — BP 143/87 | HR 98 | Temp 98.9°F | Resp 18

## 2017-02-16 DIAGNOSIS — C3491 Malignant neoplasm of unspecified part of right bronchus or lung: Secondary | ICD-10-CM

## 2017-02-16 DIAGNOSIS — Z5111 Encounter for antineoplastic chemotherapy: Secondary | ICD-10-CM

## 2017-02-16 DIAGNOSIS — Z5189 Encounter for other specified aftercare: Secondary | ICD-10-CM

## 2017-02-16 DIAGNOSIS — C3411 Malignant neoplasm of upper lobe, right bronchus or lung: Secondary | ICD-10-CM

## 2017-02-16 MED ORDER — ETOPOSIDE CHEMO INJECTION 1 GM/50ML
120.0000 mg/m2 | Freq: Once | INTRAVENOUS | Status: AC
  Administered 2017-02-16: 250 mg via INTRAVENOUS
  Filled 2017-02-16: qty 12.5

## 2017-02-16 MED ORDER — SODIUM CHLORIDE 0.9 % IV SOLN
Freq: Once | INTRAVENOUS | Status: AC
  Administered 2017-02-16: 12:00:00 via INTRAVENOUS

## 2017-02-16 MED ORDER — PEGFILGRASTIM 6 MG/0.6ML ~~LOC~~ PSKT
6.0000 mg | PREFILLED_SYRINGE | Freq: Once | SUBCUTANEOUS | Status: AC
  Administered 2017-02-16: 6 mg via SUBCUTANEOUS
  Filled 2017-02-16: qty 0.6

## 2017-02-16 MED ORDER — DEXAMETHASONE SODIUM PHOSPHATE 10 MG/ML IJ SOLN
INTRAMUSCULAR | Status: AC
  Filled 2017-02-16: qty 1

## 2017-02-16 MED ORDER — DEXAMETHASONE SODIUM PHOSPHATE 10 MG/ML IJ SOLN
10.0000 mg | Freq: Once | INTRAMUSCULAR | Status: AC
  Administered 2017-02-16: 10 mg via INTRAVENOUS

## 2017-02-16 NOTE — Patient Instructions (Signed)
[facility] Discharge Instructions for Patients Receiving Chemotherapy  Today you received the following chemotherapy agents Etoposide.   To help prevent nausea and vomiting after your treatment, we encourage you to take your nausea medication as prescribed.   If you develop nausea and vomiting that is not controlled by your nausea medication, call the clinic.   BELOW ARE SYMPTOMS THAT SHOULD BE REPORTED IMMEDIATELY:  *FEVER GREATER THAN 100.5 F  *CHILLS WITH OR WITHOUT FEVER  NAUSEA AND VOMITING THAT IS NOT CONTROLLED WITH YOUR NAUSEA MEDICATION  *UNUSUAL SHORTNESS OF BREATH  *UNUSUAL BRUISING OR BLEEDING  TENDERNESS IN MOUTH AND THROAT WITH OR WITHOUT PRESENCE OF ULCERS  *URINARY PROBLEMS  *BOWEL PROBLEMS  UNUSUAL RASH Items with * indicate a potential emergency and should be followed up as soon as possible.  Feel free to call the clinic you have any questions or concerns. The clinic phone number is [phone].  Please show the CHEMO ALERT CARD at check-in to the Emergency Department and triage nurse.   

## 2017-02-22 ENCOUNTER — Other Ambulatory Visit: Payer: Self-pay | Admitting: *Deleted

## 2017-02-22 ENCOUNTER — Other Ambulatory Visit: Payer: BLUE CROSS/BLUE SHIELD

## 2017-02-22 ENCOUNTER — Ambulatory Visit: Payer: BLUE CROSS/BLUE SHIELD

## 2017-02-22 ENCOUNTER — Ambulatory Visit: Payer: BLUE CROSS/BLUE SHIELD | Admitting: Internal Medicine

## 2017-02-23 ENCOUNTER — Ambulatory Visit: Payer: BLUE CROSS/BLUE SHIELD

## 2017-02-24 ENCOUNTER — Ambulatory Visit: Payer: BLUE CROSS/BLUE SHIELD

## 2017-02-27 ENCOUNTER — Telehealth: Payer: Self-pay | Admitting: Internal Medicine

## 2017-02-27 NOTE — Telephone Encounter (Signed)
Spoke with patient re appointments for 3/13, 3/19, 3/20 and 3/21.

## 2017-02-28 ENCOUNTER — Other Ambulatory Visit: Payer: BLUE CROSS/BLUE SHIELD

## 2017-03-01 NOTE — Progress Notes (Signed)
Tammy Bowers 66 y.o.female with limited stage small cell carcinoma of the right upper lobe invading the right hilum and mediastinum radiation completed 01-31-17 one month FU.    Weight changes, if any: Wt Readings from Last 3 Encounters:  03/15/17 189 lb 3.2 oz (85.8 kg)  03/06/17 195 lb 12.8 oz (88.8 kg)  02/14/17 193 lb 14.4 oz (88 kg)   Respiratory complaints, if any: persistent cough  Clear to yellow mucus and wheezing,COPD, treatment with Advair and albuterol inhaler per Dr. Julien Nordmann Hemoptysis, if any:  None Swallowing Problems/Pain/Difficulty swallowing:None Smoking Tobacco/Marijuana/Snuff/ETOH UJW:JXBJYN x 50 years 1 P/D smokes marijuana no alcohol usuage Appetite :Fair eating four small meals a day Pain:3/10 to chest taking Hydrocodone When is next chemo scheduled?:Cisplatin and etoposide 2 more cycles last Wednesday 03-08-17 Lab work from of chart:02-14-17 CBC w diff, Cmet Imaging:None 02-14-17 saw Dr. Julien Nordmann COPD,  will continue her treatment with Advair and albuterol inhaler, persistent cough BP (!) 126/97   Pulse 98   Temp 98.1 F (36.7 C) (Oral)   Resp 18   Ht '5\' 6"'$  (1.676 m)   Wt 189 lb 3.2 oz (85.8 kg)   SpO2 98%   BMI 30.54 kg/m

## 2017-03-06 ENCOUNTER — Encounter: Payer: Self-pay | Admitting: Internal Medicine

## 2017-03-06 ENCOUNTER — Ambulatory Visit (HOSPITAL_BASED_OUTPATIENT_CLINIC_OR_DEPARTMENT_OTHER): Payer: Self-pay

## 2017-03-06 ENCOUNTER — Encounter: Payer: Self-pay | Admitting: Oncology

## 2017-03-06 ENCOUNTER — Ambulatory Visit (HOSPITAL_BASED_OUTPATIENT_CLINIC_OR_DEPARTMENT_OTHER): Payer: Self-pay | Admitting: Oncology

## 2017-03-06 ENCOUNTER — Other Ambulatory Visit (HOSPITAL_BASED_OUTPATIENT_CLINIC_OR_DEPARTMENT_OTHER): Payer: Self-pay

## 2017-03-06 VITALS — HR 106

## 2017-03-06 VITALS — BP 120/71 | HR 112 | Temp 98.6°F | Resp 17 | Ht 66.0 in | Wt 195.8 lb

## 2017-03-06 DIAGNOSIS — C3491 Malignant neoplasm of unspecified part of right bronchus or lung: Secondary | ICD-10-CM

## 2017-03-06 DIAGNOSIS — C3411 Malignant neoplasm of upper lobe, right bronchus or lung: Secondary | ICD-10-CM

## 2017-03-06 DIAGNOSIS — J449 Chronic obstructive pulmonary disease, unspecified: Secondary | ICD-10-CM

## 2017-03-06 DIAGNOSIS — R000 Tachycardia, unspecified: Secondary | ICD-10-CM

## 2017-03-06 DIAGNOSIS — Z5111 Encounter for antineoplastic chemotherapy: Secondary | ICD-10-CM

## 2017-03-06 LAB — CBC WITH DIFFERENTIAL/PLATELET
BASO%: 0.3 % (ref 0.0–2.0)
Basophils Absolute: 0 10*3/uL (ref 0.0–0.1)
EOS%: 1.4 % (ref 0.0–7.0)
Eosinophils Absolute: 0.1 10*3/uL (ref 0.0–0.5)
HCT: 30 % — ABNORMAL LOW (ref 34.8–46.6)
HGB: 10.3 g/dL — ABNORMAL LOW (ref 11.6–15.9)
LYMPH%: 9.6 % — ABNORMAL LOW (ref 14.0–49.7)
MCH: 32.1 pg (ref 25.1–34.0)
MCHC: 34.3 g/dL (ref 31.5–36.0)
MCV: 93.5 fL (ref 79.5–101.0)
MONO#: 0.6 10*3/uL (ref 0.1–0.9)
MONO%: 8.4 % (ref 0.0–14.0)
NEUT#: 6.1 10*3/uL (ref 1.5–6.5)
NEUT%: 80.3 % — ABNORMAL HIGH (ref 38.4–76.8)
Platelets: 337 10*3/uL (ref 145–400)
RBC: 3.21 10*6/uL — ABNORMAL LOW (ref 3.70–5.45)
RDW: 15.2 % — ABNORMAL HIGH (ref 11.2–14.5)
WBC: 7.6 10*3/uL (ref 3.9–10.3)
lymph#: 0.7 10*3/uL — ABNORMAL LOW (ref 0.9–3.3)

## 2017-03-06 LAB — COMPREHENSIVE METABOLIC PANEL
ALT: 19 U/L (ref 0–55)
AST: 23 U/L (ref 5–34)
Albumin: 3.8 g/dL (ref 3.5–5.0)
Alkaline Phosphatase: 113 U/L (ref 40–150)
Anion Gap: 9 mEq/L (ref 3–11)
BUN: 31.7 mg/dL — ABNORMAL HIGH (ref 7.0–26.0)
CO2: 24 mEq/L (ref 22–29)
Calcium: 10.1 mg/dL (ref 8.4–10.4)
Chloride: 106 mEq/L (ref 98–109)
Creatinine: 1.2 mg/dL — ABNORMAL HIGH (ref 0.6–1.1)
EGFR: 47 mL/min/{1.73_m2} — ABNORMAL LOW (ref >90)
Glucose: 85 mg/dl (ref 70–140)
Potassium: 4.5 mEq/L (ref 3.5–5.1)
Sodium: 139 mEq/L (ref 136–145)
Total Bilirubin: <0.22 mg/dL (ref 0.20–1.20)
Total Protein: 6.8 g/dL (ref 6.4–8.3)

## 2017-03-06 LAB — MAGNESIUM: Magnesium: 2.3 mg/dl (ref 1.5–2.5)

## 2017-03-06 MED ORDER — POTASSIUM CHLORIDE 2 MEQ/ML IV SOLN
Freq: Once | INTRAVENOUS | Status: AC
  Administered 2017-03-06: 11:00:00 via INTRAVENOUS
  Filled 2017-03-06: qty 10

## 2017-03-06 MED ORDER — SODIUM CHLORIDE 0.9 % IV SOLN
Freq: Once | INTRAVENOUS | Status: AC
  Administered 2017-03-06: 13:00:00 via INTRAVENOUS
  Filled 2017-03-06: qty 5

## 2017-03-06 MED ORDER — PALONOSETRON HCL INJECTION 0.25 MG/5ML
0.2500 mg | Freq: Once | INTRAVENOUS | Status: AC
  Administered 2017-03-06: 0.25 mg via INTRAVENOUS

## 2017-03-06 MED ORDER — CISPLATIN CHEMO INJECTION 100MG/100ML
60.0000 mg/m2 | Freq: Once | INTRAVENOUS | Status: AC
  Administered 2017-03-06: 127 mg via INTRAVENOUS
  Filled 2017-03-06: qty 127

## 2017-03-06 MED ORDER — PALONOSETRON HCL INJECTION 0.25 MG/5ML
INTRAVENOUS | Status: AC
  Filled 2017-03-06: qty 5

## 2017-03-06 MED ORDER — SODIUM CHLORIDE 0.9 % IV SOLN
120.0000 mg/m2 | Freq: Once | INTRAVENOUS | Status: AC
  Administered 2017-03-06: 250 mg via INTRAVENOUS
  Filled 2017-03-06: qty 12.5

## 2017-03-06 MED ORDER — SODIUM CHLORIDE 0.9 % IV SOLN
Freq: Once | INTRAVENOUS | Status: AC
  Administered 2017-03-06: 11:00:00 via INTRAVENOUS

## 2017-03-06 MED ORDER — NICOTINE 14 MG/24HR TD PT24: 14.0000 mg | MEDICATED_PATCH | Freq: Every day | TRANSDERMAL | 0 refills | Status: DC

## 2017-03-06 NOTE — Progress Notes (Signed)
Dr. Julien Nordmann okay to tx with HR of 106.

## 2017-03-06 NOTE — Progress Notes (Signed)
[facility] [phone]   [phone]  OFFICE PROGRESS NOTE  Lujean Amel, MD [address] [place] [address]  DIAGNOSIS: Limited stage (T2b, N2, M0) small cell lung cancer diagnosed in November 2017 and presented with large right hilar mass with mediastinal invasion and a right lower lobe pulmonary nodule.  PRIOR THERAPY: None  CURRENT THERAPY: Systemic chemotherapy with cisplatin 60 MG/M2 on day 1 and etoposide 120 MG/M2 on days 1, 2 and 3 concurrent with radiation status post 5 cycles.  INTERVAL HISTORY: Tammy Bowers 66 y.o. female came to the clinic today for follow-up visit accompanied by her husband. The patient is feeling fine today with no specific complaints except for fatigue and cough. She also reports tenderness to her right hand from a prior IV which has been ongoing for several week. She noticed some swelling to this area about 2 days ago. Thought maybe she was bitten by a bug at that site. No drainage. She is a little bit anxious today and usually anxious before coming for the chemotherapy. Heart rate is elevated today. She denied having any fever or chills. She has no nausea, vomiting, diarrhea or constipation. She has no significant chest pain but has shortness of breath with exertion. Denies hemoptysis. She is here today for evaluation before starting cycle #6.  MEDICAL HISTORY: Past Medical History:  Diagnosis Date  . Adenopathy 10/10/2016   PERICARINAL  . Anemia   . Arthritis   . Asthma   . Cancer ([place])   . COPD (chronic obstructive pulmonary disease) ([place])   . Dyspnea   . Headache    migraines  . Hilar mass 10/10/2016   RIGHT  . History of kidney stones   . Insomnia   . Lung nodules 10/10/2016   RIGHT LOWER LOBE  . Mass of both adrenal glands ([facility]) 10/10/2016  . Mood swing ([place])   . Pneumonia   . Substance abuse    TOBACCO  . UNSPECIFIED INFECTION OF BONE ANKLE AND FOOT 10/22/2009   Annotation:  left ankle Qualifier: Diagnosis of  By: Patsy Baltimore RN, Denise      ALLERGIES:  is allergic to lidocaine.  MEDICATIONS:  Current Outpatient Prescriptions  Medication Sig Dispense Refill  . albuterol (PROVENTIL HFA;VENTOLIN HFA) 108 (90 BASE) MCG/ACT inhaler Inhale 2 puffs into the lungs every 4 (four) hours as needed for wheezing.    . Cyclobenzaprine HCl (FLEXERIL PO) Take 5 mg by mouth 3 (three) times daily as needed.    . Fluticasone-Salmeterol (ADVAIR) 500-50 MCG/DOSE AEPB Inhale 1 puff into the lungs 2 (two) times daily.    Marland Kitchen HYDROcodone-acetaminophen (NORCO/VICODIN) 5-325 MG tablet Take 1 tablet by mouth 2 (two) times daily as needed.    Marland Kitchen LORazepam (ATIVAN) 0.5 MG tablet Take 1 tablet (0.5 mg total) by mouth at bedtime. 20 tablet 0  . Misc Natural Products (GLUCOSAMINE CHOND COMPLEX/MSM PO) Take 1 tablet by mouth every other day.    . Multiple Vitamin (MULTIVITAMIN WITH MINERALS) TABS tablet Take 1 tablet by mouth every other day. Women's Multivitamin     . Omega-3 Fatty Acids (FISH OIL) 1000 MG CAPS Take 1,000 mg by mouth every other day.    Marland Kitchen OVER THE COUNTER MEDICATION Take 1 mL by mouth 2 (two) times daily. CBD Extract    . prochlorperazine (COMPAZINE) 10 MG tablet TAKE 1 TABLET BY MOUTH EVERY 6 HOURS AS NEEDED FOR NAUSEA AND VOMITING 30 tablet 0   No  current facility-administered medications for this visit.     SURGICAL HISTORY:  Past Surgical History:  Procedure Laterality Date  . ANKLE SURGERY    . APPENDECTOMY    . LUNG BIOPSY N/A 10/19/2016   Procedure: LUNG BIOPSY;  Surgeon: Grace Isaac, MD;  Location: [facility];  Service: Thoracic;  Laterality: N/A;  . TONSILLECTOMY    . VIDEO BRONCHOSCOPY WITH ENDOBRONCHIAL ULTRASOUND N/A 10/19/2016   Procedure: VIDEO BRONCHOSCOPY WITH ENDOBRONCHIAL ULTRASOUND;  Surgeon: Grace Isaac, MD;  Location: [facility];  Service: Thoracic;  Laterality: N/A;    REVIEW OF SYSTEMS:  Constitutional: positive for fatigue Eyes: negative Ears,  nose, mouth, throat, and face: negative Respiratory: positive for cough and dyspnea on exertion Cardiovascular: negative Gastrointestinal: negative Genitourinary:negative Integument/breast: positive for Right hand with small area of swelling near prior IV site.  Hematologic/lymphatic: negative Musculoskeletal:negative Neurological: negative Behavioral/Psych: negative Endocrine: negative Allergic/Immunologic: negative   PHYSICAL EXAMINATION: General appearance: alert, cooperative, fatigued and no distress Head: Normocephalic, without obvious abnormality, atraumatic Neck: no adenopathy, no JVD, supple, symmetrical, trachea midline and thyroid not enlarged, symmetric, no tenderness/mass/nodules Lymph nodes: Cervical, supraclavicular, and axillary nodes normal. Resp: wheezes bilaterally Back: symmetric, no curvature. ROM normal. No CVA tenderness. Cardio: Tachycardic GI: soft, non-tender; bowel sounds normal; no masses,  no organomegaly Extremities: extremities normal, atraumatic, no cyanosis or edema Neurologic: Alert and oriented X 3, normal strength and tone. Normal symmetric reflexes. Normal coordination and gait  Skin: Small area of edema to right hand approximately .5 cm in diameter. Tender to touch.   ECOG PERFORMANCE STATUS: 1 - Symptomatic but completely ambulatory  Blood pressure 120/71, pulse (!) 112, temperature 98.6 F (37 C), temperature source Oral, resp. rate 17, height '5\' 6"'$  (1.676 m), weight 195 lb 12.8 oz (88.8 kg), SpO2 97 %.  LABORATORY DATA: Lab Results  Component Value Date   WBC 7.6 03/06/2017   HGB 10.3 (L) 03/06/2017   HCT 30.0 (L) 03/06/2017   MCV 93.5 03/06/2017   PLT 337 03/06/2017      Chemistry      Component Value Date/Time   NA 140 02/14/2017 0922   K 4.3 02/14/2017 0922   CL 106 11/21/2016 0427   CO2 25 02/14/2017 0922   BUN 19.6 02/14/2017 0922   CREATININE 1.1 02/14/2017 0922      Component Value Date/Time   CALCIUM 9.9 02/14/2017  0922   ALKPHOS 96 02/14/2017 0922   AST 17 02/14/2017 0922   ALT 12 02/14/2017 0922   BILITOT 0.38 02/14/2017 0922       RADIOGRAPHIC STUDIES: No results found.  ASSESSMENT AND PLAN:  This is a very pleasant 66 year old white female with limited stage small cell lung cancer. She is currently undergoing systemic chemotherapy with cisplatin and etoposide status post 5 cycles. This was concurrent with radiation. She is tolerating her treatment well. The patient had repeat CT scan of the chest performed in 01/25/2017 which showed further improvement of her disease. I recommended for her to continue her current systemic chemotherapy with cisplatin and etoposide for 2 more cycles. She is due for cycle #6 today and will proceed with that as scheduled.  She will have a restaging CT of the chest in 3-4 weeks to assess response to treatment. She will follow up with Dr. Julien Nordmann after the CT to review results.   The patient continues to smoke 3-4 cigarettes per day. She is interested in quitting. She did not tolerate Chantix. Will start her on Nicoderm  patches.   For the right hand phlebitis, she was advised to use warm compresses.  For the tachycardia, this is likely secondary to anxiety and we will continue to monitor the patient closely and I also advised her to increase her hydration at home. For pain management she will continue on Vicodin for now. For the persistent cough, I advised the patient to take over-the-counter Delsym as needed. For COPD, she will continue her treatment with Advair and albuterol inhaler. She was advised to call immediately if she has any concerning symptoms in the interval. The patient voices understanding of current disease status and treatment options and is in agreement with the current care plan.  The patient was seen with Dr. Julien Nordmann.   All questions were answered. The patient knows to call the clinic with any problems, questions or concerns. We can certainly  see the patient much sooner if necessary.   Mikey Bussing, DNP, AGPCNP-BC, AOCNP   ADDENDUM: Hematology/Oncology Attending: I had a face to face encounter with the patient today. I recommended her care plan. This is a very pleasant 65 years old white female with limited stage small cell lung cancer currently undergoing systemic chemotherapy with cisplatin and etoposide status post 5 cycles. She is tolerating her treatment well except for fatigue. I recommended for her to proceed with cycle #6 today as a scheduled. I would see her back for follow-up visit in one month's for evaluation after repeating CT scan of the chest for restaging of her disease. She would be considered for prophylactic cranial irradiation at that time if she has no evidence of residual disease. I also encouraged the patient to quit smoking. She was advised to call immediately if she has any concerning symptoms in the interval. Disclaimer: This note was dictated with voice recognition software. Similar sounding words can inadvertently be transcribed and may be missed upon review. Eilleen Kempf., MD 03/06/17

## 2017-03-06 NOTE — Progress Notes (Signed)
Went to treatment area to follow up on email received from registration regarding patient being uninsured. Patient was asleep. Left my card at her bedside table and asked the RN to have her to stop by my office before she leaves. Will discuss insurance and offer [employer] if needed as well as apply for [employer] for Neulasta.

## 2017-03-06 NOTE — Patient Instructions (Signed)
[facility] Discharge Instructions for Patients Receiving Chemotherapy  Today you received the following chemotherapy agents:  Cisplatin and Etoposide  To help prevent nausea and vomiting after your treatment, we encourage you to take your nausea medication as prescribed.   If you develop nausea and vomiting that is not controlled by your nausea medication, call the clinic.   BELOW ARE SYMPTOMS THAT SHOULD BE REPORTED IMMEDIATELY:  *FEVER GREATER THAN 100.5 F  *CHILLS WITH OR WITHOUT FEVER  NAUSEA AND VOMITING THAT IS NOT CONTROLLED WITH YOUR NAUSEA MEDICATION  *UNUSUAL SHORTNESS OF BREATH  *UNUSUAL BRUISING OR BLEEDING  TENDERNESS IN MOUTH AND THROAT WITH OR WITHOUT PRESENCE OF ULCERS  *URINARY PROBLEMS  *BOWEL PROBLEMS  UNUSUAL RASH Items with * indicate a potential emergency and should be followed up as soon as possible.  Feel free to call the clinic you have any questions or concerns. The clinic phone number is [phone].  Please show the [facility] at check-in to the Emergency Department and triage nurse.

## 2017-03-06 NOTE — Progress Notes (Signed)
Went back to treatment area and patient was awake. Asked patient did her insurance coverage end. Patient states somewhere between last Thursday and today something happened with the insurance and her spouse has been working on it all day. Patient states they have [employer] through the [employer] and her spouse just had surgery last week. Advised patient I just wanted to be sure she didn't need assistance from me. Patient thanked me and I gave her my card for any additional financial questions or concerns.

## 2017-03-07 ENCOUNTER — Encounter: Payer: Self-pay | Admitting: Nurse Practitioner

## 2017-03-07 ENCOUNTER — Ambulatory Visit (HOSPITAL_BASED_OUTPATIENT_CLINIC_OR_DEPARTMENT_OTHER): Payer: Self-pay

## 2017-03-07 ENCOUNTER — Ambulatory Visit (HOSPITAL_BASED_OUTPATIENT_CLINIC_OR_DEPARTMENT_OTHER): Payer: Self-pay | Admitting: Nurse Practitioner

## 2017-03-07 VITALS — BP 146/89 | HR 110 | Temp 98.5°F | Resp 16

## 2017-03-07 DIAGNOSIS — Z5111 Encounter for antineoplastic chemotherapy: Secondary | ICD-10-CM

## 2017-03-07 DIAGNOSIS — C3411 Malignant neoplasm of upper lobe, right bronchus or lung: Secondary | ICD-10-CM

## 2017-03-07 DIAGNOSIS — C3491 Malignant neoplasm of unspecified part of right bronchus or lung: Secondary | ICD-10-CM

## 2017-03-07 DIAGNOSIS — T80818A Extravasation of other vesicant agent, initial encounter: Secondary | ICD-10-CM

## 2017-03-07 DIAGNOSIS — R600 Localized edema: Secondary | ICD-10-CM

## 2017-03-07 MED ORDER — HOT PACK MISC ONCOLOGY
1.0000 | Freq: Once | Status: AC | PRN
  Administered 2017-03-07: 1 via TOPICAL
  Filled 2017-03-07: qty 1

## 2017-03-07 MED ORDER — SODIUM CHLORIDE 0.9 % IV SOLN
120.0000 mg/m2 | Freq: Once | INTRAVENOUS | Status: AC
  Administered 2017-03-07: 250 mg via INTRAVENOUS
  Filled 2017-03-07: qty 12.5

## 2017-03-07 MED ORDER — DEXAMETHASONE SODIUM PHOSPHATE 10 MG/ML IJ SOLN
INTRAMUSCULAR | Status: AC
  Filled 2017-03-07: qty 1

## 2017-03-07 MED ORDER — SODIUM CHLORIDE 0.9 % IV SOLN
Freq: Once | INTRAVENOUS | Status: AC
  Administered 2017-03-07: 14:00:00 via INTRAVENOUS

## 2017-03-07 MED ORDER — DEXAMETHASONE SODIUM PHOSPHATE 10 MG/ML IJ SOLN
10.0000 mg | Freq: Once | INTRAMUSCULAR | Status: AC
  Administered 2017-03-07: 10 mg via INTRAVENOUS

## 2017-03-07 NOTE — Assessment & Plan Note (Signed)
Patient was in the midst of receiving cycle 6, day 2 of the etoposide portion of her IV infusion to her right wrist.  She noted some edema to the site.  The infusion had almost completed at that time.  The infusion was held per protocol.  On exam today.  It appears that there is a probable extravasation to the right radial site at the peripheral insertion site.  Area is edematous; with no erythema, warmth, or tenderness.  The arm was elevated and heat pack was applied per protocol.  Patient will return tomorrow for day 3 of her chemotherapy in 2 exam in her right wrist site per protocol as well.  Patient will be given follow-up appointments for a brief check of the extravasation site per protocol as well.  She was given both verbal and written instructions regarding extravasation protocol as well.  She was encouraged to go directly to the emergency department for any worsening symptoms whatsoever.

## 2017-03-07 NOTE — Progress Notes (Signed)
SYMPTOM MANAGEMENT CLINIC    Chief Complaint: Extravasation  HPI:  Tammy Bowers 66 y.o. female diagnosed with lung cancer.  Currently undergoing cisplatin/etoposide chemotherapy therapy regimen.   Oncology History   Patient presented with SOB and cough.  Work up showed right hilar mass.   Small cell lung carcinoma, right (HCC)   Staging form: Lung, AJCC 7th Edition   - Clinical stage from 10/27/2016: Stage IIIA (T2b, N2, M0) - Signed by Curt Bears, MD on 10/27/2016      Small cell lung carcinoma, right ([facility] [facility])   10/11/2016 Imaging    CT Chest Right hilar 5.7 x 4.8 x 6.1 cm mass causing mass effect on the distal right pulmonary artery and right lower lobe bronchus and causing right middle lobe collapse. There are smaller nodular as well as ill-defined satellite lesions in the superior segment of the right lower lobe, right precarinal lymphadenopathy and bilateral adrenal masses consistent with metastasis.      10/19/2016 Surgery    Bronchoscopy with endobronchial ultrasound and biopsy of right lung mass and #7 lymph nodes.       10/20/2016 Pathology Results    Lung, biopsy, Intermedial Bronchus - SMALL CELL CARCINOMA.      10/21/2016 Imaging    PET Large hypermetabolic right upper lobe lung mass invading the right hilum and mediastinum and partially obstructing the right middle and lower lobe bronchi. Distal obstructive pneumonia versus interstitial spread of tumor in the right lower lobe.      10/27/2016 Initial Diagnosis    Small cell lung carcinoma, right ([facility])     11/07/2016 -  Chemotherapy    The patient had palonosetron (ALOXI) injection 0.25 mg, 0.25 mg, Intravenous,  Once, 3 of 5 cycles  pegfilgrastim (NEULASTA ONPRO KIT) injection 6 mg, 6 mg, Subcutaneous, Once, 2 of 4 cycles  CISplatin (PLATINOL) 127 mg in sodium chloride 0.9 % 500 mL chemo infusion, 60 mg/m2 = 127 mg, Intravenous,  Once, 3 of 5 cycles  etoposide (VEPESID) 250 mg in sodium chloride 0.9  % 1,000 mL chemo infusion, 120 mg/m2 = 250 mg, Intravenous,  Once, 3 of 5 cycles  fosaprepitant (EMEND) 150 mg, dexamethasone (DECADRON) 12 mg in sodium chloride 0.9 % 145 mL IVPB, , Intravenous,  Once, 3 of 5 cycles  for chemotherapy treatment.        12/02/2016 -  Radiation Therapy    SIM        Review of Systems  Skin:       Edema to the right radial site.  All other systems reviewed and are negative.   Past Medical History:  Diagnosis Date  . Adenopathy 10/10/2016   PERICARINAL  . Anemia   . Arthritis   . Asthma   . Cancer ([facility])   . COPD (chronic obstructive pulmonary disease) ([facility])   . Dyspnea   . Headache    migraines  . Hilar mass 10/10/2016   RIGHT  . History of kidney stones   . Insomnia   . Lung nodules 10/10/2016   RIGHT LOWER LOBE  . Mass of both adrenal glands ([facility]) 10/10/2016  . Mood swing ([place] [facility])   . Pneumonia   . Substance abuse    TOBACCO  . UNSPECIFIED INFECTION OF BONE ANKLE AND FOOT 10/22/2009   Annotation: left ankle Qualifier: Diagnosis of  By: Gustavo Lah      Past Surgical History:  Procedure Laterality Date  . ANKLE SURGERY    . APPENDECTOMY    .  LUNG BIOPSY N/A 10/19/2016   Procedure: LUNG BIOPSY;  Surgeon: Grace Isaac, MD;  Location: [facility];  Service: Thoracic;  Laterality: N/A;  . TONSILLECTOMY    . VIDEO BRONCHOSCOPY WITH ENDOBRONCHIAL ULTRASOUND N/A 10/19/2016   Procedure: VIDEO BRONCHOSCOPY WITH ENDOBRONCHIAL ULTRASOUND;  Surgeon: Grace Isaac, MD;  Location: [facility];  Service: Thoracic;  Laterality: N/A;    has UNSPECIFIED INFECTION OF BONE ANKLE AND FOOT; Asthma; COPD (chronic obstructive pulmonary disease) ([facility]); Insomnia; Mood swing ([facility]); Hilar mass; Lung nodules; Adenopathy; Mass of both adrenal glands ([facility]); Substance abuse; Small cell lung carcinoma, right ([facility]); Encounter for antineoplastic chemotherapy; Pancytopenia due to antineoplastic chemotherapy ([facility]); Neutropenic fever ([place]); Neutropenia ([facility]);  Bronchitis; and Extravasation accident on her problem list.    is allergic to lidocaine.  Allergies as of 03/07/2017      Reactions   Lidocaine Swelling   SWELLING REACTION UNSPECIFIED       Medication List       Accurate as of 03/07/17  5:36 PM. Always use your most recent med list.          albuterol 108 (90 Base) MCG/ACT inhaler Commonly known as:  PROVENTIL HFA;VENTOLIN HFA Inhale 2 puffs into the lungs every 4 (four) hours as needed for wheezing.   CHANTIX 1 MG tablet Generic drug:  varenicline TK 1 T PO BID   Fish Oil 1000 MG Caps Take 1,000 mg by mouth every other day.   FLEXERIL PO Take 5 mg by mouth 3 (three) times daily as needed.   Fluticasone-Salmeterol 500-50 MCG/DOSE Aepb Commonly known as:  ADVAIR Inhale 1 puff into the lungs 2 (two) times daily.   GLUCOSAMINE CHOND COMPLEX/MSM PO Take 1 tablet by mouth every other day.   HYDROcodone-acetaminophen 5-325 MG tablet Commonly known as:  NORCO/VICODIN Take 1 tablet by mouth 2 (two) times daily as needed.   LORazepam 0.5 MG tablet Commonly known as:  ATIVAN Take 1 tablet (0.5 mg total) by mouth at bedtime.   multivitamin with minerals Tabs tablet Take 1 tablet by mouth every other day. Women's Multivitamin   nicotine 14 mg/24hr patch Commonly known as:  NICODERM CQ Place 1 patch (14 mg total) onto the skin daily.   OVER THE COUNTER MEDICATION Take 1 mL by mouth 2 (two) times daily. CBD Extract   prochlorperazine 10 MG tablet Commonly known as:  COMPAZINE TAKE 1 TABLET BY MOUTH EVERY 6 HOURS AS NEEDED FOR NAUSEA AND VOMITING        PHYSICAL EXAMINATION  Oncology Vitals 03/07/2017 03/06/2017  Height - -  Weight - -  Weight (lbs) - -  BMI (kg/m2) - -  Temp 98.5 -  Pulse 110 106  Resp 16 -  SpO2 97 -  BSA (m2) - -   BP Readings from Last 2 Encounters:  03/07/17 (!) 146/89  03/06/17 120/71    Physical Exam  Constitutional: She is oriented to person, place, and time and well-developed,  well-nourished, and in no distress.  HENT:  Head: Normocephalic and atraumatic.  Eyes: Conjunctivae and EOM are normal. Pupils are equal, round, and reactive to light.  Neck: Normal range of motion.  Pulmonary/Chest: Effort normal. No respiratory distress.  Musculoskeletal: Normal range of motion. She exhibits edema. She exhibits no tenderness.  Mild to moderate edema to the right radial wrist at the IV insertion site.  Neurological: She is alert and oriented to person, place, and time.  Skin: Skin is warm and dry.  Psychiatric: Affect normal.  Nursing note and vitals reviewed.   LABORATORY DATA:. Appointment on 03/06/2017  Component Date Value Ref Range Status  . WBC 03/06/2017 7.6  3.9 - 10.3 10e3/uL Final  . NEUT# 03/06/2017 6.1  1.5 - 6.5 10e3/uL Final  . HGB 03/06/2017 10.3* 11.6 - 15.9 g/dL Final  . HCT 03/06/2017 30.0* 34.8 - 46.6 % Final  . Platelets 03/06/2017 337  145 - 400 10e3/uL Final  . MCV 03/06/2017 93.5  79.5 - 101.0 fL Final  . MCH 03/06/2017 32.1  25.1 - 34.0 pg Final  . MCHC 03/06/2017 34.3  31.5 - 36.0 g/dL Final  . RBC 03/06/2017 3.21* 3.70 - 5.45 10e6/uL Final  . RDW 03/06/2017 15.2* 11.2 - 14.5 % Final  . lymph# 03/06/2017 0.7* 0.9 - 3.3 10e3/uL Final  . MONO# 03/06/2017 0.6  0.1 - 0.9 10e3/uL Final  . Eosinophils Absolute 03/06/2017 0.1  0.0 - 0.5 10e3/uL Final  . Basophils Absolute 03/06/2017 0.0  0.0 - 0.1 10e3/uL Final  . NEUT% 03/06/2017 80.3* 38.4 - 76.8 % Final  . LYMPH% 03/06/2017 9.6* 14.0 - 49.7 % Final  . MONO% 03/06/2017 8.4  0.0 - 14.0 % Final  . EOS% 03/06/2017 1.4  0.0 - 7.0 % Final  . BASO% 03/06/2017 0.3  0.0 - 2.0 % Final  . Sodium 03/06/2017 139  136 - 145 mEq/L Final  . Potassium 03/06/2017 4.5  3.5 - 5.1 mEq/L Final  . Chloride 03/06/2017 106  98 - 109 mEq/L Final  . CO2 03/06/2017 24  22 - 29 mEq/L Final  . Glucose 03/06/2017 85  70 - 140 mg/dl Final  . BUN 03/06/2017 31.7* 7.0 - 26.0 mg/dL Final  . Creatinine 03/06/2017 1.2* 0.6  - 1.1 mg/dL Final  . Total Bilirubin 03/06/2017 <0.22  0.20 - 1.20 mg/dL Final  . Alkaline Phosphatase 03/06/2017 113  40 - 150 U/L Final  . AST 03/06/2017 23  5 - 34 U/L Final  . ALT 03/06/2017 19  0 - 55 U/L Final  . Total Protein 03/06/2017 6.8  6.4 - 8.3 g/dL Final  . Albumin 03/06/2017 3.8  3.5 - 5.0 g/dL Final  . Calcium 03/06/2017 10.1  8.4 - 10.4 mg/dL Final  . Anion Gap 03/06/2017 9  3 - 11 mEq/L Final  . EGFR 03/06/2017 47* >90 ml/min/1.73 m2 Final  . Magnesium 03/06/2017 2.3  1.5 - 2.5 mg/dl Final    RADIOGRAPHIC STUDIES: No results found.  ASSESSMENT/PLAN:    Small cell lung carcinoma, right (HCC) Patient received cycle 6, day 1 of her cisplatin/etoposide chemotherapy regimen on 03/06/2017.  She returned to the [facility] today to receive cycle 6, day 2 of the same regimen.  See further notes for details of today's visit.  Patient is scheduled to return tomorrow, 03/08/2017 for cycle 6, day 3 of her chemotherapy.  Dr. Julien Nordmann has not requested or scheduled any further appointments.  Will review all with Dr. Julien Nordmann and confirm when next appointments are due.  Extravasation accident Patient was in the midst of receiving cycle 6, day 2 of the etoposide portion of her IV infusion to her right wrist.  She noted some edema to the site.  The infusion had almost completed at that time.  The infusion was held per protocol.  On exam today.  It appears that there is a probable extravasation to the right radial site at the peripheral insertion site.  Area is edematous; with no erythema, warmth, or tenderness.  The arm was elevated and  heat pack was applied per protocol.  Patient will return tomorrow for day 3 of her chemotherapy in 2 exam in her right wrist site per protocol as well.  Patient will be given follow-up appointments for a brief check of the extravasation site per protocol as well.  She was given both verbal and written instructions regarding extravasation protocol as  well.  She was encouraged to go directly to the emergency department for any worsening symptoms whatsoever.   Patient stated understanding of all instructions; and was in agreement with this plan of care. The patient knows to call the clinic with any problems, questions or concerns.   Total time spent with patient was 15 minutes;  with greater than 75 percent of that time spent in face to face counseling regarding patient's symptoms,  and coordination of care and follow up.  Disclaimer:This dictation was prepared with Dragon/digital dictation along with [employer]. Any transcriptional errors that result from this process are unintentional.  Drue Second, NP 03/07/2017

## 2017-03-07 NOTE — Patient Instructions (Signed)
[facility] Discharge Instructions for Patients Receiving Chemotherapy  Today you received the following chemotherapy agents Etoposide.   To help prevent nausea and vomiting after your treatment, we encourage you to take your nausea medication as prescribed.   If you develop nausea and vomiting that is not controlled by your nausea medication, call the clinic.   BELOW ARE SYMPTOMS THAT SHOULD BE REPORTED IMMEDIATELY:  *FEVER GREATER THAN 100.5 F  *CHILLS WITH OR WITHOUT FEVER  NAUSEA AND VOMITING THAT IS NOT CONTROLLED WITH YOUR NAUSEA MEDICATION  *UNUSUAL SHORTNESS OF BREATH  *UNUSUAL BRUISING OR BLEEDING  TENDERNESS IN MOUTH AND THROAT WITH OR WITHOUT PRESENCE OF ULCERS  *URINARY PROBLEMS  *BOWEL PROBLEMS  UNUSUAL RASH Items with * indicate a potential emergency and should be followed up as soon as possible.  Feel free to call the clinic you have any questions or concerns. The clinic phone number is [phone].  Please show the CHEMO ALERT CARD at check-in to the Emergency Department and triage nurse.   

## 2017-03-07 NOTE — Progress Notes (Signed)
Dr. Julien Nordmann okay to tx today with HR 110.  Pt IV extravastion at 1615. No complaint of pain or burning from patient. Site feels warm and tight. Edema present at site of IV. Tammy Lesser, Tammy Bowers at chairside to discuss potential side effects and care of site. Hot packs given to pt to keep site warm and increase reabsorption. Pt understands instructions and was given educational sheet. Pt understands that she will return the next two days and in one week for extravasation check in the infusion room. In-basket placed to scheduling for additional appointment times on Thursday (03/09/17) and next Thursday (03/14/17).

## 2017-03-07 NOTE — Assessment & Plan Note (Signed)
Patient received cycle 6, day 1 of her cisplatin/etoposide chemotherapy regimen on 03/06/2017.  She returned to the [facility] today to receive cycle 6, day 2 of the same regimen.  See further notes for details of today's visit.  Patient is scheduled to return tomorrow, 03/08/2017 for cycle 6, day 3 of her chemotherapy.  Dr. Julien Nordmann has not requested or scheduled any further appointments.  Will review all with Dr. Julien Nordmann and confirm when next appointments are due.

## 2017-03-08 ENCOUNTER — Ambulatory Visit (HOSPITAL_BASED_OUTPATIENT_CLINIC_OR_DEPARTMENT_OTHER): Payer: Self-pay

## 2017-03-08 DIAGNOSIS — C3411 Malignant neoplasm of upper lobe, right bronchus or lung: Secondary | ICD-10-CM

## 2017-03-08 DIAGNOSIS — C3491 Malignant neoplasm of unspecified part of right bronchus or lung: Secondary | ICD-10-CM

## 2017-03-08 DIAGNOSIS — Z5111 Encounter for antineoplastic chemotherapy: Secondary | ICD-10-CM

## 2017-03-08 DIAGNOSIS — Z5189 Encounter for other specified aftercare: Secondary | ICD-10-CM

## 2017-03-08 MED ORDER — PEGFILGRASTIM 6 MG/0.6ML ~~LOC~~ PSKT
6.0000 mg | PREFILLED_SYRINGE | Freq: Once | SUBCUTANEOUS | Status: AC
  Administered 2017-03-08: 6 mg via SUBCUTANEOUS
  Filled 2017-03-08: qty 0.6

## 2017-03-08 MED ORDER — SODIUM CHLORIDE 0.9 % IV SOLN
Freq: Once | INTRAVENOUS | Status: AC
  Administered 2017-03-08: 12:00:00 via INTRAVENOUS

## 2017-03-08 MED ORDER — DEXAMETHASONE SODIUM PHOSPHATE 10 MG/ML IJ SOLN
10.0000 mg | Freq: Once | INTRAMUSCULAR | Status: AC
  Administered 2017-03-08: 10 mg via INTRAVENOUS

## 2017-03-08 MED ORDER — DEXAMETHASONE SODIUM PHOSPHATE 10 MG/ML IJ SOLN
INTRAMUSCULAR | Status: AC
Start: 2017-03-08 — End: 2017-03-08
  Filled 2017-03-08: qty 1

## 2017-03-08 MED ORDER — SODIUM CHLORIDE 0.9 % IV SOLN
120.0000 mg/m2 | Freq: Once | INTRAVENOUS | Status: AC
  Administered 2017-03-08: 250 mg via INTRAVENOUS
  Filled 2017-03-08: qty 12.5

## 2017-03-08 NOTE — Patient Instructions (Signed)
[facility] Discharge Instructions for Patients Receiving Chemotherapy  Today you received the following chemotherapy agents: Etoposide   To help prevent nausea and vomiting after your treatment, we encourage you to take your nausea medication as directed.    If you develop nausea and vomiting that is not controlled by your nausea medication, call the clinic.   BELOW ARE SYMPTOMS THAT SHOULD BE REPORTED IMMEDIATELY:  *FEVER GREATER THAN 100.5 F  *CHILLS WITH OR WITHOUT FEVER  NAUSEA AND VOMITING THAT IS NOT CONTROLLED WITH YOUR NAUSEA MEDICATION  *UNUSUAL SHORTNESS OF BREATH  *UNUSUAL BRUISING OR BLEEDING  TENDERNESS IN MOUTH AND THROAT WITH OR WITHOUT PRESENCE OF ULCERS  *URINARY PROBLEMS  *BOWEL PROBLEMS  UNUSUAL RASH Items with * indicate a potential emergency and should be followed up as soon as possible.  Feel free to call the clinic you have any questions or concerns. The clinic phone number is [phone].  Please show the CHEMO ALERT CARD at check-in to the Emergency Department and triage nurse.   

## 2017-03-09 ENCOUNTER — Ambulatory Visit: Payer: Self-pay

## 2017-03-14 ENCOUNTER — Ambulatory Visit: Payer: Self-pay

## 2017-03-15 ENCOUNTER — Encounter: Payer: Self-pay | Admitting: Radiation Oncology

## 2017-03-15 ENCOUNTER — Other Ambulatory Visit: Payer: BLUE CROSS/BLUE SHIELD

## 2017-03-15 ENCOUNTER — Ambulatory Visit
Admission: RE | Admit: 2017-03-15 | Discharge: 2017-03-15 | Disposition: A | Discharge status: Home / Self Care | Payer: BLUE CROSS/BLUE SHIELD | Source: Ambulatory Visit | Attending: Radiation Oncology | Admitting: Radiation Oncology

## 2017-03-15 VITALS — BP 126/97 | HR 98 | Temp 98.1°F | Resp 18 | Ht 66.0 in | Wt 189.2 lb

## 2017-03-15 DIAGNOSIS — C3401 Malignant neoplasm of right main bronchus: Secondary | ICD-10-CM

## 2017-03-15 DIAGNOSIS — C3411 Malignant neoplasm of upper lobe, right bronchus or lung: Secondary | ICD-10-CM | POA: Diagnosis not present

## 2017-03-15 DIAGNOSIS — Z79899 Other long term (current) drug therapy: Secondary | ICD-10-CM | POA: Insufficient documentation

## 2017-03-15 DIAGNOSIS — C3491 Malignant neoplasm of unspecified part of right bronchus or lung: Secondary | ICD-10-CM

## 2017-03-15 NOTE — Progress Notes (Signed)
Radiation Oncology         [phone] ________________________________  Name: Tammy Bowers MRN: [id number]  Date: 03/15/2017  DOB: 09-23-1951  Post Treatment Note  CC: Lujean Amel, MD  Grace Isaac, MD  Diagnosis:   Limited stage T2bN2M0 small cell carcinoma of the right upper lobe of the lung invading the right hilum and mediastinum.  Interval Since Last Radiation:  6 weeks   12/13/2016 to 01/31/2017: The Right lung SCLC was treated to 59.4 Gy in 33 fractions at 1.8 Gy per fraction.   Narrative:  The patient returns today for routine follow-up. She tolerated treatment relatively well but did experience some mild, intermittent aching in the chest wall, cough, SOB and wheezing.  She completed chemotherapy on 03/08/17 and is scheduled for restaging CT Chest prior to her follow up with Dr. Earlie Server 03/30/17.                        On review of systems, the patient states that she is doing well and is without complaints.  She continues with mild SOB and wheezing but states that this was present prior to treatment and unchanged. She denies chest pain, fever, chills, N/V or diarhhea.  Her weight is stable and she reports that her energy is gradually returning.   ALLERGIES:  is allergic to lidocaine.  Meds: Current Outpatient Prescriptions  Medication Sig Dispense Refill  . albuterol (PROVENTIL HFA;VENTOLIN HFA) 108 (90 BASE) MCG/ACT inhaler Inhale 2 puffs into the lungs every 4 (four) hours as needed for wheezing.    . Cyclobenzaprine HCl (FLEXERIL PO) Take 5 mg by mouth 3 (three) times daily as needed.    . Fluticasone-Salmeterol (ADVAIR) 500-50 MCG/DOSE AEPB Inhale 1 puff into the lungs 2 (two) times daily.    Marland Kitchen HYDROcodone-acetaminophen (NORCO/VICODIN) 5-325 MG tablet Take 1 tablet by mouth 2 (two) times daily as needed.    Marland Kitchen LORazepam (ATIVAN) 0.5 MG tablet Take 1 tablet (0.5 mg total) by mouth at bedtime. 20 tablet 0  . OVER THE COUNTER MEDICATION Take 1 mL by mouth 2 (two) times  daily. CBD Extract    . CHANTIX 1 MG tablet TK 1 T PO BID  1  . Misc Natural Products (GLUCOSAMINE CHOND COMPLEX/MSM PO) Take 1 tablet by mouth every other day.    . Multiple Vitamin (MULTIVITAMIN WITH MINERALS) TABS tablet Take 1 tablet by mouth every other day. Women's Multivitamin     . nicotine (NICODERM CQ) 14 mg/24hr patch Place 1 patch (14 mg total) onto the skin daily. (Patient not taking: Reported on 03/15/2017) 28 patch 0  . Omega-3 Fatty Acids (FISH OIL) 1000 MG CAPS Take 1,000 mg by mouth every other day.    . prochlorperazine (COMPAZINE) 10 MG tablet TAKE 1 TABLET BY MOUTH EVERY 6 HOURS AS NEEDED FOR NAUSEA AND VOMITING 30 tablet 0   No current facility-administered medications for this encounter.     Physical Findings:  height is '5\' 6"'$  (1.676 m) and weight is 189 lb 3.2 oz (85.8 kg). Her oral temperature is 98.1 F (36.7 C). Her blood pressure is 126/97 (abnormal) and her pulse is 98. Her respiration is 18 and oxygen saturation is 98%.  Pain Assessment Pain Score: 3  (Chest)/10 In general this is a well appearing caucasian femalein no acute distress. She's alert and oriented x4 and appropriate throughout the examination. Cardiopulmonary assessment is negative for acute distress and she exhibits normal effort.   Lab Findings: Lab  Results  Component Value Date   WBC 7.6 03/06/2017   HGB 10.3 (L) 03/06/2017   HCT 30.0 (L) 03/06/2017   MCV 93.5 03/06/2017   PLT 337 03/06/2017     Radiographic Findings: No results found.  Impression/Plan: 1. Limited stage T2bN2M0 small cell carcinoma of the right upper lobe of the lung invading the right hilum and mediastinum. She has planned follow up with Dr. Julien Nordmann on 03/30/17 with post-treatment CT Chest.  We will plan to follow closely via correspondence with medical oncology and are happy to see her back as need.  I advised her to call or contact us with any questions or concerns related to her radiotherapy. 2. PCI. The patient had a  pretreatment MRI that was a 1.25T scan, which fortunately didn't reveal any disease. We reviewed the rationale for repeat imaging of the brain once she's off treatment and stabilized from a systemic perspective. We reviewed the rationale for PCI and she is interested in proceeding.  We will schedule a restaging 3T MRI brain to be completed in the next 1-2 weeks and she will follow up in the office to discuss the results and will proceed with CT simulation for treatment planning at that time.  She and her husband will be leaving for [place] on 04/26/17 and will be away for 5 months so she will need to have all treatments completed prior to departure.     Nicholos Johns, PA-C

## 2017-03-16 ENCOUNTER — Encounter: Payer: Self-pay | Admitting: *Deleted

## 2017-03-16 ENCOUNTER — Other Ambulatory Visit: Payer: Self-pay | Admitting: Internal Medicine

## 2017-03-16 NOTE — Progress Notes (Signed)
Oncology Nurse Navigator Documentation  Oncology Nurse Navigator Flowsheets 03/16/2017  Navigator Location [facility]  Navigator Encounter Type Other/Rad Onc, PA-C contacted me today regarding Ms. Tammy Bowers and her next CT.  I contacted authorization coordinator with an update on authorization.  Once I hear, patient can be scheduled before she see's Dr. Julien Nordmann next month. I will update Rad Onc PA  Treatment Phase Treatment  Barriers/Navigation Needs Coordination of Care  Interventions Coordination of Care  Coordination of Care Other  Acuity Level 2  Time Spent with Patient 30

## 2017-03-20 ENCOUNTER — Telehealth: Payer: Self-pay | Admitting: *Deleted

## 2017-03-20 NOTE — Telephone Encounter (Signed)
Called patient to inform of 3T MRI on 03-27-17- arrival time - 1:30 pm @ [facility] ([address] Location), no restrictions on test and her sim appt. on 03-31-17 @ 8 am, @ [facility], lvm for a return call

## 2017-03-21 ENCOUNTER — Other Ambulatory Visit: Payer: BLUE CROSS/BLUE SHIELD

## 2017-03-22 ENCOUNTER — Telehealth: Payer: Self-pay | Admitting: *Deleted

## 2017-03-22 NOTE — Telephone Encounter (Signed)
Oncology Nurse Navigator Documentation  Oncology Nurse Navigator Flowsheets 03/22/2017  Navigator Location [facility]  Navigator Encounter Type Telephone  Telephone Outgoing Call/I contacted authorization coordinator regarding Ms. Rossitto CT Chest.  Scan has been aurthorized. I called central scheduling and obtained date for scan and pre-procedure instruction.  I then called Ms. Croker and updated her on appt for scan on 03/28/17 arrive at [facility] at 7:45 with liquids only before scan. She verbalized understanding of appt and instructions.   Treatment Phase Post-Tx Follow-up  Barriers/Navigation Needs Coordination of Care  Interventions Coordination of Care  Coordination of Care Other;Radiology  Acuity Level 2  Time Spent with Patient 30

## 2017-03-24 ENCOUNTER — Telehealth: Payer: Self-pay | Admitting: Internal Medicine

## 2017-03-24 NOTE — Telephone Encounter (Signed)
Spoke with patient re lab/fu 4/9 and 4/12

## 2017-03-27 ENCOUNTER — Inpatient Hospital Stay: Admission: RE | Admit: 2017-03-27 | Payer: BLUE CROSS/BLUE SHIELD | Source: Ambulatory Visit

## 2017-03-27 ENCOUNTER — Other Ambulatory Visit: Payer: BLUE CROSS/BLUE SHIELD

## 2017-03-28 ENCOUNTER — Telehealth: Payer: Self-pay | Admitting: *Deleted

## 2017-03-28 ENCOUNTER — Encounter (HOSPITAL_COMMUNITY): Payer: Self-pay

## 2017-03-28 ENCOUNTER — Ambulatory Visit (HOSPITAL_COMMUNITY)
Admission: RE | Admit: 2017-03-28 | Discharge: 2017-03-28 | Disposition: A | Discharge status: Home / Self Care | Payer: BLUE CROSS/BLUE SHIELD | Source: Ambulatory Visit | Attending: Oncology | Admitting: Oncology

## 2017-03-28 ENCOUNTER — Telehealth: Payer: Self-pay | Admitting: Urology

## 2017-03-28 DIAGNOSIS — C3491 Malignant neoplasm of unspecified part of right bronchus or lung: Secondary | ICD-10-CM

## 2017-03-28 DIAGNOSIS — R590 Localized enlarged lymph nodes: Secondary | ICD-10-CM | POA: Insufficient documentation

## 2017-03-28 DIAGNOSIS — D3501 Benign neoplasm of right adrenal gland: Secondary | ICD-10-CM | POA: Diagnosis not present

## 2017-03-28 MED ORDER — IOPAMIDOL (ISOVUE-300) INJECTION 61%
INTRAVENOUS | Status: AC
  Filled 2017-03-28: qty 75

## 2017-03-28 MED ORDER — IOPAMIDOL (ISOVUE-300) INJECTION 61%
75.0000 mL | Freq: Once | INTRAVENOUS | Status: AC | PRN
Start: 2017-03-28 — End: 2017-03-28
  Administered 2017-03-28: 75 mL via INTRAVENOUS

## 2017-03-28 NOTE — Telephone Encounter (Signed)
CALLED PATIENT TO INFORM THAT MRI HAS BEEN MOVED TO [facility]  ON 04-05-17 - ARRIVAL TIME - 3:45 PM AND HER SIM WILL BE ON 04-06-17 @ 9 AM @ DR. MANNING'S OFFICE, SPOKE WITH PATIENT AND SHE IS AWARE OF THESE APPTS.

## 2017-03-28 NOTE — Telephone Encounter (Signed)
I called and spoke to the patient's husband, Tammy Bowers to follow up regarding [name]'s "no show" for her scheduled 3T MRI brain at [facility] on 03/27/17.  He indicated that she would be available to reschedule the MRI for Wednesday, 03/29/17, and prefers morning appointment if possible.  I advised that Enid Derry would be calling from our office to reschedule/coordinate the MRI.  We are hoping to have this completed prior to her scheduled [facility] appointment on 03/31/17 as to prevent having to reschedule her SIM and a delay of starting treatments.  She is on a tight time-frame for completing treatments as they are leaving for [place] on 05/06/17 and will be there for 5 months. I have sent a message to Romie Jumper to contact patient to reschedule 3T MRI brain.   Nicholos Johns, PA-C

## 2017-03-30 ENCOUNTER — Encounter: Payer: Self-pay | Admitting: Internal Medicine

## 2017-03-30 ENCOUNTER — Ambulatory Visit (HOSPITAL_BASED_OUTPATIENT_CLINIC_OR_DEPARTMENT_OTHER): Payer: BLUE CROSS/BLUE SHIELD | Admitting: Internal Medicine

## 2017-03-30 VITALS — BP 117/76 | HR 108 | Temp 98.4°F | Resp 18 | Ht 66.0 in | Wt 194.5 lb

## 2017-03-30 DIAGNOSIS — J449 Chronic obstructive pulmonary disease, unspecified: Secondary | ICD-10-CM

## 2017-03-30 DIAGNOSIS — C3411 Malignant neoplasm of upper lobe, right bronchus or lung: Secondary | ICD-10-CM

## 2017-03-30 DIAGNOSIS — C3491 Malignant neoplasm of unspecified part of right bronchus or lung: Secondary | ICD-10-CM

## 2017-03-30 NOTE — Progress Notes (Signed)
[facility] [phone]   [phone]) [phone]  OFFICE PROGRESS NOTE  Lujean Amel, MD [address] [place] [address]  DIAGNOSIS: Limited stage (T2b, N2, M0) small cell lung cancer diagnosed in November 2017 and presented with large right hilar mass with mediastinal invasion and a right lower lobe pulmonary nodule.  PRIOR THERAPY:  Systemic chemotherapy with cisplatin 60 MG/M2 on day 1 and etoposide 120 MG/M2 on days 1, 2 and 3 concurrent with radiation status post 6 cycles.   CURRENT THERAPY:  None.  INTERVAL HISTORY: Tammy Bowers 66 y.o. female returns to the clinic today for follow-up visit accompanied by her husband. The patient is feeling fine today except for fatigue. She tolerated the last 6 cycles of her treatment well with no significant adverse effects except for fatigue secondary to chemotherapy-induced anemia. She denied having any current chest pain, shortness of breath or hemoptysis. She denied having any fever or chills. She has no nausea, vomiting, diarrhea or constipation. She denied having any recent weight loss or night sweats. She is scheduled to have MRI of the brain next week followed by prophylactic cranial irradiation. The patient had repeat CT scan of the chest performed recently and she is here for evaluation and discussion of her scan results.   MEDICAL HISTORY: Past Medical History:  Diagnosis Date  . Adenopathy 10/10/2016   PERICARINAL  . Anemia   . Arthritis   . Asthma   . Cancer ([place])   . COPD (chronic obstructive pulmonary disease) ([place])   . Dyspnea   . Headache    migraines  . Hilar mass 10/10/2016   RIGHT  . History of kidney stones   . Insomnia   . Lung nodules 10/10/2016   RIGHT LOWER LOBE  . Mass of both adrenal glands ([place]) 10/10/2016  . Mood swing ([place])   . Pneumonia   . Substance abuse    TOBACCO  . UNSPECIFIED INFECTION OF BONE ANKLE AND FOOT 10/22/2009   Annotation: left ankle  Qualifier: Diagnosis of  By: Patsy [place] RN, Denise      ALLERGIES:  is allergic to lidocaine.  MEDICATIONS:  Current Outpatient Prescriptions  Medication Sig Dispense Refill  . albuterol (PROVENTIL HFA;VENTOLIN HFA) 108 (90 BASE) MCG/ACT inhaler Inhale 2 puffs into the lungs every 4 (four) hours as needed for wheezing.    Marland Kitchen aspirin 325 MG tablet Take 325 mg by mouth daily.    . CHANTIX 1 MG tablet TK 1 T PO BID  1  . Fluticasone-Salmeterol (ADVAIR) 500-50 MCG/DOSE AEPB Inhale 1 puff into the lungs 2 (two) times daily.    Marland Kitchen LORazepam (ATIVAN) 0.5 MG tablet Take 1 tablet (0.5 mg total) by mouth at bedtime. 20 tablet 0  . Misc Natural Products (GLUCOSAMINE CHOND COMPLEX/MSM PO) Take 1 tablet by mouth every other day.    . Multiple Vitamin (MULTIVITAMIN WITH MINERALS) TABS tablet Take 1 tablet by mouth every other day. Women's Multivitamin     . Omega-3 Fatty Acids (FISH OIL) 1000 MG CAPS Take 1,000 mg by mouth every other day.    Marland Kitchen OVER THE COUNTER MEDICATION Take 1 mL by mouth 2 (two) times daily. CBD Extract    . prochlorperazine (COMPAZINE) 10 MG tablet TAKE 1 TABLET BY MOUTH EVERY 6 HOURS AS NEEDED FOR NAUSEA AND VOMITING 30 tablet 0  . Cyclobenzaprine HCl (FLEXERIL PO) Take 5 mg by mouth 3 (three) times daily as needed.    Marland Kitchen HYDROcodone-acetaminophen (  NORCO/VICODIN) 5-325 MG tablet Take 1 tablet by mouth 2 (two) times daily as needed.    . nicotine (NICODERM CQ) 14 mg/24hr patch Place 1 patch (14 mg total) onto the skin daily. (Patient not taking: Reported on 03/15/2017) 28 patch 0   No current facility-administered medications for this visit.     SURGICAL HISTORY:  Past Surgical History:  Procedure Laterality Date  . ANKLE SURGERY    . APPENDECTOMY    . LUNG BIOPSY N/A 10/19/2016   Procedure: LUNG BIOPSY;  Surgeon: Grace Isaac, MD;  Location: [facility];  Service: Thoracic;  Laterality: N/A;  . TONSILLECTOMY    . VIDEO BRONCHOSCOPY WITH ENDOBRONCHIAL ULTRASOUND N/A 10/19/2016    Procedure: VIDEO BRONCHOSCOPY WITH ENDOBRONCHIAL ULTRASOUND;  Surgeon: Grace Isaac, MD;  Location: [facility];  Service: Thoracic;  Laterality: N/A;    REVIEW OF SYSTEMS:  Constitutional: positive for fatigue Eyes: negative Ears, nose, mouth, throat, and face: negative Respiratory: positive for cough Cardiovascular: negative Gastrointestinal: negative Genitourinary:negative Integument/breast: negative Hematologic/lymphatic: negative Musculoskeletal:negative Neurological: negative Behavioral/Psych: negative Endocrine: negative Allergic/Immunologic: negative   PHYSICAL EXAMINATION: General appearance: alert, cooperative, fatigued and no distress Head: Normocephalic, without obvious abnormality, atraumatic Neck: no adenopathy, no JVD, supple, symmetrical, trachea midline and thyroid not enlarged, symmetric, no tenderness/mass/nodules Lymph nodes: Cervical, supraclavicular, and axillary nodes normal. Resp: wheezes bilaterally Back: symmetric, no curvature. ROM normal. No CVA tenderness. Cardio: regular rate and rhythm, S1, S2 normal, no murmur, click, rub or gallop GI: soft, non-tender; bowel sounds normal; no masses,  no organomegaly Extremities: extremities normal, atraumatic, no cyanosis or edema Neurologic: Alert and oriented X 3, normal strength and tone. Normal symmetric reflexes. Normal coordination and gait  ECOG PERFORMANCE STATUS: 1 - Symptomatic but completely ambulatory  Blood pressure 117/76, pulse (!) 108, temperature 98.4 F (36.9 C), temperature source Oral, resp. rate 18, height '5\' 6"'$  (1.676 m), weight 194 lb 8 oz (88.2 kg), SpO2 99 %.  LABORATORY DATA: Lab Results  Component Value Date   WBC 7.6 03/06/2017   HGB 10.3 (L) 03/06/2017   HCT 30.0 (L) 03/06/2017   MCV 93.5 03/06/2017   PLT 337 03/06/2017      Chemistry      Component Value Date/Time   NA 139 03/06/2017 0851   K 4.5 03/06/2017 0851   CL 106 11/21/2016 0427   CO2 24 03/06/2017 0851   BUN  31.7 (H) 03/06/2017 0851   CREATININE 1.2 (H) 03/06/2017 0851      Component Value Date/Time   CALCIUM 10.1 03/06/2017 0851   ALKPHOS 113 03/06/2017 0851   AST 23 03/06/2017 0851   ALT 19 03/06/2017 0851   BILITOT <0.22 03/06/2017 0851       RADIOGRAPHIC STUDIES: Ct Chest W Contrast  Result Date: 03/28/2017 CLINICAL DATA:  Right lung cancer status post chemo radiation EXAM: CT CHEST WITH CONTRAST TECHNIQUE: Multidetector CT imaging of the chest was performed during intravenous contrast administration. CONTRAST:  12m ISOVUE-300 IOPAMIDOL (ISOVUE-300) INJECTION 61% COMPARISON:  01/25/2017 FINDINGS: Cardiovascular: The heart size is normal. No pericardial effusion. Atherosclerotic calcification is noted in the wall of the thoracic aorta. Mediastinum/Nodes: 1.3 cm short axis lymph node characterize as low right paratracheal on the prior study has decreased to 11 mm short axis today. 8 mm subcarinal lymph node measured previously is stable at 8 mm. Right infrahilar soft tissue fullness persists. Lesion measured posterior to the distal bronchus intermedius previously a 2.2 x 1.6 cm now measures 1.7 x 1.3 cm. The soft  tissue described previously as surrounding the bronchus intermedius measures 2.9 cm short axis today compared to 3.0 cm previously. Qualitatively, the bulk of the soft tissue appears decreased on today's study to a greater degree than the long axis measurement suggest. Lungs/Pleura: Right middle lobe granuloma again noted. Emphysema is evident with scattered areas of architectural distortion/scarring. Further improved aeration right middle lobe although some residual subsegmental atelectasis is evident. No pleural effusion. Upper Abdomen: Right adrenal nodule stable at 3.5 x 2.8 cm left adrenal gland unremarkable. Musculoskeletal: Bone windows reveal no worrisome lytic or sclerotic osseous lesions. IMPRESSION: 1. Continued slight improvement in mediastinal lymphadenopathy and right hilar  disease. No new or progressive findings on today's study. 2. Stable right adrenal adenoma. 3. Continued improvement in aeration right middle lobe. Electronically Signed   By: Misty Stanley M.D.   On: 03/28/2017 09:45    ASSESSMENT AND PLAN:  This is a very pleasant 66 years old white female with limited stage small cell lung cancer status post a course of systemic chemotherapy with cisplatin and etoposide for 6 cycles concurrent with radiation. She tolerated her treatment well except for fatigue. The patient had repeat CT scan of the chest performed recently. I personally and independently reviewed the scan images and discussed the results and showed the images to the patient and her husband today. Her scan showed further improvement of her disease. I recommended for the patient to proceed with prophylactic cranial irradiation at this point. She is scheduled to have MRI of the brain before the radiotherapy to the brain. I also recommended for the patient to continue on observation for now with repeat CT scan of the chest in 3 months which will be done in [place] where the patient will be vacationing for the next 5 months. I will see her back for follow-up visit in 6 months for evaluation and repeat CT scan of the chest. For pain management the patient will continue her current treatment with Vicodin. For COPD, I advised the patient to continue her current treatment with Advair and albuterol inhaler. She was advised to call immediately if she has any concerning symptoms in the interval. The patient voices understanding of current disease status and treatment options and is in agreement with the current care plan.  All questions were answered. The patient knows to call the clinic with any problems, questions or concerns. We can certainly see the patient much sooner if necessary.  Disclaimer: This note was dictated with voice recognition software. Similar sounding words can inadvertently be transcribed  and may not be corrected upon review.

## 2017-03-31 ENCOUNTER — Telehealth: Payer: Self-pay | Admitting: Internal Medicine

## 2017-03-31 ENCOUNTER — Ambulatory Visit: Payer: BLUE CROSS/BLUE SHIELD | Admitting: Radiation Oncology

## 2017-03-31 NOTE — Telephone Encounter (Signed)
Scheduled appt per 4/12 los. Sent out appt reminder letter.

## 2017-04-05 ENCOUNTER — Ambulatory Visit (HOSPITAL_COMMUNITY)
Admission: RE | Admit: 2017-04-05 | Discharge: 2017-04-05 | Disposition: A | Discharge status: Home / Self Care | Payer: BLUE CROSS/BLUE SHIELD | Source: Ambulatory Visit | Attending: Urology | Admitting: Urology

## 2017-04-05 ENCOUNTER — Telehealth: Payer: Self-pay | Admitting: Radiation Oncology

## 2017-04-05 DIAGNOSIS — C3491 Malignant neoplasm of unspecified part of right bronchus or lung: Secondary | ICD-10-CM

## 2017-04-05 MED ORDER — GADOBENATE DIMEGLUMINE 529 MG/ML IV SOLN
20.0000 mL | Freq: Once | INTRAVENOUS | Status: AC
  Administered 2017-04-05: 19 mL via INTRAVENOUS

## 2017-04-05 NOTE — Telephone Encounter (Signed)
Phoned patient to remind her of MRI today at [facility] at 4 pm. Stressed that MRI has to be done today for simulation tomorrow and ultimately completion in time for her to travel to [place]. Patient confirms she will be present for MRI today and expressed appreciation for the call.

## 2017-04-06 ENCOUNTER — Telehealth: Payer: Self-pay | Admitting: Urology

## 2017-04-06 ENCOUNTER — Ambulatory Visit
Admission: RE | Admit: 2017-04-06 | Discharge: 2017-04-06 | Disposition: A | Discharge status: Home / Self Care | Payer: BLUE CROSS/BLUE SHIELD | Source: Ambulatory Visit | Attending: Radiation Oncology | Admitting: Radiation Oncology

## 2017-04-06 DIAGNOSIS — Z510 Encounter for antineoplastic radiation therapy: Secondary | ICD-10-CM | POA: Diagnosis not present

## 2017-04-06 DIAGNOSIS — C3491 Malignant neoplasm of unspecified part of right bronchus or lung: Secondary | ICD-10-CM | POA: Insufficient documentation

## 2017-04-06 NOTE — Progress Notes (Signed)
  Radiation Oncology         [phone] ________________________________  Name: Katriel Cutsforth MRN: [id number]  Date: 04/06/2017  DOB: 1951/03/28  SIMULATION AND TREATMENT PLANNING NOTE    ICD-9-CM ICD-10-CM   1. Small cell lung carcinoma, right (HCC) 162.9 C34.91     DIAGNOSIS: Limited stage T2bN2M0 small cell carcinoma of the right upper lobe of the lung invading the right hilum and mediastinum.  NARRATIVE:  The patient was brought to the [facility].  Identity was confirmed.  All relevant records and images related to the planned course of therapy were reviewed.  The patient freely provided informed written consent to proceed with treatment after reviewing the details related to the planned course of therapy. The consent form was witnessed and verified by the simulation staff.  Then, the patient was set-up in a stable reproducible  supine position for radiation therapy.  CT images were obtained.  Surface markings were placed.  The CT images were loaded into the planning software.  Then the target and avoidance structures were contoured.  Treatment planning then occurred.  The radiation prescription was entered and confirmed.  Then, I designed and supervised the construction of a total of 3 medically necessary complex treatment devices, including a custom made thermoplastic mask used for immobilization and two complex multileaf collimators to cover the entire intracranial contents, while shielding the eyes and face.  Each [facility] is independently created to account for beam divergence.  The right and left lateral fields will be treated with 6 MV X-rays.  I have requested : Isodose Plan.    PLAN:  The whole brain will be treated to 25 Gy in 10 fractions for prophylactic cranial irradiation.  ________________________________  Sheral Apley Tammi Klippel, M.D.  This document serves as a record of services personally performed by [name], MD. It was created on his behalf by Bethann Humble,  a trained medical scribe. The creation of this record is based on the scribe's personal observations and the provider's statements to them. This document has been checked and approved by the attending provider.

## 2017-04-06 NOTE — Telephone Encounter (Signed)
Pateint informed of MRI results.  No evidence of metatstatic disease on MRI.

## 2017-04-10 ENCOUNTER — Ambulatory Visit: Payer: BLUE CROSS/BLUE SHIELD | Admitting: Radiation Oncology

## 2017-04-10 DIAGNOSIS — Z510 Encounter for antineoplastic radiation therapy: Secondary | ICD-10-CM | POA: Diagnosis not present

## 2017-04-11 ENCOUNTER — Ambulatory Visit
Admission: RE | Admit: 2017-04-11 | Discharge: 2017-04-11 | Disposition: A | Discharge status: Home / Self Care | Payer: BLUE CROSS/BLUE SHIELD | Source: Ambulatory Visit | Attending: Radiation Oncology | Admitting: Radiation Oncology

## 2017-04-11 ENCOUNTER — Other Ambulatory Visit: Payer: BLUE CROSS/BLUE SHIELD

## 2017-04-11 DIAGNOSIS — Z510 Encounter for antineoplastic radiation therapy: Secondary | ICD-10-CM | POA: Diagnosis not present

## 2017-04-12 ENCOUNTER — Ambulatory Visit
Admission: RE | Admit: 2017-04-12 | Discharge: 2017-04-12 | Disposition: A | Discharge status: Home / Self Care | Payer: BLUE CROSS/BLUE SHIELD | Source: Ambulatory Visit | Attending: Radiation Oncology | Admitting: Radiation Oncology

## 2017-04-12 DIAGNOSIS — Z510 Encounter for antineoplastic radiation therapy: Secondary | ICD-10-CM | POA: Diagnosis not present

## 2017-04-13 ENCOUNTER — Ambulatory Visit
Admission: RE | Admit: 2017-04-13 | Discharge: 2017-04-13 | Disposition: A | Discharge status: Home / Self Care | Payer: BLUE CROSS/BLUE SHIELD | Source: Ambulatory Visit | Attending: Radiation Oncology | Admitting: Radiation Oncology

## 2017-04-13 DIAGNOSIS — Z510 Encounter for antineoplastic radiation therapy: Secondary | ICD-10-CM | POA: Diagnosis not present

## 2017-04-14 ENCOUNTER — Ambulatory Visit
Admission: RE | Admit: 2017-04-14 | Discharge: 2017-04-14 | Disposition: A | Discharge status: Home / Self Care | Payer: BLUE CROSS/BLUE SHIELD | Source: Ambulatory Visit | Attending: Radiation Oncology | Admitting: Radiation Oncology

## 2017-04-14 DIAGNOSIS — Z510 Encounter for antineoplastic radiation therapy: Secondary | ICD-10-CM | POA: Diagnosis not present

## 2017-04-16 ENCOUNTER — Other Ambulatory Visit: Payer: Self-pay | Admitting: Internal Medicine

## 2017-04-17 ENCOUNTER — Ambulatory Visit
Admission: RE | Admit: 2017-04-17 | Discharge: 2017-04-17 | Disposition: A | Discharge status: Home / Self Care | Payer: BLUE CROSS/BLUE SHIELD | Source: Ambulatory Visit | Attending: Radiation Oncology | Admitting: Radiation Oncology

## 2017-04-17 DIAGNOSIS — Z510 Encounter for antineoplastic radiation therapy: Secondary | ICD-10-CM | POA: Diagnosis not present

## 2017-04-18 ENCOUNTER — Ambulatory Visit
Admission: RE | Admit: 2017-04-18 | Discharge: 2017-04-18 | Disposition: A | Discharge status: Home / Self Care | Payer: BLUE CROSS/BLUE SHIELD | Source: Ambulatory Visit | Attending: Radiation Oncology | Admitting: Radiation Oncology

## 2017-04-18 DIAGNOSIS — Z510 Encounter for antineoplastic radiation therapy: Secondary | ICD-10-CM | POA: Diagnosis not present

## 2017-04-19 ENCOUNTER — Ambulatory Visit: Payer: BLUE CROSS/BLUE SHIELD

## 2017-04-19 ENCOUNTER — Ambulatory Visit
Admission: RE | Admit: 2017-04-19 | Discharge: 2017-04-19 | Disposition: A | Discharge status: Home / Self Care | Payer: BLUE CROSS/BLUE SHIELD | Source: Ambulatory Visit | Attending: Radiation Oncology | Admitting: Radiation Oncology

## 2017-04-19 DIAGNOSIS — Z510 Encounter for antineoplastic radiation therapy: Secondary | ICD-10-CM | POA: Diagnosis not present

## 2017-04-20 ENCOUNTER — Ambulatory Visit
Admission: RE | Admit: 2017-04-20 | Discharge: 2017-04-20 | Disposition: A | Discharge status: Home / Self Care | Payer: BLUE CROSS/BLUE SHIELD | Source: Ambulatory Visit | Attending: Radiation Oncology | Admitting: Radiation Oncology

## 2017-04-20 ENCOUNTER — Ambulatory Visit: Payer: BLUE CROSS/BLUE SHIELD

## 2017-04-20 ENCOUNTER — Other Ambulatory Visit: Payer: Self-pay | Admitting: Radiation Oncology

## 2017-04-20 DIAGNOSIS — Z510 Encounter for antineoplastic radiation therapy: Secondary | ICD-10-CM | POA: Diagnosis not present

## 2017-04-20 DIAGNOSIS — C3491 Malignant neoplasm of unspecified part of right bronchus or lung: Secondary | ICD-10-CM

## 2017-04-20 MED ORDER — DEXAMETHASONE 4 MG PO TABS: 2.0000 mg | ORAL_TABLET | Freq: Two times a day (BID) | ORAL | 0 refills | Status: DC

## 2017-04-21 ENCOUNTER — Ambulatory Visit
Admission: RE | Admit: 2017-04-21 | Discharge: 2017-04-21 | Disposition: A | Discharge status: Home / Self Care | Payer: BLUE CROSS/BLUE SHIELD | Source: Ambulatory Visit | Attending: Radiation Oncology | Admitting: Radiation Oncology

## 2017-04-21 ENCOUNTER — Encounter: Payer: Self-pay | Admitting: Radiation Oncology

## 2017-04-21 ENCOUNTER — Ambulatory Visit: Payer: BLUE CROSS/BLUE SHIELD

## 2017-04-21 DIAGNOSIS — Z510 Encounter for antineoplastic radiation therapy: Secondary | ICD-10-CM | POA: Diagnosis not present

## 2017-04-24 ENCOUNTER — Ambulatory Visit: Admission: RE | Admit: 2017-04-24 | Payer: BLUE CROSS/BLUE SHIELD | Source: Ambulatory Visit

## 2017-04-28 NOTE — Progress Notes (Signed)
  Radiation Oncology         [phone] ________________________________  Name: Randye Treichler MRN: [id number]  Date: 04/21/2017  DOB: 1951-11-19  End of Treatment Note  Diagnosis:   66 y.o. woman with limited stage T2bN2M0 small cell carcinoma of the right upper lobe of the lung invading the right hilum and mediastinum  Indication for treatment:  Prophylactic brain irradiation  Radiation treatment dates:  04/11/2017 - 04/21/2017  Site/dose:   The whole brain was treated to 25 Gy in 10 fractions of 2.5 Gy  Beams/energy:   Right and Left radiation fields were treated using 6 MV X-rays with custom MLC collimation to shield the eyes and face.  The patient was immobilized with a thermoplastic mask and isocenter was verified with weekly port films.  Narrative: The patient tolerated radiation treatment relatively well. The patient had headaches for which she managed with motrin and vicodin, blurred vision since starting chemotherapy, ringing in the ears, nausea, increased thirst, fatigue. I prescribed Decadron for the patient's likely cerebral edema that was causing her symptoms. She had treatment twice on 04/21/17.  Plan: The patient has completed radiation treatment. The patient was not provided with a follow up appointment given she will be in [place] for the next 5 months. She should notify us if she has any questions or concerns.  ________________________________  Sheral Apley Tammi Klippel, M.D.  This document serves as a record of services personally performed by [name], MD. It was created on his behalf by Darcus Austin, a trained medical scribe. The creation of this record is based on the scribe's personal observations and the provider's statements to them. This document has been checked and approved by the attending provider.

## 2017-05-02 ENCOUNTER — Telehealth: Payer: Self-pay | Admitting: Radiation Oncology

## 2017-05-02 DIAGNOSIS — C3491 Malignant neoplasm of unspecified part of right bronchus or lung: Secondary | ICD-10-CM

## 2017-05-02 MED ORDER — DEXAMETHASONE 4 MG PO TABS: ORAL_TABLET | ORAL | 1 refills | Status: DC

## 2017-05-02 NOTE — Telephone Encounter (Signed)
Received call from patient inquiring about taking " a whole decadron tablet instead of a half." Patient reported when she tapered down to a 1/2 tablet she began to have ringing in the ears, tingling, blurry vision and headache again. Phoned patient back to provide her with taper instructions per Shona Simpson, PA-C. No answer. Left a detailed message and requested she phone back to confirm receipt.   Decadron taper below: Decadron 4 mg bid for 7 days then, 4 mg in the morning and 2 mg in the afternoon for 7 days then, 2 mg bid for 7 days then, 2 mg in the morning and 1 mg in the afternoon for seven days then, 1 mg in the morning and 1 mg in the afternoon for 7 days then, stop.

## 2017-05-02 NOTE — Telephone Encounter (Signed)
Attempting to reach patient again to review decadron taper. No answer. Left another detailed message.

## 2017-05-03 NOTE — Telephone Encounter (Signed)
Received voicemail message from patient after hours confirming she listened to voicemail and understands decadron taper.

## 2017-05-15 ENCOUNTER — Other Ambulatory Visit: Payer: Self-pay | Admitting: Radiation Oncology

## 2017-05-15 ENCOUNTER — Other Ambulatory Visit: Payer: Self-pay | Admitting: Internal Medicine

## 2017-05-15 DIAGNOSIS — C3491 Malignant neoplasm of unspecified part of right bronchus or lung: Secondary | ICD-10-CM

## 2017-05-16 ENCOUNTER — Telehealth: Payer: Self-pay | Admitting: Radiation Oncology

## 2017-05-16 NOTE — Telephone Encounter (Signed)
Received inbasket message requesting refill of patient's decadron. Phoned patient who reports her neurologic symptoms have almost completely resolved. Reports she is at the two week mark of the taper and taking decadron 4 mg in the AM and 2 mg in the PM. She understands a refill will be called to the [facility] in [place], [place]. Patient understands to contact this RN with future needs.

## 2017-05-16 NOTE — Telephone Encounter (Signed)
Called in decadron refill to [facility], [place], [place] as requested by the patient.

## 2017-05-18 ENCOUNTER — Other Ambulatory Visit: Payer: Self-pay | Admitting: Urology

## 2017-05-18 ENCOUNTER — Telehealth: Payer: Self-pay

## 2017-05-18 ENCOUNTER — Telehealth: Payer: Self-pay | Admitting: Radiation Oncology

## 2017-05-18 DIAGNOSIS — B370 Candidal stomatitis: Secondary | ICD-10-CM | POA: Insufficient documentation

## 2017-05-18 MED ORDER — FLUCONAZOLE 100 MG PO TABS: 100.0000 mg | ORAL_TABLET | Freq: Every day | ORAL | 1 refills | Status: DC

## 2017-05-18 NOTE — Telephone Encounter (Signed)
Pt is c/o thrush, she has white pieces on her tongue. She has used salt water and some coconut oil. It has been a little over a week and now going into her throat. This is her 1st time ever thrush.  Her last etoposide was 3/21 Her last brain XRT was 5/7  Please send Rx to [facility] in [place]

## 2017-05-18 NOTE — Progress Notes (Signed)
Patient called with report of white spots on her tongue.  She has used salt water and some coconut oil. It has been a little over a week and now going into her throat. This is her 1st time ever thrush.  Her last etoposide was 3/21 Her last brain XRT was 5/7 She is currently in [place] and requests her prescription be sent to the [facility] nearby for which she has provided the contact information. Escript sent for Difulcan 100mg  with instructions to take 200mg  on day 1 followed by 100mg  po qd thereafter for a total of 7 days.

## 2017-05-18 NOTE — Telephone Encounter (Signed)
I have sent the Escript for Difulcan to the [facility] in [place].  Please notify patient that she can pick this up today and begin treatment immediately.

## 2017-05-18 NOTE — Telephone Encounter (Signed)
Phoned patient on her cell. Explained her Diflucan script has been called in and should be ready for pick up. Patient verbalized understanding and expressed appreciation for the call.

## 2017-05-30 ENCOUNTER — Ambulatory Visit: Payer: Self-pay | Admitting: Urology

## 2017-09-26 ENCOUNTER — Telehealth: Payer: Self-pay | Admitting: Internal Medicine

## 2017-09-26 NOTE — Telephone Encounter (Signed)
Tried to reach patient per 10/9 sch message to r/s appts - unable to reach her . Left message saying we tried to call to r/s and number to call back .

## 2017-09-27 ENCOUNTER — Other Ambulatory Visit: Payer: BLUE CROSS/BLUE SHIELD

## 2017-10-02 ENCOUNTER — Ambulatory Visit: Payer: BLUE CROSS/BLUE SHIELD | Admitting: Internal Medicine

## 2017-10-06 ENCOUNTER — Telehealth: Payer: Self-pay | Admitting: Internal Medicine

## 2017-10-06 NOTE — Telephone Encounter (Signed)
Unable to schedule appt per sch message 10/17 - patient said she will not be back in town 10/22 so lab and CT will have to be r/s - she said she will contact [facility] and give [place] a call to add lab appt when r/s'd.

## 2017-10-09 ENCOUNTER — Ambulatory Visit (HOSPITAL_COMMUNITY): Admission: RE | Admit: 2017-10-09 | Payer: BLUE CROSS/BLUE SHIELD | Source: Ambulatory Visit

## 2017-10-10 ENCOUNTER — Telehealth: Payer: Self-pay | Admitting: Internal Medicine

## 2017-10-10 NOTE — Telephone Encounter (Signed)
Spoke with patient regarding lab appts that was rescheduled per 10/23 sch msg.

## 2017-10-11 ENCOUNTER — Encounter (HOSPITAL_COMMUNITY): Payer: Self-pay

## 2017-10-11 ENCOUNTER — Other Ambulatory Visit (HOSPITAL_BASED_OUTPATIENT_CLINIC_OR_DEPARTMENT_OTHER): Payer: BLUE CROSS/BLUE SHIELD

## 2017-10-11 ENCOUNTER — Ambulatory Visit (HOSPITAL_COMMUNITY)
Admission: RE | Admit: 2017-10-11 | Discharge: 2017-10-11 | Disposition: A | Discharge status: Home / Self Care | Payer: BLUE CROSS/BLUE SHIELD | Source: Ambulatory Visit | Attending: Internal Medicine | Admitting: Internal Medicine

## 2017-10-11 DIAGNOSIS — C3491 Malignant neoplasm of unspecified part of right bronchus or lung: Secondary | ICD-10-CM | POA: Diagnosis present

## 2017-10-11 DIAGNOSIS — R918 Other nonspecific abnormal finding of lung field: Secondary | ICD-10-CM | POA: Insufficient documentation

## 2017-10-11 DIAGNOSIS — J449 Chronic obstructive pulmonary disease, unspecified: Secondary | ICD-10-CM

## 2017-10-11 DIAGNOSIS — C3411 Malignant neoplasm of upper lobe, right bronchus or lung: Secondary | ICD-10-CM

## 2017-10-11 LAB — COMPREHENSIVE METABOLIC PANEL
ALT: 10 U/L (ref 0–55)
AST: 16 U/L (ref 5–34)
Albumin: 3.6 g/dL (ref 3.5–5.0)
Alkaline Phosphatase: 85 U/L (ref 40–150)
Anion Gap: 8 mEq/L (ref 3–11)
BUN: 24.2 mg/dL (ref 7.0–26.0)
CO2: 28 mEq/L (ref 22–29)
Calcium: 9.5 mg/dL (ref 8.4–10.4)
Chloride: 108 mEq/L (ref 98–109)
Creatinine: 1.3 mg/dL — ABNORMAL HIGH (ref 0.6–1.1)
EGFR: 43 mL/min/{1.73_m2} — ABNORMAL LOW (ref >60)
Glucose: 81 mg/dl (ref 70–140)
Potassium: 4.7 mEq/L (ref 3.5–5.1)
Sodium: 144 mEq/L (ref 136–145)
Total Bilirubin: 0.22 mg/dL (ref 0.20–1.20)
Total Protein: 6.3 g/dL — ABNORMAL LOW (ref 6.4–8.3)

## 2017-10-11 LAB — CBC WITH DIFFERENTIAL/PLATELET
BASO%: 1.2 % (ref 0.0–2.0)
Basophils Absolute: 0.1 10*3/uL (ref 0.0–0.1)
EOS%: 3.3 % (ref 0.0–7.0)
Eosinophils Absolute: 0.2 10*3/uL (ref 0.0–0.5)
HCT: 36.6 % (ref 34.8–46.6)
HGB: 12.3 g/dL (ref 11.6–15.9)
LYMPH%: 14.8 % (ref 14.0–49.7)
MCH: 31.3 pg (ref 25.1–34.0)
MCHC: 33.5 g/dL (ref 31.5–36.0)
MCV: 93.5 fL (ref 79.5–101.0)
MONO#: 0.5 10*3/uL (ref 0.1–0.9)
MONO%: 9.5 % (ref 0.0–14.0)
NEUT#: 3.4 10*3/uL (ref 1.5–6.5)
NEUT%: 71.2 % (ref 38.4–76.8)
Platelets: 259 10*3/uL (ref 145–400)
RBC: 3.92 10*6/uL (ref 3.70–5.45)
RDW: 12.5 % (ref 11.2–14.5)
WBC: 4.8 10*3/uL (ref 3.9–10.3)
lymph#: 0.7 10*3/uL — ABNORMAL LOW (ref 0.9–3.3)

## 2017-10-11 MED ORDER — IOPAMIDOL (ISOVUE-300) INJECTION 61%
INTRAVENOUS | Status: AC
  Administered 2017-10-11: 60 mL via INTRAVENOUS
  Filled 2017-10-11: qty 75

## 2017-10-11 MED ORDER — IOPAMIDOL (ISOVUE-300) INJECTION 61%
75.0000 mL | Freq: Once | INTRAVENOUS | Status: AC | PRN
  Administered 2017-10-11: 60 mL via INTRAVENOUS

## 2017-10-17 ENCOUNTER — Ambulatory Visit (HOSPITAL_BASED_OUTPATIENT_CLINIC_OR_DEPARTMENT_OTHER): Payer: BLUE CROSS/BLUE SHIELD | Admitting: Internal Medicine

## 2017-10-17 ENCOUNTER — Telehealth: Payer: Self-pay

## 2017-10-17 ENCOUNTER — Encounter: Payer: Self-pay | Admitting: Internal Medicine

## 2017-10-17 ENCOUNTER — Other Ambulatory Visit: Payer: BLUE CROSS/BLUE SHIELD

## 2017-10-17 VITALS — BP 93/57 | HR 89 | Temp 98.4°F | Resp 20 | Ht 66.0 in | Wt 190.5 lb

## 2017-10-17 DIAGNOSIS — C3491 Malignant neoplasm of unspecified part of right bronchus or lung: Secondary | ICD-10-CM

## 2017-10-17 DIAGNOSIS — R5383 Other fatigue: Secondary | ICD-10-CM | POA: Diagnosis not present

## 2017-10-17 DIAGNOSIS — J449 Chronic obstructive pulmonary disease, unspecified: Secondary | ICD-10-CM

## 2017-10-17 DIAGNOSIS — C3490 Malignant neoplasm of unspecified part of unspecified bronchus or lung: Secondary | ICD-10-CM

## 2017-10-17 DIAGNOSIS — C3411 Malignant neoplasm of upper lobe, right bronchus or lung: Secondary | ICD-10-CM | POA: Diagnosis not present

## 2017-10-17 MED ORDER — LEVOFLOXACIN 500 MG PO TABS: 500.0000 mg | ORAL_TABLET | Freq: Every day | ORAL | 0 refills | Status: DC

## 2017-10-17 NOTE — Progress Notes (Signed)
[facility] Telephone:(336) [phone]   Fax:(336) [place], [place] [address]  [phone]  DIAGNOSIS: Limited stage (T2b, N2, M0) small cell lung cancer diagnosed in November 2017 and presented with large right hilar mass with mediastinal invasion and a right lower lobe pulmonary nodule.  PRIOR THERAPY:  Systemic chemotherapy with cisplatin 60 MG/M2 on day 1 and etoposide 120 MG/M2 on days 1, 2 and 3 concurrent with radiation status post 6 cycles.   CURRENT THERAPY:  None.  INTERVAL HISTORY: Tammy Bowers 66 y.o. female returns to the clinic today for follow-up visit accompanied by her husband. The patient is feeling fine today with no specific complaints except for mild cough and chest congestion. Several family members that were sick recently. She denied having any chest pain, shortness breath or hemoptysis. She denied having any current fever or chills. She has no nausea, vomiting, diarrhea or constipation. She has been on observation for the last few months. She had repeat CT scan of the chest performed recently and she is here for evaluation and discussion of her scan results.   MEDICAL HISTORY: Past Medical History:  Diagnosis Date  . Adenopathy 10/10/2016   PERICARINAL  . Anemia   . Arthritis   . Asthma   . Cancer ([place])   . COPD (chronic obstructive pulmonary disease) ([place])   . Dyspnea   . Headache    migraines  . Hilar mass 10/10/2016   RIGHT  . History of kidney stones   . Insomnia   . Lung nodules 10/10/2016   RIGHT LOWER LOBE  . Mass of both adrenal glands ([place]) 10/10/2016  . Mood swing   . Pneumonia   . Substance abuse ([place])    TOBACCO  . UNSPECIFIED INFECTION OF BONE ANKLE AND FOOT 10/22/2009   Annotation: left ankle Qualifier: Diagnosis of  By: Patsy Baltimore RN, Denise      ALLERGIES:  is allergic to lidocaine.  MEDICATIONS:  Current Outpatient Prescriptions  Medication Sig  Dispense Refill  . albuterol (PROVENTIL HFA;VENTOLIN HFA) 108 (90 BASE) MCG/ACT inhaler Inhale 2 puffs into the lungs every 4 (four) hours as needed for wheezing.    Marland Kitchen aspirin 325 MG tablet Take 325 mg by mouth daily.    . CHANTIX 1 MG tablet TK 1 T PO BID  1  . Cyclobenzaprine HCl (FLEXERIL PO) Take 5 mg by mouth 3 (three) times daily as needed.    Marland Kitchen dexamethasone (DECADRON) 4 MG tablet Take 4 mg by mouth twice a day for seven days then, 4 mg in the morning and 2 mg in the afternoon for seven days then, 2 mg twice per day for seven days then, 2 mg in the AM and 1 mg in the afternoon for seven days then, I mg in the morning and 1 mg in the afternoon for seven days then, STOP 120 tablet 1  . fluconazole (DIFLUCAN) 100 MG tablet Take 1 tablet (100 mg total) by mouth daily. Take 2 tablets on day 1 and then 1 tablet daily thereafter for a total of 7 days treatment. 8 tablet 1  . Fluticasone-Salmeterol (ADVAIR) 500-50 MCG/DOSE AEPB Inhale 1 puff into the lungs 2 (two) times daily.    Marland Kitchen HYDROcodone-acetaminophen (NORCO/VICODIN) 5-325 MG tablet Take 1 tablet by mouth 2 (two) times daily as needed.    Marland Kitchen LORazepam (ATIVAN) 0.5 MG tablet Take 1 tablet (0.5 mg total) by mouth at  bedtime. 20 tablet 0  . Misc Natural Products (GLUCOSAMINE CHOND COMPLEX/MSM PO) Take 1 tablet by mouth every other day.    . Multiple Vitamin (MULTIVITAMIN WITH MINERALS) TABS tablet Take 1 tablet by mouth every other day. Women's Multivitamin     . nicotine (NICODERM CQ) 14 mg/24hr patch Place 1 patch (14 mg total) onto the skin daily. (Patient not taking: Reported on 03/15/2017) 28 patch 0  . Omega-3 Fatty Acids (FISH OIL) 1000 MG CAPS Take 1,000 mg by mouth every other day.    Marland Kitchen OVER THE COUNTER MEDICATION Take 1 mL by mouth 2 (two) times daily. CBD Extract    . prochlorperazine (COMPAZINE) 10 MG tablet TAKE 1 TABLET BY MOUTH EVERY 6 HOURS AS NEEDED FOR NAUSEA AND VOMITING 30 tablet 0   No current facility-administered medications  for this visit.     SURGICAL HISTORY:  Past Surgical History:  Procedure Laterality Date  . ANKLE SURGERY    . APPENDECTOMY    . LUNG BIOPSY N/A 10/19/2016   Procedure: LUNG BIOPSY;  Surgeon: Grace Isaac, MD;  Location: [facility];  Service: Thoracic;  Laterality: N/A;  . TONSILLECTOMY    . VIDEO BRONCHOSCOPY WITH ENDOBRONCHIAL ULTRASOUND N/A 10/19/2016   Procedure: VIDEO BRONCHOSCOPY WITH ENDOBRONCHIAL ULTRASOUND;  Surgeon: Grace Isaac, MD;  Location: [facility];  Service: Thoracic;  Laterality: N/A;    REVIEW OF SYSTEMS:  A comprehensive review of systems was negative except for: Constitutional: positive for fatigue Respiratory: positive for cough   PHYSICAL EXAMINATION: General appearance: alert, cooperative, fatigued and no distress Head: Normocephalic, without obvious abnormality, atraumatic Neck: no adenopathy, no JVD, supple, symmetrical, trachea midline and thyroid not enlarged, symmetric, no tenderness/mass/nodules Lymph nodes: Cervical, supraclavicular, and axillary nodes normal. Resp: wheezes bilaterally Back: symmetric, no curvature. ROM normal. No CVA tenderness. Cardio: regular rate and rhythm, S1, S2 normal, no murmur, click, rub or gallop GI: soft, non-tender; bowel sounds normal; no masses,  no organomegaly Extremities: extremities normal, atraumatic, no cyanosis or edema  ECOG PERFORMANCE STATUS: 1 - Symptomatic but completely ambulatory  Blood pressure (!) 93/57, pulse 89, temperature 98.4 F (36.9 C), temperature source Oral, resp. rate 20, height 5\' 6"  (1.676 m), weight 190 lb 8 oz (86.4 kg), SpO2 97 %.  LABORATORY DATA: Lab Results  Component Value Date   WBC 4.8 10/11/2017   HGB 12.3 10/11/2017   HCT 36.6 10/11/2017   MCV 93.5 10/11/2017   PLT 259 10/11/2017      Chemistry      Component Value Date/Time   NA 144 10/11/2017 0827   K 4.7 10/11/2017 0827   CL 106 11/21/2016 0427   CO2 28 10/11/2017 0827   BUN 24.2 10/11/2017 0827   CREATININE 1.3  (H) 10/11/2017 0827      Component Value Date/Time   CALCIUM 9.5 10/11/2017 0827   ALKPHOS 85 10/11/2017 0827   AST 16 10/11/2017 0827   ALT 10 10/11/2017 0827   BILITOT 0.22 10/11/2017 0827       RADIOGRAPHIC STUDIES: Ct Chest W Contrast  Result Date: 10/11/2017 CLINICAL DATA:  RIGHT lung cancer diagnosed 2017. Chemotherapy radiation therapy complete. EXAM: CT CHEST WITH CONTRAST TECHNIQUE: Multidetector CT imaging of the chest was performed during intravenous contrast administration. CONTRAST:  24mL ISOVUE-300 IOPAMIDOL (ISOVUE-300) INJECTION 61% COMPARISON:  CT 03/28/2017, PET-CT 10/21/2016 FINDINGS: Cardiovascular: No significant vascular findings. Normal heart size. No pericardial effusion. Mediastinum/Nodes: No axillary or supraclavicular adenopathy. Small calcified mediastinal lymph nodes again noted. Peribronchial thickening  along the RIGHT lower lobe bronchus is similar prior. Lungs/Pleura: Within the posterior aspect of the RIGHT upper lobe, new nodule measures 19 mm x 17 mm (image 59, series 5). This nodules is anterior to the oblique fissure and is in the vicinity of the primary malignancy on comparison FDG PET scan. LEFT lung is clear. Upper Abdomen: Enlarged RIGHT adrenal gland to 2.4 cm is unchanged. This lesion characterize benign adenoma on comparison FDG PET scan. Musculoskeletal: No aggressive osseous lesion. IMPRESSION: 1. New nodularity in the posterior RIGHT upper lobe along the oblique fissure in the vicinity of prior primary bronchus carcinoma. While lesion could represent a focus of infection, concern for tumor recurrence is significant. Recommend either short-term CT follow-up or bronchoscopy for further evaluation. 2. Stable peribronchial thickening in the mediastinum on the RIGHT. Electronically Signed   By: Suzy Bouchard M.D.   On: 10/11/2017 11:14    ASSESSMENT AND PLAN:  This is a very pleasant 66 years old white female with limited stage small cell lung cancer  status post a course of systemic chemotherapy with cisplatin and etoposide for 6 cycles concurrent with radiation. She tolerated her treatment well except for fatigue. She is also status post prophylactic cranial irradiation. The patient is currently on observation and feeling fine except for the chest congestion and cough. Repeat CT scan of the chest showed new nodularity in the posterior right upper lobe suspicious for inflammatory process but disease recurrence cannot be excluded. I discussed the scan results and showed the images to the patient and her husband. I will start the patient on treatment with Levaquin for 7 days. I will also arrange for her to have repeat CT scan of the chest in 6 weeks for reevaluation of her condition. If no improvement in the right upper lobe lung opacity, I may consider the patient for repeat bronchoscopy or repeat PET scan for further evaluation of her disease. She was advised to call immediately if she has any concerning symptoms in the interval. The patient voices understanding of current disease status and treatment options and is in agreement with the current care plan.  All questions were answered. The patient knows to call the clinic with any problems, questions or concerns. We can certainly see the patient much sooner if necessary.  Disclaimer: This note was dictated with voice recognition software. Similar sounding words can inadvertently be transcribed and may not be corrected upon review.

## 2017-10-17 NOTE — Telephone Encounter (Signed)
Printed avs and calender for December upcoming appointments. Per 10/30 los

## 2017-11-24 ENCOUNTER — Ambulatory Visit (HOSPITAL_COMMUNITY)
Admission: RE | Admit: 2017-11-24 | Discharge: 2017-11-24 | Disposition: A | Discharge status: Home / Self Care | Payer: BLUE CROSS/BLUE SHIELD | Source: Ambulatory Visit | Attending: Internal Medicine | Admitting: Internal Medicine

## 2017-11-24 ENCOUNTER — Encounter (HOSPITAL_COMMUNITY): Payer: Self-pay

## 2017-11-24 ENCOUNTER — Other Ambulatory Visit (HOSPITAL_BASED_OUTPATIENT_CLINIC_OR_DEPARTMENT_OTHER): Payer: BLUE CROSS/BLUE SHIELD

## 2017-11-24 DIAGNOSIS — C3491 Malignant neoplasm of unspecified part of right bronchus or lung: Secondary | ICD-10-CM | POA: Insufficient documentation

## 2017-11-24 DIAGNOSIS — J449 Chronic obstructive pulmonary disease, unspecified: Secondary | ICD-10-CM

## 2017-11-24 DIAGNOSIS — C3490 Malignant neoplasm of unspecified part of unspecified bronchus or lung: Secondary | ICD-10-CM

## 2017-11-24 DIAGNOSIS — C3411 Malignant neoplasm of upper lobe, right bronchus or lung: Secondary | ICD-10-CM | POA: Diagnosis not present

## 2017-11-24 LAB — CBC WITH DIFFERENTIAL/PLATELET
BASO%: 0.6 % (ref 0.0–2.0)
Basophils Absolute: 0 10*3/uL (ref 0.0–0.1)
EOS%: 2.5 % (ref 0.0–7.0)
Eosinophils Absolute: 0.1 10*3/uL (ref 0.0–0.5)
HCT: 37.4 % (ref 34.8–46.6)
HGB: 12.7 g/dL (ref 11.6–15.9)
LYMPH%: 19.1 % (ref 14.0–49.7)
MCH: 30.8 pg (ref 25.1–34.0)
MCHC: 34 g/dL (ref 31.5–36.0)
MCV: 90.6 fL (ref 79.5–101.0)
MONO#: 0.4 10*3/uL (ref 0.1–0.9)
MONO%: 8.3 % (ref 0.0–14.0)
NEUT#: 3.7 10*3/uL (ref 1.5–6.5)
NEUT%: 69.5 % (ref 38.4–76.8)
Platelets: 240 10*3/uL (ref 145–400)
RBC: 4.13 10*6/uL (ref 3.70–5.45)
RDW: 12.2 % (ref 11.2–14.5)
WBC: 5.3 10*3/uL (ref 3.9–10.3)
lymph#: 1 10*3/uL (ref 0.9–3.3)

## 2017-11-24 LAB — COMPREHENSIVE METABOLIC PANEL
ALT: 7 U/L (ref 0–55)
AST: 15 U/L (ref 5–34)
Albumin: 3.7 g/dL (ref 3.5–5.0)
Alkaline Phosphatase: 99 U/L (ref 40–150)
Anion Gap: 10 mEq/L (ref 3–11)
BUN: 22.1 mg/dL (ref 7.0–26.0)
CO2: 24 mEq/L (ref 22–29)
Calcium: 9.7 mg/dL (ref 8.4–10.4)
Chloride: 105 mEq/L (ref 98–109)
Creatinine: 1.2 mg/dL — ABNORMAL HIGH (ref 0.6–1.1)
EGFR: 47 mL/min/{1.73_m2} — ABNORMAL LOW (ref >60)
Glucose: 82 mg/dl (ref 70–140)
Potassium: 3.8 mEq/L (ref 3.5–5.1)
Sodium: 139 mEq/L (ref 136–145)
Total Bilirubin: 0.37 mg/dL (ref 0.20–1.20)
Total Protein: 6.7 g/dL (ref 6.4–8.3)

## 2017-11-24 MED ORDER — IOPAMIDOL (ISOVUE-300) INJECTION 61%
INTRAVENOUS | Status: AC
  Filled 2017-11-24: qty 75

## 2017-11-24 MED ORDER — IOPAMIDOL (ISOVUE-300) INJECTION 61%
75.0000 mL | Freq: Once | INTRAVENOUS | Status: AC | PRN
  Administered 2017-11-24: 75 mL via INTRAVENOUS

## 2017-11-28 ENCOUNTER — Ambulatory Visit (HOSPITAL_BASED_OUTPATIENT_CLINIC_OR_DEPARTMENT_OTHER): Payer: BLUE CROSS/BLUE SHIELD | Admitting: Internal Medicine

## 2017-11-28 ENCOUNTER — Encounter: Payer: Self-pay | Admitting: Internal Medicine

## 2017-11-28 VITALS — BP 110/62 | HR 100 | Temp 98.2°F | Resp 18 | Ht 66.0 in | Wt 191.8 lb

## 2017-11-28 DIAGNOSIS — R5383 Other fatigue: Secondary | ICD-10-CM | POA: Diagnosis not present

## 2017-11-28 DIAGNOSIS — R0602 Shortness of breath: Secondary | ICD-10-CM

## 2017-11-28 DIAGNOSIS — C3411 Malignant neoplasm of upper lobe, right bronchus or lung: Secondary | ICD-10-CM

## 2017-11-28 DIAGNOSIS — C3491 Malignant neoplasm of unspecified part of right bronchus or lung: Secondary | ICD-10-CM

## 2017-11-28 NOTE — Progress Notes (Signed)
[facility] Telephone:([phone]   Fax:(336) [place], [place] [address] [place] [address]  DIAGNOSIS: Limited stage (T2b, N2, M0) small cell lung cancer diagnosed in November 2017 and presented with large right hilar mass with mediastinal invasion and a right lower lobe pulmonary nodule.  PRIOR THERAPY:   1) Systemic chemotherapy with cisplatin 60 MG/M2 on day 1 and etoposide 120 MG/M2 on days 1, 2 and 3 concurrent with radiation status post 6 cycles.  2) status post prophylactic cranial irradiation.  CURRENT THERAPY:  None.  INTERVAL HISTORY: Tammy Bowers 66 y.o. female returns to the clinic today for follow-up visit accompanied by her husband.  The patient is feeling fine today with no specific complaints except for shortness of breath with exertion and intermittent dizzy spells.  She denied having any chest pain, cough or hemoptysis.  She denied having any recent weight loss or night sweats.  She has no nausea, vomiting, diarrhea or constipation.  She has no fever or chills.  The patient had repeat CT scan of the chest performed recently and she is here for evaluation and discussion of her scan results.   MEDICAL HISTORY: Past Medical History:  Diagnosis Date  . Adenopathy 10/10/2016   PERICARINAL  . Anemia   . Arthritis   . Asthma   . Cancer ([place])   . COPD (chronic obstructive pulmonary disease) ([place])   . Dyspnea   . Headache    migraines  . Hilar mass 10/10/2016   RIGHT  . History of kidney stones   . Insomnia   . Lung nodules 10/10/2016   RIGHT LOWER LOBE  . Mass of both adrenal glands ([place]) 10/10/2016  . Mood swing   . Pneumonia   . Substance abuse ([place])    TOBACCO  . UNSPECIFIED INFECTION OF BONE ANKLE AND FOOT 10/22/2009   Annotation: left ankle Qualifier: Diagnosis of  By: Patsy Baltimore RN, Denise      ALLERGIES:  is allergic to lidocaine.  MEDICATIONS:  Current Outpatient  Medications  Medication Sig Dispense Refill  . albuterol (PROVENTIL HFA;VENTOLIN HFA) 108 (90 BASE) MCG/ACT inhaler Inhale 2 puffs into the lungs every 4 (four) hours as needed for wheezing.    Marland Kitchen aspirin 325 MG tablet Take 325 mg by mouth daily.    . CHANTIX 1 MG tablet TK 1 T PO BID  1  . Cyclobenzaprine HCl (FLEXERIL PO) Take 5 mg by mouth 3 (three) times daily as needed.    Marland Kitchen dexamethasone (DECADRON) 4 MG tablet Take 4 mg by mouth twice a day for seven days then, 4 mg in the morning and 2 mg in the afternoon for seven days then, 2 mg twice per day for seven days then, 2 mg in the AM and 1 mg in the afternoon for seven days then, I mg in the morning and 1 mg in the afternoon for seven days then, STOP 120 tablet 1  . fluconazole (DIFLUCAN) 100 MG tablet Take 1 tablet (100 mg total) by mouth daily. Take 2 tablets on day 1 and then 1 tablet daily thereafter for a total of 7 days treatment. 8 tablet 1  . Fluticasone-Salmeterol (ADVAIR) 500-50 MCG/DOSE AEPB Inhale 1 puff into the lungs 2 (two) times daily.    Marland Kitchen HYDROcodone-acetaminophen (NORCO/VICODIN) 5-325 MG tablet Take 1 tablet by mouth 2 (two) times daily as needed.    Marland Kitchen levofloxacin (LEVAQUIN) 500 MG tablet  Take 1 tablet (500 mg total) by mouth daily. 7 tablet 0  . LORazepam (ATIVAN) 0.5 MG tablet Take 1 tablet (0.5 mg total) by mouth at bedtime. 20 tablet 0  . Misc Natural Products (GLUCOSAMINE CHOND COMPLEX/MSM PO) Take 1 tablet by mouth every other day.    . Multiple Vitamin (MULTIVITAMIN WITH MINERALS) TABS tablet Take 1 tablet by mouth every other day. Women's Multivitamin     . nicotine (NICODERM CQ) 14 mg/24hr patch Place 1 patch (14 mg total) onto the skin daily. 28 patch 0  . Omega-3 Fatty Acids (FISH OIL) 1000 MG CAPS Take 1,000 mg by mouth every other day.    Marland Kitchen OVER THE COUNTER MEDICATION Take 1 mL by mouth 2 (two) times daily. CBD Extract    . prochlorperazine (COMPAZINE) 10 MG tablet TAKE 1 TABLET BY MOUTH EVERY 6 HOURS AS NEEDED FOR  NAUSEA AND VOMITING 30 tablet 0   No current facility-administered medications for this visit.     SURGICAL HISTORY:  Past Surgical History:  Procedure Laterality Date  . ANKLE SURGERY    . APPENDECTOMY    . LUNG BIOPSY N/A 10/19/2016   Procedure: LUNG BIOPSY;  Surgeon: Grace Isaac, MD;  Location: [facility];  Service: Thoracic;  Laterality: N/A;  . TONSILLECTOMY    . VIDEO BRONCHOSCOPY WITH ENDOBRONCHIAL ULTRASOUND N/A 10/19/2016   Procedure: VIDEO BRONCHOSCOPY WITH ENDOBRONCHIAL ULTRASOUND;  Surgeon: Grace Isaac, MD;  [facility];  Service: Thoracic;  Laterality: N/A;    REVIEW OF SYSTEMS:  A comprehensive review of systems was negative except for: Respiratory: positive for dyspnea on exertion Neurological: positive for dizziness   PHYSICAL EXAMINATION: General appearance: alert, cooperative and no distress Head: Normocephalic, without obvious abnormality, atraumatic Neck: no adenopathy, no JVD, supple, symmetrical, trachea midline and thyroid not enlarged, symmetric, no tenderness/mass/nodules Lymph nodes: Cervical, supraclavicular, and axillary nodes normal. Resp: wheezes bilaterally Back: symmetric, no curvature. ROM normal. No CVA tenderness. Cardio: regular rate and rhythm, S1, S2 normal, no murmur, click, rub or gallop GI: soft, non-tender; bowel sounds normal; no masses,  no organomegaly Extremities: extremities normal, atraumatic, no cyanosis or edema  ECOG PERFORMANCE STATUS: 1 - Symptomatic but completely ambulatory  Blood pressure 110/62, pulse 100, temperature 98.2 F (36.8 C), temperature source Oral, resp. rate 18, height 5\' 6"  (1.676 m), weight 191 lb 12.8 oz (87 kg), SpO2 99 %.  LABORATORY DATA: Lab Results  Component Value Date   WBC 5.3 11/24/2017   HGB 12.7 11/24/2017   HCT 37.4 11/24/2017   MCV 90.6 11/24/2017   PLT 240 11/24/2017      Chemistry      Component Value Date/Time   NA 139 11/24/2017 0913   K 3.8 11/24/2017 0913   CL 106  11/21/2016 0427   CO2 24 11/24/2017 0913   BUN 22.1 11/24/2017 0913   CREATININE 1.2 (H) 11/24/2017 0913      Component Value Date/Time   CALCIUM 9.7 11/24/2017 0913   ALKPHOS 99 11/24/2017 0913   AST 15 11/24/2017 0913   ALT 7 11/24/2017 0913   BILITOT 0.37 11/24/2017 0913       RADIOGRAPHIC STUDIES: Ct Chest W Contrast  Result Date: 11/24/2017 CLINICAL DATA:  Right lung cancer EXAM: CT CHEST WITH CONTRAST TECHNIQUE: Multidetector CT imaging of the chest was performed during intravenous contrast administration. CONTRAST:  21mL ISOVUE-300 IOPAMIDOL (ISOVUE-300) INJECTION 61% COMPARISON:  10/11/2017 FINDINGS: Cardiovascular: The heart size is normal. No pericardial effusion. Atherosclerotic calcification is noted  in the wall of the thoracic aorta. Mediastinum/Nodes: No mediastinal lymphadenopathy. Calcified nodal tissue in the subcarinal station and right hilum is similar to prior. The esophagus has normal imaging features. There is no axillary lymphadenopathy. Lungs/Pleura: The posterior right upper lobe nodule of concern on the previous chest CT has decreased the ends become incorporated into a more platelike band of airspace opacity in the right parahilar lung, likely reflecting evolving lung reaction to radiation treatment. No specific discrete nodule is measurable on the current exam. A new 17 mm area of ground-glass attenuation in the anterior right hilum is also likely radiation related. Upper Abdomen: 2.6 cm right adrenal nodule is stable when measured in the same direction is on the prior study. Musculoskeletal: Bone windows reveal no worrisome lytic or sclerotic osseous lesions. IMPRESSION: 1. Posterior right upper lobe pulmonary nodule of concern on prior study has decreased it become incorporated into a more bandlike area of airspace opacity in the right parahilar lung, likely reflecting evolving post radiation change. The 2. Persistent amorphous soft tissue in the right hilum, similar  to prior. Electronically Signed   By: Misty Stanley M.D.   On: 11/24/2017 13:25    ASSESSMENT AND PLAN:  This is a very pleasant 66 years old white female with limited stage small cell lung cancer status post a course of systemic chemotherapy with cisplatin and etoposide for 6 cycles concurrent with radiation. She tolerated her treatment well except for fatigue. She is also status post prophylactic cranial irradiation. The patient has no complaints today except for the shortness of breath with exertion and intermittent dizzy spells. She had a repeat CT scan of the chest that showed no evidence for disease progression or recurrence.  There was decrease in the size of the posterior right upper lobe pulmonary nodule. I discussed the scan results with the patient and recommended for her to continue on observation with repeat CT scan of the chest in 3 months. She was advised to call immediately if she has any concerning symptoms in the interval.  The patient voices understanding of current disease status and treatment options and is in agreement with the current care plan.  All questions were answered. The patient knows to call the clinic with any problems, questions or concerns. We can certainly see the patient much sooner if necessary.  Disclaimer: This note was dictated with voice recognition software. Similar sounding words can inadvertently be transcribed and may not be corrected upon review.

## 2017-12-03 ENCOUNTER — Telehealth: Payer: Self-pay | Admitting: Internal Medicine

## 2017-12-03 NOTE — Telephone Encounter (Signed)
Scheduled appts per 12/11 los - sending confirmation letter in the mail.

## 2017-12-21 ENCOUNTER — Other Ambulatory Visit: Payer: Self-pay | Admitting: Family Medicine

## 2017-12-21 DIAGNOSIS — R42 Dizziness and giddiness: Secondary | ICD-10-CM

## 2017-12-21 DIAGNOSIS — C3401 Malignant neoplasm of right main bronchus: Secondary | ICD-10-CM

## 2017-12-26 ENCOUNTER — Other Ambulatory Visit: Payer: BLUE CROSS/BLUE SHIELD

## 2018-02-22 ENCOUNTER — Telehealth: Payer: Self-pay | Admitting: *Deleted

## 2018-02-22 NOTE — Telephone Encounter (Signed)
Oncology Nurse Navigator Documentation  Oncology Nurse Navigator Flowsheets 02/22/2018  Navigator Location [facility]  Navigator Encounter Type Telephone  Telephone Outgoing Call/I followed up on Tammy Bowers's schedule. She needs CT before Monday. I called and spoke with her. I gave her the number to central scheduling to call to make an appt for scan before she sees him.   Treatment Phase Follow-up  Barriers/Navigation Needs Coordination of Care;Education  Education Other  Interventions Coordination of Care;Education  Coordination of Care Other  Acuity Level 1  Time Spent with Patient 30

## 2018-02-23 ENCOUNTER — Ambulatory Visit (HOSPITAL_COMMUNITY)
Admission: RE | Admit: 2018-02-23 | Discharge: 2018-02-23 | Disposition: A | Discharge status: Home / Self Care | Payer: BLUE CROSS/BLUE SHIELD | Source: Ambulatory Visit | Attending: Internal Medicine | Admitting: Internal Medicine

## 2018-02-23 ENCOUNTER — Other Ambulatory Visit: Payer: BLUE CROSS/BLUE SHIELD

## 2018-02-23 ENCOUNTER — Inpatient Hospital Stay: Payer: BLUE CROSS/BLUE SHIELD | Attending: Internal Medicine

## 2018-02-23 DIAGNOSIS — I700 Atherosclerosis of aorta: Secondary | ICD-10-CM | POA: Diagnosis not present

## 2018-02-23 DIAGNOSIS — D3501 Benign neoplasm of right adrenal gland: Secondary | ICD-10-CM | POA: Diagnosis not present

## 2018-02-23 DIAGNOSIS — Z923 Personal history of irradiation: Secondary | ICD-10-CM | POA: Diagnosis not present

## 2018-02-23 DIAGNOSIS — C3491 Malignant neoplasm of unspecified part of right bronchus or lung: Secondary | ICD-10-CM | POA: Diagnosis not present

## 2018-02-23 DIAGNOSIS — J439 Emphysema, unspecified: Secondary | ICD-10-CM | POA: Insufficient documentation

## 2018-02-23 DIAGNOSIS — C3411 Malignant neoplasm of upper lobe, right bronchus or lung: Secondary | ICD-10-CM | POA: Diagnosis not present

## 2018-02-23 LAB — COMPREHENSIVE METABOLIC PANEL
ALT: 12 U/L (ref 0–55)
AST: 18 U/L (ref 5–34)
Albumin: 3.4 g/dL — ABNORMAL LOW (ref 3.5–5.0)
Alkaline Phosphatase: 116 U/L (ref 40–150)
Anion gap: 11 (ref 3–11)
BUN: 23 mg/dL (ref 7–26)
CO2: 24 mmol/L (ref 22–29)
Calcium: 9.5 mg/dL (ref 8.4–10.4)
Chloride: 105 mmol/L (ref 98–109)
Creatinine, Ser: 1.08 mg/dL (ref 0.60–1.10)
GFR calc Af Amer: >60 mL/min (ref >60)
GFR calc non Af Amer: 53 mL/min — ABNORMAL LOW (ref >60)
Glucose, Bld: 78 mg/dL (ref 70–140)
Potassium: 4.1 mmol/L (ref 3.5–5.1)
Sodium: 140 mmol/L (ref 136–145)
Total Bilirubin: 0.3 mg/dL (ref 0.2–1.2)
Total Protein: 6.9 g/dL (ref 6.4–8.3)

## 2018-02-23 LAB — CBC WITH DIFFERENTIAL/PLATELET
Basophils Absolute: 0.1 10*3/uL (ref 0.0–0.1)
Basophils Relative: 1 %
Eosinophils Absolute: 0.2 10*3/uL (ref 0.0–0.5)
Eosinophils Relative: 3 %
HCT: 37.4 % (ref 34.8–46.6)
Hemoglobin: 12.5 g/dL (ref 11.6–15.9)
Lymphocytes Relative: 15 %
Lymphs Abs: 0.8 10*3/uL — ABNORMAL LOW (ref 0.9–3.3)
MCH: 30.4 pg (ref 25.1–34.0)
MCHC: 33.4 g/dL (ref 31.5–36.0)
MCV: 91 fL (ref 79.5–101.0)
Monocytes Absolute: 0.4 10*3/uL (ref 0.1–0.9)
Monocytes Relative: 7 %
Neutro Abs: 3.9 10*3/uL (ref 1.5–6.5)
Neutrophils Relative %: 74 %
Platelets: 283 10*3/uL (ref 145–400)
RBC: 4.11 MIL/uL (ref 3.70–5.45)
RDW: 13.2 % (ref 11.2–14.5)
WBC: 5.3 10*3/uL (ref 3.9–10.3)

## 2018-02-23 MED ORDER — SODIUM CHLORIDE 0.9 % IJ SOLN
INTRAMUSCULAR | Status: AC
  Filled 2018-02-23: qty 50

## 2018-02-23 MED ORDER — IOPAMIDOL (ISOVUE-300) INJECTION 61%
INTRAVENOUS | Status: AC
  Administered 2018-02-23: 75 mL
  Filled 2018-02-23: qty 75

## 2018-02-23 MED ORDER — IOPAMIDOL (ISOVUE-300) INJECTION 61%
75.0000 mL | Freq: Once | INTRAVENOUS | Status: AC | PRN
  Administered 2018-02-23: 75 mL via INTRAVENOUS

## 2018-02-26 ENCOUNTER — Ambulatory Visit: Payer: BLUE CROSS/BLUE SHIELD | Admitting: Internal Medicine

## 2018-03-08 ENCOUNTER — Telehealth: Payer: Self-pay | Admitting: Internal Medicine

## 2018-03-08 NOTE — Telephone Encounter (Signed)
Tried to call regarding voicemail  °

## 2018-03-20 ENCOUNTER — Telehealth: Payer: Self-pay | Admitting: Internal Medicine

## 2018-03-20 ENCOUNTER — Ambulatory Visit: Payer: BLUE CROSS/BLUE SHIELD | Admitting: Oncology

## 2018-03-20 NOTE — Telephone Encounter (Signed)
Appointment rescheduled per pt request from phone message 4/2

## 2018-04-03 ENCOUNTER — Inpatient Hospital Stay: Payer: BLUE CROSS/BLUE SHIELD | Attending: Internal Medicine | Admitting: Internal Medicine

## 2018-04-03 ENCOUNTER — Encounter: Payer: Self-pay | Admitting: Internal Medicine

## 2018-04-03 ENCOUNTER — Telehealth: Payer: Self-pay | Admitting: Internal Medicine

## 2018-04-03 VITALS — BP 122/83 | HR 96 | Temp 97.8°F | Resp 17 | Ht 66.0 in | Wt 209.4 lb

## 2018-04-03 DIAGNOSIS — Z79899 Other long term (current) drug therapy: Secondary | ICD-10-CM | POA: Diagnosis not present

## 2018-04-03 DIAGNOSIS — R5383 Other fatigue: Secondary | ICD-10-CM

## 2018-04-03 DIAGNOSIS — M25512 Pain in left shoulder: Secondary | ICD-10-CM | POA: Diagnosis not present

## 2018-04-03 DIAGNOSIS — M25511 Pain in right shoulder: Secondary | ICD-10-CM | POA: Insufficient documentation

## 2018-04-03 DIAGNOSIS — M1990 Unspecified osteoarthritis, unspecified site: Secondary | ICD-10-CM | POA: Diagnosis not present

## 2018-04-03 DIAGNOSIS — Z923 Personal history of irradiation: Secondary | ICD-10-CM | POA: Diagnosis not present

## 2018-04-03 DIAGNOSIS — C3411 Malignant neoplasm of upper lobe, right bronchus or lung: Secondary | ICD-10-CM | POA: Insufficient documentation

## 2018-04-03 DIAGNOSIS — R05 Cough: Secondary | ICD-10-CM | POA: Diagnosis not present

## 2018-04-03 DIAGNOSIS — Z87442 Personal history of urinary calculi: Secondary | ICD-10-CM | POA: Diagnosis not present

## 2018-04-03 DIAGNOSIS — Z9221 Personal history of antineoplastic chemotherapy: Secondary | ICD-10-CM | POA: Diagnosis not present

## 2018-04-03 DIAGNOSIS — Z7982 Long term (current) use of aspirin: Secondary | ICD-10-CM | POA: Insufficient documentation

## 2018-04-03 DIAGNOSIS — J449 Chronic obstructive pulmonary disease, unspecified: Secondary | ICD-10-CM

## 2018-04-03 DIAGNOSIS — C3491 Malignant neoplasm of unspecified part of right bronchus or lung: Secondary | ICD-10-CM

## 2018-04-03 NOTE — Telephone Encounter (Signed)
Scheduled appt per 4/16 los - sent reminder letter in the mail with appt date and time . [facility] radiology to contact patient with ct scan.

## 2018-04-03 NOTE — Progress Notes (Signed)
[facility] [phone]   Fax:(336) [place], [place] [address] [address]  DIAGNOSIS: Limited stage (T2b, N2, M0) small cell lung cancer diagnosed in November 2017 and presented with large right hilar mass with mediastinal invasion and a right lower lobe pulmonary nodule.  PRIOR THERAPY:   1) Systemic chemotherapy with cisplatin 60 MG/M2 on day 1 and etoposide 120 MG/M2 on days 1, 2 and 3 concurrent with radiation status post 6 cycles.  2) status post prophylactic cranial irradiation.  CURRENT THERAPY: Observation.  INTERVAL HISTORY: Tammy Bowers 67 y.o. female returns to the clinic today for 3 months follow-up visit.  The patient is feeling fine today with no specific complaints except for arthralgia in her shoulders.  She also had mild cough.  She denied having any shortness of breath, chest pain or hemoptysis.  She has no nausea, vomiting, diarrhea or constipation.  She denied having any weight loss or night sweats.  She had repeat CT scan of the chest performed recently and she is here for evaluation and discussion of the scan results.   MEDICAL HISTORY: Past Medical History:  Diagnosis Date  . Adenopathy 10/10/2016   PERICARINAL  . Anemia   . Arthritis   . Asthma   . Cancer ([place])   . COPD (chronic obstructive pulmonary disease) ([place])   . Dyspnea   . Headache    migraines  . Hilar mass 10/10/2016   RIGHT  . History of kidney stones   . Insomnia   . Lung nodules 10/10/2016   RIGHT LOWER LOBE  . Mass of both adrenal glands ([facility]) 10/10/2016  . Mood swing   . Pneumonia   . Substance abuse ([place])    TOBACCO  . UNSPECIFIED INFECTION OF BONE ANKLE AND FOOT 10/22/2009   Annotation: left ankle Qualifier: Diagnosis of  By: Patsy [place] RN, Denise      ALLERGIES:  is allergic to lidocaine.  MEDICATIONS:  Current Outpatient Medications  Medication Sig Dispense Refill  . albuterol  (PROVENTIL HFA;VENTOLIN HFA) 108 (90 BASE) MCG/ACT inhaler Inhale 2 puffs into the lungs every 4 (four) hours as needed for wheezing.    Marland Kitchen aspirin 325 MG tablet Take 325 mg by mouth daily.    . Cyclobenzaprine HCl (FLEXERIL PO) Take 5 mg by mouth 3 (three) times daily as needed.    . Fluticasone-Salmeterol (ADVAIR) 500-50 MCG/DOSE AEPB Inhale 1 puff into the lungs 2 (two) times daily.    Marland Kitchen HYDROcodone-acetaminophen (NORCO/VICODIN) 5-325 MG tablet Take 1 tablet by mouth 2 (two) times daily as needed.    Marland Kitchen LORazepam (ATIVAN) 0.5 MG tablet Take 1 tablet (0.5 mg total) by mouth at bedtime. 20 tablet 0  . Misc Natural Products (GLUCOSAMINE CHOND COMPLEX/MSM PO) Take 1 tablet by mouth every other day.    . Multiple Vitamin (MULTIVITAMIN WITH MINERALS) TABS tablet Take 1 tablet by mouth every other day. Women's Multivitamin     . Omega-3 Fatty Acids (FISH OIL) 1000 MG CAPS Take 1,000 mg by mouth every other day.    Marland Kitchen OVER THE COUNTER MEDICATION Take 1 mL by mouth 2 (two) times daily. CBD Extract    . sertraline (ZOLOFT) 50 MG tablet Take 50 mg by mouth at bedtime.  5   No current facility-administered medications for this visit.     SURGICAL HISTORY:  Past Surgical History:  Procedure Laterality Date  . ANKLE SURGERY    .  APPENDECTOMY    . LUNG BIOPSY N/A 10/19/2016   Procedure: LUNG BIOPSY;  Surgeon: Grace Isaac, MD;  Location: [facility];  Service: Thoracic;  Laterality: N/A;  . TONSILLECTOMY    . VIDEO BRONCHOSCOPY WITH ENDOBRONCHIAL ULTRASOUND N/A 10/19/2016   Procedure: VIDEO BRONCHOSCOPY WITH ENDOBRONCHIAL ULTRASOUND;  Surgeon: Grace Isaac, MD;  Location: [facility];  Service: Thoracic;  Laterality: N/A;    REVIEW OF SYSTEMS:  A comprehensive review of systems was negative except for: Respiratory: positive for cough Musculoskeletal: positive for arthralgias   PHYSICAL EXAMINATION: General appearance: alert, cooperative and no distress Head: Normocephalic, without obvious abnormality,  atraumatic Neck: no adenopathy, no JVD, supple, symmetrical, trachea midline and thyroid not enlarged, symmetric, no tenderness/mass/nodules Lymph nodes: Cervical, supraclavicular, and axillary nodes normal. Resp: clear to auscultation bilaterally Back: symmetric, no curvature. ROM normal. No CVA tenderness. Cardio: regular rate and rhythm, S1, S2 normal, no murmur, click, rub or gallop GI: soft, non-tender; bowel sounds normal; no masses,  no organomegaly Extremities: extremities normal, atraumatic, no cyanosis or edema  ECOG PERFORMANCE STATUS: 1 - Symptomatic but completely ambulatory  Blood pressure 122/83, pulse 96, temperature 97.8 F (36.6 C), temperature source Oral, resp. rate 17, height 5\' 6"  (1.676 m), weight 209 lb 6.4 oz (95 kg), SpO2 100 %.  LABORATORY DATA: Lab Results  Component Value Date   WBC 5.3 02/23/2018   HGB 12.5 02/23/2018   HCT 37.4 02/23/2018   MCV 91.0 02/23/2018   PLT 283 02/23/2018      Chemistry      Component Value Date/Time   NA 140 02/23/2018 1553   NA 139 11/24/2017 0913   K 4.1 02/23/2018 1553   K 3.8 11/24/2017 0913   CL 105 02/23/2018 1553   CO2 24 02/23/2018 1553   CO2 24 11/24/2017 0913   BUN 23 02/23/2018 1553   BUN 22.1 11/24/2017 0913   CREATININE 1.08 02/23/2018 1553   CREATININE 1.2 (H) 11/24/2017 0913      Component Value Date/Time   CALCIUM 9.5 02/23/2018 1553   CALCIUM 9.7 11/24/2017 0913   ALKPHOS 116 02/23/2018 1553   ALKPHOS 99 11/24/2017 0913   AST 18 02/23/2018 1553   AST 15 11/24/2017 0913   ALT 12 02/23/2018 1553   ALT 7 11/24/2017 0913   BILITOT 0.3 02/23/2018 1553   BILITOT 0.37 11/24/2017 0913       RADIOGRAPHIC STUDIES: No results found.  ASSESSMENT AND PLAN:  This is a very pleasant 67 years old white female white female with limited stage small cell lung cancer status post a course of systemic chemotherapy with cisplatin and etoposide for 6 cycles concurrent with radiation. She tolerated her treatment well  except for fatigue. She is also status post prophylactic cranial irradiation. She is current on observation and she is feeling fine.  Repeat CT scan of the chest showed no concerning findings for disease progression but there was a small nodule in the right lung that need close observation on the upcoming scan. I discussed the scan results with the patient and recommended for her to continue in observation with repeat CT scan of the chest in 4 months. She was advised to call immediately if she has any concerning symptoms in the interval. The patient voices understanding of current disease status and treatment options and is in agreement with the current care plan.  All questions were answered. The patient knows to call the clinic with any problems, questions or concerns. We can certainly see the patient  much sooner if necessary.  Disclaimer: This note was dictated with voice recognition software. Similar sounding words can inadvertently be transcribed and may not be corrected upon review.

## 2018-04-03 NOTE — Patient Instructions (Signed)
Steps to Quit Smoking Smoking tobacco can be bad for your health. It can also affect almost every organ in your body. Smoking puts you and people around you at risk for many serious long-lasting (chronic) diseases. Quitting smoking is hard, but it is one of the best things that you can do for your health. It is never too late to quit. What are the benefits of quitting smoking? When you quit smoking, you lower your risk for getting serious diseases and conditions. They can include:  Lung cancer or lung disease.  Heart disease.  Stroke.  Heart attack.  Not being able to have children (infertility).  Weak bones (osteoporosis) and broken bones (fractures).  If you have coughing, wheezing, and shortness of breath, those symptoms may get better when you quit. You may also get sick less often. If you are pregnant, quitting smoking can help to lower your chances of having a baby of low birth weight. What can I do to help me quit smoking? Talk with your doctor about what can help you quit smoking. Some things you can do (strategies) include:  Quitting smoking totally, instead of slowly cutting back how much you smoke over a period of time.  Going to in-person counseling. You are more likely to quit if you go to many counseling sessions.  Using resources and support systems, such as: ? Online chats with a counselor. ? Phone quitlines. ? Printed self-help materials. ? Support groups or group counseling. ? Text messaging programs. ? Mobile phone apps or applications.  Taking medicines. Some of these medicines may have nicotine in them. If you are pregnant or breastfeeding, do not take any medicines to quit smoking unless your doctor says it is okay. Talk with your doctor about counseling or other things that can help you.  Talk with your doctor about using more than one strategy at the same time, such as taking medicines while you are also going to in-person counseling. This can help make  quitting easier. What things can I do to make it easier to quit? Quitting smoking might feel very hard at first, but there is a lot that you can do to make it easier. Take these steps:  Talk to your family and friends. Ask them to support and encourage you.  Call phone quitlines, reach out to support groups, or work with a counselor.  Ask people who smoke to not smoke around you.  Avoid places that make you want (trigger) to smoke, such as: ? Bars. ? Parties. ? Smoke-break areas at work.  Spend time with people who do not smoke.  Lower the stress in your life. Stress can make you want to smoke. Try these things to help your stress: ? Getting regular exercise. ? Deep-breathing exercises. ? Yoga. ? Meditating. ? Doing a body scan. To do this, close your eyes, focus on one area of your body at a time from head to toe, and notice which parts of your body are tense. Try to relax the muscles in those areas.  Download or buy apps on your mobile phone or tablet that can help you stick to your quit plan. There are many free apps, such as QuitGuide from the CDC (Centers for Disease Control and Prevention). You can find more support from smokefree.gov and other websites.  This information is not intended to replace advice given to you by your health care provider. Make sure you discuss any questions you have with your health care provider. Document Released: 10/01/2009 Document   Revised: 08/02/2016 Document Reviewed: 04/21/2015 Elsevier Interactive Patient Education  2018 Elsevier Inc.  

## 2018-07-17 ENCOUNTER — Telehealth: Payer: Self-pay | Admitting: Internal Medicine

## 2018-07-17 NOTE — Telephone Encounter (Signed)
Patient called to reschedule  °

## 2018-07-27 ENCOUNTER — Other Ambulatory Visit: Payer: BLUE CROSS/BLUE SHIELD

## 2018-07-30 ENCOUNTER — Ambulatory Visit: Payer: BLUE CROSS/BLUE SHIELD | Admitting: Internal Medicine

## 2018-10-25 ENCOUNTER — Ambulatory Visit (HOSPITAL_COMMUNITY): Payer: Medicare Other

## 2018-10-25 ENCOUNTER — Inpatient Hospital Stay: Payer: BLUE CROSS/BLUE SHIELD | Attending: Family Medicine

## 2018-10-29 ENCOUNTER — Inpatient Hospital Stay: Payer: BLUE CROSS/BLUE SHIELD | Admitting: Internal Medicine

## 2018-11-01 ENCOUNTER — Telehealth: Payer: Self-pay | Admitting: Medical Oncology

## 2018-11-01 NOTE — Telephone Encounter (Signed)
Husband [name]  called . Pt needs f/u appt . She just got back from [place] .She missed her august appt. Message to [place].

## 2018-11-05 ENCOUNTER — Telehealth: Payer: Self-pay | Admitting: Internal Medicine

## 2018-11-05 NOTE — Telephone Encounter (Signed)
Scheduled appt per 11/18 sch message  - left message with appt date and time.

## 2018-11-08 ENCOUNTER — Telehealth: Payer: Self-pay | Admitting: Medical Oncology

## 2018-11-08 ENCOUNTER — Inpatient Hospital Stay: Payer: BLUE CROSS/BLUE SHIELD

## 2018-11-08 ENCOUNTER — Ambulatory Visit (HOSPITAL_COMMUNITY): Admission: RE | Admit: 2018-11-08 | Payer: Medicare Other | Source: Ambulatory Visit

## 2018-11-08 NOTE — Telephone Encounter (Signed)
Pt cancelled lab , CT and f/u due to transportation. She will call back to reschedule her appts

## 2018-11-13 DIAGNOSIS — Z79899 Other long term (current) drug therapy: Secondary | ICD-10-CM | POA: Diagnosis not present

## 2018-11-13 DIAGNOSIS — Z23 Encounter for immunization: Secondary | ICD-10-CM | POA: Diagnosis not present

## 2018-11-13 DIAGNOSIS — J449 Chronic obstructive pulmonary disease, unspecified: Secondary | ICD-10-CM | POA: Diagnosis not present

## 2018-11-13 DIAGNOSIS — R635 Abnormal weight gain: Secondary | ICD-10-CM | POA: Diagnosis not present

## 2018-11-13 DIAGNOSIS — C3401 Malignant neoplasm of right main bronchus: Secondary | ICD-10-CM | POA: Diagnosis not present

## 2018-11-13 DIAGNOSIS — F321 Major depressive disorder, single episode, moderate: Secondary | ICD-10-CM | POA: Diagnosis not present

## 2018-11-20 ENCOUNTER — Ambulatory Visit: Payer: Medicare Other | Admitting: Internal Medicine

## 2018-11-23 DIAGNOSIS — R062 Wheezing: Secondary | ICD-10-CM | POA: Diagnosis not present

## 2018-11-23 DIAGNOSIS — J988 Other specified respiratory disorders: Secondary | ICD-10-CM | POA: Diagnosis not present

## 2018-11-23 DIAGNOSIS — R52 Pain, unspecified: Secondary | ICD-10-CM | POA: Diagnosis not present

## 2018-11-23 DIAGNOSIS — F17200 Nicotine dependence, unspecified, uncomplicated: Secondary | ICD-10-CM | POA: Diagnosis not present

## 2018-12-27 ENCOUNTER — Telehealth: Payer: Self-pay | Admitting: Medical Oncology

## 2018-12-27 NOTE — Telephone Encounter (Signed)
Calling to schedule scan and f/u. Messages sent to scheduler and managed care.

## 2018-12-28 ENCOUNTER — Telehealth: Payer: Self-pay | Admitting: Internal Medicine

## 2018-12-28 NOTE — Telephone Encounter (Signed)
R/s appt per 1/9 sch message - pt is aware of appt date and time

## 2019-01-15 ENCOUNTER — Ambulatory Visit (HOSPITAL_COMMUNITY)
Admission: RE | Admit: 2019-01-15 | Discharge: 2019-01-15 | Disposition: A | Discharge status: Home / Self Care | Payer: Medicare Other | Source: Ambulatory Visit | Attending: Internal Medicine | Admitting: Internal Medicine

## 2019-01-15 ENCOUNTER — Other Ambulatory Visit: Payer: Medicare Other

## 2019-01-15 ENCOUNTER — Inpatient Hospital Stay: Payer: Medicare Other | Attending: Family Medicine

## 2019-01-15 ENCOUNTER — Ambulatory Visit (HOSPITAL_COMMUNITY): Payer: Medicare Other

## 2019-01-15 DIAGNOSIS — Z79899 Other long term (current) drug therapy: Secondary | ICD-10-CM | POA: Diagnosis not present

## 2019-01-15 DIAGNOSIS — C3411 Malignant neoplasm of upper lobe, right bronchus or lung: Secondary | ICD-10-CM | POA: Insufficient documentation

## 2019-01-15 DIAGNOSIS — C3491 Malignant neoplasm of unspecified part of right bronchus or lung: Secondary | ICD-10-CM | POA: Insufficient documentation

## 2019-01-15 DIAGNOSIS — F17210 Nicotine dependence, cigarettes, uncomplicated: Secondary | ICD-10-CM | POA: Diagnosis not present

## 2019-01-15 DIAGNOSIS — R5383 Other fatigue: Secondary | ICD-10-CM | POA: Diagnosis not present

## 2019-01-15 DIAGNOSIS — Z9221 Personal history of antineoplastic chemotherapy: Secondary | ICD-10-CM | POA: Diagnosis not present

## 2019-01-15 DIAGNOSIS — M1990 Unspecified osteoarthritis, unspecified site: Secondary | ICD-10-CM | POA: Insufficient documentation

## 2019-01-15 DIAGNOSIS — Z923 Personal history of irradiation: Secondary | ICD-10-CM | POA: Insufficient documentation

## 2019-01-15 DIAGNOSIS — J449 Chronic obstructive pulmonary disease, unspecified: Secondary | ICD-10-CM | POA: Insufficient documentation

## 2019-01-15 DIAGNOSIS — Z7982 Long term (current) use of aspirin: Secondary | ICD-10-CM | POA: Insufficient documentation

## 2019-01-15 DIAGNOSIS — C3490 Malignant neoplasm of unspecified part of unspecified bronchus or lung: Secondary | ICD-10-CM | POA: Diagnosis not present

## 2019-01-15 LAB — CMP (CANCER CENTER ONLY)
ALT: 9 U/L (ref 0–44)
AST: 11 U/L — ABNORMAL LOW (ref 15–41)
Albumin: 3.4 g/dL — ABNORMAL LOW (ref 3.5–5.0)
Alkaline Phosphatase: 116 U/L (ref 38–126)
Anion gap: 10 (ref 5–15)
BUN: 17 mg/dL (ref 8–23)
CO2: 25 mmol/L (ref 22–32)
Calcium: 9.3 mg/dL (ref 8.9–10.3)
Chloride: 107 mmol/L (ref 98–111)
Creatinine: 1.08 mg/dL — ABNORMAL HIGH (ref 0.44–1.00)
GFR, Est AFR Am: >60 mL/min
GFR, Estimated: 54 mL/min — ABNORMAL LOW
Glucose, Bld: 67 mg/dL — ABNORMAL LOW (ref 70–99)
Potassium: 3.6 mmol/L (ref 3.5–5.1)
Sodium: 142 mmol/L (ref 135–145)
Total Bilirubin: 0.3 mg/dL (ref 0.3–1.2)
Total Protein: 6.9 g/dL (ref 6.5–8.1)

## 2019-01-15 LAB — CBC WITH DIFFERENTIAL (CANCER CENTER ONLY)
Abs Immature Granulocytes: 0.02 K/uL (ref 0.00–0.07)
Basophils Absolute: 0.1 K/uL (ref 0.0–0.1)
Basophils Relative: 1 %
Eosinophils Absolute: 0.2 K/uL (ref 0.0–0.5)
Eosinophils Relative: 3 %
HCT: 40.8 % (ref 36.0–46.0)
Hemoglobin: 13.7 g/dL (ref 12.0–15.0)
Immature Granulocytes: 0 %
Lymphocytes Relative: 15 %
Lymphs Abs: 1 K/uL (ref 0.7–4.0)
MCH: 30.3 pg (ref 26.0–34.0)
MCHC: 33.6 g/dL (ref 30.0–36.0)
MCV: 90.3 fL (ref 80.0–100.0)
Monocytes Absolute: 0.5 K/uL (ref 0.1–1.0)
Monocytes Relative: 8 %
Neutro Abs: 5 K/uL (ref 1.7–7.7)
Neutrophils Relative %: 73 %
Platelet Count: 307 K/uL (ref 150–400)
RBC: 4.52 MIL/uL (ref 3.87–5.11)
RDW: 12.3 % (ref 11.5–15.5)
WBC Count: 6.7 K/uL (ref 4.0–10.5)
nRBC: 0 % (ref 0.0–0.2)

## 2019-01-15 MED ORDER — SODIUM CHLORIDE (PF) 0.9 % IJ SOLN
INTRAMUSCULAR | Status: AC
  Filled 2019-01-15: qty 50

## 2019-01-15 MED ORDER — IOHEXOL 300 MG/ML  SOLN
75.0000 mL | Freq: Once | INTRAMUSCULAR | Status: AC | PRN
  Administered 2019-01-15: 75 mL via INTRAVENOUS

## 2019-01-17 ENCOUNTER — Telehealth: Payer: Self-pay | Admitting: Internal Medicine

## 2019-01-17 ENCOUNTER — Encounter: Payer: Self-pay | Admitting: Internal Medicine

## 2019-01-17 ENCOUNTER — Inpatient Hospital Stay (HOSPITAL_BASED_OUTPATIENT_CLINIC_OR_DEPARTMENT_OTHER): Payer: Medicare Other | Admitting: Internal Medicine

## 2019-01-17 VITALS — BP 123/84 | HR 105 | Temp 97.7°F | Resp 20 | Ht 66.0 in | Wt 224.4 lb

## 2019-01-17 DIAGNOSIS — C3411 Malignant neoplasm of upper lobe, right bronchus or lung: Secondary | ICD-10-CM

## 2019-01-17 DIAGNOSIS — R5383 Other fatigue: Secondary | ICD-10-CM

## 2019-01-17 DIAGNOSIS — Z9221 Personal history of antineoplastic chemotherapy: Secondary | ICD-10-CM | POA: Diagnosis not present

## 2019-01-17 DIAGNOSIS — Z7982 Long term (current) use of aspirin: Secondary | ICD-10-CM | POA: Diagnosis not present

## 2019-01-17 DIAGNOSIS — F17210 Nicotine dependence, cigarettes, uncomplicated: Secondary | ICD-10-CM | POA: Diagnosis not present

## 2019-01-17 DIAGNOSIS — M1990 Unspecified osteoarthritis, unspecified site: Secondary | ICD-10-CM | POA: Diagnosis not present

## 2019-01-17 DIAGNOSIS — J449 Chronic obstructive pulmonary disease, unspecified: Secondary | ICD-10-CM | POA: Diagnosis not present

## 2019-01-17 DIAGNOSIS — Z923 Personal history of irradiation: Secondary | ICD-10-CM | POA: Diagnosis not present

## 2019-01-17 DIAGNOSIS — Z79899 Other long term (current) drug therapy: Secondary | ICD-10-CM

## 2019-01-17 DIAGNOSIS — C3491 Malignant neoplasm of unspecified part of right bronchus or lung: Secondary | ICD-10-CM

## 2019-01-17 NOTE — Progress Notes (Signed)
[facility] Telephone:(336) [phone]   Fax:(336) [place], [place] [address] [address]  DIAGNOSIS: Limited stage (T2b, N2, M0) small cell lung cancer diagnosed in November 2017 and presented with large right hilar mass with mediastinal invasion and a right lower lobe pulmonary nodule.  PRIOR THERAPY:   1) Systemic chemotherapy with cisplatin 60 MG/M2 on day 1 and etoposide 120 MG/M2 on days 1, 2 and 3 concurrent with radiation status post 6 cycles.  Last dose was giving March 06, 2017. 2) status post prophylactic cranial irradiation.  CURRENT THERAPY: Observation.  INTERVAL HISTORY: Tammy Bowers 68 y.o. female returns to the clinic today for follow-up visit accompanied by her son Legrand [place].  The patient is feeling fine today with no concerning complaints except for fatigue and shortness of breath with exertion.  She denied having any cough or hemoptysis.  She denied having any chest pain.  She has no nausea, vomiting, diarrhea or constipation.  She denied having any headache or visual changes.  She quit smoking 3 weeks ago.  The patient had repeat CT scan of the chest performed recently and she is here for evaluation and discussion of her scan results.   MEDICAL HISTORY: Past Medical History:  Diagnosis Date  . Adenopathy 10/10/2016   PERICARINAL  . Anemia   . Arthritis   . Asthma   . Cancer ([facility])   . COPD (chronic obstructive pulmonary disease) ([facility])   . Dyspnea   . Headache    migraines  . Hilar mass 10/10/2016   RIGHT  . History of kidney stones   . Insomnia   . Lung nodules 10/10/2016   RIGHT LOWER LOBE  . Mass of both adrenal glands ([place]) 10/10/2016  . Mood swing   . Pneumonia   . Substance abuse ([place])    TOBACCO  . UNSPECIFIED INFECTION OF BONE ANKLE AND FOOT 10/22/2009   Annotation: left ankle Qualifier: Diagnosis of  By: Patsy [place] RN, Denise      ALLERGIES:  is allergic to  lidocaine.  MEDICATIONS:  Current Outpatient Medications  Medication Sig Dispense Refill  . albuterol (PROVENTIL HFA;VENTOLIN HFA) 108 (90 BASE) MCG/ACT inhaler Inhale 2 puffs into the lungs every 4 (four) hours as needed for wheezing.    Marland Kitchen aspirin 325 MG tablet Take 325 mg by mouth daily.    . Cyclobenzaprine HCl (FLEXERIL PO) Take 5 mg by mouth 3 (three) times daily as needed.    . Fluticasone-Salmeterol (ADVAIR) 500-50 MCG/DOSE AEPB Inhale 1 puff into the lungs 2 (two) times daily.    Marland Kitchen HYDROcodone-acetaminophen (NORCO/VICODIN) 5-325 MG tablet Take 1 tablet by mouth 2 (two) times daily as needed.    Marland Kitchen LORazepam (ATIVAN) 0.5 MG tablet Take 1 tablet (0.5 mg total) by mouth at bedtime. 20 tablet 0  . Misc Natural Products (GLUCOSAMINE CHOND COMPLEX/MSM PO) Take 1 tablet by mouth every other day.    . Multiple Vitamin (MULTIVITAMIN WITH MINERALS) TABS tablet Take 1 tablet by mouth every other day. Women's Multivitamin     . Omega-3 Fatty Acids (FISH OIL) 1000 MG CAPS Take 1,000 mg by mouth every other day.    Marland Kitchen OVER THE COUNTER MEDICATION Take 1 mL by mouth 2 (two) times daily. CBD Extract    . sertraline (ZOLOFT) 50 MG tablet Take 50 mg by mouth at bedtime.  5   No current facility-administered medications for this visit.  SURGICAL HISTORY:  Past Surgical History:  Procedure Laterality Date  . ANKLE SURGERY    . APPENDECTOMY    . LUNG BIOPSY N/A 10/19/2016   Procedure: LUNG BIOPSY;  Surgeon: Grace Isaac, MD;  Location: [facility];  Service: Thoracic;  Laterality: N/A;  . TONSILLECTOMY    . VIDEO BRONCHOSCOPY WITH ENDOBRONCHIAL ULTRASOUND N/A 10/19/2016   Procedure: VIDEO BRONCHOSCOPY WITH ENDOBRONCHIAL ULTRASOUND;  Surgeon: Grace Isaac, MD;  Location: [facility];  Service: Thoracic;  Laterality: N/A;    REVIEW OF SYSTEMS:  Constitutional: positive for fatigue Eyes: negative Ears, nose, mouth, throat, and face: negative Respiratory: positive for dyspnea on  exertion Cardiovascular: negative Gastrointestinal: negative Genitourinary:negative Integument/breast: negative Hematologic/lymphatic: negative Musculoskeletal:negative Neurological: negative Behavioral/Psych: negative Endocrine: negative Allergic/Immunologic: negative   PHYSICAL EXAMINATION: General appearance: alert, cooperative, fatigued and no distress Head: Normocephalic, without obvious abnormality, atraumatic Neck: no adenopathy, no JVD, supple, symmetrical, trachea midline and thyroid not enlarged, symmetric, no tenderness/mass/nodules Lymph nodes: Cervical, supraclavicular, and axillary nodes normal. Resp: clear to auscultation bilaterally Back: symmetric, no curvature. ROM normal. No CVA tenderness. Cardio: regular rate and rhythm, S1, S2 normal, no murmur, click, rub or gallop GI: soft, non-tender; bowel sounds normal; no masses,  no organomegaly Extremities: extremities normal, atraumatic, no cyanosis or edema Neurologic: Alert and oriented X 3, normal strength and tone. Normal symmetric reflexes. Normal coordination and gait  ECOG PERFORMANCE STATUS: 1 - Symptomatic but completely ambulatory  Blood pressure 123/84, pulse (!) 105, temperature 97.7 F (36.5 C), temperature source Oral, resp. rate 20, height 5\' 6"  (1.676 m), weight 224 lb 6.4 oz (101.8 kg), SpO2 97 %.  LABORATORY DATA: Lab Results  Component Value Date   WBC 6.7 01/15/2019   HGB 13.7 01/15/2019   HCT 40.8 01/15/2019   MCV 90.3 01/15/2019   PLT 307 01/15/2019      Chemistry      Component Value Date/Time   NA 142 01/15/2019 0942   NA 139 11/24/2017 0913   K 3.6 01/15/2019 0942   K 3.8 11/24/2017 0913   CL 107 01/15/2019 0942   CO2 25 01/15/2019 0942   CO2 24 11/24/2017 0913   BUN 17 01/15/2019 0942   BUN 22.1 11/24/2017 0913   CREATININE 1.08 (H) 01/15/2019 0942   CREATININE 1.2 (H) 11/24/2017 0913      Component Value Date/Time   CALCIUM 9.3 01/15/2019 0942   CALCIUM 9.7 11/24/2017  0913   ALKPHOS 116 01/15/2019 0942   ALKPHOS 99 11/24/2017 0913   AST 11 (L) 01/15/2019 0942   AST 15 11/24/2017 0913   ALT 9 01/15/2019 0942   ALT 7 11/24/2017 0913   BILITOT 0.3 01/15/2019 0942   BILITOT 0.37 11/24/2017 0913       RADIOGRAPHIC STUDIES: Ct Chest W Contrast  Result Date: 01/15/2019 CLINICAL DATA:  Follow-up small cell lung cancer EXAM: CT CHEST WITH CONTRAST TECHNIQUE: Multidetector CT imaging of the chest was performed during intravenous contrast administration. CONTRAST:  105mL OMNIPAQUE IOHEXOL 300 MG/ML  SOLN COMPARISON:  02/23/2018 FINDINGS: Cardiovascular: The heart is normal in size. No pericardial effusion. No evidence/aortic aneurysm. Atherosclerotic calcifications of the aortic arch. Mild coronary atherosclerosis of the LAD. Mediastinum/Nodes: 13 mm short axis subcarinal node (series 2/image 85), new. 14 mm short axis right azygoesophageal recess node (series 2/image 64), new. Visualized thyroid is unremarkable. Lungs/Pleura: Radiation changes in the right perihilar region. Wall thickening involving the right bronchus intermedius (series 2/image 59), new. 8 x 7 mm nodule in the medial  right lower lobe (series 7/image 83), previously 5 mm. Additional 12 x 8 mm nodule in the right lower lobe posterior to the bronchus intermedius (series 7/image 58), new. Mild paraseptal emphysematous changes, upper lobe predominant. No focal consolidation. No pleural effusion or pneumothorax. Upper Abdomen: 3.1 cm right adrenal nodule, grossly unchanged, likely reflecting a benign adrenal adenoma. Musculoskeletal: Visualized osseous structures are within normal limits. IMPRESSION: Two right lower lobe nodules measuring up to 12 mm, new/increased, suspicious for recurrent/metastatic disease. Associated wall thickening involving the right bronchus intermedius, worrisome for recurrence. Mild mediastinal lymphadenopathy, new, worrisome for nodal metastases. Aortic Atherosclerosis (ICD10-I70.0) and  Emphysema (ICD10-J43.9). Electronically Signed   By: Julian Hy M.D.   On: 01/15/2019 14:40    ASSESSMENT AND PLAN:  This is a very pleasant 68 years old white female with limited stage small cell lung cancer status post a course of systemic chemotherapy with cisplatin and etoposide for 6 cycles concurrent with radiation. She tolerated her treatment well except for fatigue. She is also status post prophylactic cranial irradiation. The patient has been in observation since March 2018. She had repeat CT scan of the chest performed recently.  I personally and independently reviewed the scan images and discussed the results with the patient and her son.  Unfortunately her scan showed development of 2 right lower lobe pulmonary nodules in addition to new mediastinal lymphadenopathy. This is suspicious for disease recurrence versus new primary. I recommended for the patient to have a PET scan for further evaluation of her disease and to choose the right side for rebiopsy. I will see her back for follow-up visit in 2-3 weeks for evaluation and discussion of her PET scan results and treatment options. For smoking cessation, I strongly encouraged the patient to continue quitting. The patient was advised to call immediately if she has any concerning symptoms in the interval. The patient voices understanding of current disease status and treatment options and is in agreement with the current care plan.  All questions were answered. The patient knows to call the clinic with any problems, questions or concerns. We can certainly see the patient much sooner if necessary.  Disclaimer: This note was dictated with voice recognition software. Similar sounding words can inadvertently be transcribed and may not be corrected upon review.

## 2019-01-17 NOTE — Telephone Encounter (Signed)
Scheduled appt per 01/30 los. ° °Printed calendar and avs. °

## 2019-01-28 ENCOUNTER — Ambulatory Visit (HOSPITAL_COMMUNITY)
Admission: RE | Admit: 2019-01-28 | Discharge: 2019-01-28 | Disposition: A | Discharge status: Home / Self Care | Payer: Medicare Other | Source: Ambulatory Visit | Attending: Internal Medicine | Admitting: Internal Medicine

## 2019-01-28 DIAGNOSIS — C3490 Malignant neoplasm of unspecified part of unspecified bronchus or lung: Secondary | ICD-10-CM | POA: Diagnosis not present

## 2019-01-28 DIAGNOSIS — C3491 Malignant neoplasm of unspecified part of right bronchus or lung: Secondary | ICD-10-CM

## 2019-01-28 LAB — GLUCOSE, CAPILLARY: Glucose-Capillary: 82 mg/dL (ref 70–99)

## 2019-01-28 MED ORDER — FLUDEOXYGLUCOSE F - 18 (FDG) INJECTION: 11.5000 | Freq: Once | INTRAVENOUS | Status: DC | PRN

## 2019-02-04 ENCOUNTER — Telehealth: Payer: Self-pay

## 2019-02-04 ENCOUNTER — Inpatient Hospital Stay: Payer: Medicare Other | Attending: Family Medicine | Admitting: Internal Medicine

## 2019-02-04 ENCOUNTER — Encounter: Payer: Self-pay | Admitting: *Deleted

## 2019-02-04 ENCOUNTER — Encounter: Payer: Self-pay | Admitting: Internal Medicine

## 2019-02-04 VITALS — BP 133/85 | HR 115 | Temp 98.7°F | Resp 18 | Ht 66.0 in | Wt 222.7 lb

## 2019-02-04 DIAGNOSIS — C3411 Malignant neoplasm of upper lobe, right bronchus or lung: Secondary | ICD-10-CM

## 2019-02-04 DIAGNOSIS — Z9221 Personal history of antineoplastic chemotherapy: Secondary | ICD-10-CM

## 2019-02-04 DIAGNOSIS — J449 Chronic obstructive pulmonary disease, unspecified: Secondary | ICD-10-CM

## 2019-02-04 DIAGNOSIS — R5383 Other fatigue: Secondary | ICD-10-CM

## 2019-02-04 DIAGNOSIS — R05 Cough: Secondary | ICD-10-CM | POA: Diagnosis not present

## 2019-02-04 DIAGNOSIS — C781 Secondary malignant neoplasm of mediastinum: Secondary | ICD-10-CM | POA: Diagnosis not present

## 2019-02-04 DIAGNOSIS — C3491 Malignant neoplasm of unspecified part of right bronchus or lung: Secondary | ICD-10-CM

## 2019-02-04 DIAGNOSIS — Z79899 Other long term (current) drug therapy: Secondary | ICD-10-CM | POA: Diagnosis not present

## 2019-02-04 DIAGNOSIS — Z791 Long term (current) use of non-steroidal anti-inflammatories (NSAID): Secondary | ICD-10-CM | POA: Diagnosis not present

## 2019-02-04 DIAGNOSIS — M1990 Unspecified osteoarthritis, unspecified site: Secondary | ICD-10-CM

## 2019-02-04 DIAGNOSIS — Z923 Personal history of irradiation: Secondary | ICD-10-CM

## 2019-02-04 NOTE — Progress Notes (Signed)
Tammy Telephone:([phone]   Fax:(336) [place], MD [address] [place] [address]  DIAGNOSIS: Limited stage (T2b, N2, M0) small cell lung cancer diagnosed in November 2017 and presented with large right hilar mass with mediastinal invasion and a right lower lobe pulmonary nodule.  PRIOR THERAPY:   1) Systemic chemotherapy with cisplatin 60 MG/M2 on day 1 and etoposide 120 MG/M2 on days 1, 2 and 3 concurrent with radiation status post 6 cycles.  Last dose was giving March 06, 2017. 2) status post prophylactic cranial irradiation.  CURRENT THERAPY: Observation.  INTERVAL HISTORY: Tammy Bowers 68 y.o. female returns to the clinic today for follow-up visit accompanied by her husband.  The patient is feeling fine today with no concerning complaints except for mild fatigue and cough.  She denied having any chest pain, shortness of breath or hemoptysis.  She denied having any recent weight loss or night sweats.  She has no nausea, vomiting, diarrhea or constipation.  She was found on previous CT scan of the chest to have suspicious disease recurrence.  The patient had a PET scan performed recently and she is here for evaluation and discussion of her PET scan results and further recommendation regarding her condition.   MEDICAL HISTORY: Past Medical History:  Diagnosis Date  . Adenopathy 10/10/2016   PERICARINAL  . Anemia   . Arthritis   . Asthma   . Cancer ([place])   . COPD (chronic obstructive pulmonary disease) ([place])   . Dyspnea   . Headache    migraines  . Hilar mass 10/10/2016   RIGHT  . History of kidney stones   . Insomnia   . Lung nodules 10/10/2016   RIGHT LOWER LOBE  . Mass of both adrenal glands ([place]) 10/10/2016  . Mood swing   . Pneumonia   . Substance abuse ([place])    TOBACCO  . UNSPECIFIED INFECTION OF BONE ANKLE AND FOOT 10/22/2009   Annotation: left ankle Qualifier: Diagnosis of   By: Patsy Baltimore RN, Denise      ALLERGIES:  is allergic to lidocaine.  MEDICATIONS:  Current Outpatient Medications  Medication Sig Dispense Refill  . albuterol (PROVENTIL HFA;VENTOLIN HFA) 108 (90 BASE) MCG/ACT inhaler Inhale 2 puffs into the lungs every 4 (four) hours as needed for wheezing.    . Cyclobenzaprine HCl (FLEXERIL PO) Take 5 mg by mouth 3 (three) times daily as needed.    . Fluticasone-Salmeterol (ADVAIR) 500-50 MCG/DOSE AEPB Inhale 1 puff into the lungs 2 (two) times daily.    Marland Kitchen HYDROcodone-acetaminophen (NORCO/VICODIN) 5-325 MG tablet Take 1 tablet by mouth 2 (two) times daily as needed.    Marland Kitchen LORazepam (ATIVAN) 0.5 MG tablet Take 1 tablet (0.5 mg total) by mouth at bedtime. 20 tablet 0  . Misc Natural Products (GLUCOSAMINE CHOND COMPLEX/MSM PO) Take 1 tablet by mouth every other day.    . Multiple Vitamin (MULTIVITAMIN WITH MINERALS) TABS tablet Take 1 tablet by mouth every other day. Women's Multivitamin     . Omega-3 Fatty Acids (FISH OIL) 1000 MG CAPS Take 1,000 mg by mouth every other day.    Marland Kitchen OVER THE COUNTER MEDICATION Take 1 mL by mouth 2 (two) times daily. CBD Extract    . sertraline (ZOLOFT) 50 MG tablet Take 50 mg by mouth at bedtime.  5  . ibuprofen (ADVIL,MOTRIN) 800 MG tablet TK 1 T PO BID WF OR MILK AND WF  PRF PAIN     No current facility-administered medications for this visit.     SURGICAL HISTORY:  Past Surgical History:  Procedure Laterality Date  . ANKLE SURGERY    . APPENDECTOMY    . LUNG BIOPSY N/A 10/19/2016   Procedure: LUNG BIOPSY;  Surgeon: Grace Isaac, MD;  Location: [facility];  Service: Thoracic;  Laterality: N/A;  . TONSILLECTOMY    . VIDEO BRONCHOSCOPY WITH ENDOBRONCHIAL ULTRASOUND N/A 10/19/2016   Procedure: VIDEO BRONCHOSCOPY WITH ENDOBRONCHIAL ULTRASOUND;  Surgeon: Grace Isaac, MD;  Location: [facility];  Service: Thoracic;  Laterality: N/A;    REVIEW OF SYSTEMS:  Constitutional: positive for fatigue Eyes: negative Ears, nose, mouth,  throat, and face: negative Respiratory: positive for cough Cardiovascular: negative Gastrointestinal: negative Genitourinary:negative Integument/breast: negative Hematologic/lymphatic: negative Musculoskeletal:negative Neurological: negative Behavioral/Psych: negative Endocrine: negative Allergic/Immunologic: negative   PHYSICAL EXAMINATION: General appearance: alert, cooperative, fatigued and no distress Head: Normocephalic, without obvious abnormality, atraumatic Neck: no adenopathy, no JVD, supple, symmetrical, trachea midline and thyroid not enlarged, symmetric, no tenderness/mass/nodules Lymph nodes: Cervical, supraclavicular, and axillary nodes normal. Resp: clear to auscultation bilaterally Back: symmetric, no curvature. ROM normal. No CVA tenderness. Cardio: regular rate and rhythm, S1, S2 normal, no murmur, click, rub or gallop GI: soft, non-tender; bowel sounds normal; no masses,  no organomegaly Extremities: extremities normal, atraumatic, no cyanosis or edema Neurologic: Alert and oriented X 3, normal strength and tone. Normal symmetric reflexes. Normal coordination and gait  ECOG PERFORMANCE STATUS: 1 - Symptomatic but completely ambulatory  Blood pressure 133/85, pulse (!) 115, temperature 98.7 F (37.1 C), temperature source Oral, resp. rate 18, height 5\' 6"  (1.676 m), weight 222 lb 11.2 oz (101 kg), SpO2 95 %.  LABORATORY DATA: Lab Results  Component Value Date   WBC 6.7 01/15/2019   HGB 13.7 01/15/2019   HCT 40.8 01/15/2019   MCV 90.3 01/15/2019   PLT 307 01/15/2019      Chemistry      Component Value Date/Time   NA 142 01/15/2019 0942   NA 139 11/24/2017 0913   K 3.6 01/15/2019 0942   K 3.8 11/24/2017 0913   CL 107 01/15/2019 0942   CO2 25 01/15/2019 0942   CO2 24 11/24/2017 0913   BUN 17 01/15/2019 0942   BUN 22.1 11/24/2017 0913   CREATININE 1.08 (H) 01/15/2019 0942   CREATININE 1.2 (H) 11/24/2017 0913      Component Value Date/Time   CALCIUM  9.3 01/15/2019 0942   CALCIUM 9.7 11/24/2017 0913   ALKPHOS 116 01/15/2019 0942   ALKPHOS 99 11/24/2017 0913   AST 11 (L) 01/15/2019 0942   AST 15 11/24/2017 0913   ALT 9 01/15/2019 0942   ALT 7 11/24/2017 0913   BILITOT 0.3 01/15/2019 0942   BILITOT 0.37 11/24/2017 0913       RADIOGRAPHIC STUDIES: Ct Chest W Contrast  Result Date: 01/15/2019 CLINICAL DATA:  Follow-up small cell lung cancer EXAM: CT CHEST WITH CONTRAST TECHNIQUE: Multidetector CT imaging of the chest was performed during intravenous contrast administration. CONTRAST:  65mL OMNIPAQUE IOHEXOL 300 MG/ML  SOLN COMPARISON:  02/23/2018 FINDINGS: Cardiovascular: The heart is normal in size. No pericardial effusion. No evidence/aortic aneurysm. Atherosclerotic calcifications of the aortic arch. Mild coronary atherosclerosis of the LAD. Mediastinum/Nodes: 13 mm short axis subcarinal node (series 2/image 85), new. 14 mm short axis right azygoesophageal recess node (series 2/image 64), new. Visualized thyroid is unremarkable. Lungs/Pleura: Radiation changes in the right perihilar region. Wall thickening involving the  right bronchus intermedius (series 2/image 59), new. 8 x 7 mm nodule in the medial right lower lobe (series 7/image 83), previously 5 mm. Additional 12 x 8 mm nodule in the right lower lobe posterior to the bronchus intermedius (series 7/image 58), new. Mild paraseptal emphysematous changes, upper lobe predominant. No focal consolidation. No pleural effusion or pneumothorax. Upper Abdomen: 3.1 cm right adrenal nodule, grossly unchanged, likely reflecting a benign adrenal adenoma. Musculoskeletal: Visualized osseous structures are within normal limits. IMPRESSION: Two right lower lobe nodules measuring up to 12 mm, new/increased, suspicious for recurrent/metastatic disease. Associated wall thickening involving the right bronchus intermedius, worrisome for recurrence. Mild mediastinal lymphadenopathy, new, worrisome for nodal  metastases. Aortic Atherosclerosis (ICD10-I70.0) and Emphysema (ICD10-J43.9). Electronically Signed   By: Tammy Bowers M.D.   On: 01/15/2019 14:40   Nm Pet Image Restag (ps) Skull Base To Thigh  Result Date: 01/28/2019 CLINICAL DATA:  Initial treatment strategy for small-cell lung cancer, right-sided. History of treated lung cancer in 2017. EXAM: NUCLEAR MEDICINE PET SKULL BASE TO THIGH TECHNIQUE: 11.5 mCi F-18 FDG was injected intravenously. Full-ring PET imaging was performed from the skull base to thigh after the radiotracer. CT data was obtained and used for attenuation correction and anatomic localization. Fasting blood glucose: 82 mg/dl COMPARISON:  Chest CT 01/15/2019.  Most recent PET 10/21/2016 FINDINGS: Mediastinal blood pool activity: SUV max 3.2 NECK: No areas of abnormal hypermetabolism. Incidental CT findings: No cervical adenopathy. CHEST: Hypermetabolism corresponding to the previously described right lower lobe pulmonary nodules. The more central nodule measures 11 mm and a S.U.V. max of 4.7 on image 31/8. The more peripheral measures 9 mm and a S.U.V. max of 4.5 on image 29/8. Hypermetabolism corresponding to confluent adenopathy/soft tissue thickening about the right-side of the mediastinum, including surrounding the bronchus intermedius. This measures on the order of a S.U.V. max of 16.4, including on image 72/4. A focus of hypermetabolism within the distal esophagus, in the region of a tiny hiatal hernia, is favored to be physiologic or related to esophagitis. Example at a S.U.V. max of 8.5 on image 96/4. Incidental CT findings: Lad coronary artery calcification. Radiation changes in the medial right lung. ABDOMEN/PELVIS: Low-level hypermetabolism corresponding to a 3.3 cm low-density right adrenal mass. This measures a S.U.V. max of 4.9 including on image 109/4. No suspicious abdominopelvic nodal or parenchymal hypermetabolism. Incidental CT findings: Abdominal aortic atherosclerosis.  Mild pelvic floor laxity. SKELETON: No abnormal marrow activity. Incidental CT findings: none IMPRESSION: 1. Findings consistent with recurrent/metastatic disease within the right-side of the mediastinum and right lower lobe. 2. No evidence of extrathoracic hypermetabolic metastasis. 3. Right adrenal adenoma. 4. Tiny hiatal hernia. Likely physiologic or inflammatory hypermetabolism about the distal esophagus and hiatal hernia. 5. Coronary artery atherosclerosis. Aortic Atherosclerosis (ICD10-I70.0). Electronically Signed   By: Abigail Miyamoto M.D.   On: 01/28/2019 16:44    ASSESSMENT AND PLAN:  This is a very pleasant 68 years old white female with limited stage small cell lung cancer status post a course of systemic chemotherapy with cisplatin and etoposide for 6 cycles concurrent with radiation. She tolerated her treatment well except for fatigue. She is also status post prophylactic cranial irradiation. The patient has been in observation since March 2018. She had repeat CT scan of the chest performed recently.  I personally and independently reviewed the scan images and discussed the results with the patient and her son.  Unfortunately her scan showed development of 2 right lower lobe pulmonary nodules in addition to new  mediastinal lymphadenopathy. I ordered a PET scan that was performed recently and showed findings consistent with recurrent/metastatic disease within the right side of the mediastinum and the right lower lobe.  There was no evidence for extrathoracic hypermetabolic disease. I personally and independently reviewed the scan images and discussed the result and showed the images to the patient and her husband. I recommended for the patient to see Dr. Servando Snare for consideration of repeat bronchoscopy and tissue biopsy for confirmation of the disease recurrence and to rule out any other type of lung cancer. I will also complete the staging work-up by ordering MRI of the brain to rule out brain  metastasis. I will see the patient back for follow-up visit in around 2 weeks for evaluation and more detailed discussion of her treatment options based on the tissue biopsy and staging work-up. She was advised to quit smoking and offered her a smoke cessation program. The patient was also advised to call immediately if she has any concerning symptoms in the interval. The patient voices understanding of current disease status and treatment options and is in agreement with the current care plan.  All questions were answered. The patient knows to call the clinic with any problems, questions or concerns. We can certainly see the patient much sooner if necessary.  Disclaimer: This note was dictated with voice recognition software. Similar sounding words can inadvertently be transcribed and may not be corrected upon review.

## 2019-02-04 NOTE — Telephone Encounter (Signed)
Printed avs and calender of upcoming appointment per 2/17 los

## 2019-02-04 NOTE — Progress Notes (Signed)
Oncology Nurse Navigator Documentation  Oncology Nurse Navigator Flowsheets 02/04/2019  Navigator Location [facility]  Navigator Encounter Type Other/per Dr. Julien Nordmann, I completed referral to [facility] for patient to see Dr. Servando Snare for re-biopsy.  I also contacted his office with an update regarding referral.   Treatment Phase Follow-up  Barriers/Navigation Needs Coordination of Care  Education Other  Interventions Coordination of Care  Coordination of Care Other  Acuity Level 2  Time Spent with Patient 15

## 2019-02-11 ENCOUNTER — Ambulatory Visit (HOSPITAL_COMMUNITY)
Admission: RE | Admit: 2019-02-11 | Discharge: 2019-02-11 | Disposition: A | Discharge status: Home / Self Care | Payer: Medicare Other | Source: Ambulatory Visit | Attending: Internal Medicine | Admitting: Internal Medicine

## 2019-02-11 DIAGNOSIS — C3491 Malignant neoplasm of unspecified part of right bronchus or lung: Secondary | ICD-10-CM | POA: Diagnosis not present

## 2019-02-11 DIAGNOSIS — C3490 Malignant neoplasm of unspecified part of unspecified bronchus or lung: Secondary | ICD-10-CM | POA: Diagnosis not present

## 2019-02-11 MED ORDER — GADOBUTROL 1 MMOL/ML IV SOLN
10.0000 mL | Freq: Once | INTRAVENOUS | Status: AC | PRN
  Administered 2019-02-11: 10 mL via INTRAVENOUS

## 2019-02-12 ENCOUNTER — Other Ambulatory Visit: Payer: Self-pay | Admitting: *Deleted

## 2019-02-12 ENCOUNTER — Institutional Professional Consult (permissible substitution) (INDEPENDENT_AMBULATORY_CARE_PROVIDER_SITE_OTHER): Payer: Medicare Other | Admitting: Cardiothoracic Surgery

## 2019-02-12 VITALS — BP 140/90 | HR 100 | Resp 20 | Ht 66.0 in | Wt 220.0 lb

## 2019-02-12 DIAGNOSIS — R918 Other nonspecific abnormal finding of lung field: Secondary | ICD-10-CM

## 2019-02-12 DIAGNOSIS — R591 Generalized enlarged lymph nodes: Secondary | ICD-10-CM | POA: Diagnosis not present

## 2019-02-12 NOTE — Progress Notes (Signed)
[address]       [place] [address]             [phone]                    Tammy Bowers [facility] Medical Record #[id number] Date of Birth: [birth year]  Referring: Curt Bears, MD Primary Care: Lujean Amel, MD Primary Cardiologist: No primary care provider on file.  Chief Complaint:    Chief Complaint  Patient presents with  . Lung Lesion    Surgical eval, MR Brain 02/11/19, PET Scan 01/28/19, Chest CT 01/15/19  . Adenopathy  10/19/2016  OPERATIVE REPORT PREOPERATIVE DIAGNOSIS:  Carcinoma of the right lung with involvement of the mediastinum. POSTOPERATIVE DIAGNOSIS:  Carcinoma of the right lung with involvement of the mediastinum. SURGICAL PROCEDURE:  Bronchoscopy with endobronchial ultrasound and biopsy of right lung mass and #7 lymph nodes. SURGEON:  Lanelle Bal, M.D.  Diagnosis Lung, biopsy, Intermedial Bronchus - SMALL CELL CARCINOMA. - SEE COMMENT. Microscopic Comment Dr. Vicente Males has reviewed the case and concurs with this interpretation. Dr. Servando Snare was paged on 10/20/16. Additional studies can be performed upon clinician request. (JBK:ds 10/20/16) Enid Cutter MD Adequacy Reason Satisfactory For Evaluation. Diagnosis FINE NEEDLE ASPIRATION, ENDOSCOPIC SPECIMEN A, EBUS 7 NODE (SPECIMEN 1 OF 2 COLLECTED ON 10/19/16): MALIGNANT CELLS PRESENT, CONSISTENT WITH SMALL CELL CARCINOMA. Enid Cutter MD  History of Present Illness:    Tammy Bowers 68 y.o. female is seen in the office  today for recurrence of right hilar mediastinal small cell carcinoma.  Patient was originally seen in November 2017 with a large right hilar mass, found to be small cell carcinoma of the lung.  She underwent chemotherapy and mediastinal radiation and also prophylactic cranial radiation.  Recently follow-up scan suggested increasing right hilar fullness, follow-up PET scan/restaging was done.   Patient has been referred by medical oncology to consider  repeat biopsy of a right hilar mass.   Patient notes that she has been very limited in her activity because of fatigue and shortness of breath.   Current Activity/ Functional Status:  Patient is independent with mobility/ambulation, transfers, ADL's, IADL's.   Zubrod Score: At the time of surgery this patient's most appropriate activity status/level should be described as: []     0    Normal activity, no symptoms []     1    Restricted in physical strenuous activity but ambulatory, able to do out light work [x]     2    Ambulatory and capable of self care, unable to do work activities, up and about               >50 % of waking hours                              []     3    Only limited self care, in bed greater than 50% of waking hours []     4    Completely disabled, no self care, confined to bed or chair []     5    Moribund   Past Medical History:  Diagnosis Date  . Adenopathy 10/10/2016   PERICARINAL  . Anemia   . Arthritis   . Asthma   . Cancer ([place])   . COPD (chronic obstructive pulmonary disease) ([place])   . Dyspnea   . Headache    migraines  . Hilar mass  10/10/2016   RIGHT  . History of kidney stones   . Insomnia   . Lung nodules 10/10/2016   RIGHT LOWER LOBE  . Mass of both adrenal glands ([place]) 10/10/2016  . Mood swing   . Pneumonia   . Substance abuse ([place])    TOBACCO  . UNSPECIFIED INFECTION OF BONE ANKLE AND FOOT 10/22/2009   Annotation: left ankle Qualifier: Diagnosis of  By: Gustavo Lah      Past Surgical History:  Procedure Laterality Date  . ANKLE SURGERY    . APPENDECTOMY    . LUNG BIOPSY N/A 10/19/2016   Procedure: LUNG BIOPSY;  Surgeon: Grace Isaac, MD;  Location: [facility];  Service: Thoracic;  Laterality: N/A;  . TONSILLECTOMY    . VIDEO BRONCHOSCOPY WITH ENDOBRONCHIAL ULTRASOUND N/A 10/19/2016   Procedure: VIDEO BRONCHOSCOPY WITH ENDOBRONCHIAL ULTRASOUND;  Surgeon: Grace Isaac, MD;  Location: [facility];  Service: Thoracic;  Laterality:  N/A;    Family History  Problem Relation Age of Onset  . Heart disease Father   . Colon cancer Father   . Heart disease Paternal Grandfather   . Ovarian cancer Mother      Social History   Tobacco Use  Smoking Status Light Tobacco Smoker  . Packs/day: 1.00  . Years: 50.00  . Pack years: 50.00  . Types: Cigarettes  . Last attempt to quit: 08/02/2016  . Years since quitting: 2.5  Smokeless Tobacco Never Used    Social History   Substance and Sexual Activity  Alcohol Use No     Allergies  Allergen Reactions  . Lidocaine Swelling    SWELLING REACTION UNSPECIFIED     Current Outpatient Medications  Medication Sig Dispense Refill  . albuterol (PROVENTIL HFA;VENTOLIN HFA) 108 (90 BASE) MCG/ACT inhaler Inhale 2 puffs into the lungs every 4 (four) hours as needed for wheezing.    . Cyclobenzaprine HCl (FLEXERIL PO) Take 5 mg by mouth 3 (three) times daily as needed.    . Fluticasone-Salmeterol (ADVAIR) 500-50 MCG/DOSE AEPB Inhale 1 puff into the lungs 2 (two) times daily.    Marland Kitchen HYDROcodone-acetaminophen (NORCO/VICODIN) 5-325 MG tablet Take 1 tablet by mouth 2 (two) times daily as needed.    Marland Kitchen ibuprofen (ADVIL,MOTRIN) 800 MG tablet TK 1 T PO BID WF OR MILK AND WF PRF PAIN    . LORazepam (ATIVAN) 0.5 MG tablet Take 1 tablet (0.5 mg total) by mouth at bedtime. 20 tablet 0  . Misc Natural Products (GLUCOSAMINE CHOND COMPLEX/MSM PO) Take 1 tablet by mouth every other day.    . Multiple Vitamin (MULTIVITAMIN WITH MINERALS) TABS tablet Take 1 tablet by mouth every other day. Women's Multivitamin     . Omega-3 Fatty Acids (FISH OIL) 1000 MG CAPS Take 1,000 mg by mouth every other day.    Marland Kitchen OVER THE COUNTER MEDICATION Take 1 mL by mouth 2 (two) times daily. CBD Extract    . sertraline (ZOLOFT) 50 MG tablet Take 50 mg by mouth at bedtime.  5   No current facility-administered medications for this visit.     Pertinent items are noted in HPI.   Review of Systems:     Cardiac Review  of Systems: [Y] = yes  or   [ N ] = no   Chest Pain [ N   ]  Resting SOB [ Y  ] Exertional SOB  [Y  ]  Orthopnea [ Y ]   Pedal Edema Aqua.Slicker  ]  Palpitations [ N ] Syncope  [ N ]   Presyncope [ N  ]   General Review of Systems: [Y] = yes [  ]=no Constitional: recent weight change [ Y ];  Wt loss over the last 3 months [   ] anorexia [  ]; fatigue [Y  ]; nausea [  ]; night sweats [  ]; fever [  ]; or chills [  ];           Eye : blurred vision [  ]; diplopia [   ]; vision changes [  ];  Amaurosis fugax[  ]; Resp: cough [  ];  wheezing[  ];  hemoptysis[  ]; shortness of breath[Y  ]; paroxysmal nocturnal dyspnea[ Y ]; dyspnea on exertion[ Y ]; or orthopnea[Y  ];  GI:  gallstones[  ], vomiting[  ];  dysphagia[  ]; melena[  ];  hematochezia [  ]; heartburn[  ];   Hx of  Colonoscopy[  ]; GU: kidney stones [  ]; hematuria[  ];   dysuria [  ];  nocturia[  ];  history of     obstruction [  ]; urinary frequency [  ]             Skin: rash, swelling[  ];, hair loss[  ];  peripheral edema[  ];  or itching[  ]; Musculosketetal: myalgias[  ];  joint swelling[  ];  joint erythema[  ];  joint pain[  ];  back pain[  ];  Heme/Lymph: bruising[  ];  bleeding[  ];  anemia[  ];  Neuro: TIA[  ];  headaches[  ];  stroke[  ];  vertigo[  ];  seizures[  ];   paresthesias[  ];  difficulty walking[  ];  Psych:depression[  ]; anxiety[  ];  Endocrine: diabetes[  ];  thyroid dysfunction[  ];  Immunizations: Flu up to date [  ]; Pneumococcal up to date [  ];  Other:     PHYSICAL EXAMINATION: BP 140/90   Pulse 100   Resp 20   Ht 5\' 6"  (1.676 m)   Wt 220 lb (99.8 kg)   SpO2 96% Comment: RA  BMI 35.51 kg/m  General appearance: alert, cooperative, appears older than stated age and no distress Head: Normocephalic, without obvious abnormality, atraumatic Neck: no adenopathy, no carotid bruit, no JVD, supple, symmetrical, trachea midline and thyroid not enlarged, symmetric, no tenderness/mass/nodules Lymph nodes: Cervical,  supraclavicular, and axillary nodes normal. Resp: clear to auscultation bilaterally Back: symmetric, no curvature. ROM normal. No CVA tenderness. Cardio: regular rate and rhythm, S1, S2 normal, no murmur, click, rub or gallop GI: soft, non-tender; bowel sounds normal; no masses,  no organomegaly Extremities: extremities normal, atraumatic, no cyanosis or edema and Homans sign is negative, no sign of DVT Neurologic: Grossly normal  Diagnostic Studies & Laboratory data:     Recent Radiology Findings:   Ct Chest W Contrast  Result Date: 01/15/2019 CLINICAL DATA:  Follow-up small cell lung cancer EXAM: CT CHEST WITH CONTRAST TECHNIQUE: Multidetector CT imaging of the chest was performed during intravenous contrast administration. CONTRAST:  61mL OMNIPAQUE IOHEXOL 300 MG/ML  SOLN COMPARISON:  02/23/2018 FINDINGS: Cardiovascular: The heart is normal in size. No pericardial effusion. No evidence/aortic aneurysm. Atherosclerotic calcifications of the aortic arch. Mild coronary atherosclerosis of the LAD. Mediastinum/Nodes: 13 mm short axis subcarinal node (series 2/image 85), new. 14 mm short axis right azygoesophageal recess node (series 2/image 64), new. Visualized thyroid is unremarkable. Lungs/Pleura: Radiation  changes in the right perihilar region. Wall thickening involving the right bronchus intermedius (series 2/image 59), new. 8 x 7 mm nodule in the medial right lower lobe (series 7/image 83), previously 5 mm. Additional 12 x 8 mm nodule in the right lower lobe posterior to the bronchus intermedius (series 7/image 58), new. Mild paraseptal emphysematous changes, upper lobe predominant. No focal consolidation. No pleural effusion or pneumothorax. Upper Abdomen: 3.1 cm right adrenal nodule, grossly unchanged, likely reflecting a benign adrenal adenoma. Musculoskeletal: Visualized osseous structures are within normal limits. IMPRESSION: Two right lower lobe nodules measuring up to 12 mm, new/increased,  suspicious for recurrent/metastatic disease. Associated wall thickening involving the right bronchus intermedius, worrisome for recurrence. Mild mediastinal lymphadenopathy, new, worrisome for nodal metastases. Aortic Atherosclerosis (ICD10-I70.0) and Emphysema (ICD10-J43.9). Electronically Signed   By: Julian Hy M.D.   On: 01/15/2019 14:40   Mr Jeri Cos VE Contrast  Result Date: 02/11/2019 CLINICAL DATA:  Small-cell lung cancer. History of prophylactic cranial irradiation. EXAM: MRI HEAD WITHOUT AND WITH CONTRAST TECHNIQUE: Multiplanar, multiecho pulse sequences of the brain and surrounding structures were obtained without and with intravenous contrast. CONTRAST:  10 mL Gadavist COMPARISON:  04/05/2017 FINDINGS: Brain: There is no evidence of acute infarct, intracranial hemorrhage, mass, midline shift, or extra-axial fluid collection. There is progressive, now confluent T2 hyperintensity in the cerebral white matter bilaterally. No abnormal enhancement is identified. There is mild cerebral atrophy. Vascular: Major intracranial vascular flow voids are preserved. Skull and upper cervical spine: Unremarkable bone marrow signal. Sinuses/Orbits: Unremarkable orbits. Paranasal sinuses and mastoid air cells are clear. Other: None. IMPRESSION: 1. No evidence of intracranial metastases. 2. Progressive cerebral white matter changes consistent with interval cranial irradiation. Electronically Signed   By: Logan Bores M.D.   On: 02/11/2019 10:10   Nm Pet Image Restag (ps) Skull Base To Thigh  Result Date: 01/28/2019 CLINICAL DATA:  Initial treatment strategy for small-cell lung cancer, right-sided. History of treated lung cancer in 2017. EXAM: NUCLEAR MEDICINE PET SKULL BASE TO THIGH TECHNIQUE: 11.5 mCi F-18 FDG was injected intravenously. Full-ring PET imaging was performed from the skull base to thigh after the radiotracer. CT data was obtained and used for attenuation correction and anatomic localization.  Fasting blood glucose: 82 mg/dl COMPARISON:  Chest CT 01/15/2019.  Most recent PET 10/21/2016 FINDINGS: Mediastinal blood pool activity: SUV max 3.2 NECK: No areas of abnormal hypermetabolism. Incidental CT findings: No cervical adenopathy. CHEST: Hypermetabolism corresponding to the previously described right lower lobe pulmonary nodules. The more central nodule measures 11 mm and a S.U.V. max of 4.7 on image 31/8. The more peripheral measures 9 mm and a S.U.V. max of 4.5 on image 29/8. Hypermetabolism corresponding to confluent adenopathy/soft tissue thickening about the right-side of the mediastinum, including surrounding the bronchus intermedius. This measures on the order of a S.U.V. max of 16.4, including on image 72/4. A focus of hypermetabolism within the distal esophagus, in the region of a tiny hiatal hernia, is favored to be physiologic or related to esophagitis. Example at a S.U.V. max of 8.5 on image 96/4. Incidental CT findings: Lad coronary artery calcification. Radiation changes in the medial right lung. ABDOMEN/PELVIS: Low-level hypermetabolism corresponding to a 3.3 cm low-density right adrenal mass. This measures a S.U.V. max of 4.9 including on image 109/4. No suspicious abdominopelvic nodal or parenchymal hypermetabolism. Incidental CT findings: Abdominal aortic atherosclerosis. Mild pelvic floor laxity. SKELETON: No abnormal marrow activity. Incidental CT findings: none IMPRESSION: 1. Findings consistent with recurrent/metastatic disease within the  right-side of the mediastinum and right lower lobe. 2. No evidence of extrathoracic hypermetabolic metastasis. 3. Right adrenal adenoma. 4. Tiny hiatal hernia. Likely physiologic or inflammatory hypermetabolism about the distal esophagus and hiatal hernia. 5. Coronary artery atherosclerosis. Aortic Atherosclerosis (ICD10-I70.0). Electronically Signed   By: Abigail Miyamoto M.D.   On: 01/28/2019 16:44     I have independently reviewed the above  radiology studies  and reviewed the findings with the patient.   Recent Lab Findings: Lab Results  Component Value Date   WBC 6.7 01/15/2019   HGB 13.7 01/15/2019   HCT 40.8 01/15/2019   PLT 307 01/15/2019   GLUCOSE 67 (L) 01/15/2019   ALT 9 01/15/2019   AST 11 (L) 01/15/2019   NA 142 01/15/2019   K 3.6 01/15/2019   CL 107 01/15/2019   CREATININE 1.08 (H) 01/15/2019   BUN 17 01/15/2019   CO2 25 01/15/2019   INR 0.97 11/19/2016      Assessment / Plan:   Likely recurrence of small cell cancer involving the right hilum.  CT scan and PET scan have been reviewed with the patient and I recommend that we proceed with repeat bronchoscopy ebus  and transbronchial biopsy to obtain confirmation of what appears to be recurrent small cell carcinoma of the lung. Patient is agreeable with this approach tentatively planned for Wednesday, March 4.  Grace Isaac MD      [address] [place] [address] Office [phone]   [place]eeper [phone]  02/12/2019 4:02 PM

## 2019-02-14 NOTE — Pre-Procedure Instructions (Signed)
Tammy Bowers  02/14/2019      [facility] DRUG STORE [place], [address] [place] [address] Phone: [phone] Fax: [place], [place] AT [facility] OF [address] [place] [address] Phone: [phone] Fax: [phone]    Your procedure is scheduled on Wednesday, March 4th   Report to [facility] at 11 AM             (posted surgery time 1:05p - 2:37p)   Call this number if you have problems the morning of surgery:  [phone]   Remember:   Do not eat any foods or drink any liquids after midnight, Tuesday.   7 days prior to surgery, STOP TAKING any Vitamins, Herbal Supplements, Anti-inflammatories, Blood Thinners.    Take these medicines the morning of surgery with A SIP OF WATER : Lorazepam, Flexeril.         Use your inhalers that morning.     Do not wear jewelry, make-up or nail polish.  Do not wear lotions, powders, perfumes, or deodorant.  Do not shave 48 hours prior to surgery.   Do not bring valuables to the hospital.  [facility] is not responsible for any belongings or valuables.  Contacts, dentures or bridgework may not be worn into surgery.  Leave your suitcase in the car.  After surgery it may be brought to your room.  For patients admitted to the hospital, discharge time will be determined by your treatment team.  Patients discharged the day of surgery will not be allowed to drive home, and will need someone to stay with you for the first 24 hrs.  Please read over the following fact sheets that you were given. Coughing and Deep Breathing and Surgical Site Infection Prevention      [facility]- Preparing For Surgery  Before surgery, you can play an important role. Because skin is not sterile, your skin needs to be as free of germs as possible. You can reduce the  number of germs on your skin by washing with CHG (chlorahexidine gluconate) Soap before surgery.  CHG is an antiseptic cleaner which kills germs and bonds with the skin to continue killing germs even after washing.    Oral Hygiene is also important to reduce your risk of infection.    Remember - BRUSH YOUR TEETH THE MORNING OF SURGERY WITH YOUR REGULAR TOOTHPASTE  Please do not use if you have an allergy to CHG or antibacterial soaps. If your skin becomes reddened/irritated stop using the CHG.  Do not shave (including legs and underarms) for at least 48 hours prior to first CHG shower. It is OK to shave your face.  Please follow these instructions carefully.   1. Shower the NIGHT BEFORE SURGERY and the MORNING OF SURGERY with CHG.   2. If you chose to wash your hair, wash your hair first as usual with your normal shampoo.  3. After you shampoo, rinse your hair and body thoroughly to remove the shampoo.  4. Use CHG as you would any other liquid soap. You can apply CHG directly to the skin and wash gently with a scrungie or a clean washcloth.   5. Apply the CHG Soap to your body ONLY FROM THE NECK DOWN.  Do not use on open wounds or open sores. Avoid  contact with your eyes, ears, mouth and genitals (private parts). Wash Face and genitals (private parts)  with your normal soap.  6. Wash thoroughly, paying special attention to the area where your surgery will be performed.  7. Thoroughly rinse your body with warm water from the neck down.  8. DO NOT shower/wash with your normal soap after using and rinsing off the CHG Soap.  9. Pat yourself dry with a CLEAN TOWEL.  10. Wear CLEAN PAJAMAS to bed the night before surgery, wear comfortable clothes the morning of surgery  11. Place CLEAN SHEETS on your bed the night of your first shower and DO NOT SLEEP WITH PETS.  Day of Surgery:  Do not apply any deodorants/lotions.  Please wear clean clothes to the hospital/surgery center.     Remember to brush your teeth WITH YOUR REGULAR TOOTHPASTE.

## 2019-02-15 ENCOUNTER — Ambulatory Visit (HOSPITAL_COMMUNITY)
Admission: RE | Admit: 2019-02-15 | Discharge: 2019-02-15 | Disposition: A | Discharge status: Home / Self Care | Payer: Medicare Other | Source: Ambulatory Visit | Attending: Cardiothoracic Surgery | Admitting: Cardiothoracic Surgery

## 2019-02-15 ENCOUNTER — Encounter (HOSPITAL_COMMUNITY): Payer: Self-pay

## 2019-02-15 ENCOUNTER — Other Ambulatory Visit: Payer: Self-pay

## 2019-02-15 DIAGNOSIS — Z01818 Encounter for other preprocedural examination: Secondary | ICD-10-CM | POA: Insufficient documentation

## 2019-02-15 DIAGNOSIS — J9811 Atelectasis: Secondary | ICD-10-CM | POA: Diagnosis not present

## 2019-02-15 DIAGNOSIS — R918 Other nonspecific abnormal finding of lung field: Secondary | ICD-10-CM | POA: Insufficient documentation

## 2019-02-15 HISTORY — DX: Other reaction to spinal and lumbar puncture: G97.1

## 2019-02-15 LAB — CBC
HCT: 44.4 % (ref 36.0–46.0)
Hemoglobin: 14.7 g/dL (ref 12.0–15.0)
MCH: 30 pg (ref 26.0–34.0)
MCHC: 33.1 g/dL (ref 30.0–36.0)
MCV: 90.6 fL (ref 80.0–100.0)
Platelets: 336 10*3/uL (ref 150–400)
RBC: 4.9 MIL/uL (ref 3.87–5.11)
RDW: 12.7 % (ref 11.5–15.5)
WBC: 8 10*3/uL (ref 4.0–10.5)
nRBC: 0 % (ref 0.0–0.2)

## 2019-02-15 LAB — PROTIME-INR
INR: 1.1 (ref 0.8–1.2)
Prothrombin Time: 13.7 seconds (ref 11.4–15.2)

## 2019-02-15 LAB — COMPREHENSIVE METABOLIC PANEL
ALT: 11 U/L (ref 0–44)
AST: 16 U/L (ref 15–41)
Albumin: 3.7 g/dL (ref 3.5–5.0)
Alkaline Phosphatase: 90 U/L (ref 38–126)
Anion gap: 10 (ref 5–15)
BUN: 14 mg/dL (ref 8–23)
CO2: 23 mmol/L (ref 22–32)
Calcium: 9.4 mg/dL (ref 8.9–10.3)
Chloride: 105 mmol/L (ref 98–111)
Creatinine, Ser: 1.07 mg/dL — ABNORMAL HIGH (ref 0.44–1.00)
GFR calc Af Amer: >60 mL/min (ref >60)
GFR calc non Af Amer: 54 mL/min — ABNORMAL LOW (ref >60)
Glucose, Bld: 95 mg/dL (ref 70–99)
Potassium: 3.6 mmol/L (ref 3.5–5.1)
Sodium: 138 mmol/L (ref 135–145)
Total Bilirubin: 0.7 mg/dL (ref 0.3–1.2)
Total Protein: 7.2 g/dL (ref 6.5–8.1)

## 2019-02-15 LAB — APTT: aPTT: 36 seconds (ref 24–36)

## 2019-02-15 NOTE — Progress Notes (Signed)
PCP - [facility] Cardiologist - denies Pulmonologist - denies  Chest x-ray - 02/15/19 EKG - not needed - per Jeneen Rinks PA Stress Test - denies ECHO - denies Cardiac Cath - denies   Anesthesia review: yes Jeneen Rinks saw patient in PAT Blood pressure should be checked manually   Patient denies shortness of breath, fever, cough and chest pain at PAT appointment   Patient verbalized understanding of instructions that were given to them at the PAT appointment. Patient was also instructed that they will need to review over the PAT instructions again at home before surgery.

## 2019-02-15 NOTE — Pre-Procedure Instructions (Signed)
Ketzia Guzek  02/15/2019      [facility] DRUG STORE [place], [address] [place] [address] Phone: [phone] Fax: [place] [id number] [facility], [place] AT [facility] OF [address] [place] [address] Phone: [phone] Fax: [phone]    Your procedure is scheduled on March 4  Report to [facility] at 1100 A.M.  Call this number if you have problems the morning of surgery:  [phone]   Remember:  Do not eat or drink after midnight.    Take these medicines the morning of surgery with A SIP OF WATER  albuterol (PROVENTIL HFA;VENTOLIN HFA) if needed Cyclobenzaprine HCl (FLEXERIL PO) if needed Fluticasone-Salmeterol (ADVAIR)   7 days prior to surgery STOP taking any Aspirin (unless otherwise instructed by your surgeon), Aleve, Naproxen, Ibuprofen, Motrin, Advil, Goody's, BC's, all herbal medications, fish oil, and all vitamins.   Do not wear jewelry, make-up or nail polish.  Do not wear lotions, powders, or perfumes, or deodorant.  Do not shave 48 hours prior to surgery.  Men may shave face and neck.  Do not bring valuables to the hospital.  [facility] is not responsible for any belongings or valuables.  Contacts, dentures or bridgework may not be worn into surgery.  Leave your suitcase in the car.  After surgery it may be brought to your room.  For patients admitted to the hospital, discharge time will be determined by your treatment team.  Patients discharged the day of surgery will not be allowed to drive home.    Special instructions:   [facility]- Preparing For Surgery  Before surgery, you can play an important role. Because skin is not sterile, your skin needs to be as free of germs as possible. You can reduce the number of germs on your skin by washing with CHG (chlorahexidine gluconate) Soap  before surgery.  CHG is an antiseptic cleaner which kills germs and bonds with the skin to continue killing germs even after washing.    Oral Hygiene is also important to reduce your risk of infection.  Remember - BRUSH YOUR TEETH THE MORNING OF SURGERY WITH YOUR REGULAR TOOTHPASTE  Please do not use if you have an allergy to CHG or antibacterial soaps. If your skin becomes reddened/irritated stop using the CHG.  Do not shave (including legs and underarms) for at least 48 hours prior to first CHG shower. It is OK to shave your face.  Please follow these instructions carefully.   1. Shower the NIGHT BEFORE SURGERY and the MORNING OF SURGERY with CHG.   2. If you chose to wash your hair, wash your hair first as usual with your normal shampoo.  3. After you shampoo, rinse your hair and body thoroughly to remove the shampoo.  4. Use CHG as you would any other liquid soap. You can apply CHG directly to the skin and wash gently with a scrungie or a clean washcloth.   5. Apply the CHG Soap to your body ONLY FROM THE NECK DOWN.  Do not use on open wounds or open sores. Avoid contact with your eyes, ears, mouth and genitals (private parts). Wash Face and genitals (private parts)  with your normal soap.  6. Wash thoroughly, paying special attention to the area where your surgery will be performed.  7. Thoroughly rinse  your body with warm water from the neck down.  8. DO NOT shower/wash with your normal soap after using and rinsing off the CHG Soap.  9. Pat yourself dry with a CLEAN TOWEL.  10. Wear CLEAN PAJAMAS to bed the night before surgery, wear comfortable clothes the morning of surgery  11. Place CLEAN SHEETS on your bed the night of your first shower and DO NOT SLEEP WITH PETS.    Day of Surgery:  Do not apply any deodorants/lotions.  Please wear clean clothes to the hospital/surgery center.   Remember to brush your teeth WITH YOUR REGULAR TOOTHPASTE.    Please read over the  following fact sheets that you were given.

## 2019-02-18 ENCOUNTER — Telehealth: Payer: Self-pay | Admitting: Internal Medicine

## 2019-02-18 ENCOUNTER — Telehealth: Payer: Self-pay | Admitting: Medical Oncology

## 2019-02-18 NOTE — Telephone Encounter (Signed)
Friday morning 02/22/19 around 10:30 AM

## 2019-02-18 NOTE — Telephone Encounter (Signed)
  5/9-[facility] conflict - biopsy and f/u same day. Appt with [facility] cancelled.

## 2019-02-18 NOTE — Anesthesia Preprocedure Evaluation (Addendum)
Anesthesia Evaluation  Patient identified by MRN, date of birth, ID band Patient awake    Reviewed: Allergy & Precautions, NPO status , Patient's Chart, lab work & pertinent test results  History of Anesthesia Complications (+) POST - OP SPINAL HEADACHE  Airway Mallampati: I  TM Distance: >3 FB Neck ROM: Full    Dental   Pulmonary COPD, Current Smoker,    Pulmonary exam normal        Cardiovascular Normal cardiovascular exam     Neuro/Psych    GI/Hepatic   Endo/Other    Renal/GU      Musculoskeletal   Abdominal   Peds  Hematology   Anesthesia Other Findings   Reproductive/Obstetrics                            Anesthesia Physical Anesthesia Plan  ASA: II  Anesthesia Plan: General   Post-op Pain Management:    Induction: Intravenous  PONV Risk Score and Plan: 2 and Ondansetron and Midazolam  Airway Management Planned:   Additional Equipment:   Intra-op Plan:   Post-operative Plan: Extubation in OR  Informed Consent: I have reviewed the patients History and Physical, chart, labs and discussed the procedure including the risks, benefits and alternatives for the proposed anesthesia with the patient or authorized representative who has indicated his/her understanding and acceptance.       Plan Discussed with: CRNA and Surgeon  Anesthesia Plan Comments: (BP on arrival to PAT appt by automatic cuff repeatedly ~160/109. Taken manually, pressure consistently ~100/80. Pressure should be checked manually if high by automatic cuff.)      Anesthesia Quick Evaluation

## 2019-02-18 NOTE — Telephone Encounter (Signed)
Scheduled appt per 3/2 sch message- pt aware.

## 2019-02-20 ENCOUNTER — Ambulatory Visit (HOSPITAL_COMMUNITY): Payer: Medicare Other | Admitting: Anesthesiology

## 2019-02-20 ENCOUNTER — Encounter (HOSPITAL_COMMUNITY): Payer: Self-pay

## 2019-02-20 ENCOUNTER — Inpatient Hospital Stay: Payer: Medicare Other | Admitting: Internal Medicine

## 2019-02-20 ENCOUNTER — Ambulatory Visit (HOSPITAL_COMMUNITY)
Admission: RE | Admit: 2019-02-20 | Discharge: 2019-02-20 | Disposition: A | Discharge status: Home / Self Care | Payer: Medicare Other | Attending: Cardiothoracic Surgery | Admitting: Cardiothoracic Surgery

## 2019-02-20 ENCOUNTER — Encounter (HOSPITAL_COMMUNITY)
Admission: RE | Disposition: A | Discharge status: Home / Self Care | Payer: Self-pay | Source: Home / Self Care | Attending: Cardiothoracic Surgery

## 2019-02-20 ENCOUNTER — Other Ambulatory Visit: Payer: Self-pay

## 2019-02-20 ENCOUNTER — Inpatient Hospital Stay: Payer: Medicare Other | Attending: Family Medicine

## 2019-02-20 ENCOUNTER — Ambulatory Visit (HOSPITAL_COMMUNITY): Payer: Medicare Other | Admitting: Physician Assistant

## 2019-02-20 DIAGNOSIS — Z5111 Encounter for antineoplastic chemotherapy: Secondary | ICD-10-CM | POA: Insufficient documentation

## 2019-02-20 DIAGNOSIS — D3501 Benign neoplasm of right adrenal gland: Secondary | ICD-10-CM | POA: Insufficient documentation

## 2019-02-20 DIAGNOSIS — J449 Chronic obstructive pulmonary disease, unspecified: Secondary | ICD-10-CM | POA: Insufficient documentation

## 2019-02-20 DIAGNOSIS — Z923 Personal history of irradiation: Secondary | ICD-10-CM | POA: Insufficient documentation

## 2019-02-20 DIAGNOSIS — K449 Diaphragmatic hernia without obstruction or gangrene: Secondary | ICD-10-CM | POA: Diagnosis not present

## 2019-02-20 DIAGNOSIS — M1990 Unspecified osteoarthritis, unspecified site: Secondary | ICD-10-CM | POA: Insufficient documentation

## 2019-02-20 DIAGNOSIS — Z87442 Personal history of urinary calculi: Secondary | ICD-10-CM | POA: Diagnosis not present

## 2019-02-20 DIAGNOSIS — C3491 Malignant neoplasm of unspecified part of right bronchus or lung: Secondary | ICD-10-CM | POA: Diagnosis not present

## 2019-02-20 DIAGNOSIS — G4700 Insomnia, unspecified: Secondary | ICD-10-CM | POA: Diagnosis not present

## 2019-02-20 DIAGNOSIS — C771 Secondary and unspecified malignant neoplasm of intrathoracic lymph nodes: Secondary | ICD-10-CM | POA: Diagnosis not present

## 2019-02-20 DIAGNOSIS — Z79899 Other long term (current) drug therapy: Secondary | ICD-10-CM | POA: Insufficient documentation

## 2019-02-20 DIAGNOSIS — R911 Solitary pulmonary nodule: Secondary | ICD-10-CM | POA: Diagnosis present

## 2019-02-20 DIAGNOSIS — R918 Other nonspecific abnormal finding of lung field: Secondary | ICD-10-CM

## 2019-02-20 DIAGNOSIS — Z5112 Encounter for antineoplastic immunotherapy: Secondary | ICD-10-CM | POA: Insufficient documentation

## 2019-02-20 DIAGNOSIS — C3411 Malignant neoplasm of upper lobe, right bronchus or lung: Secondary | ICD-10-CM | POA: Insufficient documentation

## 2019-02-20 DIAGNOSIS — R590 Localized enlarged lymph nodes: Secondary | ICD-10-CM | POA: Insufficient documentation

## 2019-02-20 DIAGNOSIS — Z85118 Personal history of other malignant neoplasm of bronchus and lung: Secondary | ICD-10-CM | POA: Diagnosis not present

## 2019-02-20 DIAGNOSIS — Z9221 Personal history of antineoplastic chemotherapy: Secondary | ICD-10-CM | POA: Insufficient documentation

## 2019-02-20 DIAGNOSIS — I700 Atherosclerosis of aorta: Secondary | ICD-10-CM | POA: Insufficient documentation

## 2019-02-20 DIAGNOSIS — D491 Neoplasm of unspecified behavior of respiratory system: Secondary | ICD-10-CM | POA: Diagnosis not present

## 2019-02-20 DIAGNOSIS — Z5189 Encounter for other specified aftercare: Secondary | ICD-10-CM | POA: Insufficient documentation

## 2019-02-20 DIAGNOSIS — F17210 Nicotine dependence, cigarettes, uncomplicated: Secondary | ICD-10-CM | POA: Insufficient documentation

## 2019-02-20 DIAGNOSIS — C801 Malignant (primary) neoplasm, unspecified: Secondary | ICD-10-CM | POA: Diagnosis not present

## 2019-02-20 HISTORY — PX: LUNG BIOPSY: SHX5088

## 2019-02-20 HISTORY — PX: VIDEO BRONCHOSCOPY WITH ENDOBRONCHIAL ULTRASOUND: SHX6177

## 2019-02-20 SURGERY — BRONCHOSCOPY, WITH EBUS
Anesthesia: General | Site: Chest

## 2019-02-20 MED ORDER — DEXAMETHASONE SODIUM PHOSPHATE 10 MG/ML IJ SOLN
INTRAMUSCULAR | Status: DC | PRN
  Administered 2019-02-20: 10 mg via INTRAVENOUS

## 2019-02-20 MED ORDER — EPINEPHRINE PF 1 MG/ML IJ SOLN
INTRAMUSCULAR | Status: AC
  Filled 2019-02-20: qty 1

## 2019-02-20 MED ORDER — PROPOFOL 10 MG/ML IV BOLUS
INTRAVENOUS | Status: AC
  Filled 2019-02-20: qty 20

## 2019-02-20 MED ORDER — MIDAZOLAM HCL 2 MG/2ML IJ SOLN
INTRAMUSCULAR | Status: AC
  Filled 2019-02-20: qty 2

## 2019-02-20 MED ORDER — ONDANSETRON HCL 4 MG/2ML IJ SOLN
INTRAMUSCULAR | Status: AC
  Filled 2019-02-20: qty 2

## 2019-02-20 MED ORDER — PROPOFOL 10 MG/ML IV BOLUS
INTRAVENOUS | Status: DC | PRN
  Administered 2019-02-20: 120 mg via INTRAVENOUS

## 2019-02-20 MED ORDER — MIDAZOLAM HCL 2 MG/2ML IJ SOLN
INTRAMUSCULAR | Status: DC | PRN
  Administered 2019-02-20: 2 mg via INTRAVENOUS

## 2019-02-20 MED ORDER — ONDANSETRON HCL 4 MG/2ML IJ SOLN
INTRAMUSCULAR | Status: DC | PRN
  Administered 2019-02-20: 4 mg via INTRAVENOUS

## 2019-02-20 MED ORDER — FENTANYL CITRATE (PF) 250 MCG/5ML IJ SOLN
INTRAMUSCULAR | Status: AC
  Filled 2019-02-20: qty 5

## 2019-02-20 MED ORDER — LIDOCAINE 2% (20 MG/ML) 5 ML SYRINGE
INTRAMUSCULAR | Status: DC | PRN
  Administered 2019-02-20: 20 mg via INTRAVENOUS
  Administered 2019-02-20: 80 mg via INTRAVENOUS

## 2019-02-20 MED ORDER — PHENYLEPHRINE 40 MCG/ML (10ML) SYRINGE FOR IV PUSH (FOR BLOOD PRESSURE SUPPORT)
PREFILLED_SYRINGE | INTRAVENOUS | Status: DC | PRN
  Administered 2019-02-20: 120 ug via INTRAVENOUS

## 2019-02-20 MED ORDER — ROCURONIUM BROMIDE 10 MG/ML (PF) SYRINGE
PREFILLED_SYRINGE | INTRAVENOUS | Status: DC | PRN
  Administered 2019-02-20: 50 mg via INTRAVENOUS

## 2019-02-20 MED ORDER — PHENYLEPHRINE 40 MCG/ML (10ML) SYRINGE FOR IV PUSH (FOR BLOOD PRESSURE SUPPORT)
PREFILLED_SYRINGE | INTRAVENOUS | Status: AC
  Filled 2019-02-20: qty 10

## 2019-02-20 MED ORDER — SODIUM CHLORIDE 0.9 % IR SOLN
Status: DC | PRN
  Administered 2019-02-20: 13:00:00

## 2019-02-20 MED ORDER — ROCURONIUM BROMIDE 50 MG/5ML IV SOSY
PREFILLED_SYRINGE | INTRAVENOUS | Status: AC
  Filled 2019-02-20: qty 20

## 2019-02-20 MED ORDER — FENTANYL CITRATE (PF) 100 MCG/2ML IJ SOLN
INTRAMUSCULAR | Status: DC | PRN
  Administered 2019-02-20 (×2): 50 ug via INTRAVENOUS
  Administered 2019-02-20: 100 ug via INTRAVENOUS
  Administered 2019-02-20: 50 ug via INTRAVENOUS

## 2019-02-20 MED ORDER — SUGAMMADEX SODIUM 200 MG/2ML IV SOLN
INTRAVENOUS | Status: DC | PRN
  Administered 2019-02-20: 200 mg via INTRAVENOUS

## 2019-02-20 MED ORDER — LACTATED RINGERS IV SOLN
INTRAVENOUS | Status: DC | PRN
  Administered 2019-02-20: 12:00:00 via INTRAVENOUS

## 2019-02-20 SURGICAL SUPPLY — 30 items
ADAPTER VALVE BIOPSY EBUS (MISCELLANEOUS) IMPLANT
ADPTR VALVE BIOPSY EBUS (MISCELLANEOUS)
BRUSH CYTOL CELLEBRITY 1.5X140 (MISCELLANEOUS) ×3 IMPLANT
CANISTER SUCT 3000ML PPV (MISCELLANEOUS) ×3 IMPLANT
CONT SPEC 4OZ CLIKSEAL STRL BL (MISCELLANEOUS) ×3 IMPLANT
COVER BACK TABLE 60X90IN (DRAPES) ×3 IMPLANT
COVER DOME SNAP 22 D (MISCELLANEOUS) ×3 IMPLANT
FORCEPS BIOP RJ4 1.8 (CUTTING FORCEPS) ×3 IMPLANT
GAUZE SPONGE 4X4 12PLY STRL (GAUZE/BANDAGES/DRESSINGS) ×3 IMPLANT
GLOVE BIO SURGEON STRL SZ 6.5 (GLOVE) ×3 IMPLANT
KIT CLEAN ENDO COMPLIANCE (KITS) ×6 IMPLANT
KIT TURNOVER KIT B (KITS) ×3 IMPLANT
MARKER SKIN DUAL TIP RULER LAB (MISCELLANEOUS) ×3 IMPLANT
NEEDLE ASPIRATION VIZISHOT 19G (NEEDLE) IMPLANT
NEEDLE ASPIRATION VIZISHOT 21G (NEEDLE) ×3 IMPLANT
NEEDLE FILTER BLUNT 18X 1/2SAF (NEEDLE)
NEEDLE FILTER BLUNT 18X1 1/2 (NEEDLE) IMPLANT
NS IRRIG 1000ML POUR BTL (IV SOLUTION) ×3 IMPLANT
OIL SILICONE PENTAX (PARTS (SERVICE/REPAIRS)) ×3 IMPLANT
PAD ARMBOARD 7.5X6 YLW CONV (MISCELLANEOUS) ×6 IMPLANT
SYR 20CC LL (SYRINGE) ×3 IMPLANT
SYR 20ML ECCENTRIC (SYRINGE) ×3 IMPLANT
TOWEL GREEN STERILE (TOWEL DISPOSABLE) ×3 IMPLANT
TOWEL GREEN STERILE FF (TOWEL DISPOSABLE) ×3 IMPLANT
TRAP SPECIMEN MUCOUS 40CC (MISCELLANEOUS) ×3 IMPLANT
TUBE CONNECTING 20X1/4 (TUBING) ×3 IMPLANT
VALVE BIOPSY  SINGLE USE (MISCELLANEOUS) ×1
VALVE BIOPSY SINGLE USE (MISCELLANEOUS) ×2 IMPLANT
VALVE SUCTION BRONCHIO DISP (MISCELLANEOUS) ×3 IMPLANT
WATER STERILE IRR 1000ML POUR (IV SOLUTION) ×3 IMPLANT

## 2019-02-20 NOTE — H&P (Signed)
[address]       [place] [address]             [phone]                    Ruthetta Magid [facility] #[id number] Date of Birth: 1951-03-29  Referring: Tammy Bears, MD Primary Care: Tammy Amel, MD Primary Cardiologist: No primary care provider on file.  Chief Complaint:    Chief Complaint  Patient presents with  . Lung Lesion    Surgical eval, MR Brain 02/11/19, PET Scan 01/28/19, Chest CT 01/15/19  . Adenopathy     10/19/2016  OPERATIVE REPORT PREOPERATIVE DIAGNOSIS:  Carcinoma of the right lung with involvement of the mediastinum. POSTOPERATIVE DIAGNOSIS:  Carcinoma of the right lung with involvement of the mediastinum. SURGICAL PROCEDURE:  Bronchoscopy with endobronchial ultrasound and biopsy of right lung mass and #7 lymph nodes. SURGEON:  Tammy Bowers, M.D.  Diagnosis Lung, biopsy, Intermedial Bronchus - SMALL CELL CARCINOMA. - SEE COMMENT. Microscopic Comment Dr. Vicente Bowers has reviewed the case and concurs with this interpretation. Dr. Servando Snare was paged on 10/20/16. Additional studies can be performed upon clinician request. (JBK:ds 10/20/16) Tammy Cutter MD Adequacy Reason Satisfactory For Evaluation. Diagnosis FINE NEEDLE ASPIRATION, ENDOSCOPIC SPECIMEN A, EBUS 7 NODE (SPECIMEN 1 OF 2 COLLECTED ON 10/19/16): MALIGNANT CELLS PRESENT, CONSISTENT WITH SMALL CELL CARCINOMA. Tammy Cutter MD  History of Present Illness:    Tammy Bowers 68 y.o. 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  Patient was originally seen in November 2017 with a large right hilar mass, found to be small cell carcinoma of the lung.  She underwent chemotherapy and mediastinal radiation and also prophylactic cranial radiation.  Recently follow-up scan suggested increasing right hilar fullness, follow-up PET scan/restaging was done.   Patient has been referred by medical oncology to consider  repeat biopsy of a right hilar mass.   Patient notes that she has been very limited in her activity because of fatigue and shortness of breath.   Current Activity/ Functional Status:  Patient is independent with mobility/ambulation, transfers, ADL's, IADL's.   Zubrod Score: At the time of surgery this patient's most appropriate activity status/level should be described as: []     0    Normal activity, no symptoms []     1    Restricted in physical strenuous activity but ambulatory, able to do out light work [x]     2    Ambulatory and capable of self care, unable to do work activities, up and about               >50 % of waking hours                              []     3    Only limited self care, in bed greater than 50% of waking hours []     4    Completely disabled, no self care, confined to bed or chair []     5    Moribund   Past Medical History:  Diagnosis Date  . Adenopathy 10/10/2016   PERICARINAL  . Anemia   . Arthritis   . Asthma   . Cancer ([place])    lung cancer Small Cell  . COPD (chronic obstructive pulmonary disease) ([facility])   . Dyspnea   . Headache  migraines  . Hilar mass 10/10/2016   RIGHT  . History of kidney stones   . Insomnia   . Lung nodules 10/10/2016   RIGHT LOWER LOBE  . Mass of both adrenal glands ([place]) 10/10/2016  . Mood swing   . Pneumonia   . Spinal headache   . Substance abuse ([place])    TOBACCO  . UNSPECIFIED INFECTION OF BONE ANKLE AND FOOT 10/22/2009   Annotation: left ankle Qualifier: Diagnosis of  By: Gustavo Lah      Past Surgical History:  Procedure Laterality Date  . ANKLE SURGERY     hardware removal, and initial ankle surgery  . APPENDECTOMY    . LUNG BIOPSY N/A 10/19/2016   Procedure: LUNG BIOPSY;  Surgeon: Tammy Isaac, MD;  Location: [facility];  Service: Thoracic;  Laterality: N/A;  . TONSILLECTOMY    . VIDEO BRONCHOSCOPY WITH ENDOBRONCHIAL ULTRASOUND N/A 10/19/2016   Procedure: VIDEO BRONCHOSCOPY WITH ENDOBRONCHIAL  ULTRASOUND;  Surgeon: Tammy Isaac, MD;  Location: [facility] OR;  Service: Thoracic;  Laterality: N/A;    Family History  Problem Relation Age of Onset  . Heart disease Father   . Colon cancer Father   . Heart disease Paternal Grandfather   . Ovarian cancer Mother      Social History   Tobacco Use  Smoking Status Current Every Day Smoker  . Packs/day: 0.50  . Years: 50.00  . Pack years: 25.00  . Types: Cigarettes  . Last attempt to quit: 08/02/2016  . Years since quitting: 2.5  Smokeless Tobacco Never Used    Social History   Substance and Sexual Activity  Alcohol Use Yes  . Alcohol/week: 7.0 standard drinks  . Types: 7 Shots of liquor per week     Allergies  Allergen Reactions  . Lidocaine Swelling    SWELLING REACTION UNSPECIFIED     No current facility-administered medications for this encounter.     Pertinent items are noted in HPI.   Review of Systems:     Cardiac Review of Systems: [Y] = yes  or   [ N ] = no   Chest Pain [ N   ]  Resting SOB [ Y  ] Exertional SOB  [Y  ]  Orthopnea [ Y ]   Pedal Edema Aqua.Slicker  ]    Palpitations [ N ] Syncope  [ N ]   Presyncope [ N  ]   General Review of Systems: [Y] = yes [  ]=no Constitional: recent weight change [ Y ];  Wt loss over the last 3 months [   ] anorexia [  ]; fatigue [Y  ]; nausea [  ]; night sweats [  ]; fever [  ]; or chills [  ];           Eye : blurred vision [  ]; diplopia [   ]; vision changes [  ];  Amaurosis fugax[  ]; Resp: cough [  ];  wheezing[  ];  hemoptysis[  ]; shortness of breath[Y  ]; paroxysmal nocturnal dyspnea[ Y ]; dyspnea on exertion[ Y ]; or orthopnea[Y  ];  GI:  gallstones[  ], vomiting[  ];  dysphagia[  ]; melena[  ];  hematochezia [  ]; heartburn[  ];   Hx of  Colonoscopy[  ]; GU: kidney stones [  ]; hematuria[  ];   dysuria [  ];  nocturia[  ];  history of     obstruction [  ];  urinary frequency [  ]             Skin: rash, swelling[  ];, hair loss[  ];  peripheral edema[  ];  or itching[   ]; Musculosketetal: myalgias[  ];  joint swelling[  ];  joint erythema[  ];  joint pain[  ];  back pain[  ];  Heme/Lymph: bruising[  ];  bleeding[  ];  anemia[  ];  Neuro: TIA[  ];  headaches[  ];  stroke[  ];  vertigo[  ];  seizures[  ];   paresthesias[  ];  difficulty walking[  ];  Psych:depression[  ]; anxiety[  ];  Endocrine: diabetes[  ];  thyroid dysfunction[  ];  Immunizations: Flu up to date [  ]; Pneumococcal up to date [  ];  Other:     PHYSICAL EXAMINATION: BP 140/90   Pulse (!) 105   Temp 97.7 F (36.5 C) (Oral)   Resp 18   Ht 5\' 6"  (1.676 m)   Wt 98.9 kg   SpO2 98%   BMI 35.20 kg/m  General appearance: alert, cooperative, appears older than stated age and no distress Head: Normocephalic, without obvious abnormality, atraumatic Neck: no adenopathy, no carotid bruit, no JVD, supple, symmetrical, trachea midline and thyroid not enlarged, symmetric, no tenderness/mass/nodules Lymph nodes: Cervical, supraclavicular, and axillary nodes normal. Resp: clear to auscultation bilaterally Back: symmetric, no curvature. ROM normal. No CVA tenderness. Cardio: regular rate and rhythm, S1, S2 normal, no murmur, click, rub or gallop GI: soft, non-tender; bowel sounds normal; no masses,  no organomegaly Extremities: extremities normal, atraumatic, no cyanosis or edema and Homans sign is negative, no sign of DVT Neurologic: Grossly normal  Diagnostic Studies & Laboratory data:     Recent Radiology Findings:   Ct Chest W Contrast  Result Date: 01/15/2019 CLINICAL DATA:  Follow-up small cell lung cancer EXAM: CT CHEST WITH CONTRAST TECHNIQUE: Multidetector CT imaging of the chest was performed during intravenous contrast administration. CONTRAST:  41mL OMNIPAQUE IOHEXOL 300 MG/ML  SOLN COMPARISON:  02/23/2018 FINDINGS: Cardiovascular: The heart is normal in size. No pericardial effusion. No evidence/aortic aneurysm. Atherosclerotic calcifications of the aortic arch. Mild coronary  atherosclerosis of the LAD. Mediastinum/Nodes: 13 mm short axis subcarinal node (series 2/image 85), new. 14 mm short axis right azygoesophageal recess node (series 2/image 64), new. Visualized thyroid is unremarkable. Lungs/Pleura: Radiation changes in the right perihilar region. Wall thickening involving the right bronchus intermedius (series 2/image 59), new. 8 x 7 mm nodule in the medial right lower lobe (series 7/image 83), previously 5 mm. Additional 12 x 8 mm nodule in the right lower lobe posterior to the bronchus intermedius (series 7/image 58), new. Mild paraseptal emphysematous changes, upper lobe predominant. No focal consolidation. No pleural effusion or pneumothorax. Upper Abdomen: 3.1 cm right adrenal nodule, grossly unchanged, likely reflecting a benign adrenal adenoma. Musculoskeletal: Visualized osseous structures are within normal limits. IMPRESSION: Two right lower lobe nodules measuring up to 12 mm, new/increased, suspicious for recurrent/metastatic disease. Associated wall thickening involving the right bronchus intermedius, worrisome for recurrence. Mild mediastinal lymphadenopathy, new, worrisome for nodal metastases. Aortic Atherosclerosis (ICD10-I70.0) and Emphysema (ICD10-J43.9). Electronically Signed   By: Tammy Bowers M.D.   On: 01/15/2019 14:40   Mr Tammy Bowers EN Contrast  Result Date: 02/11/2019 CLINICAL DATA:  Small-cell lung cancer. History of prophylactic cranial irradiation. EXAM: MRI HEAD WITHOUT AND WITH CONTRAST TECHNIQUE: Multiplanar, multiecho pulse sequences of the brain and surrounding structures were obtained without and with  intravenous contrast. CONTRAST:  10 mL Gadavist COMPARISON:  04/05/2017 FINDINGS: Brain: There is no evidence of acute infarct, intracranial hemorrhage, mass, midline shift, or extra-axial fluid collection. There is progressive, now confluent T2 hyperintensity in the cerebral white matter bilaterally. No abnormal enhancement is identified. There  is mild cerebral atrophy. Vascular: Major intracranial vascular flow voids are preserved. Skull and upper cervical spine: Unremarkable bone marrow signal. Sinuses/Orbits: Unremarkable orbits. Paranasal sinuses and mastoid air cells are clear. Other: None. IMPRESSION: 1. No evidence of intracranial metastases. 2. Progressive cerebral white matter changes consistent with interval cranial irradiation. Electronically Signed   By: Tammy Bowers M.D.   On: 02/11/2019 10:10   Nm Pet Image Restag (ps) Skull Base To Thigh  Result Date: 01/28/2019 CLINICAL DATA:  Initial treatment strategy for small-cell lung cancer, right-sided. History of treated lung cancer in 2017. EXAM: NUCLEAR MEDICINE PET SKULL BASE TO THIGH TECHNIQUE: 11.5 mCi F-18 FDG was injected intravenously. Full-ring PET imaging was performed from the skull base to thigh after the radiotracer. CT data was obtained and used for attenuation correction and anatomic localization. Fasting blood glucose: 82 mg/dl COMPARISON:  Chest CT 01/15/2019.  Most recent PET 10/21/2016 FINDINGS: Mediastinal blood pool activity: SUV max 3.2 NECK: No areas of abnormal hypermetabolism. Incidental CT findings: No cervical adenopathy. CHEST: Hypermetabolism corresponding to the previously described right lower lobe pulmonary nodules. The more central nodule measures 11 mm and a S.U.V. max of 4.7 on image 31/8. The more peripheral measures 9 mm and a S.U.V. max of 4.5 on image 29/8. Hypermetabolism corresponding to confluent adenopathy/soft tissue thickening about the right-side of the mediastinum, including surrounding the bronchus intermedius. This measures on the order of a S.U.V. max of 16.4, including on image 72/4. A focus of hypermetabolism within the distal esophagus, in the region of a tiny hiatal hernia, is favored to be physiologic or related to esophagitis. Example at a S.U.V. max of 8.5 on image 96/4. Incidental CT findings: Lad coronary artery calcification. Radiation  changes in the medial right lung. ABDOMEN/PELVIS: Low-level hypermetabolism corresponding to a 3.3 cm low-density right adrenal mass. This measures a S.U.V. max of 4.9 including on image 109/4. No suspicious abdominopelvic nodal or parenchymal hypermetabolism. Incidental CT findings: Abdominal aortic atherosclerosis. Mild pelvic floor laxity. SKELETON: No abnormal marrow activity. Incidental CT findings: none IMPRESSION: 1. Findings consistent with recurrent/metastatic disease within the right-side of the mediastinum and right lower lobe. 2. No evidence of extrathoracic hypermetabolic metastasis. 3. Right adrenal adenoma. 4. Tiny hiatal hernia. Likely physiologic or inflammatory hypermetabolism about the distal esophagus and hiatal hernia. 5. Coronary artery atherosclerosis. Aortic Atherosclerosis (ICD10-I70.0). Electronically Signed   By: Tammy Bowers M.D.   On: 01/28/2019 16:44     I have independently reviewed the above radiology studies  and reviewed the findings with the patient.   Recent Lab Findings: Lab Results  Component Value Date   WBC 8.0 02/15/2019   HGB 14.7 02/15/2019   HCT 44.4 02/15/2019   PLT 336 02/15/2019   GLUCOSE 95 02/15/2019   ALT 11 02/15/2019   AST 16 02/15/2019   NA 138 02/15/2019   K 3.6 02/15/2019   CL 105 02/15/2019   CREATININE 1.07 (H) 02/15/2019   BUN 14 02/15/2019   CO2 23 02/15/2019   INR 1.1 02/15/2019      Assessment / Plan:   Likely recurrence of small cell cancer involving the right hilum.  CT scan and PET scan have been reviewed with the patient and I  recommend that we proceed with repeat bronchoscopy ebus  and transbronchial biopsy to obtain confirmation of what appears to be recurrent small cell carcinoma of the lung.  The goals risks and alternatives of the planned surgical procedure Procedure(s): VIDEO BRONCHOSCOPY WITH ENDOBRONCHIAL ULTRASOUND (N/A) LUNG BIOPSY (N/A)  have been discussed with the patient in detail. The risks of the procedure  including death, infection, stroke, myocardial infarction, bleeding, blood transfusion have all been discussed specifically.  I have quoted Tammy Bowers a 1% of perioperative mortality and a complication rate as high as 20 %. The patient's questions have been answered.Tammy Bowers is willing  to proceed with the planned procedure.  Tammy Isaac MD      [address] [place] [address] Office [phone]   Beeper [phone]  02/20/2019 11:34 AM

## 2019-02-20 NOTE — Discharge Instructions (Signed)
Flexible Bronchoscopy, Care After This sheet gives you information about how to care for yourself after your procedure. Your health care provider may also give you more specific instructions. If you have problems or questions, contact your health care provider. What can I expect after the procedure? After the procedure, it is common to have the following symptoms for 24-48 hours:  A cough that is worse than it was before the procedure.  A low-grade fever.  A sore throat or hoarse voice.  Small streaks of blood in the mucus from your lungs (sputum), if tissue samples were removed (biopsy). Follow these instructions at home: Eating and drinking  Do not eat or drink anything (including water) for 2 hours after your procedure, or until your numbing medicine (local anesthetic) has worn off. Having a numb throat increases your risk of burning yourself or choking.  After your numbness is gone and your cough and gag reflexes have returned, you may start eating only soft foods and slowly drinking liquids.  The day after the procedure, return to your normal diet. Driving  Do not drive for 24 hours if you were given a medicine to help you relax (sedative).  Do not drive or use heavy machinery while taking prescription pain medicine. General instructions   Take over-the-counter and prescription medicines only as told by your health care provider.  Return to your normal activities as told by your health care provider. Ask your health care provider what activities are safe for you.  Do not use any products that contain nicotine or tobacco, such as cigarettes and e-cigarettes. If you need help quitting, ask your health care provider.  Keep all follow-up visits as told by your health care provider. This is important, especially if you had a biopsy taken. Get help right away if:  You have shortness of breath that gets worse.  You become light-headed or feel like you might faint.  You have  chest pain.  You cough up more than a small amount of blood.  The amount of blood you cough up increases. Summary  Common symptoms in the 24-48 hours following a flexible bronchoscopy include cough, low-grade fever, sore throat or hoarse voice, and blood-streaked mucus from the lungs (if you had a biopsy).  Do not eat or drink anything (including water) for 2 hours after your procedure, or until your local anesthetic has worn off. You can return to your normal diet the day after the procedure.  Get help right away if you develop worsening shortness of breath, have chest pain, become light-headed, or cough up more than a small amount of blood. This information is not intended to replace advice given to you by your health care provider. Make sure you discuss any questions you have with your health care provider. Document Released: 06/24/2005 Document Revised: 12/23/2016 Document Reviewed: 12/23/2016 Elsevier Interactive Patient Education  2019 [employer].

## 2019-02-20 NOTE — Anesthesia Procedure Notes (Signed)
Procedure Name: Intubation Date/Time: 02/20/2019 12:28 PM Performed by: Elayne Snare, CRNA Pre-anesthesia Checklist: Patient identified, Emergency Drugs available, Suction available and Patient being monitored Patient Re-evaluated:Patient Re-evaluated prior to induction Oxygen Delivery Method: Circle System Utilized Preoxygenation: Pre-oxygenation with 100% oxygen Induction Type: IV induction Ventilation: Mask ventilation without difficulty Laryngoscope Size: Mac and 3 Grade View: Grade I Tube type: Oral Tube size: 8.0 mm Number of attempts: 1 Airway Equipment and Method: Stylet Placement Confirmation: ETT inserted through vocal cords under direct vision,  positive ETCO2 and breath sounds checked- equal and bilateral Secured at: 21 cm Tube secured with: Tape Dental Injury: Teeth and Oropharynx as per pre-operative assessment

## 2019-02-20 NOTE — Brief Op Note (Addendum)
      [address]       , [address]             [phone]    02/20/2019  1:36 PM  PATIENT:  Tammy Bowers  68 y.o. female  PRE-OPERATIVE DIAGNOSIS:  Right lung mass   POST-OPERATIVE DIAGNOSIS:   PROCEDURE:  Procedure(s): VIDEO BRONCHOSCOPY WITH ENDOBRONCHIAL ULTRASOUND (N/A) LUNG BIOPSY (N/A) And transbronchial biopsy and biopsy of bronchus intermedius   SURGEON:  Surgeon(s) and Role:    * Grace Isaac, MD - Primary   ANESTHESIA:   general  EBL:  25 mL   BLOOD ADMINISTERED:none  DRAINS: none   LOCAL MEDICATIONS USED:  NONE  SPECIMEN:  Source of Specimen:  # & nodes and bronchus intermedius   DISPOSITION OF SPECIMEN:  PATHOLOGY  COUNTS:  YES  TOURNIQUET:  * No tourniquets in log *  DICTATION: .Dragon Dictation  PLAN OF CARE: Discharge to home after PACU  PATIENT DISPOSITION:  PACU - hemodynamically stable.   Delay start of Pharmacological VTE agent (>24hrs) due to surgical blood loss or risk of bleeding: yes

## 2019-02-20 NOTE — Transfer of Care (Signed)
Immediate Anesthesia Transfer of Care Note  Patient: Tammy Bowers  Procedure(s) Performed: VIDEO BRONCHOSCOPY WITH ENDOBRONCHIAL ULTRASOUND (N/A Chest) LUNG BIOPSY (N/A )  Patient Location: PACU  Anesthesia Type:General  Level of Consciousness: awake, alert , oriented and patient cooperative  Airway & Oxygen Therapy: Patient Spontanous Breathing and Patient connected to nasal cannula oxygen  Post-op Assessment: Report given to RN and Post -op Vital signs reviewed and stable  Post vital signs: Reviewed and stable  Last Vitals:  Vitals Value Taken Time  BP 152/72 02/20/2019  1:20 PM  Temp    Pulse 100 02/20/2019  1:22 PM  Resp 21 02/20/2019  1:22 PM  SpO2 93 % 02/20/2019  1:22 PM  Vitals shown include unvalidated device data.  Last Pain:  Vitals:   02/20/19 1123  TempSrc:   PainSc: 0-No pain      Patients Stated Pain Goal: 0 (17/53/01 0404)  Complications: No apparent anesthesia complications

## 2019-02-20 NOTE — Anesthesia Postprocedure Evaluation (Signed)
Anesthesia Post Note  Patient: Tammy Bowers  Procedure(s) Performed: VIDEO BRONCHOSCOPY WITH ENDOBRONCHIAL ULTRASOUND (N/A Chest) LUNG BIOPSY (N/A )     Patient location during evaluation: PACU Anesthesia Type: General Level of consciousness: awake and alert Pain management: pain level controlled Vital Signs Assessment: post-procedure vital signs reviewed and stable Respiratory status: spontaneous breathing, nonlabored ventilation, respiratory function stable and patient connected to nasal cannula oxygen Cardiovascular status: blood pressure returned to baseline and stable Postop Assessment: no apparent nausea or vomiting Anesthetic complications: no    Last Vitals:  Vitals:   02/20/19 1350 02/20/19 1404  BP: 121/81 107/71  Pulse: 91 93  Resp: 17 18  Temp:  36.7 C  SpO2: 93% 94%    Last Pain:  Vitals:   02/20/19 1404  TempSrc:   PainSc: 0-No pain                 Cieara Stierwalt DAVID

## 2019-02-21 ENCOUNTER — Encounter (HOSPITAL_COMMUNITY): Payer: Self-pay | Admitting: Cardiothoracic Surgery

## 2019-02-21 NOTE — Op Note (Signed)
NAMEMIREYAH, CHERVENAK MEDICAL RECORD HE:[id number] ACCOUNT [id number] DATE OF BIRTH:01/11/1951 FACILITY: [facility] LOCATION: [place], [place]  OPERATIVE REPORT  DATE OF PROCEDURE:  02/20/2019  PREOPERATIVE DIAGNOSIS:  History of small-cell cancer in the lung.  POSTOPERATIVE DIAGNOSIS:  History of small-cell cancer in the lung.  SURGICAL PROCEDURE:  Bronchoscopy with endoscopic ultrasound, transbronchial biopsy of lymph nodes, and biopsy of bronchus intermedius.  SURGEON:  Lanelle Bal, MD  BRIEF HISTORY:  The patient is a 68 year old female who 2 years previously presented with a large right hilar mass involving the bronchus intermedius and mediastinal nodes.  Biopsies at that time confirmed small-cell carcinoma of the lung.  The patient  underwent extensive treatment with radiation and chemotherapy with marked improvement and has been monitored since recent CT scan and PET scan showed progressive enlargement of a possible recurrent disease.  The patient was referred for rebiopsy.  Risks  and options were discussed, and the patient was agreeable and signed informed consent to proceed with bronchoscopy, EBUS, and biopsy.  DESCRIPTION OF PROCEDURE:  The patient underwent general endotracheal anesthesia without incident.  Appropriate timeout was performed, and we proceeded with fiberoptic bronchoscopy to the subsegmental level in the right and left tracheobronchial tree.   There were no endobronchial lesions noted on the left.  We then examined the right tracheobronchial tree, and at the bronchus intermedius, there was an area of narrowing, multiple lobulated areas that were friable, consistent with tumor.  The scope was  then removed.  The EBUS scope was then placed.  With the scope positioned into the left main stem bronchus, enlarged #7 nodes were easily identified.  Using a 21-gauge transbronchial needle, transbronchial biopsies of the #7 node were done with multiple   passes.  Initial smears of this tissue showed adequate diagnostic material.  To obtain further tissue, we then removed the EBUS scope, placed the standard scope back in, and with 2 mm biopsy forceps took multiple small fragments of the mass, mainly mass  in the bronchus intermedius.  The tracheobronchial tree was then suctioned clear of all secretions.  The scope was removed.  The patient was extubated in the operating room and transferred to the recovery room in good condition, having tolerated the  procedure without obvious complication.  LN/NUANCE  D:02/21/2019 T:02/21/2019 JOB:[id number]

## 2019-02-22 ENCOUNTER — Inpatient Hospital Stay (HOSPITAL_BASED_OUTPATIENT_CLINIC_OR_DEPARTMENT_OTHER): Payer: Medicare Other | Admitting: Internal Medicine

## 2019-02-22 ENCOUNTER — Encounter: Payer: Self-pay | Admitting: Internal Medicine

## 2019-02-22 VITALS — BP 110/60 | HR 95 | Temp 98.0°F | Resp 19 | Ht 66.0 in | Wt 221.2 lb

## 2019-02-22 DIAGNOSIS — Z79899 Other long term (current) drug therapy: Secondary | ICD-10-CM

## 2019-02-22 DIAGNOSIS — Z923 Personal history of irradiation: Secondary | ICD-10-CM | POA: Diagnosis not present

## 2019-02-22 DIAGNOSIS — Z5112 Encounter for antineoplastic immunotherapy: Secondary | ICD-10-CM | POA: Diagnosis not present

## 2019-02-22 DIAGNOSIS — R5382 Chronic fatigue, unspecified: Secondary | ICD-10-CM

## 2019-02-22 DIAGNOSIS — C3491 Malignant neoplasm of unspecified part of right bronchus or lung: Secondary | ICD-10-CM

## 2019-02-22 DIAGNOSIS — Z5111 Encounter for antineoplastic chemotherapy: Secondary | ICD-10-CM | POA: Diagnosis not present

## 2019-02-22 DIAGNOSIS — C3411 Malignant neoplasm of upper lobe, right bronchus or lung: Secondary | ICD-10-CM | POA: Diagnosis not present

## 2019-02-22 DIAGNOSIS — Z5189 Encounter for other specified aftercare: Secondary | ICD-10-CM | POA: Diagnosis not present

## 2019-02-22 DIAGNOSIS — Z7189 Other specified counseling: Secondary | ICD-10-CM

## 2019-02-22 NOTE — Patient Instructions (Signed)
Steps to Quit Smoking    Smoking tobacco can be bad for your health. It can also affect almost every organ in your body. Smoking puts you and people around you at risk for many serious long-lasting (chronic) diseases. Quitting smoking is hard, but it is one of the best things that you can do for your health. It is never too late to quit.  What are the benefits of quitting smoking?  When you quit smoking, you lower your risk for getting serious diseases and conditions. They can include:  · Lung cancer or lung disease.  · Heart disease.  · Stroke.  · Heart attack.  · Not being able to have children (infertility).  · Weak bones (osteoporosis) and broken bones (fractures).  If you have coughing, wheezing, and shortness of breath, those symptoms may get better when you quit. You may also get sick less often. If you are pregnant, quitting smoking can help to lower your chances of having a baby of low birth weight.  What can I do to help me quit smoking?  Talk with your doctor about what can help you quit smoking. Some things you can do (strategies) include:  · Quitting smoking totally, instead of slowly cutting back how much you smoke over a period of time.  · Going to in-person counseling. You are more likely to quit if you go to many counseling sessions.  · Using resources and support systems, such as:  ? Online chats with a counselor.  ? Phone quitlines.  ? Printed self-help materials.  ? Support groups or group counseling.  ? Text messaging programs.  ? Mobile phone apps or applications.  · Taking medicines. Some of these medicines may have nicotine in them. If you are pregnant or breastfeeding, do not take any medicines to quit smoking unless your doctor says it is okay. Talk with your doctor about counseling or other things that can help you.  Talk with your doctor about using more than one strategy at the same time, such as taking medicines while you are also going to in-person counseling. This can help make  quitting easier.  What things can I do to make it easier to quit?  Quitting smoking might feel very hard at first, but there is a lot that you can do to make it easier. Take these steps:  · Talk to your family and friends. Ask them to support and encourage you.  · Call phone quitlines, reach out to support groups, or work with a counselor.  · Ask people who smoke to not smoke around you.  · Avoid places that make you want (trigger) to smoke, such as:  ? Bars.  ? Parties.  ? Smoke-break areas at work.  · Spend time with people who do not smoke.  · Lower the stress in your life. Stress can make you want to smoke. Try these things to help your stress:  ? Getting regular exercise.  ? Deep-breathing exercises.  ? Yoga.  ? Meditating.  ? Doing a body scan. To do this, close your eyes, focus on one area of your body at a time from head to toe, and notice which parts of your body are tense. Try to relax the muscles in those areas.  · Download or buy apps on your mobile phone or tablet that can help you stick to your quit plan. There are many free apps, such as QuitGuide from the CDC (Centers for Disease Control and Prevention). You can find more   support from smokefree.gov and other websites.  This information is not intended to replace advice given to you by your health care provider. Make sure you discuss any questions you have with your health care provider.  Document Released: 10/01/2009 Document Revised: 08/02/2016 Document Reviewed: 04/21/2015  Elsevier Interactive Patient Education © 2019 [employer]vier Inc.

## 2019-02-22 NOTE — Progress Notes (Signed)
DISCONTINUE ON PATHWAY REGIMEN - Small Cell Lung     A cycle is every 21 days:     Etoposide        Dose Mod: None     Cisplatin        Dose Mod: None  **Always confirm dose/schedule in your pharmacy ordering system**  REASON: Other Reason PRIOR TREATMENT: LOS15: Etoposide Days 1, 2, 3 + Cisplatin Day 1 q21 Days x 4 Cycles with Concurrent Radiation** TREATMENT RESPONSE: Progressive Disease (PD)  START ON PATHWAY REGIMEN - Small Cell Lung     A cycle is every 21 days:     Etoposide      Carboplatin   **Always confirm dose/schedule in your pharmacy ordering system**  Patient Characteristics: Relapsed or Progressive Disease, Second Line, Relapse > 6 Months Therapeutic Status: Relapsed or Progressive Disease Line of Therapy: Second Line Time to Relapse: Relapse > 6 Months Intent of Therapy: Non-Curative / Palliative Intent, Discussed with Patient

## 2019-02-22 NOTE — Progress Notes (Signed)
[facility] [phone]   Fax:(336) [place], [place] [address] [place] [address]  DIAGNOSIS: Recurrent small cell lung cancer initially diagnosed as Limited stage (T2b, N2, M0) small cell lung cancer diagnosed in November 2017 and presented with large right hilar mass with mediastinal invasion and a right lower lobe pulmonary nodule.  The patient had disease recurrence in the right lower lobe and mediastinal lymph nodes in February 2020.  PRIOR THERAPY:   1) Systemic chemotherapy with cisplatin 60 MG/M2 on day 1 and etoposide 120 MG/M2 on days 1, 2 and 3 concurrent with radiation status post 6 cycles.  Last dose was giving March 06, 2017. 2) status post prophylactic cranial irradiation.  CURRENT THERAPY: Systemic chemotherapy with carboplatin for AUC of 5 on day 1, etoposide.  100 mg/M2 on days 1, 2 and 3 as well as Tecentriq 1200 mg IV with Neulasta support every 3 weeks.  First dose March 05, 2019.  INTERVAL HISTORY: Tammy Bowers 67 y.o. female returns to the clinic today for follow-up visit accompanied by her husband.  The patient is feeling fine today with no concerning complaints except for cough.  She denied having any chest pain, shortness of breath or hemoptysis.  She denied having any fever or chills.  She has no nausea, vomiting, diarrhea or constipation.  She has no headache or visual changes.  She denied having any recent weight loss or night sweats.  She was seen by Dr. Servando Snare and she underwent repeat bronchoscopy with endobronchial ultrasound and the final pathology was consistent with recurrent small cell lung cancer.  The patient is here today for evaluation and recommendation regarding treatment of her condition.    MEDICAL HISTORY: Past Medical History:  Diagnosis Date  . Adenopathy 10/10/2016   PERICARINAL  . Anemia   . Arthritis   . Asthma   . Cancer ([place])    lung cancer  Small Cell  . COPD (chronic obstructive pulmonary disease) ([place])   . Dyspnea   . Headache    migraines  . Hilar mass 10/10/2016   RIGHT  . History of kidney stones   . Insomnia   . Lung nodules 10/10/2016   RIGHT LOWER LOBE  . Mass of both adrenal glands ([place]) 10/10/2016  . Mood swing   . Pneumonia   . Spinal headache   . Substance abuse ([place])    TOBACCO  . UNSPECIFIED INFECTION OF BONE ANKLE AND FOOT 10/22/2009   Annotation: left ankle Qualifier: Diagnosis of  By: Patsy Baltimore RN, Denise      ALLERGIES:  is allergic to lidocaine.  MEDICATIONS:  Current Outpatient Medications  Medication Sig Dispense Refill  . albuterol (PROVENTIL HFA;VENTOLIN HFA) 108 (90 BASE) MCG/ACT inhaler Inhale 2 puffs into the lungs every 4 (four) hours as needed for wheezing.    . Cyclobenzaprine HCl (FLEXERIL PO) Take 5 mg by mouth 3 (three) times daily as needed (muscle spasms).     . Fluticasone-Salmeterol (ADVAIR) 500-50 MCG/DOSE AEPB Inhale 1 puff into the lungs 2 (two) times daily.    Marland Kitchen LORazepam (ATIVAN) 0.5 MG tablet Take 1 tablet (0.5 mg total) by mouth at bedtime. (Patient taking differently: Take 0.5 mg by mouth at bedtime as needed for anxiety. ) 20 tablet 0  . Multiple Vitamin (MULTIVITAMIN WITH MINERALS) TABS tablet Take 1 tablet by mouth daily.     . sertraline (ZOLOFT) 50 MG tablet Take 50  mg by mouth at bedtime.  5   No current facility-administered medications for this visit.     SURGICAL HISTORY:  Past Surgical History:  Procedure Laterality Date  . ANKLE SURGERY     hardware removal, and initial ankle surgery  . APPENDECTOMY    . LUNG BIOPSY N/A 10/19/2016   Procedure: LUNG BIOPSY;  Surgeon: Grace Isaac, MD;  Location: [facility];  Service: Thoracic;  Laterality: N/A;  . LUNG BIOPSY N/A 02/20/2019   Procedure: LUNG BIOPSY;  Surgeon: Grace Isaac, MD;  Location: [facility];  Service: Thoracic;  Laterality: N/A;  . TONSILLECTOMY    . VIDEO BRONCHOSCOPY WITH ENDOBRONCHIAL ULTRASOUND  N/A 10/19/2016   Procedure: VIDEO BRONCHOSCOPY WITH ENDOBRONCHIAL ULTRASOUND;  Surgeon: Grace Isaac, MD;  Location: [facility];  Service: Thoracic;  Laterality: N/A;  . VIDEO BRONCHOSCOPY WITH ENDOBRONCHIAL ULTRASOUND N/A 02/20/2019   Procedure: VIDEO BRONCHOSCOPY WITH ENDOBRONCHIAL ULTRASOUND;  Surgeon: Grace Isaac, MD;  Location: [facility];  Service: Thoracic;  Laterality: N/A;    REVIEW OF SYSTEMS:  Constitutional: positive for fatigue Eyes: negative Ears, nose, mouth, throat, and face: negative Respiratory: positive for cough Cardiovascular: negative Gastrointestinal: negative Genitourinary:negative Integument/breast: negative Hematologic/lymphatic: negative Musculoskeletal:negative Neurological: negative Behavioral/Psych: negative Endocrine: negative Allergic/Immunologic: negative   PHYSICAL EXAMINATION: General appearance: alert, cooperative, fatigued and no distress Head: Normocephalic, without obvious abnormality, atraumatic Neck: no adenopathy, no JVD, supple, symmetrical, trachea midline and thyroid not enlarged, symmetric, no tenderness/mass/nodules Lymph nodes: Cervical, supraclavicular, and axillary nodes normal. Resp: clear to auscultation bilaterally Back: symmetric, no curvature. ROM normal. No CVA tenderness. Cardio: regular rate and rhythm, S1, S2 normal, no murmur, click, rub or gallop GI: soft, non-tender; bowel sounds normal; no masses,  no organomegaly Extremities: extremities normal, atraumatic, no cyanosis or edema Neurologic: Alert and oriented X 3, normal strength and tone. Normal symmetric reflexes. Normal coordination and gait  ECOG PERFORMANCE STATUS: 1 - Symptomatic but completely ambulatory  Blood pressure 110/60, pulse 95, temperature 98 F (36.7 C), temperature source Oral, resp. rate 19, height 5\' 6"  (1.676 m), weight 221 lb 3.2 oz (100.3 kg), SpO2 95 %.  LABORATORY DATA: Lab Results  Component Value Date   WBC 8.0 02/15/2019   HGB 14.7  02/15/2019   HCT 44.4 02/15/2019   MCV 90.6 02/15/2019   PLT 336 02/15/2019      Chemistry      Component Value Date/Time   NA 138 02/15/2019 1533   NA 139 11/24/2017 0913   K 3.6 02/15/2019 1533   K 3.8 11/24/2017 0913   CL 105 02/15/2019 1533   CO2 23 02/15/2019 1533   CO2 24 11/24/2017 0913   BUN 14 02/15/2019 1533   BUN 22.1 11/24/2017 0913   CREATININE 1.07 (H) 02/15/2019 1533   CREATININE 1.08 (H) 01/15/2019 0942   CREATININE 1.2 (H) 11/24/2017 0913      Component Value Date/Time   CALCIUM 9.4 02/15/2019 1533   CALCIUM 9.7 11/24/2017 0913   ALKPHOS 90 02/15/2019 1533   ALKPHOS 99 11/24/2017 0913   AST 16 02/15/2019 1533   AST 11 (L) 01/15/2019 0942   AST 15 11/24/2017 0913   ALT 11 02/15/2019 1533   ALT 9 01/15/2019 0942   ALT 7 11/24/2017 0913   BILITOT 0.7 02/15/2019 1533   BILITOT 0.3 01/15/2019 0942   BILITOT 0.37 11/24/2017 0913       RADIOGRAPHIC STUDIES: Dg Chest 2 View  Result Date: 02/16/2019 CLINICAL DATA:  Preop for EMB.  History  of lung cancer. EXAM: CHEST - 2 VIEW COMPARISON:  PET-CT 01/28/2019 FINDINGS: The cardiac silhouette, mediastinal and hilar contours are within normal limits and stable. Mild tortuosity and calcification of the thoracic aorta. Stable right hilar soft tissue density and right middle lobe atelectasis. No infiltrates or effusions. No pneumothorax. The bony thorax is intact. IMPRESSION: Stable right hilar prominence and band of right middle lobe atelectasis. Electronically Signed   By: Marijo Sanes M.D.   On: 02/16/2019 14:08   Mr Jeri Cos XB Contrast  Result Date: 02/11/2019 CLINICAL DATA:  Small-cell lung cancer. History of prophylactic cranial irradiation. EXAM: MRI HEAD WITHOUT AND WITH CONTRAST TECHNIQUE: Multiplanar, multiecho pulse sequences of the brain and surrounding structures were obtained without and with intravenous contrast. CONTRAST:  10 mL Gadavist COMPARISON:  04/05/2017 FINDINGS: Brain: There is no evidence of  acute infarct, intracranial hemorrhage, mass, midline shift, or extra-axial fluid collection. There is progressive, now confluent T2 hyperintensity in the cerebral white matter bilaterally. No abnormal enhancement is identified. There is mild cerebral atrophy. Vascular: Major intracranial vascular flow voids are preserved. Skull and upper cervical spine: Unremarkable bone marrow signal. Sinuses/Orbits: Unremarkable orbits. Paranasal sinuses and mastoid air cells are clear. Other: None. IMPRESSION: 1. No evidence of intracranial metastases. 2. Progressive cerebral white matter changes consistent with interval cranial irradiation. Electronically Signed   By: Logan Bores M.D.   On: 02/11/2019 10:10   Nm Pet Image Restag (ps) Skull Base To Thigh  Result Date: 01/28/2019 CLINICAL DATA:  Initial treatment strategy for small-cell lung cancer, right-sided. History of treated lung cancer in 2017. EXAM: NUCLEAR MEDICINE PET SKULL BASE TO THIGH TECHNIQUE: 11.5 mCi F-18 FDG was injected intravenously. Full-ring PET imaging was performed from the skull base to thigh after the radiotracer. CT data was obtained and used for attenuation correction and anatomic localization. Fasting blood glucose: 82 mg/dl COMPARISON:  Chest CT 01/15/2019.  Most recent PET 10/21/2016 FINDINGS: Mediastinal blood pool activity: SUV max 3.2 NECK: No areas of abnormal hypermetabolism. Incidental CT findings: No cervical adenopathy. CHEST: Hypermetabolism corresponding to the previously described right lower lobe pulmonary nodules. The more central nodule measures 11 mm and a S.U.V. max of 4.7 on image 31/8. The more peripheral measures 9 mm and a S.U.V. max of 4.5 on image 29/8. Hypermetabolism corresponding to confluent adenopathy/soft tissue thickening about the right-side of the mediastinum, including surrounding the bronchus intermedius. This measures on the order of a S.U.V. max of 16.4, including on image 72/4. A focus of hypermetabolism  within the distal esophagus, in the region of a tiny hiatal hernia, is favored to be physiologic or related to esophagitis. Example at a S.U.V. max of 8.5 on image 96/4. Incidental CT findings: Lad coronary artery calcification. Radiation changes in the medial right lung. ABDOMEN/PELVIS: Low-level hypermetabolism corresponding to a 3.3 cm low-density right adrenal mass. This measures a S.U.V. max of 4.9 including on image 109/4. No suspicious abdominopelvic nodal or parenchymal hypermetabolism. Incidental CT findings: Abdominal aortic atherosclerosis. Mild pelvic floor laxity. SKELETON: No abnormal marrow activity. Incidental CT findings: none IMPRESSION: 1. Findings consistent with recurrent/metastatic disease within the right-side of the mediastinum and right lower lobe. 2. No evidence of extrathoracic hypermetabolic metastasis. 3. Right adrenal adenoma. 4. Tiny hiatal hernia. Likely physiologic or inflammatory hypermetabolism about the distal esophagus and hiatal hernia. 5. Coronary artery atherosclerosis. Aortic Atherosclerosis (ICD10-I70.0). Electronically Signed   By: Abigail Miyamoto M.D.   On: 01/28/2019 16:44    ASSESSMENT AND PLAN:  This is  a very pleasant 68 years old white female with recurrent small cell lung cancer that was initially diagnosed as limited stage disease status post a course of systemic chemotherapy with cisplatin and etoposide for 6 cycles concurrent with radiation. She tolerated her treatment well except for fatigue. She is also status post prophylactic cranial irradiation. The patient has been in observation since March 2018. She was found on recent imaging studies to have evidence for disease recurrence with right lower lobe pulmonary nodules in addition to mediastinal lymphadenopathy. Repeat bronchoscopy with endobronchial ultrasound and biopsy confirmed recurrence of his small cell lung cancer. I had a lengthy discussion with the patient and her husband today about her current  disease stage, prognosis and treatment options. I discussed with the patient the option of palliative care versus palliative systemic chemotherapy with carboplatin for AUC of 5 on day 1, etoposide 100 mg/M2 on days 1, 2 and 3 with Tecentriq 1200 mg IV every 3 weeks with Neulasta support. The patient is interested in proceeding with systemic chemotherapy.  I discussed with her the adverse effect of this treatment including but not limited to alopecia, myelosuppression, nausea and vomiting, peripheral neuropathy, liver or renal dysfunction as well as the adverse effect of the immunotherapy including skin rash, diarrhea, inflammation of the lung, kidney, liver, renal or thyroid dysfunction. The patient is expected to start the first cycle of this treatment next week. I will see her back for follow-up visit in 4 weeks with the start of cycle #2. I will call her pharmacy with prescription for Compazine 10 mg p.o. every 6 hours as needed for nausea. She was advised to call immediately if she has any concerning symptoms in the interval. She was advised to quit smoking and offered her a smoke cessation program. The patient was also advised to call immediately if she has any concerning symptoms in the interval. The patient voices understanding of current disease status and treatment options and is in agreement with the current care plan.  All questions were answered. The patient knows to call the clinic with any problems, questions or concerns. We can certainly see the patient much sooner if necessary.  Disclaimer: This note was dictated with voice recognition software. Similar sounding words can inadvertently be transcribed and may not be corrected upon review.

## 2019-02-23 DIAGNOSIS — Z7189 Other specified counseling: Secondary | ICD-10-CM | POA: Insufficient documentation

## 2019-02-23 DIAGNOSIS — Z5112 Encounter for antineoplastic immunotherapy: Secondary | ICD-10-CM | POA: Insufficient documentation

## 2019-02-23 MED ORDER — PROCHLORPERAZINE MALEATE 10 MG PO TABS: 10.0000 mg | ORAL_TABLET | Freq: Four times a day (QID) | ORAL | 0 refills | Status: DC | PRN

## 2019-02-28 ENCOUNTER — Other Ambulatory Visit: Payer: Self-pay | Admitting: *Deleted

## 2019-02-28 NOTE — Progress Notes (Signed)
The proposed treatment discussed in cancer conference 02/28/2019 is for discussion purpose only and is not a binding recommendation.  The patient was not physically examined nor present for their treatment options.  Therefore, final treatment plans cannot be decided.

## 2019-03-01 ENCOUNTER — Ambulatory Visit: Payer: Medicare Other | Admitting: Internal Medicine

## 2019-03-05 ENCOUNTER — Telehealth: Payer: Self-pay | Admitting: *Deleted

## 2019-03-05 ENCOUNTER — Inpatient Hospital Stay: Payer: Medicare Other

## 2019-03-05 ENCOUNTER — Telehealth: Payer: Self-pay | Admitting: Internal Medicine

## 2019-03-05 NOTE — Telephone Encounter (Signed)
Pt's appts rescheduled for Wednesday, Thursday, Friday of this week

## 2019-03-05 NOTE — Telephone Encounter (Signed)
Received call from Amy, RN -charge nurse in infusion. Pt's appts for the week were cancelled as pt had not shown up for labs. Her appts will need to be rescheduled.

## 2019-03-05 NOTE — Telephone Encounter (Signed)
Scheduled appt per 3/17 sch message - pt is aware of appt

## 2019-03-06 ENCOUNTER — Inpatient Hospital Stay: Payer: Medicare Other

## 2019-03-06 ENCOUNTER — Other Ambulatory Visit: Payer: Self-pay

## 2019-03-06 VITALS — BP 112/86 | HR 100 | Temp 99.0°F | Resp 20 | Wt 219.2 lb

## 2019-03-06 DIAGNOSIS — Z5189 Encounter for other specified aftercare: Secondary | ICD-10-CM | POA: Diagnosis not present

## 2019-03-06 DIAGNOSIS — C3491 Malignant neoplasm of unspecified part of right bronchus or lung: Secondary | ICD-10-CM

## 2019-03-06 DIAGNOSIS — C3411 Malignant neoplasm of upper lobe, right bronchus or lung: Secondary | ICD-10-CM | POA: Diagnosis not present

## 2019-03-06 DIAGNOSIS — Z5112 Encounter for antineoplastic immunotherapy: Secondary | ICD-10-CM | POA: Diagnosis not present

## 2019-03-06 DIAGNOSIS — R5382 Chronic fatigue, unspecified: Secondary | ICD-10-CM

## 2019-03-06 DIAGNOSIS — Z5111 Encounter for antineoplastic chemotherapy: Secondary | ICD-10-CM | POA: Diagnosis not present

## 2019-03-06 DIAGNOSIS — Z79899 Other long term (current) drug therapy: Secondary | ICD-10-CM | POA: Diagnosis not present

## 2019-03-06 DIAGNOSIS — Z923 Personal history of irradiation: Secondary | ICD-10-CM | POA: Diagnosis not present

## 2019-03-06 LAB — CBC WITH DIFFERENTIAL (CANCER CENTER ONLY)
Abs Immature Granulocytes: 0.02 10*3/uL (ref 0.00–0.07)
Basophils Absolute: 0.1 10*3/uL (ref 0.0–0.1)
Basophils Relative: 1 %
Eosinophils Absolute: 0.3 10*3/uL (ref 0.0–0.5)
Eosinophils Relative: 3 %
HCT: 41.7 % (ref 36.0–46.0)
Hemoglobin: 14 g/dL (ref 12.0–15.0)
Immature Granulocytes: 0 %
Lymphocytes Relative: 11 %
Lymphs Abs: 0.9 10*3/uL (ref 0.7–4.0)
MCH: 30.1 pg (ref 26.0–34.0)
MCHC: 33.6 g/dL (ref 30.0–36.0)
MCV: 89.7 fL (ref 80.0–100.0)
Monocytes Absolute: 0.5 10*3/uL (ref 0.1–1.0)
Monocytes Relative: 6 %
Neutro Abs: 6.5 10*3/uL (ref 1.7–7.7)
Neutrophils Relative %: 79 %
Platelet Count: 321 10*3/uL (ref 150–400)
RBC: 4.65 MIL/uL (ref 3.87–5.11)
RDW: 12.8 % (ref 11.5–15.5)
WBC Count: 8.2 10*3/uL (ref 4.0–10.5)
nRBC: 0 % (ref 0.0–0.2)

## 2019-03-06 LAB — CMP (CANCER CENTER ONLY)
ALT: 8 U/L (ref 0–44)
AST: 12 U/L — ABNORMAL LOW (ref 15–41)
Albumin: 3.3 g/dL — ABNORMAL LOW (ref 3.5–5.0)
Alkaline Phosphatase: 123 U/L (ref 38–126)
Anion gap: 13 (ref 5–15)
BUN: 14 mg/dL (ref 8–23)
CO2: 22 mmol/L (ref 22–32)
Calcium: 9.2 mg/dL (ref 8.9–10.3)
Chloride: 106 mmol/L (ref 98–111)
Creatinine: 1.08 mg/dL — ABNORMAL HIGH (ref 0.44–1.00)
GFR, Est AFR Am: >60 mL/min (ref >60)
GFR, Estimated: 54 mL/min — ABNORMAL LOW (ref >60)
Glucose, Bld: 91 mg/dL (ref 70–99)
Potassium: 3.9 mmol/L (ref 3.5–5.1)
Sodium: 141 mmol/L (ref 135–145)
Total Bilirubin: 0.4 mg/dL (ref 0.3–1.2)
Total Protein: 6.9 g/dL (ref 6.5–8.1)

## 2019-03-06 LAB — TSH: TSH: 3.154 u[IU]/mL (ref 0.308–3.960)

## 2019-03-06 MED ORDER — SODIUM CHLORIDE 0.9 % IV SOLN
Freq: Once | INTRAVENOUS | Status: AC
  Administered 2019-03-06: 13:00:00 via INTRAVENOUS
  Filled 2019-03-06: qty 5

## 2019-03-06 MED ORDER — SODIUM CHLORIDE 0.9 % IV SOLN
Freq: Once | INTRAVENOUS | Status: AC
  Administered 2019-03-06: 12:00:00 via INTRAVENOUS
  Filled 2019-03-06: qty 250

## 2019-03-06 MED ORDER — PALONOSETRON HCL INJECTION 0.25 MG/5ML
INTRAVENOUS | Status: AC
  Filled 2019-03-06: qty 5

## 2019-03-06 MED ORDER — SODIUM CHLORIDE 0.9 % IV SOLN
200.0000 mg | Freq: Once | INTRAVENOUS | Status: AC
  Administered 2019-03-06: 200 mg via INTRAVENOUS
  Filled 2019-03-06: qty 10

## 2019-03-06 MED ORDER — SODIUM CHLORIDE 0.9 % IV SOLN
540.0000 mg | Freq: Once | INTRAVENOUS | Status: AC
  Administered 2019-03-06: 540 mg via INTRAVENOUS
  Filled 2019-03-06: qty 54

## 2019-03-06 MED ORDER — PALONOSETRON HCL INJECTION 0.25 MG/5ML
0.2500 mg | Freq: Once | INTRAVENOUS | Status: AC
  Administered 2019-03-06: 0.25 mg via INTRAVENOUS

## 2019-03-06 MED ORDER — SODIUM CHLORIDE 0.9 % IV SOLN
1200.0000 mg | Freq: Once | INTRAVENOUS | Status: AC
  Administered 2019-03-06: 1200 mg via INTRAVENOUS
  Filled 2019-03-06: qty 20

## 2019-03-06 NOTE — Patient Instructions (Signed)
[facility] Discharge Instructions for Patients Receiving Chemotherapy   Today you received the following chemotherapy agents Atezolizumab (TECENTRIQ), Carboplatin (PARAPLATIN) & Etoposide (VEPESID).  To help prevent nausea and vomiting after your treatment, we encourage you to take your nausea medication as prescribed.   If you develop nausea and vomiting that is not controlled by your nausea medication, call the clinic.   BELOW ARE SYMPTOMS THAT SHOULD BE REPORTED IMMEDIATELY:  *FEVER GREATER THAN 100.5 F  *CHILLS WITH OR WITHOUT FEVER  NAUSEA AND VOMITING THAT IS NOT CONTROLLED WITH YOUR NAUSEA MEDICATION  *UNUSUAL SHORTNESS OF BREATH  *UNUSUAL BRUISING OR BLEEDING  TENDERNESS IN MOUTH AND THROAT WITH OR WITHOUT PRESENCE OF ULCERS  *URINARY PROBLEMS  *BOWEL PROBLEMS  UNUSUAL RASH Items with * indicate a potential emergency and should be followed up as soon as possible.  Feel free to call the clinic should you have any questions or concerns. The clinic phone number is [phone].  Please show the [facility] at check-in to the Emergency Department and triage nurse.  Atezolizumab injection What is this medicine? ATEZOLIZUMAB (a te zoe LIZ ue mab) is a monoclonal antibody. It is used to treat bladder cancer (urothelial cancer), non-small cell lung cancer, small cell lung cancer, and breast cancer. This medicine may be used for other purposes; ask your health care provider or pharmacist if you have questions. COMMON BRAND NAME(S): Tecentriq What should I tell my health care provider before I take this medicine? They need to know if you have any of these conditions: -diabetes -immune system problems -infection -inflammatory bowel disease -liver disease -lung or breathing disease -lupus -nervous system problems like myasthenia gravis or Guillain-Barre syndrome -organ transplant -an unusual or allergic reaction to atezolizumab, other medicines,  foods, dyes, or preservatives -pregnant or trying to get pregnant -breast-feeding How should I use this medicine? This medicine is for infusion into a vein. It is given by a health care professional in a hospital or clinic setting. A special MedGuide will be given to you before each treatment. Be sure to read this information carefully each time. Talk to your pediatrician regarding the use of this medicine in children. Special care may be needed. Overdosage: If you think you have taken too much of this medicine contact a poison control center or emergency room at once. NOTE: This medicine is only for you. Do not share this medicine with others. What if I miss a dose? It is important not to miss your dose. Call your doctor or health care professional if you are unable to keep an appointment. What may interact with this medicine? Interactions have not been studied. This list may not describe all possible interactions. Give your health care provider a list of all the medicines, herbs, non-prescription drugs, or dietary supplements you use. Also tell them if you smoke, drink alcohol, or use illegal drugs. Some items may interact with your medicine. What should I watch for while using this medicine? Your condition will be monitored carefully while you are receiving this medicine. You may need blood work done while you are taking this medicine. Do not become pregnant while taking this medicine or for at least 5 months after stopping it. Women should inform their doctor if they wish to become pregnant or think they might be pregnant. There is a potential for serious side effects to an unborn child. Talk to your health care professional or pharmacist for more information. Do not breast-feed an infant while taking this  medicine or for at least 5 months after the last dose. What side effects may I notice from receiving this medicine? Side effects that you should report to your doctor or health care  professional as soon as possible: -allergic reactions like skin rash, itching or hives, swelling of the face, lips, or tongue -black, tarry stools -bloody or watery diarrhea -breathing problems -changes in vision -chest pain or chest tightness -chills -facial flushing -fever -headache -signs and symptoms of high blood sugar such as dizziness; dry mouth; dry skin; fruity breath; nausea; stomach pain; increased hunger or thirst; increased urination -signs and symptoms of liver injury like dark yellow or brown urine; general ill feeling or flu-like symptoms; light-colored stools; loss of appetite; nausea; right upper belly pain; unusually weak or tired; yellowing of the eyes or skin -stomach pain -trouble passing urine or change in the amount of urine Side effects that usually do not require medical attention (report to your doctor or health care professional if they continue or are bothersome): -cough -diarrhea -joint pain -muscle pain -muscle weakness -tiredness -weight loss This list may not describe all possible side effects. Call your doctor for medical advice about side effects. You may report side effects to FDA at 1-800-FDA-1088. Where should I keep my medicine? This drug is given in a hospital or clinic and will not be stored at home. NOTE: This sheet is a summary. It may not cover all possible information. If you have questions about this medicine, talk to your doctor, pharmacist, or health care provider.  2019 Elsevier/Gold Standard (2018-03-09 09:33:38)  Coronavirus (COVID-19) Are you at risk?  Are you at risk for the Coronavirus (COVID-19)?  To be considered HIGH RISK for Coronavirus (COVID-19), you have to meet the following criteria:  . Traveled to [place], [place], [place], [place] or [place]; or in the [place] to [place], [place], [place], or [place]; and have fever, cough, and shortness of breath within the last 2 weeks of travel OR . Been in close  contact with a person diagnosed with COVID-19 within the last 2 weeks and have fever, cough, and shortness of breath . IF YOU DO NOT MEET THESE CRITERIA, YOU ARE CONSIDERED LOW RISK FOR COVID-19.  What to do if you are HIGH RISK for COVID-19?  Marland Kitchen If you are having a medical emergency, call 911. . Seek medical care right away. Before you go to a doctor's office, urgent care or emergency department, call ahead and tell them about your recent travel, contact with someone diagnosed with COVID-19, and your symptoms. You should receive instructions from your physician's office regarding next steps of care.  . When you arrive at healthcare provider, tell the healthcare staff immediately you have returned from visiting [place], [place], [place], [place] or [place]; or traveled in the [place] to [place], [place], [place], or [place]; in the last two weeks or you have been in close contact with a person diagnosed with COVID-19 in the last 2 weeks.   . Tell the health care staff about your symptoms: fever, cough and shortness of breath. . After you have been seen by a medical provider, you will be either: o Tested for (COVID-19) and discharged home on quarantine except to seek medical care if symptoms worsen, and asked to  - Stay home and avoid contact with others until you get your results (4-5 days)  - Avoid travel on public transportation if possible (such as bus, train, or airplane) or o  Sent to the Emergency Department by EMS for evaluation, COVID-19 testing, and possible admission depending on your condition and test results.  What to do if you are LOW RISK for COVID-19?  Reduce your risk of any infection by using the same precautions used for avoiding the common cold or flu:  Marland Kitchen Wash your hands often with soap and warm water for at least 20 seconds.  If soap and water are not readily available, use an alcohol-based hand sanitizer with at least 60% alcohol.  . If coughing or sneezing, cover  your mouth and nose by coughing or sneezing into the elbow areas of your shirt or coat, into a tissue or into your sleeve (not your hands). . Avoid shaking hands with others and consider head nods or verbal greetings only. . Avoid touching your eyes, nose, or mouth with unwashed hands.  . Avoid close contact with people who are sick. . Avoid places or events with large numbers of people in one location, like concerts or sporting events. . Carefully consider travel plans you have or are making. . If you are planning any travel outside or inside the [place], visit the CDC's Travelers' Health webpage for the latest health notices. . If you have some symptoms but not all symptoms, continue to monitor at home and seek medical attention if your symptoms worsen. . If you are having a medical emergency, call 911.   [place] / e-Visit: [email or web address]         [facility]: [facility]: [phone]                   [facility]: [phone]

## 2019-03-06 NOTE — Progress Notes (Signed)
Per Dr. Julien Nordmann, okay to leave PIV access in for treatment tomorrow.

## 2019-03-07 ENCOUNTER — Inpatient Hospital Stay: Payer: Medicare Other

## 2019-03-07 ENCOUNTER — Other Ambulatory Visit: Payer: Self-pay

## 2019-03-07 VITALS — BP 118/70 | HR 120 | Temp 98.3°F | Resp 20

## 2019-03-07 DIAGNOSIS — Z5112 Encounter for antineoplastic immunotherapy: Secondary | ICD-10-CM | POA: Diagnosis not present

## 2019-03-07 DIAGNOSIS — Z5111 Encounter for antineoplastic chemotherapy: Secondary | ICD-10-CM | POA: Diagnosis not present

## 2019-03-07 DIAGNOSIS — Z5189 Encounter for other specified aftercare: Secondary | ICD-10-CM | POA: Diagnosis not present

## 2019-03-07 DIAGNOSIS — Z923 Personal history of irradiation: Secondary | ICD-10-CM | POA: Diagnosis not present

## 2019-03-07 DIAGNOSIS — Z79899 Other long term (current) drug therapy: Secondary | ICD-10-CM | POA: Diagnosis not present

## 2019-03-07 DIAGNOSIS — C3491 Malignant neoplasm of unspecified part of right bronchus or lung: Secondary | ICD-10-CM

## 2019-03-07 DIAGNOSIS — C3411 Malignant neoplasm of upper lobe, right bronchus or lung: Secondary | ICD-10-CM | POA: Diagnosis not present

## 2019-03-07 MED ORDER — SODIUM CHLORIDE 0.9 % IV SOLN
Freq: Once | INTRAVENOUS | Status: AC
  Administered 2019-03-07: 08:00:00 via INTRAVENOUS
  Filled 2019-03-07: qty 250

## 2019-03-07 MED ORDER — DEXAMETHASONE SODIUM PHOSPHATE 10 MG/ML IJ SOLN
INTRAMUSCULAR | Status: AC
  Filled 2019-03-07: qty 1

## 2019-03-07 MED ORDER — PROCHLORPERAZINE MALEATE 10 MG PO TABS
10.0000 mg | ORAL_TABLET | Freq: Four times a day (QID) | ORAL | Status: DC | PRN
  Administered 2019-03-07: 10 mg via ORAL

## 2019-03-07 MED ORDER — SODIUM CHLORIDE 0.9 % IV SOLN
92.0000 mg/m2 | Freq: Once | INTRAVENOUS | Status: AC
  Administered 2019-03-07: 200 mg via INTRAVENOUS
  Filled 2019-03-07: qty 10

## 2019-03-07 MED ORDER — SODIUM CHLORIDE 0.9 % IV SOLN: 10.0000 mg | Freq: Once | INTRAVENOUS | Status: DC

## 2019-03-07 MED ORDER — DEXAMETHASONE SODIUM PHOSPHATE 10 MG/ML IJ SOLN
10.0000 mg | Freq: Once | INTRAMUSCULAR | Status: AC
  Administered 2019-03-07: 10 mg via INTRAVENOUS

## 2019-03-07 MED ORDER — PROCHLORPERAZINE MALEATE 10 MG PO TABS
ORAL_TABLET | ORAL | Status: AC
  Filled 2019-03-07: qty 1

## 2019-03-07 NOTE — Patient Instructions (Signed)
[facility] Discharge Instructions for Patients Receiving Chemotherapy  Today you received the following chemotherapy agents:  Etoposide.  To help prevent nausea and vomiting after your treatment, we encourage you to take your nausea medication as directed.   If you develop nausea and vomiting that is not controlled by your nausea medication, call the clinic.   BELOW ARE SYMPTOMS THAT SHOULD BE REPORTED IMMEDIATELY:  *FEVER GREATER THAN 100.5 F  *CHILLS WITH OR WITHOUT FEVER  NAUSEA AND VOMITING THAT IS NOT CONTROLLED WITH YOUR NAUSEA MEDICATION  *UNUSUAL SHORTNESS OF BREATH  *UNUSUAL BRUISING OR BLEEDING  TENDERNESS IN MOUTH AND THROAT WITH OR WITHOUT PRESENCE OF ULCERS  *URINARY PROBLEMS  *BOWEL PROBLEMS  UNUSUAL RASH Items with * indicate a potential emergency and should be followed up as soon as possible.  Feel free to call the clinic you have any questions or concerns. The clinic phone number is [phone].  Please show the [facility] at check-in to the Emergency Department and triage nurse.

## 2019-03-07 NOTE — Progress Notes (Signed)
Patient heart rate elevated to 120. Per Dr. Delene Ruffini okay to receive treatment today and also get 1L normal saline hydration.

## 2019-03-08 ENCOUNTER — Encounter: Payer: Self-pay | Admitting: *Deleted

## 2019-03-08 ENCOUNTER — Other Ambulatory Visit: Payer: Self-pay | Admitting: Internal Medicine

## 2019-03-08 ENCOUNTER — Inpatient Hospital Stay: Payer: Medicare Other

## 2019-03-08 ENCOUNTER — Telehealth: Payer: Self-pay | Admitting: *Deleted

## 2019-03-08 ENCOUNTER — Other Ambulatory Visit: Payer: Self-pay

## 2019-03-08 VITALS — BP 120/80 | HR 100 | Temp 98.7°F | Resp 18

## 2019-03-08 DIAGNOSIS — C3491 Malignant neoplasm of unspecified part of right bronchus or lung: Secondary | ICD-10-CM

## 2019-03-08 DIAGNOSIS — Z5189 Encounter for other specified aftercare: Secondary | ICD-10-CM | POA: Diagnosis not present

## 2019-03-08 DIAGNOSIS — Z79899 Other long term (current) drug therapy: Secondary | ICD-10-CM | POA: Diagnosis not present

## 2019-03-08 DIAGNOSIS — C3411 Malignant neoplasm of upper lobe, right bronchus or lung: Secondary | ICD-10-CM | POA: Diagnosis not present

## 2019-03-08 DIAGNOSIS — Z5112 Encounter for antineoplastic immunotherapy: Secondary | ICD-10-CM | POA: Diagnosis not present

## 2019-03-08 DIAGNOSIS — Z5111 Encounter for antineoplastic chemotherapy: Secondary | ICD-10-CM | POA: Diagnosis not present

## 2019-03-08 DIAGNOSIS — Z923 Personal history of irradiation: Secondary | ICD-10-CM | POA: Diagnosis not present

## 2019-03-08 MED ORDER — DEXAMETHASONE SODIUM PHOSPHATE 10 MG/ML IJ SOLN
10.0000 mg | Freq: Once | INTRAMUSCULAR | Status: AC
  Administered 2019-03-08: 10 mg via INTRAVENOUS

## 2019-03-08 MED ORDER — SODIUM CHLORIDE 0.9 % IV SOLN
Freq: Once | INTRAVENOUS | Status: AC
  Administered 2019-03-08: 14:00:00 via INTRAVENOUS
  Filled 2019-03-08: qty 250

## 2019-03-08 MED ORDER — SODIUM CHLORIDE 0.9 % IV SOLN
93.0000 mg/m2 | Freq: Once | INTRAVENOUS | Status: AC
  Administered 2019-03-08: 200 mg via INTRAVENOUS
  Filled 2019-03-08: qty 10

## 2019-03-08 MED ORDER — DEXAMETHASONE SODIUM PHOSPHATE 10 MG/ML IJ SOLN
INTRAMUSCULAR | Status: AC
  Filled 2019-03-08: qty 1

## 2019-03-08 NOTE — Telephone Encounter (Signed)
Patient is asking if she should and can proceed with getting a port placed.  Routing to MD and primary desk nurse to get order.

## 2019-03-08 NOTE — Patient Instructions (Addendum)
[facility] Discharge Instructions for Patients Receiving Chemotherapy  Today you received the following chemotherapy agents:  Etoposide.  To help prevent nausea and vomiting after your treatment, we encourage you to take your nausea medication as directed.   If you develop nausea and vomiting that is not controlled by your nausea medication, call the clinic.   BELOW ARE SYMPTOMS THAT SHOULD BE REPORTED IMMEDIATELY:  *FEVER GREATER THAN 100.5 F  *CHILLS WITH OR WITHOUT FEVER  NAUSEA AND VOMITING THAT IS NOT CONTROLLED WITH YOUR NAUSEA MEDICATION  *UNUSUAL SHORTNESS OF BREATH  *UNUSUAL BRUISING OR BLEEDING  TENDERNESS IN MOUTH AND THROAT WITH OR WITHOUT PRESENCE OF ULCERS  *URINARY PROBLEMS  *BOWEL PROBLEMS  UNUSUAL RASH Items with * indicate a potential emergency and should be followed up as soon as possible.  Feel free to call the clinic you have any questions or concerns. The clinic phone number is [phone].  Please show the [facility] at check-in to the Emergency Department and triage nurse.  Coronavirus (COVID-19) Are you at risk?  Are you at risk for the Coronavirus (COVID-19)?  To be considered HIGH RISK for Coronavirus (COVID-19), you have to meet the following criteria:  . Traveled to [place], [place], [place], [place] or [place]; or in the [place] to [place], [place], [place], or [place]; and have fever, cough, and shortness of breath within the last 2 weeks of travel OR . Been in close contact with a person diagnosed with COVID-19 within the last 2 weeks and have fever, cough, and shortness of breath . IF YOU DO NOT MEET THESE CRITERIA, YOU ARE CONSIDERED LOW RISK FOR COVID-19.  What to do if you are HIGH RISK for COVID-19?  Marland Kitchen If you are having a medical emergency, call 911. . Seek medical care right away. Before you go to a doctor's office, urgent care or emergency department, call ahead and tell them about your recent  travel, contact with someone diagnosed with COVID-19, and your symptoms. You should receive instructions from your physician's office regarding next steps of care.  . When you arrive at healthcare provider, tell the healthcare staff immediately you have returned from visiting [place], [place], [place], [place] or [place]; or traveled in the [place] to [place], [place], [place], or [place]; in the last two weeks or you have been in close contact with a person diagnosed with COVID-19 in the last 2 weeks.   . Tell the health care staff about your symptoms: fever, cough and shortness of breath. . After you have been seen by a medical provider, you will be either: o Tested for (COVID-19) and discharged home on quarantine except to seek medical care if symptoms worsen, and asked to  - Stay home and avoid contact with others until you get your results (4-5 days)  - Avoid travel on public transportation if possible (such as bus, train, or airplane) or o Sent to the Emergency Department by EMS for evaluation, COVID-19 testing, and possible admission depending on your condition and test results.  What to do if you are LOW RISK for COVID-19?  Reduce your risk of any infection by using the same precautions used for avoiding the common cold or flu:  Marland Kitchen Wash your hands often with soap and warm water for at least 20 seconds.  If soap and water are not readily available, use an alcohol-based hand sanitizer with at least 60% alcohol.  . If coughing or sneezing,  cover your mouth and nose by coughing or sneezing into the elbow areas of your shirt or coat, into a tissue or into your sleeve (not your hands). . Avoid shaking hands with others and consider head nods or verbal greetings only. . Avoid touching your eyes, nose, or mouth with unwashed hands.  . Avoid close contact with people who are sick. . Avoid places or events with large numbers of people in one location, like concerts or sporting  events. . Carefully consider travel plans you have or are making. . If you are planning any travel outside or inside the [place], visit the CDC's Travelers' Health webpage for the latest health notices. . If you have some symptoms but not all symptoms, continue to monitor at home and seek medical attention if your symptoms worsen. . If you are having a medical emergency, call 911.   [place] / e-Visit: [email or web address]         [facility]: [facility]: [phone]                   [facility]: [phone]

## 2019-03-09 ENCOUNTER — Inpatient Hospital Stay: Payer: Medicare Other

## 2019-03-09 MED ORDER — PEGFILGRASTIM-CBQV 6 MG/0.6ML ~~LOC~~ SOSY
PREFILLED_SYRINGE | SUBCUTANEOUS | Status: AC
  Filled 2019-03-09: qty 0.6

## 2019-03-11 ENCOUNTER — Other Ambulatory Visit: Payer: Medicare Other

## 2019-03-11 ENCOUNTER — Inpatient Hospital Stay: Payer: Medicare Other

## 2019-03-11 ENCOUNTER — Other Ambulatory Visit: Payer: Self-pay

## 2019-03-11 ENCOUNTER — Other Ambulatory Visit: Payer: Self-pay | Admitting: *Deleted

## 2019-03-11 VITALS — BP 121/80

## 2019-03-11 DIAGNOSIS — C3491 Malignant neoplasm of unspecified part of right bronchus or lung: Secondary | ICD-10-CM

## 2019-03-11 DIAGNOSIS — Z5189 Encounter for other specified aftercare: Secondary | ICD-10-CM | POA: Diagnosis not present

## 2019-03-11 DIAGNOSIS — Z5112 Encounter for antineoplastic immunotherapy: Secondary | ICD-10-CM | POA: Diagnosis not present

## 2019-03-11 DIAGNOSIS — Z79899 Other long term (current) drug therapy: Secondary | ICD-10-CM | POA: Diagnosis not present

## 2019-03-11 DIAGNOSIS — R5382 Chronic fatigue, unspecified: Secondary | ICD-10-CM

## 2019-03-11 DIAGNOSIS — Z5111 Encounter for antineoplastic chemotherapy: Secondary | ICD-10-CM | POA: Diagnosis not present

## 2019-03-11 DIAGNOSIS — C3411 Malignant neoplasm of upper lobe, right bronchus or lung: Secondary | ICD-10-CM | POA: Diagnosis not present

## 2019-03-11 DIAGNOSIS — Z923 Personal history of irradiation: Secondary | ICD-10-CM | POA: Diagnosis not present

## 2019-03-11 LAB — CBC WITH DIFFERENTIAL (CANCER CENTER ONLY)
Abs Immature Granulocytes: 0.03 10*3/uL (ref 0.00–0.07)
Basophils Absolute: 0 10*3/uL (ref 0.0–0.1)
Basophils Relative: 0 %
Eosinophils Absolute: 0.1 10*3/uL (ref 0.0–0.5)
Eosinophils Relative: 1 %
HCT: 40 % (ref 36.0–46.0)
Hemoglobin: 13.4 g/dL (ref 12.0–15.0)
Immature Granulocytes: 1 %
Lymphocytes Relative: 11 %
Lymphs Abs: 0.6 10*3/uL — ABNORMAL LOW (ref 0.7–4.0)
MCH: 29.9 pg (ref 26.0–34.0)
MCHC: 33.5 g/dL (ref 30.0–36.0)
MCV: 89.3 fL (ref 80.0–100.0)
Monocytes Absolute: 0 10*3/uL — ABNORMAL LOW (ref 0.1–1.0)
Monocytes Relative: 0 %
Neutro Abs: 4.5 10*3/uL (ref 1.7–7.7)
Neutrophils Relative %: 87 %
Platelet Count: 237 10*3/uL (ref 150–400)
RBC: 4.48 MIL/uL (ref 3.87–5.11)
RDW: 12.8 % (ref 11.5–15.5)
WBC Count: 5.1 10*3/uL (ref 4.0–10.5)
nRBC: 0 % (ref 0.0–0.2)

## 2019-03-11 LAB — CMP (CANCER CENTER ONLY)
ALT: 10 U/L (ref 0–44)
AST: 13 U/L — ABNORMAL LOW (ref 15–41)
Albumin: 3.2 g/dL — ABNORMAL LOW (ref 3.5–5.0)
Alkaline Phosphatase: 96 U/L (ref 38–126)
Anion gap: 13 (ref 5–15)
BUN: 26 mg/dL — ABNORMAL HIGH (ref 8–23)
CO2: 22 mmol/L (ref 22–32)
Calcium: 8.5 mg/dL — ABNORMAL LOW (ref 8.9–10.3)
Chloride: 103 mmol/L (ref 98–111)
Creatinine: 1.1 mg/dL — ABNORMAL HIGH (ref 0.44–1.00)
GFR, Est AFR Am: >60 mL/min (ref >60)
GFR, Estimated: 53 mL/min — ABNORMAL LOW (ref >60)
Glucose, Bld: 137 mg/dL — ABNORMAL HIGH (ref 70–99)
Potassium: 3.4 mmol/L — ABNORMAL LOW (ref 3.5–5.1)
Sodium: 138 mmol/L (ref 135–145)
Total Bilirubin: 0.4 mg/dL (ref 0.3–1.2)
Total Protein: 6.3 g/dL — ABNORMAL LOW (ref 6.5–8.1)

## 2019-03-11 LAB — TSH: TSH: 5.7 u[IU]/mL — ABNORMAL HIGH (ref 0.308–3.960)

## 2019-03-11 MED ORDER — PEGFILGRASTIM-CBQV 6 MG/0.6ML ~~LOC~~ SOSY
6.0000 mg | PREFILLED_SYRINGE | Freq: Once | SUBCUTANEOUS | Status: AC
  Administered 2019-03-11: 6 mg via SUBCUTANEOUS

## 2019-03-11 MED ORDER — PEGFILGRASTIM-CBQV 6 MG/0.6ML ~~LOC~~ SOSY
PREFILLED_SYRINGE | SUBCUTANEOUS | Status: AC
  Filled 2019-03-11: qty 0.6

## 2019-03-11 NOTE — Patient Instructions (Signed)
Pegfilgrastim injection  What is this medicine?  PEGFILGRASTIM (PEG fil gra stim) is a long-acting granulocyte colony-stimulating factor that stimulates the growth of neutrophils, a type of white blood cell important in the body's fight against infection. It is used to reduce the incidence of fever and infection in patients with certain types of cancer who are receiving chemotherapy that affects the bone marrow, and to increase survival after being exposed to high doses of radiation.  This medicine may be used for other purposes; ask your health care provider or pharmacist if you have questions.  COMMON BRAND NAME(S): Fulphila, Neulasta, UDENYCA  What should I tell my health care provider before I take this medicine?  They need to know if you have any of these conditions:  -kidney disease  -latex allergy  -ongoing radiation therapy  -sickle cell disease  -skin reactions to acrylic adhesives (On-Body Injector only)  -an unusual or allergic reaction to pegfilgrastim, filgrastim, other medicines, foods, dyes, or preservatives  -pregnant or trying to get pregnant  -breast-feeding  How should I use this medicine?  This medicine is for injection under the skin. If you get this medicine at home, you will be taught how to prepare and give the pre-filled syringe or how to use the On-body Injector. Refer to the patient Instructions for Use for detailed instructions. Use exactly as directed. Tell your healthcare provider immediately if you suspect that the On-body Injector may not have performed as intended or if you suspect the use of the On-body Injector resulted in a missed or partial dose.  It is important that you put your used needles and syringes in a special sharps container. Do not put them in a trash can. If you do not have a sharps container, call your pharmacist or healthcare provider to get one.  Talk to your pediatrician regarding the use of this medicine in children. While this drug may be prescribed for  selected conditions, precautions do apply.  Overdosage: If you think you have taken too much of this medicine contact a poison control center or emergency room at once.  NOTE: This medicine is only for you. Do not share this medicine with others.  What if I miss a dose?  It is important not to miss your dose. Call your doctor or health care professional if you miss your dose. If you miss a dose due to an On-body Injector failure or leakage, a new dose should be administered as soon as possible using a single prefilled syringe for manual use.  What may interact with this medicine?  Interactions have not been studied.  Give your health care provider a list of all the medicines, herbs, non-prescription drugs, or dietary supplements you use. Also tell them if you smoke, drink alcohol, or use illegal drugs. Some items may interact with your medicine.  This list may not describe all possible interactions. Give your health care provider a list of all the medicines, herbs, non-prescription drugs, or dietary supplements you use. Also tell them if you smoke, drink alcohol, or use illegal drugs. Some items may interact with your medicine.  What should I watch for while using this medicine?  You may need blood work done while you are taking this medicine.  If you are going to need a MRI, CT scan, or other procedure, tell your doctor that you are using this medicine (On-Body Injector only).  What side effects may I notice from receiving this medicine?  Side effects that you should report to   your doctor or health care professional as soon as possible:  -allergic reactions like skin rash, itching or hives, swelling of the face, lips, or tongue  -back pain  -dizziness  -fever  -pain, redness, or irritation at site where injected  -pinpoint red spots on the skin  -red or dark-brown urine  -shortness of breath or breathing problems  -stomach or side pain, or pain at the shoulder  -swelling  -tiredness  -trouble passing urine or  change in the amount of urine  Side effects that usually do not require medical attention (report to your doctor or health care professional if they continue or are bothersome):  -bone pain  -muscle pain  This list may not describe all possible side effects. Call your doctor for medical advice about side effects. You may report side effects to FDA at 1-800-FDA-1088.  Where should I keep my medicine?  Keep out of the reach of children.  If you are using this medicine at home, you will be instructed on how to store it. Throw away any unused medicine after the expiration date on the label.  NOTE: This sheet is a summary. It may not cover all possible information. If you have questions about this medicine, talk to your doctor, pharmacist, or health care provider.   2019 Elsevier/Gold Standard (2018-03-12 16:57:08)

## 2019-03-12 ENCOUNTER — Inpatient Hospital Stay: Payer: Medicare Other

## 2019-03-14 ENCOUNTER — Telehealth: Payer: Self-pay | Admitting: Medical Oncology

## 2019-03-14 NOTE — Telephone Encounter (Signed)
Fell in bathroom yesterday and  "cracked rib on the left side under left breast "Received VM from pt that she fell and has a cracked rib. She cannot take a deep breath due to pain.SAhe cannot sleep or get in a good position. She took [name]'s tylenol and it helps a little. She requests pain med -" Vicodan". Her "bones hurt all over ,but the pain is worse where she "cracked her rib". Per Julien Nordmann I instructed pt to take Ibuprofen 200 mg tid prn pain and tylenol in between. She understands instructions.

## 2019-03-19 ENCOUNTER — Inpatient Hospital Stay: Payer: Medicare Other

## 2019-03-19 ENCOUNTER — Telehealth: Payer: Self-pay | Admitting: *Deleted

## 2019-03-19 NOTE — Telephone Encounter (Signed)
Received TC from patient regarding the pain she is having s/p fall a week or so ago. She felt like she cracked her a rib on the left side.  She is asking  for something stronger for pain other than tylenol and ibuprofan. Dr. Julien Nordmann suggested taping the rib area that is painful and also adding topical pain patch such as salonpas or an equivalent. TCT patient with the above suggestions. Pt voiced understanding.

## 2019-03-20 ENCOUNTER — Telehealth: Payer: Self-pay | Admitting: Internal Medicine

## 2019-03-20 NOTE — Telephone Encounter (Signed)
Called patient regarding rescheduling upcoming appointments, left patient a voicemail.

## 2019-03-21 ENCOUNTER — Other Ambulatory Visit: Payer: Self-pay | Admitting: Physician Assistant

## 2019-03-22 ENCOUNTER — Ambulatory Visit (HOSPITAL_COMMUNITY): Payer: Medicare Other

## 2019-03-22 ENCOUNTER — Inpatient Hospital Stay (HOSPITAL_COMMUNITY): Admission: RE | Admit: 2019-03-22 | Payer: Medicare Other | Source: Ambulatory Visit

## 2019-03-25 ENCOUNTER — Other Ambulatory Visit: Payer: Self-pay | Admitting: Medical Oncology

## 2019-03-25 ENCOUNTER — Encounter: Payer: Self-pay | Admitting: Internal Medicine

## 2019-03-25 ENCOUNTER — Inpatient Hospital Stay: Payer: Medicare Other | Attending: Family Medicine

## 2019-03-25 ENCOUNTER — Inpatient Hospital Stay: Payer: Medicare Other

## 2019-03-25 ENCOUNTER — Other Ambulatory Visit: Payer: Self-pay

## 2019-03-25 ENCOUNTER — Inpatient Hospital Stay (HOSPITAL_BASED_OUTPATIENT_CLINIC_OR_DEPARTMENT_OTHER): Payer: Medicare Other | Admitting: Internal Medicine

## 2019-03-25 VITALS — HR 99 | Temp 98.5°F

## 2019-03-25 VITALS — BP 120/80 | HR 117 | Resp 18 | Ht 66.0 in | Wt 220.4 lb

## 2019-03-25 DIAGNOSIS — G4700 Insomnia, unspecified: Secondary | ICD-10-CM | POA: Diagnosis not present

## 2019-03-25 DIAGNOSIS — C3491 Malignant neoplasm of unspecified part of right bronchus or lung: Secondary | ICD-10-CM

## 2019-03-25 DIAGNOSIS — J449 Chronic obstructive pulmonary disease, unspecified: Secondary | ICD-10-CM | POA: Diagnosis not present

## 2019-03-25 DIAGNOSIS — C3411 Malignant neoplasm of upper lobe, right bronchus or lung: Secondary | ICD-10-CM | POA: Insufficient documentation

## 2019-03-25 DIAGNOSIS — Z5112 Encounter for antineoplastic immunotherapy: Secondary | ICD-10-CM | POA: Diagnosis not present

## 2019-03-25 DIAGNOSIS — M1990 Unspecified osteoarthritis, unspecified site: Secondary | ICD-10-CM

## 2019-03-25 DIAGNOSIS — Z5111 Encounter for antineoplastic chemotherapy: Secondary | ICD-10-CM | POA: Insufficient documentation

## 2019-03-25 DIAGNOSIS — Z79899 Other long term (current) drug therapy: Secondary | ICD-10-CM | POA: Insufficient documentation

## 2019-03-25 DIAGNOSIS — Z5189 Encounter for other specified aftercare: Secondary | ICD-10-CM | POA: Insufficient documentation

## 2019-03-25 LAB — CMP (CANCER CENTER ONLY)
ALT: 8 U/L (ref 0–44)
AST: 12 U/L — ABNORMAL LOW (ref 15–41)
Albumin: 3.3 g/dL — ABNORMAL LOW (ref 3.5–5.0)
Alkaline Phosphatase: 120 U/L (ref 38–126)
Anion gap: 10 (ref 5–15)
BUN: 19 mg/dL (ref 8–23)
CO2: 25 mmol/L (ref 22–32)
Calcium: 9.2 mg/dL (ref 8.9–10.3)
Chloride: 105 mmol/L (ref 98–111)
Creatinine: 1.2 mg/dL — ABNORMAL HIGH (ref 0.44–1.00)
GFR, Est AFR Am: 55 mL/min — ABNORMAL LOW (ref >60)
GFR, Estimated: 47 mL/min — ABNORMAL LOW (ref >60)
Glucose, Bld: 89 mg/dL (ref 70–99)
Potassium: 3.4 mmol/L — ABNORMAL LOW (ref 3.5–5.1)
Sodium: 140 mmol/L (ref 135–145)
Total Bilirubin: <0.2 mg/dL — ABNORMAL LOW (ref 0.3–1.2)
Total Protein: 6.6 g/dL (ref 6.5–8.1)

## 2019-03-25 LAB — CBC WITH DIFFERENTIAL (CANCER CENTER ONLY)
Abs Immature Granulocytes: 0.94 10*3/uL — ABNORMAL HIGH (ref 0.00–0.07)
Basophils Absolute: 0.1 10*3/uL (ref 0.0–0.1)
Basophils Relative: 0 %
Eosinophils Absolute: 0.1 10*3/uL (ref 0.0–0.5)
Eosinophils Relative: 1 %
HCT: 36.6 % (ref 36.0–46.0)
Hemoglobin: 12.1 g/dL (ref 12.0–15.0)
Immature Granulocytes: 8 %
Lymphocytes Relative: 10 %
Lymphs Abs: 1.2 10*3/uL (ref 0.7–4.0)
MCH: 29.4 pg (ref 26.0–34.0)
MCHC: 33.1 g/dL (ref 30.0–36.0)
MCV: 88.8 fL (ref 80.0–100.0)
Monocytes Absolute: 0.9 10*3/uL (ref 0.1–1.0)
Monocytes Relative: 8 %
Neutro Abs: 8.5 10*3/uL — ABNORMAL HIGH (ref 1.7–7.7)
Neutrophils Relative %: 73 %
Platelet Count: 229 10*3/uL (ref 150–400)
RBC: 4.12 MIL/uL (ref 3.87–5.11)
RDW: 12.7 % (ref 11.5–15.5)
WBC Count: 11.6 10*3/uL — ABNORMAL HIGH (ref 4.0–10.5)
nRBC: 0.2 % (ref 0.0–0.2)

## 2019-03-25 MED ORDER — SODIUM CHLORIDE 0.9 % IV SOLN
Freq: Once | INTRAVENOUS | Status: AC
  Administered 2019-03-25: 13:00:00 via INTRAVENOUS
  Filled 2019-03-25: qty 250

## 2019-03-25 MED ORDER — SODIUM CHLORIDE 0.9 % IV SOLN
92.0000 mg/m2 | Freq: Once | INTRAVENOUS | Status: AC
  Administered 2019-03-25: 200 mg via INTRAVENOUS
  Filled 2019-03-25: qty 10

## 2019-03-25 MED ORDER — PALONOSETRON HCL INJECTION 0.25 MG/5ML
0.2500 mg | Freq: Once | INTRAVENOUS | Status: AC
  Administered 2019-03-25: 0.25 mg via INTRAVENOUS

## 2019-03-25 MED ORDER — PALONOSETRON HCL INJECTION 0.25 MG/5ML
INTRAVENOUS | Status: AC
  Filled 2019-03-25: qty 5

## 2019-03-25 MED ORDER — SODIUM CHLORIDE 0.9 % IV SOLN
523.5000 mg | Freq: Once | INTRAVENOUS | Status: AC
  Administered 2019-03-25: 520 mg via INTRAVENOUS
  Filled 2019-03-25: qty 52

## 2019-03-25 MED ORDER — SODIUM CHLORIDE 0.9 % IV SOLN
1200.0000 mg | Freq: Once | INTRAVENOUS | Status: AC
  Administered 2019-03-25: 1200 mg via INTRAVENOUS
  Filled 2019-03-25: qty 20

## 2019-03-25 MED ORDER — SODIUM CHLORIDE 0.9 % IV SOLN
Freq: Once | INTRAVENOUS | Status: AC
  Administered 2019-03-25: 13:00:00 via INTRAVENOUS
  Filled 2019-03-25: qty 5

## 2019-03-25 NOTE — Patient Instructions (Signed)
[facility] Discharge Instructions for Patients Receiving Chemotherapy   Today you received the following chemotherapy agents Atezolizumab (TECENTRIQ), Carboplatin (PARAPLATIN) & Etoposide (VEPESID).  To help prevent nausea and vomiting after your treatment, we encourage you to take your nausea medication as prescribed.   If you develop nausea and vomiting that is not controlled by your nausea medication, call the clinic.   BELOW ARE SYMPTOMS THAT SHOULD BE REPORTED IMMEDIATELY:  *FEVER GREATER THAN 100.5 F  *CHILLS WITH OR WITHOUT FEVER  NAUSEA AND VOMITING THAT IS NOT CONTROLLED WITH YOUR NAUSEA MEDICATION  *UNUSUAL SHORTNESS OF BREATH  *UNUSUAL BRUISING OR BLEEDING  TENDERNESS IN MOUTH AND THROAT WITH OR WITHOUT PRESENCE OF ULCERS  *URINARY PROBLEMS  *BOWEL PROBLEMS  UNUSUAL RASH Items with * indicate a potential emergency and should be followed up as soon as possible.  Feel free to call the clinic should you have any questions or concerns. The clinic phone number is [phone].  Please show the [place] at check-in to the Emergency Department and triage nurse.    Coronavirus (COVID-19) Are you at risk?  Are you at risk for the Coronavirus (COVID-19)?  To be considered HIGH RISK for Coronavirus (COVID-19), you have to meet the following criteria:  . Traveled to [place], [place], [place], [place] or [place]; or in the [place] to [place], [place], [place], or [place]; and have fever, cough, and shortness of breath within the last 2 weeks of travel OR . Been in close contact with a person diagnosed with COVID-19 within the last 2 weeks and have fever, cough, and shortness of breath . IF YOU DO NOT MEET THESE CRITERIA, YOU ARE CONSIDERED LOW RISK FOR COVID-19.  What to do if you are HIGH RISK for COVID-19?  Marland Kitchen If you are having a medical emergency, call 911. . Seek medical care right away. Before you go to a doctor's office, urgent  care or emergency department, call ahead and tell them about your recent travel, contact with someone diagnosed with COVID-19, and your symptoms. You should receive instructions from your physician's office regarding next steps of care.  . When you arrive at healthcare provider, tell the healthcare staff immediately you have returned from visiting [place], [place], [place], [place] or [place]; or traveled in the [place] to [place], [place], [place], or [place]; in the last two weeks or you have been in close contact with a person diagnosed with COVID-19 in the last 2 weeks.   . Tell the health care staff about your symptoms: fever, cough and shortness of breath. . After you have been seen by a medical provider, you will be either: o Tested for (COVID-19) and discharged home on quarantine except to seek medical care if symptoms worsen, and asked to  - Stay home and avoid contact with others until you get your results (4-5 days)  - Avoid travel on public transportation if possible (such as bus, train, or airplane) or o Sent to the Emergency Department by EMS for evaluation, COVID-19 testing, and possible admission depending on your condition and test results.  What to do if you are LOW RISK for COVID-19?  Reduce your risk of any infection by using the same precautions used for avoiding the common cold or flu:  Marland Kitchen Wash your hands often with soap and warm water for at least 20 seconds.  If soap and water are not readily available, use an alcohol-based hand sanitizer with at  least 60% alcohol.  . If coughing or sneezing, cover your mouth and nose by coughing or sneezing into the elbow areas of your shirt or coat, into a tissue or into your sleeve (not your hands). . Avoid shaking hands with others and consider head nods or verbal greetings only. . Avoid touching your eyes, nose, or mouth with unwashed hands.  . Avoid close contact with people who are sick. . Avoid places or events with large  numbers of people in one location, like concerts or sporting events. . Carefully consider travel plans you have or are making. . If you are planning any travel outside or inside the [place], visit the CDC's Travelers' Health webpage for the latest health notices. . If you have some symptoms but not all symptoms, continue to monitor at home and seek medical attention if your symptoms worsen. . If you are having a medical emergency, call 911.   [place] / e-Visit: [email or web address]         [facility]: [facility]: [phone]                   [facility]: [phone]

## 2019-03-25 NOTE — Progress Notes (Signed)
[name]:([phone]) [phone]   Fax:(336) [place], [place] [address] [place] [address]  DIAGNOSIS: Recurrent small cell lung cancer initially diagnosed as Limited stage (T2b, N2, M0) small cell lung cancer diagnosed in November 2017 and presented with large right hilar mass with mediastinal invasion and a right lower lobe pulmonary nodule.  The patient had disease recurrence in the right lower lobe and mediastinal lymph nodes in February 2020.  PRIOR THERAPY:   1) Systemic chemotherapy with cisplatin 60 MG/M2 on day 1 and etoposide 120 MG/M2 on days 1, 2 and 3 concurrent with radiation status post 6 cycles.  Last dose was giving March 06, 2017. 2) status post prophylactic cranial irradiation.  CURRENT THERAPY: Systemic chemotherapy with carboplatin for AUC of 5 on day 1, etoposide.  100 mg/M2 on days 1, 2 and 3 as well as Tecentriq 1200 mg IV with Neulasta support every 3 weeks.  First dose March 05, 2019.  Status post 1 cycle.  INTERVAL HISTORY: Tammy Bowers 68 y.o. female returns to the clinic today for follow-up visit.  The patient is feeling fine today with no concerning complaints.  She tolerated the first cycle of her treatment with carboplatin, etoposide and Tecentriq fairly well.  She denied having any nausea, vomiting, diarrhea or constipation.  She denied having any headache or visual changes.  She has no weight loss or night sweats.  The patient was supposed to have a Port-A-Cath placed recently but she was very anxious and declined the procedure.  She is here today for evaluation before starting cycle #2.  MEDICAL HISTORY: Past Medical History:  Diagnosis Date  . Adenopathy 10/10/2016   PERICARINAL  . Anemia   . Arthritis   . Asthma   . Cancer ([facility])    lung cancer Small Cell  . COPD (chronic obstructive pulmonary disease) ([facility])   . Dyspnea   . Headache    migraines  . Hilar mass  10/10/2016   RIGHT  . History of kidney stones   . Insomnia   . Lung nodules 10/10/2016   RIGHT LOWER LOBE  . Mass of both adrenal glands ([place]) 10/10/2016  . Mood swing   . Pneumonia   . Spinal headache   . Substance abuse ([place])    TOBACCO  . UNSPECIFIED INFECTION OF BONE ANKLE AND FOOT 10/22/2009   Annotation: left ankle Qualifier: Diagnosis of  By: Patsy Baltimore RN, Denise      ALLERGIES:  is allergic to lidocaine.  MEDICATIONS:  Current Outpatient Medications  Medication Sig Dispense Refill  . albuterol (PROVENTIL HFA;VENTOLIN HFA) 108 (90 BASE) MCG/ACT inhaler Inhale 2 puffs into the lungs every 4 (four) hours as needed for wheezing.    . Cyclobenzaprine HCl (FLEXERIL PO) Take 5 mg by mouth 3 (three) times daily as needed (muscle spasms).     . Fluticasone-Salmeterol (ADVAIR) 500-50 MCG/DOSE AEPB Inhale 1 puff into the lungs 2 (two) times daily.    Marland Kitchen LORazepam (ATIVAN) 0.5 MG tablet Take 1 tablet (0.5 mg total) by mouth at bedtime. (Patient taking differently: Take 0.5 mg by mouth at bedtime as needed for anxiety. ) 20 tablet 0  . Multiple Vitamin (MULTIVITAMIN WITH MINERALS) TABS tablet Take 1 tablet by mouth daily.     . prochlorperazine (COMPAZINE) 10 MG tablet TAKE 1 TABLET(10 MG) BY MOUTH EVERY 6 HOURS AS NEEDED FOR NAUSEA OR VOMITING 30 tablet 0  . sertraline (  ZOLOFT) 50 MG tablet Take 50 mg by mouth at bedtime.  5   No current facility-administered medications for this visit.     SURGICAL HISTORY:  Past Surgical History:  Procedure Laterality Date  . ANKLE SURGERY     hardware removal, and initial ankle surgery  . APPENDECTOMY    . LUNG BIOPSY N/A 10/19/2016   Procedure: LUNG BIOPSY;  Surgeon: Grace Isaac, MD;  Location: [facility];  Service: Thoracic;  Laterality: N/A;  . LUNG BIOPSY N/A 02/20/2019   Procedure: LUNG BIOPSY;  Surgeon: Grace Isaac, MD;  Location: [facility];  Service: Thoracic;  Laterality: N/A;  . TONSILLECTOMY    . VIDEO BRONCHOSCOPY WITH  ENDOBRONCHIAL ULTRASOUND N/A 10/19/2016   Procedure: VIDEO BRONCHOSCOPY WITH ENDOBRONCHIAL ULTRASOUND;  Surgeon: Grace Isaac, MD;  Location: [facility];  Service: Thoracic;  Laterality: N/A;  . VIDEO BRONCHOSCOPY WITH ENDOBRONCHIAL ULTRASOUND N/A 02/20/2019   Procedure: VIDEO BRONCHOSCOPY WITH ENDOBRONCHIAL ULTRASOUND;  Surgeon: Grace Isaac, MD;  Location: [facility];  Service: Thoracic;  Laterality: N/A;    REVIEW OF SYSTEMS:  A comprehensive review of systems was negative except for: Constitutional: positive for fatigue   PHYSICAL EXAMINATION: General appearance: alert, cooperative, fatigued and no distress Head: Normocephalic, without obvious abnormality, atraumatic Neck: no adenopathy, no JVD, supple, symmetrical, trachea midline and thyroid not enlarged, symmetric, no tenderness/mass/nodules Lymph nodes: Cervical, supraclavicular, and axillary nodes normal. Resp: clear to auscultation bilaterally Back: symmetric, no curvature. ROM normal. No CVA tenderness. Cardio: regular rate and rhythm, S1, S2 normal, no murmur, click, rub or gallop GI: soft, non-tender; bowel sounds normal; no masses,  no organomegaly Extremities: extremities normal, atraumatic, no cyanosis or edema  ECOG PERFORMANCE STATUS: 1 - Symptomatic but completely ambulatory  Blood pressure 120/80, pulse (!) 117, resp. rate 18, height 5\' 6"  (1.676 m), weight 220 lb 6.4 oz (100 kg), SpO2 96 %.  LABORATORY DATA: Lab Results  Component Value Date   WBC 11.6 (H) 03/25/2019   HGB 12.1 03/25/2019   HCT 36.6 03/25/2019   MCV 88.8 03/25/2019   PLT 229 03/25/2019      Chemistry      Component Value Date/Time   NA 138 03/11/2019 0859   NA 139 11/24/2017 0913   K 3.4 (L) 03/11/2019 0859   K 3.8 11/24/2017 0913   CL 103 03/11/2019 0859   CO2 22 03/11/2019 0859   CO2 24 11/24/2017 0913   BUN 26 (H) 03/11/2019 0859   BUN 22.1 11/24/2017 0913   CREATININE 1.10 (H) 03/11/2019 0859   CREATININE 1.2 (H) 11/24/2017 0913       Component Value Date/Time   CALCIUM 8.5 (L) 03/11/2019 0859   CALCIUM 9.7 11/24/2017 0913   ALKPHOS 96 03/11/2019 0859   ALKPHOS 99 11/24/2017 0913   AST 13 (L) 03/11/2019 0859   AST 15 11/24/2017 0913   ALT 10 03/11/2019 0859   ALT 7 11/24/2017 0913   BILITOT 0.4 03/11/2019 0859   BILITOT 0.37 11/24/2017 0913       RADIOGRAPHIC STUDIES: No results found.  ASSESSMENT AND PLAN:  This is a very pleasant 68 years old white female with recurrent small cell lung cancer that was initially diagnosed as limited stage disease status post a course of systemic chemotherapy with cisplatin and etoposide for 6 cycles concurrent with radiation. She tolerated her treatment well except for fatigue. She is also status post prophylactic cranial irradiation. The patient has been in observation since March 2018. She was found  on recent imaging studies to have evidence for disease recurrence with right lower lobe pulmonary nodules in addition to mediastinal lymphadenopathy. Repeat bronchoscopy with endobronchial ultrasound and biopsy confirmed recurrence of his small cell lung cancer. The patient was started on systemic chemotherapy with carboplatin, etoposide and Tecentriq status post 1 cycle.  She tolerated the first cycle of this treatment well with no concerning adverse effects. I recommended for the patient to proceed with cycle #2 today as scheduled. I will see her back for follow-up visit in 3 weeks for evaluation before the next cycle of her treatment. The patient was advised to call immediately if she has any concerning symptoms in the interval. She was advised to quit smoking and offered her a smoke cessation program. The patient was also advised to call immediately if she has any concerning symptoms in the interval. The patient voices understanding of current disease status and treatment options and is in agreement with the current care plan.  All questions were answered. The patient knows to  call the clinic with any problems, questions or concerns. We can certainly see the patient much sooner if necessary.  Disclaimer: This note was dictated with voice recognition software. Similar sounding words can inadvertently be transcribed and may not be corrected upon review.

## 2019-03-25 NOTE — Progress Notes (Signed)
Per Dr  Julien Nordmann it is okay to leave pt IV in tonight for treatment tomorrow.

## 2019-03-26 ENCOUNTER — Telehealth: Payer: Self-pay | Admitting: Internal Medicine

## 2019-03-26 ENCOUNTER — Inpatient Hospital Stay: Payer: Medicare Other

## 2019-03-26 ENCOUNTER — Other Ambulatory Visit: Payer: Self-pay

## 2019-03-26 VITALS — BP 130/82 | HR 99 | Temp 98.6°F | Resp 16

## 2019-03-26 DIAGNOSIS — Z5189 Encounter for other specified aftercare: Secondary | ICD-10-CM | POA: Diagnosis not present

## 2019-03-26 DIAGNOSIS — C3411 Malignant neoplasm of upper lobe, right bronchus or lung: Secondary | ICD-10-CM | POA: Diagnosis not present

## 2019-03-26 DIAGNOSIS — Z5112 Encounter for antineoplastic immunotherapy: Secondary | ICD-10-CM | POA: Diagnosis not present

## 2019-03-26 DIAGNOSIS — Z5111 Encounter for antineoplastic chemotherapy: Secondary | ICD-10-CM | POA: Diagnosis not present

## 2019-03-26 DIAGNOSIS — C3491 Malignant neoplasm of unspecified part of right bronchus or lung: Secondary | ICD-10-CM

## 2019-03-26 DIAGNOSIS — G4700 Insomnia, unspecified: Secondary | ICD-10-CM | POA: Diagnosis not present

## 2019-03-26 DIAGNOSIS — J449 Chronic obstructive pulmonary disease, unspecified: Secondary | ICD-10-CM | POA: Diagnosis not present

## 2019-03-26 MED ORDER — SODIUM CHLORIDE 0.9 % IV SOLN
200.0000 mg | Freq: Once | INTRAVENOUS | Status: AC
  Administered 2019-03-26: 15:00:00 200 mg via INTRAVENOUS
  Filled 2019-03-26: qty 10

## 2019-03-26 MED ORDER — SODIUM CHLORIDE 0.9 % IV SOLN
Freq: Once | INTRAVENOUS | Status: AC
  Administered 2019-03-26: 15:00:00 via INTRAVENOUS
  Filled 2019-03-26: qty 250

## 2019-03-26 MED ORDER — DEXAMETHASONE SODIUM PHOSPHATE 10 MG/ML IJ SOLN
INTRAMUSCULAR | Status: AC
  Filled 2019-03-26: qty 1

## 2019-03-26 MED ORDER — DEXAMETHASONE SODIUM PHOSPHATE 10 MG/ML IJ SOLN
10.0000 mg | Freq: Once | INTRAMUSCULAR | Status: AC
  Administered 2019-03-26: 10 mg via INTRAVENOUS

## 2019-03-26 NOTE — Telephone Encounter (Signed)
Added port flush on 4/28 per sch msg.

## 2019-03-26 NOTE — Patient Instructions (Signed)
[facility] Discharge Instructions for Patients Receiving Chemotherapy  Today you received the following chemotherapy agents:  Etoposide.  To help prevent nausea and vomiting after your treatment, we encourage you to take your nausea medication as directed.   If you develop nausea and vomiting that is not controlled by your nausea medication, call the clinic.   BELOW ARE SYMPTOMS THAT SHOULD BE REPORTED IMMEDIATELY:  *FEVER GREATER THAN 100.5 F  *CHILLS WITH OR WITHOUT FEVER  NAUSEA AND VOMITING THAT IS NOT CONTROLLED WITH YOUR NAUSEA MEDICATION  *UNUSUAL SHORTNESS OF BREATH  *UNUSUAL BRUISING OR BLEEDING  TENDERNESS IN MOUTH AND THROAT WITH OR WITHOUT PRESENCE OF ULCERS  *URINARY PROBLEMS  *BOWEL PROBLEMS  UNUSUAL RASH Items with * indicate a potential emergency and should be followed up as soon as possible.  Feel free to call the clinic you have any questions or concerns. The clinic phone number is [phone].  Please show the [place] at check-in to the Emergency Department and triage nurse.  Coronavirus (COVID-19) Are you at risk?  Are you at risk for the Coronavirus (COVID-19)?  To be considered HIGH RISK for Coronavirus (COVID-19), you have to meet the following criteria:  . Traveled to [place], [place], [place], [place] or [place]; or in the [place] to [place], [place], [place], or [place]; and have fever, cough, and shortness of breath within the last 2 weeks of travel OR . Been in close contact with a person diagnosed with COVID-19 within the last 2 weeks and have fever, cough, and shortness of breath . IF YOU DO NOT MEET THESE CRITERIA, YOU ARE CONSIDERED LOW RISK FOR COVID-19.  What to do if you are HIGH RISK for COVID-19?  Marland Kitchen If you are having a medical emergency, call 911. . Seek medical care right away. Before you go to a doctor's office, urgent care or emergency department, call ahead and tell them about your recent  travel, contact with someone diagnosed with COVID-19, and your symptoms. You should receive instructions from your physician's office regarding next steps of care.  . When you arrive at healthcare provider, tell the healthcare staff immediately you have returned from visiting [place], [place], [place], [place] or [place]; or traveled in the [place] to [place], [place], [place], or [place]; in the last two weeks or you have been in close contact with a person diagnosed with COVID-19 in the last 2 weeks.   . Tell the health care staff about your symptoms: fever, cough and shortness of breath. . After you have been seen by a medical provider, you will be either: o Tested for (COVID-19) and discharged home on quarantine except to seek medical care if symptoms worsen, and asked to  - Stay home and avoid contact with others until you get your results (4-5 days)  - Avoid travel on public transportation if possible (such as bus, train, or airplane) or o Sent to the Emergency Department by EMS for evaluation, COVID-19 testing, and possible admission depending on your condition and test results.  What to do if you are LOW RISK for COVID-19?  Reduce your risk of any infection by using the same precautions used for avoiding the common cold or flu:  Marland Kitchen Wash your hands often with soap and warm water for at least 20 seconds.  If soap and water are not readily available, use an alcohol-based hand sanitizer with at least 60% alcohol.  . If coughing or sneezing,  cover your mouth and nose by coughing or sneezing into the elbow areas of your shirt or coat, into a tissue or into your sleeve (not your hands). . Avoid shaking hands with others and consider head nods or verbal greetings only. . Avoid touching your eyes, nose, or mouth with unwashed hands.  . Avoid close contact with people who are sick. . Avoid places or events with large numbers of people in one location, like concerts or sporting  events. . Carefully consider travel plans you have or are making. . If you are planning any travel outside or inside the [place], visit the CDC's Travelers' Health webpage for the latest health notices. . If you have some symptoms but not all symptoms, continue to monitor at home and seek medical attention if your symptoms worsen. . If you are having a medical emergency, call 911.   [place] / e-Visit: [email or web address]         [facility]: [facility]: [phone]                   [facility]: [phone]

## 2019-03-27 ENCOUNTER — Inpatient Hospital Stay: Payer: Medicare Other

## 2019-03-27 ENCOUNTER — Other Ambulatory Visit: Payer: Self-pay | Admitting: Radiology

## 2019-03-27 ENCOUNTER — Other Ambulatory Visit: Payer: Self-pay

## 2019-03-27 VITALS — BP 132/82 | HR 96 | Temp 98.9°F | Resp 18

## 2019-03-27 DIAGNOSIS — C3491 Malignant neoplasm of unspecified part of right bronchus or lung: Secondary | ICD-10-CM

## 2019-03-27 DIAGNOSIS — G4700 Insomnia, unspecified: Secondary | ICD-10-CM | POA: Diagnosis not present

## 2019-03-27 DIAGNOSIS — Z5189 Encounter for other specified aftercare: Secondary | ICD-10-CM | POA: Diagnosis not present

## 2019-03-27 DIAGNOSIS — Z5112 Encounter for antineoplastic immunotherapy: Secondary | ICD-10-CM | POA: Diagnosis not present

## 2019-03-27 DIAGNOSIS — C3411 Malignant neoplasm of upper lobe, right bronchus or lung: Secondary | ICD-10-CM | POA: Diagnosis not present

## 2019-03-27 DIAGNOSIS — Z5111 Encounter for antineoplastic chemotherapy: Secondary | ICD-10-CM | POA: Diagnosis not present

## 2019-03-27 DIAGNOSIS — J449 Chronic obstructive pulmonary disease, unspecified: Secondary | ICD-10-CM | POA: Diagnosis not present

## 2019-03-27 MED ORDER — SODIUM CHLORIDE 0.9 % IV SOLN
92.0000 mg/m2 | Freq: Once | INTRAVENOUS | Status: AC
  Administered 2019-03-27: 200 mg via INTRAVENOUS
  Filled 2019-03-27: qty 10

## 2019-03-27 MED ORDER — DEXAMETHASONE SODIUM PHOSPHATE 10 MG/ML IJ SOLN
INTRAMUSCULAR | Status: AC
  Filled 2019-03-27: qty 1

## 2019-03-27 MED ORDER — DEXAMETHASONE SODIUM PHOSPHATE 10 MG/ML IJ SOLN
10.0000 mg | Freq: Once | INTRAMUSCULAR | Status: AC
  Administered 2019-03-27: 10 mg via INTRAVENOUS

## 2019-03-27 MED ORDER — SODIUM CHLORIDE 0.9 % IV SOLN
Freq: Once | INTRAVENOUS | Status: AC
  Administered 2019-03-27: 14:00:00 via INTRAVENOUS
  Filled 2019-03-27: qty 250

## 2019-03-27 NOTE — Patient Instructions (Signed)
[facility] Discharge Instructions for Patients Receiving Chemotherapy  Today you received the following chemotherapy agents:  Etoposide.  To help prevent nausea and vomiting after your treatment, we encourage you to take your nausea medication as directed.   If you develop nausea and vomiting that is not controlled by your nausea medication, call the clinic.   BELOW ARE SYMPTOMS THAT SHOULD BE REPORTED IMMEDIATELY:  *FEVER GREATER THAN 100.5 F  *CHILLS WITH OR WITHOUT FEVER  NAUSEA AND VOMITING THAT IS NOT CONTROLLED WITH YOUR NAUSEA MEDICATION  *UNUSUAL SHORTNESS OF BREATH  *UNUSUAL BRUISING OR BLEEDING  TENDERNESS IN MOUTH AND THROAT WITH OR WITHOUT PRESENCE OF ULCERS  *URINARY PROBLEMS  *BOWEL PROBLEMS  UNUSUAL RASH Items with * indicate a potential emergency and should be followed up as soon as possible.  Feel free to call the clinic you have any questions or concerns. The clinic phone number is [phone].  Please show the [place] at check-in to the Emergency Department and triage nurse.  Coronavirus (COVID-19) Are you at risk?  Are you at risk for the Coronavirus (COVID-19)?  To be considered HIGH RISK for Coronavirus (COVID-19), you have to meet the following criteria:  . Traveled to [place], [place], [place], [place] or [place]; or in the [place] to [place], [place], [place], or [place]; and have fever, cough, and shortness of breath within the last 2 weeks of travel OR . Been in close contact with a person diagnosed with COVID-19 within the last 2 weeks and have fever, cough, and shortness of breath . IF YOU DO NOT MEET THESE CRITERIA, YOU ARE CONSIDERED LOW RISK FOR COVID-19.  What to do if you are HIGH RISK for COVID-19?  Marland Kitchen If you are having a medical emergency, call 911. . Seek medical care right away. Before you go to a doctor's office, urgent care or emergency department, call ahead and tell them about your recent  travel, contact with someone diagnosed with COVID-19, and your symptoms. You should receive instructions from your physician's office regarding next steps of care.  . When you arrive at healthcare provider, tell the healthcare staff immediately you have returned from visiting [place], [place], [place], [place] or [place]; or traveled in the [place] to [place], [place], [place], or [place]; in the last two weeks or you have been in close contact with a person diagnosed with COVID-19 in the last 2 weeks.   . Tell the health care staff about your symptoms: fever, cough and shortness of breath. . After you have been seen by a medical provider, you will be either: o Tested for (COVID-19) and discharged home on quarantine except to seek medical care if symptoms worsen, and asked to  - Stay home and avoid contact with others until you get your results (4-5 days)  - Avoid travel on public transportation if possible (such as bus, train, or airplane) or o Sent to the Emergency Department by EMS for evaluation, COVID-19 testing, and possible admission depending on your condition and test results.  What to do if you are LOW RISK for COVID-19?  Reduce your risk of any infection by using the same precautions used for avoiding the common cold or flu:  Marland Kitchen Wash your hands often with soap and warm water for at least 20 seconds.  If soap and water are not readily available, use an alcohol-based hand sanitizer with at least 60% alcohol.  . If coughing or sneezing,  cover your mouth and nose by coughing or sneezing into the elbow areas of your shirt or coat, into a tissue or into your sleeve (not your hands). . Avoid shaking hands with others and consider head nods or verbal greetings only. . Avoid touching your eyes, nose, or mouth with unwashed hands.  . Avoid close contact with people who are sick. . Avoid places or events with large numbers of people in one location, like concerts or sporting  events. . Carefully consider travel plans you have or are making. . If you are planning any travel outside or inside the [place], visit the CDC's Travelers' Health webpage for the latest health notices. . If you have some symptoms but not all symptoms, continue to monitor at home and seek medical attention if your symptoms worsen. . If you are having a medical emergency, call 911.   [place] / e-Visit: [email or web address]         [facility]: [facility]: [phone]                   [facility]: [phone]

## 2019-03-28 ENCOUNTER — Telehealth: Payer: Self-pay | Admitting: *Deleted

## 2019-03-28 NOTE — Telephone Encounter (Signed)
Received vm message from pt's husband. He states she started having diarrhea this morning x 3, abdominal cramping and heartburn type symptoms. Husband started Imodium with her and has had 2  doses so far today. Denies fever, nausea, vomiting. Instructed to increase fluid, intake-suggesting gatorade/pedialyte to help replace electrolytes. Instructed to give her antacids to help with heartburn. She had a V8 juice this am and after that her heartburn flaired up. Advised to eat bland, non-acidic food. Husband voiced understanding.  Also advised that I would call back this afternoon to check on her. Husband appreciated call back

## 2019-03-29 ENCOUNTER — Other Ambulatory Visit: Payer: Self-pay

## 2019-03-29 ENCOUNTER — Telehealth: Payer: Self-pay | Admitting: *Deleted

## 2019-03-29 ENCOUNTER — Inpatient Hospital Stay: Payer: Medicare Other

## 2019-03-29 ENCOUNTER — Ambulatory Visit (HOSPITAL_COMMUNITY): Payer: Medicare Other | Attending: Internal Medicine

## 2019-03-29 ENCOUNTER — Ambulatory Visit (HOSPITAL_COMMUNITY): Admission: RE | Admit: 2019-03-29 | Payer: Medicare Other | Source: Ambulatory Visit

## 2019-03-29 VITALS — BP 130/80

## 2019-03-29 MED ORDER — PEGFILGRASTIM-CBQV 6 MG/0.6ML ~~LOC~~ SOSY: 6.0000 mg | PREFILLED_SYRINGE | Freq: Once | SUBCUTANEOUS | Status: DC

## 2019-03-29 MED ORDER — PEGFILGRASTIM-CBQV 6 MG/0.6ML ~~LOC~~ SOSY
PREFILLED_SYRINGE | SUBCUTANEOUS | Status: AC
  Filled 2019-03-29: qty 0.6

## 2019-03-29 NOTE — Patient Instructions (Signed)
Pegfilgrastim injection  What is this medicine?  PEGFILGRASTIM (PEG fil gra stim) is a long-acting granulocyte colony-stimulating factor that stimulates the growth of neutrophils, a type of white blood cell important in the body's fight against infection. It is used to reduce the incidence of fever and infection in patients with certain types of cancer who are receiving chemotherapy that affects the bone marrow, and to increase survival after being exposed to high doses of radiation.  This medicine may be used for other purposes; ask your health care provider or pharmacist if you have questions.  COMMON BRAND NAME(S): Fulphila, Neulasta, UDENYCA  What should I tell my health care provider before I take this medicine?  They need to know if you have any of these conditions:  -kidney disease  -latex allergy  -ongoing radiation therapy  -sickle cell disease  -skin reactions to acrylic adhesives (On-Body Injector only)  -an unusual or allergic reaction to pegfilgrastim, filgrastim, other medicines, foods, dyes, or preservatives  -pregnant or trying to get pregnant  -breast-feeding  How should I use this medicine?  This medicine is for injection under the skin. If you get this medicine at home, you will be taught how to prepare and give the pre-filled syringe or how to use the On-body Injector. Refer to the patient Instructions for Use for detailed instructions. Use exactly as directed. Tell your healthcare provider immediately if you suspect that the On-body Injector may not have performed as intended or if you suspect the use of the On-body Injector resulted in a missed or partial dose.  It is important that you put your used needles and syringes in a special sharps container. Do not put them in a trash can. If you do not have a sharps container, call your pharmacist or healthcare provider to get one.  Talk to your pediatrician regarding the use of this medicine in children. While this drug may be prescribed for  selected conditions, precautions do apply.  Overdosage: If you think you have taken too much of this medicine contact a poison control center or emergency room at once.  NOTE: This medicine is only for you. Do not share this medicine with others.  What if I miss a dose?  It is important not to miss your dose. Call your doctor or health care professional if you miss your dose. If you miss a dose due to an On-body Injector failure or leakage, a new dose should be administered as soon as possible using a single prefilled syringe for manual use.  What may interact with this medicine?  Interactions have not been studied.  Give your health care provider a list of all the medicines, herbs, non-prescription drugs, or dietary supplements you use. Also tell them if you smoke, drink alcohol, or use illegal drugs. Some items may interact with your medicine.  This list may not describe all possible interactions. Give your health care provider a list of all the medicines, herbs, non-prescription drugs, or dietary supplements you use. Also tell them if you smoke, drink alcohol, or use illegal drugs. Some items may interact with your medicine.  What should I watch for while using this medicine?  You may need blood work done while you are taking this medicine.  If you are going to need a MRI, CT scan, or other procedure, tell your doctor that you are using this medicine (On-Body Injector only).  What side effects may I notice from receiving this medicine?  Side effects that you should report to   your doctor or health care professional as soon as possible:  -allergic reactions like skin rash, itching or hives, swelling of the face, lips, or tongue  -back pain  -dizziness  -fever  -pain, redness, or irritation at site where injected  -pinpoint red spots on the skin  -red or dark-brown urine  -shortness of breath or breathing problems  -stomach or side pain, or pain at the shoulder  -swelling  -tiredness  -trouble passing urine or  change in the amount of urine  Side effects that usually do not require medical attention (report to your doctor or health care professional if they continue or are bothersome):  -bone pain  -muscle pain  This list may not describe all possible side effects. Call your doctor for medical advice about side effects. You may report side effects to FDA at 1-800-FDA-1088.  Where should I keep my medicine?  Keep out of the reach of children.  If you are using this medicine at home, you will be instructed on how to store it. Throw away any unused medicine after the expiration date on the label.  NOTE: This sheet is a summary. It may not cover all possible information. If you have questions about this medicine, talk to your doctor, pharmacist, or health care provider.   2019 Elsevier/Gold Standard (2018-03-12 16:57:08)

## 2019-03-29 NOTE — Progress Notes (Unsigned)
No injection given. called pt cell and husband stated that she couldn't make appointment due to diarrhea

## 2019-03-29 NOTE — Telephone Encounter (Signed)
Received call from female stating that pt is weak & having small amt of diarrhea every 15-20 min. & needs to cancel injection today.  Called & spoke with caller & he states that they thought they had diarrhea under control yest but woke up this am & has been going freq.  He gave her 2 imodium this am @ 30 min ago & instructed to repeat after each loose stool up to 8/day.  Will discuss with Fl[facility] & see if they can contact her after lunch & see how she is & maybe come later in day.

## 2019-03-29 NOTE — Telephone Encounter (Addendum)
Received call from Kevin/Radiology asking about pt that hasn't shown up yet.  Informed that pt's husband didn't say anything about port when we talked this am but would f/u.  Called pt & received vm.  Left message to call back.  Notified Lennette Bihari.  Hopefully pt is on her way there.  Lennette Bihari called back & states he reached pt & she is not coming for [place] today.  He suggested calling back when she is able to r/s.   Called & reached pt & husband & she is unable to control bowels although not a lot of diarrhea & wants to r/s injection for tomorrow & port closer to next treatment.  Message to scheduler to add injection to tomorrow but will leave port placement for Dr Julien Nordmann review.

## 2019-03-30 ENCOUNTER — Inpatient Hospital Stay: Payer: Medicare Other

## 2019-03-30 ENCOUNTER — Other Ambulatory Visit: Payer: Self-pay | Admitting: Oncology

## 2019-03-30 VITALS — BP 96/55 | HR 116 | Temp 98.1°F

## 2019-03-30 DIAGNOSIS — Z5111 Encounter for antineoplastic chemotherapy: Secondary | ICD-10-CM | POA: Diagnosis not present

## 2019-03-30 DIAGNOSIS — J449 Chronic obstructive pulmonary disease, unspecified: Secondary | ICD-10-CM | POA: Diagnosis not present

## 2019-03-30 DIAGNOSIS — G4700 Insomnia, unspecified: Secondary | ICD-10-CM | POA: Diagnosis not present

## 2019-03-30 DIAGNOSIS — Z5112 Encounter for antineoplastic immunotherapy: Secondary | ICD-10-CM | POA: Diagnosis not present

## 2019-03-30 DIAGNOSIS — C3491 Malignant neoplasm of unspecified part of right bronchus or lung: Secondary | ICD-10-CM

## 2019-03-30 DIAGNOSIS — C3411 Malignant neoplasm of upper lobe, right bronchus or lung: Secondary | ICD-10-CM | POA: Diagnosis not present

## 2019-03-30 DIAGNOSIS — Z5189 Encounter for other specified aftercare: Secondary | ICD-10-CM | POA: Diagnosis not present

## 2019-03-30 MED ORDER — PEGFILGRASTIM-CBQV 6 MG/0.6ML ~~LOC~~ SOSY
PREFILLED_SYRINGE | SUBCUTANEOUS | Status: AC
  Filled 2019-03-30: qty 0.6

## 2019-03-30 MED ORDER — PEGFILGRASTIM-CBQV 6 MG/0.6ML ~~LOC~~ SOSY
6.0000 mg | PREFILLED_SYRINGE | Freq: Once | SUBCUTANEOUS | Status: AC
  Administered 2019-03-30: 6 mg via SUBCUTANEOUS

## 2019-03-30 MED ORDER — DIPHENOXYLATE-ATROPINE 2.5-0.025 MG PO TABS: 1.0000 | ORAL_TABLET | Freq: Four times a day (QID) | ORAL | 0 refills | Status: DC | PRN

## 2019-03-30 NOTE — Progress Notes (Signed)
Patient to clinic for Udenyca injection stated she has had diarrhea for 2 days and each day taking up to 6 tabs of Immodium with little improvement.  States that each BM is consistency of pudding not water.  Able to drink fluids well.  Her BP 96/55 and pulse of 115 and afebrile, no dizziness.    Does not want IV fluid replacement.  We reviewed importance of hydration and a low residue diet.  Encouraged patient to call back if diarrhea does not improve.  Patient verbalized understanding.  Dr Alen Blew, on call physician made aware and calling in Lomotil for her today.  Called patient's husband and made him aware of prescription.

## 2019-04-02 ENCOUNTER — Inpatient Hospital Stay: Payer: Medicare Other

## 2019-04-03 ENCOUNTER — Telehealth: Payer: Self-pay | Admitting: *Deleted

## 2019-04-03 NOTE — Telephone Encounter (Signed)
Received vm message from pt's husband requesting the re-scheduling of pt's port placement for either 04/12/19 or 04/15/19 Port placement via IR will be done @ [facility] during Covid 19 crisis starting next week. VM message left for [facility] IR [phone]) to schedule this. Awaiting call back form Caryl Pina

## 2019-04-04 ENCOUNTER — Telehealth: Payer: Self-pay

## 2019-04-04 NOTE — Telephone Encounter (Signed)
Neulasta Onpro Patient Outreach Note  Patient was contacted on 04/04/2019 in regards to switching G-CSF therapy to Neulasta Onpro (pegfilgrastim). Patient was educated on the purpose of this proposed change in therapy due to COVID-19 pandemic. Patient was educated about Neulasta Onpro on-body injector and patient will be provided with an educational video while in infusion on their next scheduled date if changed to Neulasta Onpro.   [x]  Patient agrees to change in therapy. Begin process to change to Neulasta Onpro therapy.  []  Patient does not agree to change in therapy. No change to Neulasta Onpro at this time.    Thank You,  Egbert Garibaldi  04/04/2019 4:24 PM

## 2019-04-09 ENCOUNTER — Inpatient Hospital Stay: Payer: Medicare Other

## 2019-04-10 ENCOUNTER — Other Ambulatory Visit: Payer: Self-pay | Admitting: Radiology

## 2019-04-11 ENCOUNTER — Telehealth: Payer: Self-pay | Admitting: *Deleted

## 2019-04-11 ENCOUNTER — Other Ambulatory Visit: Payer: Self-pay | Admitting: Radiology

## 2019-04-11 NOTE — Telephone Encounter (Signed)
Neulasta Onpro Patient Outreach Note  Patient was contacted on 04/10/2019 in regards to switching G-CSF therapy to Neulasta Onpro (pegfilgrastim). Patient was educated on the purpose of this proposed change in therapy due to COVID-19 pandemic. Patient was educated about Neulasta Onpro on-body injector and patient will be provided with an educational video while in infusion on their next scheduled date if changed to Neulasta Onpro.   [x]  Patient agrees to change in therapy. Begin process to change to Neulasta Onpro therapy.  []  Patient does not agree to change in therapy. No change to Neulasta Onpro at this time.    Thank You,  Caprice Renshaw  04/11/2019 3:31 PM

## 2019-04-12 ENCOUNTER — Other Ambulatory Visit: Payer: Self-pay | Admitting: Internal Medicine

## 2019-04-12 ENCOUNTER — Ambulatory Visit (HOSPITAL_COMMUNITY)
Admission: RE | Admit: 2019-04-12 | Discharge: 2019-04-12 | Disposition: A | Discharge status: Home / Self Care | Payer: Medicare Other | Source: Ambulatory Visit | Attending: Internal Medicine | Admitting: Internal Medicine

## 2019-04-12 ENCOUNTER — Encounter (HOSPITAL_COMMUNITY): Payer: Self-pay | Admitting: Diagnostic Radiology

## 2019-04-12 ENCOUNTER — Other Ambulatory Visit: Payer: Self-pay

## 2019-04-12 DIAGNOSIS — F17210 Nicotine dependence, cigarettes, uncomplicated: Secondary | ICD-10-CM | POA: Diagnosis not present

## 2019-04-12 DIAGNOSIS — Z884 Allergy status to anesthetic agent status: Secondary | ICD-10-CM | POA: Diagnosis not present

## 2019-04-12 DIAGNOSIS — M1990 Unspecified osteoarthritis, unspecified site: Secondary | ICD-10-CM | POA: Diagnosis not present

## 2019-04-12 DIAGNOSIS — C3491 Malignant neoplasm of unspecified part of right bronchus or lung: Secondary | ICD-10-CM | POA: Diagnosis not present

## 2019-04-12 DIAGNOSIS — Z79899 Other long term (current) drug therapy: Secondary | ICD-10-CM | POA: Insufficient documentation

## 2019-04-12 DIAGNOSIS — C3431 Malignant neoplasm of lower lobe, right bronchus or lung: Secondary | ICD-10-CM | POA: Diagnosis not present

## 2019-04-12 DIAGNOSIS — Z8249 Family history of ischemic heart disease and other diseases of the circulatory system: Secondary | ICD-10-CM | POA: Diagnosis not present

## 2019-04-12 DIAGNOSIS — C3490 Malignant neoplasm of unspecified part of unspecified bronchus or lung: Secondary | ICD-10-CM | POA: Diagnosis not present

## 2019-04-12 DIAGNOSIS — C771 Secondary and unspecified malignant neoplasm of intrathoracic lymph nodes: Secondary | ICD-10-CM | POA: Diagnosis not present

## 2019-04-12 DIAGNOSIS — Z452 Encounter for adjustment and management of vascular access device: Secondary | ICD-10-CM | POA: Diagnosis not present

## 2019-04-12 DIAGNOSIS — G4700 Insomnia, unspecified: Secondary | ICD-10-CM | POA: Diagnosis not present

## 2019-04-12 HISTORY — PX: IR IMAGING GUIDED PORT INSERTION: IMG5740

## 2019-04-12 LAB — PROTIME-INR
INR: 0.9 (ref 0.8–1.2)
Prothrombin Time: 12.5 seconds (ref 11.4–15.2)

## 2019-04-12 LAB — CBC
HCT: 31.3 % — ABNORMAL LOW (ref 36.0–46.0)
Hemoglobin: 10.3 g/dL — ABNORMAL LOW (ref 12.0–15.0)
MCH: 30 pg (ref 26.0–34.0)
MCHC: 32.9 g/dL (ref 30.0–36.0)
MCV: 91.3 fL (ref 80.0–100.0)
Platelets: 146 10*3/uL — ABNORMAL LOW (ref 150–400)
RBC: 3.43 MIL/uL — ABNORMAL LOW (ref 3.87–5.11)
RDW: 13.3 % (ref 11.5–15.5)
WBC: 17.1 10*3/uL — ABNORMAL HIGH (ref 4.0–10.5)
nRBC: 0.2 % (ref 0.0–0.2)

## 2019-04-12 MED ORDER — HEPARIN SOD (PORK) LOCK FLUSH 100 UNIT/ML IV SOLN
INTRAVENOUS | Status: AC | PRN
  Administered 2019-04-12: 500 [IU] via INTRAVENOUS

## 2019-04-12 MED ORDER — CHLOROPROCAINE HCL 1 % IJ SOLN
INTRAMUSCULAR | Status: AC
  Filled 2019-04-12: qty 30

## 2019-04-12 MED ORDER — FENTANYL CITRATE (PF) 100 MCG/2ML IJ SOLN
INTRAMUSCULAR | Status: AC | PRN
  Administered 2019-04-12 (×2): 50 ug via INTRAVENOUS

## 2019-04-12 MED ORDER — HEPARIN SOD (PORK) LOCK FLUSH 100 UNIT/ML IV SOLN
INTRAVENOUS | Status: AC
  Filled 2019-04-12: qty 5

## 2019-04-12 MED ORDER — MIDAZOLAM HCL 2 MG/2ML IJ SOLN
INTRAMUSCULAR | Status: AC | PRN
  Administered 2019-04-12 (×4): 1 mg via INTRAVENOUS

## 2019-04-12 MED ORDER — MIDAZOLAM HCL 2 MG/2ML IJ SOLN
INTRAMUSCULAR | Status: AC
  Filled 2019-04-12: qty 2

## 2019-04-12 MED ORDER — SODIUM CHLORIDE 0.9 % IV SOLN: INTRAVENOUS | Status: DC

## 2019-04-12 MED ORDER — CHLOROPROCAINE HCL 1 % IJ SOLN
INTRAMUSCULAR | Status: AC | PRN
  Administered 2019-04-12: 15 mL
  Administered 2019-04-12: 10 mL

## 2019-04-12 MED ORDER — FENTANYL CITRATE (PF) 100 MCG/2ML IJ SOLN
INTRAMUSCULAR | Status: AC
  Filled 2019-04-12: qty 2

## 2019-04-12 MED ORDER — CEFAZOLIN SODIUM-DEXTROSE 2-4 GM/100ML-% IV SOLN
INTRAVENOUS | Status: AC
  Administered 2019-04-12: 12:00:00 2 g via INTRAVENOUS
  Filled 2019-04-12: qty 100

## 2019-04-12 MED ORDER — CEFAZOLIN SODIUM-DEXTROSE 2-4 GM/100ML-% IV SOLN
2.0000 g | Freq: Once | INTRAVENOUS | Status: AC
  Administered 2019-04-12: 12:00:00 2 g via INTRAVENOUS

## 2019-04-12 NOTE — Discharge Instructions (Signed)
Implanted Port Insertion, Care After °This sheet gives you information about how to care for yourself after your procedure. Your health care provider may also give you more specific instructions. If you have problems or questions, contact your health care provider. °What can I expect after the procedure? °After the procedure, it is common to have: °· Discomfort at the port insertion site. °· Bruising on the skin over the port. This should improve over 3-4 days. °Follow these instructions at home: °Port care °· After your port is placed, you will get a manufacturer's information card. The card has information about your port. Keep this card with you at all times. °· Take care of the port as told by your health care provider. Ask your health care provider if you or a family member can get training for taking care of the port at home. A home health care nurse may also take care of the port. °· Make sure to remember what type of port you have. °Incision care ° °  ° °· Follow instructions from your health care provider about how to take care of your port insertion site. Make sure you: °? Wash your hands with soap and water before and after you change your bandage (dressing). If soap and water are not available, use hand sanitizer. °? Change your dressing as told by your health care provider. °? Leave stitches (sutures), skin glue, or adhesive strips in place. These skin closures may need to stay in place for 2 weeks or longer. If adhesive strip edges start to loosen and curl up, you may trim the loose edges. Do not remove adhesive strips completely unless your health care provider tells you to do that. °· Check your port insertion site every day for signs of infection. Check for: °? Redness, swelling, or pain. °? Fluid or blood. °? Warmth. °? Pus or a bad smell. °Activity °· Return to your normal activities as told by your health care provider. Ask your health care provider what activities are safe for you. °· Do not  lift anything that is heavier than 10 lb (4.5 kg), or the limit that you are told, until your health care provider says that it is safe. °General instructions °· Take over-the-counter and prescription medicines only as told by your health care provider. °· Do not take baths, swim, or use a hot tub until your health care provider approves. Ask your health care provider if you may take showers. You may only be allowed to take sponge baths. °· Do not drive for 24 hours if you were given a sedative during your procedure. °· Wear a medical alert bracelet in case of an emergency. This will tell any health care providers that you have a port. °· Keep all follow-up visits as told by your health care provider. This is important. °Contact a health care provider if: °· You cannot flush your port with saline as directed, or you cannot draw blood from the port. °· You have a fever or chills. °· You have redness, swelling, or pain around your port insertion site. °· You have fluid or blood coming from your port insertion site. °· Your port insertion site feels warm to the touch. °· You have pus or a bad smell coming from the port insertion site. °Get help right away if: °· You have chest pain or shortness of breath. °· You have bleeding from your port that you cannot control. °Summary °· Take care of the port as told by your health   care provider. Keep the manufacturer's information card with you at all times. °· Change your dressing as told by your health care provider. °· Contact a health care provider if you have a fever or chills or if you have redness, swelling, or pain around your port insertion site. °· Keep all follow-up visits as told by your health care provider. °This information is not intended to replace advice given to you by your health care provider. Make sure you discuss any questions you have with your health care provider. °Document Released: 09/25/2013 Document Revised: 07/03/2018 Document Reviewed:  07/03/2018 °Elsevier Interactive Patient Education © 2019 Elsevier Inc. ° °Moderate Conscious Sedation, Adult, Care After °These instructions provide you with information about caring for yourself after your procedure. Your health care provider may also give you more specific instructions. Your treatment has been planned according to current medical practices, but problems sometimes occur. Call your health care provider if you have any problems or questions after your procedure. °What can I expect after the procedure? °After your procedure, it is common: °· To feel sleepy for several hours. °· To feel clumsy and have poor balance for several hours. °· To have poor judgment for several hours. °· To vomit if you eat too soon. °Follow these instructions at home: °For at least 24 hours after the procedure: ° °· Do not: °? Participate in activities where you could fall or become injured. °? Drive. °? Use heavy machinery. °? Drink alcohol. °? Take sleeping pills or medicines that cause drowsiness. °? Make important decisions or sign legal documents. °? Take care of children on your own. °· Rest. °Eating and drinking °· Follow the diet recommended by your health care provider. °· If you vomit: °? Drink water, juice, or soup when you can drink without vomiting. °? Make sure you have little or no nausea before eating solid foods. °General instructions °· Have a responsible adult stay with you until you are awake and alert. °· Take over-the-counter and prescription medicines only as told by your health care provider. °· If you smoke, do not smoke without supervision. °· Keep all follow-up visits as told by your health care provider. This is important. °Contact a health care provider if: °· You keep feeling nauseous or you keep vomiting. °· You feel light-headed. °· You develop a rash. °· You have a fever. °Get help right away if: °· You have trouble breathing. °This information is not intended to replace advice given to you  by your health care provider. Make sure you discuss any questions you have with your health care provider. °Document Released: 09/25/2013 Document Revised: 05/09/2016 Document Reviewed: 03/26/2016 °Elsevier Interactive Patient Education © 2019 Elsevier Inc. ° °

## 2019-04-12 NOTE — Procedures (Signed)
Interventional Radiology Procedure:   Indications: Small cell lung cancer  Procedure: Port placement  Findings: Right jugular port, tip at SVC/RA junction  Complications: None     EBL: Minimal, less than 10 ml  Plan: Discharge in one hour.  Keep port site and incisions dry for at least 24 hours.     Alexandru Moorer R. Anselm Pancoast, MD  Pager: [phone]

## 2019-04-12 NOTE — Consult Note (Signed)
Chief Complaint: Patient was seen in consultation today for port a cath placement  Referring Physician(s): Mohamed,Mohamed  Supervising Physician: Markus Daft  Patient Status: [facility] - Out-pt  History of Present Illness: Tammy Bowers is a 68 y.o. female smoker with history of recurrent small cell lung cancer, initially diagnosed with limited stage small cell lung cancer in November 2017 and who presented with large right hilar mass with mediastinal invasion and a right lower lobe pulmonary nodule.  She had disease recurrence in the right lower lobe and mediastinal lymph nodes in February of this year.  She has had prior prophylactic cranial irradiation and is currently on systemic chemotherapy.  She has poor venous access and presents today for Port-A-Cath placement for additional treatment.  Past Medical History:  Diagnosis Date  . Adenopathy 10/10/2016   PERICARINAL  . Anemia   . Arthritis   . Asthma   . Cancer ([place])    lung cancer Small Cell  . COPD (chronic obstructive pulmonary disease) ([place])   . Dyspnea   . Headache    migraines  . Hilar mass 10/10/2016   RIGHT  . History of kidney stones   . Insomnia   . Lung nodules 10/10/2016   RIGHT LOWER LOBE  . Mass of both adrenal glands ([place]) 10/10/2016  . Mood swing   . Pneumonia   . Spinal headache   . Substance abuse ([place])    TOBACCO  . UNSPECIFIED INFECTION OF BONE ANKLE AND FOOT 10/22/2009   Annotation: left ankle Qualifier: Diagnosis of  By: Gustavo Lah      Past Surgical History:  Procedure Laterality Date  . ANKLE SURGERY     hardware removal, and initial ankle surgery  . APPENDECTOMY    . LUNG BIOPSY N/A 10/19/2016   Procedure: LUNG BIOPSY;  Surgeon: Grace Isaac, MD;  Location: [facility];  Service: Thoracic;  Laterality: N/A;  . LUNG BIOPSY N/A 02/20/2019   Procedure: LUNG BIOPSY;  Surgeon: Grace Isaac, MD;  Location: [facility];  Service: Thoracic;  Laterality: N/A;  . TONSILLECTOMY    . VIDEO  BRONCHOSCOPY WITH ENDOBRONCHIAL ULTRASOUND N/A 10/19/2016   Procedure: VIDEO BRONCHOSCOPY WITH ENDOBRONCHIAL ULTRASOUND;  Surgeon: Grace Isaac, MD;  Location: [facility];  Service: Thoracic;  Laterality: N/A;  . VIDEO BRONCHOSCOPY WITH ENDOBRONCHIAL ULTRASOUND N/A 02/20/2019   Procedure: VIDEO BRONCHOSCOPY WITH ENDOBRONCHIAL ULTRASOUND;  Surgeon: Grace Isaac, MD;  Location: [facility] OR;  Service: Thoracic;  Laterality: N/A;    Allergies: Lidocaine  Medications: Prior to Admission medications   Medication Sig Start Date End Date Taking? Authorizing Provider  ibuprofen (ADVIL) 800 MG tablet Take 800 mg by mouth every 8 (eight) hours as needed.   Yes [provider]  LORazepam (ATIVAN) 0.5 MG tablet Take 1 tablet (0.5 mg total) by mouth at bedtime. Patient taking differently: Take 0.5 mg by mouth at bedtime as needed for anxiety.  10/28/16  Yes Gorsuch, Ni, MD  sertraline (ZOLOFT) 50 MG tablet Take 50 mg by mouth at bedtime. 04/01/18  Yes [provider]  albuterol (PROVENTIL HFA;VENTOLIN HFA) 108 (90 BASE) MCG/ACT inhaler Inhale 2 puffs into the lungs every 4 (four) hours as needed for wheezing.    [provider]  Cyclobenzaprine HCl (FLEXERIL PO) Take 5 mg by mouth 3 (three) times daily as needed (muscle spasms).  11/09/16   [provider]  diphenoxylate-atropine (LOMOTIL) 2.5-0.025 MG tablet Take 1 tablet by mouth 4 (four) times daily as needed for  diarrhea or loose stools. 03/30/19   Wyatt Portela, MD  Fluticasone-Salmeterol (ADVAIR) 500-50 MCG/DOSE AEPB Inhale 1 puff into the lungs 2 (two) times daily.    [provider]  Multiple Vitamin (MULTIVITAMIN WITH MINERALS) TABS tablet Take 1 tablet by mouth daily.     [provider]  prochlorperazine (COMPAZINE) 10 MG tablet TAKE 1 TABLET(10 MG) BY MOUTH EVERY 6 HOURS AS NEEDED FOR NAUSEA OR VOMITING 03/08/19   Curt Bears, MD     Family History  Problem Relation Age of Onset  . Heart  disease Father   . Colon cancer Father   . Heart disease Paternal Grandfather   . Ovarian cancer Mother     Social History   Socioeconomic History  . Marital status: Married    Spouse name: Not on file  . Number of children: 2  . Years of education: Not on file  . Highest education level: Not on file  Occupational History  . Occupation: retired  Scientific laboratory technician  . Financial resource strain: Not on file  . Food insecurity:    Worry: Not on file    Inability: Not on file  . Transportation needs:    Medical: Not on file    Non-medical: Not on file  Tobacco Use  . Smoking status: Current Every Day Smoker    Packs/day: 0.50    Years: 50.00    Pack years: 25.00    Types: Cigarettes    Last attempt to quit: 08/02/2016    Years since quitting: 2.6  . Smokeless tobacco: Never Used  Substance and Sexual Activity  . Alcohol use: Yes    Alcohol/week: 7.0 standard drinks    Types: 7 Shots of liquor per week  . Drug use: Not Currently    Types: Marijuana    Comment: August/Sept 2017 last ttime  . Sexual activity: Yes    Partners: Male    Birth control/protection: Post-menopausal  Lifestyle  . Physical activity:    Days per week: Not on file    Minutes per session: Not on file  . Stress: Not on file  Relationships  . Social connections:    Talks on phone: Not on file    Gets together: Not on file    Attends religious service: Not on file    Active member of club or organization: Not on file    Attends meetings of clubs or organizations: Not on file    Relationship status: Not on file  Other Topics Concern  . Not on file  Social History Narrative  . Not on file      Review of Systems denies fever, headache, abdominal pain, vomiting or bleeding.  She does have occasional chest discomfort, intermittent dyspnea/cough, back pain, intermittent nausea.  Vital Signs: BP 118/81   Pulse (!) 106   Temp 98.2 F (36.8 C) (Oral)   Resp 16   SpO2 96%   Physical Exam awake,  alert.  Chest with distant breath sounds bilaterally.  Heart with slightly tachycardic but regular rhythm.  Abdomen soft, positive bowel sounds, nontender.  No significant lower extremity edema.  Imaging: No results found.  Labs:  CBC: Recent Labs    02/15/19 1533 03/06/19 1122 03/11/19 0859 03/25/19 1133  WBC 8.0 8.2 5.1 11.6*  HGB 14.7 14.0 13.4 12.1  HCT 44.4 41.7 40.0 36.6  PLT 336 321 237 229    COAGS: Recent Labs    02/15/19 1533  INR 1.1  APTT 36  BMP: Recent Labs    02/15/19 1533 03/06/19 1122 03/11/19 0859 03/25/19 1133  NA 138 141 138 140  K 3.6 3.9 3.4* 3.4*  CL 105 106 103 105  CO2 23 22 22 25   GLUCOSE 95 91 137* 89  BUN 14 14 26* 19  CALCIUM 9.4 9.2 8.5* 9.2  CREATININE 1.07* 1.08* 1.10* 1.20*  GFRNONAA 54* 54* 53* 47*  GFRAA >60 >60 >60 55*    LIVER FUNCTION TESTS: Recent Labs    02/15/19 1533 03/06/19 1122 03/11/19 0859 03/25/19 1133  BILITOT 0.7 0.4 0.4 <0.2*  AST 16 12* 13* 12*  ALT 11 8 10 8   ALKPHOS 90 123 96 120  PROT 7.2 6.9 6.3* 6.6  ALBUMIN 3.7 3.3* 3.2* 3.3*    TUMOR MARKERS: No results for input(s): AFPTM, CEA, CA199, CHROMGRNA in the last 8760 hours.  Assessment and Plan: 68 y.o. female smoker with history of recurrent small cell lung cancer, initially diagnosed with limited stage small cell lung cancer in November 2017 and who presented with large right hilar mass with mediastinal invasion and a right lower lobe pulmonary nodule.  She had disease recurrence in the right lower lobe and mediastinal lymph nodes in February of this year.  She has had prior prophylactic cranial irradiation and is currently on systemic chemotherapy.  She has poor venous access and presents today for Port-A-Cath placement for additional treatment. Risks and benefits of image guided port-a-catheter placement was discussed with the patient including, but not limited to bleeding, infection, pneumothorax, or fibrin sheath development and need for  additional procedures.  All of the patient's questions were answered, patient is agreeable to proceed. Consent signed and in chart.  She has had recent injection to raise WBC , currently 17.1.   Thank you for this interesting consult.  I greatly enjoyed meeting Tammy Bowers and look forward to participating in their care.  A copy of this report was sent to the requesting provider on this date.  Electronically Signed: D. Rowe Robert, PA-C 04/12/2019, 10:20 AM   I spent a total of  25 minutes   in face to face in clinical consultation, greater than 50% of which was counseling/coordinating care for Port-A-Cath placement

## 2019-04-16 ENCOUNTER — Other Ambulatory Visit: Payer: Medicare Other

## 2019-04-16 ENCOUNTER — Ambulatory Visit: Payer: Medicare Other

## 2019-04-16 ENCOUNTER — Ambulatory Visit: Payer: Medicare Other | Admitting: Internal Medicine

## 2019-04-17 ENCOUNTER — Inpatient Hospital Stay: Payer: Medicare Other

## 2019-04-17 ENCOUNTER — Ambulatory Visit (HOSPITAL_BASED_OUTPATIENT_CLINIC_OR_DEPARTMENT_OTHER): Payer: Medicare Other | Admitting: Medical

## 2019-04-17 ENCOUNTER — Other Ambulatory Visit: Payer: Self-pay

## 2019-04-17 ENCOUNTER — Other Ambulatory Visit: Payer: Self-pay | Admitting: Medical Oncology

## 2019-04-17 ENCOUNTER — Other Ambulatory Visit: Payer: Self-pay | Admitting: Internal Medicine

## 2019-04-17 VITALS — BP 130/68 | HR 90 | Temp 98.4°F | Resp 17 | Wt 219.5 lb

## 2019-04-17 DIAGNOSIS — C3411 Malignant neoplasm of upper lobe, right bronchus or lung: Secondary | ICD-10-CM

## 2019-04-17 DIAGNOSIS — J449 Chronic obstructive pulmonary disease, unspecified: Secondary | ICD-10-CM

## 2019-04-17 DIAGNOSIS — C3491 Malignant neoplasm of unspecified part of right bronchus or lung: Secondary | ICD-10-CM

## 2019-04-17 DIAGNOSIS — Z5112 Encounter for antineoplastic immunotherapy: Secondary | ICD-10-CM | POA: Diagnosis not present

## 2019-04-17 DIAGNOSIS — G4700 Insomnia, unspecified: Secondary | ICD-10-CM | POA: Diagnosis not present

## 2019-04-17 DIAGNOSIS — M1990 Unspecified osteoarthritis, unspecified site: Secondary | ICD-10-CM | POA: Diagnosis not present

## 2019-04-17 DIAGNOSIS — Z5189 Encounter for other specified aftercare: Secondary | ICD-10-CM | POA: Diagnosis not present

## 2019-04-17 DIAGNOSIS — Z79899 Other long term (current) drug therapy: Secondary | ICD-10-CM

## 2019-04-17 DIAGNOSIS — Z5111 Encounter for antineoplastic chemotherapy: Secondary | ICD-10-CM | POA: Diagnosis not present

## 2019-04-17 LAB — CMP (CANCER CENTER ONLY)
ALT: <6 U/L (ref 0–44)
AST: 10 U/L — ABNORMAL LOW (ref 15–41)
Albumin: 3.2 g/dL — ABNORMAL LOW (ref 3.5–5.0)
Alkaline Phosphatase: 106 U/L (ref 38–126)
Anion gap: 8 (ref 5–15)
BUN: 16 mg/dL (ref 8–23)
CO2: 26 mmol/L (ref 22–32)
Calcium: 8.8 mg/dL — ABNORMAL LOW (ref 8.9–10.3)
Chloride: 106 mmol/L (ref 98–111)
Creatinine: 0.97 mg/dL (ref 0.44–1.00)
GFR, Est AFR Am: >60 mL/min (ref >60)
GFR, Estimated: >60 mL/min (ref >60)
Glucose, Bld: 87 mg/dL (ref 70–99)
Potassium: 3.5 mmol/L (ref 3.5–5.1)
Sodium: 140 mmol/L (ref 135–145)
Total Bilirubin: <0.2 mg/dL — ABNORMAL LOW (ref 0.3–1.2)
Total Protein: 6.4 g/dL — ABNORMAL LOW (ref 6.5–8.1)

## 2019-04-17 LAB — CBC WITH DIFFERENTIAL (CANCER CENTER ONLY)
Abs Immature Granulocytes: 0.06 10*3/uL (ref 0.00–0.07)
Basophils Absolute: 0.1 10*3/uL (ref 0.0–0.1)
Basophils Relative: 1 %
Eosinophils Absolute: 0.1 10*3/uL (ref 0.0–0.5)
Eosinophils Relative: 1 %
HCT: 30.7 % — ABNORMAL LOW (ref 36.0–46.0)
Hemoglobin: 10.2 g/dL — ABNORMAL LOW (ref 12.0–15.0)
Immature Granulocytes: 1 %
Lymphocytes Relative: 7 %
Lymphs Abs: 0.8 10*3/uL (ref 0.7–4.0)
MCH: 30.1 pg (ref 26.0–34.0)
MCHC: 33.2 g/dL (ref 30.0–36.0)
MCV: 90.6 fL (ref 80.0–100.0)
Monocytes Absolute: 0.7 10*3/uL (ref 0.1–1.0)
Monocytes Relative: 7 %
Neutro Abs: 8.6 10*3/uL — ABNORMAL HIGH (ref 1.7–7.7)
Neutrophils Relative %: 83 %
Platelet Count: 360 10*3/uL (ref 150–400)
RBC: 3.39 MIL/uL — ABNORMAL LOW (ref 3.87–5.11)
RDW: 14.4 % (ref 11.5–15.5)
WBC Count: 10.3 10*3/uL (ref 4.0–10.5)
nRBC: 0.3 % — ABNORMAL HIGH (ref 0.0–0.2)

## 2019-04-17 MED ORDER — SODIUM CHLORIDE 0.9 % IV SOLN: 563.0000 mg | Freq: Once | INTRAVENOUS | Status: DC

## 2019-04-17 MED ORDER — PALONOSETRON HCL INJECTION 0.25 MG/5ML: 0.2500 mg | Freq: Once | INTRAVENOUS | Status: DC

## 2019-04-17 MED ORDER — SODIUM CHLORIDE 0.9 % IV SOLN: Freq: Once | INTRAVENOUS | Status: DC

## 2019-04-17 MED ORDER — PALONOSETRON HCL INJECTION 0.25 MG/5ML
INTRAVENOUS | Status: AC
  Filled 2019-04-17: qty 5

## 2019-04-17 MED ORDER — SODIUM CHLORIDE 0.9% FLUSH
10.0000 mL | INTRAVENOUS | Status: DC | PRN
  Filled 2019-04-17: qty 10

## 2019-04-17 MED ORDER — SODIUM CHLORIDE 0.9 % IV SOLN
Freq: Once | INTRAVENOUS | Status: DC
  Filled 2019-04-17: qty 250

## 2019-04-17 MED ORDER — HEPARIN SOD (PORK) LOCK FLUSH 100 UNIT/ML IV SOLN
500.0000 [IU] | Freq: Once | INTRAVENOUS | Status: DC | PRN
  Filled 2019-04-17: qty 5

## 2019-04-17 MED ORDER — SODIUM CHLORIDE 0.9 % IV SOLN: 1200.0000 mg | Freq: Once | INTRAVENOUS | Status: DC

## 2019-04-17 MED ORDER — SODIUM CHLORIDE 0.9 % IV SOLN: 100.0000 mg/m2 | Freq: Once | INTRAVENOUS | Status: DC

## 2019-04-17 NOTE — Progress Notes (Signed)
Pt prefers to delay tx to tomorrow due to being late in the evening today to begin tx. Orders have been deferred. Pt will be treated Day 3 on 04/20/19 (Saturday).  Etoposide will be mixed on Fri 5/1 and is stable for 48 hrs. Sandi Mealy, PA aware. Coordinated w/ Smiley Houseman, RN [facility] [place], Pharm.D., CPP 04/17/2019@4 :25 PM

## 2019-04-17 NOTE — Patient Instructions (Signed)
[facility] Discharge Instructions for Patients Receiving Chemotherapy  Today you received the following chemotherapy agents Atezolizumab (TECENTRIQ), Carboplatin (PARAPLATIN) & Etoposide (VEPESID).  To help prevent nausea and vomiting after your treatment, we encourage you to take your nausea medication as prescribed.   If you develop nausea and vomiting that is not controlled by your nausea medication, call the clinic.   BELOW ARE SYMPTOMS THAT SHOULD BE REPORTED IMMEDIATELY:  *FEVER GREATER THAN 100.5 F  *CHILLS WITH OR WITHOUT FEVER  NAUSEA AND VOMITING THAT IS NOT CONTROLLED WITH YOUR NAUSEA MEDICATION  *UNUSUAL SHORTNESS OF BREATH  *UNUSUAL BRUISING OR BLEEDING  TENDERNESS IN MOUTH AND THROAT WITH OR WITHOUT PRESENCE OF ULCERS  *URINARY PROBLEMS  *BOWEL PROBLEMS  UNUSUAL RASH Items with * indicate a potential emergency and should be followed up as soon as possible.  Feel free to call the clinic should you have any questions or concerns. The clinic phone number is [phone].  Please show the [facility] at check-in to the Emergency Department and triage nurse.  Coronavirus (COVID-19) Are you at risk?  Are you at risk for the Coronavirus (COVID-19)?  To be considered HIGH RISK for Coronavirus (COVID-19), you have to meet the following criteria:  . Traveled to [place], [place], [place], [place] or [place]; or in the [place] to [place], [place], [place], or [place]; and have fever, cough, and shortness of breath within the last 2 weeks of travel OR . Been in close contact with a person diagnosed with COVID-19 within the last 2 weeks and have fever, cough, and shortness of breath . IF YOU DO NOT MEET THESE CRITERIA, YOU ARE CONSIDERED LOW RISK FOR COVID-19.  What to do if you are HIGH RISK for COVID-19?  Marland Kitchen If you are having a medical emergency, call 911. . Seek medical care right away. Before you go to a doctor's office, urgent care  or emergency department, call ahead and tell them about your recent travel, contact with someone diagnosed with COVID-19, and your symptoms. You should receive instructions from your physician's office regarding next steps of care.  . When you arrive at healthcare provider, tell the healthcare staff immediately you have returned from visiting [place], [place], [place], [place] or [place]; or traveled in the [place] to [place], [place], [place], or [place]; in the last two weeks or you have been in close contact with a person diagnosed with COVID-19 in the last 2 weeks.   . Tell the health care staff about your symptoms: fever, cough and shortness of breath. . After you have been seen by a medical provider, you will be either: o Tested for (COVID-19) and discharged home on quarantine except to seek medical care if symptoms worsen, and asked to  - Stay home and avoid contact with others until you get your results (4-5 days)  - Avoid travel on public transportation if possible (such as bus, train, or airplane) or o Sent to the Emergency Department by EMS for evaluation, COVID-19 testing, and possible admission depending on your condition and test results.  What to do if you are LOW RISK for COVID-19?  Reduce your risk of any infection by using the same precautions used for avoiding the common cold or flu:  Marland Kitchen Wash your hands often with soap and warm water for at least 20 seconds.  If soap and water are not readily available, use an alcohol-based hand sanitizer with at least 60% alcohol.  Marland Kitchen  If coughing or sneezing, cover your mouth and nose by coughing or sneezing into the elbow areas of your shirt or coat, into a tissue or into your sleeve (not your hands). . Avoid shaking hands with others and consider head nods or verbal greetings only. . Avoid touching your eyes, nose, or mouth with unwashed hands.  . Avoid close contact with people who are sick. . Avoid places or events with large  numbers of people in one location, like concerts or sporting events. . Carefully consider travel plans you have or are making. . If you are planning any travel outside or inside the [place], visit the CDC's Travelers' Health webpage for the latest health notices. . If you have some symptoms but not all symptoms, continue to monitor at home and seek medical attention if your symptoms worsen. . If you are having a medical emergency, call 911.   [place] / e-Visit: [email or web address]         [facility]: [facility]: [phone]                   [facility]: [phone]

## 2019-04-18 ENCOUNTER — Telehealth: Payer: Self-pay | Admitting: Internal Medicine

## 2019-04-18 ENCOUNTER — Other Ambulatory Visit: Payer: Self-pay | Admitting: Internal Medicine

## 2019-04-18 ENCOUNTER — Other Ambulatory Visit: Payer: Self-pay

## 2019-04-18 ENCOUNTER — Inpatient Hospital Stay: Payer: Medicare Other

## 2019-04-18 VITALS — BP 91/58 | HR 98 | Temp 98.8°F | Resp 16

## 2019-04-18 DIAGNOSIS — J449 Chronic obstructive pulmonary disease, unspecified: Secondary | ICD-10-CM | POA: Diagnosis not present

## 2019-04-18 DIAGNOSIS — Z5111 Encounter for antineoplastic chemotherapy: Secondary | ICD-10-CM | POA: Diagnosis not present

## 2019-04-18 DIAGNOSIS — C3411 Malignant neoplasm of upper lobe, right bronchus or lung: Secondary | ICD-10-CM | POA: Diagnosis not present

## 2019-04-18 DIAGNOSIS — Z5189 Encounter for other specified aftercare: Secondary | ICD-10-CM | POA: Diagnosis not present

## 2019-04-18 DIAGNOSIS — Z5112 Encounter for antineoplastic immunotherapy: Secondary | ICD-10-CM | POA: Diagnosis not present

## 2019-04-18 DIAGNOSIS — G4700 Insomnia, unspecified: Secondary | ICD-10-CM | POA: Diagnosis not present

## 2019-04-18 DIAGNOSIS — C3491 Malignant neoplasm of unspecified part of right bronchus or lung: Secondary | ICD-10-CM

## 2019-04-18 MED ORDER — SODIUM CHLORIDE 0.9 % IV SOLN
520.0000 mg | Freq: Once | INTRAVENOUS | Status: AC
  Administered 2019-04-18: 520 mg via INTRAVENOUS
  Filled 2019-04-18: qty 52

## 2019-04-18 MED ORDER — PALONOSETRON HCL INJECTION 0.25 MG/5ML
INTRAVENOUS | Status: AC
  Filled 2019-04-18: qty 5

## 2019-04-18 MED ORDER — SODIUM CHLORIDE 0.9 % IV SOLN
Freq: Once | INTRAVENOUS | Status: AC
  Administered 2019-04-18: 13:00:00 via INTRAVENOUS
  Filled 2019-04-18: qty 250

## 2019-04-18 MED ORDER — SODIUM CHLORIDE 0.9 % IV SOLN
Freq: Once | INTRAVENOUS | Status: AC
  Administered 2019-04-18: 13:00:00 via INTRAVENOUS
  Filled 2019-04-18: qty 5

## 2019-04-18 MED ORDER — HEPARIN SOD (PORK) LOCK FLUSH 100 UNIT/ML IV SOLN
500.0000 [IU] | Freq: Once | INTRAVENOUS | Status: AC | PRN
  Administered 2019-04-18: 17:00:00 500 [IU]
  Filled 2019-04-18: qty 5

## 2019-04-18 MED ORDER — SODIUM CHLORIDE 0.9% FLUSH
10.0000 mL | INTRAVENOUS | Status: DC | PRN
  Administered 2019-04-18: 10 mL
  Filled 2019-04-18: qty 10

## 2019-04-18 MED ORDER — SODIUM CHLORIDE 0.9 % IV SOLN
92.0000 mg/m2 | Freq: Once | INTRAVENOUS | Status: AC
  Administered 2019-04-18: 15:00:00 200 mg via INTRAVENOUS
  Filled 2019-04-18: qty 10

## 2019-04-18 MED ORDER — PALONOSETRON HCL INJECTION 0.25 MG/5ML
0.2500 mg | Freq: Once | INTRAVENOUS | Status: AC
  Administered 2019-04-18: 0.25 mg via INTRAVENOUS

## 2019-04-18 MED ORDER — SODIUM CHLORIDE 0.9 % IV SOLN
1200.0000 mg | Freq: Once | INTRAVENOUS | Status: AC
  Administered 2019-04-18: 1200 mg via INTRAVENOUS
  Filled 2019-04-18: qty 20

## 2019-04-18 NOTE — Patient Instructions (Signed)
[facility] Discharge Instructions for Patients Receiving Chemotherapy  Today you received the following chemotherapy agents Atezolizumab (TECENTRIQ), Carboplatin (PARAPLATIN) & Etoposide (VEPESID).  To help prevent nausea and vomiting after your treatment, we encourage you to take your nausea medication as prescribed.   If you develop nausea and vomiting that is not controlled by your nausea medication, call the clinic.   BELOW ARE SYMPTOMS THAT SHOULD BE REPORTED IMMEDIATELY:  *FEVER GREATER THAN 100.5 F  *CHILLS WITH OR WITHOUT FEVER  NAUSEA AND VOMITING THAT IS NOT CONTROLLED WITH YOUR NAUSEA MEDICATION  *UNUSUAL SHORTNESS OF BREATH  *UNUSUAL BRUISING OR BLEEDING  TENDERNESS IN MOUTH AND THROAT WITH OR WITHOUT PRESENCE OF ULCERS  *URINARY PROBLEMS  *BOWEL PROBLEMS  UNUSUAL RASH Items with * indicate a potential emergency and should be followed up as soon as possible.  Feel free to call the clinic should you have any questions or concerns. The clinic phone number is [phone].  Please show the [facility] at check-in to the Emergency Department and triage nurse.  Coronavirus (COVID-19) Are you at risk?  Are you at risk for the Coronavirus (COVID-19)?  To be considered HIGH RISK for Coronavirus (COVID-19), you have to meet the following criteria:  . Traveled to [place], [place], [place], [place] or [place]; or in the [place] to [place], [place], [place], or [place]; and have fever, cough, and shortness of breath within the last 2 weeks of travel OR . Been in close contact with a person diagnosed with COVID-19 within the last 2 weeks and have fever, cough, and shortness of breath . IF YOU DO NOT MEET THESE CRITERIA, YOU ARE CONSIDERED LOW RISK FOR COVID-19.  What to do if you are HIGH RISK for COVID-19?  Marland Kitchen If you are having a medical emergency, call 911. . Seek medical care right away. Before you go to a doctor's office, urgent care  or emergency department, call ahead and tell them about your recent travel, contact with someone diagnosed with COVID-19, and your symptoms. You should receive instructions from your physician's office regarding next steps of care.  . When you arrive at healthcare provider, tell the healthcare staff immediately you have returned from visiting [place], [place], [place], [place] or [place]; or traveled in the [place] to [place], [place], [place], or [place]; in the last two weeks or you have been in close contact with a person diagnosed with COVID-19 in the last 2 weeks.   . Tell the health care staff about your symptoms: fever, cough and shortness of breath. . After you have been seen by a medical provider, you will be either: o Tested for (COVID-19) and discharged home on quarantine except to seek medical care if symptoms worsen, and asked to  - Stay home and avoid contact with others until you get your results (4-5 days)  - Avoid travel on public transportation if possible (such as bus, train, or airplane) or o Sent to the Emergency Department by EMS for evaluation, COVID-19 testing, and possible admission depending on your condition and test results.  What to do if you are LOW RISK for COVID-19?  Reduce your risk of any infection by using the same precautions used for avoiding the common cold or flu:  Marland Kitchen Wash your hands often with soap and warm water for at least 20 seconds.  If soap and water are not readily available, use an alcohol-based hand sanitizer with at least 60% alcohol.  Marland Kitchen  If coughing or sneezing, cover your mouth and nose by coughing or sneezing into the elbow areas of your shirt or coat, into a tissue or into your sleeve (not your hands). . Avoid shaking hands with others and consider head nods or verbal greetings only. . Avoid touching your eyes, nose, or mouth with unwashed hands.  . Avoid close contact with people who are sick. . Avoid places or events with large  numbers of people in one location, like concerts or sporting events. . Carefully consider travel plans you have or are making. . If you are planning any travel outside or inside the [place], visit the CDC's Travelers' Health webpage for the latest health notices. . If you have some symptoms but not all symptoms, continue to monitor at home and seek medical attention if your symptoms worsen. . If you are having a medical emergency, call 911.   [place] / e-Visit: [email or web address]         [facility]: [facility]: [phone]                   [facility]: [phone]

## 2019-04-18 NOTE — Progress Notes (Signed)
Symptoms Management Clinic Progress Note   Tammy Bowers [id number] [birth year] 68 y.o.  Tammy Bowers is managed by Dr. Fanny Bien. Mohamed  Actively treated with chemotherapy/immunotherapy/hormonal therapy: yes  Current therapy: Carboplatin and etoposide with pegfilgrastim support  Assessment: Plan:    Small cell lung carcinoma, right (HCC)   Recurrent small cell lung cancer: The patient was seen in infusion today she presents for cycle 3, day 1 of carboplatin and etoposide.  Her labs were reviewed.  She will proceed with her treatment.  Please see After Visit Summary for patient specific instructions.  Future Appointments  Date Time Provider [place]  04/18/2019  1:00 PM [facility] INFUSION [facility] None  04/19/2019  2:00 PM [facility] INF[facility]ION [facility] None  04/20/2019  9:00 AM [facility] INFUSION [facility] None    No orders of the defined types were placed in this encounter.      Subjective:   Patient ID:  Tammy Bowers is a 68 y.o. (DOB 1951-06-02) female.  Chief Complaint: No chief complaint on file.   HPI Tammy Bowers   is a 68 year old female with a history of recurrent small cell lung cancer which was additionally diagnosed in November 2017 when she presented with a large right hilar mass with mediastinal invasion and a right lower lobe pulmonary nodule.  She was found to have a recurrence of her cancer in February 2020.  She was seen in infusion today as she presents for cycle 3, day 1 of carboplatin and etoposide with Udenyca support.  She denies any issues of concern today.  She denies fevers, chills, sweats, headaches, pain, cough, shortness of breath over her baseline, nausea, vomiting, constipation, or diarrhea.  Medications: I have reviewed the patient's current medications.  Allergies:  Allergies  Allergen Reactions  . Lidocaine Swelling    SWELLING REACTION UNSPECIFIED     Past Medical History:  Diagnosis Date  . Adenopathy 10/10/2016   PERICARINAL  . Anemia   . Arthritis   . Asthma   . Cancer ([facility])    lung cancer Small Cell  . COPD (chronic obstructive pulmonary disease) ([facility])   . Dyspnea   . Headache    migraines  . Hilar mass 10/10/2016   RIGHT  . History of kidney stones   . Insomnia   . Lung nodules 10/10/2016   RIGHT LOWER LOBE  . Mass of both adrenal glands ([facility]) 10/10/2016  . Mood swing   . Pneumonia   . Spinal headache   . Substance abuse ([place])    TOBACCO  . UNSPECIFIED INFECTION OF BONE ANKLE AND FOOT 10/22/2009   Annotation: left ankle Qualifier: Diagnosis of  By: Gustavo Lah      Past Surgical History:  Procedure Laterality Date  . ANKLE SURGERY     hardware removal, and initial ankle surgery  . APPENDECTOMY    . IR IMAGING GUIDED PORT INSERTION  04/12/2019  . LUNG BIOPSY N/A 10/19/2016   Procedure: LUNG BIOPSY;  Surgeon: Grace Isaac, MD;  Location: [facility];  Service: Thoracic;  Laterality: N/A;  . LUNG BIOPSY N/A 02/20/2019   Procedure: LUNG BIOPSY;  Surgeon: Grace Isaac, MD;  Location: [facility];  Service: Thoracic;  Laterality: N/A;  . TONSILLECTOMY    . VIDEO BRONCHOSCOPY WITH ENDOBRONCHIAL ULTRASOUND N/A 10/19/2016   Procedure: VIDEO BRONCHOSCOPY WITH ENDOBRONCHIAL ULTRASOUND;  Surgeon: Grace Isaac, MD;  Location: [facility];  Service: Thoracic;  Laterality: N/A;  . VIDEO BRONCHOSCOPY WITH ENDOBRONCHIAL ULTRASOUND N/A 02/20/2019  Procedure: VIDEO BRONCHOSCOPY WITH ENDOBRONCHIAL ULTRASOUND;  Surgeon: Grace Isaac, MD;  Location: [facility] OR;  Service: Thoracic;  Laterality: N/A;    Family History  Problem Relation Age of Onset  . Heart disease Father   . Colon cancer Father   . Heart disease Paternal Grandfather   . Ovarian cancer Mother     Social History   Socioeconomic History  . Marital status: Married    Spouse name: Not on file  . Number of children: 2  . Years of education: Not on file  . Highest education level: Not on file  Occupational History  .  Occupation: retired  Scientific laboratory technician  . Financial resource strain: Not on file  . Food insecurity:    Worry: Not on file    Inability: Not on file  . Transportation needs:    Medical: Not on file    Non-medical: Not on file  Tobacco Use  . Smoking status: Current Every Day Smoker    Packs/day: 0.50    Years: 50.00    Pack years: 25.00    Types: Cigarettes    Last attempt to quit: 08/02/2016    Years since quitting: 2.7  . Smokeless tobacco: Never Used  Substance and Sexual Activity  . Alcohol use: Yes    Alcohol/week: 7.0 standard drinks    Types: 7 Shots of liquor per week  . Drug use: Not Currently    Types: Marijuana    Comment: August/Sept 2017 last ttime  . Sexual activity: Yes    Partners: Male    Birth control/protection: Post-menopausal  Lifestyle  . Physical activity:    Days per week: Not on file    Minutes per session: Not on file  . Stress: Not on file  Relationships  . Social connections:    Talks on phone: Not on file    Gets together: Not on file    Attends religious service: Not on file    Active member of club or organization: Not on file    Attends meetings of clubs or organizations: Not on file    Relationship status: Not on file  . Intimate partner violence:    Fear of current or ex partner: Not on file    Emotionally abused: Not on file    Physically abused: Not on file    Forced sexual activity: Not on file  Other Topics Concern  . Not on file  Social History Narrative  . Not on file    Past Medical History, Surgical history, Social history, and Family history were reviewed and updated as appropriate.   Please see review of systems for further details on the patient's review from today.   Review of Systems:  Review of Systems  Constitutional: Negative for chills, diaphoresis and fever.  HENT: Negative for trouble swallowing and voice change.   Respiratory: Positive for shortness of breath. Negative for cough, choking, chest tightness,  wheezing and stridor.   Cardiovascular: Negative for chest pain and palpitations.  Gastrointestinal: Negative for abdominal pain, constipation, diarrhea, nausea and vomiting.  Musculoskeletal: Negative for back pain and myalgias.  Neurological: Negative for dizziness, light-headedness and headaches.    Objective:   Physical Exam:  There were no vitals taken for this visit. ECOG: 1  Physical Exam Constitutional:      General: She is not in acute distress.    Appearance: She is not diaphoretic.  HENT:     Head: Normocephalic and atraumatic.  Eyes:  General: No scleral icterus.       Right eye: No discharge.        Left eye: No discharge.     Conjunctiva/sclera: Conjunctivae normal.  Cardiovascular:     Rate and Rhythm: Normal rate and regular rhythm.     Heart sounds: Normal heart sounds. No murmur. No friction rub. No gallop.   Pulmonary:     Effort: Pulmonary effort is normal. No respiratory distress.     Breath sounds: Normal breath sounds. No wheezing or rales.  Skin:    General: Skin is warm and dry.     Findings: No erythema or rash.  Neurological:     Mental Status: She is alert.     Lab Review:     Component Value Date/Time   NA 140 04/17/2019 1503   NA 139 11/24/2017 0913   K 3.5 04/17/2019 1503   K 3.8 11/24/2017 0913   CL 106 04/17/2019 1503   CO2 26 04/17/2019 1503   CO2 24 11/24/2017 0913   GLUCOSE 87 04/17/2019 1503   GLUCOSE 82 11/24/2017 0913   BUN 16 04/17/2019 1503   BUN 22.1 11/24/2017 0913   CREATININE 0.97 04/17/2019 1503   CREATININE 1.2 (H) 11/24/2017 0913   CALCIUM 8.8 (L) 04/17/2019 1503   CALCIUM 9.7 11/24/2017 0913   PROT 6.4 (L) 04/17/2019 1503   PROT 6.7 11/24/2017 0913   ALBUMIN 3.2 (L) 04/17/2019 1503   ALBUMIN 3.7 11/24/2017 0913   AST 10 (L) 04/17/2019 1503   AST 15 11/24/2017 0913   ALT <6 04/17/2019 1503   ALT 7 11/24/2017 0913   ALKPHOS 106 04/17/2019 1503   ALKPHOS 99 11/24/2017 0913   BILITOT <0.2 (L) 04/17/2019  1503   BILITOT 0.37 11/24/2017 0913   GFRNONAA >60 04/17/2019 1503   GFRAA >60 04/17/2019 1503       Component Value Date/Time   WBC 10.3 04/17/2019 1503   WBC 17.1 (H) 04/12/2019 1029   RBC 3.39 (L) 04/17/2019 1503   HGB 10.2 (L) 04/17/2019 1503   HGB 12.7 11/24/2017 0913   HCT 30.7 (L) 04/17/2019 1503   HCT 37.4 11/24/2017 0913   PLT 360 04/17/2019 1503   PLT 240 11/24/2017 0913   MCV 90.6 04/17/2019 1503   MCV 90.6 11/24/2017 0913   MCH 30.1 04/17/2019 1503   MCHC 33.2 04/17/2019 1503   RDW 14.4 04/17/2019 1503   RDW 12.2 11/24/2017 0913   LYMPHSABS 0.8 04/17/2019 1503   LYMPHSABS 1.0 11/24/2017 0913   MONOABS 0.7 04/17/2019 1503   MONOABS 0.4 11/24/2017 0913   EOSABS 0.1 04/17/2019 1503   EOSABS 0.1 11/24/2017 0913   BASOSABS 0.1 04/17/2019 1503   BASOSABS 0.0 11/24/2017 0913   -------------------------------  Imaging from last 24 hours (if applicable):  Radiology interpretation: Ir Imaging Guided Port Insertion  Result Date: 04/12/2019 INDICATION: 68 year old with small cell lung cancer. Port-A-Cath needed for therapy. EXAM: FLUOROSCOPIC AND ULTRASOUND GUIDED PLACEMENT OF A SUBCUTANEOUS PORT COMPARISON:  None. MEDICATIONS: Ancef 2 g; The antibiotic was administered within an appropriate time interval prior to skin puncture. ANESTHESIA/SEDATION: Versed 4.0 mg IV; Fentanyl 100 mcg IV; Moderate Sedation Time:  33 minutes The patient was continuously monitored during the procedure by the interventional radiology nurse under my direct supervision. FLUOROSCOPY TIME:  12 seconds, 1 mGy COMPLICATIONS: None immediate. PROCEDURE: The procedure, risks, benefits, and alternatives were explained to the patient. Questions regarding the procedure were encouraged and answered. The patient understands and consents to the procedure.  Patient was placed supine on the interventional table. Ultrasound confirmed a patent right internal jugular vein. The right chest and neck were cleaned with a  skin antiseptic and a sterile drape was placed. Maximal barrier sterile technique was utilized including caps, mask, sterile gowns, sterile gloves, sterile drape, hand hygiene and skin antiseptic. The right neck was anesthetized with 1% lidocaine. Small incision was made in the right neck with a blade. Micropuncture set was placed in the right internal jugular vein with ultrasound guidance. The micropuncture wire was used for measurement purposes. The right chest was anesthetized with 1% lidocaine with epinephrine. #15 blade was used to make an incision and a subcutaneous port pocket was formed. [place] was assembled. Subcutaneous tunnel was formed with a stiff tunneling device. The port catheter was brought through the subcutaneous tunnel. The port was placed in the subcutaneous pocket and sutured in place. The micropuncture set was exchanged for a peel-away sheath. The catheter was placed through the peel-away sheath and the tip was positioned at the SVC and right atrium junction. Catheter placement was confirmed with fluoroscopy. The port was accessed and flushed with heparinized saline. The port pocket was closed using two layers of absorbable sutures and Dermabond. The vein skin site was closed using a single layer of absorbable suture and Dermabond. Sterile dressings were applied. Patient tolerated the procedure well without an immediate complication. Ultrasound and fluoroscopic images were taken and saved for this procedure. IMPRESSION: Placement of a subcutaneous port device. Electronically Signed   By: Markus Daft M.D.   On: 04/12/2019 12:57         Please see pharmacy note below:  Pt prefers to delay tx to tomorrow due to being late in the evening today to begin tx. Orders have been deferred. Pt will be treated Day 3 on 04/20/19 (Saturday).  Etoposide will be mixed on Fri 5/1 and is stable for 48 hrs. Sandi Mealy, PA aware. Coordinated w/ Smiley Houseman, RN [facility] [place], Pharm.D., CPP  04/17/2019@4 :25 PM  Sandi Mealy, PA-C will be on site Saturday for day 3 of treatment.

## 2019-04-18 NOTE — Telephone Encounter (Signed)
Future appointments added per staff message exchange with MM 4/29 and 4/30. Spoke with patient and also infusion charge nurse. Patient to get updated schedule today.

## 2019-04-19 ENCOUNTER — Other Ambulatory Visit: Payer: Self-pay

## 2019-04-19 ENCOUNTER — Inpatient Hospital Stay: Payer: Medicare Other | Attending: Family Medicine

## 2019-04-19 ENCOUNTER — Ambulatory Visit: Payer: Medicare Other

## 2019-04-19 VITALS — BP 142/87 | HR 99 | Temp 98.1°F | Resp 16

## 2019-04-19 DIAGNOSIS — G4700 Insomnia, unspecified: Secondary | ICD-10-CM | POA: Insufficient documentation

## 2019-04-19 DIAGNOSIS — R918 Other nonspecific abnormal finding of lung field: Secondary | ICD-10-CM | POA: Insufficient documentation

## 2019-04-19 DIAGNOSIS — Z79899 Other long term (current) drug therapy: Secondary | ICD-10-CM | POA: Diagnosis not present

## 2019-04-19 DIAGNOSIS — Z5111 Encounter for antineoplastic chemotherapy: Secondary | ICD-10-CM | POA: Insufficient documentation

## 2019-04-19 DIAGNOSIS — C3411 Malignant neoplasm of upper lobe, right bronchus or lung: Secondary | ICD-10-CM | POA: Insufficient documentation

## 2019-04-19 DIAGNOSIS — Z5112 Encounter for antineoplastic immunotherapy: Secondary | ICD-10-CM | POA: Diagnosis not present

## 2019-04-19 DIAGNOSIS — C3491 Malignant neoplasm of unspecified part of right bronchus or lung: Secondary | ICD-10-CM

## 2019-04-19 DIAGNOSIS — Z7951 Long term (current) use of inhaled steroids: Secondary | ICD-10-CM | POA: Diagnosis not present

## 2019-04-19 DIAGNOSIS — J449 Chronic obstructive pulmonary disease, unspecified: Secondary | ICD-10-CM | POA: Insufficient documentation

## 2019-04-19 DIAGNOSIS — Z791 Long term (current) use of non-steroidal anti-inflammatories (NSAID): Secondary | ICD-10-CM | POA: Diagnosis not present

## 2019-04-19 DIAGNOSIS — M1990 Unspecified osteoarthritis, unspecified site: Secondary | ICD-10-CM | POA: Insufficient documentation

## 2019-04-19 MED ORDER — SODIUM CHLORIDE 0.9 % IV SOLN
92.0000 mg/m2 | Freq: Once | INTRAVENOUS | Status: AC
  Administered 2019-04-19: 200 mg via INTRAVENOUS
  Filled 2019-04-19: qty 10

## 2019-04-19 MED ORDER — DEXAMETHASONE SODIUM PHOSPHATE 10 MG/ML IJ SOLN
INTRAMUSCULAR | Status: AC
  Filled 2019-04-19: qty 1

## 2019-04-19 MED ORDER — HEPARIN SOD (PORK) LOCK FLUSH 100 UNIT/ML IV SOLN
500.0000 [IU] | Freq: Once | INTRAVENOUS | Status: AC | PRN
  Administered 2019-04-19: 500 [IU]
  Filled 2019-04-19: qty 5

## 2019-04-19 MED ORDER — SODIUM CHLORIDE 0.9 % IV SOLN
Freq: Once | INTRAVENOUS | Status: AC
  Administered 2019-04-19: 15:00:00 via INTRAVENOUS
  Filled 2019-04-19: qty 250

## 2019-04-19 MED ORDER — DEXAMETHASONE SODIUM PHOSPHATE 10 MG/ML IJ SOLN
10.0000 mg | Freq: Once | INTRAMUSCULAR | Status: AC
  Administered 2019-04-19: 10 mg via INTRAVENOUS

## 2019-04-19 MED ORDER — SODIUM CHLORIDE 0.9% FLUSH
10.0000 mL | INTRAVENOUS | Status: DC | PRN
  Administered 2019-04-19: 10 mL
  Filled 2019-04-19: qty 10

## 2019-04-19 NOTE — Patient Instructions (Signed)
[facility] Discharge Instructions for Patients Receiving Chemotherapy  Today you received the following chemotherapy agents:  Etoposide.  To help prevent nausea and vomiting after your treatment, we encourage you to take your nausea medication as directed.   If you develop nausea and vomiting that is not controlled by your nausea medication, call the clinic.   BELOW ARE SYMPTOMS THAT SHOULD BE REPORTED IMMEDIATELY:  *FEVER GREATER THAN 100.5 F  *CHILLS WITH OR WITHOUT FEVER  NAUSEA AND VOMITING THAT IS NOT CONTROLLED WITH YOUR NAUSEA MEDICATION  *UNUSUAL SHORTNESS OF BREATH  *UNUSUAL BRUISING OR BLEEDING  TENDERNESS IN MOUTH AND THROAT WITH OR WITHOUT PRESENCE OF ULCERS  *URINARY PROBLEMS  *BOWEL PROBLEMS  UNUSUAL RASH Items with * indicate a potential emergency and should be followed up as soon as possible.  Feel free to call the clinic you have any questions or concerns. The clinic phone number is [phone].  Please show the [place] at check-in to the Emergency Department and triage nurse.  Coronavirus (COVID-19) Are you at risk?  Are you at risk for the Coronavirus (COVID-19)?  To be considered HIGH RISK for Coronavirus (COVID-19), you have to meet the following criteria:  . Traveled to [place], [place], [place], [place] or [place]; or in the [place] to [place], [place], [place], or [place]; and have fever, cough, and shortness of breath within the last 2 weeks of travel OR . Been in close contact with a person diagnosed with COVID-19 within the last 2 weeks and have fever, cough, and shortness of breath . IF YOU DO NOT MEET THESE CRITERIA, YOU ARE CONSIDERED LOW RISK FOR COVID-19.  What to do if you are HIGH RISK for COVID-19?  Marland Kitchen If you are having a medical emergency, call 911. . Seek medical care right away. Before you go to a doctor's office, urgent care or emergency department, call ahead and tell them about your recent  travel, contact with someone diagnosed with COVID-19, and your symptoms. You should receive instructions from your physician's office regarding next steps of care.  . When you arrive at healthcare provider, tell the healthcare staff immediately you have returned from visiting [place], [place], [place], [place] or [place]; or traveled in the [place] to [place], [place], [place], or [place]; in the last two weeks or you have been in close contact with a person diagnosed with COVID-19 in the last 2 weeks.   . Tell the health care staff about your symptoms: fever, cough and shortness of breath. . After you have been seen by a medical provider, you will be either: o Tested for (COVID-19) and discharged home on quarantine except to seek medical care if symptoms worsen, and asked to  - Stay home and avoid contact with others until you get your results (4-5 days)  - Avoid travel on public transportation if possible (such as bus, train, or airplane) or o Sent to the Emergency Department by EMS for evaluation, COVID-19 testing, and possible admission depending on your condition and test results.  What to do if you are LOW RISK for COVID-19?  Reduce your risk of any infection by using the same precautions used for avoiding the common cold or flu:  Marland Kitchen Wash your hands often with soap and warm water for at least 20 seconds.  If soap and water are not readily available, use an alcohol-based hand sanitizer with at least 60% alcohol.  . If coughing or sneezing,  cover your mouth and nose by coughing or sneezing into the elbow areas of your shirt or coat, into a tissue or into your sleeve (not your hands). . Avoid shaking hands with others and consider head nods or verbal greetings only. . Avoid touching your eyes, nose, or mouth with unwashed hands.  . Avoid close contact with people who are sick. . Avoid places or events with large numbers of people in one location, like concerts or sporting  events. . Carefully consider travel plans you have or are making. . If you are planning any travel outside or inside the [place], visit the CDC's Travelers' Health webpage for the latest health notices. . If you have some symptoms but not all symptoms, continue to monitor at home and seek medical attention if your symptoms worsen. . If you are having a medical emergency, call 911.   [place] / e-Visit: [email or web address]         [facility]: [facility]: [phone]                   [facility]: [phone]

## 2019-04-20 ENCOUNTER — Inpatient Hospital Stay: Payer: Medicare Other

## 2019-04-20 ENCOUNTER — Other Ambulatory Visit: Payer: Self-pay

## 2019-04-20 VITALS — BP 131/83 | HR 98 | Temp 97.9°F | Resp 18

## 2019-04-20 DIAGNOSIS — J449 Chronic obstructive pulmonary disease, unspecified: Secondary | ICD-10-CM | POA: Diagnosis not present

## 2019-04-20 DIAGNOSIS — G4700 Insomnia, unspecified: Secondary | ICD-10-CM | POA: Diagnosis not present

## 2019-04-20 DIAGNOSIS — C3411 Malignant neoplasm of upper lobe, right bronchus or lung: Secondary | ICD-10-CM | POA: Diagnosis not present

## 2019-04-20 DIAGNOSIS — Z5112 Encounter for antineoplastic immunotherapy: Secondary | ICD-10-CM | POA: Diagnosis not present

## 2019-04-20 DIAGNOSIS — Z5111 Encounter for antineoplastic chemotherapy: Secondary | ICD-10-CM | POA: Diagnosis not present

## 2019-04-20 DIAGNOSIS — C3491 Malignant neoplasm of unspecified part of right bronchus or lung: Secondary | ICD-10-CM

## 2019-04-20 DIAGNOSIS — R918 Other nonspecific abnormal finding of lung field: Secondary | ICD-10-CM | POA: Diagnosis not present

## 2019-04-20 MED ORDER — PEGFILGRASTIM 6 MG/0.6ML ~~LOC~~ PSKT
6.0000 mg | PREFILLED_SYRINGE | Freq: Once | SUBCUTANEOUS | Status: AC
  Administered 2019-04-20: 6 mg via SUBCUTANEOUS

## 2019-04-20 MED ORDER — SODIUM CHLORIDE 0.9 % IV SOLN
92.0000 mg/m2 | Freq: Once | INTRAVENOUS | Status: AC
  Administered 2019-04-20: 200 mg via INTRAVENOUS

## 2019-04-20 MED ORDER — DEXAMETHASONE SODIUM PHOSPHATE 10 MG/ML IJ SOLN
INTRAMUSCULAR | Status: AC
  Filled 2019-04-20: qty 1

## 2019-04-20 MED ORDER — DEXAMETHASONE SODIUM PHOSPHATE 10 MG/ML IJ SOLN
10.0000 mg | Freq: Once | INTRAMUSCULAR | Status: AC
  Administered 2019-04-20: 10 mg via INTRAVENOUS

## 2019-04-20 MED ORDER — SODIUM CHLORIDE 0.9% FLUSH
10.0000 mL | INTRAVENOUS | Status: DC | PRN
  Administered 2019-04-20: 10 mL
  Filled 2019-04-20: qty 10

## 2019-04-20 MED ORDER — PEGFILGRASTIM 6 MG/0.6ML ~~LOC~~ PSKT
PREFILLED_SYRINGE | SUBCUTANEOUS | Status: AC
  Filled 2019-04-20: qty 0.6

## 2019-04-20 MED ORDER — SODIUM CHLORIDE 0.9 % IV SOLN
Freq: Once | INTRAVENOUS | Status: AC
  Administered 2019-04-20: 09:00:00 via INTRAVENOUS
  Filled 2019-04-20: qty 250

## 2019-04-20 MED ORDER — HEPARIN SOD (PORK) LOCK FLUSH 100 UNIT/ML IV SOLN
500.0000 [IU] | Freq: Once | INTRAVENOUS | Status: AC | PRN
  Administered 2019-04-20: 11:00:00 500 [IU]
  Filled 2019-04-20: qty 5

## 2019-04-20 NOTE — Patient Instructions (Signed)
[facility] Discharge Instructions for Patients Receiving Chemotherapy  Today you received the following chemotherapy agents:  Etoposide.  To help prevent nausea and vomiting after your treatment, we encourage you to take your nausea medication as directed.   If you develop nausea and vomiting that is not controlled by your nausea medication, call the clinic.   BELOW ARE SYMPTOMS THAT SHOULD BE REPORTED IMMEDIATELY:  *FEVER GREATER THAN 100.5 F  *CHILLS WITH OR WITHOUT FEVER  NAUSEA AND VOMITING THAT IS NOT CONTROLLED WITH YOUR NAUSEA MEDICATION  *UNUSUAL SHORTNESS OF BREATH  *UNUSUAL BRUISING OR BLEEDING  TENDERNESS IN MOUTH AND THROAT WITH OR WITHOUT PRESENCE OF ULCERS  *URINARY PROBLEMS  *BOWEL PROBLEMS  UNUSUAL RASH Items with * indicate a potential emergency and should be followed up as soon as possible.  Feel free to call the clinic you have any questions or concerns. The clinic phone number is [phone].  Please show the [facility] at check-in to the Emergency Department and triage nurse.  Coronavirus (COVID-19) Are you at risk?  Are you at risk for the Coronavirus (COVID-19)?  To be considered HIGH RISK for Coronavirus (COVID-19), you have to meet the following criteria:  . Traveled to [place], [place], [place], [place] or [place]; or in the [place] to [place], [place], [place], or [place]; and have fever, cough, and shortness of breath within the last 2 weeks of travel OR . Been in close contact with a person diagnosed with COVID-19 within the last 2 weeks and have fever, cough, and shortness of breath . IF YOU DO NOT MEET THESE CRITERIA, YOU ARE CONSIDERED LOW RISK FOR COVID-19.  What to do if you are HIGH RISK for COVID-19?  Marland Kitchen If you are having a medical emergency, call 911. . Seek medical care right away. Before you go to a doctor's office, urgent care or emergency department, call ahead and tell them about your recent  travel, contact with someone diagnosed with COVID-19, and your symptoms. You should receive instructions from your physician's office regarding next steps of care.  . When you arrive at healthcare provider, tell the healthcare staff immediately you have returned from visiting [place], [place], [place], [place] or [place]; or traveled in the [place] to [place], [place], [place], or [place]; in the last two weeks or you have been in close contact with a person diagnosed with COVID-19 in the last 2 weeks.   . Tell the health care staff about your symptoms: fever, cough and shortness of breath. . After you have been seen by a medical provider, you will be either: o Tested for (COVID-19) and discharged home on quarantine except to seek medical care if symptoms worsen, and asked to  - Stay home and avoid contact with others until you get your results (4-5 days)  - Avoid travel on public transportation if possible (such as bus, train, or airplane) or o Sent to the Emergency Department by EMS for evaluation, COVID-19 testing, and possible admission depending on your condition and test results.  What to do if you are LOW RISK for COVID-19?  Reduce your risk of any infection by using the same precautions used for avoiding the common cold or flu:  Marland Kitchen Wash your hands often with soap and warm water for at least 20 seconds.  If soap and water are not readily available, use an alcohol-based hand sanitizer with at least 60% alcohol.  . If coughing or sneezing,  cover your mouth and nose by coughing or sneezing into the elbow areas of your shirt or coat, into a tissue or into your sleeve (not your hands). . Avoid shaking hands with others and consider head nods or verbal greetings only. . Avoid touching your eyes, nose, or mouth with unwashed hands.  . Avoid close contact with people who are sick. . Avoid places or events with large numbers of people in one location, like concerts or sporting  events. . Carefully consider travel plans you have or are making. . If you are planning any travel outside or inside the [place], visit the CDC's Travelers' Health webpage for the latest health notices. . If you have some symptoms but not all symptoms, continue to monitor at home and seek medical attention if your symptoms worsen. . If you are having a medical emergency, call 911.   [facility] / e-Visit: [email or web address]         [facility]: [facility]: [phone]                   [facility]: [phone]

## 2019-04-22 ENCOUNTER — Other Ambulatory Visit: Payer: Medicare Other

## 2019-04-22 ENCOUNTER — Inpatient Hospital Stay: Payer: Medicare Other | Admitting: Physician Assistant

## 2019-04-22 ENCOUNTER — Inpatient Hospital Stay: Payer: Medicare Other

## 2019-04-23 ENCOUNTER — Ambulatory Visit: Payer: Medicare Other

## 2019-04-24 ENCOUNTER — Other Ambulatory Visit: Payer: Self-pay

## 2019-04-24 ENCOUNTER — Inpatient Hospital Stay: Payer: Medicare Other

## 2019-04-24 ENCOUNTER — Ambulatory Visit: Payer: Medicare Other

## 2019-04-24 DIAGNOSIS — J449 Chronic obstructive pulmonary disease, unspecified: Secondary | ICD-10-CM | POA: Diagnosis not present

## 2019-04-24 DIAGNOSIS — Z5111 Encounter for antineoplastic chemotherapy: Secondary | ICD-10-CM | POA: Diagnosis not present

## 2019-04-24 DIAGNOSIS — C3491 Malignant neoplasm of unspecified part of right bronchus or lung: Secondary | ICD-10-CM

## 2019-04-24 DIAGNOSIS — G4700 Insomnia, unspecified: Secondary | ICD-10-CM | POA: Diagnosis not present

## 2019-04-24 DIAGNOSIS — R918 Other nonspecific abnormal finding of lung field: Secondary | ICD-10-CM | POA: Diagnosis not present

## 2019-04-24 DIAGNOSIS — R5382 Chronic fatigue, unspecified: Secondary | ICD-10-CM

## 2019-04-24 DIAGNOSIS — Z95828 Presence of other vascular implants and grafts: Secondary | ICD-10-CM | POA: Insufficient documentation

## 2019-04-24 DIAGNOSIS — C3411 Malignant neoplasm of upper lobe, right bronchus or lung: Secondary | ICD-10-CM | POA: Diagnosis not present

## 2019-04-24 DIAGNOSIS — Z5112 Encounter for antineoplastic immunotherapy: Secondary | ICD-10-CM | POA: Diagnosis not present

## 2019-04-24 LAB — CBC WITH DIFFERENTIAL (CANCER CENTER ONLY)
Abs Immature Granulocytes: 0.07 10*3/uL (ref 0.00–0.07)
Basophils Absolute: 0 10*3/uL (ref 0.0–0.1)
Basophils Relative: 1 %
Eosinophils Absolute: 0 10*3/uL (ref 0.0–0.5)
Eosinophils Relative: 0 %
HCT: 30.4 % — ABNORMAL LOW (ref 36.0–46.0)
Hemoglobin: 10.3 g/dL — ABNORMAL LOW (ref 12.0–15.0)
Immature Granulocytes: 1 %
Lymphocytes Relative: 12 %
Lymphs Abs: 0.8 10*3/uL (ref 0.7–4.0)
MCH: 30.4 pg (ref 26.0–34.0)
MCHC: 33.9 g/dL (ref 30.0–36.0)
MCV: 89.7 fL (ref 80.0–100.0)
Monocytes Absolute: 0 10*3/uL — ABNORMAL LOW (ref 0.1–1.0)
Monocytes Relative: 1 %
Neutro Abs: 5.5 10*3/uL (ref 1.7–7.7)
Neutrophils Relative %: 85 %
Platelet Count: 271 10*3/uL (ref 150–400)
RBC: 3.39 MIL/uL — ABNORMAL LOW (ref 3.87–5.11)
RDW: 14.6 % (ref 11.5–15.5)
WBC Count: 6.4 10*3/uL (ref 4.0–10.5)
nRBC: 0 % (ref 0.0–0.2)

## 2019-04-24 LAB — CMP (CANCER CENTER ONLY)
ALT: 9 U/L (ref 0–44)
AST: 12 U/L — ABNORMAL LOW (ref 15–41)
Albumin: 3.2 g/dL — ABNORMAL LOW (ref 3.5–5.0)
Alkaline Phosphatase: 94 U/L (ref 38–126)
Anion gap: 9 (ref 5–15)
BUN: 19 mg/dL (ref 8–23)
CO2: 27 mmol/L (ref 22–32)
Calcium: 8.5 mg/dL — ABNORMAL LOW (ref 8.9–10.3)
Chloride: 102 mmol/L (ref 98–111)
Creatinine: 0.86 mg/dL (ref 0.44–1.00)
GFR, Est AFR Am: >60 mL/min (ref >60)
GFR, Estimated: >60 mL/min (ref >60)
Glucose, Bld: 77 mg/dL (ref 70–99)
Potassium: 3.5 mmol/L (ref 3.5–5.1)
Sodium: 138 mmol/L (ref 135–145)
Total Bilirubin: 0.4 mg/dL (ref 0.3–1.2)
Total Protein: 6.1 g/dL — ABNORMAL LOW (ref 6.5–8.1)

## 2019-04-24 LAB — TSH: TSH: 2.147 u[IU]/mL (ref 0.308–3.960)

## 2019-04-24 MED ORDER — SODIUM CHLORIDE 0.9% FLUSH
10.0000 mL | Freq: Once | INTRAVENOUS | Status: AC
  Administered 2019-04-24: 10 mL
  Filled 2019-04-24: qty 10

## 2019-04-24 MED ORDER — HEPARIN SOD (PORK) LOCK FLUSH 100 UNIT/ML IV SOLN
500.0000 [IU] | Freq: Once | INTRAVENOUS | Status: AC
  Administered 2019-04-24: 500 [IU]
  Filled 2019-04-24: qty 5

## 2019-04-26 ENCOUNTER — Ambulatory Visit: Payer: Medicare Other

## 2019-05-01 ENCOUNTER — Inpatient Hospital Stay: Payer: Medicare Other

## 2019-05-01 ENCOUNTER — Other Ambulatory Visit: Payer: Self-pay

## 2019-05-01 DIAGNOSIS — Z5111 Encounter for antineoplastic chemotherapy: Secondary | ICD-10-CM | POA: Diagnosis not present

## 2019-05-01 DIAGNOSIS — C3491 Malignant neoplasm of unspecified part of right bronchus or lung: Secondary | ICD-10-CM

## 2019-05-01 DIAGNOSIS — J449 Chronic obstructive pulmonary disease, unspecified: Secondary | ICD-10-CM | POA: Diagnosis not present

## 2019-05-01 DIAGNOSIS — C3411 Malignant neoplasm of upper lobe, right bronchus or lung: Secondary | ICD-10-CM | POA: Diagnosis not present

## 2019-05-01 DIAGNOSIS — G4700 Insomnia, unspecified: Secondary | ICD-10-CM | POA: Diagnosis not present

## 2019-05-01 DIAGNOSIS — Z95828 Presence of other vascular implants and grafts: Secondary | ICD-10-CM

## 2019-05-01 DIAGNOSIS — R918 Other nonspecific abnormal finding of lung field: Secondary | ICD-10-CM | POA: Diagnosis not present

## 2019-05-01 DIAGNOSIS — Z5112 Encounter for antineoplastic immunotherapy: Secondary | ICD-10-CM | POA: Diagnosis not present

## 2019-05-01 LAB — CMP (CANCER CENTER ONLY)
ALT: 9 U/L (ref 0–44)
AST: 9 U/L — ABNORMAL LOW (ref 15–41)
Albumin: 3.3 g/dL — ABNORMAL LOW (ref 3.5–5.0)
Alkaline Phosphatase: 100 U/L (ref 38–126)
Anion gap: 11 (ref 5–15)
BUN: 15 mg/dL (ref 8–23)
CO2: 24 mmol/L (ref 22–32)
Calcium: 8.8 mg/dL — ABNORMAL LOW (ref 8.9–10.3)
Chloride: 109 mmol/L (ref 98–111)
Creatinine: 0.94 mg/dL (ref 0.44–1.00)
GFR, Est AFR Am: >60 mL/min (ref >60)
GFR, Estimated: >60 mL/min (ref >60)
Glucose, Bld: 86 mg/dL (ref 70–99)
Potassium: 3.4 mmol/L — ABNORMAL LOW (ref 3.5–5.1)
Sodium: 144 mmol/L (ref 135–145)
Total Bilirubin: 0.2 mg/dL — ABNORMAL LOW (ref 0.3–1.2)
Total Protein: 6.4 g/dL — ABNORMAL LOW (ref 6.5–8.1)

## 2019-05-01 LAB — CBC WITH DIFFERENTIAL (CANCER CENTER ONLY)
Abs Immature Granulocytes: 0 10*3/uL (ref 0.00–0.07)
Basophils Absolute: 0 10*3/uL (ref 0.0–0.1)
Basophils Relative: 1 %
Eosinophils Absolute: 0 10*3/uL (ref 0.0–0.5)
Eosinophils Relative: 3 %
HCT: 25.8 % — ABNORMAL LOW (ref 36.0–46.0)
Hemoglobin: 8.6 g/dL — ABNORMAL LOW (ref 12.0–15.0)
Immature Granulocytes: 0 %
Lymphocytes Relative: 39 %
Lymphs Abs: 0.6 10*3/uL — ABNORMAL LOW (ref 0.7–4.0)
MCH: 30.8 pg (ref 26.0–34.0)
MCHC: 33.3 g/dL (ref 30.0–36.0)
MCV: 92.5 fL (ref 80.0–100.0)
Monocytes Absolute: 0.1 10*3/uL (ref 0.1–1.0)
Monocytes Relative: 10 %
Neutro Abs: 0.7 10*3/uL — ABNORMAL LOW (ref 1.7–7.7)
Neutrophils Relative %: 47 %
Platelet Count: 41 10*3/uL — ABNORMAL LOW (ref 150–400)
RBC: 2.79 MIL/uL — ABNORMAL LOW (ref 3.87–5.11)
RDW: 14.8 % (ref 11.5–15.5)
WBC Count: 1.5 10*3/uL — ABNORMAL LOW (ref 4.0–10.5)
nRBC: 0 % (ref 0.0–0.2)

## 2019-05-01 MED ORDER — SODIUM CHLORIDE 0.9% FLUSH
10.0000 mL | Freq: Once | INTRAVENOUS | Status: AC
  Administered 2019-05-01: 10 mL
  Filled 2019-05-01: qty 10

## 2019-05-01 MED ORDER — HEPARIN SOD (PORK) LOCK FLUSH 100 UNIT/ML IV SOLN
500.0000 [IU] | Freq: Once | INTRAVENOUS | Status: AC
  Administered 2019-05-01: 13:00:00 500 [IU]
  Filled 2019-05-01: qty 5

## 2019-05-07 ENCOUNTER — Telehealth: Payer: Self-pay | Admitting: Internal Medicine

## 2019-05-07 ENCOUNTER — Other Ambulatory Visit: Payer: Self-pay

## 2019-05-07 ENCOUNTER — Encounter: Payer: Self-pay | Admitting: Internal Medicine

## 2019-05-07 ENCOUNTER — Inpatient Hospital Stay: Payer: Medicare Other

## 2019-05-07 ENCOUNTER — Inpatient Hospital Stay (HOSPITAL_BASED_OUTPATIENT_CLINIC_OR_DEPARTMENT_OTHER): Payer: Medicare Other | Admitting: Internal Medicine

## 2019-05-07 ENCOUNTER — Other Ambulatory Visit: Payer: Self-pay | Admitting: Internal Medicine

## 2019-05-07 VITALS — HR 100

## 2019-05-07 VITALS — BP 108/84 | HR 104 | Temp 98.5°F | Resp 18 | Ht 66.0 in | Wt 216.7 lb

## 2019-05-07 DIAGNOSIS — Z79899 Other long term (current) drug therapy: Secondary | ICD-10-CM

## 2019-05-07 DIAGNOSIS — C3491 Malignant neoplasm of unspecified part of right bronchus or lung: Secondary | ICD-10-CM

## 2019-05-07 DIAGNOSIS — M1990 Unspecified osteoarthritis, unspecified site: Secondary | ICD-10-CM | POA: Diagnosis not present

## 2019-05-07 DIAGNOSIS — Z791 Long term (current) use of non-steroidal anti-inflammatories (NSAID): Secondary | ICD-10-CM | POA: Diagnosis not present

## 2019-05-07 DIAGNOSIS — C3411 Malignant neoplasm of upper lobe, right bronchus or lung: Secondary | ICD-10-CM | POA: Diagnosis not present

## 2019-05-07 DIAGNOSIS — Z7951 Long term (current) use of inhaled steroids: Secondary | ICD-10-CM | POA: Diagnosis not present

## 2019-05-07 DIAGNOSIS — J449 Chronic obstructive pulmonary disease, unspecified: Secondary | ICD-10-CM

## 2019-05-07 DIAGNOSIS — Z5112 Encounter for antineoplastic immunotherapy: Secondary | ICD-10-CM | POA: Diagnosis not present

## 2019-05-07 DIAGNOSIS — R918 Other nonspecific abnormal finding of lung field: Secondary | ICD-10-CM

## 2019-05-07 DIAGNOSIS — Z5111 Encounter for antineoplastic chemotherapy: Secondary | ICD-10-CM

## 2019-05-07 DIAGNOSIS — G4700 Insomnia, unspecified: Secondary | ICD-10-CM | POA: Diagnosis not present

## 2019-05-07 DIAGNOSIS — Z95828 Presence of other vascular implants and grafts: Secondary | ICD-10-CM

## 2019-05-07 LAB — CBC WITH DIFFERENTIAL (CANCER CENTER ONLY)
Abs Immature Granulocytes: 0.02 10*3/uL (ref 0.00–0.07)
Basophils Absolute: 0 10*3/uL (ref 0.0–0.1)
Basophils Relative: 1 %
Eosinophils Absolute: 0.1 10*3/uL (ref 0.0–0.5)
Eosinophils Relative: 3 %
HCT: 29.5 % — ABNORMAL LOW (ref 36.0–46.0)
Hemoglobin: 9.7 g/dL — ABNORMAL LOW (ref 12.0–15.0)
Immature Granulocytes: 1 %
Lymphocytes Relative: 29 %
Lymphs Abs: 0.7 10*3/uL (ref 0.7–4.0)
MCH: 31.1 pg (ref 26.0–34.0)
MCHC: 32.9 g/dL (ref 30.0–36.0)
MCV: 94.6 fL (ref 80.0–100.0)
Monocytes Absolute: 0.5 10*3/uL (ref 0.1–1.0)
Monocytes Relative: 19 %
Neutro Abs: 1.2 10*3/uL — ABNORMAL LOW (ref 1.7–7.7)
Neutrophils Relative %: 47 %
Platelet Count: 154 10*3/uL (ref 150–400)
RBC: 3.12 MIL/uL — ABNORMAL LOW (ref 3.87–5.11)
RDW: 18.4 % — ABNORMAL HIGH (ref 11.5–15.5)
WBC Count: 2.5 10*3/uL — ABNORMAL LOW (ref 4.0–10.5)
nRBC: 0 % (ref 0.0–0.2)

## 2019-05-07 LAB — CMP (CANCER CENTER ONLY)
ALT: 9 U/L (ref 0–44)
AST: 10 U/L — ABNORMAL LOW (ref 15–41)
Albumin: 3.4 g/dL — ABNORMAL LOW (ref 3.5–5.0)
Alkaline Phosphatase: 101 U/L (ref 38–126)
Anion gap: 11 (ref 5–15)
BUN: 17 mg/dL (ref 8–23)
CO2: 26 mmol/L (ref 22–32)
Calcium: 9.1 mg/dL (ref 8.9–10.3)
Chloride: 107 mmol/L (ref 98–111)
Creatinine: 1.12 mg/dL — ABNORMAL HIGH (ref 0.44–1.00)
GFR, Est AFR Am: 59 mL/min — ABNORMAL LOW (ref >60)
GFR, Estimated: 51 mL/min — ABNORMAL LOW (ref >60)
Glucose, Bld: 94 mg/dL (ref 70–99)
Potassium: 3.5 mmol/L (ref 3.5–5.1)
Sodium: 144 mmol/L (ref 135–145)
Total Bilirubin: 0.2 mg/dL — ABNORMAL LOW (ref 0.3–1.2)
Total Protein: 6.4 g/dL — ABNORMAL LOW (ref 6.5–8.1)

## 2019-05-07 MED ORDER — SODIUM CHLORIDE 0.9 % IV SOLN
1200.0000 mg | Freq: Once | INTRAVENOUS | Status: AC
  Administered 2019-05-07: 13:00:00 1200 mg via INTRAVENOUS
  Filled 2019-05-07: qty 20

## 2019-05-07 MED ORDER — PALONOSETRON HCL INJECTION 0.25 MG/5ML
0.2500 mg | Freq: Once | INTRAVENOUS | Status: AC
  Administered 2019-05-07: 12:00:00 0.25 mg via INTRAVENOUS

## 2019-05-07 MED ORDER — SODIUM CHLORIDE 0.9 % IV SOLN
92.0000 mg/m2 | Freq: Once | INTRAVENOUS | Status: AC
  Administered 2019-05-07: 13:00:00 200 mg via INTRAVENOUS
  Filled 2019-05-07: qty 10

## 2019-05-07 MED ORDER — SODIUM CHLORIDE 0.9 % IV SOLN
Freq: Once | INTRAVENOUS | Status: AC
  Administered 2019-05-07: 11:00:00 via INTRAVENOUS
  Filled 2019-05-07: qty 250

## 2019-05-07 MED ORDER — SODIUM CHLORIDE 0.9 % IV SOLN
Freq: Once | INTRAVENOUS | Status: AC
  Administered 2019-05-07: 12:00:00 via INTRAVENOUS
  Filled 2019-05-07: qty 5

## 2019-05-07 MED ORDER — HEPARIN SOD (PORK) LOCK FLUSH 100 UNIT/ML IV SOLN
500.0000 [IU] | Freq: Once | INTRAVENOUS | Status: AC | PRN
  Administered 2019-05-07: 15:00:00 500 [IU]
  Filled 2019-05-07: qty 5

## 2019-05-07 MED ORDER — SODIUM CHLORIDE 0.9% FLUSH
10.0000 mL | INTRAVENOUS | Status: DC | PRN
  Administered 2019-05-07: 10 mL
  Filled 2019-05-07: qty 10

## 2019-05-07 MED ORDER — PALONOSETRON HCL INJECTION 0.25 MG/5ML
INTRAVENOUS | Status: AC
  Filled 2019-05-07: qty 5

## 2019-05-07 MED ORDER — SODIUM CHLORIDE 0.9% FLUSH
10.0000 mL | Freq: Once | INTRAVENOUS | Status: AC
  Administered 2019-05-07: 10 mL
  Filled 2019-05-07: qty 10

## 2019-05-07 MED ORDER — SODIUM CHLORIDE 0.9 % IV SOLN
516.0000 mg | Freq: Once | INTRAVENOUS | Status: AC
  Administered 2019-05-07: 520 mg via INTRAVENOUS
  Filled 2019-05-07: qty 52

## 2019-05-07 NOTE — Progress Notes (Signed)
[facility] Telephone:([phone]) [phone]   Fax:(336) [place], MD [address] [place] [address]  DIAGNOSIS: Recurrent small cell lung cancer initially diagnosed as Limited stage (T2b, N2, M0) small cell lung cancer diagnosed in November 2017 and presented with large right hilar mass with mediastinal invasion and a right lower lobe pulmonary nodule.  The patient had disease recurrence in the right lower lobe and mediastinal lymph nodes in February 2020.  PRIOR THERAPY:   1) Systemic chemotherapy with cisplatin 60 MG/M2 on day 1 and etoposide 120 MG/M2 on days 1, 2 and 3 concurrent with radiation status post 6 cycles.  Last dose was giving March 06, 2017. 2) status post prophylactic cranial irradiation.  CURRENT THERAPY: Systemic chemotherapy with carboplatin for AUC of 5 on day 1, etoposide.  100 mg/M2 on days 1, 2 and 3 as well as Tecentriq 1200 mg IV with Neulasta support every 3 weeks.  First dose March 05, 2019.  Status post 3 cycles.  INTERVAL HISTORY: Tammy Bowers 68 y.o. female returns to the clinic today for follow-up visit.  Her husband was available by phone during the visit.  The patient is feeling fine today with no concerning complaints except for fatigue and generalized weakness.  She has no chest pain, shortness of breath, cough or hemoptysis.  She has no fever or chills.  She has no significant weight loss or night sweats.  She has no nausea or vomiting.  She is here today for evaluation before starting cycle #4 of her treatment.  MEDICAL HISTORY: Past Medical History:  Diagnosis Date   Adenopathy 10/10/2016   PERICARINAL   Anemia    Arthritis    Asthma    Cancer ([place])    lung cancer Small Cell   COPD (chronic obstructive pulmonary disease) ([facility])    Dyspnea    Headache    migraines   Hilar mass 10/10/2016   RIGHT   History of kidney stones    Insomnia    Lung nodules  10/10/2016   RIGHT LOWER LOBE   Mass of both adrenal glands ([facility]) 10/10/2016   Mood swing    Pneumonia    Spinal headache    Substance abuse ([place])    TOBACCO   UNSPECIFIED INFECTION OF BONE ANKLE AND FOOT 10/22/2009   Annotation: left ankle Qualifier: Diagnosis of  By: Patsy Baltimore RN, Denise      ALLERGIES:  is allergic to lidocaine.  MEDICATIONS:  Current Outpatient Medications  Medication Sig Dispense Refill   albuterol (PROVENTIL HFA;VENTOLIN HFA) 108 (90 BASE) MCG/ACT inhaler Inhale 2 puffs into the lungs every 4 (four) hours as needed for wheezing.     Cyclobenzaprine HCl (FLEXERIL PO) Take 5 mg by mouth 3 (three) times daily as needed (muscle spasms).      diphenoxylate-atropine (LOMOTIL) 2.5-0.025 MG tablet Take 1 tablet by mouth 4 (four) times daily as needed for diarrhea or loose stools. 30 tablet 0   Fluticasone-Salmeterol (ADVAIR) 500-50 MCG/DOSE AEPB Inhale 1 puff into the lungs 2 (two) times daily.     ibuprofen (ADVIL) 800 MG tablet Take 800 mg by mouth every 8 (eight) hours as needed.     LORazepam (ATIVAN) 0.5 MG tablet Take 1 tablet (0.5 mg total) by mouth at bedtime. (Patient taking differently: Take 0.5 mg by mouth at bedtime as needed for anxiety. ) 20 tablet 0   Multiple Vitamin (MULTIVITAMIN WITH MINERALS) TABS tablet  Take 1 tablet by mouth daily.      prochlorperazine (COMPAZINE) 10 MG tablet TAKE 1 TABLET(10 MG) BY MOUTH EVERY 6 HOURS AS NEEDED FOR NAUSEA OR VOMITING 30 tablet 0   sertraline (ZOLOFT) 50 MG tablet Take 50 mg by mouth at bedtime.  5   No current facility-administered medications for this visit.    Facility-Administered Medications Ordered in Other Visits  Medication Dose Route Frequency Provider Last Rate Last Dose   pegfilgrastim-cbqv (UDENYCA) injection 6 mg  6 mg Subcutaneous Once Curt Bears, MD        SURGICAL HISTORY:  Past Surgical History:  Procedure Laterality Date   ANKLE SURGERY     hardware removal, and initial  ankle surgery   APPENDECTOMY     IR IMAGING GUIDED PORT INSERTION  04/12/2019   LUNG BIOPSY N/A 10/19/2016   Procedure: LUNG BIOPSY;  Surgeon: Grace Isaac, MD;  Location: [facility];  Service: Thoracic;  Laterality: N/A;   LUNG BIOPSY N/A 02/20/2019   Procedure: LUNG BIOPSY;  Surgeon: Grace Isaac, MD;  Location: [facility];  Service: Thoracic;  Laterality: N/A;   TONSILLECTOMY     VIDEO BRONCHOSCOPY WITH ENDOBRONCHIAL ULTRASOUND N/A 10/19/2016   Procedure: VIDEO BRONCHOSCOPY WITH ENDOBRONCHIAL ULTRASOUND;  Surgeon: Grace Isaac, MD;  Location: [facility];  Service: Thoracic;  Laterality: N/A;   VIDEO BRONCHOSCOPY WITH ENDOBRONCHIAL ULTRASOUND N/A 02/20/2019   Procedure: VIDEO BRONCHOSCOPY WITH ENDOBRONCHIAL ULTRASOUND;  Surgeon: Grace Isaac, MD;  Location: [facility];  Service: Thoracic;  Laterality: N/A;    REVIEW OF SYSTEMS:  A comprehensive review of systems was negative except for: Constitutional: positive for fatigue Musculoskeletal: positive for muscle weakness   PHYSICAL EXAMINATION: General appearance: alert, cooperative, fatigued and no distress Head: Normocephalic, without obvious abnormality, atraumatic Neck: no adenopathy, no JVD, supple, symmetrical, trachea midline and thyroid not enlarged, symmetric, no tenderness/mass/nodules Lymph nodes: Cervical, supraclavicular, and axillary nodes normal. Resp: clear to auscultation bilaterally Back: symmetric, no curvature. ROM normal. No CVA tenderness. Cardio: regular rate and rhythm, S1, S2 normal, no murmur, click, rub or gallop GI: soft, non-tender; bowel sounds normal; no masses,  no organomegaly Extremities: extremities normal, atraumatic, no cyanosis or edema  ECOG PERFORMANCE STATUS: 1 - Symptomatic but completely ambulatory  Blood pressure 108/84, pulse (!) 104, temperature 98.5 F (36.9 C), temperature source Oral, resp. rate 18, height 5\' 6"  (1.676 m), weight 216 lb 11.2 oz (98.3 kg).  LABORATORY DATA: Lab Results   Component Value Date   WBC 2.5 (L) 05/07/2019   HGB 9.7 (L) 05/07/2019   HCT 29.5 (L) 05/07/2019   MCV 94.6 05/07/2019   PLT 154 05/07/2019      Chemistry      Component Value Date/Time   NA 144 05/01/2019 1312   NA 139 11/24/2017 0913   K 3.4 (L) 05/01/2019 1312   K 3.8 11/24/2017 0913   CL 109 05/01/2019 1312   CO2 24 05/01/2019 1312   CO2 24 11/24/2017 0913   BUN 15 05/01/2019 1312   BUN 22.1 11/24/2017 0913   CREATININE 0.94 05/01/2019 1312   CREATININE 1.2 (H) 11/24/2017 0913      Component Value Date/Time   CALCIUM 8.8 (L) 05/01/2019 1312   CALCIUM 9.7 11/24/2017 0913   ALKPHOS 100 05/01/2019 1312   ALKPHOS 99 11/24/2017 0913   AST 9 (L) 05/01/2019 1312   AST 15 11/24/2017 0913   ALT 9 05/01/2019 1312   ALT 7 11/24/2017 0913   BILITOT 0.2 (  L) 05/01/2019 1312   BILITOT 0.37 11/24/2017 0913       RADIOGRAPHIC STUDIES: Ir Imaging Guided Port Insertion  Result Date: 04/12/2019 INDICATION: 68 year old with small cell lung cancer. Port-A-Cath needed for therapy. EXAM: FLUOROSCOPIC AND ULTRASOUND GUIDED PLACEMENT OF A SUBCUTANEOUS PORT COMPARISON:  None. MEDICATIONS: Ancef 2 g; The antibiotic was administered within an appropriate time interval prior to skin puncture. ANESTHESIA/SEDATION: Versed 4.0 mg IV; Fentanyl 100 mcg IV; Moderate Sedation Time:  33 minutes The patient was continuously monitored during the procedure by the interventional radiology nurse under my direct supervision. FLUOROSCOPY TIME:  12 seconds, 1 mGy COMPLICATIONS: None immediate. PROCEDURE: The procedure, risks, benefits, and alternatives were explained to the patient. Questions regarding the procedure were encouraged and answered. The patient understands and consents to the procedure. Patient was placed supine on the interventional table. Ultrasound confirmed a patent right internal jugular vein. The right chest and neck were cleaned with a skin antiseptic and a sterile drape was placed. Maximal  barrier sterile technique was utilized including caps, mask, sterile gowns, sterile gloves, sterile drape, hand hygiene and skin antiseptic. The right neck was anesthetized with 1% lidocaine. Small incision was made in the right neck with a blade. Micropuncture set was placed in the right internal jugular vein with ultrasound guidance. The micropuncture wire was used for measurement purposes. The right chest was anesthetized with 1% lidocaine with epinephrine. #15 blade was used to make an incision and a subcutaneous port pocket was formed. [place] was assembled. Subcutaneous tunnel was formed with a stiff tunneling device. The port catheter was brought through the subcutaneous tunnel. The port was placed in the subcutaneous pocket and sutured in place. The micropuncture set was exchanged for a peel-away sheath. The catheter was placed through the peel-away sheath and the tip was positioned at the SVC and right atrium junction. Catheter placement was confirmed with fluoroscopy. The port was accessed and flushed with heparinized saline. The port pocket was closed using two layers of absorbable sutures and Dermabond. The vein skin site was closed using a single layer of absorbable suture and Dermabond. Sterile dressings were applied. Patient tolerated the procedure well without an immediate complication. Ultrasound and fluoroscopic images were taken and saved for this procedure. IMPRESSION: Placement of a subcutaneous port device. Electronically Signed   By: Markus Daft M.D.   On: 04/12/2019 12:57    ASSESSMENT AND PLAN:  This is a very pleasant 68 years old white female with recurrent small cell lung cancer that was initially diagnosed as limited stage disease status post a course of systemic chemotherapy with cisplatin and etoposide for 6 cycles concurrent with radiation. She tolerated her treatment well except for fatigue. She is also status post prophylactic cranial irradiation. The patient has  been in observation since March 2018. She was found on recent imaging studies to have evidence for disease recurrence with right lower lobe pulmonary nodules in addition to mediastinal lymphadenopathy. Repeat bronchoscopy with endobronchial ultrasound and biopsy confirmed recurrence of his small cell lung cancer. The patient was started on systemic chemotherapy with carboplatin, etoposide and Tecentriq status post 3 cycles.   She has been tolerating this treatment well except for the fatigue and generalized weakness. I recommended for her to proceed with cycle #4 today. I will see her back for follow-up visit in 3 weeks for evaluation after repeating CT scan of the chest, abdomen and pelvis for restaging of her disease. If her scan showed no concerning findings for  disease progression, she will continue with maintenance therapy with Tecentriq every 3 weeks. The patient was advised to call immediately if she has any other concerning symptoms in the interval. The patient voices understanding of current disease status and treatment options and is in agreement with the current care plan.  All questions were answered. The patient knows to call the clinic with any problems, questions or concerns. We can certainly see the patient much sooner if necessary.  Disclaimer: This note was dictated with voice recognition software. Similar sounding words can inadvertently be transcribed and may not be corrected upon review.

## 2019-05-07 NOTE — Progress Notes (Signed)
Per Dr. Julien Nordmann, okay to treat patient with ANC of 1.2

## 2019-05-07 NOTE — Progress Notes (Signed)
PT requested to stay accessed due to numerous treatments this week.

## 2019-05-07 NOTE — Patient Instructions (Signed)

## 2019-05-07 NOTE — Patient Instructions (Signed)
Steps to Quit Smoking    Smoking tobacco can be bad for your health. It can also affect almost every organ in your body. Smoking puts you and people around you at risk for many serious long-lasting (chronic) diseases. Quitting smoking is hard, but it is one of the best things that you can do for your health. It is never too late to quit.  What are the benefits of quitting smoking?  When you quit smoking, you lower your risk for getting serious diseases and conditions. They can include:  · Lung cancer or lung disease.  · Heart disease.  · Stroke.  · Heart attack.  · Not being able to have children (infertility).  · Weak bones (osteoporosis) and broken bones (fractures).  If you have coughing, wheezing, and shortness of breath, those symptoms may get better when you quit. You may also get sick less often. If you are pregnant, quitting smoking can help to lower your chances of having a baby of low birth weight.  What can I do to help me quit smoking?  Talk with your doctor about what can help you quit smoking. Some things you can do (strategies) include:  · Quitting smoking totally, instead of slowly cutting back how much you smoke over a period of time.  · Going to in-person counseling. You are more likely to quit if you go to many counseling sessions.  · Using resources and support systems, such as:  ? Online chats with a counselor.  ? Phone quitlines.  ? Printed self-help materials.  ? Support groups or group counseling.  ? Text messaging programs.  ? Mobile phone apps or applications.  · Taking medicines. Some of these medicines may have nicotine in them. If you are pregnant or breastfeeding, do not take any medicines to quit smoking unless your doctor says it is okay. Talk with your doctor about counseling or other things that can help you.  Talk with your doctor about using more than one strategy at the same time, such as taking medicines while you are also going to in-person counseling. This can help make  quitting easier.  What things can I do to make it easier to quit?  Quitting smoking might feel very hard at first, but there is a lot that you can do to make it easier. Take these steps:  · Talk to your family and friends. Ask them to support and encourage you.  · Call phone quitlines, reach out to support groups, or work with a counselor.  · Ask people who smoke to not smoke around you.  · Avoid places that make you want (trigger) to smoke, such as:  ? Bars.  ? Parties.  ? Smoke-break areas at work.  · Spend time with people who do not smoke.  · Lower the stress in your life. Stress can make you want to smoke. Try these things to help your stress:  ? Getting regular exercise.  ? Deep-breathing exercises.  ? Yoga.  ? Meditating.  ? Doing a body scan. To do this, close your eyes, focus on one area of your body at a time from head to toe, and notice which parts of your body are tense. Try to relax the muscles in those areas.  · Download or buy apps on your mobile phone or tablet that can help you stick to your quit plan. There are many free apps, such as QuitGuide from the CDC (Centers for Disease Control and Prevention). You can find more   support from smokefree.gov and other websites.  This information is not intended to replace advice given to you by your health care provider. Make sure you discuss any questions you have with your health care provider.  Document Released: 10/01/2009 Document Revised: 08/02/2016 Document Reviewed: 04/21/2015  Elsevier Interactive Patient Education © 2019 [employer]vier Inc.

## 2019-05-07 NOTE — Telephone Encounter (Signed)
Added additional cycle per 5/19 los - pt to get an updated schedule next visit.

## 2019-05-07 NOTE — Patient Instructions (Signed)
[facility] Discharge Instructions for Patients Receiving Chemotherapy  Today you received the following chemotherapy agents Atezolizumab (TECENTRIQ), Etoposide (VEPESID) & Carboplatin (PARAPLATIN).  To help prevent nausea and vomiting after your treatment, we encourage you to take your nausea medication as prescribed.   If you develop nausea and vomiting that is not controlled by your nausea medication, call the clinic.   BELOW ARE SYMPTOMS THAT SHOULD BE REPORTED IMMEDIATELY:  *FEVER GREATER THAN 100.5 F  *CHILLS WITH OR WITHOUT FEVER  NAUSEA AND VOMITING THAT IS NOT CONTROLLED WITH YOUR NAUSEA MEDICATION  *UNUSUAL SHORTNESS OF BREATH  *UNUSUAL BRUISING OR BLEEDING  TENDERNESS IN MOUTH AND THROAT WITH OR WITHOUT PRESENCE OF ULCERS  *URINARY PROBLEMS  *BOWEL PROBLEMS  UNUSUAL RASH Items with * indicate a potential emergency and should be followed up as soon as possible.  Feel free to call the clinic should you have any questions or concerns. The clinic phone number is [phone].  Please show the [facility] at check-in to the Emergency Department and triage nurse.  Coronavirus (COVID-19) Are you at risk?  Are you at risk for the Coronavirus (COVID-19)?  To be considered HIGH RISK for Coronavirus (COVID-19), you have to meet the following criteria:  . Traveled to [place], [place], [place], [place] or [place]; or in the [place] to [place], [place], [place], or [place]; and have fever, cough, and shortness of breath within the last 2 weeks of travel OR . Been in close contact with a person diagnosed with COVID-19 within the last 2 weeks and have fever, cough, and shortness of breath . IF YOU DO NOT MEET THESE CRITERIA, YOU ARE CONSIDERED LOW RISK FOR COVID-19.  What to do if you are HIGH RISK for COVID-19?  Marland Kitchen If you are having a medical emergency, call 911. . Seek medical care right away. Before you go to a doctor's office, urgent care  or emergency department, call ahead and tell them about your recent travel, contact with someone diagnosed with COVID-19, and your symptoms. You should receive instructions from your physician's office regarding next steps of care.  . When you arrive at healthcare provider, tell the healthcare staff immediately you have returned from visiting [place], [place], [place], [place] or [place]; or traveled in the [place] to [place], [place], [place], or [place]; in the last two weeks or you have been in close contact with a person diagnosed with COVID-19 in the last 2 weeks.   . Tell the health care staff about your symptoms: fever, cough and shortness of breath. . After you have been seen by a medical provider, you will be either: o Tested for (COVID-19) and discharged home on quarantine except to seek medical care if symptoms worsen, and asked to  - Stay home and avoid contact with others until you get your results (4-5 days)  - Avoid travel on public transportation if possible (such as bus, train, or airplane) or o Sent to the Emergency Department by EMS for evaluation, COVID-19 testing, and possible admission depending on your condition and test results.  What to do if you are LOW RISK for COVID-19?  Reduce your risk of any infection by using the same precautions used for avoiding the common cold or flu:  Marland Kitchen Wash your hands often with soap and warm water for at least 20 seconds.  If soap and water are not readily available, use an alcohol-based hand sanitizer with at least 60% alcohol.  Marland Kitchen  If coughing or sneezing, cover your mouth and nose by coughing or sneezing into the elbow areas of your shirt or coat, into a tissue or into your sleeve (not your hands). . Avoid shaking hands with others and consider head nods or verbal greetings only. . Avoid touching your eyes, nose, or mouth with unwashed hands.  . Avoid close contact with people who are sick. . Avoid places or events with large  numbers of people in one location, like concerts or sporting events. . Carefully consider travel plans you have or are making. . If you are planning any travel outside or inside the [place], visit the CDC's Travelers' Health webpage for the latest health notices. . If you have some symptoms but not all symptoms, continue to monitor at home and seek medical attention if your symptoms worsen. . If you are having a medical emergency, call 911.   [facility] / e-Visit: [email or web address]         [facility]: [facility]: [phone]                   [facility]: [phone]

## 2019-05-08 ENCOUNTER — Inpatient Hospital Stay: Payer: Medicare Other

## 2019-05-08 ENCOUNTER — Other Ambulatory Visit: Payer: Self-pay

## 2019-05-08 VITALS — BP 142/94 | HR 100 | Temp 98.7°F

## 2019-05-08 DIAGNOSIS — R918 Other nonspecific abnormal finding of lung field: Secondary | ICD-10-CM | POA: Diagnosis not present

## 2019-05-08 DIAGNOSIS — G4700 Insomnia, unspecified: Secondary | ICD-10-CM | POA: Diagnosis not present

## 2019-05-08 DIAGNOSIS — Z5112 Encounter for antineoplastic immunotherapy: Secondary | ICD-10-CM | POA: Diagnosis not present

## 2019-05-08 DIAGNOSIS — Z5111 Encounter for antineoplastic chemotherapy: Secondary | ICD-10-CM | POA: Diagnosis not present

## 2019-05-08 DIAGNOSIS — C3491 Malignant neoplasm of unspecified part of right bronchus or lung: Secondary | ICD-10-CM

## 2019-05-08 DIAGNOSIS — C3411 Malignant neoplasm of upper lobe, right bronchus or lung: Secondary | ICD-10-CM | POA: Diagnosis not present

## 2019-05-08 DIAGNOSIS — J449 Chronic obstructive pulmonary disease, unspecified: Secondary | ICD-10-CM | POA: Diagnosis not present

## 2019-05-08 MED ORDER — SODIUM CHLORIDE 0.9% FLUSH
10.0000 mL | INTRAVENOUS | Status: DC | PRN
  Administered 2019-05-08: 10 mL
  Filled 2019-05-08: qty 10

## 2019-05-08 MED ORDER — HEPARIN SOD (PORK) LOCK FLUSH 100 UNIT/ML IV SOLN
500.0000 [IU] | Freq: Once | INTRAVENOUS | Status: AC | PRN
  Administered 2019-05-08: 500 [IU]
  Filled 2019-05-08: qty 5

## 2019-05-08 MED ORDER — DEXAMETHASONE SODIUM PHOSPHATE 10 MG/ML IJ SOLN
10.0000 mg | Freq: Once | INTRAMUSCULAR | Status: AC
  Administered 2019-05-08: 15:00:00 10 mg via INTRAVENOUS

## 2019-05-08 MED ORDER — DEXAMETHASONE SODIUM PHOSPHATE 10 MG/ML IJ SOLN
INTRAMUSCULAR | Status: AC
  Filled 2019-05-08: qty 1

## 2019-05-08 MED ORDER — SODIUM CHLORIDE 0.9 % IV SOLN
92.0000 mg/m2 | Freq: Once | INTRAVENOUS | Status: AC
  Administered 2019-05-08: 200 mg via INTRAVENOUS
  Filled 2019-05-08: qty 10

## 2019-05-08 MED ORDER — SODIUM CHLORIDE 0.9 % IV SOLN
Freq: Once | INTRAVENOUS | Status: AC
  Administered 2019-05-08: 14:00:00 via INTRAVENOUS
  Filled 2019-05-08: qty 250

## 2019-05-08 NOTE — Progress Notes (Signed)
Per Dr. Julien Nordmann, okay to proceed with treatment today with blood pressure 142/94.

## 2019-05-08 NOTE — Patient Instructions (Signed)
[facility] Discharge Instructions for Patients Receiving Chemotherapy  Today you received the following chemotherapy agents:  Etoposide.  To help prevent nausea and vomiting after your treatment, we encourage you to take your nausea medication as directed.   If you develop nausea and vomiting that is not controlled by your nausea medication, call the clinic.   BELOW ARE SYMPTOMS THAT SHOULD BE REPORTED IMMEDIATELY:  *FEVER GREATER THAN 100.5 F  *CHILLS WITH OR WITHOUT FEVER  NAUSEA AND VOMITING THAT IS NOT CONTROLLED WITH YOUR NAUSEA MEDICATION  *UNUSUAL SHORTNESS OF BREATH  *UNUSUAL BRUISING OR BLEEDING  TENDERNESS IN MOUTH AND THROAT WITH OR WITHOUT PRESENCE OF ULCERS  *URINARY PROBLEMS  *BOWEL PROBLEMS  UNUSUAL RASH Items with * indicate a potential emergency and should be followed up as soon as possible.  Feel free to call the clinic you have any questions or concerns. The clinic phone number is [phone].  Please show the [facility] at check-in to the Emergency Department and triage nurse.  Coronavirus (COVID-19) Are you at risk?  Are you at risk for the Coronavirus (COVID-19)?  To be considered HIGH RISK for Coronavirus (COVID-19), you have to meet the following criteria:  . Traveled to [place], [place], [place], [place] or [place]; or in the [place] to [place], [place], [place], or [place]; and have fever, cough, and shortness of breath within the last 2 weeks of travel OR . Been in close contact with a person diagnosed with COVID-19 within the last 2 weeks and have fever, cough, and shortness of breath . IF YOU DO NOT MEET THESE CRITERIA, YOU ARE CONSIDERED LOW RISK FOR COVID-19.  What to do if you are HIGH RISK for COVID-19?  Marland Kitchen If you are having a medical emergency, call 911. . Seek medical care right away. Before you go to a doctor's office, urgent care or emergency department, call ahead and tell them about your recent  travel, contact with someone diagnosed with COVID-19, and your symptoms. You should receive instructions from your physician's office regarding next steps of care.  . When you arrive at healthcare provider, tell the healthcare staff immediately you have returned from visiting [place], [place], [place], [place] or [place]; or traveled in the [place] to [place], [place], [place], or [place]; in the last two weeks or you have been in close contact with a person diagnosed with COVID-19 in the last 2 weeks.   . Tell the health care staff about your symptoms: fever, cough and shortness of breath. . After you have been seen by a medical provider, you will be either: o Tested for (COVID-19) and discharged home on quarantine except to seek medical care if symptoms worsen, and asked to  - Stay home and avoid contact with others until you get your results (4-5 days)  - Avoid travel on public transportation if possible (such as bus, train, or airplane) or o Sent to the Emergency Department by EMS for evaluation, COVID-19 testing, and possible admission depending on your condition and test results.  What to do if you are LOW RISK for COVID-19?  Reduce your risk of any infection by using the same precautions used for avoiding the common cold or flu:  Marland Kitchen Wash your hands often with soap and warm water for at least 20 seconds.  If soap and water are not readily available, use an alcohol-based hand sanitizer with at least 60% alcohol.  . If coughing or sneezing,  cover your mouth and nose by coughing or sneezing into the elbow areas of your shirt or coat, into a tissue or into your sleeve (not your hands). . Avoid shaking hands with others and consider head nods or verbal greetings only. . Avoid touching your eyes, nose, or mouth with unwashed hands.  . Avoid close contact with people who are sick. . Avoid places or events with large numbers of people in one location, like concerts or sporting  events. . Carefully consider travel plans you have or are making. . If you are planning any travel outside or inside the [place], visit the CDC's Travelers' Health webpage for the latest health notices. . If you have some symptoms but not all symptoms, continue to monitor at home and seek medical attention if your symptoms worsen. . If you are having a medical emergency, call 911.   [place] / e-Visit: [email or web address]         [facility]: [facility]: [phone]                   [facility]: [phone]

## 2019-05-09 ENCOUNTER — Other Ambulatory Visit: Payer: Self-pay

## 2019-05-09 ENCOUNTER — Inpatient Hospital Stay: Payer: Medicare Other

## 2019-05-09 VITALS — BP 118/75 | HR 96 | Temp 98.4°F | Resp 18

## 2019-05-09 DIAGNOSIS — Z5111 Encounter for antineoplastic chemotherapy: Secondary | ICD-10-CM | POA: Diagnosis not present

## 2019-05-09 DIAGNOSIS — R918 Other nonspecific abnormal finding of lung field: Secondary | ICD-10-CM | POA: Diagnosis not present

## 2019-05-09 DIAGNOSIS — Z5112 Encounter for antineoplastic immunotherapy: Secondary | ICD-10-CM | POA: Diagnosis not present

## 2019-05-09 DIAGNOSIS — C3411 Malignant neoplasm of upper lobe, right bronchus or lung: Secondary | ICD-10-CM | POA: Diagnosis not present

## 2019-05-09 DIAGNOSIS — J449 Chronic obstructive pulmonary disease, unspecified: Secondary | ICD-10-CM | POA: Diagnosis not present

## 2019-05-09 DIAGNOSIS — C3491 Malignant neoplasm of unspecified part of right bronchus or lung: Secondary | ICD-10-CM

## 2019-05-09 DIAGNOSIS — G4700 Insomnia, unspecified: Secondary | ICD-10-CM | POA: Diagnosis not present

## 2019-05-09 MED ORDER — PEGFILGRASTIM 6 MG/0.6ML ~~LOC~~ PSKT
6.0000 mg | PREFILLED_SYRINGE | Freq: Once | SUBCUTANEOUS | Status: AC
  Administered 2019-05-09: 6 mg via SUBCUTANEOUS

## 2019-05-09 MED ORDER — SODIUM CHLORIDE 0.9 % IV SOLN
92.0000 mg/m2 | Freq: Once | INTRAVENOUS | Status: AC
  Administered 2019-05-09: 200 mg via INTRAVENOUS
  Filled 2019-05-09: qty 10

## 2019-05-09 MED ORDER — SODIUM CHLORIDE 0.9 % IV SOLN
Freq: Once | INTRAVENOUS | Status: AC
  Administered 2019-05-09: 14:00:00 via INTRAVENOUS
  Filled 2019-05-09: qty 250

## 2019-05-09 MED ORDER — DEXAMETHASONE SODIUM PHOSPHATE 10 MG/ML IJ SOLN
10.0000 mg | Freq: Once | INTRAMUSCULAR | Status: AC
  Administered 2019-05-09: 10 mg via INTRAVENOUS

## 2019-05-09 MED ORDER — HEPARIN SOD (PORK) LOCK FLUSH 100 UNIT/ML IV SOLN
500.0000 [IU] | Freq: Once | INTRAVENOUS | Status: AC | PRN
  Administered 2019-05-09: 500 [IU]
  Filled 2019-05-09: qty 5

## 2019-05-09 MED ORDER — SODIUM CHLORIDE 0.9% FLUSH
10.0000 mL | INTRAVENOUS | Status: DC | PRN
  Administered 2019-05-09: 10 mL
  Filled 2019-05-09: qty 10

## 2019-05-09 MED ORDER — PEGFILGRASTIM 6 MG/0.6ML ~~LOC~~ PSKT
PREFILLED_SYRINGE | SUBCUTANEOUS | Status: AC
  Filled 2019-05-09: qty 0.6

## 2019-05-09 MED ORDER — DEXAMETHASONE SODIUM PHOSPHATE 10 MG/ML IJ SOLN
INTRAMUSCULAR | Status: AC
  Filled 2019-05-09: qty 1

## 2019-05-09 NOTE — Patient Instructions (Signed)
Coronavirus (COVID-19) Are you at risk?  Are you at risk for the Coronavirus (COVID-19)?  To be considered HIGH RISK for Coronavirus (COVID-19), you have to meet the following criteria:  . Traveled to [place], [place], [place], [place] or [place]; or in the [place] to [place], [place], [place], or [place]; and have fever, cough, and shortness of breath within the last 2 weeks of travel OR . Been in close contact with a person diagnosed with COVID-19 within the last 2 weeks and have fever, cough, and shortness of breath . IF YOU DO NOT MEET THESE CRITERIA, YOU ARE CONSIDERED LOW RISK FOR COVID-19.  What to do if you are HIGH RISK for COVID-19?  . If you are having a medical emergency, call 911. . Seek medical care right away. Before you go to a doctor's office, urgent care or emergency department, call ahead and tell them about your recent travel, contact with someone diagnosed with COVID-19, and your symptoms. You should receive instructions from your physician's office regarding next steps of care.  . When you arrive at healthcare provider, tell the healthcare staff immediately you have returned from visiting [place], [place], [place], [place] or [place]; or traveled in the [place] to [place], [place], [place], or [place]; in the last two weeks or you have been in close contact with a person diagnosed with COVID-19 in the last 2 weeks.   . Tell the health care staff about your symptoms: fever, cough and shortness of breath. . After you have been seen by a medical provider, you will be either: o Tested for (COVID-19) and discharged home on quarantine except to seek medical care if symptoms worsen, and asked to  - Stay home and avoid contact with others until you get your results (4-5 days)  - Avoid travel on public transportation if possible (such as bus, train, or airplane) or o Sent to the Emergency Department by EMS for evaluation, COVID-19 testing, and possible  admission depending on your condition and test results.  What to do if you are LOW RISK for COVID-19?  Reduce your risk of any infection by using the same precautions used for avoiding the common cold or flu:  . Wash your hands often with soap and warm water for at least 20 seconds.  If soap and water are not readily available, use an alcohol-based hand sanitizer with at least 60% alcohol.  . If coughing or sneezing, cover your mouth and nose by coughing or sneezing into the elbow areas of your shirt or coat, into a tissue or into your sleeve (not your hands). . Avoid shaking hands with others and consider head nods or verbal greetings only. . Avoid touching your eyes, nose, or mouth with unwashed hands.  . Avoid close contact with people who are sick. . Avoid places or events with large numbers of people in one location, like concerts or sporting events. . Carefully consider travel plans you have or are making. . If you are planning any travel outside or inside the [place], visit the CDC's Travelers' Health webpage for the latest health notices. . If you have some symptoms but not all symptoms, continue to monitor at home and seek medical attention if your symptoms worsen. . If you are having a medical emergency, call 911.   ADDITIONAL HEALTHCARE OPTIONS FOR PATIENTS  [facility] / e-Visit: [email or web address]         [facility]: [phone]  [place]   [facility]: [facility]: [place] Discharge Instructions for Patients Receiving Chemotherapy  Today you received the following chemotherapy agents Etoposide  To help prevent nausea and vomiting after your treatment, we encourage you to take your nausea medication as directed.    If you develop nausea and vomiting that is not controlled by your nausea medication, call the clinic.   BELOW ARE  SYMPTOMS THAT SHOULD BE REPORTED IMMEDIATELY:  *FEVER GREATER THAN 100.5 F  *CHILLS WITH OR WITHOUT FEVER  NAUSEA AND VOMITING THAT IS NOT CONTROLLED WITH YOUR NAUSEA MEDICATION  *UNUSUAL SHORTNESS OF BREATH  *UNUSUAL BRUISING OR BLEEDING  TENDERNESS IN MOUTH AND THROAT WITH OR WITHOUT PRESENCE OF ULCERS  *URINARY PROBLEMS  *BOWEL PROBLEMS  UNUSUAL RASH Items with * indicate a potential emergency and should be followed up as soon as possible.  Feel free to call the clinic should you have any questions or concerns. The clinic phone number is [phone].  Please show the [place] at check-in to the Emergency Department and triage nurse.

## 2019-05-14 ENCOUNTER — Inpatient Hospital Stay: Payer: Medicare Other

## 2019-05-17 ENCOUNTER — Ambulatory Visit (HOSPITAL_COMMUNITY)
Admission: RE | Admit: 2019-05-17 | Discharge: 2019-05-17 | Disposition: A | Discharge status: Home / Self Care | Payer: Medicare Other | Source: Ambulatory Visit | Attending: Internal Medicine | Admitting: Internal Medicine

## 2019-05-17 ENCOUNTER — Encounter (HOSPITAL_COMMUNITY): Payer: Self-pay

## 2019-05-17 ENCOUNTER — Other Ambulatory Visit: Payer: Self-pay

## 2019-05-17 DIAGNOSIS — C3491 Malignant neoplasm of unspecified part of right bronchus or lung: Secondary | ICD-10-CM

## 2019-05-17 DIAGNOSIS — D3501 Benign neoplasm of right adrenal gland: Secondary | ICD-10-CM | POA: Diagnosis not present

## 2019-05-17 MED ORDER — IOHEXOL 300 MG/ML  SOLN
100.0000 mL | Freq: Once | INTRAMUSCULAR | Status: AC | PRN
  Administered 2019-05-17: 14:00:00 100 mL via INTRAVENOUS

## 2019-05-17 MED ORDER — HEPARIN SOD (PORK) LOCK FLUSH 100 UNIT/ML IV SOLN
INTRAVENOUS | Status: AC
  Administered 2019-05-17: 14:00:00 500 [IU] via INTRAVENOUS
  Filled 2019-05-17: qty 5

## 2019-05-17 MED ORDER — HEPARIN SOD (PORK) LOCK FLUSH 100 UNIT/ML IV SOLN
500.0000 [IU] | Freq: Once | INTRAVENOUS | Status: AC
  Administered 2019-05-17: 14:00:00 500 [IU] via INTRAVENOUS

## 2019-05-17 MED ORDER — SODIUM CHLORIDE (PF) 0.9 % IJ SOLN
INTRAMUSCULAR | Status: AC
  Filled 2019-05-17: qty 50

## 2019-05-21 ENCOUNTER — Inpatient Hospital Stay: Payer: Medicare Other

## 2019-05-21 ENCOUNTER — Inpatient Hospital Stay: Payer: Medicare Other | Attending: Family Medicine

## 2019-05-21 DIAGNOSIS — C3411 Malignant neoplasm of upper lobe, right bronchus or lung: Secondary | ICD-10-CM | POA: Insufficient documentation

## 2019-05-21 DIAGNOSIS — Z5112 Encounter for antineoplastic immunotherapy: Secondary | ICD-10-CM | POA: Insufficient documentation

## 2019-05-21 DIAGNOSIS — Z791 Long term (current) use of non-steroidal anti-inflammatories (NSAID): Secondary | ICD-10-CM | POA: Insufficient documentation

## 2019-05-21 DIAGNOSIS — D6481 Anemia due to antineoplastic chemotherapy: Secondary | ICD-10-CM | POA: Insufficient documentation

## 2019-05-21 DIAGNOSIS — T451X5D Adverse effect of antineoplastic and immunosuppressive drugs, subsequent encounter: Secondary | ICD-10-CM | POA: Insufficient documentation

## 2019-05-21 DIAGNOSIS — J449 Chronic obstructive pulmonary disease, unspecified: Secondary | ICD-10-CM | POA: Insufficient documentation

## 2019-05-21 DIAGNOSIS — Z923 Personal history of irradiation: Secondary | ICD-10-CM | POA: Insufficient documentation

## 2019-05-21 DIAGNOSIS — Z79899 Other long term (current) drug therapy: Secondary | ICD-10-CM | POA: Insufficient documentation

## 2019-05-21 DIAGNOSIS — Z7951 Long term (current) use of inhaled steroids: Secondary | ICD-10-CM | POA: Insufficient documentation

## 2019-05-28 ENCOUNTER — Encounter: Payer: Self-pay | Admitting: Internal Medicine

## 2019-05-28 ENCOUNTER — Encounter: Payer: Self-pay | Admitting: *Deleted

## 2019-05-28 ENCOUNTER — Inpatient Hospital Stay: Payer: Medicare Other

## 2019-05-28 ENCOUNTER — Other Ambulatory Visit: Payer: Self-pay

## 2019-05-28 ENCOUNTER — Inpatient Hospital Stay (HOSPITAL_BASED_OUTPATIENT_CLINIC_OR_DEPARTMENT_OTHER): Payer: Medicare Other | Admitting: Internal Medicine

## 2019-05-28 VITALS — BP 109/90 | HR 109 | Temp 99.1°F | Resp 18 | Ht 66.0 in | Wt 212.6 lb

## 2019-05-28 VITALS — HR 98

## 2019-05-28 DIAGNOSIS — Z7951 Long term (current) use of inhaled steroids: Secondary | ICD-10-CM | POA: Diagnosis not present

## 2019-05-28 DIAGNOSIS — R5382 Chronic fatigue, unspecified: Secondary | ICD-10-CM

## 2019-05-28 DIAGNOSIS — Z791 Long term (current) use of non-steroidal anti-inflammatories (NSAID): Secondary | ICD-10-CM | POA: Diagnosis not present

## 2019-05-28 DIAGNOSIS — Z5112 Encounter for antineoplastic immunotherapy: Secondary | ICD-10-CM | POA: Diagnosis not present

## 2019-05-28 DIAGNOSIS — Z923 Personal history of irradiation: Secondary | ICD-10-CM

## 2019-05-28 DIAGNOSIS — D6481 Anemia due to antineoplastic chemotherapy: Secondary | ICD-10-CM | POA: Diagnosis not present

## 2019-05-28 DIAGNOSIS — C3411 Malignant neoplasm of upper lobe, right bronchus or lung: Secondary | ICD-10-CM

## 2019-05-28 DIAGNOSIS — Z95828 Presence of other vascular implants and grafts: Secondary | ICD-10-CM

## 2019-05-28 DIAGNOSIS — C3491 Malignant neoplasm of unspecified part of right bronchus or lung: Secondary | ICD-10-CM

## 2019-05-28 DIAGNOSIS — J449 Chronic obstructive pulmonary disease, unspecified: Secondary | ICD-10-CM | POA: Diagnosis not present

## 2019-05-28 DIAGNOSIS — Z79899 Other long term (current) drug therapy: Secondary | ICD-10-CM

## 2019-05-28 DIAGNOSIS — T451X5D Adverse effect of antineoplastic and immunosuppressive drugs, subsequent encounter: Secondary | ICD-10-CM | POA: Diagnosis not present

## 2019-05-28 LAB — CBC WITH DIFFERENTIAL (CANCER CENTER ONLY)
Abs Immature Granulocytes: 0.07 10*3/uL (ref 0.00–0.07)
Basophils Absolute: 0 10*3/uL (ref 0.0–0.1)
Basophils Relative: 1 %
Eosinophils Absolute: 0.1 10*3/uL (ref 0.0–0.5)
Eosinophils Relative: 1 %
HCT: 26.4 % — ABNORMAL LOW (ref 36.0–46.0)
Hemoglobin: 8.7 g/dL — ABNORMAL LOW (ref 12.0–15.0)
Immature Granulocytes: 1 %
Lymphocytes Relative: 13 %
Lymphs Abs: 0.8 10*3/uL (ref 0.7–4.0)
MCH: 31.5 pg (ref 26.0–34.0)
MCHC: 33 g/dL (ref 30.0–36.0)
MCV: 95.7 fL (ref 80.0–100.0)
Monocytes Absolute: 0.5 10*3/uL (ref 0.1–1.0)
Monocytes Relative: 8 %
Neutro Abs: 4.9 10*3/uL (ref 1.7–7.7)
Neutrophils Relative %: 76 %
Platelet Count: 190 10*3/uL (ref 150–400)
RBC: 2.76 MIL/uL — ABNORMAL LOW (ref 3.87–5.11)
RDW: 19.6 % — ABNORMAL HIGH (ref 11.5–15.5)
WBC Count: 6.5 10*3/uL (ref 4.0–10.5)
nRBC: 0.6 % — ABNORMAL HIGH (ref 0.0–0.2)

## 2019-05-28 LAB — CMP (CANCER CENTER ONLY)
ALT: 7 U/L (ref 0–44)
AST: 12 U/L — ABNORMAL LOW (ref 15–41)
Albumin: 3.5 g/dL (ref 3.5–5.0)
Alkaline Phosphatase: 107 U/L (ref 38–126)
Anion gap: 11 (ref 5–15)
BUN: 11 mg/dL (ref 8–23)
CO2: 26 mmol/L (ref 22–32)
Calcium: 9 mg/dL (ref 8.9–10.3)
Chloride: 104 mmol/L (ref 98–111)
Creatinine: 1.32 mg/dL — ABNORMAL HIGH (ref 0.44–1.00)
GFR, Est AFR Am: 49 mL/min — ABNORMAL LOW (ref >60)
GFR, Estimated: 42 mL/min — ABNORMAL LOW (ref >60)
Glucose, Bld: 85 mg/dL (ref 70–99)
Potassium: 3.3 mmol/L — ABNORMAL LOW (ref 3.5–5.1)
Sodium: 141 mmol/L (ref 135–145)
Total Bilirubin: 0.3 mg/dL (ref 0.3–1.2)
Total Protein: 6.4 g/dL — ABNORMAL LOW (ref 6.5–8.1)

## 2019-05-28 LAB — TSH: TSH: 7.696 u[IU]/mL — ABNORMAL HIGH (ref 0.308–3.960)

## 2019-05-28 MED ORDER — SODIUM CHLORIDE 0.9 % IV SOLN
Freq: Once | INTRAVENOUS | Status: AC
  Administered 2019-05-28: 12:00:00 via INTRAVENOUS
  Filled 2019-05-28: qty 250

## 2019-05-28 MED ORDER — SODIUM CHLORIDE 0.9% FLUSH
10.0000 mL | INTRAVENOUS | Status: DC | PRN
  Administered 2019-05-28: 13:00:00 10 mL
  Filled 2019-05-28: qty 10

## 2019-05-28 MED ORDER — HEPARIN SOD (PORK) LOCK FLUSH 100 UNIT/ML IV SOLN
500.0000 [IU] | Freq: Once | INTRAVENOUS | Status: AC | PRN
  Administered 2019-05-28: 13:00:00 500 [IU]
  Filled 2019-05-28: qty 5

## 2019-05-28 MED ORDER — SODIUM CHLORIDE 0.9 % IV SOLN
1200.0000 mg | Freq: Once | INTRAVENOUS | Status: AC
  Administered 2019-05-28: 1200 mg via INTRAVENOUS
  Filled 2019-05-28: qty 20

## 2019-05-28 MED ORDER — SODIUM CHLORIDE 0.9% FLUSH
10.0000 mL | Freq: Once | INTRAVENOUS | Status: AC
  Administered 2019-05-28: 11:00:00 10 mL
  Filled 2019-05-28: qty 10

## 2019-05-28 NOTE — Patient Instructions (Signed)
[facility] Discharge Instructions for Patients Receiving Chemotherapy  Today you received the following chemotherapy agents: Atezolizumab Gildardo Pounds)  To help prevent nausea and vomiting after your treatment, we encourage you to take your nausea medication as directed.    If you develop nausea and vomiting that is not controlled by your nausea medication, call the clinic.   BELOW ARE SYMPTOMS THAT SHOULD BE REPORTED IMMEDIATELY:  *FEVER GREATER THAN 100.5 F  *CHILLS WITH OR WITHOUT FEVER  NAUSEA AND VOMITING THAT IS NOT CONTROLLED WITH YOUR NAUSEA MEDICATION  *UNUSUAL SHORTNESS OF BREATH  *UNUSUAL BRUISING OR BLEEDING  TENDERNESS IN MOUTH AND THROAT WITH OR WITHOUT PRESENCE OF ULCERS  *URINARY PROBLEMS  *BOWEL PROBLEMS  UNUSUAL RASH Items with * indicate a potential emergency and should be followed up as soon as possible.  Feel free to call the clinic should you have any questions or concerns. The clinic phone number is [phone].  Please show the [place] at check-in to the Emergency Department and triage nurse.  Coronavirus (COVID-19) Are you at risk?  Are you at risk for the Coronavirus (COVID-19)?  To be considered HIGH RISK for Coronavirus (COVID-19), you have to meet the following criteria:  . Traveled to [place], [place], [place], [place] or [place]; or in the [place] to [place], [place], [place], or [place]; and have fever, cough, and shortness of breath within the last 2 weeks of travel OR . Been in close contact with a person diagnosed with COVID-19 within the last 2 weeks and have fever, cough, and shortness of breath . IF YOU DO NOT MEET THESE CRITERIA, YOU ARE CONSIDERED LOW RISK FOR COVID-19.  What to do if you are HIGH RISK for COVID-19?  Marland Kitchen If you are having a medical emergency, call 911. . Seek medical care right away. Before you go to a doctor's office, urgent care or emergency department, call ahead and tell them  about your recent travel, contact with someone diagnosed with COVID-19, and your symptoms. You should receive instructions from your physician's office regarding next steps of care.  . When you arrive at healthcare provider, tell the healthcare staff immediately you have returned from visiting [place], [place], [place], [place] or [place]; or traveled in the [place] to [place], [place], [place], or [place]; in the last two weeks or you have been in close contact with a person diagnosed with COVID-19 in the last 2 weeks.   . Tell the health care staff about your symptoms: fever, cough and shortness of breath. . After you have been seen by a medical provider, you will be either: o Tested for (COVID-19) and discharged home on quarantine except to seek medical care if symptoms worsen, and asked to  - Stay home and avoid contact with others until you get your results (4-5 days)  - Avoid travel on public transportation if possible (such as bus, train, or airplane) or o Sent to the Emergency Department by EMS for evaluation, COVID-19 testing, and possible admission depending on your condition and test results.  What to do if you are LOW RISK for COVID-19?  Reduce your risk of any infection by using the same precautions used for avoiding the common cold or flu:  Marland Kitchen Wash your hands often with soap and warm water for at least 20 seconds.  If soap and water are not readily available, use an alcohol-based hand sanitizer with at least 60% alcohol.  . If coughing  or sneezing, cover your mouth and nose by coughing or sneezing into the elbow areas of your shirt or coat, into a tissue or into your sleeve (not your hands). . Avoid shaking hands with others and consider head nods or verbal greetings only. . Avoid touching your eyes, nose, or mouth with unwashed hands.  . Avoid close contact with people who are sick. . Avoid places or events with large numbers of people in one location, like concerts or  sporting events. . Carefully consider travel plans you have or are making. . If you are planning any travel outside or inside the [place], visit the CDC's Travelers' Health webpage for the latest health notices. . If you have some symptoms but not all symptoms, continue to monitor at home and seek medical attention if your symptoms worsen. . If you are having a medical emergency, call 911.   [place] / e-Visit: [email or web address]         [facility]: [facility]: [phone]                   [facility]: [phone]

## 2019-05-28 NOTE — Progress Notes (Signed)
[name]:([phone]) [phone]   Fax:(336) [place], [place] [address] [place] [address]  DIAGNOSIS: Recurrent small cell lung cancer initially diagnosed as Limited stage (T2b, N2, M0) small cell lung cancer diagnosed in November 2017 and presented with large right hilar mass with mediastinal invasion and a right lower lobe pulmonary nodule.  The patient had disease recurrence in the right lower lobe and mediastinal lymph nodes in February 2020.  PRIOR THERAPY:   1) Systemic chemotherapy with cisplatin 60 MG/M2 on day 1 and etoposide 120 MG/M2 on days 1, 2 and 3 concurrent with radiation status post 6 cycles.  Last dose was giving March 06, 2017. 2) status post prophylactic cranial irradiation.  CURRENT THERAPY: Systemic chemotherapy with carboplatin for AUC of 5 on day 1, etoposide.  100 mg/M2 on days 1, 2 and 3 as well as Tecentriq 1200 mg IV with Neulasta support every 3 weeks.  First dose March 05, 2019.  Status post 4 cycles.  Starting from cycle 5 the patient will be treated with single agent Tecentriq 1200 mg IV every 3 weeks.  First dose May 28, 2019.  INTERVAL HISTORY: Tammy Bowers 68 y.o. female returns to the clinic today for follow-up visit.  The patient is feeling fine today with no concerning complaints except for fatigue.  She denied having any current chest pain but has shortness of breath with exertion with no cough or hemoptysis.  She denied having any recent weight loss or night sweats.  She has no nausea, vomiting, diarrhea or constipation.  She denied having any headache or visual changes.  She tolerated the last cycle of her systemic chemotherapy fairly well.  She had repeat CT scan of the chest, abdomen pelvis performed recently and she is here for evaluation before starting the first cycle of her maintenance therapy.  The patient also mentions that she is breathing to stay with her sister in  [place] for the next 2 months.  MEDICAL HISTORY: Past Medical History:  Diagnosis Date   Adenopathy 10/10/2016   PERICARINAL   Anemia    Arthritis    Asthma    Cancer ([place])    lung cancer Small Cell   COPD (chronic obstructive pulmonary disease) ([place] [facility])    Dyspnea    Headache    migraines   Hilar mass 10/10/2016   RIGHT   History of kidney stones    Insomnia    Lung nodules 10/10/2016   RIGHT LOWER LOBE   Mass of both adrenal glands ([facility]) 10/10/2016   Mood swing    Pneumonia    Spinal headache    Substance abuse ([place])    TOBACCO   UNSPECIFIED INFECTION OF BONE ANKLE AND FOOT 10/22/2009   Annotation: left ankle Qualifier: Diagnosis of  By: Patsy Baltimore RN, Denise      ALLERGIES:  is allergic to lidocaine.  MEDICATIONS:  Current Outpatient Medications  Medication Sig Dispense Refill   albuterol (PROVENTIL HFA;VENTOLIN HFA) 108 (90 BASE) MCG/ACT inhaler Inhale 2 puffs into the lungs every 4 (four) hours as needed for wheezing.     Cyclobenzaprine HCl (FLEXERIL PO) Take 5 mg by mouth 3 (three) times daily as needed (muscle spasms).      Fluticasone-Salmeterol (ADVAIR) 500-50 MCG/DOSE AEPB Inhale 1 puff into the lungs 2 (two) times daily.     ibuprofen (ADVIL) 800 MG tablet Take 800 mg by mouth every 8 (eight) hours as  needed.     LORazepam (ATIVAN) 0.5 MG tablet Take 1 tablet (0.5 mg total) by mouth at bedtime. (Patient taking differently: Take 0.5 mg by mouth at bedtime as needed for anxiety. ) 20 tablet 0   Multiple Vitamin (MULTIVITAMIN WITH MINERALS) TABS tablet Take 1 tablet by mouth daily.      prochlorperazine (COMPAZINE) 10 MG tablet TAKE 1 TABLET(10 MG) BY MOUTH EVERY 6 HOURS AS NEEDED FOR NAUSEA OR VOMITING 30 tablet 0   sertraline (ZOLOFT) 50 MG tablet Take 50 mg by mouth at bedtime.  5   diphenoxylate-atropine (LOMOTIL) 2.5-0.025 MG tablet Take 1 tablet by mouth 4 (four) times daily as needed for diarrhea or loose stools. (Patient not taking:  Reported on 05/28/2019) 30 tablet 0   No current facility-administered medications for this visit.    Facility-Administered Medications Ordered in Other Visits  Medication Dose Route Frequency Provider Last Rate Last Dose   pegfilgrastim-cbqv (UDENYCA) injection 6 mg  6 mg Subcutaneous Once Curt Bears, MD        SURGICAL HISTORY:  Past Surgical History:  Procedure Laterality Date   ANKLE SURGERY     hardware removal, and initial ankle surgery   APPENDECTOMY     IR IMAGING GUIDED PORT INSERTION  04/12/2019   LUNG BIOPSY N/A 10/19/2016   Procedure: LUNG BIOPSY;  Surgeon: Grace Isaac, MD;  Location: [facility];  Service: Thoracic;  Laterality: N/A;   LUNG BIOPSY N/A 02/20/2019   Procedure: LUNG BIOPSY;  Surgeon: Grace Isaac, MD;  Location: [facility];  Service: Thoracic;  Laterality: N/A;   TONSILLECTOMY     VIDEO BRONCHOSCOPY WITH ENDOBRONCHIAL ULTRASOUND N/A 10/19/2016   Procedure: VIDEO BRONCHOSCOPY WITH ENDOBRONCHIAL ULTRASOUND;  Surgeon: Grace Isaac, MD;  Location: [facility];  Service: Thoracic;  Laterality: N/A;   VIDEO BRONCHOSCOPY WITH ENDOBRONCHIAL ULTRASOUND N/A 02/20/2019   Procedure: VIDEO BRONCHOSCOPY WITH ENDOBRONCHIAL ULTRASOUND;  Surgeon: Grace Isaac, MD;  Location: [facility];  Service: Thoracic;  Laterality: N/A;    REVIEW OF SYSTEMS:  Constitutional: positive for fatigue Eyes: negative Ears, nose, mouth, throat, and face: negative Respiratory: positive for dyspnea on exertion Cardiovascular: negative Gastrointestinal: negative Genitourinary:negative Integument/breast: negative Hematologic/lymphatic: negative Musculoskeletal:negative Neurological: negative Behavioral/Psych: negative Endocrine: negative Allergic/Immunologic: negative   PHYSICAL EXAMINATION: General appearance: alert, cooperative, fatigued and no distress Head: Normocephalic, without obvious abnormality, atraumatic Neck: no adenopathy, no JVD, supple, symmetrical, trachea midline  and thyroid not enlarged, symmetric, no tenderness/mass/nodules Lymph nodes: Cervical, supraclavicular, and axillary nodes normal. Resp: clear to auscultation bilaterally Back: symmetric, no curvature. ROM normal. No CVA tenderness. Cardio: regular rate and rhythm, S1, S2 normal, no murmur, click, rub or gallop GI: soft, non-tender; bowel sounds normal; no masses,  no organomegaly Extremities: extremities normal, atraumatic, no cyanosis or edema Neurologic: Alert and oriented X 3, normal strength and tone. Normal symmetric reflexes. Normal coordination and gait  ECOG PERFORMANCE STATUS: 1 - Symptomatic but completely ambulatory  Blood pressure 109/90, pulse (!) 109, temperature 99.1 F (37.3 C), temperature source Oral, resp. rate 18, height 5\' 6"  (1.676 m), weight 212 lb 9.6 oz (96.4 kg), SpO2 99 %.  LABORATORY DATA: Lab Results  Component Value Date   WBC 6.5 05/28/2019   HGB 8.7 (L) 05/28/2019   HCT 26.4 (L) 05/28/2019   MCV 95.7 05/28/2019   PLT 190 05/28/2019      Chemistry      Component Value Date/Time   NA 144 05/07/2019 1020   NA 139 11/24/2017 0913  K 3.5 05/07/2019 1020   K 3.8 11/24/2017 0913   CL 107 05/07/2019 1020   CO2 26 05/07/2019 1020   CO2 24 11/24/2017 0913   BUN 17 05/07/2019 1020   BUN 22.1 11/24/2017 0913   CREATININE 1.12 (H) 05/07/2019 1020   CREATININE 1.2 (H) 11/24/2017 0913      Component Value Date/Time   CALCIUM 9.1 05/07/2019 1020   CALCIUM 9.7 11/24/2017 0913   ALKPHOS 101 05/07/2019 1020   ALKPHOS 99 11/24/2017 0913   AST 10 (L) 05/07/2019 1020   AST 15 11/24/2017 0913   ALT 9 05/07/2019 1020   ALT 7 11/24/2017 0913   BILITOT 0.2 (L) 05/07/2019 1020   BILITOT 0.37 11/24/2017 0913       RADIOGRAPHIC STUDIES: Ct Chest W Contrast  Result Date: 05/17/2019 CLINICAL DATA:  Metastatic small cell lung cancer restaging EXAM: CT CHEST, ABDOMEN, AND PELVIS WITH CONTRAST TECHNIQUE: Multidetector CT imaging of the chest, abdomen and  pelvis was performed following the standard protocol during bolus administration of intravenous contrast. CONTRAST:  155mL OMNIPAQUE IOHEXOL 300 MG/ML  SOLN COMPARISON:  PET-CT 01/28/2019 FINDINGS: CT CHEST FINDINGS Cardiovascular: Right Port-A-Cath tip: Lower SVC. Adjacent filling defect in the SVC measuring 1.9 by 1.0 cm on image 31/2, probably representing fibrin sheath or thrombus adjacent to the SVC tip. Atherosclerotic calcification of the aortic arch and left anterior descending coronary artery. Mediastinum/Nodes: Stable right perihilar density surrounding the right mainstem bronchus and bronchus intermedius with adjacent calcified subcarinal lymph node measuring 0.8 cm in short axis on image 28/2, formerly 1.4 cm in diameter. Lungs/Pleura: Right perihilar density is again observed with stenosis but improved patency of the right middle lobe bronchus which was previously completely occluded, and improved caliber of the bronchus intermedius although still narrowed. Paraseptal emphysema is observed. There is right perihilar volume loss. A subpleural ground-glass density nodule in the right lower lobe on image 68/6 measures 0.7 by 0.4 cm, previously 0.9 by 0.7 cm. A densely calcified granuloma is present in the right middle lobe. Some of the previous right middle lobe atelectasis has intervally cleared. Musculoskeletal: Unremarkable CT ABDOMEN PELVIS FINDINGS Hepatobiliary: 3 mm nonspecific hypodense lesion inferiorly in the right hepatic lobe on image 74/2 common not readily apparent on other exams although we do not have a prior CT abdomen to compare to, only PET CTs and chest CTs exams. Liver appears otherwise unremarkable. Contracted gallbladder. Pancreas: Unremarkable Spleen: Unremarkable Adrenals/Urinary Tract: 3.6 by 3.0 cm right adrenal adenoma. 0.8 by 0.6 cm hypodense lesion of the left kidney upper pole posteriorly, likely a cyst but technically too small to characterize. Urinary bladder unremarkable.  Stomach/Bowel: Unremarkable Vascular/Lymphatic: Aortoiliac atherosclerotic vascular disease. No pathologic adenopathy. Reproductive: Unremarkable Other: No supplemental non-categorized findings. Musculoskeletal: Unremarkable IMPRESSION: 1. Overall improved caliber of the right tracheobronchial tree in the vicinity of the bronchus intermedius and right middle lobe bronchus indicating some reduction in active tumor in this vicinity. The adjacent subcarinal adenopathy is also considerably improved. There is right perihilar airspace opacity which is likely primarily from prior radiation therapy. Please note that some component of residual active tumor in this vicinity is difficult to completely exclude and surveillance by PET-CT will likely be helpful. 2. Reduction in size of a ground-glass density nodule in the right lower lobe. 3. There is a filling defect in the SVC adjacent to the Port-A-Cath tip, probably a fibrin sheath or small thrombus. If the catheter is not flushing easily then this could be referred to interventional radiology  for possible therapy. 4. Other imaging findings of potential clinical significance: Aortic Atherosclerosis (ICD10-I70.0). Coronary atherosclerosis. Right adrenal adenoma. Electronically Signed   By: Van Clines M.D.   On: 05/17/2019 14:49   Ct Abdomen Pelvis W Contrast  Result Date: 05/17/2019 CLINICAL DATA:  Metastatic small cell lung cancer restaging EXAM: CT CHEST, ABDOMEN, AND PELVIS WITH CONTRAST TECHNIQUE: Multidetector CT imaging of the chest, abdomen and pelvis was performed following the standard protocol during bolus administration of intravenous contrast. CONTRAST:  182mL OMNIPAQUE IOHEXOL 300 MG/ML  SOLN COMPARISON:  PET-CT 01/28/2019 FINDINGS: CT CHEST FINDINGS Cardiovascular: Right Port-A-Cath tip: Lower SVC. Adjacent filling defect in the SVC measuring 1.9 by 1.0 cm on image 31/2, probably representing fibrin sheath or thrombus adjacent to the SVC tip.  Atherosclerotic calcification of the aortic arch and left anterior descending coronary artery. Mediastinum/Nodes: Stable right perihilar density surrounding the right mainstem bronchus and bronchus intermedius with adjacent calcified subcarinal lymph node measuring 0.8 cm in short axis on image 28/2, formerly 1.4 cm in diameter. Lungs/Pleura: Right perihilar density is again observed with stenosis but improved patency of the right middle lobe bronchus which was previously completely occluded, and improved caliber of the bronchus intermedius although still narrowed. Paraseptal emphysema is observed. There is right perihilar volume loss. A subpleural ground-glass density nodule in the right lower lobe on image 68/6 measures 0.7 by 0.4 cm, previously 0.9 by 0.7 cm. A densely calcified granuloma is present in the right middle lobe. Some of the previous right middle lobe atelectasis has intervally cleared. Musculoskeletal: Unremarkable CT ABDOMEN PELVIS FINDINGS Hepatobiliary: 3 mm nonspecific hypodense lesion inferiorly in the right hepatic lobe on image 74/2 common not readily apparent on other exams although we do not have a prior CT abdomen to compare to, only PET CTs and chest CTs exams. Liver appears otherwise unremarkable. Contracted gallbladder. Pancreas: Unremarkable Spleen: Unremarkable Adrenals/Urinary Tract: 3.6 by 3.0 cm right adrenal adenoma. 0.8 by 0.6 cm hypodense lesion of the left kidney upper pole posteriorly, likely a cyst but technically too small to characterize. Urinary bladder unremarkable. Stomach/Bowel: Unremarkable Vascular/Lymphatic: Aortoiliac atherosclerotic vascular disease. No pathologic adenopathy. Reproductive: Unremarkable Other: No supplemental non-categorized findings. Musculoskeletal: Unremarkable IMPRESSION: 1. Overall improved caliber of the right tracheobronchial tree in the vicinity of the bronchus intermedius and right middle lobe bronchus indicating some reduction in active  tumor in this vicinity. The adjacent subcarinal adenopathy is also considerably improved. There is right perihilar airspace opacity which is likely primarily from prior radiation therapy. Please note that some component of residual active tumor in this vicinity is difficult to completely exclude and surveillance by PET-CT will likely be helpful. 2. Reduction in size of a ground-glass density nodule in the right lower lobe. 3. There is a filling defect in the SVC adjacent to the Port-A-Cath tip, probably a fibrin sheath or small thrombus. If the catheter is not flushing easily then this could be referred to interventional radiology for possible therapy. 4. Other imaging findings of potential clinical significance: Aortic Atherosclerosis (ICD10-I70.0). Coronary atherosclerosis. Right adrenal adenoma. Electronically Signed   By: Van Clines M.D.   On: 05/17/2019 14:49    ASSESSMENT AND PLAN:  This is a very pleasant 68 years old white female with recurrent small cell lung cancer that was initially diagnosed as limited stage disease status post a course of systemic chemotherapy with cisplatin and etoposide for 6 cycles concurrent with radiation. She tolerated her treatment well except for fatigue. She is also status post prophylactic cranial  irradiation. The patient has been in observation since March 2018. She was found on recent imaging studies to have evidence for disease recurrence with right lower lobe pulmonary nodules in addition to mediastinal lymphadenopathy. Repeat bronchoscopy with endobronchial ultrasound and biopsy confirmed recurrence of his small cell lung cancer. The patient was started on systemic chemotherapy with carboplatin, etoposide and Tecentriq status post 4 cycles.   She has been tolerating this treatment well with no concerning adverse effect except for fatigue secondary to chemotherapy-induced anemia. She had repeat CT scan of the chest, abdomen pelvis performed recently.   PET scan showed further improvement of her disease. I recommended for the patient to proceed with first dose of her maintenance treatment with Tecentriq today.  The patient plans to move to [place] to stay with her sister for the next few months. I strongly recommended for her to establish care with her medical oncologist to continue her treatment. I will see her back for follow-up visit after her return from [place]. She was advised to call immediately if she has any concerning symptoms in the interval. The patient voices understanding of current disease status and treatment options and is in agreement with the current care plan.  All questions were answered. The patient knows to call the clinic with any problems, questions or concerns. We can certainly see the patient much sooner if necessary.  Disclaimer: This note was dictated with voice recognition software. Similar sounding words can inadvertently be transcribed and may not be corrected upon review.

## 2019-05-28 NOTE — Progress Notes (Signed)
Oncology Nurse Navigator Documentation  Oncology Nurse Navigator Flowsheets 05/28/2019  Navigator Location [facility]  Navigator Encounter Type Clinic/MDC/I spoke with patient today at [facility].  She will be moving to [place] in 2 days.  She is not set up with an oncologist but states her sister is working on this.  I asked that she notify us asap so we can get her records to oncology there.  She states she will update [place].    Telephone -  Abnormal Finding Date -  Confirmed Diagnosis Date -  Multidisiplinary Clinic Date -  Patient Visit Type -  Treatment Phase Treatment  Barriers/Navigation Needs Education  Education Other  Interventions Education  Coordination of Care -  Education Method Verbal  Acuity Level 1  Acuity Level 1 -  Acuity Level 2 -  Time Spent with Patient 30

## 2019-05-28 NOTE — Progress Notes (Signed)
Spoke with Dr. Julien Nordmann, he stated to leave PT accessed today and that her treatment plan may change and if so port would be taken out by nurse today. Left biopatch and dressing on.

## 2019-05-28 NOTE — Patient Instructions (Signed)

## 2019-05-29 ENCOUNTER — Inpatient Hospital Stay: Payer: Medicare Other

## 2019-05-30 ENCOUNTER — Inpatient Hospital Stay: Payer: Medicare Other

## 2019-06-05 ENCOUNTER — Telehealth: Payer: Self-pay | Admitting: *Deleted

## 2019-06-05 NOTE — Telephone Encounter (Signed)
Received call from patient asking about getting her treatments in [place]. She moved there last week. Spoke with patient and informed her that her sister would be contacting an oncologist there in [place] so we can send her medical records there so she can get established with a practice there. Pt voiced understanding, though she stated she is having trouble remembering things. She will discuss with her sister.

## 2019-06-06 ENCOUNTER — Telehealth: Payer: Self-pay | Admitting: Medical Oncology

## 2019-06-06 ENCOUNTER — Telehealth: Payer: Self-pay | Admitting: Pharmacist

## 2019-06-06 NOTE — Telephone Encounter (Signed)
error 

## 2019-06-06 NOTE — Telephone Encounter (Signed)
Referral to Dr Theodis Aguas at [facility]. Fax records to [phone]. Pt has moved to [place]. Called and faxed records to Dr Reuel Boom office.

## 2019-06-18 ENCOUNTER — Encounter: Payer: Self-pay | Admitting: Internal Medicine

## 2019-06-18 ENCOUNTER — Inpatient Hospital Stay: Payer: Medicare Other

## 2019-06-18 ENCOUNTER — Telehealth: Payer: Self-pay | Admitting: Internal Medicine

## 2019-06-18 ENCOUNTER — Inpatient Hospital Stay (HOSPITAL_BASED_OUTPATIENT_CLINIC_OR_DEPARTMENT_OTHER): Payer: Medicare Other | Admitting: Internal Medicine

## 2019-06-18 ENCOUNTER — Other Ambulatory Visit: Payer: Self-pay

## 2019-06-18 VITALS — BP 121/88 | HR 110 | Temp 98.5°F | Resp 18 | Ht 66.0 in | Wt 212.9 lb

## 2019-06-18 VITALS — HR 97

## 2019-06-18 DIAGNOSIS — Z5112 Encounter for antineoplastic immunotherapy: Secondary | ICD-10-CM

## 2019-06-18 DIAGNOSIS — C3491 Malignant neoplasm of unspecified part of right bronchus or lung: Secondary | ICD-10-CM

## 2019-06-18 DIAGNOSIS — Z923 Personal history of irradiation: Secondary | ICD-10-CM | POA: Diagnosis not present

## 2019-06-18 DIAGNOSIS — D6481 Anemia due to antineoplastic chemotherapy: Secondary | ICD-10-CM

## 2019-06-18 DIAGNOSIS — Z7951 Long term (current) use of inhaled steroids: Secondary | ICD-10-CM

## 2019-06-18 DIAGNOSIS — Z79899 Other long term (current) drug therapy: Secondary | ICD-10-CM

## 2019-06-18 DIAGNOSIS — Z791 Long term (current) use of non-steroidal anti-inflammatories (NSAID): Secondary | ICD-10-CM | POA: Diagnosis not present

## 2019-06-18 DIAGNOSIS — C3411 Malignant neoplasm of upper lobe, right bronchus or lung: Secondary | ICD-10-CM

## 2019-06-18 DIAGNOSIS — J449 Chronic obstructive pulmonary disease, unspecified: Secondary | ICD-10-CM | POA: Diagnosis not present

## 2019-06-18 DIAGNOSIS — Z95828 Presence of other vascular implants and grafts: Secondary | ICD-10-CM

## 2019-06-18 DIAGNOSIS — R5382 Chronic fatigue, unspecified: Secondary | ICD-10-CM

## 2019-06-18 DIAGNOSIS — T451X5D Adverse effect of antineoplastic and immunosuppressive drugs, subsequent encounter: Secondary | ICD-10-CM | POA: Diagnosis not present

## 2019-06-18 LAB — CBC WITH DIFFERENTIAL (CANCER CENTER ONLY)
Abs Immature Granulocytes: 0.02 10*3/uL (ref 0.00–0.07)
Basophils Absolute: 0.1 10*3/uL (ref 0.0–0.1)
Basophils Relative: 1 %
Eosinophils Absolute: 0.2 10*3/uL (ref 0.0–0.5)
Eosinophils Relative: 3 %
HCT: 33.5 % — ABNORMAL LOW (ref 36.0–46.0)
Hemoglobin: 11 g/dL — ABNORMAL LOW (ref 12.0–15.0)
Immature Granulocytes: 0 %
Lymphocytes Relative: 10 %
Lymphs Abs: 0.6 10*3/uL — ABNORMAL LOW (ref 0.7–4.0)
MCH: 33.3 pg (ref 26.0–34.0)
MCHC: 32.8 g/dL (ref 30.0–36.0)
MCV: 101.5 fL — ABNORMAL HIGH (ref 80.0–100.0)
Monocytes Absolute: 0.4 10*3/uL (ref 0.1–1.0)
Monocytes Relative: 7 %
Neutro Abs: 4.6 10*3/uL (ref 1.7–7.7)
Neutrophils Relative %: 79 %
Platelet Count: 260 10*3/uL (ref 150–400)
RBC: 3.3 MIL/uL — ABNORMAL LOW (ref 3.87–5.11)
RDW: 15.1 % (ref 11.5–15.5)
WBC Count: 5.9 10*3/uL (ref 4.0–10.5)
nRBC: 0 % (ref 0.0–0.2)

## 2019-06-18 LAB — CMP (CANCER CENTER ONLY)
ALT: 12 U/L (ref 0–44)
AST: 16 U/L (ref 15–41)
Albumin: 3.6 g/dL (ref 3.5–5.0)
Alkaline Phosphatase: 93 U/L (ref 38–126)
Anion gap: 9 (ref 5–15)
BUN: 19 mg/dL (ref 8–23)
CO2: 26 mmol/L (ref 22–32)
Calcium: 9.3 mg/dL (ref 8.9–10.3)
Chloride: 107 mmol/L (ref 98–111)
Creatinine: 1.15 mg/dL — ABNORMAL HIGH (ref 0.44–1.00)
GFR, Est AFR Am: 57 mL/min — ABNORMAL LOW (ref >60)
GFR, Estimated: 50 mL/min — ABNORMAL LOW (ref >60)
Glucose, Bld: 87 mg/dL (ref 70–99)
Potassium: 3.4 mmol/L — ABNORMAL LOW (ref 3.5–5.1)
Sodium: 142 mmol/L (ref 135–145)
Total Bilirubin: 0.3 mg/dL (ref 0.3–1.2)
Total Protein: 6.6 g/dL (ref 6.5–8.1)

## 2019-06-18 LAB — TSH: TSH: 0.843 u[IU]/mL (ref 0.308–3.960)

## 2019-06-18 MED ORDER — SODIUM CHLORIDE 0.9 % IV SOLN
1200.0000 mg | Freq: Once | INTRAVENOUS | Status: AC
  Administered 2019-06-18: 1200 mg via INTRAVENOUS
  Filled 2019-06-18: qty 20

## 2019-06-18 MED ORDER — SODIUM CHLORIDE 0.9% FLUSH
10.0000 mL | Freq: Once | INTRAVENOUS | Status: AC
  Administered 2019-06-18: 10 mL
  Filled 2019-06-18: qty 10

## 2019-06-18 MED ORDER — HEPARIN SOD (PORK) LOCK FLUSH 100 UNIT/ML IV SOLN
500.0000 [IU] | Freq: Once | INTRAVENOUS | Status: AC | PRN
  Administered 2019-06-18: 500 [IU]
  Filled 2019-06-18: qty 5

## 2019-06-18 MED ORDER — SODIUM CHLORIDE 0.9% FLUSH
10.0000 mL | INTRAVENOUS | Status: DC | PRN
  Administered 2019-06-18: 10 mL
  Filled 2019-06-18: qty 10

## 2019-06-18 MED ORDER — SODIUM CHLORIDE 0.9 % IV SOLN
Freq: Once | INTRAVENOUS | Status: AC
  Administered 2019-06-18: 11:00:00 via INTRAVENOUS
  Filled 2019-06-18: qty 250

## 2019-06-18 NOTE — Telephone Encounter (Signed)
Scheduled appt per 6/30 los - added additional cycles - pt to get an updated schedule next visit.

## 2019-06-18 NOTE — Progress Notes (Signed)
[facility] [phone]   Fax:([phone]) [place], MD [address] [place] [address]  DIAGNOSIS: Recurrent small cell lung cancer initially diagnosed as Limited stage (T2b, N2, M0) small cell lung cancer diagnosed in November 2017 and presented with large right hilar mass with mediastinal invasion and a right lower lobe pulmonary nodule.  The patient had disease recurrence in the right lower lobe and mediastinal lymph nodes in February 2020.  PRIOR THERAPY:   1) Systemic chemotherapy with cisplatin 60 MG/M2 on day 1 and etoposide 120 MG/M2 on days 1, 2 and 3 concurrent with radiation status post 6 cycles.  Last dose was giving March 06, 2017. 2) status post prophylactic cranial irradiation.  CURRENT THERAPY: Systemic chemotherapy with carboplatin for AUC of 5 on day 1, etoposide.  100 mg/M2 on days 1, 2 and 3 as well as Tecentriq 1200 mg IV with Neulasta support every 3 weeks.  First dose March 05, 2019.  Status post 5 cycles.  Starting from cycle 5, the patient will be treated with single agent Tecentriq 1200 mg IV every 3 weeks.  First dose May 28, 2019.  INTERVAL HISTORY: Tammy Bowers 68 y.o. female returns to the clinic today for follow-up visit.  The patient traveled to [place] to stay with her sister but it did not work out and she came back to [place] to resume her treatment.  She tolerated the first cycle of her maintenance treatment with Tecentriq fairly well.  The patient denied having any nausea, vomiting, diarrhea or constipation.  She has no chest pain but continues to have shortness of breath with exertion and mild fatigue.  She has no recent weight loss or night sweats.  She is here today for evaluation before starting cycle #6.  MEDICAL HISTORY: Past Medical History:  Diagnosis Date  . Adenopathy 10/10/2016   PERICARINAL  . Anemia   . Arthritis   . Asthma   . Cancer ([facility])    lung  cancer Small Cell  . COPD (chronic obstructive pulmonary disease) ([facility])   . Dyspnea   . Headache    migraines  . Hilar mass 10/10/2016   RIGHT  . History of kidney stones   . Insomnia   . Lung nodules 10/10/2016   RIGHT LOWER LOBE  . Mass of both adrenal glands ([facility]) 10/10/2016  . Mood swing   . Pneumonia   . Spinal headache   . Substance abuse ([place])    TOBACCO  . UNSPECIFIED INFECTION OF BONE ANKLE AND FOOT 10/22/2009   Annotation: left ankle Qualifier: Diagnosis of  By: Patsy [place] RN, Denise      ALLERGIES:  is allergic to lidocaine.  MEDICATIONS:  Current Outpatient Medications  Medication Sig Dispense Refill  . albuterol (PROVENTIL HFA;VENTOLIN HFA) 108 (90 BASE) MCG/ACT inhaler Inhale 2 puffs into the lungs every 4 (four) hours as needed for wheezing.    . Cyclobenzaprine HCl (FLEXERIL PO) Take 5 mg by mouth 3 (three) times daily as needed (muscle spasms).     . diphenoxylate-atropine (LOMOTIL) 2.5-0.025 MG tablet Take 1 tablet by mouth 4 (four) times daily as needed for diarrhea or loose stools. 30 tablet 0  . Fluticasone-Salmeterol (ADVAIR) 500-50 MCG/DOSE AEPB Inhale 1 puff into the lungs 2 (two) times daily.    Marland Kitchen ibuprofen (ADVIL) 800 MG tablet Take 800 mg by mouth every 8 (eight) hours as needed.    Marland Kitchen LORazepam (ATIVAN)  0.5 MG tablet Take 1 tablet (0.5 mg total) by mouth at bedtime. (Patient taking differently: Take 0.5 mg by mouth at bedtime as needed for anxiety. ) 20 tablet 0  . Multiple Vitamin (MULTIVITAMIN WITH MINERALS) TABS tablet Take 1 tablet by mouth daily.     . prochlorperazine (COMPAZINE) 10 MG tablet TAKE 1 TABLET(10 MG) BY MOUTH EVERY 6 HOURS AS NEEDED FOR NAUSEA OR VOMITING 30 tablet 0  . sertraline (ZOLOFT) 50 MG tablet Take 50 mg by mouth at bedtime.  5   No current facility-administered medications for this visit.    Facility-Administered Medications Ordered in Other Visits  Medication Dose Route Frequency Provider Last Rate Last Dose  .  pegfilgrastim-cbqv (UDENYCA) injection 6 mg  6 mg Subcutaneous Once Curt Bears, MD        SURGICAL HISTORY:  Past Surgical History:  Procedure Laterality Date  . ANKLE SURGERY     hardware removal, and initial ankle surgery  . APPENDECTOMY    . IR IMAGING GUIDED PORT INSERTION  04/12/2019  . LUNG BIOPSY N/A 10/19/2016   Procedure: LUNG BIOPSY;  Surgeon: Grace Isaac, MD;  Location: [facility];  Service: Thoracic;  Laterality: N/A;  . LUNG BIOPSY N/A 02/20/2019   Procedure: LUNG BIOPSY;  Surgeon: Grace Isaac, MD;  Location: [facility];  Service: Thoracic;  Laterality: N/A;  . TONSILLECTOMY    . VIDEO BRONCHOSCOPY WITH ENDOBRONCHIAL ULTRASOUND N/A 10/19/2016   Procedure: VIDEO BRONCHOSCOPY WITH ENDOBRONCHIAL ULTRASOUND;  Surgeon: Grace Isaac, MD;  Location: [facility];  Service: Thoracic;  Laterality: N/A;  . VIDEO BRONCHOSCOPY WITH ENDOBRONCHIAL ULTRASOUND N/A 02/20/2019   Procedure: VIDEO BRONCHOSCOPY WITH ENDOBRONCHIAL ULTRASOUND;  Surgeon: Grace Isaac, MD;  Location: [facility];  Service: Thoracic;  Laterality: N/A;    REVIEW OF SYSTEMS:  A comprehensive review of systems was negative except for: Constitutional: positive for fatigue Respiratory: positive for dyspnea on exertion   PHYSICAL EXAMINATION: General appearance: alert, cooperative, fatigued and no distress Head: Normocephalic, without obvious abnormality, atraumatic Neck: no adenopathy, no JVD, supple, symmetrical, trachea midline and thyroid not enlarged, symmetric, no tenderness/mass/nodules Lymph nodes: Cervical, supraclavicular, and axillary nodes normal. Resp: clear to auscultation bilaterally Back: symmetric, no curvature. ROM normal. No CVA tenderness. Cardio: regular rate and rhythm, S1, S2 normal, no murmur, click, rub or gallop GI: soft, non-tender; bowel sounds normal; no masses,  no organomegaly Extremities: extremities normal, atraumatic, no cyanosis or edema  ECOG PERFORMANCE STATUS: 1 - Symptomatic but  completely ambulatory  Blood pressure 121/88, pulse (!) 110, temperature 98.5 F (36.9 C), temperature source Oral, resp. rate 18, height 5\' 6"  (1.676 m), weight 212 lb 14.4 oz (96.6 kg), SpO2 96 %.  LABORATORY DATA: Lab Results  Component Value Date   WBC 5.9 06/18/2019   HGB 11.0 (L) 06/18/2019   HCT 33.5 (L) 06/18/2019   MCV 101.5 (H) 06/18/2019   PLT 260 06/18/2019      Chemistry      Component Value Date/Time   NA 141 05/28/2019 1105   NA 139 11/24/2017 0913   K 3.3 (L) 05/28/2019 1105   K 3.8 11/24/2017 0913   CL 104 05/28/2019 1105   CO2 26 05/28/2019 1105   CO2 24 11/24/2017 0913   BUN 11 05/28/2019 1105   BUN 22.1 11/24/2017 0913   CREATININE 1.32 (H) 05/28/2019 1105   CREATININE 1.2 (H) 11/24/2017 0913      Component Value Date/Time   CALCIUM 9.0 05/28/2019 1105   CALCIUM  9.7 11/24/2017 0913   ALKPHOS 107 05/28/2019 1105   ALKPHOS 99 11/24/2017 0913   AST 12 (L) 05/28/2019 1105   AST 15 11/24/2017 0913   ALT 7 05/28/2019 1105   ALT 7 11/24/2017 0913   BILITOT 0.3 05/28/2019 1105   BILITOT 0.37 11/24/2017 0913       RADIOGRAPHIC STUDIES: No results found.  ASSESSMENT AND PLAN:  This is a very pleasant 68 years old white female with recurrent small cell lung cancer that was initially diagnosed as limited stage disease status post a course of systemic chemotherapy with cisplatin and etoposide for 6 cycles concurrent with radiation. She tolerated her treatment well except for fatigue. She is also status post prophylactic cranial irradiation. The patient has been in observation since March 2018. She was found on recent imaging studies to have evidence for disease recurrence with right lower lobe pulmonary nodules in addition to mediastinal lymphadenopathy. Repeat bronchoscopy with endobronchial ultrasound and biopsy confirmed recurrence of his small cell lung cancer. The patient was started on systemic chemotherapy with carboplatin, etoposide and Tecentriq  status post 5 cycles.  She has partial response after cycle #4 and starting from cycle #5 the patient is currently on maintenance treatment with Tecentriq.  She tolerated the first cycle of her treatment well. I recommended for the patient to proceed with cycle #6 today as planned. I will see her back for follow-up visit in 3 weeks for evaluation before the next cycle of her treatment. She was advised to call immediately if she has any concerning symptoms in the interval. The patient voices understanding of current disease status and treatment options and is in agreement with the current care plan.  All questions were answered. The patient knows to call the clinic with any problems, questions or concerns. We can certainly see the patient much sooner if necessary.  Disclaimer: This note was dictated with voice recognition software. Similar sounding words can inadvertently be transcribed and may not be corrected upon review.

## 2019-06-18 NOTE — Patient Instructions (Signed)
[facility] Discharge Instructions for Patients Receiving Chemotherapy  Today you received the following chemotherapy agents: Atezolizumab Gildardo Pounds)  To help prevent nausea and vomiting after your treatment, we encourage you to take your nausea medication as directed.    If you develop nausea and vomiting that is not controlled by your nausea medication, call the clinic.   BELOW ARE SYMPTOMS THAT SHOULD BE REPORTED IMMEDIATELY:  *FEVER GREATER THAN 100.5 F  *CHILLS WITH OR WITHOUT FEVER  NAUSEA AND VOMITING THAT IS NOT CONTROLLED WITH YOUR NAUSEA MEDICATION  *UNUSUAL SHORTNESS OF BREATH  *UNUSUAL BRUISING OR BLEEDING  TENDERNESS IN MOUTH AND THROAT WITH OR WITHOUT PRESENCE OF ULCERS  *URINARY PROBLEMS  *BOWEL PROBLEMS  UNUSUAL RASH Items with * indicate a potential emergency and should be followed up as soon as possible.  Feel free to call the clinic should you have any questions or concerns. The clinic phone number is [phone].  Please show the [facility] at check-in to the Emergency Department and triage nurse.  Coronavirus (COVID-19) Are you at risk?  Are you at risk for the Coronavirus (COVID-19)?  To be considered HIGH RISK for Coronavirus (COVID-19), you have to meet the following criteria:  . Traveled to [place], [place], [place], [place] or [place]; or in the [place] to [place], [place], [place], or [place]; and have fever, cough, and shortness of breath within the last 2 weeks of travel OR . Been in close contact with a person diagnosed with COVID-19 within the last 2 weeks and have fever, cough, and shortness of breath . IF YOU DO NOT MEET THESE CRITERIA, YOU ARE CONSIDERED LOW RISK FOR COVID-19.  What to do if you are HIGH RISK for COVID-19?  Marland Kitchen If you are having a medical emergency, call 911. . Seek medical care right away. Before you go to a doctor's office, urgent care or emergency department, call ahead and tell them  about your recent travel, contact with someone diagnosed with COVID-19, and your symptoms. You should receive instructions from your physician's office regarding next steps of care.  . When you arrive at healthcare provider, tell the healthcare staff immediately you have returned from visiting [place], [place], [place], [place] or [place]; or traveled in the [place] to [place], [place], [place], or [place]; in the last two weeks or you have been in close contact with a person diagnosed with COVID-19 in the last 2 weeks.   . Tell the health care staff about your symptoms: fever, cough and shortness of breath. . After you have been seen by a medical provider, you will be either: o Tested for (COVID-19) and discharged home on quarantine except to seek medical care if symptoms worsen, and asked to  - Stay home and avoid contact with others until you get your results (4-5 days)  - Avoid travel on public transportation if possible (such as bus, train, or airplane) or o Sent to the Emergency Department by EMS for evaluation, COVID-19 testing, and possible admission depending on your condition and test results.  What to do if you are LOW RISK for COVID-19?  Reduce your risk of any infection by using the same precautions used for avoiding the common cold or flu:  Marland Kitchen Wash your hands often with soap and warm water for at least 20 seconds.  If soap and water are not readily available, use an alcohol-based hand sanitizer with at least 60% alcohol.  . If coughing  or sneezing, cover your mouth and nose by coughing or sneezing into the elbow areas of your shirt or coat, into a tissue or into your sleeve (not your hands). . Avoid shaking hands with others and consider head nods or verbal greetings only. . Avoid touching your eyes, nose, or mouth with unwashed hands.  . Avoid close contact with people who are sick. . Avoid places or events with large numbers of people in one location, like concerts or  sporting events. . Carefully consider travel plans you have or are making. . If you are planning any travel outside or inside the [place], visit the CDC's Travelers' Health webpage for the latest health notices. . If you have some symptoms but not all symptoms, continue to monitor at home and seek medical attention if your symptoms worsen. . If you are having a medical emergency, call 911.   [place] / e-Visit: [email or web address]         [facility]: [facility]: [phone]                   [facility]: [phone]

## 2019-07-08 ENCOUNTER — Telehealth: Payer: Self-pay | Admitting: *Deleted

## 2019-07-08 ENCOUNTER — Other Ambulatory Visit: Payer: Self-pay | Admitting: Internal Medicine

## 2019-07-08 DIAGNOSIS — C3491 Malignant neoplasm of unspecified part of right bronchus or lung: Secondary | ICD-10-CM

## 2019-07-08 NOTE — Telephone Encounter (Signed)
Husband Laurenashley Viar called requesting a referral from Dr. Julien Nordmann for pt to see an oncologist at [facility] -  Dr.  Larose Kells. Dr. Larose Kells    Phone     [phone]     ;     Fax      [phone].

## 2019-07-09 ENCOUNTER — Inpatient Hospital Stay: Payer: Medicare Other | Admitting: Internal Medicine

## 2019-07-09 ENCOUNTER — Inpatient Hospital Stay: Payer: Medicare Other

## 2019-07-09 ENCOUNTER — Encounter: Payer: Self-pay | Admitting: Physician Assistant

## 2019-07-09 ENCOUNTER — Inpatient Hospital Stay (HOSPITAL_BASED_OUTPATIENT_CLINIC_OR_DEPARTMENT_OTHER): Payer: Medicare Other | Admitting: Physician Assistant

## 2019-07-09 ENCOUNTER — Telehealth: Payer: Self-pay | Admitting: Internal Medicine

## 2019-07-09 ENCOUNTER — Other Ambulatory Visit: Payer: Self-pay

## 2019-07-09 ENCOUNTER — Inpatient Hospital Stay: Payer: Medicare Other | Attending: Family Medicine

## 2019-07-09 VITALS — BP 144/93 | HR 103 | Temp 99.6°F | Resp 22 | Ht 66.0 in | Wt 208.6 lb

## 2019-07-09 DIAGNOSIS — Z5112 Encounter for antineoplastic immunotherapy: Secondary | ICD-10-CM | POA: Diagnosis not present

## 2019-07-09 DIAGNOSIS — Z79899 Other long term (current) drug therapy: Secondary | ICD-10-CM

## 2019-07-09 DIAGNOSIS — Z7951 Long term (current) use of inhaled steroids: Secondary | ICD-10-CM | POA: Diagnosis not present

## 2019-07-09 DIAGNOSIS — C3411 Malignant neoplasm of upper lobe, right bronchus or lung: Secondary | ICD-10-CM

## 2019-07-09 DIAGNOSIS — C3491 Malignant neoplasm of unspecified part of right bronchus or lung: Secondary | ICD-10-CM

## 2019-07-09 DIAGNOSIS — R5382 Chronic fatigue, unspecified: Secondary | ICD-10-CM

## 2019-07-09 DIAGNOSIS — J449 Chronic obstructive pulmonary disease, unspecified: Secondary | ICD-10-CM | POA: Insufficient documentation

## 2019-07-09 DIAGNOSIS — Z791 Long term (current) use of non-steroidal anti-inflammatories (NSAID): Secondary | ICD-10-CM | POA: Insufficient documentation

## 2019-07-09 DIAGNOSIS — Z95828 Presence of other vascular implants and grafts: Secondary | ICD-10-CM

## 2019-07-09 LAB — TSH: TSH: 0.442 u[IU]/mL (ref 0.308–3.960)

## 2019-07-09 LAB — CBC WITH DIFFERENTIAL (CANCER CENTER ONLY)
Abs Immature Granulocytes: 0.01 10*3/uL (ref 0.00–0.07)
Basophils Absolute: 0.1 10*3/uL (ref 0.0–0.1)
Basophils Relative: 1 %
Eosinophils Absolute: 0.2 10*3/uL (ref 0.0–0.5)
Eosinophils Relative: 2 %
HCT: 38.3 % (ref 36.0–46.0)
Hemoglobin: 12.7 g/dL (ref 12.0–15.0)
Immature Granulocytes: 0 %
Lymphocytes Relative: 13 %
Lymphs Abs: 0.9 10*3/uL (ref 0.7–4.0)
MCH: 32.6 pg (ref 26.0–34.0)
MCHC: 33.2 g/dL (ref 30.0–36.0)
MCV: 98.2 fL (ref 80.0–100.0)
Monocytes Absolute: 0.5 10*3/uL (ref 0.1–1.0)
Monocytes Relative: 7 %
Neutro Abs: 5.4 10*3/uL (ref 1.7–7.7)
Neutrophils Relative %: 77 %
Platelet Count: 256 10*3/uL (ref 150–400)
RBC: 3.9 MIL/uL (ref 3.87–5.11)
RDW: 12 % (ref 11.5–15.5)
WBC Count: 7 10*3/uL (ref 4.0–10.5)
nRBC: 0 % (ref 0.0–0.2)

## 2019-07-09 LAB — CMP (CANCER CENTER ONLY)
ALT: 9 U/L (ref 0–44)
AST: 13 U/L — ABNORMAL LOW (ref 15–41)
Albumin: 3.4 g/dL — ABNORMAL LOW (ref 3.5–5.0)
Alkaline Phosphatase: 100 U/L (ref 38–126)
Anion gap: 15 (ref 5–15)
BUN: 19 mg/dL (ref 8–23)
CO2: 26 mmol/L (ref 22–32)
Calcium: 9.4 mg/dL (ref 8.9–10.3)
Chloride: 103 mmol/L (ref 98–111)
Creatinine: 1.1 mg/dL — ABNORMAL HIGH (ref 0.44–1.00)
GFR, Est AFR Am: >60 mL/min (ref >60)
GFR, Estimated: 52 mL/min — ABNORMAL LOW (ref >60)
Glucose, Bld: 70 mg/dL (ref 70–99)
Potassium: 3.5 mmol/L (ref 3.5–5.1)
Sodium: 144 mmol/L (ref 135–145)
Total Bilirubin: 0.2 mg/dL — ABNORMAL LOW (ref 0.3–1.2)
Total Protein: 6.3 g/dL — ABNORMAL LOW (ref 6.5–8.1)

## 2019-07-09 MED ORDER — SODIUM CHLORIDE 0.9% FLUSH
10.0000 mL | INTRAVENOUS | Status: DC | PRN
  Administered 2019-07-09: 10 mL
  Filled 2019-07-09: qty 10

## 2019-07-09 MED ORDER — SODIUM CHLORIDE 0.9% FLUSH
10.0000 mL | Freq: Once | INTRAVENOUS | Status: AC
  Administered 2019-07-09: 10 mL
  Filled 2019-07-09: qty 10

## 2019-07-09 MED ORDER — SODIUM CHLORIDE 0.9 % IV SOLN
Freq: Once | INTRAVENOUS | Status: AC
  Administered 2019-07-09: 14:00:00 via INTRAVENOUS
  Filled 2019-07-09: qty 250

## 2019-07-09 MED ORDER — HEPARIN SOD (PORK) LOCK FLUSH 100 UNIT/ML IV SOLN
500.0000 [IU] | Freq: Once | INTRAVENOUS | Status: AC | PRN
  Administered 2019-07-09: 500 [IU]
  Filled 2019-07-09: qty 5

## 2019-07-09 MED ORDER — SODIUM CHLORIDE 0.9 % IV SOLN
1200.0000 mg | Freq: Once | INTRAVENOUS | Status: AC
  Administered 2019-07-09: 1200 mg via INTRAVENOUS
  Filled 2019-07-09: qty 20

## 2019-07-09 NOTE — Progress Notes (Signed)
[facility] OFFICE PROGRESS NOTE  Koirala, Dibas, MD [address] [address]  DIAGNOSIS: Recurrent small cell lung cancer initially diagnosed as Limited stage (T2b, N2, M0) small cell lung cancer diagnosed in November 2017 and presented with large right hilar mass with mediastinal invasion and a right lower lobe pulmonary nodule.  The patient had disease recurrence in the right lower lobe and mediastinal lymph nodes in February 2020.  PRIOR THERAPY: 1) Systemic chemotherapy with cisplatin 60 MG/M2 on day 1 and etoposide 120 MG/M2 on days 1, 2 and 3 concurrent with radiation status post 6 cycles.  Last dose was giving March 06, 2017. 2) status post prophylactic cranial irradiation.  CURRENT THERAPY: Systemic chemotherapy with carboplatin for AUC of 5 on day 1, etoposide.  100 mg/M2 on days 1, 2 and 3 as well as Tecentriq 1200 mg IV with Neulasta support every 3 weeks.  First dose March 05, 2019.  Status post 6 cycles.  Starting from cycle 5 the patient will be treated with single agent Tecentriq 1200 mg IV every 3 weeks.   INTERVAL HISTORY: Tammy Bowers 68 y.o. female returns to the clinic for a follow up visit. The patient is feeling well today without any concerning complaints except fatigue. She is relocating to [place] on 07/16/2019 until October 2020. She has established care with Dr. Larose Kells for this duration of time to manage her treatments for her lung cancer. She has an appointment with her next week.   Otherwise she has been tolerating her treatment with Immunotherapy well without any adverse effects except for dry skin. She also reports an unusual adverse side effect of having difficulty achieving sexual pleasure which she attributes to her treatment with immunotherapy. She denies any fever, chills, night sweats, or weight loss. She denies any chest pain, shortness of breath, cough, or hemoptysis. She denies any nausea, vomiting, diarrhea, or  constipation. She denies any headaches buts he states her "eyes wiggle", which according to her, has been an ongoing issue for several years. She denies any double vision or visual field defects. She is here today for evaluation before starting cycle #7.   MEDICAL HISTORY: Past Medical History:  Diagnosis Date  . Adenopathy 10/10/2016   PERICARINAL  . Anemia   . Arthritis   . Asthma   . Cancer ([facility])    lung cancer Small Cell  . COPD (chronic obstructive pulmonary disease) ([facility])   . Dyspnea   . Headache    migraines  . Hilar mass 10/10/2016   RIGHT  . History of kidney stones   . Insomnia   . Lung nodules 10/10/2016   RIGHT LOWER LOBE  . Mass of both adrenal glands ([facility]) 10/10/2016  . Mood swing   . Pneumonia   . Spinal headache   . Substance abuse ([facility])    TOBACCO  . UNSPECIFIED INFECTION OF BONE ANKLE AND FOOT 10/22/2009   Annotation: left ankle Qualifier: Diagnosis of  By: Patsy Baltimore RN, Denise      ALLERGIES:  is allergic to lidocaine.  MEDICATIONS:  Current Outpatient Medications  Medication Sig Dispense Refill  . albuterol (PROVENTIL HFA;VENTOLIN HFA) 108 (90 BASE) MCG/ACT inhaler Inhale 2 puffs into the lungs every 4 (four) hours as needed for wheezing.    . Cyclobenzaprine HCl (FLEXERIL PO) Take 5 mg by mouth 3 (three) times daily as needed (muscle spasms).     . diphenoxylate-atropine (LOMOTIL) 2.5-0.025 MG tablet Take 1 tablet by mouth 4 (  four) times daily as needed for diarrhea or loose stools. 30 tablet 0  . Fluticasone-Salmeterol (ADVAIR) 500-50 MCG/DOSE AEPB Inhale 1 puff into the lungs 2 (two) times daily.    Marland Kitchen ibuprofen (ADVIL) 800 MG tablet Take 800 mg by mouth every 8 (eight) hours as needed.    Marland Kitchen LORazepam (ATIVAN) 0.5 MG tablet Take 1 tablet (0.5 mg total) by mouth at bedtime. (Patient taking differently: Take 0.5 mg by mouth at bedtime as needed for anxiety. ) 20 tablet 0  . Multiple Vitamin (MULTIVITAMIN WITH MINERALS) TABS tablet Take 1 tablet by mouth  daily.     . prochlorperazine (COMPAZINE) 10 MG tablet TAKE 1 TABLET(10 MG) BY MOUTH EVERY 6 HOURS AS NEEDED FOR NAUSEA OR VOMITING 30 tablet 0  . sertraline (ZOLOFT) 50 MG tablet Take 50 mg by mouth at bedtime.  5   No current facility-administered medications for this visit.    Facility-Administered Medications Ordered in Other Visits  Medication Dose Route Frequency Provider Last Rate Last Dose  . atezolizumab (TECENTRIQ) 1,200 mg in sodium chloride 0.9 % 250 mL chemo infusion  1,200 mg Intravenous Once Curt Bears, MD 540 mL/hr at 07/09/19 1419 1,200 mg at 07/09/19 1419  . heparin lock flush 100 unit/mL  500 Units Intracatheter Once PRN Curt Bears, MD      . pegfilgrastim-cbqv [facility]) injection 6 mg  6 mg Subcutaneous Once Curt Bears, MD      . sodium chloride flush (NS) 0.9 % injection 10 mL  10 mL Intracatheter PRN Curt Bears, MD        SURGICAL HISTORY:  Past Surgical History:  Procedure Laterality Date  . ANKLE SURGERY     hardware removal, and initial ankle surgery  . APPENDECTOMY    . IR IMAGING GUIDED PORT INSERTION  04/12/2019  . LUNG BIOPSY N/A 10/19/2016   Procedure: LUNG BIOPSY;  Surgeon: Grace Isaac, MD;  Location: [facility];  Service: Thoracic;  Laterality: N/A;  . LUNG BIOPSY N/A 02/20/2019   Procedure: LUNG BIOPSY;  Surgeon: Grace Isaac, MD;  Location: [facility];  Service: Thoracic;  Laterality: N/A;  . TONSILLECTOMY    . VIDEO BRONCHOSCOPY WITH ENDOBRONCHIAL ULTRASOUND N/A 10/19/2016   Procedure: VIDEO BRONCHOSCOPY WITH ENDOBRONCHIAL ULTRASOUND;  Surgeon: Grace Isaac, MD;  Location: [facility];  Service: Thoracic;  Laterality: N/A;  . VIDEO BRONCHOSCOPY WITH ENDOBRONCHIAL ULTRASOUND N/A 02/20/2019   Procedure: VIDEO BRONCHOSCOPY WITH ENDOBRONCHIAL ULTRASOUND;  Surgeon: Grace Isaac, MD;  Location: [facility];  Service: Thoracic;  Laterality: N/A;    REVIEW OF SYSTEMS:   Review of Systems  Constitutional: Positive for fatigue. Negative for  appetite change, chills, fever and unexpected weight change.  HENT: Negative for mouth sores, nosebleeds, sore throat and trouble swallowing.   Eyes: Negative for icterus.  Respiratory: Negative for cough, hemoptysis, shortness of breath and wheezing.   Cardiovascular: Negative for chest pain and leg swelling.  Gastrointestinal: Negative for abdominal pain, constipation, diarrhea, nausea and vomiting.  Genitourinary: Negative for bladder incontinence, difficulty urinating, dysuria, frequency and hematuria.  Musculoskeletal: Negative for back pain, gait problem, neck pain and neck stiffness.  Skin: Positive for dry skin. Negative for itching and rash.  Neurological: Negative for dizziness, extremity weakness, gait problem, headaches, light-headedness and seizures.  Hematological: Negative for adenopathy. Does not bruise/bleed easily.  Psychiatric/Behavioral: Negative for confusion, depression and sleep disturbance. The patient is not nervous/anxious.     PHYSICAL EXAMINATION:  Blood pressure (!) 144/93, pulse (!) 103, temperature  99.6 F (37.6 C), temperature source Oral, resp. rate (!) 22, height 5\' 6"  (1.676 m), weight 208 lb 9.6 oz (94.6 kg), SpO2 97 %.  ECOG PERFORMANCE STATUS: 1 - Symptomatic but completely ambulatory  Physical Exam  Constitutional: Oriented to person, place, and time and well-developed, well-nourished, and in no distress.  HENT:  Head: Normocephalic and atraumatic.  Mouth/Throat: Oropharynx is clear and moist. No oropharyngeal exudate.  Eyes: Conjunctivae are normal. Right eye exhibits no discharge. Left eye exhibits no discharge. No scleral icterus.  Neck: Normal range of motion. Neck supple.  Cardiovascular: Normal rate, regular rhythm, normal heart sounds and intact distal pulses.   Pulmonary/Chest: Effort normal and breath sounds normal. No respiratory distress. No wheezes. No rales.  Abdominal: Soft. Bowel sounds are normal. Exhibits no distension and no mass.  There is no tenderness.  Musculoskeletal: Normal range of motion. Exhibits no edema.  Lymphadenopathy:    No cervical adenopathy.  Neurological: Alert and oriented to person, place, and time. Exhibits normal muscle tone. Gait normal. Coordination normal.  Skin: Skin is warm and dry. No rash noted. Not diaphoretic. No erythema. No pallor.  Psychiatric: Mood, memory and judgment normal.  Vitals reviewed.  LABORATORY DATA: Lab Results  Component Value Date   WBC 7.0 07/09/2019   HGB 12.7 07/09/2019   HCT 38.3 07/09/2019   MCV 98.2 07/09/2019   PLT 256 07/09/2019      Chemistry      Component Value Date/Time   NA 144 07/09/2019 1156   NA 139 11/24/2017 0913   K 3.5 07/09/2019 1156   K 3.8 11/24/2017 0913   CL 103 07/09/2019 1156   CO2 26 07/09/2019 1156   CO2 24 11/24/2017 0913   BUN 19 07/09/2019 1156   BUN 22.1 11/24/2017 0913   CREATININE 1.10 (H) 07/09/2019 1156   CREATININE 1.2 (H) 11/24/2017 0913      Component Value Date/Time   CALCIUM 9.4 07/09/2019 1156   CALCIUM 9.7 11/24/2017 0913   ALKPHOS 100 07/09/2019 1156   ALKPHOS 99 11/24/2017 0913   AST 13 (L) 07/09/2019 1156   AST 15 11/24/2017 0913   ALT 9 07/09/2019 1156   ALT 7 11/24/2017 0913   BILITOT 0.2 (L) 07/09/2019 1156   BILITOT 0.37 11/24/2017 0913       RADIOGRAPHIC STUDIES:  No results found.   ASSESSMENT/PLAN:  This is a very pleasant 68 year old Caucasian female with recurrent small cell lung cancer.  She was initially diagnosed as limited stage (T2b, N2, M0) in 2017.  She presented with a large right hilar mass with mediastinal invasion and a right lower lobe pulmonary nodule.  She is status post a course of systemic chemotherapy with cisplatin and etoposide for 6 cycles with concurrent radiation.  She tolerated treatment well except for fatigue.  She also received prophylactic cranial irradiation.  She had been on observation since March 2018.  Recent imaging studies from February 2020  showed evidence of disease recurrence within right lower lobe pulmonary nodules in addition to mediastinal lymphadenopathy.  A repeat bronchoscopy with endobronchial ultrasound and biopsy confirmed recurrence of small cell lung cancer.   She is currently undergoing systemic chemotherapy with carboplatin, etoposide, and Tecentriq.  She is status post 6 cycles.  Starting from cycle #5 the patient has been receiving single agent maintenance Tecentriq.  She tolerated treatment well except for itching and fatigue.   The patient was seen with Dr. Julien Nordmann today.  Labs were reviewed.  We recommend that she  proceed with cycle #7 today as scheduled.  The patient will continue receiving her treatments in [place] under the care of Dr. Lockie Pares. She has an appointment scheduled with her next week.   We will see the patient back in October when she returns to [place] to resume her care with our clinic.  The patient knows to call us to schedule future appointments when she is ready to resume treatment here.  The patient was advised to call immediately if she has any concerning symptoms in the interval. The patient voices understanding of current disease status and treatment options and is in agreement with the current care plan. All questions were answered. The patient knows to call the clinic with any problems, questions or concerns. We can certainly see the patient much sooner if necessary  No orders of the defined types were placed in this encounter.     L , PA-C 07/09/19  ADDENDUM: Hematology/Oncology Attending: I had a face-to-face encounter with the patient today.  I recommended her care plan.  This is a very pleasant 68 years old white female with recurrent small cell lung cancer treated recently with systemic chemotherapy with carboplatin, etoposide and Tecentriq.  She is currently on maintenance treatment with Tecentriq status post 2 cycles of the maintenance treatment. The  patient is feeling fine today with no concerning complaints. I recommended for her to proceed with cycle #7 today as planned. She is supposed to travel to [place] to stay with her husband and she requested referral to Dr. Larose Kells for evaluation and treatment while she is a staying in [place]. We will see the patient for follow-up after she returns back home to [place]. The patient was advised to call immediately if she has any other concerning symptoms in the interval.  Disclaimer: This note was dictated with voice recognition software. Similar sounding words can inadvertently be transcribed and may be missed upon review. Eilleen Kempf, MD 07/09/19

## 2019-07-09 NOTE — Telephone Encounter (Signed)
FAXED MEDICAL RECORDS TO  DR Cord[facility] [phone]

## 2019-07-09 NOTE — Patient Instructions (Signed)
[facility] Discharge Instructions for Patients Receiving Chemotherapy  Today you received the following chemotherapy agents: Atezolizumab Gildardo Pounds)  To help prevent nausea and vomiting after your treatment, we encourage you to take your nausea medication as directed.    If you develop nausea and vomiting that is not controlled by your nausea medication, call the clinic.   BELOW ARE SYMPTOMS THAT SHOULD BE REPORTED IMMEDIATELY:  *FEVER GREATER THAN 100.5 F  *CHILLS WITH OR WITHOUT FEVER  NAUSEA AND VOMITING THAT IS NOT CONTROLLED WITH YOUR NAUSEA MEDICATION  *UNUSUAL SHORTNESS OF BREATH  *UNUSUAL BRUISING OR BLEEDING  TENDERNESS IN MOUTH AND THROAT WITH OR WITHOUT PRESENCE OF ULCERS  *URINARY PROBLEMS  *BOWEL PROBLEMS  UNUSUAL RASH Items with * indicate a potential emergency and should be followed up as soon as possible.  Feel free to call the clinic should you have any questions or concerns. The clinic phone number is [phone].  Please show the [facility] at check-in to the Emergency Department and triage nurse.  Coronavirus (COVID-19) Are you at risk?  Are you at risk for the Coronavirus (COVID-19)?  To be considered HIGH RISK for Coronavirus (COVID-19), you have to meet the following criteria:  . Traveled to [place], [place], [place], [place] or [place]; or in the [place] to [place], [place], [place], or [place]; and have fever, cough, and shortness of breath within the last 2 weeks of travel OR . Been in close contact with a person diagnosed with COVID-19 within the last 2 weeks and have fever, cough, and shortness of breath . IF YOU DO NOT MEET THESE CRITERIA, YOU ARE CONSIDERED LOW RISK FOR COVID-19.  What to do if you are HIGH RISK for COVID-19?  Marland Kitchen If you are having a medical emergency, call 911. . Seek medical care right away. Before you go to a doctor's office, urgent care or emergency department, call ahead and tell them  about your recent travel, contact with someone diagnosed with COVID-19, and your symptoms. You should receive instructions from your physician's office regarding next steps of care.  . When you arrive at healthcare provider, tell the healthcare staff immediately you have returned from visiting [place], [place], [place], [place] or [place]; or traveled in the [place] to [place], [place], [place], or [place]; in the last two weeks or you have been in close contact with a person diagnosed with COVID-19 in the last 2 weeks.   . Tell the health care staff about your symptoms: fever, cough and shortness of breath. . After you have been seen by a medical provider, you will be either: o Tested for (COVID-19) and discharged home on quarantine except to seek medical care if symptoms worsen, and asked to  - Stay home and avoid contact with others until you get your results (4-5 days)  - Avoid travel on public transportation if possible (such as bus, train, or airplane) or o Sent to the Emergency Department by EMS for evaluation, COVID-19 testing, and possible admission depending on your condition and test results.  What to do if you are LOW RISK for COVID-19?  Reduce your risk of any infection by using the same precautions used for avoiding the common cold or flu:  Marland Kitchen Wash your hands often with soap and warm water for at least 20 seconds.  If soap and water are not readily available, use an alcohol-based hand sanitizer with at least 60% alcohol.  . If coughing  or sneezing, cover your mouth and nose by coughing or sneezing into the elbow areas of your shirt or coat, into a tissue or into your sleeve (not your hands). . Avoid shaking hands with others and consider head nods or verbal greetings only. . Avoid touching your eyes, nose, or mouth with unwashed hands.  . Avoid close contact with people who are sick. . Avoid places or events with large numbers of people in one location, like concerts or  sporting events. . Carefully consider travel plans you have or are making. . If you are planning any travel outside or inside the [place], visit the CDC's Travelers' Health webpage for the latest health notices. . If you have some symptoms but not all symptoms, continue to monitor at home and seek medical attention if your symptoms worsen. . If you are having a medical emergency, call 911.   [facility] / e-Visit: [email or web address]         [facility]: [facility]: [phone]                   [facility]: [phone]

## 2019-07-18 DIAGNOSIS — C3490 Malignant neoplasm of unspecified part of unspecified bronchus or lung: Secondary | ICD-10-CM | POA: Insufficient documentation

## 2019-07-30 ENCOUNTER — Ambulatory Visit: Payer: Medicare Other | Admitting: Internal Medicine

## 2019-07-30 ENCOUNTER — Ambulatory Visit: Payer: Medicare Other

## 2019-07-30 ENCOUNTER — Other Ambulatory Visit: Payer: Medicare Other

## 2019-07-30 DIAGNOSIS — R112 Nausea with vomiting, unspecified: Secondary | ICD-10-CM | POA: Diagnosis not present

## 2019-07-30 DIAGNOSIS — T451X5A Adverse effect of antineoplastic and immunosuppressive drugs, initial encounter: Secondary | ICD-10-CM | POA: Diagnosis not present

## 2019-07-30 DIAGNOSIS — Z5111 Encounter for antineoplastic chemotherapy: Secondary | ICD-10-CM | POA: Diagnosis not present

## 2019-07-30 DIAGNOSIS — F5231 Female orgasmic disorder: Secondary | ICD-10-CM | POA: Insufficient documentation

## 2019-07-30 DIAGNOSIS — C3490 Malignant neoplasm of unspecified part of unspecified bronchus or lung: Secondary | ICD-10-CM | POA: Diagnosis not present

## 2019-07-30 DIAGNOSIS — E870 Hyperosmolality and hypernatremia: Secondary | ICD-10-CM | POA: Diagnosis not present

## 2019-08-20 ENCOUNTER — Ambulatory Visit: Payer: Medicare Other | Admitting: Internal Medicine

## 2019-08-20 ENCOUNTER — Other Ambulatory Visit: Payer: Medicare Other

## 2019-08-20 ENCOUNTER — Ambulatory Visit: Payer: Medicare Other

## 2019-08-20 DIAGNOSIS — R112 Nausea with vomiting, unspecified: Secondary | ICD-10-CM | POA: Diagnosis not present

## 2019-08-20 DIAGNOSIS — T451X5A Adverse effect of antineoplastic and immunosuppressive drugs, initial encounter: Secondary | ICD-10-CM | POA: Diagnosis not present

## 2019-08-20 DIAGNOSIS — Z5111 Encounter for antineoplastic chemotherapy: Secondary | ICD-10-CM | POA: Diagnosis not present

## 2019-08-20 DIAGNOSIS — C3490 Malignant neoplasm of unspecified part of unspecified bronchus or lung: Secondary | ICD-10-CM | POA: Diagnosis not present

## 2019-09-10 DIAGNOSIS — Z5111 Encounter for antineoplastic chemotherapy: Secondary | ICD-10-CM | POA: Diagnosis not present

## 2019-09-10 DIAGNOSIS — R112 Nausea with vomiting, unspecified: Secondary | ICD-10-CM | POA: Diagnosis not present

## 2019-09-10 DIAGNOSIS — T451X5A Adverse effect of antineoplastic and immunosuppressive drugs, initial encounter: Secondary | ICD-10-CM | POA: Diagnosis not present

## 2019-09-10 DIAGNOSIS — C3490 Malignant neoplasm of unspecified part of unspecified bronchus or lung: Secondary | ICD-10-CM | POA: Diagnosis not present

## 2019-09-16 DIAGNOSIS — C3411 Malignant neoplasm of upper lobe, right bronchus or lung: Secondary | ICD-10-CM | POA: Diagnosis not present

## 2019-09-16 DIAGNOSIS — E278 Other specified disorders of adrenal gland: Secondary | ICD-10-CM | POA: Diagnosis not present

## 2019-09-16 DIAGNOSIS — C3490 Malignant neoplasm of unspecified part of unspecified bronchus or lung: Secondary | ICD-10-CM | POA: Diagnosis not present

## 2019-09-16 DIAGNOSIS — E279 Disorder of adrenal gland, unspecified: Secondary | ICD-10-CM | POA: Diagnosis not present

## 2019-10-01 DIAGNOSIS — Z0289 Encounter for other administrative examinations: Secondary | ICD-10-CM | POA: Diagnosis not present

## 2019-10-01 DIAGNOSIS — Z79899 Other long term (current) drug therapy: Secondary | ICD-10-CM | POA: Diagnosis not present

## 2019-10-01 DIAGNOSIS — R200 Anesthesia of skin: Secondary | ICD-10-CM | POA: Diagnosis not present

## 2019-10-01 DIAGNOSIS — R29818 Other symptoms and signs involving the nervous system: Secondary | ICD-10-CM | POA: Diagnosis not present

## 2019-10-01 DIAGNOSIS — I6782 Cerebral ischemia: Secondary | ICD-10-CM | POA: Diagnosis not present

## 2019-10-01 DIAGNOSIS — G43109 Migraine with aura, not intractable, without status migrainosus: Secondary | ICD-10-CM | POA: Diagnosis not present

## 2019-10-01 DIAGNOSIS — Z20828 Contact with and (suspected) exposure to other viral communicable diseases: Secondary | ICD-10-CM | POA: Diagnosis not present

## 2019-10-02 MED ORDER — SODIUM CHLORIDE FLUSH 0.9 % IV SOLN
5.00 | INTRAVENOUS | Status: DC
Start: ? — End: 2019-10-02

## 2019-10-22 ENCOUNTER — Other Ambulatory Visit: Payer: Self-pay | Admitting: Internal Medicine

## 2019-10-22 ENCOUNTER — Telehealth: Payer: Self-pay | Admitting: *Deleted

## 2019-10-22 NOTE — Telephone Encounter (Signed)
Pt husband called states " we just back from [place] needs a follow up appt and treatment." Message forward to MD for review.

## 2019-10-23 ENCOUNTER — Telehealth: Payer: Self-pay | Admitting: *Deleted

## 2019-10-23 ENCOUNTER — Telehealth: Payer: Self-pay | Admitting: Internal Medicine

## 2019-10-23 NOTE — Telephone Encounter (Signed)
Call from Jenny Reichmann ( pt husband ) regarding an appt. Reviewed pt  appt date/time with Jenny Reichmann. No further concerns.

## 2019-10-23 NOTE — Telephone Encounter (Signed)
Scheduled appt per 11/3 sch message - unable to reach pt . Left message with appt date and time

## 2019-10-29 ENCOUNTER — Encounter: Payer: Self-pay | Admitting: Physician Assistant

## 2019-10-29 ENCOUNTER — Other Ambulatory Visit: Payer: Self-pay

## 2019-10-29 ENCOUNTER — Inpatient Hospital Stay: Payer: Medicare Other | Attending: Internal Medicine

## 2019-10-29 ENCOUNTER — Inpatient Hospital Stay (HOSPITAL_BASED_OUTPATIENT_CLINIC_OR_DEPARTMENT_OTHER): Payer: Medicare Other | Admitting: Physician Assistant

## 2019-10-29 ENCOUNTER — Inpatient Hospital Stay: Payer: Medicare Other

## 2019-10-29 VITALS — BP 115/72 | HR 98

## 2019-10-29 VITALS — BP 119/105 | HR 111 | Temp 98.2°F | Resp 18 | Ht 66.0 in | Wt 205.6 lb

## 2019-10-29 DIAGNOSIS — Z5112 Encounter for antineoplastic immunotherapy: Secondary | ICD-10-CM

## 2019-10-29 DIAGNOSIS — J45909 Unspecified asthma, uncomplicated: Secondary | ICD-10-CM | POA: Insufficient documentation

## 2019-10-29 DIAGNOSIS — C3491 Malignant neoplasm of unspecified part of right bronchus or lung: Secondary | ICD-10-CM

## 2019-10-29 DIAGNOSIS — M1990 Unspecified osteoarthritis, unspecified site: Secondary | ICD-10-CM | POA: Diagnosis not present

## 2019-10-29 DIAGNOSIS — Z923 Personal history of irradiation: Secondary | ICD-10-CM | POA: Diagnosis not present

## 2019-10-29 DIAGNOSIS — Z7951 Long term (current) use of inhaled steroids: Secondary | ICD-10-CM | POA: Insufficient documentation

## 2019-10-29 DIAGNOSIS — J449 Chronic obstructive pulmonary disease, unspecified: Secondary | ICD-10-CM | POA: Insufficient documentation

## 2019-10-29 DIAGNOSIS — R5382 Chronic fatigue, unspecified: Secondary | ICD-10-CM

## 2019-10-29 DIAGNOSIS — C3411 Malignant neoplasm of upper lobe, right bronchus or lung: Secondary | ICD-10-CM | POA: Diagnosis not present

## 2019-10-29 DIAGNOSIS — R5383 Other fatigue: Secondary | ICD-10-CM | POA: Diagnosis not present

## 2019-10-29 DIAGNOSIS — Z79899 Other long term (current) drug therapy: Secondary | ICD-10-CM | POA: Insufficient documentation

## 2019-10-29 DIAGNOSIS — C781 Secondary malignant neoplasm of mediastinum: Secondary | ICD-10-CM | POA: Insufficient documentation

## 2019-10-29 DIAGNOSIS — Z9221 Personal history of antineoplastic chemotherapy: Secondary | ICD-10-CM | POA: Insufficient documentation

## 2019-10-29 LAB — CMP (CANCER CENTER ONLY)
ALT: 8 U/L (ref 0–44)
AST: 13 U/L — ABNORMAL LOW (ref 15–41)
Albumin: 3.6 g/dL (ref 3.5–5.0)
Alkaline Phosphatase: 95 U/L (ref 38–126)
Anion gap: 11 (ref 5–15)
BUN: 26 mg/dL — ABNORMAL HIGH (ref 8–23)
CO2: 26 mmol/L (ref 22–32)
Calcium: 9.4 mg/dL (ref 8.9–10.3)
Chloride: 105 mmol/L (ref 98–111)
Creatinine: 1.14 mg/dL — ABNORMAL HIGH (ref 0.44–1.00)
GFR, Est AFR Am: 58 mL/min — ABNORMAL LOW (ref >60)
GFR, Estimated: 50 mL/min — ABNORMAL LOW (ref >60)
Glucose, Bld: 82 mg/dL (ref 70–99)
Potassium: 3.6 mmol/L (ref 3.5–5.1)
Sodium: 142 mmol/L (ref 135–145)
Total Bilirubin: 0.3 mg/dL (ref 0.3–1.2)
Total Protein: 6.9 g/dL (ref 6.5–8.1)

## 2019-10-29 LAB — CBC WITH DIFFERENTIAL (CANCER CENTER ONLY)
Abs Immature Granulocytes: 0.01 10*3/uL (ref 0.00–0.07)
Basophils Absolute: 0.1 10*3/uL (ref 0.0–0.1)
Basophils Relative: 1 %
Eosinophils Absolute: 0.1 10*3/uL (ref 0.0–0.5)
Eosinophils Relative: 2 %
HCT: 38.9 % (ref 36.0–46.0)
Hemoglobin: 13 g/dL (ref 12.0–15.0)
Immature Granulocytes: 0 %
Lymphocytes Relative: 15 %
Lymphs Abs: 0.9 10*3/uL (ref 0.7–4.0)
MCH: 30.2 pg (ref 26.0–34.0)
MCHC: 33.4 g/dL (ref 30.0–36.0)
MCV: 90.5 fL (ref 80.0–100.0)
Monocytes Absolute: 0.5 10*3/uL (ref 0.1–1.0)
Monocytes Relative: 8 %
Neutro Abs: 4.6 10*3/uL (ref 1.7–7.7)
Neutrophils Relative %: 74 %
Platelet Count: 272 10*3/uL (ref 150–400)
RBC: 4.3 MIL/uL (ref 3.87–5.11)
RDW: 14.3 % (ref 11.5–15.5)
WBC Count: 6.1 10*3/uL (ref 4.0–10.5)
nRBC: 0 % (ref 0.0–0.2)

## 2019-10-29 LAB — TSH: TSH: 9.221 u[IU]/mL — ABNORMAL HIGH (ref 0.308–3.960)

## 2019-10-29 MED ORDER — SODIUM CHLORIDE 0.9 % IV SOLN
Freq: Once | INTRAVENOUS | Status: AC
  Administered 2019-10-29: 10:00:00 via INTRAVENOUS
  Filled 2019-10-29: qty 250

## 2019-10-29 MED ORDER — HEPARIN SOD (PORK) LOCK FLUSH 100 UNIT/ML IV SOLN
500.0000 [IU] | Freq: Once | INTRAVENOUS | Status: AC | PRN
  Administered 2019-10-29: 12:00:00 500 [IU]
  Filled 2019-10-29: qty 5

## 2019-10-29 MED ORDER — SODIUM CHLORIDE 0.9% FLUSH
10.0000 mL | INTRAVENOUS | Status: DC | PRN
  Administered 2019-10-29: 12:00:00 10 mL
  Filled 2019-10-29: qty 10

## 2019-10-29 MED ORDER — SODIUM CHLORIDE 0.9 % IV SOLN
1200.0000 mg | Freq: Once | INTRAVENOUS | Status: AC
  Administered 2019-10-29: 1200 mg via INTRAVENOUS
  Filled 2019-10-29: qty 20

## 2019-10-29 NOTE — Progress Notes (Signed)
[facility] OFFICE PROGRESS NOTE  Koirala, Dibas, MD [address] [address]  DIAGNOSIS: Recurrent small cell lung cancer initially diagnosed as Limited stage (T2b, N2, M0) small cell lung cancer diagnosed in November 2017 and presented with large right hilar mass with mediastinal invasion and a right lower lobe pulmonary nodule. The patient had disease recurrence in the right lower lobe and mediastinal lymph nodes in February 2020.  PRIOR THERAPY:  1) Systemic chemotherapy with cisplatin 60 MG/M2 on day 1 and etoposide 120 MG/M2 on days 1, 2 and 3 concurrent with radiation status post 6 cycles. Last dose was giving March 06, 2017. 2) status post prophylactic cranial irradiation.  CURRENT THERAPY: Systemic chemotherapy with carboplatin for AUC of 5 on day 1, etoposide. 100 mg/M2 on days 1, 2 and 3 as well as Tecentriq 1200 mg IV with Neulasta support every 3 weeks. First dose March 05, 2019.She is status post 11 cycles. Starting from cycle 5 the patient will be treated with single agent Tecentriq 1200 mg IV every 3 weeks. She received cycles 8-11 in [place].    INTERVAL HISTORY: Tammy Bowers 68 y.o. female returns to the clinic for a follow-up visit.  The patient had been in [place] over the last several months and was receiving immunotherapy with Tecentriq under the care of Dr. Lockie Pares.  She and her husband recently returned to [facility] and she is here to resume her treatment today with Tecentriq.  She is feeling well today without any concerning complaints.  She denies any fever, chills, night sweats, or weight loss. She denies any chest pain, shortness of breath, or hemoptysis. She reports her baseline cough. She denies any nausea or vomiting. She reports alternating between constipation with her pain medication and diarrhea.  She denies any headache or visual changes since being in the emergency room in [place] on 10/01/2019 for the chief complaints  of left sided numbness and speech disturbances that was thought to be related to a TIA or a migraine with aura. She denies any rashes or skin changes.  She is here today to resume her treatment with cycle #12.   MEDICAL HISTORY: Past Medical History:  Diagnosis Date  . Adenopathy 10/10/2016   PERICARINAL  . Anemia   . Arthritis   . Asthma   . Cancer ([place])    lung cancer Small Cell  . COPD (chronic obstructive pulmonary disease) ([place])   . Dyspnea   . Headache    migraines  . Hilar mass 10/10/2016   RIGHT  . History of kidney stones   . Insomnia   . Lung nodules 10/10/2016   RIGHT LOWER LOBE  . Mass of both adrenal glands ([place]) 10/10/2016  . Mood swing   . Pneumonia   . Spinal headache   . Substance abuse ([place])    TOBACCO  . UNSPECIFIED INFECTION OF BONE ANKLE AND FOOT 10/22/2009   Annotation: left ankle Qualifier: Diagnosis of  By: Patsy Baltimore RN, Denise      ALLERGIES:  is allergic to lidocaine.  MEDICATIONS:  Current Outpatient Medications  Medication Sig Dispense Refill  . albuterol (PROVENTIL HFA;VENTOLIN HFA) 108 (90 BASE) MCG/ACT inhaler Inhale 2 puffs into the lungs every 4 (four) hours as needed for wheezing.    . Cyclobenzaprine HCl (FLEXERIL PO) Take 5 mg by mouth 3 (three) times daily as needed (muscle spasms).     . diphenoxylate-atropine (LOMOTIL) 2.5-0.025 MG tablet Take 1 tablet by mouth 4 (four) times  daily as needed for diarrhea or loose stools. 30 tablet 0  . Fluticasone-Salmeterol (ADVAIR) 500-50 MCG/DOSE AEPB Inhale 1 puff into the lungs 2 (two) times daily.    Marland Kitchen ibuprofen (ADVIL) 800 MG tablet Take 800 mg by mouth every 8 (eight) hours as needed.    Marland Kitchen levothyroxine (SYNTHROID) 50 MCG tablet Take 1 tablet by mouth daily.    Marland Kitchen LORazepam (ATIVAN) 0.5 MG tablet Take 1 tablet (0.5 mg total) by mouth at bedtime. (Patient taking differently: Take 0.5 mg by mouth at bedtime as needed for anxiety. ) 20 tablet 0  . Multiple Vitamin (MULTIVITAMIN WITH MINERALS) TABS  tablet Take 1 tablet by mouth daily.     . prochlorperazine (COMPAZINE) 10 MG tablet TAKE 1 TABLET(10 MG) BY MOUTH EVERY 6 HOURS AS NEEDED FOR NAUSEA OR VOMITING 30 tablet 0  . sertraline (ZOLOFT) 50 MG tablet Take 50 mg by mouth at bedtime.  5   No current facility-administered medications for this visit.    Facility-Administered Medications Ordered in Other Visits  Medication Dose Route Frequency Provider Last Rate Last Dose  . pegfilgrastim-cbqv (UDENYCA) injection 6 mg  6 mg Subcutaneous Once Curt Bears, MD        SURGICAL HISTORY:  Past Surgical History:  Procedure Laterality Date  . ANKLE SURGERY     hardware removal, and initial ankle surgery  . APPENDECTOMY    . IR IMAGING GUIDED PORT INSERTION  04/12/2019  . LUNG BIOPSY N/A 10/19/2016   Procedure: LUNG BIOPSY;  Surgeon: Grace Isaac, MD;  Location: [facility];  Service: Thoracic;  Laterality: N/A;  . LUNG BIOPSY N/A 02/20/2019   Procedure: LUNG BIOPSY;  Surgeon: Grace Isaac, MD;  Location: [facility];  Service: Thoracic;  Laterality: N/A;  . TONSILLECTOMY    . VIDEO BRONCHOSCOPY WITH ENDOBRONCHIAL ULTRASOUND N/A 10/19/2016   Procedure: VIDEO BRONCHOSCOPY WITH ENDOBRONCHIAL ULTRASOUND;  Surgeon: Grace Isaac, MD;  Location: [facility];  Service: Thoracic;  Laterality: N/A;  . VIDEO BRONCHOSCOPY WITH ENDOBRONCHIAL ULTRASOUND N/A 02/20/2019   Procedure: VIDEO BRONCHOSCOPY WITH ENDOBRONCHIAL ULTRASOUND;  Surgeon: Grace Isaac, MD;  Location: [facility];  Service: Thoracic;  Laterality: N/A;    REVIEW OF SYSTEMS:   Review of Systems  Constitutional: Negative for appetite change, chills, fatigue, fever and unexpected weight change.  HENT: Negative for mouth sores, nosebleeds, sore throat and trouble swallowing.   Eyes: Negative for eye problems and icterus.  Respiratory: Positive for baseline cough. Negative for hemoptysis, shortness of breath and wheezing.   Cardiovascular: Negative for chest pain and leg swelling.   Gastrointestinal: Negative for abdominal pain, constipation, diarrhea, nausea and vomiting.  Genitourinary: Negative for bladder incontinence, difficulty urinating, dysuria, frequency and hematuria.   Musculoskeletal: Negative for back pain, gait problem, neck pain and neck stiffness.  Skin: Negative for itching and rash.  Neurological: Negative for dizziness, extremity weakness, gait problem, headaches, light-headedness and seizures.  Hematological: Negative for adenopathy. Does not bruise/bleed easily.  Psychiatric/Behavioral: Negative for confusion, depression and sleep disturbance. The patient is not nervous/anxious.     PHYSICAL EXAMINATION:  Blood pressure (!) 119/105, pulse (!) 111, temperature 98.2 F (36.8 C), temperature source Temporal, resp. rate 18, height 5\' 6"  (1.676 m), weight 205 lb 9.6 oz (93.3 kg), SpO2 96 %.  ECOG PERFORMANCE STATUS: 1 - Symptomatic but completely ambulatory  Physical Exam  Constitutional: Oriented to person, place, and time and well-developed, well-nourished, and in no distress.  HENT:  Head: Normocephalic and atraumatic.  Mouth/Throat:  Oropharynx is clear and moist. No oropharyngeal exudate.  Eyes: Conjunctivae are normal. Right eye exhibits no discharge. Left eye exhibits no discharge. No scleral icterus.  Neck: Normal range of motion. Neck supple.  Cardiovascular: Normal rate, regular rhythm, normal heart sounds and intact distal pulses.   Pulmonary/Chest: Effort normal and breath sounds normal. No respiratory distress. No wheezes. No rales.  Abdominal: Soft. Bowel sounds are normal. Exhibits no distension and no mass. There is no tenderness.  Musculoskeletal: Normal range of motion. Exhibits no edema.  Lymphadenopathy:    No cervical adenopathy.  Neurological: Alert and oriented to person, place, and time. Exhibits normal muscle tone. Gait normal. Coordination normal.  Skin: Skin is warm and dry. No rash noted. Not diaphoretic. No erythema. No  pallor.  Psychiatric: Mood, memory and judgment normal.  Vitals reviewed.  LABORATORY DATA: Lab Results  Component Value Date   WBC 6.1 10/29/2019   HGB 13.0 10/29/2019   HCT 38.9 10/29/2019   MCV 90.5 10/29/2019   PLT 272 10/29/2019      Chemistry      Component Value Date/Time   NA 144 07/09/2019 1156   NA 139 11/24/2017 0913   K 3.5 07/09/2019 1156   K 3.8 11/24/2017 0913   CL 103 07/09/2019 1156   CO2 26 07/09/2019 1156   CO2 24 11/24/2017 0913   BUN 19 07/09/2019 1156   BUN 22.1 11/24/2017 0913   CREATININE 1.10 (H) 07/09/2019 1156   CREATININE 1.2 (H) 11/24/2017 0913      Component Value Date/Time   CALCIUM 9.4 07/09/2019 1156   CALCIUM 9.7 11/24/2017 0913   ALKPHOS 100 07/09/2019 1156   ALKPHOS 99 11/24/2017 0913   AST 13 (L) 07/09/2019 1156   AST 15 11/24/2017 0913   ALT 9 07/09/2019 1156   ALT 7 11/24/2017 0913   BILITOT 0.2 (L) 07/09/2019 1156   BILITOT 0.37 11/24/2017 0913       RADIOGRAPHIC STUDIES:  No results found.   ASSESSMENT/PLAN:  This is a very pleasant 68 year old Caucasian female with recurrent small cell lung cancer.  She was initially diagnosed as limited stage (T2b, N2, M0) in 2017.  She presented with a large right hilar mass with mediastinal invasion and a right lower lobe pulmonary nodule.  She is status post a course of systemic chemotherapy with cisplatin and etoposide for 6 cycles with concurrent radiation.  She tolerated treatment well except for fatigue.  She also received prophylactic cranial irradiation.  She had been on observation since March 2018.  Recent imaging studies from February 2020 showed evidence of disease recurrence within right lower lobe pulmonary nodules in addition to mediastinal lymphadenopathy.  A repeat bronchoscopy with endobronchial ultrasound and biopsy confirmed recurrence of small cell lung cancer.   She is currently undergoing systemic chemotherapy with carboplatin, etoposide, and Tecentriq.  She is  status post 11 cycles. Starting from cycle #5 the patient has been receiving single agent maintenance Tecentriq.  She tolerated treatment well except for itching and fatigue.   The patient was seen with Dr. Julien Nordmann today. Labs were reviewed. She is ready to resume treatment with Tecentriq here in [place]. She receive cycle #12 today as scheduled.   We will see her back for a follow up visit in 3 weeks for evaluation before starting her next cycle #13.   The patient was advised to call immediately if she has any concerning symptoms in the interval. The patient voices understanding of current disease status and treatment options  and is in agreement with the current care plan. All questions were answered. The patient knows to call the clinic with any problems, questions or concerns. We can certainly see the patient much sooner if necessary   No orders of the defined types were placed in this encounter.    Geraldo Haris L Tyrah Broers, PA-C 10/29/19  ADDENDUM: Hematology/Oncology Attending: I had a face-to-face encounter with the patient today.  I recommended her care plan.  This is a very pleasant 68 years old white female with recurrent small cell lung cancer status post induction treatment with systemic chemotherapy with carboplatin, etoposide and Tecentriq with partial response and she is currently on treatment with maintenance Tecentriq status post total of 11 cycles.  She was in [place] for the last few months staying with her husband and receiving treatment there during this peroid.  Her imaging studies did not show any evidence for disease progression while she is there.  The patient is back to [place] and would like to resume her treatment here.  She is feeling fine today with no concerning complaints except for fatigue. I recommended for the patient to proceed with cycle #12 of her treatment today as planned. She will come back for follow-up visit in 3 weeks for evaluation before the next  cycle of her treatment. She was advised to call immediately if she has any other concerning symptoms in the interval.  Disclaimer: This note was dictated with voice recognition software. Similar sounding words can inadvertently be transcribed and may be missed upon review. Eilleen Kempf, MD 10/29/19

## 2019-10-29 NOTE — Patient Instructions (Signed)
[facility] Discharge Instructions for Patients Receiving Chemotherapy  Today you received the following chemotherapy agents: Atezolizumab Gildardo Pounds)  To help prevent nausea and vomiting after your treatment, we encourage you to take your nausea medication as directed.    If you develop nausea and vomiting that is not controlled by your nausea medication, call the clinic.   BELOW ARE SYMPTOMS THAT SHOULD BE REPORTED IMMEDIATELY:  *FEVER GREATER THAN 100.5 F  *CHILLS WITH OR WITHOUT FEVER  NAUSEA AND VOMITING THAT IS NOT CONTROLLED WITH YOUR NAUSEA MEDICATION  *UNUSUAL SHORTNESS OF BREATH  *UNUSUAL BRUISING OR BLEEDING  TENDERNESS IN MOUTH AND THROAT WITH OR WITHOUT PRESENCE OF ULCERS  *URINARY PROBLEMS  *BOWEL PROBLEMS  UNUSUAL RASH Items with * indicate a potential emergency and should be followed up as soon as possible.  Feel free to call the clinic should you have any questions or concerns. The clinic phone number is [phone].  Please show the [facility] at check-in to the Emergency Department and triage nurse.  Coronavirus (COVID-19) Are you at risk?  Are you at risk for the Coronavirus (COVID-19)?  To be considered HIGH RISK for Coronavirus (COVID-19), you have to meet the following criteria:  . Traveled to [place], [place], [place], [place] or [place]; or in the [place] to [place], [place], [place], or [place]; and have fever, cough, and shortness of breath within the last 2 weeks of travel OR . Been in close contact with a person diagnosed with COVID-19 within the last 2 weeks and have fever, cough, and shortness of breath . IF YOU DO NOT MEET THESE CRITERIA, YOU ARE CONSIDERED LOW RISK FOR COVID-19.  What to do if you are HIGH RISK for COVID-19?  Marland Kitchen If you are having a medical emergency, call 911. . Seek medical care right away. Before you go to a doctor's office, urgent care or emergency department, call ahead and tell them  about your recent travel, contact with someone diagnosed with COVID-19, and your symptoms. You should receive instructions from your physician's office regarding next steps of care.  . When you arrive at healthcare provider, tell the healthcare staff immediately you have returned from visiting [place], [place], [place], [place] or [place]; or traveled in the [place] to [place], [place], [place], or [place]; in the last two weeks or you have been in close contact with a person diagnosed with COVID-19 in the last 2 weeks.   . Tell the health care staff about your symptoms: fever, cough and shortness of breath. . After you have been seen by a medical provider, you will be either: o Tested for (COVID-19) and discharged home on quarantine except to seek medical care if symptoms worsen, and asked to  - Stay home and avoid contact with others until you get your results (4-5 days)  - Avoid travel on public transportation if possible (such as bus, train, or airplane) or o Sent to the Emergency Department by EMS for evaluation, COVID-19 testing, and possible admission depending on your condition and test results.  What to do if you are LOW RISK for COVID-19?  Reduce your risk of any infection by using the same precautions used for avoiding the common cold or flu:  Marland Kitchen Wash your hands often with soap and warm water for at least 20 seconds.  If soap and water are not readily available, use an alcohol-based hand sanitizer with at least 60% alcohol.  . If coughing  or sneezing, cover your mouth and nose by coughing or sneezing into the elbow areas of your shirt or coat, into a tissue or into your sleeve (not your hands). . Avoid shaking hands with others and consider head nods or verbal greetings only. . Avoid touching your eyes, nose, or mouth with unwashed hands.  . Avoid close contact with people who are sick. . Avoid places or events with large numbers of people in one location, like concerts or  sporting events. . Carefully consider travel plans you have or are making. . If you are planning any travel outside or inside the [place], visit the CDC's Travelers' Health webpage for the latest health notices. . If you have some symptoms but not all symptoms, continue to monitor at home and seek medical attention if your symptoms worsen. . If you are having a medical emergency, call 911.   [place] / e-Visit: [email or web address]         [facility]: [facility]: [phone]                   [name]: [phone]

## 2019-10-30 ENCOUNTER — Telehealth: Payer: Self-pay | Admitting: Physician Assistant

## 2019-10-30 NOTE — Telephone Encounter (Signed)
Scheduled per los. Called and left msg. Mailed printout  °

## 2019-11-19 ENCOUNTER — Inpatient Hospital Stay: Payer: Medicare Other | Attending: Internal Medicine

## 2019-11-19 ENCOUNTER — Inpatient Hospital Stay: Payer: Medicare Other

## 2019-11-19 ENCOUNTER — Other Ambulatory Visit: Payer: Self-pay

## 2019-11-19 ENCOUNTER — Encounter: Payer: Self-pay | Admitting: Internal Medicine

## 2019-11-19 ENCOUNTER — Inpatient Hospital Stay (HOSPITAL_BASED_OUTPATIENT_CLINIC_OR_DEPARTMENT_OTHER): Payer: Medicare Other | Admitting: Internal Medicine

## 2019-11-19 VITALS — BP 151/82 | HR 97

## 2019-11-19 VITALS — BP 114/96 | HR 101 | Temp 97.5°F | Resp 18 | Ht 66.0 in | Wt 205.6 lb

## 2019-11-19 DIAGNOSIS — Z79899 Other long term (current) drug therapy: Secondary | ICD-10-CM | POA: Insufficient documentation

## 2019-11-19 DIAGNOSIS — Z7951 Long term (current) use of inhaled steroids: Secondary | ICD-10-CM | POA: Insufficient documentation

## 2019-11-19 DIAGNOSIS — R3911 Hesitancy of micturition: Secondary | ICD-10-CM | POA: Diagnosis not present

## 2019-11-19 DIAGNOSIS — C3491 Malignant neoplasm of unspecified part of right bronchus or lung: Secondary | ICD-10-CM | POA: Diagnosis not present

## 2019-11-19 DIAGNOSIS — J449 Chronic obstructive pulmonary disease, unspecified: Secondary | ICD-10-CM | POA: Diagnosis not present

## 2019-11-19 DIAGNOSIS — C3411 Malignant neoplasm of upper lobe, right bronchus or lung: Secondary | ICD-10-CM | POA: Insufficient documentation

## 2019-11-19 DIAGNOSIS — Z791 Long term (current) use of non-steroidal anti-inflammatories (NSAID): Secondary | ICD-10-CM | POA: Diagnosis not present

## 2019-11-19 DIAGNOSIS — R5382 Chronic fatigue, unspecified: Secondary | ICD-10-CM

## 2019-11-19 DIAGNOSIS — Z5112 Encounter for antineoplastic immunotherapy: Secondary | ICD-10-CM | POA: Insufficient documentation

## 2019-11-19 DIAGNOSIS — C781 Secondary malignant neoplasm of mediastinum: Secondary | ICD-10-CM | POA: Insufficient documentation

## 2019-11-19 DIAGNOSIS — R5383 Other fatigue: Secondary | ICD-10-CM | POA: Diagnosis not present

## 2019-11-19 DIAGNOSIS — C7801 Secondary malignant neoplasm of right lung: Secondary | ICD-10-CM | POA: Insufficient documentation

## 2019-11-19 DIAGNOSIS — Z23 Encounter for immunization: Secondary | ICD-10-CM

## 2019-11-19 DIAGNOSIS — Z95828 Presence of other vascular implants and grafts: Secondary | ICD-10-CM

## 2019-11-19 LAB — CMP (CANCER CENTER ONLY)
ALT: 10 U/L (ref 0–44)
AST: 16 U/L (ref 15–41)
Albumin: 3.6 g/dL (ref 3.5–5.0)
Alkaline Phosphatase: 103 U/L (ref 38–126)
Anion gap: 11 (ref 5–15)
BUN: 20 mg/dL (ref 8–23)
CO2: 27 mmol/L (ref 22–32)
Calcium: 9.5 mg/dL (ref 8.9–10.3)
Chloride: 105 mmol/L (ref 98–111)
Creatinine: 1.15 mg/dL — ABNORMAL HIGH (ref 0.44–1.00)
GFR, Est AFR Am: 57 mL/min — ABNORMAL LOW (ref >60)
GFR, Estimated: 50 mL/min — ABNORMAL LOW (ref >60)
Glucose, Bld: 96 mg/dL (ref 70–99)
Potassium: 3.9 mmol/L (ref 3.5–5.1)
Sodium: 143 mmol/L (ref 135–145)
Total Bilirubin: 0.3 mg/dL (ref 0.3–1.2)
Total Protein: 6.9 g/dL (ref 6.5–8.1)

## 2019-11-19 LAB — CBC WITH DIFFERENTIAL (CANCER CENTER ONLY)
Abs Immature Granulocytes: 0.02 10*3/uL (ref 0.00–0.07)
Basophils Absolute: 0.1 10*3/uL (ref 0.0–0.1)
Basophils Relative: 1 %
Eosinophils Absolute: 0.3 10*3/uL (ref 0.0–0.5)
Eosinophils Relative: 3 %
HCT: 40.7 % (ref 36.0–46.0)
Hemoglobin: 13.4 g/dL (ref 12.0–15.0)
Immature Granulocytes: 0 %
Lymphocytes Relative: 14 %
Lymphs Abs: 1.1 10*3/uL (ref 0.7–4.0)
MCH: 30.9 pg (ref 26.0–34.0)
MCHC: 32.9 g/dL (ref 30.0–36.0)
MCV: 93.8 fL (ref 80.0–100.0)
Monocytes Absolute: 0.5 10*3/uL (ref 0.1–1.0)
Monocytes Relative: 6 %
Neutro Abs: 5.9 10*3/uL (ref 1.7–7.7)
Neutrophils Relative %: 76 %
Platelet Count: 285 10*3/uL (ref 150–400)
RBC: 4.34 MIL/uL (ref 3.87–5.11)
RDW: 14.2 % (ref 11.5–15.5)
WBC Count: 7.8 10*3/uL (ref 4.0–10.5)
nRBC: 0 % (ref 0.0–0.2)

## 2019-11-19 MED ORDER — SODIUM CHLORIDE 0.9% FLUSH
10.0000 mL | INTRAVENOUS | Status: DC | PRN
  Administered 2019-11-19: 10 mL
  Filled 2019-11-19: qty 10

## 2019-11-19 MED ORDER — SODIUM CHLORIDE 0.9 % IV SOLN
1200.0000 mg | Freq: Once | INTRAVENOUS | Status: AC
  Administered 2019-11-19: 1200 mg via INTRAVENOUS
  Filled 2019-11-19: qty 20

## 2019-11-19 MED ORDER — HEPARIN SOD (PORK) LOCK FLUSH 100 UNIT/ML IV SOLN
500.0000 [IU] | Freq: Once | INTRAVENOUS | Status: AC | PRN
  Administered 2019-11-19: 500 [IU]
  Filled 2019-11-19: qty 5

## 2019-11-19 MED ORDER — SODIUM CHLORIDE 0.9% FLUSH
10.0000 mL | Freq: Once | INTRAVENOUS | Status: AC
  Administered 2019-11-19: 10 mL
  Filled 2019-11-19: qty 10

## 2019-11-19 MED ORDER — SODIUM CHLORIDE 0.9 % IV SOLN
Freq: Once | INTRAVENOUS | Status: AC
  Administered 2019-11-19: 16:00:00 via INTRAVENOUS
  Filled 2019-11-19: qty 250

## 2019-11-19 MED ORDER — INFLUENZA VAC A&B SA ADJ QUAD 0.5 ML IM PRSY
0.5000 mL | PREFILLED_SYRINGE | Freq: Once | INTRAMUSCULAR | Status: AC
  Administered 2019-11-19: 0.5 mL via INTRAMUSCULAR

## 2019-11-19 MED ORDER — INFLUENZA VAC A&B SA ADJ QUAD 0.5 ML IM PRSY
PREFILLED_SYRINGE | INTRAMUSCULAR | Status: AC
  Filled 2019-11-19: qty 0.5

## 2019-11-19 NOTE — Patient Instructions (Signed)

## 2019-11-19 NOTE — Progress Notes (Signed)
[facility] [facility] [phone]   Fax:(336) [place], MD [address] [place] [address]  DIAGNOSIS: Recurrent small cell lung cancer initially diagnosed as Limited stage (T2b, N2, M0) small cell lung cancer diagnosed in November 2017 and presented with large right hilar mass with mediastinal invasion and a right lower lobe pulmonary nodule.  The patient had disease recurrence in the right lower lobe and mediastinal lymph nodes in February 2020.  PRIOR THERAPY:   1) Systemic chemotherapy with cisplatin 60 MG/M2 on day 1 and etoposide 120 MG/M2 on days 1, 2 and 3 concurrent with radiation status post 6 cycles.  Last dose was giving March 06, 2017. 2) status post prophylactic cranial irradiation.  CURRENT THERAPY: Systemic chemotherapy with carboplatin for AUC of 5 on day 1, etoposide.  100 mg/M2 on days 1, 2 and 3 as well as Tecentriq 1200 mg IV with Neulasta support every 3 weeks.  First dose March 05, 2019.  Status post 8 cycles.  Starting from cycle 5, the patient will be treated with single agent Tecentriq 1200 mg IV every 3 weeks.  First dose May 28, 2019.  INTERVAL HISTORY: Tammy Bowers 68 y.o. female returns to the clinic today for follow-up visit.  The patient is feeling fine today with no concerning complaints except for some urine hesitancy.  She denied having any dysuria or hematuria.  She has no current chest pain, shortness of breath, cough or hemoptysis.  She denied having any fever or chills.  She has no nausea, vomiting, diarrhea or constipation.  She has no headache or visual changes.  She is tolerating her treatment with Tecentriq fairly well.  She is here for evaluation before starting cycle #9.  MEDICAL HISTORY: Past Medical History:  Diagnosis Date  . Adenopathy 10/10/2016   PERICARINAL  . Anemia   . Arthritis   . Asthma   . Cancer ([facility])    lung cancer Small Cell  . COPD (chronic  obstructive pulmonary disease) ([facility])   . Dyspnea   . Headache    migraines  . Hilar mass 10/10/2016   RIGHT  . History of kidney stones   . Insomnia   . Lung nodules 10/10/2016   RIGHT LOWER LOBE  . Mass of both adrenal glands ([facility]) 10/10/2016  . Mood swing   . Pneumonia   . Spinal headache   . Substance abuse ([facility])    TOBACCO  . UNSPECIFIED INFECTION OF BONE ANKLE AND FOOT 10/22/2009   Annotation: left ankle Qualifier: Diagnosis of  By: Patsy Baltimore RN, Denise      ALLERGIES:  is allergic to lidocaine.  MEDICATIONS:  Current Outpatient Medications  Medication Sig Dispense Refill  . albuterol (PROVENTIL HFA;VENTOLIN HFA) 108 (90 BASE) MCG/ACT inhaler Inhale 2 puffs into the lungs every 4 (four) hours as needed for wheezing.    . Cyclobenzaprine HCl (FLEXERIL PO) Take 5 mg by mouth 3 (three) times daily as needed (muscle spasms).     . diphenoxylate-atropine (LOMOTIL) 2.5-0.025 MG tablet Take 1 tablet by mouth 4 (four) times daily as needed for diarrhea or loose stools. 30 tablet 0  . Fluticasone-Salmeterol (ADVAIR) 500-50 MCG/DOSE AEPB Inhale 1 puff into the lungs 2 (two) times daily.    Marland Kitchen ibuprofen (ADVIL) 800 MG tablet Take 800 mg by mouth every 8 (eight) hours as needed.    Marland Kitchen levothyroxine (SYNTHROID) 50 MCG tablet Take 1 tablet by mouth daily.    Marland Kitchen  LORazepam (ATIVAN) 0.5 MG tablet Take 1 tablet (0.5 mg total) by mouth at bedtime. (Patient taking differently: Take 0.5 mg by mouth at bedtime as needed for anxiety. ) 20 tablet 0  . Multiple Vitamin (MULTIVITAMIN WITH MINERALS) TABS tablet Take 1 tablet by mouth daily.     . prochlorperazine (COMPAZINE) 10 MG tablet TAKE 1 TABLET(10 MG) BY MOUTH EVERY 6 HOURS AS NEEDED FOR NAUSEA OR VOMITING 30 tablet 0  . sertraline (ZOLOFT) 50 MG tablet Take 50 mg by mouth at bedtime.  5   No current facility-administered medications for this visit.    Facility-Administered Medications Ordered in Other Visits  Medication Dose Route Frequency  Provider Last Rate Last Dose  . pegfilgrastim-cbqv (UDENYCA) injection 6 mg  6 mg Subcutaneous Once Curt Bears, MD        SURGICAL HISTORY:  Past Surgical History:  Procedure Laterality Date  . ANKLE SURGERY     hardware removal, and initial ankle surgery  . APPENDECTOMY    . IR IMAGING GUIDED PORT INSERTION  04/12/2019  . LUNG BIOPSY N/A 10/19/2016   Procedure: LUNG BIOPSY;  Surgeon: Grace Isaac, MD;  Location: [facility];  Service: Thoracic;  Laterality: N/A;  . LUNG BIOPSY N/A 02/20/2019   Procedure: LUNG BIOPSY;  Surgeon: Grace Isaac, MD;  Location: [facility];  Service: Thoracic;  Laterality: N/A;  . TONSILLECTOMY    . VIDEO BRONCHOSCOPY WITH ENDOBRONCHIAL ULTRASOUND N/A 10/19/2016   Procedure: VIDEO BRONCHOSCOPY WITH ENDOBRONCHIAL ULTRASOUND;  Surgeon: Grace Isaac, MD;  Location: [facility];  Service: Thoracic;  Laterality: N/A;  . VIDEO BRONCHOSCOPY WITH ENDOBRONCHIAL ULTRASOUND N/A 02/20/2019   Procedure: VIDEO BRONCHOSCOPY WITH ENDOBRONCHIAL ULTRASOUND;  Surgeon: Grace Isaac, MD;  Location: [facility];  Service: Thoracic;  Laterality: N/A;    REVIEW OF SYSTEMS:  A comprehensive review of systems was negative except for: Constitutional: positive for fatigue Genitourinary: positive for hesitancy   PHYSICAL EXAMINATION: General appearance: alert, cooperative, fatigued and no distress Head: Normocephalic, without obvious abnormality, atraumatic Neck: no adenopathy, no JVD, supple, symmetrical, trachea midline and thyroid not enlarged, symmetric, no tenderness/mass/nodules Lymph nodes: Cervical, supraclavicular, and axillary nodes normal. Resp: clear to auscultation bilaterally Back: symmetric, no curvature. ROM normal. No CVA tenderness. Cardio: regular rate and rhythm, S1, S2 normal, no murmur, click, rub or gallop GI: soft, non-tender; bowel sounds normal; no masses,  no organomegaly Extremities: extremities normal, atraumatic, no cyanosis or edema  ECOG PERFORMANCE  STATUS: 1 - Symptomatic but completely ambulatory  Blood pressure (!) 114/96, pulse (!) 101, temperature (!) 97.5 F (36.4 C), temperature source Oral, resp. rate 18, height 5\' 6"  (1.676 m), weight 205 lb 9.6 oz (93.3 kg), SpO2 96 %.  LABORATORY DATA: Lab Results  Component Value Date   WBC 7.8 11/19/2019   HGB 13.4 11/19/2019   HCT 40.7 11/19/2019   MCV 93.8 11/19/2019   PLT 285 11/19/2019      Chemistry      Component Value Date/Time   NA 142 10/29/2019 0829   NA 139 11/24/2017 0913   K 3.6 10/29/2019 0829   K 3.8 11/24/2017 0913   CL 105 10/29/2019 0829   CO2 26 10/29/2019 0829   CO2 24 11/24/2017 0913   BUN 26 (H) 10/29/2019 0829   BUN 22.1 11/24/2017 0913   CREATININE 1.14 (H) 10/29/2019 0829   CREATININE 1.2 (H) 11/24/2017 0913      Component Value Date/Time   CALCIUM 9.4 10/29/2019 0829   CALCIUM 9.7  11/24/2017 0913   ALKPHOS 95 10/29/2019 0829   ALKPHOS 99 11/24/2017 0913   AST 13 (L) 10/29/2019 0829   AST 15 11/24/2017 0913   ALT 8 10/29/2019 0829   ALT 7 11/24/2017 0913   BILITOT 0.3 10/29/2019 0829   BILITOT 0.37 11/24/2017 0913       RADIOGRAPHIC STUDIES: No results found.  ASSESSMENT AND PLAN:  This is a very pleasant 68 years old white female with recurrent small cell lung cancer that was initially diagnosed as limited stage disease status post a course of systemic chemotherapy with cisplatin and etoposide for 6 cycles concurrent with radiation. She tolerated her treatment well except for fatigue. She is also status post prophylactic cranial irradiation. The patient has been in observation since March 2018. She was found on recent imaging studies to have evidence for disease recurrence with right lower lobe pulmonary nodules in addition to mediastinal lymphadenopathy. Repeat bronchoscopy with endobronchial ultrasound and biopsy confirmed recurrence of his small cell lung cancer. The patient was started on systemic chemotherapy with carboplatin,  etoposide and Tecentriq status post 8 cycles.  She has partial response after cycle #4 and starting from cycle #5 the patient is currently on maintenance treatment with Tecentriq.  She has been tolerating this treatment well with no concerning adverse effects. I recommended for her to proceed with cycle #9 today as planned. I will see her back for follow-up visit in 3 weeks for evaluation with repeat CT scan of the chest, abdomen and pelvis for restaging of her disease before starting cycle #10. The patient was advised to call immediately if she has any other concerning symptoms in the interval. The patient voices understanding of current disease status and treatment options and is in agreement with the current care plan.  All questions were answered. The patient knows to call the clinic with any problems, questions or concerns. We can certainly see the patient much sooner if necessary.  Disclaimer: This note was dictated with voice recognition software. Similar sounding words can inadvertently be transcribed and may not be corrected upon review.

## 2019-11-19 NOTE — Patient Instructions (Addendum)
[facility] Discharge Instructions for Patients Receiving Chemotherapy  Today you received the following Immunotherapy: Atezolizumab Tammy Bowers)  To help prevent nausea and vomiting after your treatment, we encourage you to take your nausea medication as directed by your MD.   If you develop nausea and vomiting that is not controlled by your nausea medication, call the clinic.   BELOW ARE SYMPTOMS THAT SHOULD BE REPORTED IMMEDIATELY:  *FEVER GREATER THAN 100.5 F  *CHILLS WITH OR WITHOUT FEVER  NAUSEA AND VOMITING THAT IS NOT CONTROLLED WITH YOUR NAUSEA MEDICATION  *UNUSUAL SHORTNESS OF BREATH  *UNUSUAL BRUISING OR BLEEDING  TENDERNESS IN MOUTH AND THROAT WITH OR WITHOUT PRESENCE OF ULCERS  *URINARY PROBLEMS  *BOWEL PROBLEMS  UNUSUAL RASH Items with * indicate a potential emergency and should be followed up as soon as possible.  Feel free to call the clinic should you have any questions or concerns. The clinic phone number is [phone].  Please show the [facility] at check-in to the Emergency Department and triage nurse. Coronavirus (COVID-19) Are you at risk?  Are you at risk for the Coronavirus (COVID-19)?  To be considered HIGH RISK for Coronavirus (COVID-19), you have to meet the following criteria:  . Traveled to [place], [place], [place], [place] or [place]; or in the [place] to [place], [place], [place], or [place]; and have fever, cough, and shortness of breath within the last 2 weeks of travel OR . Been in close contact with a person diagnosed with COVID-19 within the last 2 weeks and have fever, cough, and shortness of breath . IF YOU DO NOT MEET THESE CRITERIA, YOU ARE CONSIDERED LOW RISK FOR COVID-19.  What to do if you are HIGH RISK for COVID-19?  Marland Kitchen If you are having a medical emergency, call 911. . Seek medical care right away. Before you go to a doctor's office, urgent care or emergency department, call ahead and tell  them about your recent travel, contact with someone diagnosed with COVID-19, and your symptoms. You should receive instructions from your physician's office regarding next steps of care.  . When you arrive at healthcare provider, tell the healthcare staff immediately you have returned from visiting [place], [place], [place], [place] or [place]; or traveled in the [place] to [place], [place], [place], or [place]; in the last two weeks or you have been in close contact with a person diagnosed with COVID-19 in the last 2 weeks.   . Tell the health care staff about your symptoms: fever, cough and shortness of breath. . After you have been seen by a medical provider, you will be either: o Tested for (COVID-19) and discharged home on quarantine except to seek medical care if symptoms worsen, and asked to  - Stay home and avoid contact with others until you get your results (4-5 days)  - Avoid travel on public transportation if possible (such as bus, train, or airplane) or o Sent to the Emergency Department by EMS for evaluation, COVID-19 testing, and possible admission depending on your condition and test results.  What to do if you are LOW RISK for COVID-19?  Reduce your risk of any infection by using the same precautions used for avoiding the common cold or flu:  Marland Kitchen Wash your hands often with soap and warm water for at least 20 seconds.  If soap and water are not readily available, use an alcohol-based hand sanitizer with at least 60% alcohol.  . If coughing  or sneezing, cover your mouth and nose by coughing or sneezing into the elbow areas of your shirt or coat, into a tissue or into your sleeve (not your hands). . Avoid shaking hands with others and consider head nods or verbal greetings only. . Avoid touching your eyes, nose, or mouth with unwashed hands.  . Avoid close contact with people who are sick. . Avoid places or events with large numbers of people in one location, like concerts or  sporting events. . Carefully consider travel plans you have or are making. . If you are planning any travel outside or inside the [place], visit the CDC's Travelers' Health webpage for the latest health notices. . If you have some symptoms but not all symptoms, continue to monitor at home and seek medical attention if your symptoms worsen. . If you are having a medical emergency, call 911.   [facility] / e-Visit: [email or web address]         [facility]:  Urgent Care: [phone]                   [facility]: [phone]

## 2019-11-20 ENCOUNTER — Other Ambulatory Visit: Payer: Self-pay | Admitting: Internal Medicine

## 2019-11-20 ENCOUNTER — Telehealth: Payer: Self-pay | Admitting: *Deleted

## 2019-11-20 LAB — TSH: TSH: 30.382 u[IU]/mL — ABNORMAL HIGH (ref 0.308–3.960)

## 2019-11-20 MED ORDER — LEVOTHYROXINE SODIUM 75 MCG PO TABS: 75.0000 ug | ORAL_TABLET | Freq: Every day | ORAL | 1 refills | Status: DC

## 2019-11-20 NOTE — Telephone Encounter (Signed)
-----   Message from Curt Bears, MD sent at 11/20/2019  1:27 PM EST ----- I increased her dose of levothyroxine to 75 mcg p.o. daily.  Thank you. ----- Message ----- From: Buel Ream, [facility] [place] Sent: 11/19/2019   3:30 PM EST To: Curt Bears, MD

## 2019-11-20 NOTE — Telephone Encounter (Signed)
TCT patient regarding TSH results from 11/19/19.  Spoke with patient and advised that Dr. Julien Nordmann has increased her levothyroxine to 75 mcg daily and that the prescription will be available at [facility] on [address].  Pt voiced understanding. No questions or concerns

## 2019-12-09 ENCOUNTER — Other Ambulatory Visit: Payer: Self-pay

## 2019-12-09 ENCOUNTER — Ambulatory Visit (HOSPITAL_COMMUNITY)
Admission: RE | Admit: 2019-12-09 | Discharge: 2019-12-09 | Disposition: A | Discharge status: Home / Self Care | Payer: Medicare Other | Source: Ambulatory Visit | Attending: Internal Medicine | Admitting: Internal Medicine

## 2019-12-09 ENCOUNTER — Encounter (HOSPITAL_COMMUNITY): Payer: Self-pay

## 2019-12-09 DIAGNOSIS — C3491 Malignant neoplasm of unspecified part of right bronchus or lung: Secondary | ICD-10-CM | POA: Diagnosis present

## 2019-12-09 MED ORDER — IOHEXOL 300 MG/ML  SOLN
100.0000 mL | Freq: Once | INTRAMUSCULAR | Status: AC | PRN
  Administered 2019-12-09: 100 mL via INTRAVENOUS

## 2019-12-09 MED ORDER — HEPARIN SOD (PORK) LOCK FLUSH 100 UNIT/ML IV SOLN
INTRAVENOUS | Status: AC
  Administered 2019-12-09: 500 [IU] via INTRAVENOUS
  Filled 2019-12-09: qty 5

## 2019-12-09 MED ORDER — SODIUM CHLORIDE (PF) 0.9 % IJ SOLN
INTRAMUSCULAR | Status: AC
  Filled 2019-12-09: qty 50

## 2019-12-09 MED ORDER — HEPARIN SOD (PORK) LOCK FLUSH 100 UNIT/ML IV SOLN: 500.0000 [IU] | Freq: Once | INTRAVENOUS | Status: AC

## 2019-12-10 ENCOUNTER — Inpatient Hospital Stay: Payer: Medicare Other

## 2019-12-10 ENCOUNTER — Other Ambulatory Visit: Payer: Self-pay | Admitting: Medical Oncology

## 2019-12-10 ENCOUNTER — Telehealth: Payer: Self-pay | Admitting: Medical Oncology

## 2019-12-10 ENCOUNTER — Inpatient Hospital Stay: Payer: Medicare Other | Admitting: Internal Medicine

## 2019-12-10 DIAGNOSIS — R5382 Chronic fatigue, unspecified: Secondary | ICD-10-CM

## 2019-12-10 MED ORDER — LEVOTHYROXINE SODIUM 75 MCG PO TABS: ORAL_TABLET | ORAL | 0 refills | Status: DC

## 2019-12-10 NOTE — Telephone Encounter (Signed)
Canceled tx today -headache "my head is killing me". Nausea , no energy. Denies fever -has a few chills- " I get those all the time".  Cannot find thyroid med- she has not taken her Synthroid in 10 days " I can't find the pills.  Refill sent .   Marland Kitchen Instructed to call for worsening symptoms and fever, other signs and symptoms of infection or COVID. Schedule message sent to reschedule appts.

## 2019-12-12 ENCOUNTER — Telehealth: Payer: Self-pay | Admitting: Internal Medicine

## 2019-12-12 NOTE — Telephone Encounter (Signed)
R/s appt per 12/22 sch message - pt husband is aware of appt date and time

## 2019-12-15 NOTE — Progress Notes (Signed)
[facility] OFFICE PROGRESS NOTE  Koirala, Dibas, MD [address] [address]  DIAGNOSIS: Recurrent small cell lung cancer initially diagnosed as Limited stage (T2b, N2, M0) small cell lung cancer diagnosed in November 2017 and presented with large right hilar mass with mediastinal invasion and a right lower lobe pulmonary nodule.  The patient had disease recurrence in the right lower lobe and mediastinal lymph nodes in February 2020.  PRIOR THERAPY: 1) Systemic chemotherapy with cisplatin 60 MG/M2 on day 1 and etoposide 120 MG/M2 on days 1, 2 and 3 concurrent with radiation status post 6 cycles.  Last dose was giving March 06, 2017. 2) status post prophylactic cranial irradiation.  CURRENT THERAPY: Systemic chemotherapy with carboplatin for AUC of 5 on day 1, etoposide.  100 mg/M2 on days 1, 2 and 3 as well as Tecentriq 1200 mg IV with Neulasta support every 3 weeks.  First dose March 05, 2019.  Status post 9 cycles.  Starting from cycle 5, the patient will be treated with single agent Tecentriq 1200 mg IV every 3 weeks.  First dose May 28, 2019.  INTERVAL HISTORY: Aleesa Sweigert 68 y.o. female returns to the clinic for a follow up visit. The patient is feeling well today without any concerning complaints. She recently stopped taking her synthroid due to not being able to find her medication. She recently received a refill and has resumed taking it as prescribed. Her appointment last week was delayed until today secondary to having headache and diarrhea last week which has completely resolved at this time. She states that ~12/22 she started to feel fatigued, had diarrhea from the scan contrast, and had a headache for approximately 3 days. She also is prone to frequent migraines. Her husband reportedly had been experiencing similar symptoms. She and her husband feel better at this time. The patient continues to tolerate treatment with Tecentriq well without any  adverse effects. Denies any fever, chills, night sweats, or weight loss. Denies any chest pain, shortness of breath, or hemoptysis. She reports her baseline cough. Denies any nausea, vomiting, constipation, or diarrhea. Denies any headache, visual changes, changes in her gait, or seizures at this time. She had imaging of her brain in October 2020 while in the emergency department in [place] which did not show any concerning findings at that time for metastatic disease to the brain. She denies any rashes or skin changes. The patient recently had a restaging CT scan performed. The patient is here today for evaluation prior to starting cycle # 10   MEDICAL HISTORY: Past Medical History:  Diagnosis Date  . Adenopathy 10/10/2016   PERICARINAL  . Anemia   . Arthritis   . Asthma   . Cancer ([facility])    lung cancer Small Cell  . COPD (chronic obstructive pulmonary disease) ([facility])   . Dyspnea   . Headache    migraines  . Hilar mass 10/10/2016   RIGHT  . History of kidney stones   . Insomnia   . Lung nodules 10/10/2016   RIGHT LOWER LOBE  . Mass of both adrenal glands ([facility]) 10/10/2016  . Mood swing   . Pneumonia   . Spinal headache   . Substance abuse ([facility])    TOBACCO  . UNSPECIFIED INFECTION OF BONE ANKLE AND FOOT 10/22/2009   Annotation: left ankle Qualifier: Diagnosis of  By: Patsy Baltimore RN, Denise      ALLERGIES:  is allergic to lidocaine.  MEDICATIONS:  Current Outpatient Medications  Medication Sig Dispense Refill  . albuterol (PROVENTIL HFA;VENTOLIN HFA) 108 (90 BASE) MCG/ACT inhaler Inhale 2 puffs into the lungs every 4 (four) hours as needed for wheezing.    . Cyclobenzaprine HCl (FLEXERIL PO) Take 5 mg by mouth 3 (three) times daily as needed (muscle spasms).     . diphenoxylate-atropine (LOMOTIL) 2.5-0.025 MG tablet Take 1 tablet by mouth 4 (four) times daily as needed for diarrhea or loose stools. 30 tablet 0  . Fluticasone-Salmeterol (ADVAIR) 500-50 MCG/DOSE AEPB Inhale 1 puff into  the lungs 2 (two) times daily.    Marland Kitchen ibuprofen (ADVIL) 800 MG tablet Take 800 mg by mouth every 8 (eight) hours as needed.    Marland Kitchen levothyroxine (SYNTHROID) 75 MCG tablet TAKE 1 TABLET(75 MCG) BY MOUTH DAILY Pt lost her previous prescription. 90 tablet 0  . LORazepam (ATIVAN) 0.5 MG tablet Take 1 tablet (0.5 mg total) by mouth at bedtime. (Patient taking differently: Take 0.5 mg by mouth at bedtime as needed for anxiety. ) 20 tablet 0  . Multiple Vitamin (MULTIVITAMIN WITH MINERALS) TABS tablet Take 1 tablet by mouth daily.     . prochlorperazine (COMPAZINE) 10 MG tablet TAKE 1 TABLET(10 MG) BY MOUTH EVERY 6 HOURS AS NEEDED FOR NAUSEA OR VOMITING 30 tablet 0  . sertraline (ZOLOFT) 50 MG tablet Take 50 mg by mouth at bedtime.  5   No current facility-administered medications for this visit.   Facility-Administered Medications Ordered in Other Visits  Medication Dose Route Frequency Provider Last Rate Last Admin  . pegfilgrastim-cbqv (UDENYCA) injection 6 mg  6 mg Subcutaneous Once Curt Bears, MD      . sodium chloride flush (NS) 0.9 % injection 10 mL  10 mL Intracatheter PRN Curt Bears, MD   10 mL at 12/16/19 1122    SURGICAL HISTORY:  Past Surgical History:  Procedure Laterality Date  . ANKLE SURGERY     hardware removal, and initial ankle surgery  . APPENDECTOMY    . IR IMAGING GUIDED PORT INSERTION  04/12/2019  . LUNG BIOPSY N/A 10/19/2016   Procedure: LUNG BIOPSY;  Surgeon: Grace Isaac, MD;  Location: [facility];  Service: Thoracic;  Laterality: N/A;  . LUNG BIOPSY N/A 02/20/2019   Procedure: LUNG BIOPSY;  Surgeon: Grace Isaac, MD;  Location: [facility];  Service: Thoracic;  Laterality: N/A;  . TONSILLECTOMY    . VIDEO BRONCHOSCOPY WITH ENDOBRONCHIAL ULTRASOUND N/A 10/19/2016   Procedure: VIDEO BRONCHOSCOPY WITH ENDOBRONCHIAL ULTRASOUND;  Surgeon: Grace Isaac, MD;  Location: [facility];  Service: Thoracic;  Laterality: N/A;  . VIDEO BRONCHOSCOPY WITH ENDOBRONCHIAL ULTRASOUND  N/A 02/20/2019   Procedure: VIDEO BRONCHOSCOPY WITH ENDOBRONCHIAL ULTRASOUND;  Surgeon: Grace Isaac, MD;  Location: [facility];  Service: Thoracic;  Laterality: N/A;    REVIEW OF SYSTEMS:   Review of Systems  Constitutional: Negative for appetite change, chills, fatigue, fever and unexpected weight change.  HENT: Negative for mouth sores, nosebleeds, sore throat and trouble swallowing.   Eyes: Negative for eye problems and icterus.  Respiratory: Positive for baseline cough. Negative for hemoptysis, shortness of breath and wheezing.   Cardiovascular: Negative for chest pain and leg swelling.  Gastrointestinal: Negative for abdominal pain, constipation, diarrhea, nausea and vomiting.  Genitourinary: Negative for bladder incontinence, difficulty urinating, dysuria, frequency and hematuria.   Musculoskeletal: Negative for back pain, gait problem, neck pain and neck stiffness.  Skin: Negative for itching and rash.  Neurological: Negative for dizziness, extremity weakness, gait problem, headaches, light-headedness and seizures.  Hematological: Negative for adenopathy. Does not bruise/bleed easily.  Psychiatric/Behavioral: Negative for confusion, depression and sleep disturbance. The patient is not nervous/anxious.     PHYSICAL EXAMINATION:  There were no vitals taken for this visit.  ECOG PERFORMANCE STATUS: 1 - Symptomatic but completely ambulatory  Physical Exam  Constitutional: Oriented to person, place, and time and well-developed, well-nourished, and in no distress.  HENT:  Head: Normocephalic and atraumatic.  Mouth/Throat: Oropharynx is clear and moist. No oropharyngeal exudate.  Eyes: Conjunctivae are normal. Right eye exhibits no discharge. Left eye exhibits no discharge. No scleral icterus.  Neck: Normal range of motion. Neck supple.  Cardiovascular: Normal rate, regular rhythm, normal heart sounds and intact distal pulses.   Pulmonary/Chest: Effort normal. Quiet breath sounds in  all lung fields. No respiratory distress. No wheezes. No rales.  Abdominal: Soft. Bowel sounds are normal. Exhibits no distension and no mass. There is no tenderness.  Musculoskeletal: Normal range of motion. Exhibits no edema.  Lymphadenopathy:    No cervical adenopathy.  Neurological: Alert and oriented to person, place, and time. Exhibits normal muscle tone. Gait normal. Coordination normal.  Skin: Skin is warm and dry. No rash noted. Not diaphoretic. No erythema. No pallor.  Psychiatric: Mood, memory and judgment normal.  Vitals reviewed.  LABORATORY DATA: Lab Results  Component Value Date   WBC 7.4 12/16/2019   HGB 13.2 12/16/2019   HCT 39.4 12/16/2019   MCV 92.9 12/16/2019   PLT 268 12/16/2019      Chemistry      Component Value Date/Time   NA 139 12/16/2019 0850   NA 139 11/24/2017 0913   K 3.9 12/16/2019 0850   K 3.8 11/24/2017 0913   CL 105 12/16/2019 0850   CO2 26 12/16/2019 0850   CO2 24 11/24/2017 0913   BUN 23 12/16/2019 0850   BUN 22.1 11/24/2017 0913   CREATININE 1.14 (H) 12/16/2019 0850   CREATININE 1.2 (H) 11/24/2017 0913      Component Value Date/Time   CALCIUM 8.9 12/16/2019 0850   CALCIUM 9.7 11/24/2017 0913   ALKPHOS 109 12/16/2019 0850   ALKPHOS 99 11/24/2017 0913   AST 14 (L) 12/16/2019 0850   AST 15 11/24/2017 0913   ALT 9 12/16/2019 0850   ALT 7 11/24/2017 0913   BILITOT 0.3 12/16/2019 0850   BILITOT 0.37 11/24/2017 0913       RADIOGRAPHIC STUDIES:  CT Chest W Contrast  Result Date: 12/09/2019 CLINICAL DATA:  Small-cell lung cancer. EXAM: CT CHEST, ABDOMEN, AND PELVIS WITH CONTRAST TECHNIQUE: Multidetector CT imaging of the chest, abdomen and pelvis was performed following the standard protocol during bolus administration of intravenous contrast. CONTRAST:  158mL OMNIPAQUE IOHEXOL 300 MG/ML  SOLN COMPARISON:  05/17/2019 FINDINGS: CT CHEST FINDINGS Cardiovascular: The heart size is normal. No substantial pericardial effusion. Coronary  artery calcification is evident. Atherosclerotic calcification is noted in the wall of the thoracic aorta. Right Port-A-Cath tip is positioned in the mid SVC. Filling defect seen in the SVC at the Port-A-Cath tip on the prior study is no longer evident. Mediastinum/Nodes: Calcified nodal tissue in the mediastinum and right hilum is similar to prior. The amorphous abnormal soft tissue in the right hilum is similar with stable mass-effect on the bronchus intermedius. No left hilar lymphadenopathy. There is no axillary lymphadenopathy. Lungs/Pleura: Centrilobular emphsyema noted. Right parahilar scarring is compatible with prior radiation treatment. Calcified granuloma again noted right middle lobe. Tiny right lower lobe paraspinal ground-glass opacity (image 62/series 6) is unchanged  no new suspicious pulmonary nodule or mass. No focal airspace consolidation. No pleural effusion. Musculoskeletal: No worrisome lytic or sclerotic osseous abnormality. CT ABDOMEN PELVIS FINDINGS Hepatobiliary: 11 mm hypodensity in the subcapsular aspect of segment IV posteriorly was probably present previously but is more conspicuous today. Liver otherwise unremarkable. There is no evidence for gallstones, gallbladder wall thickening, or pericholecystic fluid. No intrahepatic or extrahepatic biliary dilation. Pancreas: No focal mass lesion. No dilatation of the main duct. No intraparenchymal cyst. No peripancreatic edema. Spleen: Calcified granulomata. Adrenals/Urinary Tract: Left adrenal gland unremarkable. 3.5 cm right adrenal lesion is stable since 10/21/2016 and is compatible with benign adrenal adenoma. Tiny hypoattenuating foci in the cortex of each kidney are too small to characterize but likely benign. No evidence for hydroureter. The urinary bladder appears normal for the degree of distention. Stomach/Bowel: Stomach is unremarkable. No gastric wall thickening. No evidence of outlet obstruction. Duodenum is normally positioned as  is the ligament of Treitz. No small bowel wall thickening. No small bowel dilatation. The terminal ileum is normal. The appendix is not visualized, but there is no edema or inflammation in the region of the cecum. No gross colonic mass. No colonic wall thickening. Vascular/Lymphatic: There is abdominal aortic atherosclerosis without aneurysm. There is no gastrohepatic or hepatoduodenal ligament lymphadenopathy. No retroperitoneal or mesenteric lymphadenopathy. No pelvic sidewall lymphadenopathy. Reproductive: The uterus is unremarkable.  There is no adnexal mass. Other: No intraperitoneal free fluid. Musculoskeletal: No worrisome lytic or sclerotic osseous abnormality. IMPRESSION: 1. Stable exam.  No new or progressive interval findings. 2. Treatment related changes in the right hilum. Continued close attention on follow-up recommended. 3. Stable right adrenal adenoma. 4.  Aortic Atherosclerois (ICD10-170.0) 5.  Emphysema. ([id number]) Electronically Signed   By: Misty Stanley M.D.   On: 12/09/2019 16:15   CT Abdomen Pelvis W Contrast  Result Date: 12/09/2019 CLINICAL DATA:  Small-cell lung cancer. EXAM: CT CHEST, ABDOMEN, AND PELVIS WITH CONTRAST TECHNIQUE: Multidetector CT imaging of the chest, abdomen and pelvis was performed following the standard protocol during bolus administration of intravenous contrast. CONTRAST:  128mL OMNIPAQUE IOHEXOL 300 MG/ML  SOLN COMPARISON:  05/17/2019 FINDINGS: CT CHEST FINDINGS Cardiovascular: The heart size is normal. No substantial pericardial effusion. Coronary artery calcification is evident. Atherosclerotic calcification is noted in the wall of the thoracic aorta. Right Port-A-Cath tip is positioned in the mid SVC. Filling defect seen in the SVC at the Port-A-Cath tip on the prior study is no longer evident. Mediastinum/Nodes: Calcified nodal tissue in the mediastinum and right hilum is similar to prior. The amorphous abnormal soft tissue in the right hilum is similar  with stable mass-effect on the bronchus intermedius. No left hilar lymphadenopathy. There is no axillary lymphadenopathy. Lungs/Pleura: Centrilobular emphsyema noted. Right parahilar scarring is compatible with prior radiation treatment. Calcified granuloma again noted right middle lobe. Tiny right lower lobe paraspinal ground-glass opacity (image 62/series 6) is unchanged no new suspicious pulmonary nodule or mass. No focal airspace consolidation. No pleural effusion. Musculoskeletal: No worrisome lytic or sclerotic osseous abnormality. CT ABDOMEN PELVIS FINDINGS Hepatobiliary: 11 mm hypodensity in the subcapsular aspect of segment IV posteriorly was probably present previously but is more conspicuous today. Liver otherwise unremarkable. There is no evidence for gallstones, gallbladder wall thickening, or pericholecystic fluid. No intrahepatic or extrahepatic biliary dilation. Pancreas: No focal mass lesion. No dilatation of the main duct. No intraparenchymal cyst. No peripancreatic edema. Spleen: Calcified granulomata. Adrenals/Urinary Tract: Left adrenal gland unremarkable. 3.5 cm right adrenal lesion is stable since 10/21/2016  and is compatible with benign adrenal adenoma. Tiny hypoattenuating foci in the cortex of each kidney are too small to characterize but likely benign. No evidence for hydroureter. The urinary bladder appears normal for the degree of distention. Stomach/Bowel: Stomach is unremarkable. No gastric wall thickening. No evidence of outlet obstruction. Duodenum is normally positioned as is the ligament of Treitz. No small bowel wall thickening. No small bowel dilatation. The terminal ileum is normal. The appendix is not visualized, but there is no edema or inflammation in the region of the cecum. No gross colonic mass. No colonic wall thickening. Vascular/Lymphatic: There is abdominal aortic atherosclerosis without aneurysm. There is no gastrohepatic or hepatoduodenal ligament lymphadenopathy. No  retroperitoneal or mesenteric lymphadenopathy. No pelvic sidewall lymphadenopathy. Reproductive: The uterus is unremarkable.  There is no adnexal mass. Other: No intraperitoneal free fluid. Musculoskeletal: No worrisome lytic or sclerotic osseous abnormality. IMPRESSION: 1. Stable exam.  No new or progressive interval findings. 2. Treatment related changes in the right hilum. Continued close attention on follow-up recommended. 3. Stable right adrenal adenoma. 4.  Aortic Atherosclerois (ICD10-170.0) 5.  Emphysema. ([id number]) Electronically Signed   By: Misty Stanley M.D.   On: 12/09/2019 16:15     ASSESSMENT/PLAN:  This is a very pleasant 68 year old Caucasian female with recurrent small cell lung cancer.She was initially diagnosed as limited stage (T2b, N2, M0)in 2017. She presented with alarge right hilar mass with mediastinal invasion and a right lower lobe pulmonary nodule.She is status post a course of systemic chemotherapy with cisplatin and etoposide for 6 cycles with concurrent radiation. She tolerated treatment well except for fatigue.  She also received prophylactic cranial irradiation. She had been on observation since March 2018.  Recent imaging studies from February 2020 showed evidence of disease recurrence within right lower lobe pulmonary nodules in addition to mediastinal lymphadenopathy. A repeat bronchoscopy with endobronchial ultrasound and biopsy confirmed recurrence of small cell lung cancer.  She is currently undergoing systemic chemotherapy with carboplatin, etoposide, and Tecentriq. She is status post 9 cycles. Starting from cycle #5 the patient has beenreceiving single agent maintenance Tecentriq. She tolerated treatment well except for itching and fatigue.   The patient recently had a restaging CT scan performed. Dr. Julien Nordmann personally and independently reviewed the scan and discussed the results with the patient today. The scan did not show any evidence for  disease progression. Recommend that she proceed with cycle #10 today as scheduled.   We will see her back for a follow up visit in 3 weeks for evaluation before starting cycle #11.   She was instructed to take her 75 mcg of synthroid p.o. daily as instructed. This was recently increased from 50 mcg earlier this month and she was not taking her new prescription as instructed due to not being able to find the correct prescription. She is now taking it as prescribed. We will continue to monitor her TSH closely and make dose adjustments as needed.   The patient was advised to call immediately if she has any concerning symptoms in the interval. The patient voices understanding of current disease status and treatment options and is in agreement with the current care plan. All questions were answered. The patient knows to call the clinic with any problems, questions or concerns. We can certainly see the patient much sooner if necessary   No orders of the defined types were placed in this encounter.    Dekari Bures L Loyda Costin, PA-C 12/16/19  ADDENDUM: Hematology/Oncology Attending: I had a face-to-face encounter with  the patient today.  I recommended her care plan.  This is a very pleasant 68 years old white female with extensive stage small cell lung cancer status post systemic chemotherapy with carboplatin, etoposide and Tecentriq for 4 cycles. This is currently followed by maintenance treatment with single agent Tecentriq status post 5 cycles. The patient has been tolerating this treatment well with no concerning adverse effects. She had repeat CT scan of the chest, abdomen pelvis performed recently. I personally and independently reviewed the scans and discussed the results with the patient today. Her scan showed no concerning findings for disease progression. I recommend for the patient to continue her current maintenance treatment with Tecentriq and she will proceed with cycle #10  today. She will come back for follow-up visit in 3 weeks for evaluation before starting cycle #11. The patient was advised to call immediately if she has any concerning symptoms in the interval.  Disclaimer: This note was dictated with voice recognition software. Similar sounding words can inadvertently be transcribed and may be missed upon review. Eilleen Kempf, MD 12/16/19

## 2019-12-16 ENCOUNTER — Inpatient Hospital Stay (HOSPITAL_BASED_OUTPATIENT_CLINIC_OR_DEPARTMENT_OTHER): Payer: Medicare Other | Admitting: Physician Assistant

## 2019-12-16 ENCOUNTER — Inpatient Hospital Stay: Payer: Medicare Other

## 2019-12-16 ENCOUNTER — Encounter: Payer: Self-pay | Admitting: Physician Assistant

## 2019-12-16 ENCOUNTER — Other Ambulatory Visit: Payer: Self-pay

## 2019-12-16 ENCOUNTER — Telehealth: Payer: Self-pay | Admitting: Physician Assistant

## 2019-12-16 VITALS — BP 126/76 | HR 72 | Temp 98.5°F | Resp 16

## 2019-12-16 DIAGNOSIS — Z5112 Encounter for antineoplastic immunotherapy: Secondary | ICD-10-CM | POA: Diagnosis not present

## 2019-12-16 DIAGNOSIS — C3491 Malignant neoplasm of unspecified part of right bronchus or lung: Secondary | ICD-10-CM

## 2019-12-16 DIAGNOSIS — R5382 Chronic fatigue, unspecified: Secondary | ICD-10-CM

## 2019-12-16 LAB — CBC WITH DIFFERENTIAL (CANCER CENTER ONLY)
Abs Immature Granulocytes: 0.01 10*3/uL (ref 0.00–0.07)
Basophils Absolute: 0.1 10*3/uL (ref 0.0–0.1)
Basophils Relative: 1 %
Eosinophils Absolute: 0.2 10*3/uL (ref 0.0–0.5)
Eosinophils Relative: 3 %
HCT: 39.4 % (ref 36.0–46.0)
Hemoglobin: 13.2 g/dL (ref 12.0–15.0)
Immature Granulocytes: 0 %
Lymphocytes Relative: 12 %
Lymphs Abs: 0.9 10*3/uL (ref 0.7–4.0)
MCH: 31.1 pg (ref 26.0–34.0)
MCHC: 33.5 g/dL (ref 30.0–36.0)
MCV: 92.9 fL (ref 80.0–100.0)
Monocytes Absolute: 0.5 10*3/uL (ref 0.1–1.0)
Monocytes Relative: 7 %
Neutro Abs: 5.8 10*3/uL (ref 1.7–7.7)
Neutrophils Relative %: 77 %
Platelet Count: 268 10*3/uL (ref 150–400)
RBC: 4.24 MIL/uL (ref 3.87–5.11)
RDW: 13.5 % (ref 11.5–15.5)
WBC Count: 7.4 10*3/uL (ref 4.0–10.5)
nRBC: 0 % (ref 0.0–0.2)

## 2019-12-16 LAB — CMP (CANCER CENTER ONLY)
ALT: 9 U/L (ref 0–44)
AST: 14 U/L — ABNORMAL LOW (ref 15–41)
Albumin: 3.5 g/dL (ref 3.5–5.0)
Alkaline Phosphatase: 109 U/L (ref 38–126)
Anion gap: 8 (ref 5–15)
BUN: 23 mg/dL (ref 8–23)
CO2: 26 mmol/L (ref 22–32)
Calcium: 8.9 mg/dL (ref 8.9–10.3)
Chloride: 105 mmol/L (ref 98–111)
Creatinine: 1.14 mg/dL — ABNORMAL HIGH (ref 0.44–1.00)
GFR, Est AFR Am: 58 mL/min — ABNORMAL LOW (ref >60)
GFR, Estimated: 50 mL/min — ABNORMAL LOW (ref >60)
Glucose, Bld: 77 mg/dL (ref 70–99)
Potassium: 3.9 mmol/L (ref 3.5–5.1)
Sodium: 139 mmol/L (ref 135–145)
Total Bilirubin: 0.3 mg/dL (ref 0.3–1.2)
Total Protein: 6.7 g/dL (ref 6.5–8.1)

## 2019-12-16 LAB — TSH: TSH: 14.246 u[IU]/mL — ABNORMAL HIGH (ref 0.308–3.960)

## 2019-12-16 MED ORDER — SODIUM CHLORIDE 0.9% FLUSH
10.0000 mL | INTRAVENOUS | Status: DC | PRN
  Administered 2019-12-16: 10 mL
  Filled 2019-12-16: qty 10

## 2019-12-16 MED ORDER — SODIUM CHLORIDE 0.9 % IV SOLN
1200.0000 mg | Freq: Once | INTRAVENOUS | Status: AC
  Administered 2019-12-16: 1200 mg via INTRAVENOUS
  Filled 2019-12-16: qty 20

## 2019-12-16 MED ORDER — SODIUM CHLORIDE 0.9 % IV SOLN
Freq: Once | INTRAVENOUS | Status: AC
  Filled 2019-12-16: qty 250

## 2019-12-16 MED ORDER — HEPARIN SOD (PORK) LOCK FLUSH 100 UNIT/ML IV SOLN
500.0000 [IU] | Freq: Once | INTRAVENOUS | Status: AC | PRN
  Administered 2019-12-16: 500 [IU]
  Filled 2019-12-16: qty 5

## 2019-12-16 NOTE — Patient Instructions (Signed)
[facility] Discharge Instructions for Patients Receiving Chemotherapy  Today you received the following chemotherapy agents:  Atezolizumab  To help prevent nausea and vomiting after your treatment, we encourage you to take your nausea medication as prescribed.   If you develop nausea and vomiting that is not controlled by your nausea medication, call the clinic.   BELOW ARE SYMPTOMS THAT SHOULD BE REPORTED IMMEDIATELY:  *FEVER GREATER THAN 100.5 F  *CHILLS WITH OR WITHOUT FEVER  NAUSEA AND VOMITING THAT IS NOT CONTROLLED WITH YOUR NAUSEA MEDICATION  *UNUSUAL SHORTNESS OF BREATH  *UNUSUAL BRUISING OR BLEEDING  TENDERNESS IN MOUTH AND THROAT WITH OR WITHOUT PRESENCE OF ULCERS  *URINARY PROBLEMS  *BOWEL PROBLEMS  UNUSUAL RASH Items with * indicate a potential emergency and should be followed up as soon as possible.  Feel free to call the clinic should you have any questions or concerns. The clinic phone number is [phone].  Please show the [facility] at check-in to the Emergency Department and triage nurse.

## 2019-12-16 NOTE — Progress Notes (Signed)
Positional issues with PCA.  Attempted to lay patient flat on right side as previously noted for success and found was unsuccessful with getting good blood flow.

## 2019-12-16 NOTE — Telephone Encounter (Signed)
Scheduled per los. Gave printout

## 2019-12-30 NOTE — Progress Notes (Addendum)
[facility] OFFICE PROGRESS NOTE  Koirala, Dibas, MD [place] [address] [id number]  DIAGNOSIS: Recurrent small cell lung cancer initially diagnosed as Limited stage (T2b, N2, M0) small cell lung cancer diagnosed in November 2017 and presented with large right hilar mass with mediastinal invasion and a right lower lobe pulmonary nodule. The patient had disease recurrence in the right lower lobe and mediastinal lymph nodes in February 2020.  PRIOR THERAPY:  1) Systemic chemotherapy with cisplatin 60 MG/M2 on day 1 and etoposide 120 MG/M2 on days 1, 2 and 3 concurrent with radiation status post 6 cycles. Last dose was giving March 06, 2017. 2) status post prophylactic cranial irradiation.  CURRENT THERAPY: Systemic chemotherapy with carboplatin for AUC of 5 on day 1, etoposide. 100 mg/M2 on days 1, 2 and 3 as well as Tecentriq 1200 mg IV with Neulasta support every 3 weeks. First dose March 05, 2019. Status post 10cycles. Starting from cycle 5, the patient will be treated with single agent Tecentriq 1200 mg IV every 3 weeks. First dose May 28, 2019.  INTERVAL HISTORY: Tammy Bowers 69 y.o. female returns to the clinic for a follow up visit. The patient is feeling well today without any concerning complaints. The patient continues to tolerate treatment with tecentriq well without any adverse effects. Denies any fever, chills, night sweats, or weight loss. Denies any chest pain, shortness of breath, or hemoptysis. She reports her baseline cough for which she uses her inhalers as well as an OTC cough medication. She continues to smoke cigarettes. Denies any vomiting, constipation, or diarrhea but endorses occasional nausea. She states she is experiencing reflux but she is not interested in taking OTC medications for this at this time. Denies any headache, visual changes, changes in her gait, or seizures at this time. She had imaging of her brain in October 2020  while in the emergency department in [place] which did not show any concerning findings at that time for metastatic disease to the brain. She denies any rashes or skin changes. There was some error with the patient's appointments today. She is not due for cycle #11 until next week. She is here for evaluation before starting cycle #11 next week as scheduled.   MEDICAL HISTORY: Past Medical History:  Diagnosis Date  . Adenopathy 10/10/2016   PERICARINAL  . Anemia   . Arthritis   . Asthma   . Cancer ([facility])    lung cancer Small Cell  . COPD (chronic obstructive pulmonary disease) ([facility])   . Dyspnea   . Headache    migraines  . Hilar mass 10/10/2016   RIGHT  . History of kidney stones   . Insomnia   . Lung nodules 10/10/2016   RIGHT LOWER LOBE  . Mass of both adrenal glands ([facility]) 10/10/2016  . Mood swing   . Pneumonia   . Spinal headache   . Substance abuse ([facility])    TOBACCO  . UNSPECIFIED INFECTION OF BONE ANKLE AND FOOT 10/22/2009   Annotation: left ankle Qualifier: Diagnosis of  By: Patsy Baltimore RN, Denise      ALLERGIES:  is allergic to lidocaine.  MEDICATIONS:  Current Outpatient Medications  Medication Sig Dispense Refill  . albuterol (PROVENTIL HFA;VENTOLIN HFA) 108 (90 BASE) MCG/ACT inhaler Inhale 2 puffs into the lungs every 4 (four) hours as needed for wheezing.    . Cyclobenzaprine HCl (FLEXERIL PO) Take 5 mg by mouth 3 (three) times daily as needed (muscle spasms).     Marland Kitchen  diphenoxylate-atropine (LOMOTIL) 2.5-0.025 MG tablet Take 1 tablet by mouth 4 (four) times daily as needed for diarrhea or loose stools. 30 tablet 0  . Fluticasone-Salmeterol (ADVAIR) 500-50 MCG/DOSE AEPB Inhale 1 puff into the lungs 2 (two) times daily.    Marland Kitchen ibuprofen (ADVIL) 800 MG tablet Take 800 mg by mouth every 8 (eight) hours as needed.    Marland Kitchen levothyroxine (SYNTHROID) 75 MCG tablet TAKE 1 TABLET(75 MCG) BY MOUTH DAILY Pt lost her previous prescription. 90 tablet 0  . LORazepam (ATIVAN) 0.5 MG tablet  Take 1 tablet (0.5 mg total) by mouth at bedtime. (Patient taking differently: Take 0.5 mg by mouth at bedtime as needed for anxiety. ) 20 tablet 0  . Multiple Vitamin (MULTIVITAMIN WITH MINERALS) TABS tablet Take 1 tablet by mouth daily.     . prochlorperazine (COMPAZINE) 10 MG tablet TAKE 1 TABLET(10 MG) BY MOUTH EVERY 6 HOURS AS NEEDED FOR NAUSEA OR VOMITING 30 tablet 0  . sertraline (ZOLOFT) 50 MG tablet Take 50 mg by mouth at bedtime.  5   No current facility-administered medications for this visit.   Facility-Administered Medications Ordered in Other Visits  Medication Dose Route Frequency Provider Last Rate Last Admin  . atezolizumab (TECENTRIQ) 1,200 mg in sodium chloride 0.9 % 250 mL chemo infusion  1,200 mg Intravenous Once Curt Bears, MD      . heparin lock flush 100 unit/mL  500 Units Intracatheter Once PRN Curt Bears, MD      . pegfilgrastim-cbqv [facility]) injection 6 mg  6 mg Subcutaneous Once Curt Bears, MD      . sodium chloride flush (NS) 0.9 % injection 10 mL  10 mL Intracatheter PRN Curt Bears, MD        SURGICAL HISTORY:  Past Surgical History:  Procedure Laterality Date  . ANKLE SURGERY     hardware removal, and initial ankle surgery  . APPENDECTOMY    . IR IMAGING GUIDED PORT INSERTION  04/12/2019  . LUNG BIOPSY N/A 10/19/2016   Procedure: LUNG BIOPSY;  Surgeon: Grace Isaac, MD;  Location: [facility];  Service: Thoracic;  Laterality: N/A;  . LUNG BIOPSY N/A 02/20/2019   Procedure: LUNG BIOPSY;  Surgeon: Grace Isaac, MD;  Location: [facility];  Service: Thoracic;  Laterality: N/A;  . TONSILLECTOMY    . VIDEO BRONCHOSCOPY WITH ENDOBRONCHIAL ULTRASOUND N/A 10/19/2016   Procedure: VIDEO BRONCHOSCOPY WITH ENDOBRONCHIAL ULTRASOUND;  Surgeon: Grace Isaac, MD;  Location: [facility];  Service: Thoracic;  Laterality: N/A;  . VIDEO BRONCHOSCOPY WITH ENDOBRONCHIAL ULTRASOUND N/A 02/20/2019   Procedure: VIDEO BRONCHOSCOPY WITH ENDOBRONCHIAL ULTRASOUND;   Surgeon: Grace Isaac, MD;  Location: [facility];  Service: Thoracic;  Laterality: N/A;    REVIEW OF SYSTEMS:   Review of Systems  Constitutional: Negative for appetite change, chills, fatigue, fever and unexpected weight change.  HENT: Negative for mouth sores, nosebleeds, sore throat and trouble swallowing.  Eyes: Negative for eye problems and icterus.  Respiratory:Positive for baseline cough.Negative for hemoptysis, shortness of breath and wheezing.  Cardiovascular: Negative for chest pain and leg swelling.  Gastrointestinal: Negative for abdominal pain, constipation, diarrhea, nausea and vomiting.  Genitourinary: Negative for bladder incontinence, difficulty urinating, dysuria, frequency and hematuria.  Musculoskeletal: Negative for back pain, gait problem, neck pain and neck stiffness.  Skin: Negative for itching and rash.  Neurological: Negative for dizziness, extremity weakness, gait problem, headaches, light-headedness and seizures.  Hematological: Negative for adenopathy. Does not bruise/bleed easily.  Psychiatric/Behavioral: Negative for confusion, depression and  sleep disturbance. The patient is not nervous/anxious.     PHYSICAL EXAMINATION:  Blood pressure 111/85, pulse 95, temperature 97.8 F (36.6 C), temperature source Temporal, resp. rate 17, height 5\' 6"  (1.676 m), weight 205 lb 14.4 oz (93.4 kg), SpO2 98 %.  ECOG PERFORMANCE STATUS: 1 - Symptomatic but completely ambulatory  Physical Exam  Constitutional: Oriented to person, place, and time and well-developed, well-nourished, and in no distress.  HENT:  Head: Normocephalic and atraumatic.  Mouth/Throat: Oropharynx is clear and moist. No oropharyngeal exudate.  Eyes: Conjunctivae are normal. Right eye exhibits no discharge. Left eye exhibits no discharge. No scleral icterus.  Neck: Normal range of motion. Neck supple.  Cardiovascular: Normal rate, regular rhythm, normal heart sounds and intact distal pulses.    Pulmonary/Chest: Effort normal. Wheezing in all lung fields. No respiratory distress. No rales.  Abdominal: Soft. Bowel sounds are normal. Exhibits no distension and no mass. There is no tenderness.  Musculoskeletal: Normal range of motion. Exhibits no edema.  Lymphadenopathy:    No cervical adenopathy.  Neurological: Alert and oriented to person, place, and time. Exhibits normal muscle tone. Gait normal. Coordination normal.  Skin: Skin is warm and dry. No rash noted. Not diaphoretic. No erythema. No pallor.  Psychiatric: Mood, memory and judgment normal.  Vitals reviewed.  LABORATORY DATA: Lab Results  Component Value Date   WBC 6.8 12/31/2019   HGB 13.3 12/31/2019   HCT 40.1 12/31/2019   MCV 95.0 12/31/2019   PLT 282 12/31/2019      Chemistry      Component Value Date/Time   NA 141 12/31/2019 1116   NA 139 11/24/2017 0913   K 3.3 (L) 12/31/2019 1116   K 3.8 11/24/2017 0913   CL 106 12/31/2019 1116   CO2 24 12/31/2019 1116   CO2 24 11/24/2017 0913   BUN 18 12/31/2019 1116   BUN 22.1 11/24/2017 0913   CREATININE 1.00 12/31/2019 1116   CREATININE 1.2 (H) 11/24/2017 0913      Component Value Date/Time   CALCIUM 8.6 (L) 12/31/2019 1116   CALCIUM 9.7 11/24/2017 0913   ALKPHOS 113 12/31/2019 1116   ALKPHOS 99 11/24/2017 0913   AST 13 (L) 12/31/2019 1116   AST 15 11/24/2017 0913   ALT 9 12/31/2019 1116   ALT 7 11/24/2017 0913   BILITOT 0.3 12/31/2019 1116   BILITOT 0.37 11/24/2017 0913       RADIOGRAPHIC STUDIES:  CT Chest W Contrast  Result Date: 12/09/2019 CLINICAL DATA:  Small-cell lung cancer. EXAM: CT CHEST, ABDOMEN, AND PELVIS WITH CONTRAST TECHNIQUE: Multidetector CT imaging of the chest, abdomen and pelvis was performed following the standard protocol during bolus administration of intravenous contrast. CONTRAST:  13mL OMNIPAQUE IOHEXOL 300 MG/ML  SOLN COMPARISON:  05/17/2019 FINDINGS: CT CHEST FINDINGS Cardiovascular: The heart size is normal. No  substantial pericardial effusion. Coronary artery calcification is evident. Atherosclerotic calcification is noted in the wall of the thoracic aorta. Right Port-A-Cath tip is positioned in the mid SVC. Filling defect seen in the SVC at the Port-A-Cath tip on the prior study is no longer evident. Mediastinum/Nodes: Calcified nodal tissue in the mediastinum and right hilum is similar to prior. The amorphous abnormal soft tissue in the right hilum is similar with stable mass-effect on the bronchus intermedius. No left hilar lymphadenopathy. There is no axillary lymphadenopathy. Lungs/Pleura: Centrilobular emphsyema noted. Right parahilar scarring is compatible with prior radiation treatment. Calcified granuloma again noted right middle lobe. Tiny right lower lobe paraspinal ground-glass opacity (  image 62/series 6) is unchanged no new suspicious pulmonary nodule or mass. No focal airspace consolidation. No pleural effusion. Musculoskeletal: No worrisome lytic or sclerotic osseous abnormality. CT ABDOMEN PELVIS FINDINGS Hepatobiliary: 11 mm hypodensity in the subcapsular aspect of segment IV posteriorly was probably present previously but is more conspicuous today. Liver otherwise unremarkable. There is no evidence for gallstones, gallbladder wall thickening, or pericholecystic fluid. No intrahepatic or extrahepatic biliary dilation. Pancreas: No focal mass lesion. No dilatation of the main duct. No intraparenchymal cyst. No peripancreatic edema. Spleen: Calcified granulomata. Adrenals/Urinary Tract: Left adrenal gland unremarkable. 3.5 cm right adrenal lesion is stable since 10/21/2016 and is compatible with benign adrenal adenoma. Tiny hypoattenuating foci in the cortex of each kidney are too small to characterize but likely benign. No evidence for hydroureter. The urinary bladder appears normal for the degree of distention. Stomach/Bowel: Stomach is unremarkable. No gastric wall thickening. No evidence of outlet  obstruction. Duodenum is normally positioned as is the ligament of Treitz. No small bowel wall thickening. No small bowel dilatation. The terminal ileum is normal. The appendix is not visualized, but there is no edema or inflammation in the region of the cecum. No gross colonic mass. No colonic wall thickening. Vascular/Lymphatic: There is abdominal aortic atherosclerosis without aneurysm. There is no gastrohepatic or hepatoduodenal ligament lymphadenopathy. No retroperitoneal or mesenteric lymphadenopathy. No pelvic sidewall lymphadenopathy. Reproductive: The uterus is unremarkable.  There is no adnexal mass. Other: No intraperitoneal free fluid. Musculoskeletal: No worrisome lytic or sclerotic osseous abnormality. IMPRESSION: 1. Stable exam.  No new or progressive interval findings. 2. Treatment related changes in the right hilum. Continued close attention on follow-up recommended. 3. Stable right adrenal adenoma. 4.  Aortic Atherosclerois (ICD10-170.0) 5.  Emphysema. ([id number]) Electronically Signed   By: Misty Stanley M.D.   On: 12/09/2019 16:15   CT Abdomen Pelvis W Contrast  Result Date: 12/09/2019 CLINICAL DATA:  Small-cell lung cancer. EXAM: CT CHEST, ABDOMEN, AND PELVIS WITH CONTRAST TECHNIQUE: Multidetector CT imaging of the chest, abdomen and pelvis was performed following the standard protocol during bolus administration of intravenous contrast. CONTRAST:  134mL OMNIPAQUE IOHEXOL 300 MG/ML  SOLN COMPARISON:  05/17/2019 FINDINGS: CT CHEST FINDINGS Cardiovascular: The heart size is normal. No substantial pericardial effusion. Coronary artery calcification is evident. Atherosclerotic calcification is noted in the wall of the thoracic aorta. Right Port-A-Cath tip is positioned in the mid SVC. Filling defect seen in the SVC at the Port-A-Cath tip on the prior study is no longer evident. Mediastinum/Nodes: Calcified nodal tissue in the mediastinum and right hilum is similar to prior. The amorphous  abnormal soft tissue in the right hilum is similar with stable mass-effect on the bronchus intermedius. No left hilar lymphadenopathy. There is no axillary lymphadenopathy. Lungs/Pleura: Centrilobular emphsyema noted. Right parahilar scarring is compatible with prior radiation treatment. Calcified granuloma again noted right middle lobe. Tiny right lower lobe paraspinal ground-glass opacity (image 62/series 6) is unchanged no new suspicious pulmonary nodule or mass. No focal airspace consolidation. No pleural effusion. Musculoskeletal: No worrisome lytic or sclerotic osseous abnormality. CT ABDOMEN PELVIS FINDINGS Hepatobiliary: 11 mm hypodensity in the subcapsular aspect of segment IV posteriorly was probably present previously but is more conspicuous today. Liver otherwise unremarkable. There is no evidence for gallstones, gallbladder wall thickening, or pericholecystic fluid. No intrahepatic or extrahepatic biliary dilation. Pancreas: No focal mass lesion. No dilatation of the main duct. No intraparenchymal cyst. No peripancreatic edema. Spleen: Calcified granulomata. Adrenals/Urinary Tract: Left adrenal gland unremarkable. 3.5 cm right adrenal  lesion is stable since 10/21/2016 and is compatible with benign adrenal adenoma. Tiny hypoattenuating foci in the cortex of each kidney are too small to characterize but likely benign. No evidence for hydroureter. The urinary bladder appears normal for the degree of distention. Stomach/Bowel: Stomach is unremarkable. No gastric wall thickening. No evidence of outlet obstruction. Duodenum is normally positioned as is the ligament of Treitz. No small bowel wall thickening. No small bowel dilatation. The terminal ileum is normal. The appendix is not visualized, but there is no edema or inflammation in the region of the cecum. No gross colonic mass. No colonic wall thickening. Vascular/Lymphatic: There is abdominal aortic atherosclerosis without aneurysm. There is no  gastrohepatic or hepatoduodenal ligament lymphadenopathy. No retroperitoneal or mesenteric lymphadenopathy. No pelvic sidewall lymphadenopathy. Reproductive: The uterus is unremarkable.  There is no adnexal mass. Other: No intraperitoneal free fluid. Musculoskeletal: No worrisome lytic or sclerotic osseous abnormality. IMPRESSION: 1. Stable exam.  No new or progressive interval findings. 2. Treatment related changes in the right hilum. Continued close attention on follow-up recommended. 3. Stable right adrenal adenoma. 4.  Aortic Atherosclerois (ICD10-170.0) 5.  Emphysema. ([id number]) Electronically Signed   By: Misty Stanley M.D.   On: 12/09/2019 16:15     ASSESSMENT/PLAN:  This is a very pleasant 69 year old Caucasian female with recurrent small cell lung cancer.She was initially diagnosed as limited stage (T2b, N2, M0)in 2017. She presented with alarge right hilar mass with mediastinal invasion and a right lower lobe pulmonary nodule.She is status post a course of systemic chemotherapy with cisplatin and etoposide for 6 cycles with concurrent radiation. She tolerated treatment well except for fatigue.  She also received prophylactic cranial irradiation. She had been on observation since March 2018.  Recent imaging studies from February 2020 showed evidence of disease recurrence within right lower lobe pulmonary nodules in addition to mediastinal lymphadenopathy. A repeat bronchoscopy with endobronchial ultrasound and biopsy confirmed recurrence of small cell lung cancer.  She is currently undergoing systemic chemotherapy with carboplatin, etoposide, and Tecentriq. She is status post 10 cycles. Starting from cycle #5 the patient has beenreceiving single agent maintenance Tecentriq. She tolerated treatment well except for itching and fatigue.   Labs were reviewed. The patient's infusion appointment is scheduled 1 week too early. She will return next week for cycle #11.   She will  continue to take her synthroid as prescribed.   Her potassium was slightly low today. She was given a hand out on potassium rich foods.   The patient was advised to call immediately if she has any concerning symptoms in the interval. The patient voices understanding of current disease status and treatment options and is in agreement with the current care plan. All questions were answered. The patient knows to call the clinic with any problems, questions or concerns. We can certainly see the patient much sooner if necessary  Orders Placed This Encounter  Procedures  . CBC with Differential (Cancer Center Only)    Standing Status:   Standing    Number of Occurrences:   18    Standing Expiration Date:   12/30/2020  . CMP ([facility] only)    Standing Status:   Standing    Number of Occurrences:   18    Standing Expiration Date:   12/30/2020     Eulonda Andalon L Parrie Rasco, PA-C 12/31/19

## 2019-12-31 ENCOUNTER — Other Ambulatory Visit: Payer: Self-pay

## 2019-12-31 ENCOUNTER — Inpatient Hospital Stay: Payer: Medicare Other

## 2019-12-31 ENCOUNTER — Other Ambulatory Visit: Payer: Medicaid Other

## 2019-12-31 ENCOUNTER — Inpatient Hospital Stay: Payer: Medicare Other | Attending: Internal Medicine | Admitting: Physician Assistant

## 2019-12-31 ENCOUNTER — Encounter: Payer: Self-pay | Admitting: Physician Assistant

## 2019-12-31 ENCOUNTER — Telehealth: Payer: Self-pay | Admitting: Physician Assistant

## 2019-12-31 VITALS — BP 111/85 | HR 95 | Temp 97.8°F | Resp 17 | Ht 66.0 in | Wt 205.9 lb

## 2019-12-31 DIAGNOSIS — F17210 Nicotine dependence, cigarettes, uncomplicated: Secondary | ICD-10-CM | POA: Insufficient documentation

## 2019-12-31 DIAGNOSIS — Z923 Personal history of irradiation: Secondary | ICD-10-CM | POA: Diagnosis not present

## 2019-12-31 DIAGNOSIS — Z791 Long term (current) use of non-steroidal anti-inflammatories (NSAID): Secondary | ICD-10-CM | POA: Diagnosis not present

## 2019-12-31 DIAGNOSIS — R590 Localized enlarged lymph nodes: Secondary | ICD-10-CM | POA: Insufficient documentation

## 2019-12-31 DIAGNOSIS — J449 Chronic obstructive pulmonary disease, unspecified: Secondary | ICD-10-CM | POA: Diagnosis not present

## 2019-12-31 DIAGNOSIS — Z5112 Encounter for antineoplastic immunotherapy: Secondary | ICD-10-CM

## 2019-12-31 DIAGNOSIS — M1990 Unspecified osteoarthritis, unspecified site: Secondary | ICD-10-CM | POA: Insufficient documentation

## 2019-12-31 DIAGNOSIS — Z7951 Long term (current) use of inhaled steroids: Secondary | ICD-10-CM | POA: Diagnosis not present

## 2019-12-31 DIAGNOSIS — Z79899 Other long term (current) drug therapy: Secondary | ICD-10-CM | POA: Diagnosis not present

## 2019-12-31 DIAGNOSIS — Z9221 Personal history of antineoplastic chemotherapy: Secondary | ICD-10-CM | POA: Insufficient documentation

## 2019-12-31 DIAGNOSIS — C3431 Malignant neoplasm of lower lobe, right bronchus or lung: Secondary | ICD-10-CM | POA: Diagnosis not present

## 2019-12-31 DIAGNOSIS — Z87442 Personal history of urinary calculi: Secondary | ICD-10-CM | POA: Insufficient documentation

## 2019-12-31 DIAGNOSIS — Z95828 Presence of other vascular implants and grafts: Secondary | ICD-10-CM

## 2019-12-31 DIAGNOSIS — C3491 Malignant neoplasm of unspecified part of right bronchus or lung: Secondary | ICD-10-CM

## 2019-12-31 LAB — CMP (CANCER CENTER ONLY)
ALT: 9 U/L (ref 0–44)
AST: 13 U/L — ABNORMAL LOW (ref 15–41)
Albumin: 3.4 g/dL — ABNORMAL LOW (ref 3.5–5.0)
Alkaline Phosphatase: 113 U/L (ref 38–126)
Anion gap: 11 (ref 5–15)
BUN: 18 mg/dL (ref 8–23)
CO2: 24 mmol/L (ref 22–32)
Calcium: 8.6 mg/dL — ABNORMAL LOW (ref 8.9–10.3)
Chloride: 106 mmol/L (ref 98–111)
Creatinine: 1 mg/dL (ref 0.44–1.00)
GFR, Est AFR Am: >60 mL/min (ref >60)
GFR, Estimated: 59 mL/min — ABNORMAL LOW (ref >60)
Glucose, Bld: 67 mg/dL — ABNORMAL LOW (ref 70–99)
Potassium: 3.3 mmol/L — ABNORMAL LOW (ref 3.5–5.1)
Sodium: 141 mmol/L (ref 135–145)
Total Bilirubin: 0.3 mg/dL (ref 0.3–1.2)
Total Protein: 6.7 g/dL (ref 6.5–8.1)

## 2019-12-31 LAB — CBC WITH DIFFERENTIAL (CANCER CENTER ONLY)
Abs Immature Granulocytes: 0.02 10*3/uL (ref 0.00–0.07)
Basophils Absolute: 0.1 10*3/uL (ref 0.0–0.1)
Basophils Relative: 1 %
Eosinophils Absolute: 0.2 10*3/uL (ref 0.0–0.5)
Eosinophils Relative: 3 %
HCT: 40.1 % (ref 36.0–46.0)
Hemoglobin: 13.3 g/dL (ref 12.0–15.0)
Immature Granulocytes: 0 %
Lymphocytes Relative: 12 %
Lymphs Abs: 0.8 10*3/uL (ref 0.7–4.0)
MCH: 31.5 pg (ref 26.0–34.0)
MCHC: 33.2 g/dL (ref 30.0–36.0)
MCV: 95 fL (ref 80.0–100.0)
Monocytes Absolute: 0.5 10*3/uL (ref 0.1–1.0)
Monocytes Relative: 7 %
Neutro Abs: 5.2 10*3/uL (ref 1.7–7.7)
Neutrophils Relative %: 77 %
Platelet Count: 282 10*3/uL (ref 150–400)
RBC: 4.22 MIL/uL (ref 3.87–5.11)
RDW: 13.4 % (ref 11.5–15.5)
WBC Count: 6.8 10*3/uL (ref 4.0–10.5)
nRBC: 0 % (ref 0.0–0.2)

## 2019-12-31 MED ORDER — SODIUM CHLORIDE 0.9 % IV SOLN
Freq: Once | INTRAVENOUS | Status: AC
  Filled 2019-12-31: qty 250

## 2019-12-31 MED ORDER — SODIUM CHLORIDE 0.9% FLUSH
10.0000 mL | INTRAVENOUS | Status: DC | PRN
  Administered 2019-12-31: 10 mL
  Filled 2019-12-31: qty 10

## 2019-12-31 MED ORDER — HEPARIN SOD (PORK) LOCK FLUSH 100 UNIT/ML IV SOLN
500.0000 [IU] | Freq: Once | INTRAVENOUS | Status: AC | PRN
  Administered 2019-12-31: 500 [IU]
  Filled 2019-12-31: qty 5

## 2019-12-31 MED ORDER — SODIUM CHLORIDE 0.9 % IV SOLN: 1200.0000 mg | Freq: Once | INTRAVENOUS | Status: DC

## 2019-12-31 MED ORDER — SODIUM CHLORIDE 0.9% FLUSH
10.0000 mL | Freq: Once | INTRAVENOUS | Status: AC
  Administered 2019-12-31: 10 mL
  Filled 2019-12-31: qty 10

## 2019-12-31 NOTE — Progress Notes (Signed)
Treatment w/ Gildardo Pounds will be held today since last given 12/16/19. Cassie H, PA aware.  She will contact scheduling. Orders have been deferred to next week.  Rennis Harding, RN aware and will d/w pt.  Kennith Center, Pharm.D., CPP 12/31/2019@1 :01 PM

## 2019-12-31 NOTE — Telephone Encounter (Signed)
Scheduled appt per 1/12 sch message - unable to reach pt- left message with appt date and time

## 2019-12-31 NOTE — Patient Instructions (Signed)
[facility] Discharge Instructions for Patients Receiving Chemotherapy  Today you received the following chemotherapy agents:  Atezolizumab  To help prevent nausea and vomiting after your treatment, we encourage you to take your nausea medication as prescribed.   If you develop nausea and vomiting that is not controlled by your nausea medication, call the clinic.   BELOW ARE SYMPTOMS THAT SHOULD BE REPORTED IMMEDIATELY:  *FEVER GREATER THAN 100.5 F  *CHILLS WITH OR WITHOUT FEVER  NAUSEA AND VOMITING THAT IS NOT CONTROLLED WITH YOUR NAUSEA MEDICATION  *UNUSUAL SHORTNESS OF BREATH  *UNUSUAL BRUISING OR BLEEDING  TENDERNESS IN MOUTH AND THROAT WITH OR WITHOUT PRESENCE OF ULCERS  *URINARY PROBLEMS  *BOWEL PROBLEMS  UNUSUAL RASH Items with * indicate a potential emergency and should be followed up as soon as possible.  Feel free to call the clinic should you have any questions or concerns. The clinic phone number is [phone].  Please show the [facility] at check-in to the Emergency Department and triage nurse.

## 2020-01-06 ENCOUNTER — Inpatient Hospital Stay: Payer: Medicare Other

## 2020-01-06 ENCOUNTER — Other Ambulatory Visit: Payer: Self-pay

## 2020-01-06 ENCOUNTER — Other Ambulatory Visit: Payer: Self-pay | Admitting: Internal Medicine

## 2020-01-06 VITALS — BP 116/86 | HR 95 | Temp 98.1°F | Resp 18

## 2020-01-06 DIAGNOSIS — C3431 Malignant neoplasm of lower lobe, right bronchus or lung: Secondary | ICD-10-CM | POA: Diagnosis not present

## 2020-01-06 DIAGNOSIS — C3491 Malignant neoplasm of unspecified part of right bronchus or lung: Secondary | ICD-10-CM

## 2020-01-06 DIAGNOSIS — Z9221 Personal history of antineoplastic chemotherapy: Secondary | ICD-10-CM | POA: Diagnosis not present

## 2020-01-06 DIAGNOSIS — Z95828 Presence of other vascular implants and grafts: Secondary | ICD-10-CM

## 2020-01-06 DIAGNOSIS — R590 Localized enlarged lymph nodes: Secondary | ICD-10-CM | POA: Diagnosis not present

## 2020-01-06 DIAGNOSIS — J449 Chronic obstructive pulmonary disease, unspecified: Secondary | ICD-10-CM | POA: Diagnosis not present

## 2020-01-06 DIAGNOSIS — Z923 Personal history of irradiation: Secondary | ICD-10-CM | POA: Diagnosis not present

## 2020-01-06 DIAGNOSIS — Z5112 Encounter for antineoplastic immunotherapy: Secondary | ICD-10-CM | POA: Diagnosis not present

## 2020-01-06 DIAGNOSIS — R5382 Chronic fatigue, unspecified: Secondary | ICD-10-CM

## 2020-01-06 MED ORDER — HEPARIN SOD (PORK) LOCK FLUSH 100 UNIT/ML IV SOLN
500.0000 [IU] | Freq: Once | INTRAVENOUS | Status: AC | PRN
  Administered 2020-01-06: 500 [IU]
  Filled 2020-01-06: qty 5

## 2020-01-06 MED ORDER — SODIUM CHLORIDE 0.9% FLUSH
10.0000 mL | Freq: Once | INTRAVENOUS | Status: DC
  Filled 2020-01-06: qty 10

## 2020-01-06 MED ORDER — SODIUM CHLORIDE 0.9 % IV SOLN
Freq: Once | INTRAVENOUS | Status: AC
  Filled 2020-01-06: qty 250

## 2020-01-06 MED ORDER — SODIUM CHLORIDE 0.9 % IV SOLN
1200.0000 mg | Freq: Once | INTRAVENOUS | Status: AC
  Administered 2020-01-06: 1200 mg via INTRAVENOUS
  Filled 2020-01-06: qty 20

## 2020-01-06 MED ORDER — SODIUM CHLORIDE 0.9% FLUSH
10.0000 mL | INTRAVENOUS | Status: DC | PRN
  Administered 2020-01-06: 10 mL
  Filled 2020-01-06: qty 10

## 2020-01-06 NOTE — Patient Instructions (Signed)
[facility] Discharge Instructions for Patients Receiving Chemotherapy  Today you received the following chemotherapy agents :  Atezolizumab.  To help prevent nausea and vomiting after your treatment, we encourage you to take your nausea medication as prescribed.   If you develop nausea and vomiting that is not controlled by your nausea medication, call the clinic.   BELOW ARE SYMPTOMS THAT SHOULD BE REPORTED IMMEDIATELY:  *FEVER GREATER THAN 100.5 F  *CHILLS WITH OR WITHOUT FEVER  NAUSEA AND VOMITING THAT IS NOT CONTROLLED WITH YOUR NAUSEA MEDICATION  *UNUSUAL SHORTNESS OF BREATH  *UNUSUAL BRUISING OR BLEEDING  TENDERNESS IN MOUTH AND THROAT WITH OR WITHOUT PRESENCE OF ULCERS  *URINARY PROBLEMS  *BOWEL PROBLEMS  UNUSUAL RASH Items with * indicate a potential emergency and should be followed up as soon as possible.  Feel free to call the clinic should you have any questions or concerns. The clinic phone number is [phone].  Please show the [facility] at check-in to the Emergency Department and triage nurse.

## 2020-01-06 NOTE — Progress Notes (Signed)
Pt requested not to get labs drawn today due to just having the drawn and her bill being too hight with too many lab appointments. Per Dr Julien Nordmann ok for patient to skip lab appointment today.

## 2020-01-21 ENCOUNTER — Ambulatory Visit: Payer: Medicaid Other

## 2020-01-21 ENCOUNTER — Other Ambulatory Visit: Payer: Medicaid Other

## 2020-01-21 ENCOUNTER — Ambulatory Visit: Payer: Medicaid Other | Admitting: Physician Assistant

## 2020-01-27 ENCOUNTER — Inpatient Hospital Stay: Payer: Medicare Other

## 2020-01-27 ENCOUNTER — Other Ambulatory Visit: Payer: Self-pay

## 2020-01-27 ENCOUNTER — Encounter: Payer: Self-pay | Admitting: Internal Medicine

## 2020-01-27 ENCOUNTER — Inpatient Hospital Stay (HOSPITAL_BASED_OUTPATIENT_CLINIC_OR_DEPARTMENT_OTHER): Payer: Medicare Other | Admitting: Internal Medicine

## 2020-01-27 ENCOUNTER — Inpatient Hospital Stay: Payer: Medicare Other | Attending: Internal Medicine

## 2020-01-27 VITALS — BP 103/81 | HR 109 | Temp 98.2°F | Resp 20 | Ht 66.0 in | Wt 203.8 lb

## 2020-01-27 VITALS — HR 102

## 2020-01-27 DIAGNOSIS — C3411 Malignant neoplasm of upper lobe, right bronchus or lung: Secondary | ICD-10-CM | POA: Insufficient documentation

## 2020-01-27 DIAGNOSIS — Z923 Personal history of irradiation: Secondary | ICD-10-CM | POA: Diagnosis not present

## 2020-01-27 DIAGNOSIS — J449 Chronic obstructive pulmonary disease, unspecified: Secondary | ICD-10-CM | POA: Diagnosis not present

## 2020-01-27 DIAGNOSIS — R5383 Other fatigue: Secondary | ICD-10-CM | POA: Diagnosis not present

## 2020-01-27 DIAGNOSIS — M1990 Unspecified osteoarthritis, unspecified site: Secondary | ICD-10-CM | POA: Diagnosis not present

## 2020-01-27 DIAGNOSIS — C781 Secondary malignant neoplasm of mediastinum: Secondary | ICD-10-CM | POA: Diagnosis not present

## 2020-01-27 DIAGNOSIS — C3491 Malignant neoplasm of unspecified part of right bronchus or lung: Secondary | ICD-10-CM

## 2020-01-27 DIAGNOSIS — Z791 Long term (current) use of non-steroidal anti-inflammatories (NSAID): Secondary | ICD-10-CM | POA: Diagnosis not present

## 2020-01-27 DIAGNOSIS — Z95828 Presence of other vascular implants and grafts: Secondary | ICD-10-CM

## 2020-01-27 DIAGNOSIS — Z5112 Encounter for antineoplastic immunotherapy: Secondary | ICD-10-CM | POA: Diagnosis not present

## 2020-01-27 DIAGNOSIS — Z9221 Personal history of antineoplastic chemotherapy: Secondary | ICD-10-CM | POA: Insufficient documentation

## 2020-01-27 DIAGNOSIS — R5382 Chronic fatigue, unspecified: Secondary | ICD-10-CM

## 2020-01-27 DIAGNOSIS — Z7951 Long term (current) use of inhaled steroids: Secondary | ICD-10-CM | POA: Diagnosis not present

## 2020-01-27 DIAGNOSIS — Z79899 Other long term (current) drug therapy: Secondary | ICD-10-CM | POA: Insufficient documentation

## 2020-01-27 LAB — CMP (CANCER CENTER ONLY)
ALT: 11 U/L (ref 0–44)
AST: 12 U/L — ABNORMAL LOW (ref 15–41)
Albumin: 3.5 g/dL (ref 3.5–5.0)
Alkaline Phosphatase: 118 U/L (ref 38–126)
Anion gap: 10 (ref 5–15)
BUN: 19 mg/dL (ref 8–23)
CO2: 26 mmol/L (ref 22–32)
Calcium: 9.1 mg/dL (ref 8.9–10.3)
Chloride: 105 mmol/L (ref 98–111)
Creatinine: 1.29 mg/dL — ABNORMAL HIGH (ref 0.44–1.00)
GFR, Est AFR Am: 50 mL/min — ABNORMAL LOW (ref >60)
GFR, Estimated: 43 mL/min — ABNORMAL LOW (ref >60)
Glucose, Bld: 122 mg/dL — ABNORMAL HIGH (ref 70–99)
Potassium: 3.7 mmol/L (ref 3.5–5.1)
Sodium: 141 mmol/L (ref 135–145)
Total Bilirubin: 0.3 mg/dL (ref 0.3–1.2)
Total Protein: 6.8 g/dL (ref 6.5–8.1)

## 2020-01-27 LAB — CBC WITH DIFFERENTIAL (CANCER CENTER ONLY)
Abs Immature Granulocytes: 0.03 10*3/uL (ref 0.00–0.07)
Basophils Absolute: 0.1 10*3/uL (ref 0.0–0.1)
Basophils Relative: 1 %
Eosinophils Absolute: 0.2 10*3/uL (ref 0.0–0.5)
Eosinophils Relative: 3 %
HCT: 42.3 % (ref 36.0–46.0)
Hemoglobin: 14.3 g/dL (ref 12.0–15.0)
Immature Granulocytes: 0 %
Lymphocytes Relative: 12 %
Lymphs Abs: 0.9 10*3/uL (ref 0.7–4.0)
MCH: 30.9 pg (ref 26.0–34.0)
MCHC: 33.8 g/dL (ref 30.0–36.0)
MCV: 91.4 fL (ref 80.0–100.0)
Monocytes Absolute: 0.5 10*3/uL (ref 0.1–1.0)
Monocytes Relative: 7 %
Neutro Abs: 5.9 10*3/uL (ref 1.7–7.7)
Neutrophils Relative %: 77 %
Platelet Count: 270 10*3/uL (ref 150–400)
RBC: 4.63 MIL/uL (ref 3.87–5.11)
RDW: 12.4 % (ref 11.5–15.5)
WBC Count: 7.7 10*3/uL (ref 4.0–10.5)
nRBC: 0 % (ref 0.0–0.2)

## 2020-01-27 LAB — TSH: TSH: 7.676 u[IU]/mL — ABNORMAL HIGH (ref 0.308–3.960)

## 2020-01-27 MED ORDER — SODIUM CHLORIDE 0.9% FLUSH
10.0000 mL | INTRAVENOUS | Status: DC | PRN
  Administered 2020-01-27: 10 mL
  Filled 2020-01-27: qty 10

## 2020-01-27 MED ORDER — SODIUM CHLORIDE 0.9 % IV SOLN
Freq: Once | INTRAVENOUS | Status: AC
  Filled 2020-01-27: qty 250

## 2020-01-27 MED ORDER — SODIUM CHLORIDE 0.9 % IV SOLN
1200.0000 mg | Freq: Once | INTRAVENOUS | Status: AC
  Administered 2020-01-27: 1200 mg via INTRAVENOUS
  Filled 2020-01-27: qty 20

## 2020-01-27 MED ORDER — HEPARIN SOD (PORK) LOCK FLUSH 100 UNIT/ML IV SOLN
500.0000 [IU] | Freq: Once | INTRAVENOUS | Status: AC | PRN
  Administered 2020-01-27: 14:00:00 500 [IU]
  Filled 2020-01-27: qty 5

## 2020-01-27 MED ORDER — SODIUM CHLORIDE 0.9% FLUSH
10.0000 mL | Freq: Once | INTRAVENOUS | Status: AC
  Administered 2020-01-27: 10 mL
  Filled 2020-01-27: qty 10

## 2020-01-27 NOTE — Progress Notes (Signed)
[facility] [phone]   Fax:(336) [place], [place] [address] [place] [address]  DIAGNOSIS: Recurrent small cell lung cancer initially diagnosed as Limited stage (T2b, N2, M0) small cell lung cancer diagnosed in November 2017 and presented with large right hilar mass with mediastinal invasion and a right lower lobe pulmonary nodule.  The patient had disease recurrence in the right lower lobe and mediastinal lymph nodes in February 2020.  PRIOR THERAPY:   1) Systemic chemotherapy with cisplatin 60 MG/M2 on day 1 and etoposide 120 MG/M2 on days 1, 2 and 3 concurrent with radiation status post 6 cycles.  Last dose was giving March 06, 2017. 2) status post prophylactic cranial irradiation.  CURRENT THERAPY: Systemic chemotherapy with carboplatin for AUC of 5 on day 1, etoposide.  100 mg/M2 on days 1, 2 and 3 as well as Tecentriq 1200 mg IV with Neulasta support every 3 weeks.  First dose March 05, 2019.  Status post 11 cycles.  Starting from cycle 5, the patient will be treated with single agent Tecentriq 1200 mg IV every 3 weeks.  First dose May 28, 2019.  INTERVAL HISTORY: Tammy Bowers 69 y.o. female returns to the clinic today for follow-up visit.  The patient is feeling fine today with no concerning complaints except for fatigue.  She is trying to quit and she has a lot of anxiety.  She is trying nicotine patch for quitting.  The patient denied having any chest pain, shortness of breath, cough or hemoptysis.  She denied having any fever or chills.  She has no nausea, vomiting, diarrhea or constipation.  She has no headache or visual changes.  She is here today for evaluation before starting cycle #12 of her treatment.  MEDICAL HISTORY: Past Medical History:  Diagnosis Date  . Adenopathy 10/10/2016   PERICARINAL  . Anemia   . Arthritis   . Asthma   . Cancer ([facility])    lung cancer Small Cell  . COPD  (chronic obstructive pulmonary disease) ([facility])   . Dyspnea   . Headache    migraines  . Hilar mass 10/10/2016   RIGHT  . History of kidney stones   . Insomnia   . Lung nodules 10/10/2016   RIGHT LOWER LOBE  . Mass of both adrenal glands ([facility]) 10/10/2016  . Mood swing   . Pneumonia   . Spinal headache   . Substance abuse ([facility])    TOBACCO  . UNSPECIFIED INFECTION OF BONE ANKLE AND FOOT 10/22/2009   Annotation: left ankle Qualifier: Diagnosis of  By: Patsy Baltimore RN, Denise      ALLERGIES:  is allergic to lidocaine.  MEDICATIONS:  Current Outpatient Medications  Medication Sig Dispense Refill  . albuterol (PROVENTIL HFA;VENTOLIN HFA) 108 (90 BASE) MCG/ACT inhaler Inhale 2 puffs into the lungs every 4 (four) hours as needed for wheezing.    . Cyclobenzaprine HCl (FLEXERIL PO) Take 5 mg by mouth 3 (three) times daily as needed (muscle spasms).     . diphenoxylate-atropine (LOMOTIL) 2.5-0.025 MG tablet Take 1 tablet by mouth 4 (four) times daily as needed for diarrhea or loose stools. 30 tablet 0  . Fluticasone-Salmeterol (ADVAIR) 500-50 MCG/DOSE AEPB Inhale 1 puff into the lungs 2 (two) times daily.    Marland Kitchen ibuprofen (ADVIL) 800 MG tablet Take 800 mg by mouth every 8 (eight) hours as needed.    Marland Kitchen levothyroxine (SYNTHROID) 75 MCG tablet  TAKE 1 TABLET(75 MCG) BY MOUTH DAILY Pt lost her previous prescription. 90 tablet 0  . LORazepam (ATIVAN) 0.5 MG tablet Take 1 tablet (0.5 mg total) by mouth at bedtime. (Patient taking differently: Take 0.5 mg by mouth at bedtime as needed for anxiety. ) 20 tablet 0  . Multiple Vitamin (MULTIVITAMIN WITH MINERALS) TABS tablet Take 1 tablet by mouth daily.     . prochlorperazine (COMPAZINE) 10 MG tablet TAKE 1 TABLET(10 MG) BY MOUTH EVERY 6 HOURS AS NEEDED FOR NAUSEA OR VOMITING 30 tablet 0  . sertraline (ZOLOFT) 50 MG tablet Take 50 mg by mouth at bedtime.  5   No current facility-administered medications for this visit.    SURGICAL HISTORY:  Past Surgical  History:  Procedure Laterality Date  . ANKLE SURGERY     hardware removal, and initial ankle surgery  . APPENDECTOMY    . IR IMAGING GUIDED PORT INSERTION  04/12/2019  . LUNG BIOPSY N/A 10/19/2016   Procedure: LUNG BIOPSY;  Surgeon: Grace Isaac, MD;  Location: [facility];  Service: Thoracic;  Laterality: N/A;  . LUNG BIOPSY N/A 02/20/2019   Procedure: LUNG BIOPSY;  Surgeon: Grace Isaac, MD;  Location: [facility];  Service: Thoracic;  Laterality: N/A;  . TONSILLECTOMY    . VIDEO BRONCHOSCOPY WITH ENDOBRONCHIAL ULTRASOUND N/A 10/19/2016   Procedure: VIDEO BRONCHOSCOPY WITH ENDOBRONCHIAL ULTRASOUND;  Surgeon: Grace Isaac, MD;  Location: [facility];  Service: Thoracic;  Laterality: N/A;  . VIDEO BRONCHOSCOPY WITH ENDOBRONCHIAL ULTRASOUND N/A 02/20/2019   Procedure: VIDEO BRONCHOSCOPY WITH ENDOBRONCHIAL ULTRASOUND;  Surgeon: Grace Isaac, MD;  Location: [facility];  Service: Thoracic;  Laterality: N/A;    REVIEW OF SYSTEMS:  A comprehensive review of systems was negative except for: Constitutional: positive for fatigue   PHYSICAL EXAMINATION: General appearance: alert, cooperative, fatigued and no distress Head: Normocephalic, without obvious abnormality, atraumatic Neck: no adenopathy, no JVD, supple, symmetrical, trachea midline and thyroid not enlarged, symmetric, no tenderness/mass/nodules Lymph nodes: Cervical, supraclavicular, and axillary nodes normal. Resp: clear to auscultation bilaterally Back: symmetric, no curvature. ROM normal. No CVA tenderness. Cardio: regular rate and rhythm, S1, S2 normal, no murmur, click, rub or gallop GI: soft, non-tender; bowel sounds normal; no masses,  no organomegaly Extremities: extremities normal, atraumatic, no cyanosis or edema  ECOG PERFORMANCE STATUS: 1 - Symptomatic but completely ambulatory  Blood pressure 103/81, pulse (!) 109, temperature 98.2 F (36.8 C), temperature source Temporal, resp. rate 20, height 5\' 6"  (1.676 m), weight 203 lb 12.8  oz (92.4 kg), SpO2 97 %.  LABORATORY DATA: Lab Results  Component Value Date   WBC 6.8 12/31/2019   HGB 13.3 12/31/2019   HCT 40.1 12/31/2019   MCV 95.0 12/31/2019   PLT 282 12/31/2019      Chemistry      Component Value Date/Time   NA 141 12/31/2019 1116   NA 139 11/24/2017 0913   K 3.3 (L) 12/31/2019 1116   K 3.8 11/24/2017 0913   CL 106 12/31/2019 1116   CO2 24 12/31/2019 1116   CO2 24 11/24/2017 0913   BUN 18 12/31/2019 1116   BUN 22.1 11/24/2017 0913   CREATININE 1.00 12/31/2019 1116   CREATININE 1.2 (H) 11/24/2017 0913      Component Value Date/Time   CALCIUM 8.6 (L) 12/31/2019 1116   CALCIUM 9.7 11/24/2017 0913   ALKPHOS 113 12/31/2019 1116   ALKPHOS 99 11/24/2017 0913   AST 13 (L) 12/31/2019 1116   AST 15 11/24/2017 0913  ALT 9 12/31/2019 1116   ALT 7 11/24/2017 0913   BILITOT 0.3 12/31/2019 1116   BILITOT 0.37 11/24/2017 0913       RADIOGRAPHIC STUDIES: No results found.  ASSESSMENT AND PLAN:  This is a very pleasant 69 years old white female with recurrent small cell lung cancer that was initially diagnosed as limited stage disease status post a course of systemic chemotherapy with cisplatin and etoposide for 6 cycles concurrent with radiation. She tolerated her treatment well except for fatigue. She is also status post prophylactic cranial irradiation. The patient has been in observation since March 2018. She was found on recent imaging studies to have evidence for disease recurrence with right lower lobe pulmonary nodules in addition to mediastinal lymphadenopathy. Repeat bronchoscopy with endobronchial ultrasound and biopsy confirmed recurrence of his small cell lung cancer. The patient was started on systemic chemotherapy with carboplatin, etoposide and Tecentriq status post 11 cycles.  She has partial response after cycle #4 and starting from cycle #5 the patient is currently on maintenance treatment with Tecentriq.  The patient continues to  tolerate this treatment well with no concerning adverse effect except for mild fatigue. I recommended for her to proceed with cycle #12 today as planned. I will see her back for follow-up visit in 3 weeks for evaluation with repeat CT scan of the chest, abdomen pelvis for restaging of her disease. For the smoking cessation, I strongly encouraged the patient to quit smoking and she will try the nicotine patch for now. She was advised to call immediately if she has any concerning symptoms in the interval. The patient voices understanding of current disease status and treatment options and is in agreement with the current care plan.  All questions were answered. The patient knows to call the clinic with any problems, questions or concerns. We can certainly see the patient much sooner if necessary.  Disclaimer: This note was dictated with voice recognition software. Similar sounding words can inadvertently be transcribed and may not be corrected upon review.

## 2020-01-27 NOTE — Patient Instructions (Signed)
Steps to Quit Smoking Smoking tobacco is the leading cause of preventable death. It can affect almost every organ in the body. Smoking puts you and people around you at risk for many serious, long-lasting (chronic) diseases. Quitting smoking can be hard, but it is one of the best things that you can do for your health. It is never too late to quit. How do I get ready to quit? When you decide to quit smoking, make a plan to help you succeed. Before you quit:  Pick a date to quit. Set a date within the next 2 weeks to give you time to prepare.  Write down the reasons why you are quitting. Keep this list in places where you will see it often.  Tell your family, friends, and co-workers that you are quitting. Their support is important.  Talk with your doctor about the choices that may help you quit.  Find out if your health insurance will pay for these treatments.  Know the people, places, things, and activities that make you want to smoke (triggers). Avoid them. What first steps can I take to quit smoking?  Throw away all cigarettes at home, at work, and in your car.  Throw away the things that you use when you smoke, such as ashtrays and lighters.  Clean your car. Make sure to empty the ashtray.  Clean your home, including curtains and carpets. What can I do to help me quit smoking? Talk with your doctor about taking medicines and seeing a counselor at the same time. You are more likely to succeed when you do both.  If you are pregnant or breastfeeding, talk with your doctor about counseling or other ways to quit smoking. Do not take medicine to help you quit smoking unless your doctor tells you to do so. To quit smoking: Quit right away  Quit smoking totally, instead of slowly cutting back on how much you smoke over a period of time.  Go to counseling. You are more likely to quit if you go to counseling sessions regularly. Take medicine You may take medicines to help you quit. Some  medicines need a prescription, and some you can buy over-the-counter. Some medicines may contain a drug called nicotine to replace the nicotine in cigarettes. Medicines may:  Help you to stop having the desire to smoke (cravings).  Help to stop the problems that come when you stop smoking (withdrawal symptoms). Your doctor may ask you to use:  Nicotine patches, gum, or lozenges.  Nicotine inhalers or sprays.  Non-nicotine medicine that is taken by mouth. Find resources Find resources and other ways to help you quit smoking and remain smoke-free after you quit. These resources are most helpful when you use them often. They include:  Online chats with a counselor.  Phone quitlines.  Printed self-help materials.  Support groups or group counseling.  Text messaging programs.  Mobile phone apps. Use apps on your mobile phone or tablet that can help you stick to your quit plan. There are many free apps for mobile phones and tablets as well as websites. Examples include Quit Guide from the CDC and smokefree.gov  What things can I do to make it easier to quit?   Talk to your family and friends. Ask them to support and encourage you.  Call a phone quitline (1-800-QUIT-NOW), reach out to support groups, or work with a counselor.  Ask people who smoke to not smoke around you.  Avoid places that make you want to smoke,   such as: ? Bars. ? Parties. ? Smoke-break areas at work.  Spend time with people who do not smoke.  Lower the stress in your life. Stress can make you want to smoke. Try these things to help your stress: ? Getting regular exercise. ? Doing deep-breathing exercises. ? Doing yoga. ? Meditating. ? Doing a body scan. To do this, close your eyes, focus on one area of your body at a time from head to toe. Notice which parts of your body are tense. Try to relax the muscles in those areas. How will I feel when I quit smoking? Day 1 to 3 weeks Within the first 24 hours,  you may start to have some problems that come from quitting tobacco. These problems are very bad 2-3 days after you quit, but they do not often last for more than 2-3 weeks. You may get these symptoms:  Mood swings.  Feeling restless, nervous, angry, or annoyed.  Trouble concentrating.  Dizziness.  Strong desire for high-sugar foods and nicotine.  Weight gain.  Trouble pooping (constipation).  Feeling like you may vomit (nausea).  Coughing or a sore throat.  Changes in how the medicines that you take for other issues work in your body.  Depression.  Trouble sleeping (insomnia). Week 3 and afterward After the first 2-3 weeks of quitting, you may start to notice more positive results, such as:  Better sense of smell and taste.  Less coughing and sore throat.  Slower heart rate.  Lower blood pressure.  Clearer skin.  Better breathing.  Fewer sick days. Quitting smoking can be hard. Do not give up if you fail the first time. Some people need to try a few times before they succeed. Do your best to stick to your quit plan, and talk with your doctor if you have any questions or concerns. Summary  Smoking tobacco is the leading cause of preventable death. Quitting smoking can be hard, but it is one of the best things that you can do for your health.  When you decide to quit smoking, make a plan to help you succeed.  Quit smoking right away, not slowly over a period of time.  When you start quitting, seek help from your doctor, family, or friends. This information is not intended to replace advice given to you by your health care provider. Make sure you discuss any questions you have with your health care provider. Document Revised: 08/30/2019 Document Reviewed: 02/23/2019 Elsevier Patient Education  2020 Elsevier Inc.  

## 2020-01-27 NOTE — Patient Instructions (Signed)
[facility] Discharge Instructions for Patients Receiving Chemotherapy  Today you received the following Immunotherapy agent: Atezolizomab (Tencentriq)  To help prevent nausea and vomiting after your treatment, we encourage you to take your nausea medication as directed by your MD.   If you develop nausea and vomiting that is not controlled by your nausea medication, call the clinic.   BELOW ARE SYMPTOMS THAT SHOULD BE REPORTED IMMEDIATELY:  *FEVER GREATER THAN 100.5 F  *CHILLS WITH OR WITHOUT FEVER  NAUSEA AND VOMITING THAT IS NOT CONTROLLED WITH YOUR NAUSEA MEDICATION  *UNUSUAL SHORTNESS OF BREATH  *UNUSUAL BRUISING OR BLEEDING  TENDERNESS IN MOUTH AND THROAT WITH OR WITHOUT PRESENCE OF ULCERS  *URINARY PROBLEMS  *BOWEL PROBLEMS  UNUSUAL RASH Items with * indicate a potential emergency and should be followed up as soon as possible.  Feel free to call the clinic should you have any questions or concerns. The clinic phone number is [phone].  Please show the [facility] at check-in to the Emergency Department and triage nurse.  Coronavirus (COVID-19) Are you at risk?  Are you at risk for the Coronavirus (COVID-19)?  To be considered HIGH RISK for Coronavirus (COVID-19), you have to meet the following criteria:  . Traveled to [place], [place], [place], [place] or [place]; or in the [place] to [place], [place], [place], or [place]; and have fever, cough, and shortness of breath within the last 2 weeks of travel OR . Been in close contact with a person diagnosed with COVID-19 within the last 2 weeks and have fever, cough, and shortness of breath . IF YOU DO NOT MEET THESE CRITERIA, YOU ARE CONSIDERED LOW RISK FOR COVID-19.  What to do if you are HIGH RISK for COVID-19?  Marland Kitchen If you are having a medical emergency, call 911. . Seek medical care right away. Before you go to a doctor's office, urgent care or emergency department, call ahead  and tell them about your recent travel, contact with someone diagnosed with COVID-19, and your symptoms. You should receive instructions from your physician's office regarding next steps of care.  . When you arrive at healthcare provider, tell the healthcare staff immediately you have returned from visiting [place], [place], [place], [place] or [place]; or traveled in the [place] to [place], [place], [place], or [place]; in the last two weeks or you have been in close contact with a person diagnosed with COVID-19 in the last 2 weeks.   . Tell the health care staff about your symptoms: fever, cough and shortness of breath. . After you have been seen by a medical provider, you will be either: o Tested for (COVID-19) and discharged home on quarantine except to seek medical care if symptoms worsen, and asked to  - Stay home and avoid contact with others until you get your results (4-5 days)  - Avoid travel on public transportation if possible (such as bus, train, or airplane) or o Sent to the Emergency Department by EMS for evaluation, COVID-19 testing, and possible admission depending on your condition and test results.  What to do if you are LOW RISK for COVID-19?  Reduce your risk of any infection by using the same precautions used for avoiding the common cold or flu:  Marland Kitchen Wash your hands often with soap and warm water for at least 20 seconds.  If soap and water are not readily available, use an alcohol-based hand sanitizer with at least 60% alcohol.  Marland Kitchen  If coughing or sneezing, cover your mouth and nose by coughing or sneezing into the elbow areas of your shirt or coat, into a tissue or into your sleeve (not your hands). . Avoid shaking hands with others and consider head nods or verbal greetings only. . Avoid touching your eyes, nose, or mouth with unwashed hands.  . Avoid close contact with people who are sick. . Avoid places or events with large numbers of people in one location, like  concerts or sporting events. . Carefully consider travel plans you have or are making. . If you are planning any travel outside or inside the [place], visit the CDC's Travelers' Health webpage for the latest health notices. . If you have some symptoms but not all symptoms, continue to monitor at home and seek medical attention if your symptoms worsen. . If you are having a medical emergency, call 911.   [facility] / e-Visit: [email or web address]         [facility]: [facility]: [phone]                   [facility]: [phone]

## 2020-02-14 ENCOUNTER — Ambulatory Visit (HOSPITAL_COMMUNITY)
Admission: RE | Admit: 2020-02-14 | Discharge: 2020-02-14 | Disposition: A | Discharge status: Home / Self Care | Payer: Medicare Other | Source: Ambulatory Visit | Attending: Internal Medicine | Admitting: Internal Medicine

## 2020-02-14 ENCOUNTER — Other Ambulatory Visit: Payer: Self-pay

## 2020-02-14 DIAGNOSIS — C3491 Malignant neoplasm of unspecified part of right bronchus or lung: Secondary | ICD-10-CM

## 2020-02-14 DIAGNOSIS — C3490 Malignant neoplasm of unspecified part of unspecified bronchus or lung: Secondary | ICD-10-CM | POA: Diagnosis not present

## 2020-02-14 DIAGNOSIS — D3501 Benign neoplasm of right adrenal gland: Secondary | ICD-10-CM | POA: Diagnosis not present

## 2020-02-14 MED ORDER — SODIUM CHLORIDE (PF) 0.9 % IJ SOLN
INTRAMUSCULAR | Status: AC
  Filled 2020-02-14: qty 50

## 2020-02-14 MED ORDER — HEPARIN SOD (PORK) LOCK FLUSH 100 UNIT/ML IV SOLN
INTRAVENOUS | Status: AC
  Filled 2020-02-14: qty 5

## 2020-02-14 MED ORDER — IOHEXOL 300 MG/ML  SOLN
100.0000 mL | Freq: Once | INTRAMUSCULAR | Status: AC | PRN
  Administered 2020-02-14: 16:00:00 100 mL via INTRAVENOUS

## 2020-02-14 MED ORDER — HEPARIN SOD (PORK) LOCK FLUSH 100 UNIT/ML IV SOLN
500.0000 [IU] | Freq: Once | INTRAVENOUS | Status: AC
  Administered 2020-02-14: 16:00:00 500 [IU] via INTRAVENOUS

## 2020-02-17 ENCOUNTER — Encounter: Payer: Self-pay | Admitting: Internal Medicine

## 2020-02-17 ENCOUNTER — Inpatient Hospital Stay (HOSPITAL_BASED_OUTPATIENT_CLINIC_OR_DEPARTMENT_OTHER): Payer: Medicare Other | Admitting: Internal Medicine

## 2020-02-17 ENCOUNTER — Inpatient Hospital Stay: Payer: Medicare Other

## 2020-02-17 ENCOUNTER — Inpatient Hospital Stay: Payer: Medicare Other | Attending: Internal Medicine

## 2020-02-17 ENCOUNTER — Other Ambulatory Visit: Payer: Self-pay

## 2020-02-17 VITALS — BP 140/102 | HR 100 | Temp 98.1°F | Resp 18 | Ht 66.0 in

## 2020-02-17 VITALS — BP 114/79 | HR 91

## 2020-02-17 DIAGNOSIS — Z791 Long term (current) use of non-steroidal anti-inflammatories (NSAID): Secondary | ICD-10-CM | POA: Insufficient documentation

## 2020-02-17 DIAGNOSIS — M1990 Unspecified osteoarthritis, unspecified site: Secondary | ICD-10-CM | POA: Insufficient documentation

## 2020-02-17 DIAGNOSIS — Z5112 Encounter for antineoplastic immunotherapy: Secondary | ICD-10-CM | POA: Diagnosis not present

## 2020-02-17 DIAGNOSIS — Z79899 Other long term (current) drug therapy: Secondary | ICD-10-CM | POA: Insufficient documentation

## 2020-02-17 DIAGNOSIS — R5383 Other fatigue: Secondary | ICD-10-CM | POA: Insufficient documentation

## 2020-02-17 DIAGNOSIS — C7801 Secondary malignant neoplasm of right lung: Secondary | ICD-10-CM | POA: Insufficient documentation

## 2020-02-17 DIAGNOSIS — F17210 Nicotine dependence, cigarettes, uncomplicated: Secondary | ICD-10-CM | POA: Diagnosis not present

## 2020-02-17 DIAGNOSIS — C3491 Malignant neoplasm of unspecified part of right bronchus or lung: Secondary | ICD-10-CM

## 2020-02-17 DIAGNOSIS — J449 Chronic obstructive pulmonary disease, unspecified: Secondary | ICD-10-CM | POA: Diagnosis not present

## 2020-02-17 DIAGNOSIS — Z7951 Long term (current) use of inhaled steroids: Secondary | ICD-10-CM | POA: Diagnosis not present

## 2020-02-17 DIAGNOSIS — Z923 Personal history of irradiation: Secondary | ICD-10-CM | POA: Insufficient documentation

## 2020-02-17 DIAGNOSIS — Z9221 Personal history of antineoplastic chemotherapy: Secondary | ICD-10-CM | POA: Insufficient documentation

## 2020-02-17 DIAGNOSIS — C3401 Malignant neoplasm of right main bronchus: Secondary | ICD-10-CM | POA: Insufficient documentation

## 2020-02-17 DIAGNOSIS — R110 Nausea: Secondary | ICD-10-CM | POA: Diagnosis not present

## 2020-02-17 DIAGNOSIS — C771 Secondary and unspecified malignant neoplasm of intrathoracic lymph nodes: Secondary | ICD-10-CM | POA: Insufficient documentation

## 2020-02-17 DIAGNOSIS — R5382 Chronic fatigue, unspecified: Secondary | ICD-10-CM

## 2020-02-17 LAB — CMP (CANCER CENTER ONLY)
ALT: 7 U/L (ref 0–44)
AST: 13 U/L — ABNORMAL LOW (ref 15–41)
Albumin: 3.3 g/dL — ABNORMAL LOW (ref 3.5–5.0)
Alkaline Phosphatase: 105 U/L (ref 38–126)
Anion gap: 8 (ref 5–15)
BUN: 19 mg/dL (ref 8–23)
CO2: 27 mmol/L (ref 22–32)
Calcium: 8.8 mg/dL — ABNORMAL LOW (ref 8.9–10.3)
Chloride: 107 mmol/L (ref 98–111)
Creatinine: 1.09 mg/dL — ABNORMAL HIGH (ref 0.44–1.00)
GFR, Est AFR Am: >60 mL/min (ref >60)
GFR, Estimated: 53 mL/min — ABNORMAL LOW (ref >60)
Glucose, Bld: 88 mg/dL (ref 70–99)
Potassium: 3.7 mmol/L (ref 3.5–5.1)
Sodium: 142 mmol/L (ref 135–145)
Total Bilirubin: 0.3 mg/dL (ref 0.3–1.2)
Total Protein: 6.5 g/dL (ref 6.5–8.1)

## 2020-02-17 LAB — CBC WITH DIFFERENTIAL (CANCER CENTER ONLY)
Abs Immature Granulocytes: 0.01 10*3/uL (ref 0.00–0.07)
Basophils Absolute: 0.1 10*3/uL (ref 0.0–0.1)
Basophils Relative: 1 %
Eosinophils Absolute: 0.2 10*3/uL (ref 0.0–0.5)
Eosinophils Relative: 3 %
HCT: 39.3 % (ref 36.0–46.0)
Hemoglobin: 13.1 g/dL (ref 12.0–15.0)
Immature Granulocytes: 0 %
Lymphocytes Relative: 13 %
Lymphs Abs: 0.9 10*3/uL (ref 0.7–4.0)
MCH: 30.5 pg (ref 26.0–34.0)
MCHC: 33.3 g/dL (ref 30.0–36.0)
MCV: 91.4 fL (ref 80.0–100.0)
Monocytes Absolute: 0.5 10*3/uL (ref 0.1–1.0)
Monocytes Relative: 7 %
Neutro Abs: 4.9 10*3/uL (ref 1.7–7.7)
Neutrophils Relative %: 76 %
Platelet Count: 269 10*3/uL (ref 150–400)
RBC: 4.3 MIL/uL (ref 3.87–5.11)
RDW: 12.3 % (ref 11.5–15.5)
WBC Count: 6.5 10*3/uL (ref 4.0–10.5)
nRBC: 0 % (ref 0.0–0.2)

## 2020-02-17 LAB — TSH: TSH: 4.567 u[IU]/mL — ABNORMAL HIGH (ref 0.308–3.960)

## 2020-02-17 MED ORDER — SODIUM CHLORIDE 0.9 % IV SOLN
1200.0000 mg | Freq: Once | INTRAVENOUS | Status: AC
  Administered 2020-02-17: 13:00:00 1200 mg via INTRAVENOUS
  Filled 2020-02-17: qty 20

## 2020-02-17 MED ORDER — SODIUM CHLORIDE 0.9 % IV SOLN
Freq: Once | INTRAVENOUS | Status: AC
  Filled 2020-02-17: qty 250

## 2020-02-17 MED ORDER — HEPARIN SOD (PORK) LOCK FLUSH 100 UNIT/ML IV SOLN
500.0000 [IU] | Freq: Once | INTRAVENOUS | Status: AC | PRN
  Administered 2020-02-17: 14:00:00 500 [IU]
  Filled 2020-02-17: qty 5

## 2020-02-17 MED ORDER — SODIUM CHLORIDE 0.9% FLUSH
10.0000 mL | INTRAVENOUS | Status: DC | PRN
  Administered 2020-02-17: 10 mL
  Filled 2020-02-17: qty 10

## 2020-02-17 NOTE — Patient Instructions (Signed)
[facility] Discharge Instructions for Patients Receiving Chemotherapy  Today you received the following chemotherapy agents: tecentrique  To help prevent nausea and vomiting after your treatment, we encourage you to take your nausea medication as directed.   If you develop nausea and vomiting that is not controlled by your nausea medication, call the clinic.   BELOW ARE SYMPTOMS THAT SHOULD BE REPORTED IMMEDIATELY:  *FEVER GREATER THAN 100.5 F  *CHILLS WITH OR WITHOUT FEVER  NAUSEA AND VOMITING THAT IS NOT CONTROLLED WITH YOUR NAUSEA MEDICATION  *UNUSUAL SHORTNESS OF BREATH  *UNUSUAL BRUISING OR BLEEDING  TENDERNESS IN MOUTH AND THROAT WITH OR WITHOUT PRESENCE OF ULCERS  *URINARY PROBLEMS  *BOWEL PROBLEMS  UNUSUAL RASH Items with * indicate a potential emergency and should be followed up as soon as possible.  Feel free to call the clinic should you have any questions or concerns. The clinic phone number is [phone].  Please show the [facility] at check-in to the Emergency Department and triage nurse.

## 2020-02-17 NOTE — Progress Notes (Signed)
[place] [phone]   Fax:(336) [place], [place] [address] [place] [address]  DIAGNOSIS: Recurrent small cell lung cancer initially diagnosed as Limited stage (T2b, N2, M0) small cell lung cancer diagnosed in November 2017 and presented with large right hilar mass with mediastinal invasion and a right lower lobe pulmonary nodule.  The patient had disease recurrence in the right lower lobe and mediastinal lymph nodes in February 2020.  PRIOR THERAPY:   1) Systemic chemotherapy with cisplatin 60 MG/M2 on day 1 and etoposide 120 MG/M2 on days 1, 2 and 3 concurrent with radiation status post 6 cycles.  Last dose was giving March 06, 2017. 2) status post prophylactic cranial irradiation.  CURRENT THERAPY: Systemic chemotherapy with carboplatin for AUC of 5 on day 1, etoposide.  100 mg/M2 on days 1, 2 and 3 as well as Tecentriq 1200 mg IV with Neulasta support every 3 weeks.  First dose March 05, 2019.  Status post 12 cycles.  Starting from cycle 5, the patient will be treated with single agent Tecentriq 1200 mg IV every 3 weeks.  First dose May 28, 2019.  INTERVAL HISTORY: Tammy Bowers 69 y.o. female returns to the clinic today for follow-up visit.  The patient is feeling fine today with no concerning complaints except for mild fatigue and intermittent nausea.  She denied having any current chest pain, shortness of breath, cough or hemoptysis.  She denied having any fever or chills.  She has no vomiting, diarrhea or constipation.  She has no abdominal pain.  The patient has no significant weight loss or night sweats.  She has been tolerating her treatment with immunotherapy fairly well.  She had repeat CT scan of the chest, abdomen pelvis performed recently and she is here for evaluation and discussion of her scan results.  MEDICAL HISTORY: Past Medical History:  Diagnosis Date   Adenopathy 10/10/2016     PERICARINAL   Anemia    Arthritis    Asthma    Cancer ([place])    lung cancer Small Cell   COPD (chronic obstructive pulmonary disease) ([place])    Dyspnea    Headache    migraines   Hilar mass 10/10/2016   RIGHT   History of kidney stones    Insomnia    Lung nodules 10/10/2016   RIGHT LOWER LOBE   Mass of both adrenal glands ([place]) 10/10/2016   Mood swing    Pneumonia    Spinal headache    Substance abuse ([place])    TOBACCO   UNSPECIFIED INFECTION OF BONE ANKLE AND FOOT 10/22/2009   Annotation: left ankle Qualifier: Diagnosis of  By: Patsy [place] RN, Denise      ALLERGIES:  is allergic to lidocaine.  MEDICATIONS:  Current Outpatient Medications  Medication Sig Dispense Refill   albuterol (PROVENTIL HFA;VENTOLIN HFA) 108 (90 BASE) MCG/ACT inhaler Inhale 2 puffs into the lungs every 4 (four) hours as needed for wheezing.     Cyclobenzaprine HCl (FLEXERIL PO) Take 5 mg by mouth 3 (three) times daily as needed (muscle spasms).      diphenoxylate-atropine (LOMOTIL) 2.5-0.025 MG tablet Take 1 tablet by mouth 4 (four) times daily as needed for diarrhea or loose stools. (Patient not taking: Reported on 01/27/2020) 30 tablet 0   Fluticasone-Salmeterol (ADVAIR) 500-50 MCG/DOSE AEPB Inhale 1 puff into the lungs 2 (two) times daily.     ibuprofen (ADVIL) 800 MG tablet  Take 800 mg by mouth every 8 (eight) hours as needed.     levothyroxine (SYNTHROID) 75 MCG tablet TAKE 1 TABLET(75 MCG) BY MOUTH DAILY Pt lost her previous prescription. 90 tablet 0   LORazepam (ATIVAN) 0.5 MG tablet Take 1 tablet (0.5 mg total) by mouth at bedtime. (Patient not taking: Reported on 01/27/2020) 20 tablet 0   Multiple Vitamin (MULTIVITAMIN WITH MINERALS) TABS tablet Take 1 tablet by mouth daily.      prochlorperazine (COMPAZINE) 10 MG tablet TAKE 1 TABLET(10 MG) BY MOUTH EVERY 6 HOURS AS NEEDED FOR NAUSEA OR VOMITING (Patient not taking: Reported on 01/27/2020) 30 tablet 0   sertraline (ZOLOFT) 50  MG tablet Take 50 mg by mouth at bedtime.  5   No current facility-administered medications for this visit.    SURGICAL HISTORY:  Past Surgical History:  Procedure Laterality Date   ANKLE SURGERY     hardware removal, and initial ankle surgery   APPENDECTOMY     IR IMAGING GUIDED PORT INSERTION  04/12/2019   LUNG BIOPSY N/A 10/19/2016   Procedure: LUNG BIOPSY;  Surgeon: Grace Isaac, MD;  Location: [facility];  Service: Thoracic;  Laterality: N/A;   LUNG BIOPSY N/A 02/20/2019   Procedure: LUNG BIOPSY;  Surgeon: Grace Isaac, MD;  Location: [facility];  Service: Thoracic;  Laterality: N/A;   TONSILLECTOMY     VIDEO BRONCHOSCOPY WITH ENDOBRONCHIAL ULTRASOUND N/A 10/19/2016   Procedure: VIDEO BRONCHOSCOPY WITH ENDOBRONCHIAL ULTRASOUND;  Surgeon: Grace Isaac, MD;  Location: [facility];  Service: Thoracic;  Laterality: N/A;   VIDEO BRONCHOSCOPY WITH ENDOBRONCHIAL ULTRASOUND N/A 02/20/2019   Procedure: VIDEO BRONCHOSCOPY WITH ENDOBRONCHIAL ULTRASOUND;  Surgeon: Grace Isaac, MD;  Location: [facility];  Service: Thoracic;  Laterality: N/A;    REVIEW OF SYSTEMS:  Constitutional: positive for fatigue Eyes: negative Ears, nose, mouth, throat, and face: negative Respiratory: negative Cardiovascular: negative Gastrointestinal: positive for nausea Genitourinary:negative Integument/breast: negative Hematologic/lymphatic: negative Musculoskeletal:negative Neurological: negative Behavioral/Psych: negative Endocrine: negative Allergic/Immunologic: negative   PHYSICAL EXAMINATION: General appearance: alert, cooperative, fatigued and no distress Head: Normocephalic, without obvious abnormality, atraumatic Neck: no adenopathy, no JVD, supple, symmetrical, trachea midline and thyroid not enlarged, symmetric, no tenderness/mass/nodules Lymph nodes: Cervical, supraclavicular, and axillary nodes normal. Resp: clear to auscultation bilaterally Back: symmetric, no curvature. ROM normal. No CVA  tenderness. Cardio: regular rate and rhythm, S1, S2 normal, no murmur, click, rub or gallop GI: soft, non-tender; bowel sounds normal; no masses,  no organomegaly Extremities: extremities normal, atraumatic, no cyanosis or edema Neurologic: Alert and oriented X 3, normal strength and tone. Normal symmetric reflexes. Normal coordination and gait  ECOG PERFORMANCE STATUS: 1 - Symptomatic but completely ambulatory  Blood pressure (!) 140/102, pulse 100, temperature 98.1 F (36.7 C), temperature source Temporal, resp. rate 18, height 5\' 6"  (1.676 m), SpO2 97 %.  LABORATORY DATA: Lab Results  Component Value Date   WBC 7.7 01/27/2020   HGB 14.3 01/27/2020   HCT 42.3 01/27/2020   MCV 91.4 01/27/2020   PLT 270 01/27/2020      Chemistry      Component Value Date/Time   NA 141 01/27/2020 1115   NA 139 11/24/2017 0913   K 3.7 01/27/2020 1115   K 3.8 11/24/2017 0913   CL 105 01/27/2020 1115   CO2 26 01/27/2020 1115   CO2 24 11/24/2017 0913   BUN 19 01/27/2020 1115   BUN 22.1 11/24/2017 0913   CREATININE 1.29 (H) 01/27/2020 1115   CREATININE 1.2 (  H) 11/24/2017 0913      Component Value Date/Time   CALCIUM 9.1 01/27/2020 1115   CALCIUM 9.7 11/24/2017 0913   ALKPHOS 118 01/27/2020 1115   ALKPHOS 99 11/24/2017 0913   AST 12 (L) 01/27/2020 1115   AST 15 11/24/2017 0913   ALT 11 01/27/2020 1115   ALT 7 11/24/2017 0913   BILITOT 0.3 01/27/2020 1115   BILITOT 0.37 11/24/2017 0913       RADIOGRAPHIC STUDIES: CT Chest W Contrast  Result Date: 02/14/2020 CLINICAL DATA:  Small cell lung cancer, assess treatment response initially diagnosed with limited stage small cell lung cancer in 2017, found to have recurrence in February of 2020 in the right lower lobe and in mediastinal nodes. Currently undergoing systemic therapy. EXAM: CT CHEST, ABDOMEN, AND PELVIS WITH CONTRAST TECHNIQUE: Multidetector CT imaging of the chest, abdomen and pelvis was performed following the standard protocol  during bolus administration of intravenous contrast. CONTRAST:  1101mL OMNIPAQUE IOHEXOL 300 MG/ML  SOLN COMPARISON:  12/09/2019 FINDINGS: CT CHEST FINDINGS Cardiovascular: Right IJ Port-A-Cath terminates in the distal superior vena cava. Calcified atherosclerotic changes of the thoracic aorta are similar to the prior study. Heart size is stable without pericardial effusion. Distortion of right hilum secondary to post treatment changes. Central pulmonary vasculature otherwise unremarkable with limited assessment. Mediastinum/Nodes: Right perihilar post treatment changes. Partially calcified subcarinal lymph node measures 10 mm short axis and is unchanged. No signs of thoracic inlet or axillary lymphadenopathy. Lungs/Pleura: Right perihilar fibrosis measuring 3.4 x 2.9 cm, previously approximately 3.4 x 2.9 cm. Calcified granuloma in the right middle lobe. New small pulmonary nodule in the right lower lobe (image 65, series 4) 4 mm. Subtle area of nodularity just above the major fissure in the right chest in the right upper lobe (image 50, series 4) 3 mm. There was motion artifact in this area on the previous study. It is not clear whether the above very faint nodule was present. No signs of consolidation or evidence of pleural effusion. Mild bronchial wall thickening throughout with similar appearance. Musculoskeletal: No signs of chest wall mass. See below for further information regarding musculoskeletal findings. CT ABDOMEN PELVIS FINDINGS Hepatobiliary: No signs of focal, suspicious hepatic lesion. The portal vein is patent. Pancreas: Pancreas is normal without ductal dilation or peripancreatic inflammation. Spleen: Spleen is normal size. Signs of granulomatous disease in the spleen. Adrenals/Urinary Tract: Well-circumscribed right adrenal nodule measures 3.4 x 2.8 cm unchanged since 2017. Left adrenal is normal. Tiny hypodensity in the upper pole of the left kidney is unchanged. No signs of hydronephrosis. No  visible calculi. Normal appearance of the urinary bladder. Stomach/Bowel: No signs of acute gastrointestinal process. Mild distension of proximal small bowel loops with gradual transition is presumably related to ingestion of enteric contrast media. Stool throughout the colon. Appendix not visualized though there are no pericecal inflammatory changes. Vascular/Lymphatic: Scattered atherosclerosis throughout the abdominal aorta. No signs of adenopathy in the retroperitoneum or upper abdomen. Duplicated inferior vena cava with small left-sided inferior vena cava. No signs of retroperitoneal lymphadenopathy. Reproductive: Normal CT appearance of uterus and adnexa. Other: No abdominal wall hernia or abnormality. No abdominopelvic ascites. Musculoskeletal: No signs of acute bone finding or destructive bone process. IMPRESSION: 1. Post treatment changes in the right chest similar to prior study. 2. New 4 mm pulmonary nodule in the right lower lobe. This small nodule and another ill-defined right upper lobe nodule are suspicious given patient history for early signs of additional disease. Short interval follow-up  may be warranted given small size the largest lesion. 3. Stable large right adrenal adenoma. Aortic Atherosclerosis (ICD10-I70.0). Electronically Signed   By: Zetta Bills M.D.   On: 02/14/2020 18:04   CT Abdomen Pelvis W Contrast  Result Date: 02/14/2020 CLINICAL DATA:  Small cell lung cancer, assess treatment response initially diagnosed with limited stage small cell lung cancer in 2017, found to have recurrence in February of 2020 in the right lower lobe and in mediastinal nodes. Currently undergoing systemic therapy. EXAM: CT CHEST, ABDOMEN, AND PELVIS WITH CONTRAST TECHNIQUE: Multidetector CT imaging of the chest, abdomen and pelvis was performed following the standard protocol during bolus administration of intravenous contrast. CONTRAST:  16mL OMNIPAQUE IOHEXOL 300 MG/ML  SOLN COMPARISON:  12/09/2019  FINDINGS: CT CHEST FINDINGS Cardiovascular: Right IJ Port-A-Cath terminates in the distal superior vena cava. Calcified atherosclerotic changes of the thoracic aorta are similar to the prior study. Heart size is stable without pericardial effusion. Distortion of right hilum secondary to post treatment changes. Central pulmonary vasculature otherwise unremarkable with limited assessment. Mediastinum/Nodes: Right perihilar post treatment changes. Partially calcified subcarinal lymph node measures 10 mm short axis and is unchanged. No signs of thoracic inlet or axillary lymphadenopathy. Lungs/Pleura: Right perihilar fibrosis measuring 3.4 x 2.9 cm, previously approximately 3.4 x 2.9 cm. Calcified granuloma in the right middle lobe. New small pulmonary nodule in the right lower lobe (image 65, series 4) 4 mm. Subtle area of nodularity just above the major fissure in the right chest in the right upper lobe (image 50, series 4) 3 mm. There was motion artifact in this area on the previous study. It is not clear whether the above very faint nodule was present. No signs of consolidation or evidence of pleural effusion. Mild bronchial wall thickening throughout with similar appearance. Musculoskeletal: No signs of chest wall mass. See below for further information regarding musculoskeletal findings. CT ABDOMEN PELVIS FINDINGS Hepatobiliary: No signs of focal, suspicious hepatic lesion. The portal vein is patent. Pancreas: Pancreas is normal without ductal dilation or peripancreatic inflammation. Spleen: Spleen is normal size. Signs of granulomatous disease in the spleen. Adrenals/Urinary Tract: Well-circumscribed right adrenal nodule measures 3.4 x 2.8 cm unchanged since 2017. Left adrenal is normal. Tiny hypodensity in the upper pole of the left kidney is unchanged. No signs of hydronephrosis. No visible calculi. Normal appearance of the urinary bladder. Stomach/Bowel: No signs of acute gastrointestinal process. Mild  distension of proximal small bowel loops with gradual transition is presumably related to ingestion of enteric contrast media. Stool throughout the colon. Appendix not visualized though there are no pericecal inflammatory changes. Vascular/Lymphatic: Scattered atherosclerosis throughout the abdominal aorta. No signs of adenopathy in the retroperitoneum or upper abdomen. Duplicated inferior vena cava with small left-sided inferior vena cava. No signs of retroperitoneal lymphadenopathy. Reproductive: Normal CT appearance of uterus and adnexa. Other: No abdominal wall hernia or abnormality. No abdominopelvic ascites. Musculoskeletal: No signs of acute bone finding or destructive bone process. IMPRESSION: 1. Post treatment changes in the right chest similar to prior study. 2. New 4 mm pulmonary nodule in the right lower lobe. This small nodule and another ill-defined right upper lobe nodule are suspicious given patient history for early signs of additional disease. Short interval follow-up may be warranted given small size the largest lesion. 3. Stable large right adrenal adenoma. Aortic Atherosclerosis (ICD10-I70.0). Electronically Signed   By: Zetta Bills M.D.   On: 02/14/2020 18:04    ASSESSMENT AND PLAN:  This is a very pleasant 68  years old white female with recurrent small cell lung cancer that was initially diagnosed as limited stage disease status post a course of systemic chemotherapy with cisplatin and etoposide for 6 cycles concurrent with radiation. She tolerated her treatment well except for fatigue. She is also status post prophylactic cranial irradiation. The patient has been in observation since March 2018. She was found on recent imaging studies to have evidence for disease recurrence with right lower lobe pulmonary nodules in addition to mediastinal lymphadenopathy. Repeat bronchoscopy with endobronchial ultrasound and biopsy confirmed recurrence of his small cell lung cancer. The patient  was started on systemic chemotherapy with carboplatin, etoposide and Tecentriq status post 12 cycles.  She has partial response after cycle #4 and starting from cycle #5 the patient is currently on maintenance treatment with Tecentriq.  She has been tolerating her treatment well with no concerning adverse effect except for intermittent nausea that likely related to some of her other daily medications. The patient had repeat CT scan of the chest, abdomen pelvis performed recently.  I personally and independently reviewed the scans and discussed the results with the patient today. Her scan showed no concerning findings for disease progression except for new 4 mm pulmonary nodule in the right lower lobe that we will monitor closely on the upcoming imaging studies. I recommended for the patient to continue her current treatment with Tecentriq and she will proceed with cycle #16 today. For smoking cessation the patient was strongly encouraged to quit smoking. For the hypertension, she was advised to take her blood pressure medication as prescribed and to monitor it closely at home. The patient was advised to call immediately if she has any other concerning symptoms in the interval. The patient voices understanding of current disease status and treatment options and is in agreement with the current care plan.  All questions were answered. The patient knows to call the clinic with any problems, questions or concerns. We can certainly see the patient much sooner if necessary.  Disclaimer: This note was dictated with voice recognition software. Similar sounding words can inadvertently be transcribed and may not be corrected upon review.

## 2020-02-18 ENCOUNTER — Telehealth: Payer: Self-pay | Admitting: Internal Medicine

## 2020-02-18 NOTE — Telephone Encounter (Signed)
Scheduled per 3/1 los. Called and left msg. Mailed printout  

## 2020-03-04 ENCOUNTER — Other Ambulatory Visit: Payer: Self-pay | Admitting: Internal Medicine

## 2020-03-04 DIAGNOSIS — R5382 Chronic fatigue, unspecified: Secondary | ICD-10-CM

## 2020-03-05 DIAGNOSIS — Z23 Encounter for immunization: Secondary | ICD-10-CM | POA: Diagnosis not present

## 2020-03-08 NOTE — Progress Notes (Signed)
[place] OFFICE PROGRESS NOTE  Koirala, Dibas, MD [place] [address] [address]  DIAGNOSIS: Recurrent small cell lung cancer initially diagnosed as Limited stage (T2b, N2, M0) small cell lung cancer diagnosed in November 2017 and presented with large right hilar mass with mediastinal invasion and a right lower lobe pulmonary nodule. The patient had disease recurrence in the right lower lobe and mediastinal lymph nodes in February 2020.  PRIOR THERAPY:  1) Systemic chemotherapy with cisplatin 60 MG/M2 on day 1 and etoposide 120 MG/M2 on days 1, 2 and 3 concurrent with radiation status post 6 cycles. Last dose was giving March 06, 2017. 2) status post prophylactic cranial irradiation.  CURRENT THERAPY: Systemic chemotherapy with carboplatin for AUC of 5 on day 1, etoposide. 100 mg/M2 on days 1, 2 and 3 as well as Tecentriq 1200 mg IV with Neulasta support every 3 weeks. First dose March 05, 2019. Status post13cycles. Starting from cycle 5, the patient will be treated with single agent Tecentriq 1200 mg IV every 3 weeks. First dose May 28, 2019.  INTERVAL HISTORY: Tammy Bowers 69 y.o. female returns to the clinic for a follow up visit. The patient is feeling well today without any concerning complaints except she is requesting a refill of her Advair which is managed by her PCP. She describes that their is some issue with some financial concern with their office before obtaining a refill and needing an office visit with them before it can be refilled.  The patient continues to tolerate treatment with tecentriq well without any adverse effects. Denies any fever, chills, night sweats, or weight loss. Denies any chest pain or hemoptysis. Reports more shortness of breath due to not having inhalers. She continues to smoke cigarettes but is cutting back. She is currently smoking 4 cigarettes per day. Denies any vomiting,constipation, or diarrhea but endorses  occasional nausea. She states she is experiencing reflux but she is not interested in taking OTC medications for this at this time. Denies any headache, visual changes, changes in her gait, or seizures at this time. She denies any rashes or skin changes. She is here for evaluation before starting cycle #14 today as scheduled.    MEDICAL HISTORY: Past Medical History:  Diagnosis Date  . Adenopathy 10/10/2016   PERICARINAL  . Anemia   . Arthritis   . Asthma   . Cancer ([place])    lung cancer Small Cell  . COPD (chronic obstructive pulmonary disease) ([place])   . Dyspnea   . Headache    migraines  . Hilar mass 10/10/2016   RIGHT  . History of kidney stones   . Insomnia   . Lung nodules 10/10/2016   RIGHT LOWER LOBE  . Mass of both adrenal glands ([place]) 10/10/2016  . Mood swing   . Pneumonia   . Spinal headache   . Substance abuse ([place])    TOBACCO  . UNSPECIFIED INFECTION OF BONE ANKLE AND FOOT 10/22/2009   Annotation: left ankle Qualifier: Diagnosis of  By: Patsy Baltimore RN, Denise      ALLERGIES:  is allergic to lidocaine.  MEDICATIONS:  Current Outpatient Medications  Medication Sig Dispense Refill  . albuterol (PROVENTIL HFA;VENTOLIN HFA) 108 (90 BASE) MCG/ACT inhaler Inhale 2 puffs into the lungs every 4 (four) hours as needed for wheezing.    . Cyclobenzaprine HCl (FLEXERIL PO) Take 5 mg by mouth 3 (three) times daily as needed (muscle spasms).     . diphenoxylate-atropine (LOMOTIL) 2.5-0.025  MG tablet Take 1 tablet by mouth 4 (four) times daily as needed for diarrhea or loose stools. 30 tablet 0  . Fluticasone-Salmeterol (ADVAIR) 500-50 MCG/DOSE AEPB Inhale 1 puff into the lungs 2 (two) times daily.    Marland Kitchen ibuprofen (ADVIL) 800 MG tablet Take 800 mg by mouth every 8 (eight) hours as needed.    Marland Kitchen levothyroxine (SYNTHROID) 75 MCG tablet TAKE 1 TABLET BY MOUTH EVERY DAY 90 tablet 0  . LORazepam (ATIVAN) 0.5 MG tablet Take 1 tablet (0.5 mg total) by mouth at bedtime. (Patient not taking:  Reported on 01/27/2020) 20 tablet 0  . Multiple Vitamin (MULTIVITAMIN WITH MINERALS) TABS tablet Take 1 tablet by mouth daily.     . prochlorperazine (COMPAZINE) 10 MG tablet TAKE 1 TABLET(10 MG) BY MOUTH EVERY 6 HOURS AS NEEDED FOR NAUSEA OR VOMITING 30 tablet 0  . sertraline (ZOLOFT) 50 MG tablet Take 50 mg by mouth at bedtime.  5   No current facility-administered medications for this visit.   Facility-Administered Medications Ordered in Other Visits  Medication Dose Route Frequency Provider Last Rate Last Admin  . 0.9 %  sodium chloride infusion   Intravenous Once Curt Bears, MD      . [facility]) 1,200 mg in sodium chloride 0.9 % 250 mL chemo infusion  1,200 mg Intravenous Once Curt Bears, MD      . heparin lock flush 100 unit/mL  500 Units Intracatheter Once PRN Curt Bears, MD      . sodium chloride flush (NS) 0.9 % injection 10 mL  10 mL Intracatheter PRN Curt Bears, MD        SURGICAL HISTORY:  Past Surgical History:  Procedure Laterality Date  . ANKLE SURGERY     hardware removal, and initial ankle surgery  . APPENDECTOMY    . IR IMAGING GUIDED PORT INSERTION  04/12/2019  . LUNG BIOPSY N/A 10/19/2016   Procedure: LUNG BIOPSY;  Surgeon: Grace Isaac, MD;  Location: [facility];  Service: Thoracic;  Laterality: N/A;  . LUNG BIOPSY N/A 02/20/2019   Procedure: LUNG BIOPSY;  Surgeon: Grace Isaac, MD;  Location: [facility];  Service: Thoracic;  Laterality: N/A;  . TONSILLECTOMY    . VIDEO BRONCHOSCOPY WITH ENDOBRONCHIAL ULTRASOUND N/A 10/19/2016   Procedure: VIDEO BRONCHOSCOPY WITH ENDOBRONCHIAL ULTRASOUND;  Surgeon: Grace Isaac, MD;  Location: [facility];  Service: Thoracic;  Laterality: N/A;  . VIDEO BRONCHOSCOPY WITH ENDOBRONCHIAL ULTRASOUND N/A 02/20/2019   Procedure: VIDEO BRONCHOSCOPY WITH ENDOBRONCHIAL ULTRASOUND;  Surgeon: Grace Isaac, MD;  Location: [facility];  Service: Thoracic;  Laterality: N/A;    REVIEW OF SYSTEMS:   Review of Systems   Constitutional: Negative for appetite change, chills, fatigue, fever and unexpected weight change.  HENT: Negative for mouth sores, nosebleeds, sore throat and trouble swallowing.  Eyes: Negative for eye problems and icterus.  Respiratory:Positive cough and dyspnea on exertion.Negative for hemoptysis, shortness of breath and wheezing.  Cardiovascular: Negative for chest pain and leg swelling.  Gastrointestinal: Positive for occasional nausea. Negative for abdominal pain, constipation, diarrhea, and vomiting.  Genitourinary: Negative for bladder incontinence, difficulty urinating, dysuria, frequency and hematuria.  Musculoskeletal: Negative for back pain, gait problem, neck pain and neck stiffness.  Skin: Negative for itching and rash.  Neurological: Negative for dizziness, extremity weakness, gait problem, headaches, light-headedness and seizures.  Hematological: Negative for adenopathy. Does not bruise/bleed easily.  Psychiatric/Behavioral: Negative for confusion, depression and sleep disturbance. The patient is not nervous/anxious.   PHYSICAL EXAMINATION:  Blood pressure 122/90, pulse 97, temperature 98.7 F (37.1 C), temperature source Oral, resp. rate 19, height 5\' 6"  (1.676 m), weight 204 lb 14.4 oz (92.9 kg), SpO2 95 %.  ECOG PERFORMANCE STATUS: 1 - Symptomatic but completely ambulatory  Physical Exam  Constitutional: Oriented to person, place, and time and well-developed, well-nourished, and in no distress.  HENT:  Head: Normocephalic and atraumatic.  Mouth/Throat: Oropharynx is clear and moist. No oropharyngeal exudate.  Eyes: Conjunctivae are normal. Right eye exhibits no discharge. Left eye exhibits no discharge. No scleral icterus.  Neck: Normal range of motion. Neck supple.  Cardiovascular: Normal rate, regular rhythm, normal heart sounds and intact distal pulses.   Pulmonary/Chest: Effort normal. Mild wheezing. No respiratory distress. No rales.  Abdominal: Soft.  Bowel sounds are normal. Exhibits no distension and no mass. There is no tenderness.  Musculoskeletal: Normal range of motion. Exhibits no edema.  Lymphadenopathy:    No cervical adenopathy.  Neurological: Alert and oriented to person, place, and time. Exhibits normal muscle tone. Coordination normal. Ambulates with cane. Skin: Skin is warm and dry. No rash noted. Not diaphoretic. No erythema. No pallor.  Psychiatric: Mood, memory and judgment normal.  Vitals reviewed.  LABORATORY DATA: Lab Results  Component Value Date   WBC 8.3 03/09/2020   HGB 13.8 03/09/2020   HCT 40.9 03/09/2020   MCV 92.5 03/09/2020   PLT 295 03/09/2020      Chemistry      Component Value Date/Time   NA 142 03/09/2020 1133   NA 139 11/24/2017 0913   K 4.0 03/09/2020 1133   K 3.8 11/24/2017 0913   CL 106 03/09/2020 1133   CO2 26 03/09/2020 1133   CO2 24 11/24/2017 0913   BUN 24 (H) 03/09/2020 1133   BUN 22.1 11/24/2017 0913   CREATININE 1.18 (H) 03/09/2020 1133   CREATININE 1.2 (H) 11/24/2017 0913      Component Value Date/Time   CALCIUM 9.4 03/09/2020 1133   CALCIUM 9.7 11/24/2017 0913   ALKPHOS 109 03/09/2020 1133   ALKPHOS 99 11/24/2017 0913   AST 12 (L) 03/09/2020 1133   AST 15 11/24/2017 0913   ALT 7 03/09/2020 1133   ALT 7 11/24/2017 0913   BILITOT 0.2 (L) 03/09/2020 1133   BILITOT 0.37 11/24/2017 0913       RADIOGRAPHIC STUDIES:  CT Chest W Contrast  Result Date: 02/14/2020 CLINICAL DATA:  Small cell lung cancer, assess treatment response initially diagnosed with limited stage small cell lung cancer in 2017, found to have recurrence in February of 2020 in the right lower lobe and in mediastinal nodes. Currently undergoing systemic therapy. EXAM: CT CHEST, ABDOMEN, AND PELVIS WITH CONTRAST TECHNIQUE: Multidetector CT imaging of the chest, abdomen and pelvis was performed following the standard protocol during bolus administration of intravenous contrast. CONTRAST:  130mL OMNIPAQUE  IOHEXOL 300 MG/ML  SOLN COMPARISON:  12/09/2019 FINDINGS: CT CHEST FINDINGS Cardiovascular: Right IJ Port-A-Cath terminates in the distal superior vena cava. Calcified atherosclerotic changes of the thoracic aorta are similar to the prior study. Heart size is stable without pericardial effusion. Distortion of right hilum secondary to post treatment changes. Central pulmonary vasculature otherwise unremarkable with limited assessment. Mediastinum/Nodes: Right perihilar post treatment changes. Partially calcified subcarinal lymph node measures 10 mm short axis and is unchanged. No signs of thoracic inlet or axillary lymphadenopathy. Lungs/Pleura: Right perihilar fibrosis measuring 3.4 x 2.9 cm, previously approximately 3.4 x 2.9 cm. Calcified granuloma in the right middle lobe. New small pulmonary nodule  in the right lower lobe (image 65, series 4) 4 mm. Subtle area of nodularity just above the major fissure in the right chest in the right upper lobe (image 50, series 4) 3 mm. There was motion artifact in this area on the previous study. It is not clear whether the above very faint nodule was present. No signs of consolidation or evidence of pleural effusion. Mild bronchial wall thickening throughout with similar appearance. Musculoskeletal: No signs of chest wall mass. See below for further information regarding musculoskeletal findings. CT ABDOMEN PELVIS FINDINGS Hepatobiliary: No signs of focal, suspicious hepatic lesion. The portal vein is patent. Pancreas: Pancreas is normal without ductal dilation or peripancreatic inflammation. Spleen: Spleen is normal size. Signs of granulomatous disease in the spleen. Adrenals/Urinary Tract: Well-circumscribed right adrenal nodule measures 3.4 x 2.8 cm unchanged since 2017. Left adrenal is normal. Tiny hypodensity in the upper pole of the left kidney is unchanged. No signs of hydronephrosis. No visible calculi. Normal appearance of the urinary bladder. Stomach/Bowel: No signs  of acute gastrointestinal process. Mild distension of proximal small bowel loops with gradual transition is presumably related to ingestion of enteric contrast media. Stool throughout the colon. Appendix not visualized though there are no pericecal inflammatory changes. Vascular/Lymphatic: Scattered atherosclerosis throughout the abdominal aorta. No signs of adenopathy in the retroperitoneum or upper abdomen. Duplicated inferior vena cava with small left-sided inferior vena cava. No signs of retroperitoneal lymphadenopathy. Reproductive: Normal CT appearance of uterus and adnexa. Other: No abdominal wall hernia or abnormality. No abdominopelvic ascites. Musculoskeletal: No signs of acute bone finding or destructive bone process. IMPRESSION: 1. Post treatment changes in the right chest similar to prior study. 2. New 4 mm pulmonary nodule in the right lower lobe. This small nodule and another ill-defined right upper lobe nodule are suspicious given patient history for early signs of additional disease. Short interval follow-up may be warranted given small size the largest lesion. 3. Stable large right adrenal adenoma. Aortic Atherosclerosis (ICD10-I70.0). Electronically Signed   By: Zetta Bills M.D.   On: 02/14/2020 18:04   CT Abdomen Pelvis W Contrast  Result Date: 02/14/2020 CLINICAL DATA:  Small cell lung cancer, assess treatment response initially diagnosed with limited stage small cell lung cancer in 2017, found to have recurrence in February of 2020 in the right lower lobe and in mediastinal nodes. Currently undergoing systemic therapy. EXAM: CT CHEST, ABDOMEN, AND PELVIS WITH CONTRAST TECHNIQUE: Multidetector CT imaging of the chest, abdomen and pelvis was performed following the standard protocol during bolus administration of intravenous contrast. CONTRAST:  143mL OMNIPAQUE IOHEXOL 300 MG/ML  SOLN COMPARISON:  12/09/2019 FINDINGS: CT CHEST FINDINGS Cardiovascular: Right IJ Port-A-Cath terminates in the  distal superior vena cava. Calcified atherosclerotic changes of the thoracic aorta are similar to the prior study. Heart size is stable without pericardial effusion. Distortion of right hilum secondary to post treatment changes. Central pulmonary vasculature otherwise unremarkable with limited assessment. Mediastinum/Nodes: Right perihilar post treatment changes. Partially calcified subcarinal lymph node measures 10 mm short axis and is unchanged. No signs of thoracic inlet or axillary lymphadenopathy. Lungs/Pleura: Right perihilar fibrosis measuring 3.4 x 2.9 cm, previously approximately 3.4 x 2.9 cm. Calcified granuloma in the right middle lobe. New small pulmonary nodule in the right lower lobe (image 65, series 4) 4 mm. Subtle area of nodularity just above the major fissure in the right chest in the right upper lobe (image 50, series 4) 3 mm. There was motion artifact in this area on the previous  study. It is not clear whether the above very faint nodule was present. No signs of consolidation or evidence of pleural effusion. Mild bronchial wall thickening throughout with similar appearance. Musculoskeletal: No signs of chest wall mass. See below for further information regarding musculoskeletal findings. CT ABDOMEN PELVIS FINDINGS Hepatobiliary: No signs of focal, suspicious hepatic lesion. The portal vein is patent. Pancreas: Pancreas is normal without ductal dilation or peripancreatic inflammation. Spleen: Spleen is normal size. Signs of granulomatous disease in the spleen. Adrenals/Urinary Tract: Well-circumscribed right adrenal nodule measures 3.4 x 2.8 cm unchanged since 2017. Left adrenal is normal. Tiny hypodensity in the upper pole of the left kidney is unchanged. No signs of hydronephrosis. No visible calculi. Normal appearance of the urinary bladder. Stomach/Bowel: No signs of acute gastrointestinal process. Mild distension of proximal small bowel loops with gradual transition is presumably related to  ingestion of enteric contrast media. Stool throughout the colon. Appendix not visualized though there are no pericecal inflammatory changes. Vascular/Lymphatic: Scattered atherosclerosis throughout the abdominal aorta. No signs of adenopathy in the retroperitoneum or upper abdomen. Duplicated inferior vena cava with small left-sided inferior vena cava. No signs of retroperitoneal lymphadenopathy. Reproductive: Normal CT appearance of uterus and adnexa. Other: No abdominal wall hernia or abnormality. No abdominopelvic ascites. Musculoskeletal: No signs of acute bone finding or destructive bone process. IMPRESSION: 1. Post treatment changes in the right chest similar to prior study. 2. New 4 mm pulmonary nodule in the right lower lobe. This small nodule and another ill-defined right upper lobe nodule are suspicious given patient history for early signs of additional disease. Short interval follow-up may be warranted given small size the largest lesion. 3. Stable large right adrenal adenoma. Aortic Atherosclerosis (ICD10-I70.0). Electronically Signed   By: Zetta Bills M.D.   On: 02/14/2020 18:04     ASSESSMENT/PLAN:  This is a very pleasant 69 year old Caucasian female with recurrent small cell lung cancer.She was initially diagnosed as limited stage (T2b, N2, M0)in 2017. She presented with alarge right hilar mass with mediastinal invasion and a right lower lobe pulmonary nodule.She is status post a course of systemic chemotherapy with cisplatin and etoposide for 6 cycles with concurrent radiation. She tolerated treatment well except for fatigue.  She also received prophylactic cranial irradiation. She had been on observation since March 2018.  Recent imaging studies from February 2020 showed evidence of disease recurrence within right lower lobe pulmonary nodules in addition to mediastinal lymphadenopathy. A repeat bronchoscopy with endobronchial ultrasound and biopsy confirmed recurrence of  small cell lung cancer.  She is currently undergoing systemic chemotherapy with carboplatin, etoposide, and Tecentriq.She is status post 13 cycles.Starting from cycle #5 the patient has beenreceiving single agent maintenance Tecentriq. She tolerated treatment well except for itching and fatigue.   Labs were reviewed. Recommend that she continue with cycle #14 today as scheduled.   We will see her back for a follow up visit in 3 weeks for evaluation before starting cycle #15.    Encouraged her to work out her insurance/financial concern with her PCPs office as I am unclear to what has been discussed and was was advised to resolve this concern. It sounds like their office recommended a visit to assess whether a refill was appropriate of her advair since it appears it has been several years since it was refills. Discussed that it is important to follow with her PCP for management of her other health conditions and to make sure she is up to date on healthcare screenings. Offered  a referral to a pulmonologist for management of her COPD. She declined and stated "no that is ok, I will just pay for my inhaler".   The patient was advised to call immediately if she has any concerning symptoms in the interval. The patient voices understanding of current disease status and treatment options and is in agreement with the current care plan. All questions were answered. The patient knows to call the clinic with any problems, questions or concerns. We can certainly see the patient much sooner if necessary      Orders Placed This Encounter  Procedures  . TSH    Standing Status:   Standing    Number of Occurrences:   15    Standing Expiration Date:   03/09/2021     Tobe Sos Tatum Massman, PA-C 03/09/20

## 2020-03-09 ENCOUNTER — Inpatient Hospital Stay: Payer: Medicare Other

## 2020-03-09 ENCOUNTER — Inpatient Hospital Stay (HOSPITAL_BASED_OUTPATIENT_CLINIC_OR_DEPARTMENT_OTHER): Payer: Medicare Other | Admitting: Physician Assistant

## 2020-03-09 ENCOUNTER — Other Ambulatory Visit: Payer: Self-pay

## 2020-03-09 VITALS — BP 122/90 | HR 97 | Temp 98.7°F | Resp 19 | Ht 66.0 in | Wt 204.9 lb

## 2020-03-09 DIAGNOSIS — C771 Secondary and unspecified malignant neoplasm of intrathoracic lymph nodes: Secondary | ICD-10-CM | POA: Diagnosis not present

## 2020-03-09 DIAGNOSIS — C3491 Malignant neoplasm of unspecified part of right bronchus or lung: Secondary | ICD-10-CM

## 2020-03-09 DIAGNOSIS — Z923 Personal history of irradiation: Secondary | ICD-10-CM | POA: Diagnosis not present

## 2020-03-09 DIAGNOSIS — R5383 Other fatigue: Secondary | ICD-10-CM | POA: Diagnosis not present

## 2020-03-09 DIAGNOSIS — Z9221 Personal history of antineoplastic chemotherapy: Secondary | ICD-10-CM | POA: Diagnosis not present

## 2020-03-09 DIAGNOSIS — Z5112 Encounter for antineoplastic immunotherapy: Secondary | ICD-10-CM

## 2020-03-09 DIAGNOSIS — C7801 Secondary malignant neoplasm of right lung: Secondary | ICD-10-CM | POA: Diagnosis not present

## 2020-03-09 DIAGNOSIS — Z95828 Presence of other vascular implants and grafts: Secondary | ICD-10-CM

## 2020-03-09 DIAGNOSIS — C3401 Malignant neoplasm of right main bronchus: Secondary | ICD-10-CM | POA: Diagnosis not present

## 2020-03-09 LAB — CMP (CANCER CENTER ONLY)
ALT: 7 U/L (ref 0–44)
AST: 12 U/L — ABNORMAL LOW (ref 15–41)
Albumin: 3.3 g/dL — ABNORMAL LOW (ref 3.5–5.0)
Alkaline Phosphatase: 109 U/L (ref 38–126)
Anion gap: 10 (ref 5–15)
BUN: 24 mg/dL — ABNORMAL HIGH (ref 8–23)
CO2: 26 mmol/L (ref 22–32)
Calcium: 9.4 mg/dL (ref 8.9–10.3)
Chloride: 106 mmol/L (ref 98–111)
Creatinine: 1.18 mg/dL — ABNORMAL HIGH (ref 0.44–1.00)
GFR, Est AFR Am: 56 mL/min — ABNORMAL LOW (ref >60)
GFR, Estimated: 48 mL/min — ABNORMAL LOW (ref >60)
Glucose, Bld: 87 mg/dL (ref 70–99)
Potassium: 4 mmol/L (ref 3.5–5.1)
Sodium: 142 mmol/L (ref 135–145)
Total Bilirubin: 0.2 mg/dL — ABNORMAL LOW (ref 0.3–1.2)
Total Protein: 6.7 g/dL (ref 6.5–8.1)

## 2020-03-09 LAB — CBC WITH DIFFERENTIAL (CANCER CENTER ONLY)
Abs Immature Granulocytes: 0.01 10*3/uL (ref 0.00–0.07)
Basophils Absolute: 0.1 10*3/uL (ref 0.0–0.1)
Basophils Relative: 1 %
Eosinophils Absolute: 0.3 10*3/uL (ref 0.0–0.5)
Eosinophils Relative: 4 %
HCT: 40.9 % (ref 36.0–46.0)
Hemoglobin: 13.8 g/dL (ref 12.0–15.0)
Immature Granulocytes: 0 %
Lymphocytes Relative: 13 %
Lymphs Abs: 1 10*3/uL (ref 0.7–4.0)
MCH: 31.2 pg (ref 26.0–34.0)
MCHC: 33.7 g/dL (ref 30.0–36.0)
MCV: 92.5 fL (ref 80.0–100.0)
Monocytes Absolute: 0.6 10*3/uL (ref 0.1–1.0)
Monocytes Relative: 8 %
Neutro Abs: 6.2 10*3/uL (ref 1.7–7.7)
Neutrophils Relative %: 74 %
Platelet Count: 295 10*3/uL (ref 150–400)
RBC: 4.42 MIL/uL (ref 3.87–5.11)
RDW: 12.4 % (ref 11.5–15.5)
WBC Count: 8.3 10*3/uL (ref 4.0–10.5)
nRBC: 0 % (ref 0.0–0.2)

## 2020-03-09 MED ORDER — SODIUM CHLORIDE 0.9% FLUSH
10.0000 mL | INTRAVENOUS | Status: DC | PRN
  Administered 2020-03-09: 14:00:00 10 mL
  Filled 2020-03-09: qty 10

## 2020-03-09 MED ORDER — SODIUM CHLORIDE 0.9% FLUSH
10.0000 mL | Freq: Once | INTRAVENOUS | Status: AC
  Administered 2020-03-09: 10 mL
  Filled 2020-03-09: qty 10

## 2020-03-09 MED ORDER — HEPARIN SOD (PORK) LOCK FLUSH 100 UNIT/ML IV SOLN
500.0000 [IU] | Freq: Once | INTRAVENOUS | Status: AC | PRN
  Administered 2020-03-09: 500 [IU]
  Filled 2020-03-09: qty 5

## 2020-03-09 MED ORDER — SODIUM CHLORIDE 0.9 % IV SOLN
1200.0000 mg | Freq: Once | INTRAVENOUS | Status: AC
  Administered 2020-03-09: 14:00:00 1200 mg via INTRAVENOUS
  Filled 2020-03-09: qty 20

## 2020-03-09 MED ORDER — SODIUM CHLORIDE 0.9 % IV SOLN
Freq: Once | INTRAVENOUS | Status: AC
  Filled 2020-03-09: qty 250

## 2020-03-09 NOTE — Patient Instructions (Signed)
[facility] Discharge Instructions for Patients Receiving Chemotherapy  Today you received the following chemotherapy agents: Tecentriq  To help prevent nausea and vomiting after your treatment, we encourage you to take your nausea medication as directed.   If you develop nausea and vomiting that is not controlled by your nausea medication, call the clinic.   BELOW ARE SYMPTOMS THAT SHOULD BE REPORTED IMMEDIATELY:  *FEVER GREATER THAN 100.5 F  *CHILLS WITH OR WITHOUT FEVER  NAUSEA AND VOMITING THAT IS NOT CONTROLLED WITH YOUR NAUSEA MEDICATION  *UNUSUAL SHORTNESS OF BREATH  *UNUSUAL BRUISING OR BLEEDING  TENDERNESS IN MOUTH AND THROAT WITH OR WITHOUT PRESENCE OF ULCERS  *URINARY PROBLEMS  *BOWEL PROBLEMS  UNUSUAL RASH Items with * indicate a potential emergency and should be followed up as soon as possible.  Feel free to call the clinic should you have any questions or concerns. The clinic phone number is [phone].  Please show the CHEMO ALERT CARD at check-in to the Emergency Department and triage nurse.   

## 2020-03-24 NOTE — Progress Notes (Signed)
Pharmacist Chemotherapy Monitoring - Follow Up Assessment    I verify that I have reviewed each item in the below checklist:  . Regimen for the patient is scheduled for the appropriate day and plan matches scheduled date. Marland Kitchen Appropriate non-routine labs are ordered dependent on drug ordered. . If applicable, additional medications reviewed and ordered per protocol based on lifetime cumulative doses and/or treatment regimen.   Plan for follow-up and/or issues identified: No . I-vent associated with next due treatment: No . MD and/or nursing notified: No  Christo Hain D 03/24/2020 4:19 PM

## 2020-03-27 NOTE — Progress Notes (Signed)
[facility] OFFICE PROGRESS NOTE  Koirala, Dibas, MD [address] [id number]  DIAGNOSIS: Recurrent small cell lung cancer initially diagnosed as Limited stage (T2b, N2, M0) small cell lung cancer diagnosed in November 2017 and presented with large right hilar mass with mediastinal invasion and a right lower lobe pulmonary nodule. The patient had disease recurrence in the right lower lobe and mediastinal lymph nodes in February 2020.  PRIOR THERAPY: 1) Systemic chemotherapy with cisplatin 60 MG/M2 on day 1 and etoposide 120 MG/M2 on days 1, 2 and 3 concurrent with radiation status post 6 cycles. Last dose was giving March 06, 2017. 2) status post prophylactic cranial irradiation.  CURRENT THERAPY: Systemic chemotherapy with carboplatin for AUC of 5 on day 1, etoposide. 100 mg/M2 on days 1, 2 and 3 as well as Tecentriq 1200 mg IV with Neulasta support every 3 weeks. First dose March 05, 2019. Status post14cycles. Starting from cycle 5, the patient will be treated with single agent Tecentriq 1200 mg IV every 3 weeks. First dose May 28, 2019.  INTERVAL HISTORY: Tammy Bowers 69 y.o. female returns to the clinic for a follow up visit. The patient is feeling well today without any concerning complaints. She is scheduled to get her second COVID-19 shot in a few days. She tolerated the first one well without any major side effects. The patient continues to tolerate treatment with immunotherapy with Tecentriq well without any adverse side effects. Denies any fever, chills, night sweats, or weight loss. Denies any chest pain or hemoptysis. Reports baseline shortness of breath and cough. She continues to smoke approximately 4 cigarettes per day. Denies any nausea, vomiting, diarrhea, or constipation. Denies any headache. She has baseline stable visual changes which she attributes to needing to go to the eye doctor. It has been two year since her last  appointment. Denies any rashes or skin changes. The patient's husband is a Biomedical scientist and during the Summer months, they relocate to [place] for her husband's job. They are planning on relocating at the end of May. She is established with oncologist Dr. Lockie Pares in [place]. The patient is here today for evaluation prior to starting cycle # 15.  MEDICAL HISTORY: Past Medical History:  Diagnosis Date  . Adenopathy 10/10/2016   PERICARINAL  . Anemia   . Arthritis   . Asthma   . Cancer ([place])    lung cancer Small Cell  . COPD (chronic obstructive pulmonary disease) ([place])   . Dyspnea   . Headache    migraines  . Hilar mass 10/10/2016   RIGHT  . History of kidney stones   . Insomnia   . Lung nodules 10/10/2016   RIGHT LOWER LOBE  . Mass of both adrenal glands ([place]) 10/10/2016  . Mood swing   . Pneumonia   . Spinal headache   . Substance abuse ([place])    TOBACCO  . UNSPECIFIED INFECTION OF BONE ANKLE AND FOOT 10/22/2009   Annotation: left ankle Qualifier: Diagnosis of  By: Patsy [place] RN, Denise      ALLERGIES:  is allergic to lidocaine.  MEDICATIONS:  Current Outpatient Medications  Medication Sig Dispense Refill  . albuterol (PROVENTIL HFA;VENTOLIN HFA) 108 (90 BASE) MCG/ACT inhaler Inhale 2 puffs into the lungs every 4 (four) hours as needed for wheezing.    . Cyclobenzaprine HCl (FLEXERIL PO) Take 5 mg by mouth 3 (three) times daily as needed (muscle spasms).     . diphenoxylate-atropine (LOMOTIL) 2.5-0.025 MG tablet  Take 1 tablet by mouth 4 (four) times daily as needed for diarrhea or loose stools. 30 tablet 0  . Fluticasone-Salmeterol (ADVAIR) 500-50 MCG/DOSE AEPB Inhale 1 puff into the lungs 2 (two) times daily.    Marland Kitchen ibuprofen (ADVIL) 800 MG tablet Take 800 mg by mouth every 8 (eight) hours as needed.    Marland Kitchen levothyroxine (SYNTHROID) 75 MCG tablet TAKE 1 TABLET BY MOUTH EVERY DAY 90 tablet 0  . LORazepam (ATIVAN) 0.5 MG tablet Take 1 tablet (0.5 mg total) by mouth at bedtime. (Patient not  taking: Reported on 01/27/2020) 20 tablet 0  . Multiple Vitamin (MULTIVITAMIN WITH MINERALS) TABS tablet Take 1 tablet by mouth daily.     . prochlorperazine (COMPAZINE) 10 MG tablet TAKE 1 TABLET(10 MG) BY MOUTH EVERY 6 HOURS AS NEEDED FOR NAUSEA OR VOMITING 30 tablet 0  . sertraline (ZOLOFT) 50 MG tablet Take 50 mg by mouth at bedtime.  5   No current facility-administered medications for this visit.    SURGICAL HISTORY:  Past Surgical History:  Procedure Laterality Date  . ANKLE SURGERY     hardware removal, and initial ankle surgery  . APPENDECTOMY    . IR IMAGING GUIDED PORT INSERTION  04/12/2019  . LUNG BIOPSY N/A 10/19/2016   Procedure: LUNG BIOPSY;  Surgeon: Grace Isaac, MD;  Location: [facility];  Service: Thoracic;  Laterality: N/A;  . LUNG BIOPSY N/A 02/20/2019   Procedure: LUNG BIOPSY;  Surgeon: Grace Isaac, MD;  Location: [facility];  Service: Thoracic;  Laterality: N/A;  . TONSILLECTOMY    . VIDEO BRONCHOSCOPY WITH ENDOBRONCHIAL ULTRASOUND N/A 10/19/2016   Procedure: VIDEO BRONCHOSCOPY WITH ENDOBRONCHIAL ULTRASOUND;  Surgeon: Grace Isaac, MD;  Location: [facility];  Service: Thoracic;  Laterality: N/A;  . VIDEO BRONCHOSCOPY WITH ENDOBRONCHIAL ULTRASOUND N/A 02/20/2019   Procedure: VIDEO BRONCHOSCOPY WITH ENDOBRONCHIAL ULTRASOUND;  Surgeon: Grace Isaac, MD;  Location: [facility];  Service: Thoracic;  Laterality: N/A;    REVIEW OF SYSTEMS:   Review of Systems  Constitutional: Negative for appetite change, chills, fatigue, fever and unexpected weight change.  HENT: Negative for mouth sores, nosebleeds, sore throat and trouble swallowing.  Eyes: Positive for bilateral visual blurring. Negative for eye problems and icterus.  Respiratory:Positive cough and dyspnea on exertion.Negative for hemoptysis, shortness of breath and wheezing.  Cardiovascular: Negative for chest pain and leg swelling.  Gastrointestinal: Negative for abdominal pain, constipation, nausea, diarrhea, and  vomiting.  Genitourinary: Negative for bladder incontinence, difficulty urinating, dysuria, frequency and hematuria.  Musculoskeletal: Negative for back pain, gait problem, neck pain and neck stiffness.  Skin: Negative for itching and rash.  Neurological: Negative for dizziness, extremity weakness, gait problem, headaches, light-headedness and seizures.  Hematological: Negative for adenopathy. Does not bruise/bleed easily.  Psychiatric/Behavioral: Positive for insomnia. Negative for confusion, depression and sleep disturbance. The patient is not nervous/anxious.    PHYSICAL EXAMINATION:  There were no vitals taken for this visit.  ECOG PERFORMANCE STATUS: 1 - Symptomatic but completely ambulatory  Physical Exam  Constitutional: Oriented to person, place, and time and well-developed, well-nourished, and in no distress. No distress.  HENT:  Head: Normocephalic and atraumatic.  Mouth/Throat: Oropharynx is clear and moist. No oropharyngeal exudate.  Eyes: Conjunctivae are normal. Right eye exhibits no discharge. Left eye exhibits no discharge. No scleral icterus.  Neck: Normal range of motion. Neck supple.  Cardiovascular: Normal rate, regular rhythm, normal heart sounds and intact distal pulses.   Pulmonary/Chest: Effort normal. Quiet breath sounds bilaterally.  No respiratory distress. No wheezes. No rales.  Abdominal: Soft. Bowel sounds are normal. Exhibits no distension and no mass. There is no tenderness.  Musculoskeletal: Normal range of motion. Exhibits no edema.  Lymphadenopathy:    No cervical adenopathy.  Neurological: Alert and oriented to person, place, and time. Exhibits normal muscle tone. Gait normal. Ambulates with cane. Skin: Skin is warm and dry. No rash noted. Not diaphoretic. No erythema. No pallor.  Psychiatric: Mood, memory and judgment normal.  Vitals reviewed.  LABORATORY DATA: Lab Results  Component Value Date   WBC 8.3 03/09/2020   HGB 13.8 03/09/2020    HCT 40.9 03/09/2020   MCV 92.5 03/09/2020   PLT 295 03/09/2020      Chemistry      Component Value Date/Time   NA 142 03/09/2020 1133   NA 139 11/24/2017 0913   K 4.0 03/09/2020 1133   K 3.8 11/24/2017 0913   CL 106 03/09/2020 1133   CO2 26 03/09/2020 1133   CO2 24 11/24/2017 0913   BUN 24 (H) 03/09/2020 1133   BUN 22.1 11/24/2017 0913   CREATININE 1.18 (H) 03/09/2020 1133   CREATININE 1.2 (H) 11/24/2017 0913      Component Value Date/Time   CALCIUM 9.4 03/09/2020 1133   CALCIUM 9.7 11/24/2017 0913   ALKPHOS 109 03/09/2020 1133   ALKPHOS 99 11/24/2017 0913   AST 12 (L) 03/09/2020 1133   AST 15 11/24/2017 0913   ALT 7 03/09/2020 1133   ALT 7 11/24/2017 0913   BILITOT 0.2 (L) 03/09/2020 1133   BILITOT 0.37 11/24/2017 0913       RADIOGRAPHIC STUDIES:  No results found.   ASSESSMENT/PLAN:  This is a very pleasant 69 year old Caucasian female with recurrent small cell lung cancer.She was initially diagnosed as limited stage (T2b, N2, M0)in 2017. She presented with alarge right hilar mass with mediastinal invasion and a right lower lobe pulmonary nodule.She is status post a course of systemic chemotherapy with cisplatin and etoposide for 6 cycles with concurrent radiation. She tolerated treatment well except for fatigue.  She also received prophylactic cranial irradiation. She had been on observation since March 2018.  Recent imaging studies from February 2020 showed evidence of disease recurrence within right lower lobe pulmonary nodules in addition to mediastinal lymphadenopathy. A repeat bronchoscopy with endobronchial ultrasound and biopsy confirmed recurrence of small cell lung cancer.  She is currently undergoing systemic chemotherapy with carboplatin, etoposide, and Tecentriq.She is status post14cycles.Starting from cycle #5 the patient has beenreceiving single agent maintenance Tecentriq. She tolerated treatment well except for itching and  fatigue.  Labs were reviewed. Recommend that she continue with cycle #15 today as scheduled.   I will arrange for a restaging CT scan of her chest, abdomen, and pelvis prior to her next cycle of treatment.   We will see her back for a follow up visit in 3 weeks for evaluation and to review her scan results before starting cycle #16.   She will keep [place] posted when she needs to transfer her care to Dr. Lockie Pares in [place].   The patient was advised to call immediately if she has any concerning symptoms in the interval. The patient voices understanding of current disease status and treatment options and is in agreement with the current care plan. All questions were answered. The patient knows to call the clinic with any problems, questions or concerns. We can certainly see the patient much sooner if necessary   No orders of the defined types  were placed in this encounter.    Donny Heffern L Marcianna Daily, PA-C 03/27/20

## 2020-03-30 ENCOUNTER — Telehealth: Payer: Self-pay | Admitting: Physician Assistant

## 2020-03-30 ENCOUNTER — Inpatient Hospital Stay: Payer: Medicare Other

## 2020-03-30 ENCOUNTER — Encounter: Payer: Self-pay | Admitting: Physician Assistant

## 2020-03-30 ENCOUNTER — Inpatient Hospital Stay: Payer: Medicare Other | Attending: Internal Medicine | Admitting: Physician Assistant

## 2020-03-30 ENCOUNTER — Other Ambulatory Visit: Payer: Self-pay

## 2020-03-30 VITALS — HR 94

## 2020-03-30 VITALS — BP 105/83 | HR 103 | Temp 98.3°F | Resp 17 | Ht 66.0 in | Wt 205.7 lb

## 2020-03-30 DIAGNOSIS — Z7951 Long term (current) use of inhaled steroids: Secondary | ICD-10-CM | POA: Diagnosis not present

## 2020-03-30 DIAGNOSIS — C3431 Malignant neoplasm of lower lobe, right bronchus or lung: Secondary | ICD-10-CM | POA: Diagnosis not present

## 2020-03-30 DIAGNOSIS — R5383 Other fatigue: Secondary | ICD-10-CM

## 2020-03-30 DIAGNOSIS — Z923 Personal history of irradiation: Secondary | ICD-10-CM | POA: Diagnosis not present

## 2020-03-30 DIAGNOSIS — C771 Secondary and unspecified malignant neoplasm of intrathoracic lymph nodes: Secondary | ICD-10-CM | POA: Insufficient documentation

## 2020-03-30 DIAGNOSIS — C3491 Malignant neoplasm of unspecified part of right bronchus or lung: Secondary | ICD-10-CM

## 2020-03-30 DIAGNOSIS — J449 Chronic obstructive pulmonary disease, unspecified: Secondary | ICD-10-CM | POA: Insufficient documentation

## 2020-03-30 DIAGNOSIS — F17210 Nicotine dependence, cigarettes, uncomplicated: Secondary | ICD-10-CM | POA: Insufficient documentation

## 2020-03-30 DIAGNOSIS — Z79899 Other long term (current) drug therapy: Secondary | ICD-10-CM | POA: Diagnosis not present

## 2020-03-30 DIAGNOSIS — Z5112 Encounter for antineoplastic immunotherapy: Secondary | ICD-10-CM | POA: Insufficient documentation

## 2020-03-30 LAB — CMP (CANCER CENTER ONLY)
ALT: 9 U/L (ref 0–44)
AST: 13 U/L — ABNORMAL LOW (ref 15–41)
Albumin: 3.3 g/dL — ABNORMAL LOW (ref 3.5–5.0)
Alkaline Phosphatase: 111 U/L (ref 38–126)
Anion gap: 8 (ref 5–15)
BUN: 17 mg/dL (ref 8–23)
CO2: 26 mmol/L (ref 22–32)
Calcium: 9.1 mg/dL (ref 8.9–10.3)
Chloride: 106 mmol/L (ref 98–111)
Creatinine: 1.19 mg/dL — ABNORMAL HIGH (ref 0.44–1.00)
GFR, Est AFR Am: 55 mL/min — ABNORMAL LOW (ref >60)
GFR, Estimated: 47 mL/min — ABNORMAL LOW (ref >60)
Glucose, Bld: 96 mg/dL (ref 70–99)
Potassium: 3.8 mmol/L (ref 3.5–5.1)
Sodium: 140 mmol/L (ref 135–145)
Total Bilirubin: 0.4 mg/dL (ref 0.3–1.2)
Total Protein: 6.8 g/dL (ref 6.5–8.1)

## 2020-03-30 LAB — CBC WITH DIFFERENTIAL (CANCER CENTER ONLY)
Abs Immature Granulocytes: 0.02 10*3/uL (ref 0.00–0.07)
Basophils Absolute: 0.1 10*3/uL (ref 0.0–0.1)
Basophils Relative: 1 %
Eosinophils Absolute: 0.3 10*3/uL (ref 0.0–0.5)
Eosinophils Relative: 4 %
HCT: 42 % (ref 36.0–46.0)
Hemoglobin: 13.7 g/dL (ref 12.0–15.0)
Immature Granulocytes: 0 %
Lymphocytes Relative: 11 %
Lymphs Abs: 0.8 10*3/uL (ref 0.7–4.0)
MCH: 30.3 pg (ref 26.0–34.0)
MCHC: 32.6 g/dL (ref 30.0–36.0)
MCV: 92.9 fL (ref 80.0–100.0)
Monocytes Absolute: 0.5 10*3/uL (ref 0.1–1.0)
Monocytes Relative: 7 %
Neutro Abs: 5.8 10*3/uL (ref 1.7–7.7)
Neutrophils Relative %: 77 %
Platelet Count: 289 10*3/uL (ref 150–400)
RBC: 4.52 MIL/uL (ref 3.87–5.11)
RDW: 13.3 % (ref 11.5–15.5)
WBC Count: 7.5 10*3/uL (ref 4.0–10.5)
nRBC: 0 % (ref 0.0–0.2)

## 2020-03-30 LAB — TSH: TSH: 6.192 u[IU]/mL — ABNORMAL HIGH (ref 0.308–3.960)

## 2020-03-30 MED ORDER — SODIUM CHLORIDE 0.9 % IV SOLN
1200.0000 mg | Freq: Once | INTRAVENOUS | Status: AC
  Administered 2020-03-30: 1200 mg via INTRAVENOUS
  Filled 2020-03-30: qty 20

## 2020-03-30 MED ORDER — SODIUM CHLORIDE 0.9 % IV SOLN
Freq: Once | INTRAVENOUS | Status: AC
  Filled 2020-03-30: qty 250

## 2020-03-30 MED ORDER — HEPARIN SOD (PORK) LOCK FLUSH 100 UNIT/ML IV SOLN
500.0000 [IU] | Freq: Once | INTRAVENOUS | Status: AC | PRN
  Administered 2020-03-30: 500 [IU]
  Filled 2020-03-30: qty 5

## 2020-03-30 MED ORDER — SODIUM CHLORIDE 0.9% FLUSH
10.0000 mL | INTRAVENOUS | Status: DC | PRN
  Administered 2020-03-30: 10 mL
  Filled 2020-03-30: qty 10

## 2020-03-30 NOTE — Patient Instructions (Signed)
[facility] Discharge Instructions for Patients Receiving Chemotherapy  Today you received the following Immunotherapy agent: Atezolizumab(Tecentriq)  To help prevent nausea and vomiting after your treatment, we encourage you to take your nausea medication as directed by your MD.   If you develop nausea and vomiting that is not controlled by your nausea medication, call the clinic.   BELOW ARE SYMPTOMS THAT SHOULD BE REPORTED IMMEDIATELY:  *FEVER GREATER THAN 100.5 F  *CHILLS WITH OR WITHOUT FEVER  NAUSEA AND VOMITING THAT IS NOT CONTROLLED WITH YOUR NAUSEA MEDICATION  *UNUSUAL SHORTNESS OF BREATH  *UNUSUAL BRUISING OR BLEEDING  TENDERNESS IN MOUTH AND THROAT WITH OR WITHOUT PRESENCE OF ULCERS  *URINARY PROBLEMS  *BOWEL PROBLEMS  UNUSUAL RASH Items with * indicate a potential emergency and should be followed up as soon as possible.  Feel free to call the clinic should you have any questions or concerns. The clinic phone number is [phone].  Please show the [facility] at check-in to the Emergency Department and triage nurse.  Coronavirus (COVID-19) Are you at risk?  Are you at risk for the Coronavirus (COVID-19)?  To be considered HIGH RISK for Coronavirus (COVID-19), you have to meet the following criteria:  . Traveled to [place], [place], [place], [place] or [place]; or in the [place] to [place], [place], [place], or [place]; and have fever, cough, and shortness of breath within the last 2 weeks of travel OR . Been in close contact with a person diagnosed with COVID-19 within the last 2 weeks and have fever, cough, and shortness of breath . IF YOU DO NOT MEET THESE CRITERIA, YOU ARE CONSIDERED LOW RISK FOR COVID-19.  What to do if you are HIGH RISK for COVID-19?  Marland Kitchen If you are having a medical emergency, call 911. . Seek medical care right away. Before you go to a doctor's office, urgent care or emergency department, call ahead and  tell them about your recent travel, contact with someone diagnosed with COVID-19, and your symptoms. You should receive instructions from your physician's office regarding next steps of care.  . When you arrive at healthcare provider, tell the healthcare staff immediately you have returned from visiting [place], [place], [place], [place] or [place]; or traveled in the [place] to [place], [place], [place], or [place]; in the last two weeks or you have been in close contact with a person diagnosed with COVID-19 in the last 2 weeks.   . Tell the health care staff about your symptoms: fever, cough and shortness of breath. . After you have been seen by a medical provider, you will be either: o Tested for (COVID-19) and discharged home on quarantine except to seek medical care if symptoms worsen, and asked to  - Stay home and avoid contact with others until you get your results (4-5 days)  - Avoid travel on public transportation if possible (such as bus, train, or airplane) or o Sent to the Emergency Department by EMS for evaluation, COVID-19 testing, and possible admission depending on your condition and test results.  What to do if you are LOW RISK for COVID-19?  Reduce your risk of any infection by using the same precautions used for avoiding the common cold or flu:  Marland Kitchen Wash your hands often with soap and warm water for at least 20 seconds.  If soap and water are not readily available, use an alcohol-based hand sanitizer with at least 60% alcohol.  . If  coughing or sneezing, cover your mouth and nose by coughing or sneezing into the elbow areas of your shirt or coat, into a tissue or into your sleeve (not your hands). . Avoid shaking hands with others and consider head nods or verbal greetings only. . Avoid touching your eyes, nose, or mouth with unwashed hands.  . Avoid close contact with people who are sick. . Avoid places or events with large numbers of people in one location, like  concerts or sporting events. . Carefully consider travel plans you have or are making. . If you are planning any travel outside or inside the [place], visit the CDC's Travelers' Health webpage for the latest health notices. . If you have some symptoms but not all symptoms, continue to monitor at home and seek medical attention if your symptoms worsen. . If you are having a medical emergency, call 911.   [facility] / e-Visit: [email or web address]         [facility]: [facility]: [phone]                   [facility]: [phone]

## 2020-03-30 NOTE — Telephone Encounter (Signed)
Scheduled per los. Called and left msg.

## 2020-04-02 DIAGNOSIS — Z23 Encounter for immunization: Secondary | ICD-10-CM | POA: Diagnosis not present

## 2020-04-14 NOTE — Progress Notes (Signed)
Pharmacist Chemotherapy Monitoring - Follow Up Assessment    I verify that I have reviewed each item in the below checklist:  . Regimen for the patient is scheduled for the appropriate day and plan matches scheduled date. Marland Kitchen Appropriate non-routine labs are ordered dependent on drug ordered. . If applicable, additional medications reviewed and ordered per protocol based on lifetime cumulative doses and/or treatment regimen.   Plan for follow-up and/or issues identified: No . I-vent associated with next due treatment: No . MD and/or nursing notified: No  Mycah Formica K 04/14/2020 12:54 PM

## 2020-04-16 ENCOUNTER — Telehealth: Payer: Self-pay | Admitting: Physician Assistant

## 2020-04-16 ENCOUNTER — Ambulatory Visit (HOSPITAL_COMMUNITY): Admission: RE | Admit: 2020-04-16 | Payer: Medicare Other | Source: Ambulatory Visit

## 2020-04-16 NOTE — Telephone Encounter (Signed)
Received a message from radiology that this patient did not show up for her CT scan which was scheduled for today. I called the patient but the phone went straight to voicemail. I called and left a detailed message encouraging the patient to call radiology scheduling to reschedule her scan at her earliest convenience. The number to radiology was left on the voicemail. We will see her for follow up on 5/3.

## 2020-04-20 ENCOUNTER — Inpatient Hospital Stay: Payer: Medicare Other

## 2020-04-20 ENCOUNTER — Inpatient Hospital Stay: Payer: Medicare Other | Attending: Internal Medicine | Admitting: Internal Medicine

## 2020-04-20 ENCOUNTER — Encounter: Payer: Self-pay | Admitting: Internal Medicine

## 2020-04-20 ENCOUNTER — Other Ambulatory Visit: Payer: Self-pay

## 2020-04-20 VITALS — BP 106/89 | HR 99 | Temp 98.9°F | Resp 17 | Ht 66.0 in | Wt 204.1 lb

## 2020-04-20 DIAGNOSIS — C3431 Malignant neoplasm of lower lobe, right bronchus or lung: Secondary | ICD-10-CM | POA: Diagnosis not present

## 2020-04-20 DIAGNOSIS — Z79899 Other long term (current) drug therapy: Secondary | ICD-10-CM | POA: Diagnosis not present

## 2020-04-20 DIAGNOSIS — Z791 Long term (current) use of non-steroidal anti-inflammatories (NSAID): Secondary | ICD-10-CM | POA: Insufficient documentation

## 2020-04-20 DIAGNOSIS — Z95828 Presence of other vascular implants and grafts: Secondary | ICD-10-CM

## 2020-04-20 DIAGNOSIS — Z87442 Personal history of urinary calculi: Secondary | ICD-10-CM | POA: Insufficient documentation

## 2020-04-20 DIAGNOSIS — C3491 Malignant neoplasm of unspecified part of right bronchus or lung: Secondary | ICD-10-CM

## 2020-04-20 DIAGNOSIS — C771 Secondary and unspecified malignant neoplasm of intrathoracic lymph nodes: Secondary | ICD-10-CM | POA: Insufficient documentation

## 2020-04-20 DIAGNOSIS — R5383 Other fatigue: Secondary | ICD-10-CM

## 2020-04-20 DIAGNOSIS — Z7951 Long term (current) use of inhaled steroids: Secondary | ICD-10-CM | POA: Insufficient documentation

## 2020-04-20 DIAGNOSIS — Z5112 Encounter for antineoplastic immunotherapy: Secondary | ICD-10-CM | POA: Diagnosis not present

## 2020-04-20 DIAGNOSIS — Z923 Personal history of irradiation: Secondary | ICD-10-CM | POA: Insufficient documentation

## 2020-04-20 DIAGNOSIS — J449 Chronic obstructive pulmonary disease, unspecified: Secondary | ICD-10-CM | POA: Insufficient documentation

## 2020-04-20 LAB — CBC WITH DIFFERENTIAL (CANCER CENTER ONLY)
Abs Immature Granulocytes: 0.01 10*3/uL (ref 0.00–0.07)
Basophils Absolute: 0.1 10*3/uL (ref 0.0–0.1)
Basophils Relative: 1 %
Eosinophils Absolute: 0.2 10*3/uL (ref 0.0–0.5)
Eosinophils Relative: 3 %
HCT: 41.7 % (ref 36.0–46.0)
Hemoglobin: 13.8 g/dL (ref 12.0–15.0)
Immature Granulocytes: 0 %
Lymphocytes Relative: 18 %
Lymphs Abs: 1.2 10*3/uL (ref 0.7–4.0)
MCH: 30 pg (ref 26.0–34.0)
MCHC: 33.1 g/dL (ref 30.0–36.0)
MCV: 90.7 fL (ref 80.0–100.0)
Monocytes Absolute: 0.5 10*3/uL (ref 0.1–1.0)
Monocytes Relative: 7 %
Neutro Abs: 4.8 10*3/uL (ref 1.7–7.7)
Neutrophils Relative %: 71 %
Platelet Count: 275 10*3/uL (ref 150–400)
RBC: 4.6 MIL/uL (ref 3.87–5.11)
RDW: 13.2 % (ref 11.5–15.5)
WBC Count: 6.8 10*3/uL (ref 4.0–10.5)
nRBC: 0 % (ref 0.0–0.2)

## 2020-04-20 LAB — CMP (CANCER CENTER ONLY)
ALT: 8 U/L (ref 0–44)
AST: 12 U/L — ABNORMAL LOW (ref 15–41)
Albumin: 3.4 g/dL — ABNORMAL LOW (ref 3.5–5.0)
Alkaline Phosphatase: 105 U/L (ref 38–126)
Anion gap: 12 (ref 5–15)
BUN: 20 mg/dL (ref 8–23)
CO2: 26 mmol/L (ref 22–32)
Calcium: 9.3 mg/dL (ref 8.9–10.3)
Chloride: 106 mmol/L (ref 98–111)
Creatinine: 1.17 mg/dL — ABNORMAL HIGH (ref 0.44–1.00)
GFR, Est AFR Am: 56 mL/min — ABNORMAL LOW (ref >60)
GFR, Estimated: 48 mL/min — ABNORMAL LOW (ref >60)
Glucose, Bld: 102 mg/dL — ABNORMAL HIGH (ref 70–99)
Potassium: 3.2 mmol/L — ABNORMAL LOW (ref 3.5–5.1)
Sodium: 144 mmol/L (ref 135–145)
Total Bilirubin: 0.4 mg/dL (ref 0.3–1.2)
Total Protein: 6.8 g/dL (ref 6.5–8.1)

## 2020-04-20 LAB — TSH: TSH: 4.353 u[IU]/mL — ABNORMAL HIGH (ref 0.308–3.960)

## 2020-04-20 MED ORDER — SODIUM CHLORIDE 0.9% FLUSH
10.0000 mL | INTRAVENOUS | Status: DC | PRN
  Administered 2020-04-20: 10 mL
  Filled 2020-04-20: qty 10

## 2020-04-20 MED ORDER — SODIUM CHLORIDE 0.9 % IV SOLN
Freq: Once | INTRAVENOUS | Status: AC
  Filled 2020-04-20: qty 250

## 2020-04-20 MED ORDER — SODIUM CHLORIDE 0.9% FLUSH
10.0000 mL | Freq: Once | INTRAVENOUS | Status: AC
  Administered 2020-04-20: 10 mL
  Filled 2020-04-20: qty 10

## 2020-04-20 MED ORDER — SODIUM CHLORIDE 0.9 % IV SOLN
1200.0000 mg | Freq: Once | INTRAVENOUS | Status: AC
  Administered 2020-04-20: 1200 mg via INTRAVENOUS
  Filled 2020-04-20: qty 20

## 2020-04-20 MED ORDER — HEPARIN SOD (PORK) LOCK FLUSH 100 UNIT/ML IV SOLN
500.0000 [IU] | Freq: Once | INTRAVENOUS | Status: AC | PRN
  Administered 2020-04-20: 500 [IU]
  Filled 2020-04-20: qty 5

## 2020-04-20 NOTE — Progress Notes (Signed)
[facility] Telephone:([phone]) [phone]   Fax:(336) [place], [place] [address] [place] [address]  DIAGNOSIS: Recurrent small cell lung cancer initially diagnosed as Limited stage (T2b, N2, M0) small cell lung cancer diagnosed in November 2017 and presented with large right hilar mass with mediastinal invasion and a right lower lobe pulmonary nodule.  The patient had disease recurrence in the right lower lobe and mediastinal lymph nodes in February 2020.  PRIOR THERAPY:   1) Systemic chemotherapy with cisplatin 60 MG/M2 on day 1 and etoposide 120 MG/M2 on days 1, 2 and 3 concurrent with radiation status post 6 cycles.  Last dose was giving March 06, 2017. 2) status post prophylactic cranial irradiation.  CURRENT THERAPY: Systemic chemotherapy with carboplatin for AUC of 5 on day 1, etoposide.  100 mg/M2 on days 1, 2 and 3 as well as Tecentriq 1200 mg IV with Neulasta support every 3 weeks.  First dose March 05, 2019.  Status post 15 cycles.  Starting from cycle 5, the patient will be treated with single agent Tecentriq 1200 mg IV every 3 weeks.  First dose May 28, 2019.  INTERVAL HISTORY: Tammy Bowers 69 y.o. female returns to the clinic today for follow-up visit.  The patient is feeling fine today with no concerning complaints.  She had 1 day of diarrhea last week and she could not get her imaging studies done that day.  She denied having any current chest pain, shortness of breath but continues to have cough with no hemoptysis.  She denied having any fever or chills.  She has no nausea, vomiting, diarrhea or constipation.  She received the 2 doses of Covid vaccine last dose was on April 02, 2020.  She is here today for evaluation before starting cycle #16.  The patient mentions that she is moving to [place] later this month and she will stay there until October.  MEDICAL HISTORY: Past Medical History:  Diagnosis Date  .  Adenopathy 10/10/2016   PERICARINAL  . Anemia   . Arthritis   . Asthma   . Cancer ([facility])    lung cancer Small Cell  . COPD (chronic obstructive pulmonary disease) ([facility])   . Dyspnea   . Headache    migraines  . Hilar mass 10/10/2016   RIGHT  . History of kidney stones   . Insomnia   . Lung nodules 10/10/2016   RIGHT LOWER LOBE  . Mass of both adrenal glands ([facility]) 10/10/2016  . Mood swing   . Pneumonia   . Spinal headache   . Substance abuse ([facility])    TOBACCO  . UNSPECIFIED INFECTION OF BONE ANKLE AND FOOT 10/22/2009   Annotation: left ankle Qualifier: Diagnosis of  By: Patsy Baltimore RN, Denise      ALLERGIES:  is allergic to lidocaine.  MEDICATIONS:  Current Outpatient Medications  Medication Sig Dispense Refill  . albuterol (PROVENTIL HFA;VENTOLIN HFA) 108 (90 BASE) MCG/ACT inhaler Inhale 2 puffs into the lungs every 4 (four) hours as needed for wheezing.    . Cyclobenzaprine HCl (FLEXERIL PO) Take 5 mg by mouth 3 (three) times daily as needed (muscle spasms).     . Fluticasone-Salmeterol (ADVAIR) 500-50 MCG/DOSE AEPB Inhale 1 puff into the lungs 2 (two) times daily.    Marland Kitchen ibuprofen (ADVIL) 800 MG tablet Take 800 mg by mouth every 8 (eight) hours as needed.    Marland Kitchen levothyroxine (SYNTHROID) 75 MCG tablet  TAKE 1 TABLET BY MOUTH EVERY DAY 90 tablet 0  . LORazepam (ATIVAN) 0.5 MG tablet Take 1 tablet (0.5 mg total) by mouth at bedtime. 20 tablet 0  . Multiple Vitamin (MULTIVITAMIN WITH MINERALS) TABS tablet Take 1 tablet by mouth daily.     . prochlorperazine (COMPAZINE) 10 MG tablet TAKE 1 TABLET(10 MG) BY MOUTH EVERY 6 HOURS AS NEEDED FOR NAUSEA OR VOMITING 30 tablet 0  . diphenoxylate-atropine (LOMOTIL) 2.5-0.025 MG tablet Take 1 tablet by mouth 4 (four) times daily as needed for diarrhea or loose stools. (Patient not taking: Reported on 04/20/2020) 30 tablet 0   No current facility-administered medications for this visit.    SURGICAL HISTORY:  Past Surgical History:  Procedure  Laterality Date  . ANKLE SURGERY     hardware removal, and initial ankle surgery  . APPENDECTOMY    . IR IMAGING GUIDED PORT INSERTION  04/12/2019  . LUNG BIOPSY N/A 10/19/2016   Procedure: LUNG BIOPSY;  Surgeon: Grace Isaac, MD;  Location: [facility];  Service: Thoracic;  Laterality: N/A;  . LUNG BIOPSY N/A 02/20/2019   Procedure: LUNG BIOPSY;  Surgeon: Grace Isaac, MD;  Location: [facility];  Service: Thoracic;  Laterality: N/A;  . TONSILLECTOMY    . VIDEO BRONCHOSCOPY WITH ENDOBRONCHIAL ULTRASOUND N/A 10/19/2016   Procedure: VIDEO BRONCHOSCOPY WITH ENDOBRONCHIAL ULTRASOUND;  Surgeon: Grace Isaac, MD;  Location: [facility];  Service: Thoracic;  Laterality: N/A;  . VIDEO BRONCHOSCOPY WITH ENDOBRONCHIAL ULTRASOUND N/A 02/20/2019   Procedure: VIDEO BRONCHOSCOPY WITH ENDOBRONCHIAL ULTRASOUND;  Surgeon: Grace Isaac, MD;  Location: [facility];  Service: Thoracic;  Laterality: N/A;    REVIEW OF SYSTEMS:  A comprehensive review of systems was negative except for: Constitutional: positive for fatigue Respiratory: positive for cough   PHYSICAL EXAMINATION: General appearance: alert, cooperative, fatigued and no distress Head: Normocephalic, without obvious abnormality, atraumatic Neck: no adenopathy, no JVD, supple, symmetrical, trachea midline and thyroid not enlarged, symmetric, no tenderness/mass/nodules Lymph nodes: Cervical, supraclavicular, and axillary nodes normal. Resp: clear to auscultation bilaterally Back: symmetric, no curvature. ROM normal. No CVA tenderness. Cardio: regular rate and rhythm, S1, S2 normal, no murmur, click, rub or gallop GI: soft, non-tender; bowel sounds normal; no masses,  no organomegaly Extremities: extremities normal, atraumatic, no cyanosis or edema  ECOG PERFORMANCE STATUS: 1 - Symptomatic but completely ambulatory  Blood pressure 106/89, pulse 99, temperature 98.9 F (37.2 C), temperature source Temporal, resp. rate 17, height 5\' 6"  (1.676 m), weight 204  lb 1.6 oz (92.6 kg), SpO2 98 %.  LABORATORY DATA: Lab Results  Component Value Date   WBC 6.8 04/20/2020   HGB 13.8 04/20/2020   HCT 41.7 04/20/2020   MCV 90.7 04/20/2020   PLT 275 04/20/2020      Chemistry      Component Value Date/Time   NA 140 03/30/2020 1021   NA 139 11/24/2017 0913   K 3.8 03/30/2020 1021   K 3.8 11/24/2017 0913   CL 106 03/30/2020 1021   CO2 26 03/30/2020 1021   CO2 24 11/24/2017 0913   BUN 17 03/30/2020 1021   BUN 22.1 11/24/2017 0913   CREATININE 1.19 (H) 03/30/2020 1021   CREATININE 1.2 (H) 11/24/2017 0913      Component Value Date/Time   CALCIUM 9.1 03/30/2020 1021   CALCIUM 9.7 11/24/2017 0913   ALKPHOS 111 03/30/2020 1021   ALKPHOS 99 11/24/2017 0913   AST 13 (L) 03/30/2020 1021   AST 15 11/24/2017 0913  ALT 9 03/30/2020 1021   ALT 7 11/24/2017 0913   BILITOT 0.4 03/30/2020 1021   BILITOT 0.37 11/24/2017 0913       RADIOGRAPHIC STUDIES: No results found.  ASSESSMENT AND PLAN:  This is a very pleasant 69 years old white female with recurrent small cell lung cancer that was initially diagnosed as limited stage disease status post a course of systemic chemotherapy with cisplatin and etoposide for 6 cycles concurrent with radiation. She tolerated her treatment well except for fatigue. She is also status post prophylactic cranial irradiation. The patient has been in observation since March 2018. She was found on recent imaging studies to have evidence for disease recurrence with right lower lobe pulmonary nodules in addition to mediastinal lymphadenopathy. Repeat bronchoscopy with endobronchial ultrasound and biopsy confirmed recurrence of his small cell lung cancer. The patient was started on systemic chemotherapy with carboplatin, etoposide and Tecentriq status post 15 cycles.  She has partial response after cycle #4 and starting from cycle #5 the patient is currently on maintenance treatment with Tecentriq.  The patient continues to  tolerate this treatment well with no concerning adverse effects. I recommended for her to get her CT imaging done before she leaves to [place]. She will be out of town until October 2021. We will arrange for the patient follow-up visit after her return from [place] at that time. She was advised to call immediately if she has any concerning symptoms in the interval. The patient voices understanding of current disease status and treatment options and is in agreement with the current care plan.  All questions were answered. The patient knows to call the clinic with any problems, questions or concerns. We can certainly see the patient much sooner if necessary.  Disclaimer: This note was dictated with voice recognition software. Similar sounding words can inadvertently be transcribed and may not be corrected upon review.

## 2020-04-20 NOTE — Patient Instructions (Signed)
[facility] Discharge Instructions for Patients Receiving Chemotherapy  Today you received the following Immunotherapy agent: Atezolizumab(Tecentriq)  To help prevent nausea and vomiting after your treatment, we encourage you to take your nausea medication as directed by your MD.   If you develop nausea and vomiting that is not controlled by your nausea medication, call the clinic.   BELOW ARE SYMPTOMS THAT SHOULD BE REPORTED IMMEDIATELY:  *FEVER GREATER THAN 100.5 F  *CHILLS WITH OR WITHOUT FEVER  NAUSEA AND VOMITING THAT IS NOT CONTROLLED WITH YOUR NAUSEA MEDICATION  *UNUSUAL SHORTNESS OF BREATH  *UNUSUAL BRUISING OR BLEEDING  TENDERNESS IN MOUTH AND THROAT WITH OR WITHOUT PRESENCE OF ULCERS  *URINARY PROBLEMS  *BOWEL PROBLEMS  UNUSUAL RASH Items with * indicate a potential emergency and should be followed up as soon as possible.  Feel free to call the clinic should you have any questions or concerns. The clinic phone number is [phone].  Please show the [facility] at check-in to the Emergency Department and triage nurse.  Coronavirus (COVID-19) Are you at risk?  Are you at risk for the Coronavirus (COVID-19)?  To be considered HIGH RISK for Coronavirus (COVID-19), you have to meet the following criteria:  . Traveled to [place], [place], [place], [place] or [place]; or in the [place] to [place], [place], [place], or [place]; and have fever, cough, and shortness of breath within the last 2 weeks of travel OR . Been in close contact with a person diagnosed with COVID-19 within the last 2 weeks and have fever, cough, and shortness of breath . IF YOU DO NOT MEET THESE CRITERIA, YOU ARE CONSIDERED LOW RISK FOR COVID-19.  What to do if you are HIGH RISK for COVID-19?  Marland Kitchen If you are having a medical emergency, call 911. . Seek medical care right away. Before you go to a doctor's office, urgent care or emergency department, call ahead and  tell them about your recent travel, contact with someone diagnosed with COVID-19, and your symptoms. You should receive instructions from your physician's office regarding next steps of care.  . When you arrive at healthcare provider, tell the healthcare staff immediately you have returned from visiting [place], [place], [place], [place] or [place]; or traveled in the [place] to [place], [place], [place], or [place]; in the last two weeks or you have been in close contact with a person diagnosed with COVID-19 in the last 2 weeks.   . Tell the health care staff about your symptoms: fever, cough and shortness of breath. . After you have been seen by a medical provider, you will be either: o Tested for (COVID-19) and discharged home on quarantine except to seek medical care if symptoms worsen, and asked to  - Stay home and avoid contact with others until you get your results (4-5 days)  - Avoid travel on public transportation if possible (such as bus, train, or airplane) or o Sent to the Emergency Department by EMS for evaluation, COVID-19 testing, and possible admission depending on your condition and test results.  What to do if you are LOW RISK for COVID-19?  Reduce your risk of any infection by using the same precautions used for avoiding the common cold or flu:  Marland Kitchen Wash your hands often with soap and warm water for at least 20 seconds.  If soap and water are not readily available, use an alcohol-based hand sanitizer with at least 60% alcohol.  . If  coughing or sneezing, cover your mouth and nose by coughing or sneezing into the elbow areas of your shirt or coat, into a tissue or into your sleeve (not your hands). . Avoid shaking hands with others and consider head nods or verbal greetings only. . Avoid touching your eyes, nose, or mouth with unwashed hands.  . Avoid close contact with people who are sick. . Avoid places or events with large numbers of people in one location, like  concerts or sporting events. . Carefully consider travel plans you have or are making. . If you are planning any travel outside or inside the [place], visit the CDC's Travelers' Health webpage for the latest health notices. . If you have some symptoms but not all symptoms, continue to monitor at home and seek medical attention if your symptoms worsen. . If you are having a medical emergency, call 911.   Progress / e-Visit: [email or web address]         [facility]: [facility]: [phone]                   [facility]: [phone]

## 2020-04-30 ENCOUNTER — Other Ambulatory Visit: Payer: Self-pay

## 2020-04-30 ENCOUNTER — Ambulatory Visit (HOSPITAL_COMMUNITY)
Admission: RE | Admit: 2020-04-30 | Discharge: 2020-04-30 | Disposition: A | Discharge status: Home / Self Care | Payer: Medicare Other | Source: Ambulatory Visit | Attending: Physician Assistant | Admitting: Physician Assistant

## 2020-04-30 ENCOUNTER — Encounter (HOSPITAL_COMMUNITY): Payer: Self-pay

## 2020-04-30 DIAGNOSIS — C3491 Malignant neoplasm of unspecified part of right bronchus or lung: Secondary | ICD-10-CM | POA: Insufficient documentation

## 2020-04-30 DIAGNOSIS — D3501 Benign neoplasm of right adrenal gland: Secondary | ICD-10-CM | POA: Diagnosis not present

## 2020-04-30 DIAGNOSIS — C3490 Malignant neoplasm of unspecified part of unspecified bronchus or lung: Secondary | ICD-10-CM | POA: Diagnosis not present

## 2020-04-30 MED ORDER — SODIUM CHLORIDE (PF) 0.9 % IJ SOLN
INTRAMUSCULAR | Status: AC
  Filled 2020-04-30: qty 50

## 2020-04-30 MED ORDER — HEPARIN SOD (PORK) LOCK FLUSH 100 UNIT/ML IV SOLN: 500.0000 [IU] | Freq: Once | INTRAVENOUS | Status: AC

## 2020-04-30 MED ORDER — HEPARIN SOD (PORK) LOCK FLUSH 100 UNIT/ML IV SOLN
INTRAVENOUS | Status: AC
  Administered 2020-04-30: 500 [IU] via INTRAVENOUS
  Filled 2020-04-30: qty 5

## 2020-04-30 MED ORDER — IOHEXOL 300 MG/ML  SOLN
100.0000 mL | Freq: Once | INTRAMUSCULAR | Status: AC | PRN
  Administered 2020-04-30: 100 mL via INTRAVENOUS

## 2020-05-05 DIAGNOSIS — E039 Hypothyroidism, unspecified: Secondary | ICD-10-CM | POA: Diagnosis not present

## 2020-05-05 DIAGNOSIS — F17200 Nicotine dependence, unspecified, uncomplicated: Secondary | ICD-10-CM | POA: Diagnosis not present

## 2020-05-05 DIAGNOSIS — C3490 Malignant neoplasm of unspecified part of unspecified bronchus or lung: Secondary | ICD-10-CM | POA: Diagnosis not present

## 2020-05-05 DIAGNOSIS — F5101 Primary insomnia: Secondary | ICD-10-CM | POA: Diagnosis not present

## 2020-05-05 DIAGNOSIS — J449 Chronic obstructive pulmonary disease, unspecified: Secondary | ICD-10-CM | POA: Diagnosis not present

## 2020-05-11 ENCOUNTER — Other Ambulatory Visit: Payer: Medicaid Other

## 2020-05-11 ENCOUNTER — Ambulatory Visit: Payer: Medicaid Other | Admitting: Physician Assistant

## 2020-05-11 ENCOUNTER — Ambulatory Visit: Payer: Medicaid Other

## 2020-05-25 DIAGNOSIS — T451X5A Adverse effect of antineoplastic and immunosuppressive drugs, initial encounter: Secondary | ICD-10-CM | POA: Diagnosis not present

## 2020-05-25 DIAGNOSIS — R112 Nausea with vomiting, unspecified: Secondary | ICD-10-CM | POA: Diagnosis not present

## 2020-05-25 DIAGNOSIS — Z5111 Encounter for antineoplastic chemotherapy: Secondary | ICD-10-CM | POA: Diagnosis not present

## 2020-05-25 DIAGNOSIS — C3490 Malignant neoplasm of unspecified part of unspecified bronchus or lung: Secondary | ICD-10-CM | POA: Diagnosis not present

## 2020-06-01 ENCOUNTER — Ambulatory Visit: Payer: Medicaid Other

## 2020-06-01 ENCOUNTER — Ambulatory Visit: Payer: Medicaid Other | Admitting: Internal Medicine

## 2020-06-01 ENCOUNTER — Other Ambulatory Visit: Payer: Medicaid Other

## 2020-06-16 ENCOUNTER — Other Ambulatory Visit: Payer: Self-pay | Admitting: Internal Medicine

## 2020-06-16 DIAGNOSIS — R5382 Chronic fatigue, unspecified: Secondary | ICD-10-CM

## 2020-07-07 DIAGNOSIS — W19XXXA Unspecified fall, initial encounter: Secondary | ICD-10-CM | POA: Diagnosis not present

## 2020-07-07 DIAGNOSIS — R110 Nausea: Secondary | ICD-10-CM | POA: Diagnosis not present

## 2020-07-07 DIAGNOSIS — I959 Hypotension, unspecified: Secondary | ICD-10-CM | POA: Diagnosis not present

## 2020-07-07 DIAGNOSIS — F17210 Nicotine dependence, cigarettes, uncomplicated: Secondary | ICD-10-CM | POA: Diagnosis not present

## 2020-07-07 DIAGNOSIS — I6523 Occlusion and stenosis of bilateral carotid arteries: Secondary | ICD-10-CM | POA: Diagnosis not present

## 2020-07-07 DIAGNOSIS — R410 Disorientation, unspecified: Secondary | ICD-10-CM | POA: Diagnosis not present

## 2020-07-07 DIAGNOSIS — M25511 Pain in right shoulder: Secondary | ICD-10-CM | POA: Diagnosis not present

## 2020-07-07 DIAGNOSIS — J449 Chronic obstructive pulmonary disease, unspecified: Secondary | ICD-10-CM | POA: Diagnosis not present

## 2020-07-07 DIAGNOSIS — R4182 Altered mental status, unspecified: Secondary | ICD-10-CM | POA: Diagnosis not present

## 2020-07-10 ENCOUNTER — Telehealth: Payer: Self-pay | Admitting: Internal Medicine

## 2020-07-10 ENCOUNTER — Telehealth: Payer: Self-pay | Admitting: *Deleted

## 2020-07-10 NOTE — Progress Notes (Deleted)
Error

## 2020-07-10 NOTE — Telephone Encounter (Signed)
Patient is back in town from [place]. Needs to be scheduled for treatment ASAP.  Message to scheduler

## 2020-07-10 NOTE — Telephone Encounter (Signed)
Scheduled appt per 7/23 sch msg - pt is aware of appt date and time

## 2020-07-10 NOTE — Telephone Encounter (Signed)
-----   Message from Ardeen Garland, RN sent at 07/09/2020  3:56 PM EDT ----- Can you call her tomorrow? ----- Message ----- From: Nicholaus Corolla Sent: 07/09/2020  10:22 AM EDT To: Ardeen Garland, RN  Scheduling Message Entered by Guy Sandifer on 07/09/2020 at 9:50 AM Priority: Routine <No visit type provided> Department: [facility] Provider: [facility] Scheduling Notes: schedule appt   Pt calling in for an appt - not sure what is needed - looks like last LOS says she wouldn't be back until Oct from being out of town

## 2020-07-13 ENCOUNTER — Inpatient Hospital Stay: Payer: Medicare Other

## 2020-07-13 ENCOUNTER — Inpatient Hospital Stay: Payer: Medicare Other | Attending: Internal Medicine | Admitting: Physician Assistant

## 2020-07-13 ENCOUNTER — Telehealth: Payer: Self-pay | Admitting: Physician Assistant

## 2020-07-13 DIAGNOSIS — Z7951 Long term (current) use of inhaled steroids: Secondary | ICD-10-CM | POA: Insufficient documentation

## 2020-07-13 DIAGNOSIS — M1990 Unspecified osteoarthritis, unspecified site: Secondary | ICD-10-CM | POA: Insufficient documentation

## 2020-07-13 DIAGNOSIS — F17210 Nicotine dependence, cigarettes, uncomplicated: Secondary | ICD-10-CM | POA: Insufficient documentation

## 2020-07-13 DIAGNOSIS — J449 Chronic obstructive pulmonary disease, unspecified: Secondary | ICD-10-CM | POA: Insufficient documentation

## 2020-07-13 DIAGNOSIS — Z79899 Other long term (current) drug therapy: Secondary | ICD-10-CM | POA: Insufficient documentation

## 2020-07-13 DIAGNOSIS — Z791 Long term (current) use of non-steroidal anti-inflammatories (NSAID): Secondary | ICD-10-CM | POA: Insufficient documentation

## 2020-07-13 DIAGNOSIS — R5383 Other fatigue: Secondary | ICD-10-CM | POA: Insufficient documentation

## 2020-07-13 DIAGNOSIS — C771 Secondary and unspecified malignant neoplasm of intrathoracic lymph nodes: Secondary | ICD-10-CM | POA: Insufficient documentation

## 2020-07-13 DIAGNOSIS — Z5112 Encounter for antineoplastic immunotherapy: Secondary | ICD-10-CM | POA: Insufficient documentation

## 2020-07-13 NOTE — Telephone Encounter (Signed)
The patient did not show up for her appointment today. I left a voicemail instructing her to call back to reschedule.

## 2020-07-14 ENCOUNTER — Telehealth: Payer: Self-pay | Admitting: Internal Medicine

## 2020-07-14 NOTE — Telephone Encounter (Signed)
Scheduled appt per 7/27 schmsg - unable to reach pt . Left message with appt date and time - and sent message to Cassie to let her know I was unable to reach pt.

## 2020-07-15 ENCOUNTER — Telehealth: Payer: Self-pay

## 2020-07-15 NOTE — Telephone Encounter (Signed)
TC to pt per Cassie PA to see if she will be back from [place] tomorrow for her appointment? Pt stated that she would be back tomorrow for appointments. I went over with her the time of her appointments tomorrow. I let her know that her she has LAB@1 :45p, FLUSH@2 :00p, CASSIE@2 :30p, and INF@3 :00p. Patient verbalized understanding.

## 2020-07-15 NOTE — Progress Notes (Addendum)
[facility] OFFICE PROGRESS NOTE  Koirala, Dibas, MD [address] [address]  DIAGNOSIS: Recurrent small cell lung cancer initially diagnosed as Limited stage (T2b, N2, M0) small cell lung cancer diagnosed in November 2017 and presented with large right hilar mass with mediastinal invasion and a right lower lobe pulmonary nodule. The patient had disease recurrence in the right lower lobe and mediastinal lymph nodes in February 2020.  PRIOR THERAPY: 1) Systemic chemotherapy with cisplatin 60 MG/M2 on day 1 and etoposide 120 MG/M2 on days 1, 2 and 3 concurrent with radiation status post 6 cycles. Last dose was giving March 06, 2017. 2) status post prophylactic cranial irradiation.  CURRENT THERAPY: Systemic chemotherapy with carboplatin for AUC of 5 on day 1, etoposide. 100 mg/M2 on days 1, 2 and 3 as well as Tecentriq 1200 mg IV with Neulasta support every 3 weeks. First dose March 05, 2019. Starting from cycle 5, the patient will be treated with single agent Tecentriq 1200 mg IV every 3 weeks. First dose May 28, 2019. Status post 16 cycles of tecentriq performed at our clinic.   INTERVAL HISTORY: Tammy Bowers 69 y.o. female returns to the clinic today for a follow-up visit.  From May 2021 -July 2021, the patient was in [place] receiving her cancer treatment under the care of oncologist, Dr. Lockie Pares, at [facility] in [place].  The patient's husband works as a Biomedical scientist and they relocated to [place] during the summer months for his job.  The patiently return to the area this week.   It appears that the patient presented to the emergency room for the chief complaint of altered mental status on 07/07/2020 in [place].  No acute abnormalities were found on CTA of the head to explain her symptoms. She reported left sided weakness and slurred speech. She is not having these symptoms at this time.  Today, the patient is feeling fairly  well except for fatigue. She denies any fever, chills, night sweats, or weight loss.  She denies any hemoptysis.  She reports her baseline shortness of breath with exertion and cough. She also has some discomfort in her right breast/neck when she coughs which she believes is due to her port-a-cath. She continues to smoke approximately 10 cigarettes or less per day.  She denies any vomiting, diarrhea, or constipation.  She had some mild nausea recently which she thought was related to car sickness from her recent travel. She denies any headache or visual changes.  She denies any rashes or skin changes.  She is here today for evaluation, repeat blood work, and consideration of resuming her treatment with Tecentriq.  MEDICAL HISTORY: Past Medical History:  Diagnosis Date  . Adenopathy 10/10/2016   PERICARINAL  . Anemia   . Arthritis   . Asthma   . Cancer ([place])    lung cancer Small Cell  . COPD (chronic obstructive pulmonary disease) ([place])   . Dyspnea   . Headache    migraines  . Hilar mass 10/10/2016   RIGHT  . History of kidney stones   . Insomnia   . Lung nodules 10/10/2016   RIGHT LOWER LOBE  . Mass of both adrenal glands ([place]) 10/10/2016  . Mood swing   . Pneumonia   . Spinal headache   . Substance abuse ([place])    TOBACCO  . UNSPECIFIED INFECTION OF BONE ANKLE AND FOOT 10/22/2009   Annotation: left ankle Qualifier: Diagnosis of  By: Patsy [place]  RN, Langley Gauss      ALLERGIES:  is allergic to lidocaine.  MEDICATIONS:  Current Outpatient Medications  Medication Sig Dispense Refill  . albuterol (PROVENTIL HFA;VENTOLIN HFA) 108 (90 BASE) MCG/ACT inhaler Inhale 2 puffs into the lungs every 4 (four) hours as needed for wheezing.    . Cyclobenzaprine HCl (FLEXERIL PO) Take 5 mg by mouth 3 (three) times daily as needed (muscle spasms).     . diphenoxylate-atropine (LOMOTIL) 2.5-0.025 MG tablet Take 1 tablet by mouth 4 (four) times daily as needed for diarrhea or loose stools. (Patient not  taking: Reported on 04/20/2020) 30 tablet 0  . Fluticasone-Salmeterol (ADVAIR) 500-50 MCG/DOSE AEPB Inhale 1 puff into the lungs 2 (two) times daily.    Marland Kitchen ibuprofen (ADVIL) 800 MG tablet Take 800 mg by mouth every 8 (eight) hours as needed.    Marland Kitchen levothyroxine (SYNTHROID) 75 MCG tablet TAKE 1 TABLET BY MOUTH EVERY DAY 90 tablet 0  . LORazepam (ATIVAN) 0.5 MG tablet Take 1 tablet (0.5 mg total) by mouth at bedtime. 20 tablet 0  . Multiple Vitamin (MULTIVITAMIN WITH MINERALS) TABS tablet Take 1 tablet by mouth daily.     . prochlorperazine (COMPAZINE) 10 MG tablet TAKE 1 TABLET(10 MG) BY MOUTH EVERY 6 HOURS AS NEEDED FOR NAUSEA OR VOMITING 30 tablet 0   No current facility-administered medications for this visit.    SURGICAL HISTORY:  Past Surgical History:  Procedure Laterality Date  . ANKLE SURGERY     hardware removal, and initial ankle surgery  . APPENDECTOMY    . IR IMAGING GUIDED PORT INSERTION  04/12/2019  . LUNG BIOPSY N/A 10/19/2016   Procedure: LUNG BIOPSY;  Surgeon: Grace Isaac, MD;  Location: [facility];  Service: Thoracic;  Laterality: N/A;  . LUNG BIOPSY N/A 02/20/2019   Procedure: LUNG BIOPSY;  Surgeon: Grace Isaac, MD;  Location: [facility];  Service: Thoracic;  Laterality: N/A;  . TONSILLECTOMY    . VIDEO BRONCHOSCOPY WITH ENDOBRONCHIAL ULTRASOUND N/A 10/19/2016   Procedure: VIDEO BRONCHOSCOPY WITH ENDOBRONCHIAL ULTRASOUND;  Surgeon: Grace Isaac, MD;  Location: [facility];  Service: Thoracic;  Laterality: N/A;  . VIDEO BRONCHOSCOPY WITH ENDOBRONCHIAL ULTRASOUND N/A 02/20/2019   Procedure: VIDEO BRONCHOSCOPY WITH ENDOBRONCHIAL ULTRASOUND;  Surgeon: Grace Isaac, MD;  Location: [facility];  Service: Thoracic;  Laterality: N/A;    REVIEW OF SYSTEMS:   Review of Systems  Constitutional: Positive for fatigue and decreased appetite. Negative for chills, fever and unexpected weight change.  HENT: Negative for mouth sores, nosebleeds, sore throat and trouble swallowing.  No  thrush. Eyes: Negative for eye problems and icterus.  Respiratory: Positive for baseline cough and shortness of breath. Negative for hemoptysis and wheezing.   Cardiovascular: Positive for intermittent right breast pain with coughing (none at this time). Negative for leg swelling.  Gastrointestinal: Positive for mild nausea. Negative for abdominal pain, constipation, diarrhea, and vomiting.  Genitourinary: Negative for bladder incontinence, difficulty urinating, dysuria, frequency and hematuria.   Musculoskeletal: Negative for back pain, gait problem, neck pain and neck stiffness.  Skin: Negative for itching and rash.  Neurological: Negative for dizziness, extremity weakness, gait problem, headaches, light-headedness and seizures.  Hematological: Negative for adenopathy. Does not bruise/bleed easily.  Psychiatric/Behavioral: Negative for confusion, depression and sleep disturbance. The patient is not nervous/anxious.     PHYSICAL EXAMINATION:  There were no vitals taken for this visit.  ECOG PERFORMANCE STATUS: 1 - Symptomatic but completely ambulatory  Physical Exam  Constitutional: Oriented to person,  place, and time and well-developed, well-nourished, and in no distress.  HENT:  Head: Normocephalic and atraumatic.  Mouth/Throat: Oropharynx is clear and moist. No oropharyngeal exudate.  Eyes: Conjunctivae are normal. Right eye exhibits no discharge. Left eye exhibits no discharge. No scleral icterus.  Neck: Normal range of motion. Neck supple.  Cardiovascular: Normal rate, regular rhythm, normal heart sounds and intact distal pulses.   Pulmonary/Chest: Effort normal. Some wheezing noted bilaterally. No respiratory distress.  No rales.  Abdominal: Soft. Bowel sounds are normal. Exhibits no distension and no mass. There is no tenderness.  Musculoskeletal: Normal range of motion. Exhibits no edema.  Lymphadenopathy:    No cervical adenopathy.  Neurological: Alert and oriented to person,  place, and time. Exhibits normal muscle tone. Gait normal. Coordination normal.  Skin: Skin is warm and dry. No rash noted. Not diaphoretic. No erythema. No pallor.  Psychiatric: Mood, memory and judgment normal.  Vitals reviewed.  LABORATORY DATA: Lab Results  Component Value Date   WBC 6.8 04/20/2020   HGB 13.8 04/20/2020   HCT 41.7 04/20/2020   MCV 90.7 04/20/2020   PLT 275 04/20/2020      Chemistry      Component Value Date/Time   NA 144 04/20/2020 0911   NA 139 11/24/2017 0913   K 3.2 (L) 04/20/2020 0911   K 3.8 11/24/2017 0913   CL 106 04/20/2020 0911   CO2 26 04/20/2020 0911   CO2 24 11/24/2017 0913   BUN 20 04/20/2020 0911   BUN 22.1 11/24/2017 0913   CREATININE 1.17 (H) 04/20/2020 0911   CREATININE 1.2 (H) 11/24/2017 0913      Component Value Date/Time   CALCIUM 9.3 04/20/2020 0911   CALCIUM 9.7 11/24/2017 0913   ALKPHOS 105 04/20/2020 0911   ALKPHOS 99 11/24/2017 0913   AST 12 (L) 04/20/2020 0911   AST 15 11/24/2017 0913   ALT 8 04/20/2020 0911   ALT 7 11/24/2017 0913   BILITOT 0.4 04/20/2020 0911   BILITOT 0.37 11/24/2017 0913       RADIOGRAPHIC STUDIES:  No results found.   ASSESSMENT/PLAN:  This is a very pleasant 69 year old Caucasian female with recurrent small cell lung cancer.She was initially diagnosed as limited stage (T2b, N2, M0)in 2017. She presented with alarge right hilar mass with mediastinal invasion and a right lower lobe pulmonary nodule.She is status post a course of systemic chemotherapy with cisplatin and etoposide for 6 cycles with concurrent radiation. She tolerated treatment well except for fatigue.  She also received prophylactic cranial irradiation. She had been on observation since March 2018.  Recent imaging studies from February 2020 showed evidence of disease recurrence within right lower lobe pulmonary nodules in addition to mediastinal lymphadenopathy. A repeat bronchoscopy with endobronchial ultrasound and  biopsy confirmed recurrence of small cell lung cancer.  She is currently undergoing systemic chemotherapy with carboplatin, etoposide, and Tecentriq.She is status post16cycles.Starting from cycle #5 the patient has beenreceiving single agent maintenance Tecentriq. She tolerated treatment well except for itching and fatigue.  The patient was last seen at our clinic in May 2021.  She had been in [place] receiving her treatment.  She recently returned and is here to resume her treatment. I confirmed with the patient that her last treatment with Tecentriq was 4 weeks ago. She states she was supposed to receive treatment last week but was unable to receive it due to moving.   Labs were reviewed.  Recommend that she was cycle #17 today scheduled.  I will  arrange for restaging CT scan of her chest, abdomen, and pelvis prior to her next cycle of treatment.  We will see her back for follow-up visit in 3 weeks for evaluation and to review her scan results before starting cycle #18.  We reviewed concerning symptoms for a stroke including extremity weakness, slurred speech, facial droop, etc. Discussed if she has these symptoms that she should go to the emergency room immediately to be evaluated. No etiology was seen on recent imaging studies to explain her symptoms when seen in the ER in [place]. Symptoms have resolved at this time.   The patient was advised to call immediately if she has any concerning symptoms in the interval. The patient voices understanding of current disease status and treatment options and is in agreement with the current care plan. All questions were answered. The patient knows to call the clinic with any problems, questions or concerns. We can certainly see the patient much sooner if necessary    No orders of the defined types were placed in this encounter.    Michel Hendon L Kaycie Pegues, PA-C 07/15/20

## 2020-07-16 ENCOUNTER — Inpatient Hospital Stay: Payer: Medicare Other

## 2020-07-16 ENCOUNTER — Inpatient Hospital Stay (HOSPITAL_BASED_OUTPATIENT_CLINIC_OR_DEPARTMENT_OTHER): Payer: Medicare Other | Admitting: Physician Assistant

## 2020-07-16 ENCOUNTER — Other Ambulatory Visit: Payer: Self-pay

## 2020-07-16 VITALS — BP 116/78 | HR 92 | Temp 98.1°F | Resp 18 | Ht 66.0 in | Wt 209.7 lb

## 2020-07-16 DIAGNOSIS — C3491 Malignant neoplasm of unspecified part of right bronchus or lung: Secondary | ICD-10-CM

## 2020-07-16 DIAGNOSIS — J449 Chronic obstructive pulmonary disease, unspecified: Secondary | ICD-10-CM | POA: Diagnosis not present

## 2020-07-16 DIAGNOSIS — Z5112 Encounter for antineoplastic immunotherapy: Secondary | ICD-10-CM | POA: Diagnosis not present

## 2020-07-16 DIAGNOSIS — Z791 Long term (current) use of non-steroidal anti-inflammatories (NSAID): Secondary | ICD-10-CM | POA: Diagnosis not present

## 2020-07-16 DIAGNOSIS — Z95828 Presence of other vascular implants and grafts: Secondary | ICD-10-CM

## 2020-07-16 DIAGNOSIS — M1990 Unspecified osteoarthritis, unspecified site: Secondary | ICD-10-CM | POA: Diagnosis not present

## 2020-07-16 DIAGNOSIS — R5383 Other fatigue: Secondary | ICD-10-CM | POA: Diagnosis not present

## 2020-07-16 DIAGNOSIS — F17210 Nicotine dependence, cigarettes, uncomplicated: Secondary | ICD-10-CM | POA: Diagnosis not present

## 2020-07-16 DIAGNOSIS — C771 Secondary and unspecified malignant neoplasm of intrathoracic lymph nodes: Secondary | ICD-10-CM | POA: Diagnosis not present

## 2020-07-16 DIAGNOSIS — Z7951 Long term (current) use of inhaled steroids: Secondary | ICD-10-CM | POA: Diagnosis not present

## 2020-07-16 DIAGNOSIS — Z79899 Other long term (current) drug therapy: Secondary | ICD-10-CM | POA: Diagnosis not present

## 2020-07-16 LAB — CBC WITH DIFFERENTIAL (CANCER CENTER ONLY)
Abs Immature Granulocytes: 0.04 10*3/uL (ref 0.00–0.07)
Basophils Absolute: 0.1 10*3/uL (ref 0.0–0.1)
Basophils Relative: 1 %
Eosinophils Absolute: 0.2 10*3/uL (ref 0.0–0.5)
Eosinophils Relative: 2 %
HCT: 39.7 % (ref 36.0–46.0)
Hemoglobin: 13.2 g/dL (ref 12.0–15.0)
Immature Granulocytes: 1 %
Lymphocytes Relative: 11 %
Lymphs Abs: 0.9 10*3/uL (ref 0.7–4.0)
MCH: 30.8 pg (ref 26.0–34.0)
MCHC: 33.2 g/dL (ref 30.0–36.0)
MCV: 92.5 fL (ref 80.0–100.0)
Monocytes Absolute: 0.6 10*3/uL (ref 0.1–1.0)
Monocytes Relative: 7 %
Neutro Abs: 6.1 10*3/uL (ref 1.7–7.7)
Neutrophils Relative %: 78 %
Platelet Count: 330 10*3/uL (ref 150–400)
RBC: 4.29 MIL/uL (ref 3.87–5.11)
RDW: 13.3 % (ref 11.5–15.5)
WBC Count: 7.8 10*3/uL (ref 4.0–10.5)
nRBC: 0 % (ref 0.0–0.2)

## 2020-07-16 LAB — CMP (CANCER CENTER ONLY)
ALT: 8 U/L (ref 0–44)
AST: 13 U/L — ABNORMAL LOW (ref 15–41)
Albumin: 3.3 g/dL — ABNORMAL LOW (ref 3.5–5.0)
Alkaline Phosphatase: 103 U/L (ref 38–126)
Anion gap: 9 (ref 5–15)
BUN: 19 mg/dL (ref 8–23)
CO2: 25 mmol/L (ref 22–32)
Calcium: 9.7 mg/dL (ref 8.9–10.3)
Chloride: 109 mmol/L (ref 98–111)
Creatinine: 1.14 mg/dL — ABNORMAL HIGH (ref 0.44–1.00)
GFR, Est AFR Am: 58 mL/min — ABNORMAL LOW (ref >60)
GFR, Estimated: 50 mL/min — ABNORMAL LOW (ref >60)
Glucose, Bld: 95 mg/dL (ref 70–99)
Potassium: 3.6 mmol/L (ref 3.5–5.1)
Sodium: 143 mmol/L (ref 135–145)
Total Bilirubin: 0.4 mg/dL (ref 0.3–1.2)
Total Protein: 6.6 g/dL (ref 6.5–8.1)

## 2020-07-16 LAB — TSH: TSH: 3.801 u[IU]/mL (ref 0.308–3.960)

## 2020-07-16 MED ORDER — SODIUM CHLORIDE 0.9 % IV SOLN
Freq: Once | INTRAVENOUS | Status: AC
  Filled 2020-07-16: qty 250

## 2020-07-16 MED ORDER — HEPARIN SOD (PORK) LOCK FLUSH 100 UNIT/ML IV SOLN
500.0000 [IU] | Freq: Once | INTRAVENOUS | Status: AC | PRN
  Administered 2020-07-16: 500 [IU]
  Filled 2020-07-16: qty 5

## 2020-07-16 MED ORDER — SODIUM CHLORIDE 0.9% FLUSH
10.0000 mL | Freq: Once | INTRAVENOUS | Status: AC
  Administered 2020-07-16: 10 mL
  Filled 2020-07-16: qty 10

## 2020-07-16 MED ORDER — SODIUM CHLORIDE 0.9 % IV SOLN
1200.0000 mg | Freq: Once | INTRAVENOUS | Status: AC
  Administered 2020-07-16: 1200 mg via INTRAVENOUS
  Filled 2020-07-16: qty 20

## 2020-07-16 MED ORDER — SODIUM CHLORIDE 0.9% FLUSH
10.0000 mL | INTRAVENOUS | Status: DC | PRN
  Administered 2020-07-16: 10 mL
  Filled 2020-07-16: qty 10

## 2020-07-16 NOTE — Patient Instructions (Signed)
[facility] Discharge Instructions for Patients Receiving Chemotherapy  Today you received the following Immunotherapy agent: Atezolizumab(Tecentriq)  To help prevent nausea and vomiting after your treatment, we encourage you to take your nausea medication as directed by your MD.   If you develop nausea and vomiting that is not controlled by your nausea medication, call the clinic.   BELOW ARE SYMPTOMS THAT SHOULD BE REPORTED IMMEDIATELY:  *FEVER GREATER THAN 100.5 F  *CHILLS WITH OR WITHOUT FEVER  NAUSEA AND VOMITING THAT IS NOT CONTROLLED WITH YOUR NAUSEA MEDICATION  *UNUSUAL SHORTNESS OF BREATH  *UNUSUAL BRUISING OR BLEEDING  TENDERNESS IN MOUTH AND THROAT WITH OR WITHOUT PRESENCE OF ULCERS  *URINARY PROBLEMS  *BOWEL PROBLEMS  UNUSUAL RASH Items with * indicate a potential emergency and should be followed up as soon as possible.  Feel free to call the clinic should you have any questions or concerns. The clinic phone number is [phone].  Please show the [facility] at check-in to the Emergency Department and triage nurse.  Coronavirus (COVID-19) Are you at risk?  Are you at risk for the Coronavirus (COVID-19)?  To be considered HIGH RISK for Coronavirus (COVID-19), you have to meet the following criteria:  . Traveled to [place], [place], [place], [place] or [place]; or in the [place] to [place], [place], [place], or [place]; and have fever, cough, and shortness of breath within the last 2 weeks of travel OR . Been in close contact with a person diagnosed with COVID-19 within the last 2 weeks and have fever, cough, and shortness of breath . IF YOU DO NOT MEET THESE CRITERIA, YOU ARE CONSIDERED LOW RISK FOR COVID-19.  What to do if you are HIGH RISK for COVID-19?  Marland Kitchen If you are having a medical emergency, call 911. . Seek medical care right away. Before you go to a doctor's office, urgent care or emergency department, call ahead and  tell them about your recent travel, contact with someone diagnosed with COVID-19, and your symptoms. You should receive instructions from your physician's office regarding next steps of care.  . When you arrive at healthcare provider, tell the healthcare staff immediately you have returned from visiting [place], [place], [place], [place] or [place]; or traveled in the [place] to [place], [place], [place], or [place]; in the last two weeks or you have been in close contact with a person diagnosed with COVID-19 in the last 2 weeks.   . Tell the health care staff about your symptoms: fever, cough and shortness of breath. . After you have been seen by a medical provider, you will be either: o Tested for (COVID-19) and discharged home on quarantine except to seek medical care if symptoms worsen, and asked to  - Stay home and avoid contact with others until you get your results (4-5 days)  - Avoid travel on public transportation if possible (such as bus, train, or airplane) or o Sent to the Emergency Department by EMS for evaluation, COVID-19 testing, and possible admission depending on your condition and test results.  What to do if you are LOW RISK for COVID-19?  Reduce your risk of any infection by using the same precautions used for avoiding the common cold or flu:  Marland Kitchen Wash your hands often with soap and warm water for at least 20 seconds.  If soap and water are not readily available, use an alcohol-based hand sanitizer with at least 60% alcohol.  . If  coughing or sneezing, cover your mouth and nose by coughing or sneezing into the elbow areas of your shirt or coat, into a tissue or into your sleeve (not your hands). . Avoid shaking hands with others and consider head nods or verbal greetings only. . Avoid touching your eyes, nose, or mouth with unwashed hands.  . Avoid close contact with people who are sick. . Avoid places or events with large numbers of people in one location, like  concerts or sporting events. . Carefully consider travel plans you have or are making. . If you are planning any travel outside or inside the [place], visit the CDC's Travelers' Health webpage for the latest health notices. . If you have some symptoms but not all symptoms, continue to monitor at home and seek medical attention if your symptoms worsen. . If you are having a medical emergency, call 911.   [place] / e-Visit: [email or web address]         [facility]: [facility]: [phone]                   [facility]: [phone]

## 2020-07-21 ENCOUNTER — Telehealth: Payer: Self-pay | Admitting: Physician Assistant

## 2020-07-21 NOTE — Telephone Encounter (Signed)
Scheduled per los. Called and spoke with patient. Confirmed appt 

## 2020-08-04 ENCOUNTER — Ambulatory Visit (HOSPITAL_COMMUNITY)
Admission: RE | Admit: 2020-08-04 | Discharge: 2020-08-04 | Disposition: A | Discharge status: Home / Self Care | Payer: Medicare Other | Source: Ambulatory Visit | Attending: Physician Assistant | Admitting: Physician Assistant

## 2020-08-04 ENCOUNTER — Other Ambulatory Visit: Payer: Self-pay

## 2020-08-04 DIAGNOSIS — I700 Atherosclerosis of aorta: Secondary | ICD-10-CM | POA: Diagnosis not present

## 2020-08-04 DIAGNOSIS — C3491 Malignant neoplasm of unspecified part of right bronchus or lung: Secondary | ICD-10-CM

## 2020-08-04 DIAGNOSIS — E278 Other specified disorders of adrenal gland: Secondary | ICD-10-CM | POA: Diagnosis not present

## 2020-08-04 DIAGNOSIS — K449 Diaphragmatic hernia without obstruction or gangrene: Secondary | ICD-10-CM | POA: Diagnosis not present

## 2020-08-04 DIAGNOSIS — C3490 Malignant neoplasm of unspecified part of unspecified bronchus or lung: Secondary | ICD-10-CM | POA: Diagnosis not present

## 2020-08-04 MED ORDER — SODIUM CHLORIDE (PF) 0.9 % IJ SOLN
INTRAMUSCULAR | Status: AC
  Filled 2020-08-04: qty 50

## 2020-08-04 MED ORDER — HEPARIN SOD (PORK) LOCK FLUSH 100 UNIT/ML IV SOLN
500.0000 [IU] | Freq: Once | INTRAVENOUS | Status: AC
  Administered 2020-08-04: 500 [IU] via INTRAVENOUS

## 2020-08-04 MED ORDER — IOHEXOL 300 MG/ML  SOLN
100.0000 mL | Freq: Once | INTRAMUSCULAR | Status: AC | PRN
  Administered 2020-08-04: 100 mL via INTRAVENOUS

## 2020-08-05 ENCOUNTER — Inpatient Hospital Stay: Payer: Medicare Other | Attending: Internal Medicine | Admitting: Internal Medicine

## 2020-08-05 ENCOUNTER — Other Ambulatory Visit: Payer: Self-pay | Admitting: Internal Medicine

## 2020-08-05 ENCOUNTER — Other Ambulatory Visit: Payer: Self-pay

## 2020-08-05 ENCOUNTER — Encounter: Payer: Self-pay | Admitting: Internal Medicine

## 2020-08-05 ENCOUNTER — Inpatient Hospital Stay: Payer: Medicare Other

## 2020-08-05 VITALS — BP 122/89 | HR 89 | Temp 97.0°F | Resp 18 | Ht 66.0 in | Wt 209.6 lb

## 2020-08-05 DIAGNOSIS — R5383 Other fatigue: Secondary | ICD-10-CM | POA: Diagnosis not present

## 2020-08-05 DIAGNOSIS — Z923 Personal history of irradiation: Secondary | ICD-10-CM | POA: Diagnosis not present

## 2020-08-05 DIAGNOSIS — E039 Hypothyroidism, unspecified: Secondary | ICD-10-CM | POA: Insufficient documentation

## 2020-08-05 DIAGNOSIS — J449 Chronic obstructive pulmonary disease, unspecified: Secondary | ICD-10-CM | POA: Insufficient documentation

## 2020-08-05 DIAGNOSIS — Z7951 Long term (current) use of inhaled steroids: Secondary | ICD-10-CM | POA: Diagnosis not present

## 2020-08-05 DIAGNOSIS — Z5112 Encounter for antineoplastic immunotherapy: Secondary | ICD-10-CM | POA: Diagnosis not present

## 2020-08-05 DIAGNOSIS — Z791 Long term (current) use of non-steroidal anti-inflammatories (NSAID): Secondary | ICD-10-CM | POA: Insufficient documentation

## 2020-08-05 DIAGNOSIS — C3431 Malignant neoplasm of lower lobe, right bronchus or lung: Secondary | ICD-10-CM | POA: Diagnosis not present

## 2020-08-05 DIAGNOSIS — Z79899 Other long term (current) drug therapy: Secondary | ICD-10-CM | POA: Diagnosis not present

## 2020-08-05 DIAGNOSIS — C3491 Malignant neoplasm of unspecified part of right bronchus or lung: Secondary | ICD-10-CM

## 2020-08-05 DIAGNOSIS — M1990 Unspecified osteoarthritis, unspecified site: Secondary | ICD-10-CM | POA: Diagnosis not present

## 2020-08-05 DIAGNOSIS — Z95828 Presence of other vascular implants and grafts: Secondary | ICD-10-CM

## 2020-08-05 DIAGNOSIS — Z9221 Personal history of antineoplastic chemotherapy: Secondary | ICD-10-CM | POA: Diagnosis not present

## 2020-08-05 LAB — CMP (CANCER CENTER ONLY)
ALT: 6 U/L (ref 0–44)
AST: 13 U/L — ABNORMAL LOW (ref 15–41)
Albumin: 3.3 g/dL — ABNORMAL LOW (ref 3.5–5.0)
Alkaline Phosphatase: 114 U/L (ref 38–126)
Anion gap: 9 (ref 5–15)
BUN: 14 mg/dL (ref 8–23)
CO2: 27 mmol/L (ref 22–32)
Calcium: 10 mg/dL (ref 8.9–10.3)
Chloride: 104 mmol/L (ref 98–111)
Creatinine: 1.13 mg/dL — ABNORMAL HIGH (ref 0.44–1.00)
GFR, Est AFR Am: 58 mL/min — ABNORMAL LOW (ref >60)
GFR, Estimated: 50 mL/min — ABNORMAL LOW (ref >60)
Glucose, Bld: 83 mg/dL (ref 70–99)
Potassium: 3.7 mmol/L (ref 3.5–5.1)
Sodium: 140 mmol/L (ref 135–145)
Total Bilirubin: 0.3 mg/dL (ref 0.3–1.2)
Total Protein: 7 g/dL (ref 6.5–8.1)

## 2020-08-05 LAB — CBC WITH DIFFERENTIAL (CANCER CENTER ONLY)
Abs Immature Granulocytes: 0.03 10*3/uL (ref 0.00–0.07)
Basophils Absolute: 0.1 10*3/uL (ref 0.0–0.1)
Basophils Relative: 1 %
Eosinophils Absolute: 0.2 10*3/uL (ref 0.0–0.5)
Eosinophils Relative: 2 %
HCT: 39.1 % (ref 36.0–46.0)
Hemoglobin: 13.1 g/dL (ref 12.0–15.0)
Immature Granulocytes: 0 %
Lymphocytes Relative: 12 %
Lymphs Abs: 0.9 10*3/uL (ref 0.7–4.0)
MCH: 30.7 pg (ref 26.0–34.0)
MCHC: 33.5 g/dL (ref 30.0–36.0)
MCV: 91.6 fL (ref 80.0–100.0)
Monocytes Absolute: 0.6 10*3/uL (ref 0.1–1.0)
Monocytes Relative: 8 %
Neutro Abs: 5.6 10*3/uL (ref 1.7–7.7)
Neutrophils Relative %: 77 %
Platelet Count: 301 10*3/uL (ref 150–400)
RBC: 4.27 MIL/uL (ref 3.87–5.11)
RDW: 12.9 % (ref 11.5–15.5)
WBC Count: 7.4 10*3/uL (ref 4.0–10.5)
nRBC: 0 % (ref 0.0–0.2)

## 2020-08-05 LAB — TSH: TSH: 11.475 u[IU]/mL — ABNORMAL HIGH (ref 0.308–3.960)

## 2020-08-05 MED ORDER — SODIUM CHLORIDE 0.9% FLUSH
10.0000 mL | INTRAVENOUS | Status: DC | PRN
  Administered 2020-08-05: 10 mL
  Filled 2020-08-05: qty 10

## 2020-08-05 MED ORDER — SODIUM CHLORIDE 0.9% FLUSH
10.0000 mL | Freq: Once | INTRAVENOUS | Status: AC
  Administered 2020-08-05: 10 mL
  Filled 2020-08-05: qty 10

## 2020-08-05 MED ORDER — SODIUM CHLORIDE 0.9 % IV SOLN
1200.0000 mg | Freq: Once | INTRAVENOUS | Status: AC
  Administered 2020-08-05: 1200 mg via INTRAVENOUS
  Filled 2020-08-05: qty 20

## 2020-08-05 MED ORDER — HEPARIN SOD (PORK) LOCK FLUSH 100 UNIT/ML IV SOLN
500.0000 [IU] | Freq: Once | INTRAVENOUS | Status: AC | PRN
  Administered 2020-08-05: 500 [IU]
  Filled 2020-08-05: qty 5

## 2020-08-05 MED ORDER — SODIUM CHLORIDE 0.9 % IV SOLN
Freq: Once | INTRAVENOUS | Status: AC
  Filled 2020-08-05: qty 250

## 2020-08-05 NOTE — Progress Notes (Signed)
Early Telephone:([phone]) [phone]   Fax:(336) [place], MD [place] [address] [place] [address]  DIAGNOSIS: Recurrent small cell lung cancer initially diagnosed as Limited stage (T2b, N2, M0) small cell lung cancer diagnosed in November 2017 and presented with large right hilar mass with mediastinal invasion and a right lower lobe pulmonary nodule.  The patient had disease recurrence in the right lower lobe and mediastinal lymph nodes in February 2020.  PRIOR THERAPY:   1) Systemic chemotherapy with cisplatin 60 MG/M2 on day 1 and etoposide 120 MG/M2 on days 1, 2 and 3 concurrent with radiation status post 6 cycles.  Last dose was giving March 06, 2017. 2) status post prophylactic cranial irradiation.  CURRENT THERAPY: Systemic chemotherapy with carboplatin for AUC of 5 on day 1, etoposide.  100 mg/M2 on days 1, 2 and 3 as well as Tecentriq 1200 mg IV with Neulasta support every 3 weeks.  First dose March 05, 2019.  Status post 17 cycles.  Starting from cycle 5, the patient will be treated with single agent Tecentriq 1200 mg IV every 3 weeks.  First dose May 28, 2019.  INTERVAL HISTORY: Tammy Bowers 69 y.o. female returns to the clinic today for follow-up visit.  The patient is feeling fine today with no concerning complaints except for fatigue.  She has mild shortness of breath with exertion.  She denied having any chest pain, cough or hemoptysis.  She denied having any fever or chills.  She has no nausea, vomiting, diarrhea or constipation.  She has no headache or visual changes.  She received the last 2 doses of her treatment with [facility] in [place].  The patient returned back to [facility] recently and she is here to resume her treatment after repeating CT scan of the chest, abdomen pelvis for restaging of her disease.  MEDICAL HISTORY: Past Medical History:  Diagnosis Date  . Adenopathy 10/10/2016   PERICARINAL    . Anemia   . Arthritis   . Asthma   . Cancer ([place])    lung cancer Small Cell  . COPD (chronic obstructive pulmonary disease) ([place])   . Dyspnea   . Headache    migraines  . Hilar mass 10/10/2016   RIGHT  . History of kidney stones   . Insomnia   . Lung nodules 10/10/2016   RIGHT LOWER LOBE  . Mass of both adrenal glands ([place]) 10/10/2016  . Mood swing   . Pneumonia   . Spinal headache   . Substance abuse ([place])    TOBACCO  . UNSPECIFIED INFECTION OF BONE ANKLE AND FOOT 10/22/2009   Annotation: left ankle Qualifier: Diagnosis of  By: Patsy Baltimore RN, Denise      ALLERGIES:  is allergic to lidocaine.  MEDICATIONS:  Current Outpatient Medications  Medication Sig Dispense Refill  . albuterol (PROVENTIL HFA;VENTOLIN HFA) 108 (90 BASE) MCG/ACT inhaler Inhale 2 puffs into the lungs every 4 (four) hours as needed for wheezing.    . Cyclobenzaprine HCl (FLEXERIL PO) Take 5 mg by mouth 3 (three) times daily as needed (muscle spasms).     . diphenoxylate-atropine (LOMOTIL) 2.5-0.025 MG tablet Take 1 tablet by mouth 4 (four) times daily as needed for diarrhea or loose stools. 30 tablet 0  . Fluticasone-Salmeterol (ADVAIR) 500-50 MCG/DOSE AEPB Inhale 1 puff into the lungs 2 (two) times daily.    Marland Kitchen ibuprofen (ADVIL) 800 MG tablet Take 800 mg by mouth  every 8 (eight) hours as needed.    Marland Kitchen levothyroxine (SYNTHROID) 75 MCG tablet TAKE 1 TABLET BY MOUTH EVERY DAY (Patient taking differently: 88 mcg. TAKE 1 TABLET BY MOUTH EVERY DAY) 90 tablet 0  . levothyroxine (SYNTHROID) 88 MCG tablet Take 88 mcg by mouth daily.    Marland Kitchen LORazepam (ATIVAN) 0.5 MG tablet Take 1 tablet (0.5 mg total) by mouth at bedtime. 20 tablet 0  . Multiple Vitamin (MULTIVITAMIN WITH MINERALS) TABS tablet Take 1 tablet by mouth daily.  (Patient not taking: Reported on 07/16/2020)    . prochlorperazine (COMPAZINE) 10 MG tablet TAKE 1 TABLET(10 MG) BY MOUTH EVERY 6 HOURS AS NEEDED FOR NAUSEA OR VOMITING 30 tablet 0   No current  facility-administered medications for this visit.    SURGICAL HISTORY:  Past Surgical History:  Procedure Laterality Date  . ANKLE SURGERY     hardware removal, and initial ankle surgery  . APPENDECTOMY    . IR IMAGING GUIDED PORT INSERTION  04/12/2019  . LUNG BIOPSY N/A 10/19/2016   Procedure: LUNG BIOPSY;  Surgeon: Grace Isaac, MD;  Location: [facility];  Service: Thoracic;  Laterality: N/A;  . LUNG BIOPSY N/A 02/20/2019   Procedure: LUNG BIOPSY;  Surgeon: Grace Isaac, MD;  Location: [facility];  Service: Thoracic;  Laterality: N/A;  . TONSILLECTOMY    . VIDEO BRONCHOSCOPY WITH ENDOBRONCHIAL ULTRASOUND N/A 10/19/2016   Procedure: VIDEO BRONCHOSCOPY WITH ENDOBRONCHIAL ULTRASOUND;  Surgeon: Grace Isaac, MD;  Location: [facility];  Service: Thoracic;  Laterality: N/A;  . VIDEO BRONCHOSCOPY WITH ENDOBRONCHIAL ULTRASOUND N/A 02/20/2019   Procedure: VIDEO BRONCHOSCOPY WITH ENDOBRONCHIAL ULTRASOUND;  Surgeon: Grace Isaac, MD;  Location: [facility];  Service: Thoracic;  Laterality: N/A;    REVIEW OF SYSTEMS:  Constitutional: positive for fatigue Eyes: negative Ears, nose, mouth, throat, and face: negative Respiratory: positive for dyspnea on exertion Cardiovascular: negative Gastrointestinal: negative Genitourinary:negative Integument/breast: negative Hematologic/lymphatic: negative Musculoskeletal:negative Neurological: negative Behavioral/Psych: negative Endocrine: negative Allergic/Immunologic: negative   PHYSICAL EXAMINATION: General appearance: alert, cooperative, fatigued and no distress Head: Normocephalic, without obvious abnormality, atraumatic Neck: no adenopathy, no JVD, supple, symmetrical, trachea midline and thyroid not enlarged, symmetric, no tenderness/mass/nodules Lymph nodes: Cervical, supraclavicular, and axillary nodes normal. Resp: clear to auscultation bilaterally Back: symmetric, no curvature. ROM normal. No CVA tenderness. Cardio: regular rate and rhythm,  S1, S2 normal, no murmur, click, rub or gallop GI: soft, non-tender; bowel sounds normal; no masses,  no organomegaly Extremities: extremities normal, atraumatic, no cyanosis or edema Neurologic: Alert and oriented X 3, normal strength and tone. Normal symmetric reflexes. Normal coordination and gait  ECOG PERFORMANCE STATUS: 1 - Symptomatic but completely ambulatory  Blood pressure 122/89, pulse 89, temperature (!) 97 F (36.1 C), temperature source Tympanic, resp. rate 18, height 5\' 6"  (1.676 m), weight 209 lb 9.6 oz (95.1 kg), SpO2 95 %.  LABORATORY DATA: Lab Results  Component Value Date   WBC 7.4 08/05/2020   HGB 13.1 08/05/2020   HCT 39.1 08/05/2020   MCV 91.6 08/05/2020   PLT 301 08/05/2020      Chemistry      Component Value Date/Time   NA 143 07/16/2020 1428   NA 139 11/24/2017 0913   K 3.6 07/16/2020 1428   K 3.8 11/24/2017 0913   CL 109 07/16/2020 1428   CO2 25 07/16/2020 1428   CO2 24 11/24/2017 0913   BUN 19 07/16/2020 1428   BUN 22.1 11/24/2017 0913   CREATININE 1.14 (H) 07/16/2020 1428  CREATININE 1.2 (H) 11/24/2017 0913      Component Value Date/Time   CALCIUM 9.7 07/16/2020 1428   CALCIUM 9.7 11/24/2017 0913   ALKPHOS 103 07/16/2020 1428   ALKPHOS 99 11/24/2017 0913   AST 13 (L) 07/16/2020 1428   AST 15 11/24/2017 0913   ALT 8 07/16/2020 1428   ALT 7 11/24/2017 0913   BILITOT 0.4 07/16/2020 1428   BILITOT 0.37 11/24/2017 0913       RADIOGRAPHIC STUDIES: CT Chest W Contrast  Result Date: 08/05/2020 CLINICAL DATA:  Primary Cancer Type: Extensive stage small cell lung cancer. Imaging Indication: Routine surveillance Interval therapy since last imaging? Yes Initial Cancer Diagnosis Date: 10/19/2016; Established by: Biopsy-proven Detailed Pathology: Recurrent small cell lung cancer initially diagnosed as Limited stage small cell lung cancer. Primary Tumor location: Initial: right hilar mass with mediastinal invasion and a right lower lobe pulmonary  nodule. Recurrence: right lower lobe and mediastinal lymph nodes. Recurrence? Yes; Date(s) of recurrence: 02/20/2019; Established by: Biopsy-proven Surgeries: No thoracic.  Appendectomy. Chemotherapy: Yes; Ongoing?  No; Most recent administration: 05/2019 Immunotherapy?  Yes; Type: Tecentriq; Ongoing? Yes Radiation therapy? Yes Date Range: 04/11/2017 - 04/21/2017; Target: Prophylactic brain irradiation Date Range: 12/13/2016 - 01/31/2017; Target: Right lung EXAM: CT CHEST, ABDOMEN, AND PELVIS WITH CONTRAST TECHNIQUE: Multidetector CT imaging of the chest, abdomen and pelvis was performed following the standard protocol during bolus administration of intravenous contrast. CONTRAST:  19mL OMNIPAQUE IOHEXOL 300 MG/ML  SOLN COMPARISON:  Most recent CT chest, abdomen and pelvis 04/30/2020. 01/28/2019 PET-CT. FINDINGS: CT CHEST FINDINGS Cardiovascular: Right IJ power port terminates in the SVC. Atherosclerotic calcification of the aorta. Heart size normal. No pericardial effusion. Mediastinum/Nodes: AP window lymph node has increased in size, now measuring 9 mm, previously 4 mm. Subcarinal lymph node is stable, 1.3 cm. No left hilar or axillary adenopathy. Esophagus is unremarkable. Lungs/Pleura: Post radiation volume loss in the medial aspect of the right hemithorax. Necrotic appearing mass in the right perihilar/subcarinal region has enlarged, measuring 2.1 x 2.9 cm (3/30), compared to 1.1 x 1.3 cm on 04/30/2020. Associated obstruction of the right lower lobe bronchus with fluid-filled right lower lobe bronchi and complete collapse of the right lower lobe. Small right pleural effusion is new. 3 mm medial posterior segment right upper lobe nodule (4/68), possibly new. Mild centrilobular and paraseptal emphysema. Irregularity of right upper and right middle lobe bronchi. Airway is otherwise unremarkable. Musculoskeletal: No worrisome lytic or sclerotic lesions. CT ABDOMEN PELVIS FINDINGS Hepatobiliary: Liver and  gallbladder are unremarkable. No biliary ductal dilatation. Pancreas: Negative. Spleen: Negative. Adrenals/Urinary Tract: Heterogeneous right adrenal mass is stable in size, 3.0 x 3.4 cm. Left adrenal gland and right kidney are unremarkable. Subcentimeter low-attenuation lesion in the interpolar left kidney is too small to characterize but statistically, a cyst is likely. Ureters are decompressed. Bladder is unremarkable. Stomach/Bowel: Small hiatal hernia. Stomach, small bowel and colon are unremarkable. Appendix is not readily visualized. Vascular/Lymphatic: Atherosclerotic calcification of the aorta without aneurysm. No pathologically enlarged lymph nodes. Reproductive: Uterus is visualized.  No adnexal mass. Other: No free fluid.  Mesenteries and peritoneum are unremarkable. Musculoskeletal: No worrisome lytic or sclerotic lesions. IMPRESSION: 1. Enlarging right perihilar mass with obstruction of the right lower lobe bronchus and complete collapse of the right lower lobe. Associated small right pleural effusion. 2. 3 mm posteromedial right upper lobe nodule, possibly new. Attention on follow-up is recommended. 3. Stable right adrenal previously characterized as an adenoma. 4.  Aortic atherosclerosis (ICD10-I70.0). 5.  Emphysema (  ICD10-J43.9). Electronically Signed   By: Lorin Picket M.D.   On: 08/05/2020 10:01   CT Abdomen Pelvis W Contrast  Result Date: 08/05/2020 CLINICAL DATA:  Primary Cancer Type: Extensive stage small cell lung cancer. Imaging Indication: Routine surveillance Interval therapy since last imaging? Yes Initial Cancer Diagnosis Date: 10/19/2016; Established by: Biopsy-proven Detailed Pathology: Recurrent small cell lung cancer initially diagnosed as Limited stage small cell lung cancer. Primary Tumor location: Initial: right hilar mass with mediastinal invasion and a right lower lobe pulmonary nodule. Recurrence: right lower lobe and mediastinal lymph nodes. Recurrence? Yes; Date(s) of  recurrence: 02/20/2019; Established by: Biopsy-proven Surgeries: No thoracic.  Appendectomy. Chemotherapy: Yes; Ongoing?  No; Most recent administration: 05/2019 Immunotherapy?  Yes; Type: Tecentriq; Ongoing? Yes Radiation therapy? Yes Date Range: 04/11/2017 - 04/21/2017; Target: Prophylactic brain irradiation Date Range: 12/13/2016 - 01/31/2017; Target: Right lung EXAM: CT CHEST, ABDOMEN, AND PELVIS WITH CONTRAST TECHNIQUE: Multidetector CT imaging of the chest, abdomen and pelvis was performed following the standard protocol during bolus administration of intravenous contrast. CONTRAST:  165mL OMNIPAQUE IOHEXOL 300 MG/ML  SOLN COMPARISON:  Most recent CT chest, abdomen and pelvis 04/30/2020. 01/28/2019 PET-CT. FINDINGS: CT CHEST FINDINGS Cardiovascular: Right IJ power port terminates in the SVC. Atherosclerotic calcification of the aorta. Heart size normal. No pericardial effusion. Mediastinum/Nodes: AP window lymph node has increased in size, now measuring 9 mm, previously 4 mm. Subcarinal lymph node is stable, 1.3 cm. No left hilar or axillary adenopathy. Esophagus is unremarkable. Lungs/Pleura: Post radiation volume loss in the medial aspect of the right hemithorax. Necrotic appearing mass in the right perihilar/subcarinal region has enlarged, measuring 2.1 x 2.9 cm (3/30), compared to 1.1 x 1.3 cm on 04/30/2020. Associated obstruction of the right lower lobe bronchus with fluid-filled right lower lobe bronchi and complete collapse of the right lower lobe. Small right pleural effusion is new. 3 mm medial posterior segment right upper lobe nodule (4/68), possibly new. Mild centrilobular and paraseptal emphysema. Irregularity of right upper and right middle lobe bronchi. Airway is otherwise unremarkable. Musculoskeletal: No worrisome lytic or sclerotic lesions. CT ABDOMEN PELVIS FINDINGS Hepatobiliary: Liver and gallbladder are unremarkable. No biliary ductal dilatation. Pancreas: Negative. Spleen: Negative.  Adrenals/Urinary Tract: Heterogeneous right adrenal mass is stable in size, 3.0 x 3.4 cm. Left adrenal gland and right kidney are unremarkable. Subcentimeter low-attenuation lesion in the interpolar left kidney is too small to characterize but statistically, a cyst is likely. Ureters are decompressed. Bladder is unremarkable. Stomach/Bowel: Small hiatal hernia. Stomach, small bowel and colon are unremarkable. Appendix is not readily visualized. Vascular/Lymphatic: Atherosclerotic calcification of the aorta without aneurysm. No pathologically enlarged lymph nodes. Reproductive: Uterus is visualized.  No adnexal mass. Other: No free fluid.  Mesenteries and peritoneum are unremarkable. Musculoskeletal: No worrisome lytic or sclerotic lesions. IMPRESSION: 1. Enlarging right perihilar mass with obstruction of the right lower lobe bronchus and complete collapse of the right lower lobe. Associated small right pleural effusion. 2. 3 mm posteromedial right upper lobe nodule, possibly new. Attention on follow-up is recommended. 3. Stable right adrenal previously characterized as an adenoma. 4.  Aortic atherosclerosis (ICD10-I70.0). 5.  Emphysema (ICD10-J43.9). Electronically Signed   By: Lorin Picket M.D.   On: 08/05/2020 10:01    ASSESSMENT AND PLAN:  This is a very pleasant 69 years old white female with recurrent small cell lung cancer that was initially diagnosed as limited stage disease status post a course of systemic chemotherapy with cisplatin and etoposide for 6 cycles concurrent with radiation. She  tolerated her treatment well except for fatigue. She is also status post prophylactic cranial irradiation. The patient has been in observation since March 2018. She was found on recent imaging studies to have evidence for disease recurrence with right lower lobe pulmonary nodules in addition to mediastinal lymphadenopathy. Repeat bronchoscopy with endobronchial ultrasound and biopsy confirmed recurrence of his  small cell lung cancer. The patient was started on systemic chemotherapy with carboplatin, etoposide and Tecentriq status post 17 cycles.  She has partial response after cycle #4 and starting from cycle #5 the patient is currently on maintenance treatment with Tecentriq.  The patient has been tolerating her treatment with Tecentriq fairly well with no concerning adverse effects. She had repeat CT scan of the chest, abdomen pelvis performed recently.  I personally and independently reviewed the scan images and discussed the result and showed the images to the patient today. Her scan showed enlarging right perihilar mass with obstruction of the right lower lobe bronchus and complete collapse of the right lower lobe.  There was also 3 mm posterior medial right upper lobe nodule. I recommended for the patient to continue her current treatment with Tecentriq every 3 weeks but I would refer her to Dr. Tammi Klippel from radiation oncology for consideration of palliative radiotherapy to the enlarging perihilar obstructive mass. The patient will come back for follow-up visit in 3 weeks for evaluation before the next cycle of her treatment. For the hypothyroidism, she will continue her current treatment with levothyroxine and will monitor her TSH closely and adjust her dose as needed. She was advised to call immediately if she has any concerning symptoms in the interval. The patient voices understanding of current disease status and treatment options and is in agreement with the current care plan.  All questions were answered. The patient knows to call the clinic with any problems, questions or concerns. We can certainly see the patient much sooner if necessary.  Disclaimer: This note was dictated with voice recognition software. Similar sounding words can inadvertently be transcribed and may not be corrected upon review.

## 2020-08-05 NOTE — Patient Instructions (Signed)
Steps to Quit Smoking Smoking tobacco is the leading cause of preventable death. It can affect almost every organ in the body. Smoking puts you and people around you at risk for many serious, long-lasting (chronic) diseases. Quitting smoking can be hard, but it is one of the best things that you can do for your health. It is never too late to quit. How do I get ready to quit? When you decide to quit smoking, make a plan to help you succeed. Before you quit:  Pick a date to quit. Set a date within the next 2 weeks to give you time to prepare.  Write down the reasons why you are quitting. Keep this list in places where you will see it often.  Tell your family, friends, and co-workers that you are quitting. Their support is important.  Talk with your doctor about the choices that may help you quit.  Find out if your health insurance will pay for these treatments.  Know the people, places, things, and activities that make you want to smoke (triggers). Avoid them. What first steps can I take to quit smoking?  Throw away all cigarettes at home, at work, and in your car.  Throw away the things that you use when you smoke, such as ashtrays and lighters.  Clean your car. Make sure to empty the ashtray.  Clean your home, including curtains and carpets. What can I do to help me quit smoking? Talk with your doctor about taking medicines and seeing a counselor at the same time. You are more likely to succeed when you do both.  If you are pregnant or breastfeeding, talk with your doctor about counseling or other ways to quit smoking. Do not take medicine to help you quit smoking unless your doctor tells you to do so. To quit smoking: Quit right away  Quit smoking totally, instead of slowly cutting back on how much you smoke over a period of time.  Go to counseling. You are more likely to quit if you go to counseling sessions regularly. Take medicine You may take medicines to help you quit. Some  medicines need a prescription, and some you can buy over-the-counter. Some medicines may contain a drug called nicotine to replace the nicotine in cigarettes. Medicines may:  Help you to stop having the desire to smoke (cravings).  Help to stop the problems that come when you stop smoking (withdrawal symptoms). Your doctor may ask you to use:  Nicotine patches, gum, or lozenges.  Nicotine inhalers or sprays.  Non-nicotine medicine that is taken by mouth. Find resources Find resources and other ways to help you quit smoking and remain smoke-free after you quit. These resources are most helpful when you use them often. They include:  Online chats with a counselor.  Phone quitlines.  Printed self-help materials.  Support groups or group counseling.  Text messaging programs.  Mobile phone apps. Use apps on your mobile phone or tablet that can help you stick to your quit plan. There are many free apps for mobile phones and tablets as well as websites. Examples include Quit Guide from the CDC and smokefree.gov  What things can I do to make it easier to quit?   Talk to your family and friends. Ask them to support and encourage you.  Call a phone quitline (1-800-QUIT-NOW), reach out to support groups, or work with a counselor.  Ask people who smoke to not smoke around you.  Avoid places that make you want to smoke,   such as: ? Bars. ? Parties. ? Smoke-break areas at work.  Spend time with people who do not smoke.  Lower the stress in your life. Stress can make you want to smoke. Try these things to help your stress: ? Getting regular exercise. ? Doing deep-breathing exercises. ? Doing yoga. ? Meditating. ? Doing a body scan. To do this, close your eyes, focus on one area of your body at a time from head to toe. Notice which parts of your body are tense. Try to relax the muscles in those areas. How will I feel when I quit smoking? Day 1 to 3 weeks Within the first 24 hours,  you may start to have some problems that come from quitting tobacco. These problems are very bad 2-3 days after you quit, but they do not often last for more than 2-3 weeks. You may get these symptoms:  Mood swings.  Feeling restless, nervous, angry, or annoyed.  Trouble concentrating.  Dizziness.  Strong desire for high-sugar foods and nicotine.  Weight gain.  Trouble pooping (constipation).  Feeling like you may vomit (nausea).  Coughing or a sore throat.  Changes in how the medicines that you take for other issues work in your body.  Depression.  Trouble sleeping (insomnia). Week 3 and afterward After the first 2-3 weeks of quitting, you may start to notice more positive results, such as:  Better sense of smell and taste.  Less coughing and sore throat.  Slower heart rate.  Lower blood pressure.  Clearer skin.  Better breathing.  Fewer sick days. Quitting smoking can be hard. Do not give up if you fail the first time. Some people need to try a few times before they succeed. Do your best to stick to your quit plan, and talk with your doctor if you have any questions or concerns. Summary  Smoking tobacco is the leading cause of preventable death. Quitting smoking can be hard, but it is one of the best things that you can do for your health.  When you decide to quit smoking, make a plan to help you succeed.  Quit smoking right away, not slowly over a period of time.  When you start quitting, seek help from your doctor, family, or friends. This information is not intended to replace advice given to you by your health care provider. Make sure you discuss any questions you have with your health care provider. Document Revised: 08/30/2019 Document Reviewed: 02/23/2019 Elsevier Patient Education  2020 Elsevier Inc.  

## 2020-08-05 NOTE — Patient Instructions (Signed)

## 2020-08-05 NOTE — Patient Instructions (Signed)
[facility] Discharge Instructions for Patients Receiving Chemotherapy  Today you received the following Immunotherapy agent: Atezolizumab(Tecentriq)  To help prevent nausea and vomiting after your treatment, we encourage you to take your nausea medication as directed by your MD.   If you develop nausea and vomiting that is not controlled by your nausea medication, call the clinic.   BELOW ARE SYMPTOMS THAT SHOULD BE REPORTED IMMEDIATELY:  *FEVER GREATER THAN 100.5 F  *CHILLS WITH OR WITHOUT FEVER  NAUSEA AND VOMITING THAT IS NOT CONTROLLED WITH YOUR NAUSEA MEDICATION  *UNUSUAL SHORTNESS OF BREATH  *UNUSUAL BRUISING OR BLEEDING  TENDERNESS IN MOUTH AND THROAT WITH OR WITHOUT PRESENCE OF ULCERS  *URINARY PROBLEMS  *BOWEL PROBLEMS  UNUSUAL RASH Items with * indicate a potential emergency and should be followed up as soon as possible.  Feel free to call the clinic should you have any questions or concerns. The clinic phone number is [phone].  Please show the [facility] at check-in to the Emergency Department and triage nurse.  Coronavirus (COVID-19) Are you at risk?  Are you at risk for the Coronavirus (COVID-19)?  To be considered HIGH RISK for Coronavirus (COVID-19), you have to meet the following criteria:  . Traveled to [place], [place], [place], [place] or [place]; or in the [place] to [place], [place], [place], or [place]; and have fever, cough, and shortness of breath within the last 2 weeks of travel OR . Been in close contact with a person diagnosed with COVID-19 within the last 2 weeks and have fever, cough, and shortness of breath . IF YOU DO NOT MEET THESE CRITERIA, YOU ARE CONSIDERED LOW RISK FOR COVID-19.  What to do if you are HIGH RISK for COVID-19?  Marland Kitchen If you are having a medical emergency, call 911. . Seek medical care right away. Before you go to a doctor's office, urgent care or emergency department, call ahead and  tell them about your recent travel, contact with someone diagnosed with COVID-19, and your symptoms. You should receive instructions from your physician's office regarding next steps of care.  . When you arrive at healthcare provider, tell the healthcare staff immediately you have returned from visiting [place], [place], [place], [place] or [place]; or traveled in the [place] to [place], [place], [place], or [place]; in the last two weeks or you have been in close contact with a person diagnosed with COVID-19 in the last 2 weeks.   . Tell the health care staff about your symptoms: fever, cough and shortness of breath. . After you have been seen by a medical provider, you will be either: o Tested for (COVID-19) and discharged home on quarantine except to seek medical care if symptoms worsen, and asked to  - Stay home and avoid contact with others until you get your results (4-5 days)  - Avoid travel on public transportation if possible (such as bus, train, or airplane) or o Sent to the Emergency Department by EMS for evaluation, COVID-19 testing, and possible admission depending on your condition and test results.  What to do if you are LOW RISK for COVID-19?  Reduce your risk of any infection by using the same precautions used for avoiding the common cold or flu:  Marland Kitchen Wash your hands often with soap and warm water for at least 20 seconds.  If soap and water are not readily available, use an alcohol-based hand sanitizer with at least 60% alcohol.  . If  coughing or sneezing, cover your mouth and nose by coughing or sneezing into the elbow areas of your shirt or coat, into a tissue or into your sleeve (not your hands). . Avoid shaking hands with others and consider head nods or verbal greetings only. . Avoid touching your eyes, nose, or mouth with unwashed hands.  . Avoid close contact with people who are sick. . Avoid places or events with large numbers of people in one location, like  concerts or sporting events. . Carefully consider travel plans you have or are making. . If you are planning any travel outside or inside the [place], visit the CDC's Travelers' Health webpage for the latest health notices. . If you have some symptoms but not all symptoms, continue to monitor at home and seek medical attention if your symptoms worsen. . If you are having a medical emergency, call 911.   [facility] / e-Visit: [email or web address]         [facility]: [facility]: [phone]                   [facility]: [phone]

## 2020-08-06 ENCOUNTER — Telehealth: Payer: Self-pay

## 2020-08-06 ENCOUNTER — Other Ambulatory Visit: Payer: Self-pay | Admitting: Physician Assistant

## 2020-08-06 DIAGNOSIS — R5382 Chronic fatigue, unspecified: Secondary | ICD-10-CM

## 2020-08-06 MED ORDER — LEVOTHYROXINE SODIUM 100 MCG PO TABS: 100.0000 ug | ORAL_TABLET | Freq: Every day | ORAL | 1 refills | Status: DC

## 2020-08-06 NOTE — Telephone Encounter (Signed)
TC to pt per Cassie PA to let her know that cassie is going to increase her synthroid dose. Patient verbalized understanding

## 2020-08-07 ENCOUNTER — Ambulatory Visit: Payer: Medicare Other | Admitting: Radiation Oncology

## 2020-08-07 ENCOUNTER — Telehealth: Payer: Self-pay | Admitting: Radiation Oncology

## 2020-08-07 ENCOUNTER — Ambulatory Visit: Payer: Medicaid Other

## 2020-08-07 ENCOUNTER — Telehealth: Payer: Self-pay | Admitting: Urology

## 2020-08-07 ENCOUNTER — Ambulatory Visit: Payer: Medicaid Other | Admitting: Urology

## 2020-08-07 NOTE — Telephone Encounter (Signed)
Received voicemail message from patient requesting return call. Phoned back promptly. Spoke with patient's husband, Jenny Reichmann. Jenny Reichmann explains his wife isn't feeling well and needs to reschedule the appointment for today. Explained the appointment will be cancelled and our scheduling staff will be in touch. [name] verbalized understanding and expressed appreciation for the return call.

## 2020-08-07 NOTE — Telephone Encounter (Signed)
Patient husband called to cancel as he says Tammy Bowers isn't feeling well today. I have let our radiation team know and to ask Ashlyn Bruning, PA when we can next schedule patient. I advsd pt that we will call back when we have an answer.

## 2020-08-13 ENCOUNTER — Ambulatory Visit
Admission: RE | Admit: 2020-08-13 | Discharge: 2020-08-13 | Disposition: A | Discharge status: Home / Self Care | Payer: Medicare Other | Source: Ambulatory Visit | Attending: Radiation Oncology | Admitting: Radiation Oncology

## 2020-08-13 ENCOUNTER — Ambulatory Visit
Admission: RE | Admit: 2020-08-13 | Discharge: 2020-08-13 | Disposition: A | Discharge status: Home / Self Care | Payer: Medicare Other | Source: Ambulatory Visit | Attending: Urology | Admitting: Urology

## 2020-08-13 ENCOUNTER — Telehealth: Payer: Self-pay | Admitting: Radiation Oncology

## 2020-08-13 ENCOUNTER — Other Ambulatory Visit: Payer: Self-pay

## 2020-08-13 ENCOUNTER — Ambulatory Visit: Payer: Medicare Other

## 2020-08-13 DIAGNOSIS — J988 Other specified respiratory disorders: Secondary | ICD-10-CM | POA: Diagnosis not present

## 2020-08-13 DIAGNOSIS — C3491 Malignant neoplasm of unspecified part of right bronchus or lung: Secondary | ICD-10-CM

## 2020-08-13 DIAGNOSIS — F17210 Nicotine dependence, cigarettes, uncomplicated: Secondary | ICD-10-CM | POA: Diagnosis not present

## 2020-08-13 DIAGNOSIS — Z510 Encounter for antineoplastic radiation therapy: Secondary | ICD-10-CM | POA: Insufficient documentation

## 2020-08-13 DIAGNOSIS — R918 Other nonspecific abnormal finding of lung field: Secondary | ICD-10-CM | POA: Diagnosis not present

## 2020-08-13 DIAGNOSIS — C3401 Malignant neoplasm of right main bronchus: Secondary | ICD-10-CM | POA: Diagnosis not present

## 2020-08-13 DIAGNOSIS — C3411 Malignant neoplasm of upper lobe, right bronchus or lung: Secondary | ICD-10-CM | POA: Diagnosis not present

## 2020-08-13 DIAGNOSIS — Z85118 Personal history of other malignant neoplasm of bronchus and lung: Secondary | ICD-10-CM | POA: Diagnosis not present

## 2020-08-13 NOTE — Telephone Encounter (Addendum)
After speaking with the provider, Tammy Bowers, this RN phoned back to the patient's home. Spoke with patient's husband, Jenny Reichmann. Inquired if they could be present at 0930 today. He confirms they can. Updated staff of change of plan. No need to reschedule.

## 2020-08-13 NOTE — Telephone Encounter (Signed)
Patient had not presented for reconsult. Phoned patient to inquire. Woke patient up. Patient requesting to reschedule. Will inform provider. Will contact scheduling to reach out to patient and reschedule appointment.

## 2020-08-14 ENCOUNTER — Ambulatory Visit: Admission: RE | Admit: 2020-08-14 | Payer: Medicare Other | Source: Ambulatory Visit | Admitting: Radiation Oncology

## 2020-08-14 NOTE — Progress Notes (Signed)
  Radiation Oncology         [phone] ________________________________  Name: Tammy Bowers MRN: [id number]  Date: 08/13/2020  DOB: 06/04/1951  SIMULATION AND TREATMENT PLANNING NOTE    ICD-10-CM   1. 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  Identity was confirmed.  All relevant records and images related to the planned course of therapy were reviewed.  The patient freely provided informed written consent to proceed with treatment after reviewing the details related to the planned course of therapy. The consent form was witnessed and verified by the simulation staff.  Then, the patient was set-up in a stable reproducible  supine position for radiation therapy.  CT images were obtained.  Surface markings were placed.  The CT images were loaded into the planning software.  Then the target and avoidance structures were contoured.  Treatment planning then occurred.  The radiation prescription was entered and confirmed.  Then, I designed and supervised the construction of a total of 6 medically necessary complex treatment devices, including a BodyFix immobilization mold custom fitted to the patient along with 5 multileaf collimators conformally shaped radiation around the treatment target while shielding critical structures such as the heart and spinal cord maximally.  I have requested : 3D Simulation  I have requested a DVH of the following structures: Left lung, right lung, spinal cord, heart, esophagus, and target.  I have ordered:Nutrition Consult  SPECIAL TREATMENT PROCEDURE:  The planned course of therapy using radiation constitutes a special treatment procedure. Special care is required in the management of this patient for the following reasons. This treatment constitutes a  Special Treatment Procedure for the following reason: [ Retreatment in a previously radiated area requiring careful monitoring of increased risk of toxicity due to overlap of previous treatment..  The special nature of the planned course of radiotherapy will require increased physician supervision and oversight to ensure patient's safety with optimal treatment outcomes.  PLAN:  The patient will receive 35 Gy in 14 fractions.  ________________________________  Sheral Apley Tammi Klippel, M.D.

## 2020-08-16 NOTE — Progress Notes (Signed)
Radiation Oncology         [phone] ________________________________   Outpatient Re-Consultation  Name: Tammy Bowers MRN: [id number]  Date: 08/13/2020  DOB: 02-20-1951  QM:VHQIONG, Dibas, MD  Curt Bears, MD   REFERRING PHYSICIAN: Curt Bears, MD  DIAGNOSIS: The encounter diagnosis was Small cell lung carcinoma, right ([place]).    ICD-10-CM   1. Small cell lung carcinoma, right (HCC)  C34.91     HISTORY OF PRESENT ILLNESS: Tammy Bowers is a 69 y.o. female seen at the request of Dr. Julien Nordmann for progression of small cell lung cancer. She is well known to our service, having previously completed concurrent chemoradiation and PCI.  In summary, she patient initially presented in 2017 with SOB and cough, and work up of this revealed a right hilar mass. A CT chest 10/11/16 revealed a 5.7 x 4.8 x 6.1 cm right hilar mass causing mass effect on the distal right pulmonary artery and right lower lobe bronchus and causing right middle lobe collapse, and right precarinal adenopathy. A bronchoscopy with biopsy of the inter-medial bronchus on 10/19/16 confirmed small cell carcinoma.  PET scan for disease staging on 10/21/16 revealed a large hypermetabolic right upper lobe mass invading the right hilum and mediastinum and partially obstructing the right middle and lower lobe bronchi. There was also an indeterminate 6 mm right upper lobe pulmonary nodule but no findings for abdominal/pelvic metastatic disease or osseous metastatic disease. There was a benign-appearing right adrenal gland adenoma. Initial brain MRI on 10/26/16 was negative for metastatic disease but limited due to lack of contrast.  She was treated for limited stage (T2b, N2, M0) small cell lung cancer with concurrent chemoradiation which was completed in 01/2017 (radiation) and tolerated well overall. She completed 6 cycles of cisplatin and etoposide prior to proceeding in observation. A follow up 3T MRI brain on 04/05/17 confirmed no  disease in the brain so she proceeded to complete PCI in 04/2017.  She continued in observation under the care and direction of Dr. Julien Nordmann with restaging Chest CT scans showing no concerning findings for disease progression but there was a small nodule in the right lung that required close observation. Follow up imaging from 01/15/2019 showed development of 2 right lower lobe pulmonary nodules in addition to new mediastinal lymphadenopathy, suspicious for disease recurrence versus new primary.  This was further evaluated with PET imaging on 01/28/2019 with findings consistent with recurrent/metastatic disease within the right side of the mediastinum and right lower lobe but no evidence of extrathoracic hypermetabolic metastasis.  She underwent repeat bronchoscopy with EBUS and transbronchial biopsy of lymph nodes and bronchus intermedius with Dr. Servando Snare on 02/20/2019.  Final surgical pathology confirmed recurrence of the small cell lung cancer.    She was started on palliative systemic therapy with etoposide, carboplatin and Tecentriq with her first dose on March 05, 2019.  She completed 4 cycles with imaging confirming a partial response so she proceeded with maintenance single agent Tecentriq beginning with cycle #5 in June 2020.  She received cycles 8 through 11 while temporarily living in [place] but returned to [place], [place] in November 2020 to resume care.  Her follow-up imaging studies did not show any evidence for disease progression while in [place].  A follow-up disease restaging CT C/A/P on 02/14/2020 did show a new 4 mm pulmonary nodule in the right lower lobe warranting close observation.  She continued her Tecentriq maintenance therapy again in [place] from May 2021 through July 2021 under the care of  Dr. Lockie Pares at [facility] in [place].  She returned to [place], [place] in July 2021 to resume her care.  She did develop some altered mental status on  07/07/2020 while in [place] but a CTA of the head was without any acute abnormalities to explain her symptoms.  The symptoms spontaneously resolved and have not recurred.  Unfortunately, her most recent restaging CT C/A/P performed on 08/04/2020 showed enlargement of the right perihilar mass with obstruction of the right lower bronchus and complete collapse of the right lower lobe as well as a 3 mm posterior medial right upper lobe nodule.  Treatment options were discussed with Dr. Julien Nordmann at the time of her most recent follow-up visit on 08/05/2020 with the recommendation to continue with single agent Tecentriq every 3 weeks but she has been kindly referred back to [place] today for consideration of palliative radiotherapy to the enlarging, obstructing right perihilar mass.   PREVIOUS RADIATION THERAPY: Yes: 12/13/2016 to 01/31/2017:  The Right lung SCLC was treated to 59.4 Gy in 33 fractions at 1.8 Gy per fraction, concurrent with systemic chemotherapy.  04/11/2017 - 04/21/2017:  PCI//The whole brain was treated to 25 Gy in 10 fractions of 2.5 Gy.  PAST MEDICAL HISTORY:  Past Medical History:  Diagnosis Date  . Adenopathy 10/10/2016   PERICARINAL  . Anemia   . Arthritis   . Asthma   . Cancer ([place])    lung cancer Small Cell  . COPD (chronic obstructive pulmonary disease) ([place])   . Dyspnea   . Headache    migraines  . Hilar mass 10/10/2016   RIGHT  . History of kidney stones   . Insomnia   . Lung nodules 10/10/2016   RIGHT LOWER LOBE  . Mass of both adrenal glands ([place]) 10/10/2016  . Mood swing   . Pneumonia   . Spinal headache   . Substance abuse ([place])    TOBACCO  . UNSPECIFIED INFECTION OF BONE ANKLE AND FOOT 10/22/2009   Annotation: left ankle Qualifier: Diagnosis of  By: Gustavo Lah        PAST SURGICAL HISTORY: Past Surgical History:  Procedure Laterality Date  . ANKLE SURGERY     hardware removal, and initial ankle surgery  . APPENDECTOMY    . IR IMAGING GUIDED PORT  INSERTION  04/12/2019  . LUNG BIOPSY N/A 10/19/2016   Procedure: LUNG BIOPSY;  Surgeon: Grace Isaac, MD;  Location: [facility];  Service: Thoracic;  Laterality: N/A;  . LUNG BIOPSY N/A 02/20/2019   Procedure: LUNG BIOPSY;  Surgeon: Grace Isaac, MD;  Location: [facility];  Service: Thoracic;  Laterality: N/A;  . TONSILLECTOMY    . VIDEO BRONCHOSCOPY WITH ENDOBRONCHIAL ULTRASOUND N/A 10/19/2016   Procedure: VIDEO BRONCHOSCOPY WITH ENDOBRONCHIAL ULTRASOUND;  Surgeon: Grace Isaac, MD;  Location: [facility];  Service: Thoracic;  Laterality: N/A;  . VIDEO BRONCHOSCOPY WITH ENDOBRONCHIAL ULTRASOUND N/A 02/20/2019   Procedure: VIDEO BRONCHOSCOPY WITH ENDOBRONCHIAL ULTRASOUND;  Surgeon: Grace Isaac, MD;  Location: [facility];  Service: Thoracic;  Laterality: N/A;    FAMILY HISTORY:  Family History  Problem Relation Age of Onset  . Heart disease Father   . Colon cancer Father   . Heart disease Paternal Grandfather   . Ovarian cancer Mother     SOCIAL HISTORY:  Social History   Socioeconomic History  . Marital status: Married    Spouse name: Not on file  . Number of children: 2  . Years  of education: Not on file  . Highest education level: Not on file  Occupational History  . Occupation: retired  Tobacco Use  . Smoking status: Current Every Day Smoker    Packs/day: 1.00    Years: 50.00    Pack years: 50.00    Types: Cigarettes    Last attempt to quit: 08/02/2016    Years since quitting: 4.0  . Smokeless tobacco: Never Used  Vaping Use  . Vaping Use: Never used  Substance and Sexual Activity  . Alcohol use: Yes    Alcohol/week: 7.0 standard drinks    Types: 7 Shots of liquor per week  . Drug use: Not Currently    Types: Marijuana    Comment: August/Sept 2017 last ttime  . Sexual activity: Yes    Partners: Male    Birth control/protection: Post-menopausal  Other Topics Concern  . Not on file  Social History Narrative  . Not on file   Social Determinants of Health    Financial Resource Strain:   . Difficulty of Paying Living Expenses: Not on file  Food Insecurity:   . Worried About Charity fundraiser in the Last Year: Not on file  . Ran Out of Food in the Last Year: Not on file  Transportation Needs:   . Lack of Transportation (Medical): Not on file  . Lack of Transportation (Non-Medical): Not on file  Physical Activity:   . Days of Exercise per Week: Not on file  . Minutes of Exercise per Session: Not on file  Stress:   . Feeling of Stress : Not on file  Social Connections:   . Frequency of Communication with Friends and Family: Not on file  . Frequency of Social Gatherings with Friends and Family: Not on file  . Attends Religious Services: Not on file  . Active Member of Clubs or Organizations: Not on file  . Attends Archivist Meetings: Not on file  . Marital Status: Not on file  Intimate Partner Violence:   . Fear of Current or Ex-Partner: Not on file  . Emotionally Abused: Not on file  . Physically Abused: Not on file  . Sexually Abused: Not on file  The patient is married and resides in [place]. She is accompanied by her husband who previously was a patient of Dr. Johny Shears as well.   ALLERGIES: Lidocaine  MEDICATIONS:  Current Outpatient Medications  Medication Sig Dispense Refill  . albuterol (PROVENTIL HFA;VENTOLIN HFA) 108 (90 BASE) MCG/ACT inhaler Inhale 2 puffs into the lungs every 4 (four) hours as needed for wheezing.    . Cyclobenzaprine HCl (FLEXERIL PO) Take 5 mg by mouth 3 (three) times daily as needed (muscle spasms).     . diphenoxylate-atropine (LOMOTIL) 2.5-0.025 MG tablet Take 1 tablet by mouth 4 (four) times daily as needed for diarrhea or loose stools. 30 tablet 0  . Fluticasone-Salmeterol (ADVAIR) 500-50 MCG/DOSE AEPB Inhale 1 puff into the lungs 2 (two) times daily.    Marland Kitchen ibuprofen (ADVIL) 800 MG tablet Take 800 mg by mouth every 8 (eight) hours as needed.    Marland Kitchen levothyroxine (SYNTHROID) 100 MCG  tablet Take 1 tablet (100 mcg total) by mouth daily. 30 tablet 1  . LORazepam (ATIVAN) 0.5 MG tablet Take 1 tablet (0.5 mg total) by mouth at bedtime. 20 tablet 0  . Multiple Vitamin (MULTIVITAMIN WITH MINERALS) TABS tablet Take 1 tablet by mouth daily.  (Patient not taking: Reported on 07/16/2020)    . prochlorperazine (COMPAZINE) 10 MG  tablet TAKE 1 TABLET(10 MG) BY MOUTH EVERY 6 HOURS AS NEEDED FOR NAUSEA OR VOMITING 30 tablet 0   No current facility-administered medications for this encounter.    REVIEW OF SYSTEMS:  On review of systems, the patient reports that she is doing well overall. She denies any chest pain, fevers, chills, night sweats. She reports fatigue and persistent shortness of breath and dry cough which is slightly worse over the past 1-2 months but denies hemoptysis or productive cough. She denies any bowel or bladder disturbances, and denies abdominal pain, nausea or vomiting. She denies any new musculoskeletal or joint aches or pains. She suffers from migraines. She takes multivitamins, fish oil, and MSM. A complete review of systems is obtained and is otherwise negative.   PHYSICAL EXAM:  Wt Readings from Last 3 Encounters:  08/05/20 209 lb 9.6 oz (95.1 kg)  07/16/20 (!) 209 lb 11.2 oz (95.1 kg)  04/20/20 204 lb 1.6 oz (92.6 kg)   Temp Readings from Last 3 Encounters:  08/05/20 (!) 97 F (36.1 C) (Tympanic)  07/16/20 98.1 F (36.7 C) (Temporal)  04/20/20 98.9 F (37.2 C) (Temporal)   BP Readings from Last 3 Encounters:  08/05/20 122/89  07/16/20 116/78  04/20/20 106/89   Pulse Readings from Last 3 Encounters:  08/05/20 89  07/16/20 92  04/20/20 99    In general this is a well appearing Caucasian female in no acute distress. She is alert and oriented x4 and appropriate throughout the examination. HEENT reveals that the patient is normocephalic, atraumatic. EOMs are intact. PERRLA. Skin is intact without any evidence of gross lesions. Cardiovascular exam reveals  a regular rate and rhythm, no clicks rubs or murmurs are auscultated. Chest is clear to auscultation bilaterally. Lymphatic assessment is performed and does not reveal any adenopathy in the cervical, supraclavicular, axillary, or inguinal chains. Abdomen has active bowel sounds in all quadrants and is intact. The abdomen is soft, non tender, non distended. Lower extremities are negative for pretibial pitting edema, deep calf tenderness, cyanosis or clubbing.   KPS = 80  100 - Normal; no complaints; no evidence of disease. 90   - Able to carry on normal activity; minor signs or symptoms of disease. 80   - Normal activity with effort; some signs or symptoms of disease. 98   - Cares for self; unable to carry on normal activity or to do active work. 60   - Requires occasional assistance, but is able to care for most of his personal needs. 50   - Requires considerable assistance and frequent medical care. 90   - Disabled; requires special care and assistance. 71   - Severely disabled; hospital admission is indicated although death not imminent. 30   - Very sick; hospital admission necessary; active supportive treatment necessary. 10   - Moribund; fatal processes progressing rapidly. 0     - Dead  Karnofsky DA, Abelmann [place], Craver LS and Burchenal [facility] [phone]) The use of the nitrogen mustards in the palliative treatment of carcinoma: with particular reference to bronchogenic carcinoma Cancer 1 [id number]  LABORATORY DATA:  Lab Results  Component Value Date   WBC 7.4 08/05/2020   HGB 13.1 08/05/2020   HCT 39.1 08/05/2020   MCV 91.6 08/05/2020   PLT 301 08/05/2020   Lab Results  Component Value Date   NA 140 08/05/2020   K 3.7 08/05/2020   CL 104 08/05/2020   CO2 27 08/05/2020   Lab Results  Component Value Date  ALT 6 08/05/2020   AST 13 (L) 08/05/2020   ALKPHOS 114 08/05/2020   BILITOT 0.3 08/05/2020     RADIOGRAPHY: CT Chest W Contrast  Result Date: 08/05/2020 CLINICAL DATA:   Primary Cancer Type: Extensive stage small cell lung cancer. Imaging Indication: Routine surveillance Interval therapy since last imaging? Yes Initial Cancer Diagnosis Date: 10/19/2016; Established by: Biopsy-proven Detailed Pathology: Recurrent small cell lung cancer initially diagnosed as Limited stage small cell lung cancer. Primary Tumor location: Initial: right hilar mass with mediastinal invasion and a right lower lobe pulmonary nodule. Recurrence: right lower lobe and mediastinal lymph nodes. Recurrence? Yes; Date(s) of recurrence: 02/20/2019; Established by: Biopsy-proven Surgeries: No thoracic.  Appendectomy. Chemotherapy: Yes; Ongoing?  No; Most recent administration: 05/2019 Immunotherapy?  Yes; Type: Tecentriq; Ongoing? Yes Radiation therapy? Yes Date Range: 04/11/2017 - 04/21/2017; Target: Prophylactic brain irradiation Date Range: 12/13/2016 - 01/31/2017; Target: Right lung EXAM: CT CHEST, ABDOMEN, AND PELVIS WITH CONTRAST TECHNIQUE: Multidetector CT imaging of the chest, abdomen and pelvis was performed following the standard protocol during bolus administration of intravenous contrast. CONTRAST:  138mL OMNIPAQUE IOHEXOL 300 MG/ML  SOLN COMPARISON:  Most recent CT chest, abdomen and pelvis 04/30/2020. 01/28/2019 PET-CT. FINDINGS: CT CHEST FINDINGS Cardiovascular: Right IJ power port terminates in the SVC. Atherosclerotic calcification of the aorta. Heart size normal. No pericardial effusion. Mediastinum/Nodes: AP window lymph node has increased in size, now measuring 9 mm, previously 4 mm. Subcarinal lymph node is stable, 1.3 cm. No left hilar or axillary adenopathy. Esophagus is unremarkable. Lungs/Pleura: Post radiation volume loss in the medial aspect of the right hemithorax. Necrotic appearing mass in the right perihilar/subcarinal region has enlarged, measuring 2.1 x 2.9 cm (3/30), compared to 1.1 x 1.3 cm on 04/30/2020. Associated obstruction of the right lower lobe bronchus with fluid-filled  right lower lobe bronchi and complete collapse of the right lower lobe. Small right pleural effusion is new. 3 mm medial posterior segment right upper lobe nodule (4/68), possibly new. Mild centrilobular and paraseptal emphysema. Irregularity of right upper and right middle lobe bronchi. Airway is otherwise unremarkable. Musculoskeletal: No worrisome lytic or sclerotic lesions. CT ABDOMEN PELVIS FINDINGS Hepatobiliary: Liver and gallbladder are unremarkable. No biliary ductal dilatation. Pancreas: Negative. Spleen: Negative. Adrenals/Urinary Tract: Heterogeneous right adrenal mass is stable in size, 3.0 x 3.4 cm. Left adrenal gland and right kidney are unremarkable. Subcentimeter low-attenuation lesion in the interpolar left kidney is too small to characterize but statistically, a cyst is likely. Ureters are decompressed. Bladder is unremarkable. Stomach/Bowel: Small hiatal hernia. Stomach, small bowel and colon are unremarkable. Appendix is not readily visualized. Vascular/Lymphatic: Atherosclerotic calcification of the aorta without aneurysm. No pathologically enlarged lymph nodes. Reproductive: Uterus is visualized.  No adnexal mass. Other: No free fluid.  Mesenteries and peritoneum are unremarkable. Musculoskeletal: No worrisome lytic or sclerotic lesions. IMPRESSION: 1. Enlarging right perihilar mass with obstruction of the right lower lobe bronchus and complete collapse of the right lower lobe. Associated small right pleural effusion. 2. 3 mm posteromedial right upper lobe nodule, possibly new. Attention on follow-up is recommended. 3. Stable right adrenal previously characterized as an adenoma. 4.  Aortic atherosclerosis (ICD10-I70.0). 5.  Emphysema (ICD10-J43.9). Electronically Signed   By: Lorin Picket M.D.   On: 08/05/2020 10:01   CT Abdomen Pelvis W Contrast  Result Date: 08/05/2020 CLINICAL DATA:  Primary Cancer Type: Extensive stage small cell lung cancer. Imaging Indication: Routine surveillance  Interval therapy since last imaging? Yes Initial Cancer Diagnosis Date: 10/19/2016; Established by: Biopsy-proven Detailed  Pathology: Recurrent small cell lung cancer initially diagnosed as Limited stage small cell lung cancer. Primary Tumor location: Initial: right hilar mass with mediastinal invasion and a right lower lobe pulmonary nodule. Recurrence: right lower lobe and mediastinal lymph nodes. Recurrence? Yes; Date(s) of recurrence: 02/20/2019; Established by: Biopsy-proven Surgeries: No thoracic.  Appendectomy. Chemotherapy: Yes; Ongoing?  No; Most recent administration: 05/2019 Immunotherapy?  Yes; Type: Tecentriq; Ongoing? Yes Radiation therapy? Yes Date Range: 04/11/2017 - 04/21/2017; Target: Prophylactic brain irradiation Date Range: 12/13/2016 - 01/31/2017; Target: Right lung EXAM: CT CHEST, ABDOMEN, AND PELVIS WITH CONTRAST TECHNIQUE: Multidetector CT imaging of the chest, abdomen and pelvis was performed following the standard protocol during bolus administration of intravenous contrast. CONTRAST:  137mL OMNIPAQUE IOHEXOL 300 MG/ML  SOLN COMPARISON:  Most recent CT chest, abdomen and pelvis 04/30/2020. 01/28/2019 PET-CT. FINDINGS: CT CHEST FINDINGS Cardiovascular: Right IJ power port terminates in the SVC. Atherosclerotic calcification of the aorta. Heart size normal. No pericardial effusion. Mediastinum/Nodes: AP window lymph node has increased in size, now measuring 9 mm, previously 4 mm. Subcarinal lymph node is stable, 1.3 cm. No left hilar or axillary adenopathy. Esophagus is unremarkable. Lungs/Pleura: Post radiation volume loss in the medial aspect of the right hemithorax. Necrotic appearing mass in the right perihilar/subcarinal region has enlarged, measuring 2.1 x 2.9 cm (3/30), compared to 1.1 x 1.3 cm on 04/30/2020. Associated obstruction of the right lower lobe bronchus with fluid-filled right lower lobe bronchi and complete collapse of the right lower lobe. Small right pleural effusion is  new. 3 mm medial posterior segment right upper lobe nodule (4/68), possibly new. Mild centrilobular and paraseptal emphysema. Irregularity of right upper and right middle lobe bronchi. Airway is otherwise unremarkable. Musculoskeletal: No worrisome lytic or sclerotic lesions. CT ABDOMEN PELVIS FINDINGS Hepatobiliary: Liver and gallbladder are unremarkable. No biliary ductal dilatation. Pancreas: Negative. Spleen: Negative. Adrenals/Urinary Tract: Heterogeneous right adrenal mass is stable in size, 3.0 x 3.4 cm. Left adrenal gland and right kidney are unremarkable. Subcentimeter low-attenuation lesion in the interpolar left kidney is too small to characterize but statistically, a cyst is likely. Ureters are decompressed. Bladder is unremarkable. Stomach/Bowel: Small hiatal hernia. Stomach, small bowel and colon are unremarkable. Appendix is not readily visualized. Vascular/Lymphatic: Atherosclerotic calcification of the aorta without aneurysm. No pathologically enlarged lymph nodes. Reproductive: Uterus is visualized.  No adnexal mass. Other: No free fluid.  Mesenteries and peritoneum are unremarkable. Musculoskeletal: No worrisome lytic or sclerotic lesions. IMPRESSION: 1. Enlarging right perihilar mass with obstruction of the right lower lobe bronchus and complete collapse of the right lower lobe. Associated small right pleural effusion. 2. 3 mm posteromedial right upper lobe nodule, possibly new. Attention on follow-up is recommended. 3. Stable right adrenal previously characterized as an adenoma. 4.  Aortic atherosclerosis (ICD10-I70.0). 5.  Emphysema (ICD10-J43.9). Electronically Signed   By: Lorin Picket M.D.   On: 08/05/2020 10:01      IMPRESSION/PLAN: 1. 69 y.o. female with recurrent small cell lung cancer with an enlarging right perihilar mass causing obstruction of the right lower lobe. Today, we talked to the patient and family about the findings and workup thus far. We discussed the natural  history of recurrent small cell carcinoma and general treatment, highlighting the role of palliative radiotherapy in the management. We discussed the available radiation techniques, and focused on the details and logistics of delivery. Dr. Tammi Klippel has personally reviewed her most recent CT imaging as well as her previous treatment planning and feels that we can safely deliver further  radiation to the enlarging, obstructing right perihilar mass with a 2.5-week course of daily radiotherapy concurrent with her Tecentriq immunotherapy. We reviewed the anticipated acute and late sequelae associated with re-irradiation in this setting. The patient was encouraged to ask questions that were answered to her stated satisfaction.  At the conclusion of our conversation, the patient elects to proceed with the recommended 2.5-week course of daily palliative radiotherapy delivered in 14 fractions to the enlarging right perihilar mass. She appears to have a good understanding of her disease and our treatment recommendations which are of palliative intent and is comfortable with and in agreement with the stated plan. She will proceed with CT simulation/treatment planning following our visit today in anticipation of beginning her daily treatments on Monday, 08/17/2020. We will share our discussion with Dr. Julien Nordmann and move forward with treatment planning accordingly.    Nicholos Johns, PA-C    [name], MD  [facility] Direct Dial: [phone]  Fax: [phone] [email or web address]  Skype  [email or web address]

## 2020-08-16 NOTE — Addendum Note (Signed)
Encounter addended by: Freeman Caldron, PA-C on: 08/16/2020 1:20 PM  Actions taken: Clinical Note Signed, Level of Service modified

## 2020-08-17 ENCOUNTER — Ambulatory Visit
Admission: RE | Admit: 2020-08-17 | Discharge: 2020-08-17 | Disposition: A | Discharge status: Home / Self Care | Payer: Medicare Other | Source: Ambulatory Visit | Attending: Radiation Oncology | Admitting: Radiation Oncology

## 2020-08-17 DIAGNOSIS — Z510 Encounter for antineoplastic radiation therapy: Secondary | ICD-10-CM | POA: Diagnosis not present

## 2020-08-17 DIAGNOSIS — C3491 Malignant neoplasm of unspecified part of right bronchus or lung: Secondary | ICD-10-CM

## 2020-08-17 DIAGNOSIS — C3401 Malignant neoplasm of right main bronchus: Secondary | ICD-10-CM | POA: Diagnosis not present

## 2020-08-17 DIAGNOSIS — C3411 Malignant neoplasm of upper lobe, right bronchus or lung: Secondary | ICD-10-CM | POA: Diagnosis not present

## 2020-08-18 ENCOUNTER — Other Ambulatory Visit: Payer: Self-pay

## 2020-08-18 ENCOUNTER — Ambulatory Visit
Admission: RE | Admit: 2020-08-18 | Discharge: 2020-08-18 | Disposition: A | Discharge status: Home / Self Care | Payer: Medicare Other | Source: Ambulatory Visit | Attending: Radiation Oncology | Admitting: Radiation Oncology

## 2020-08-18 DIAGNOSIS — C3411 Malignant neoplasm of upper lobe, right bronchus or lung: Secondary | ICD-10-CM | POA: Diagnosis not present

## 2020-08-18 DIAGNOSIS — C3401 Malignant neoplasm of right main bronchus: Secondary | ICD-10-CM | POA: Diagnosis not present

## 2020-08-18 DIAGNOSIS — Z510 Encounter for antineoplastic radiation therapy: Secondary | ICD-10-CM | POA: Diagnosis not present

## 2020-08-19 ENCOUNTER — Ambulatory Visit
Admission: RE | Admit: 2020-08-19 | Discharge: 2020-08-19 | Disposition: A | Discharge status: Home / Self Care | Payer: Medicare Other | Source: Ambulatory Visit | Attending: Radiation Oncology | Admitting: Radiation Oncology

## 2020-08-19 DIAGNOSIS — Z510 Encounter for antineoplastic radiation therapy: Secondary | ICD-10-CM | POA: Insufficient documentation

## 2020-08-19 DIAGNOSIS — C3411 Malignant neoplasm of upper lobe, right bronchus or lung: Secondary | ICD-10-CM | POA: Insufficient documentation

## 2020-08-19 DIAGNOSIS — C3401 Malignant neoplasm of right main bronchus: Secondary | ICD-10-CM | POA: Diagnosis not present

## 2020-08-19 IMAGING — MR MR HEAD WO/W CM
10 of 13 series · 36 of 48 positions shown · IV contrast (Yes)
Comparison: 04/05/2017

CLINICAL DATA: Small-cell lung cancer. History of prophylactic
cranial irradiation.

EXAM:
MRI HEAD WITHOUT AND WITH CONTRAST
TECHNIQUE: Multiplanar, multiecho pulse sequences of the brain and surrounding
structures were obtained without and with intravenous contrast.
CONTRAST:  10 mL Gadavist

[Series 3: DWI · axial · 3.0mm · 1.09mm/px · z∈[-26,+135]mm · 9 of 110 slices shown (1 of 4)]
[im 1/110]
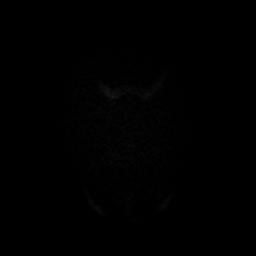
[im 14/110]
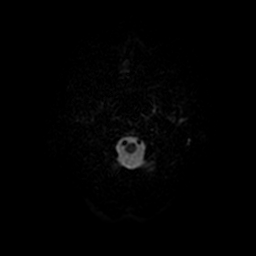
[im 28/110]
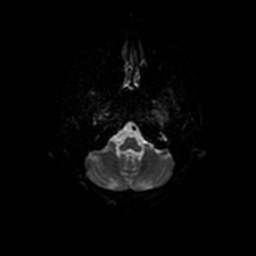
[im 41/110]
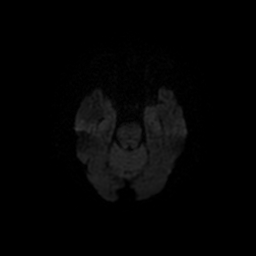
[im 55/110]
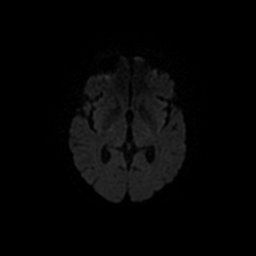
[im 69/110]
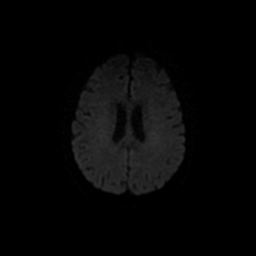
[im 82/110]
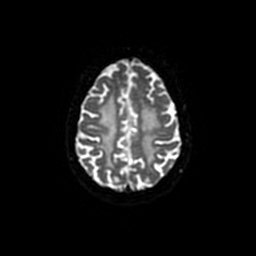
[im 96/110]
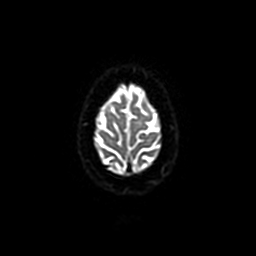
[im 110/110]
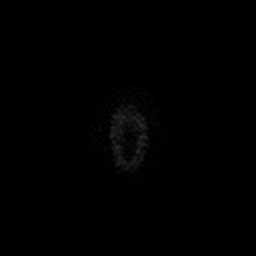

[Series 4: T1 · sagittal · 5.0mm · 0.47mm/px · 2 of 24 slices shown]
[im 1/24]
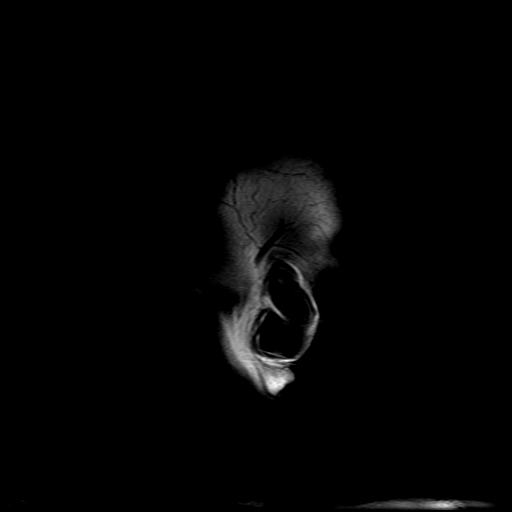
[im 24/24]
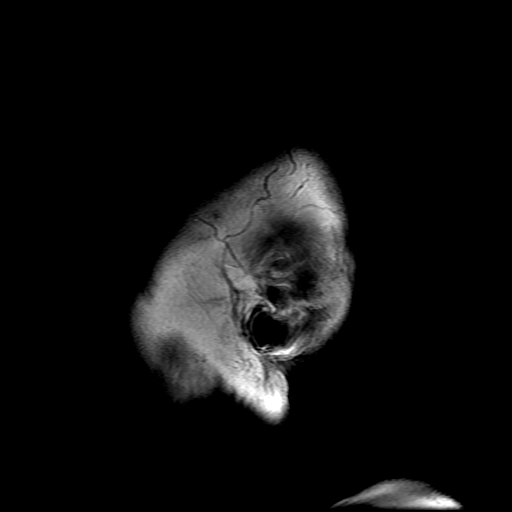

[Series 5: DWI · coronal · 4.0mm · 1.09mm/px · 7 of 88 slices shown (2 of 4)]
[im 1/88]
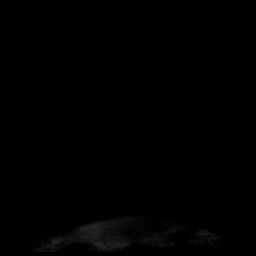
[im 15/88]
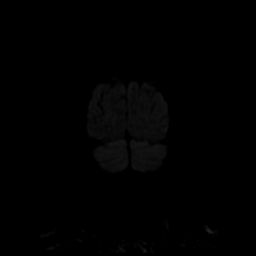
[im 30/88]
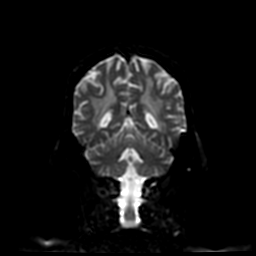
[im 44/88]
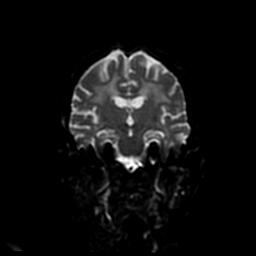
[im 59/88]
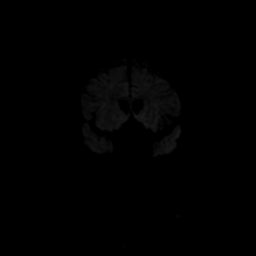
[im 73/88]
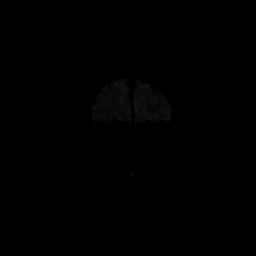
[im 88/88]
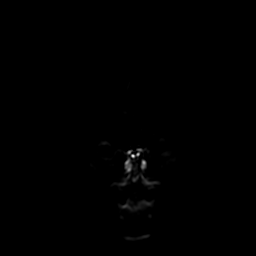

[Series 6: T2 · axial · 5.0mm · 0.43mm/px · z∈[-26,+136]mm · 2 of 28 slices shown]
[im 1/28]
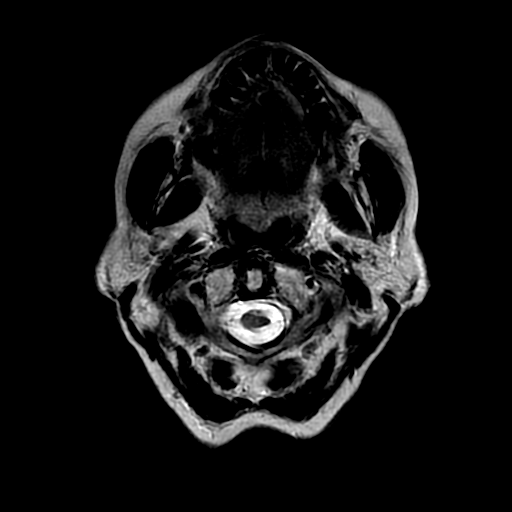
[im 28/28]
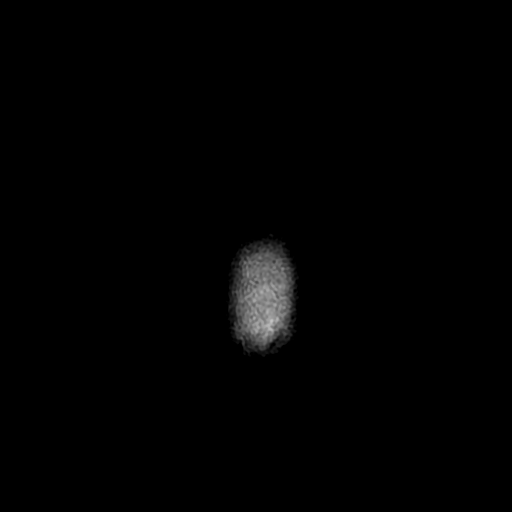

[Series 7: FLAIR · axial · 3.0mm · 0.43mm/px · z∈[-26,+136]mm · 2 of 28 slices shown]
[im 1/28]
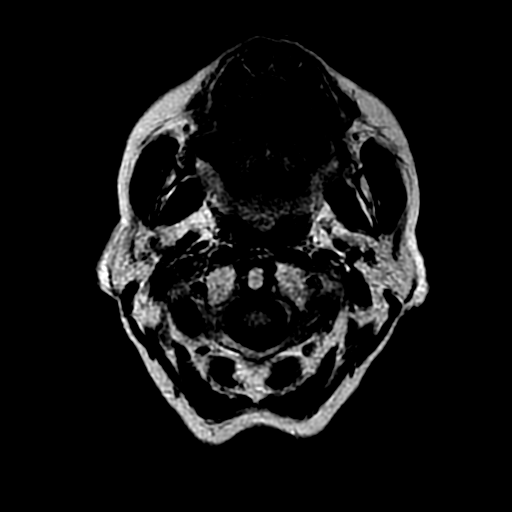
[im 28/28]
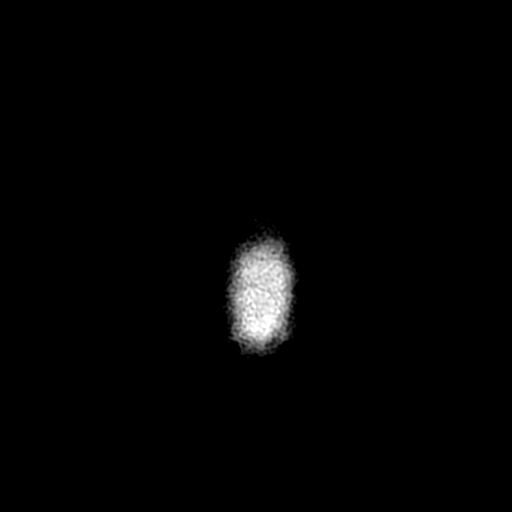

[Series 10: T2 post-contrast · coronal · 5.0mm · 0.45mm/px · 2 of 26 slices shown]
[im 1/26]
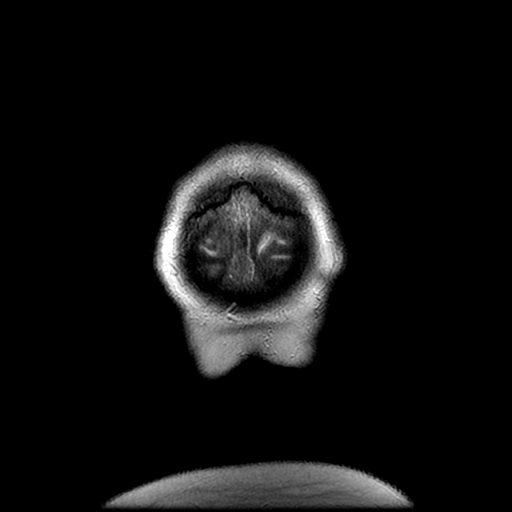
[im 26/26]
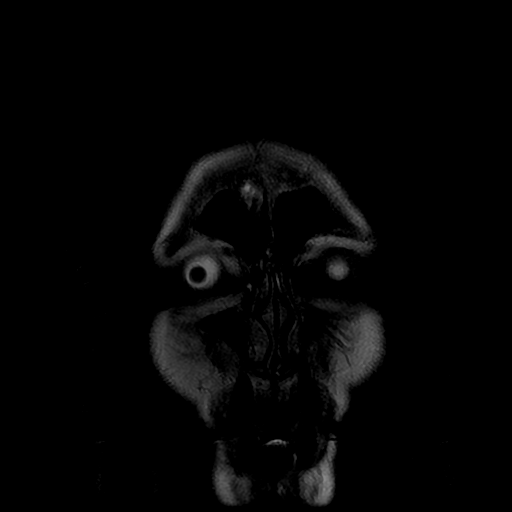

[Series 12: T1 post-contrast · coronal · 5.0mm · 0.45mm/px · 2 of 26 slices shown (1 of 2)]
[im 1/26]
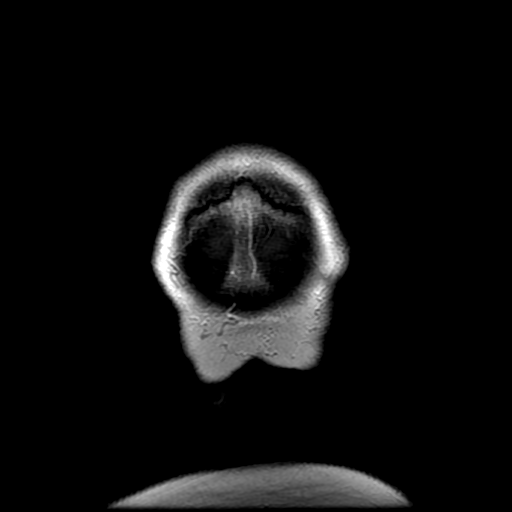
[im 26/26]
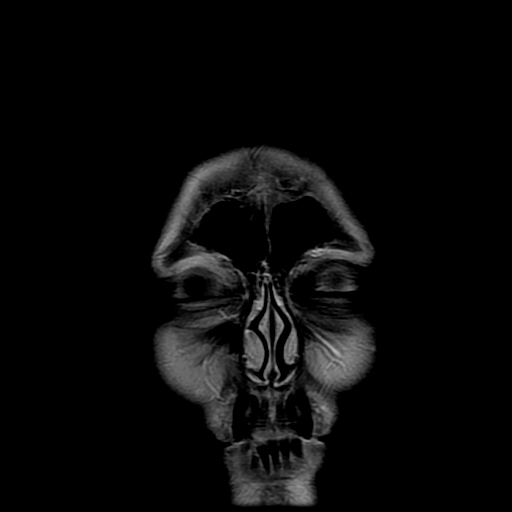

[Series 13: T1 post-contrast · sagittal · 5.0mm · 0.47mm/px · 2 of 24 slices shown (2 of 2)]
[im 1/24]
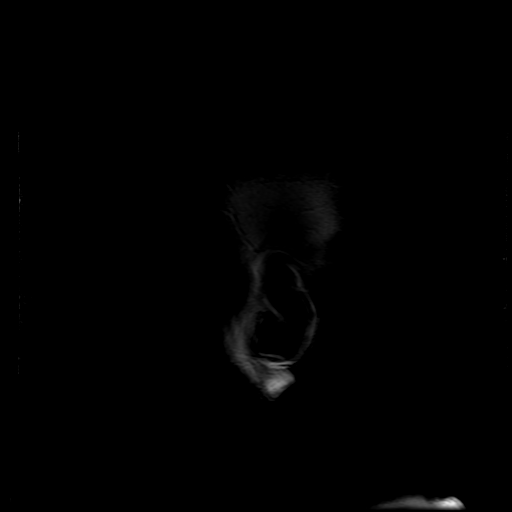
[im 24/24]
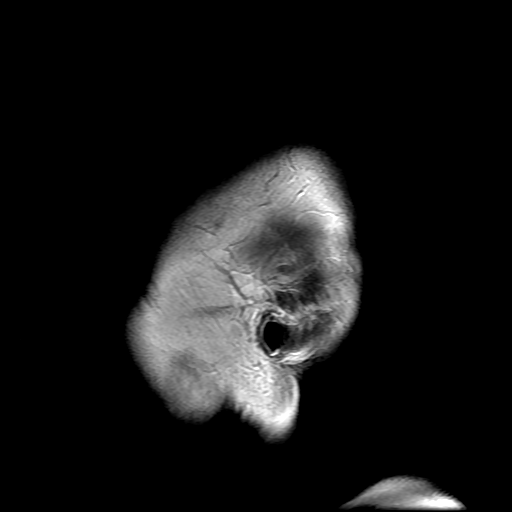

[Series 300: DWI · axial · 3.0mm · 1.09mm/px · z∈[-26,+135]mm · 4 of 55 slices shown (3 of 4)]
[im 1/55]
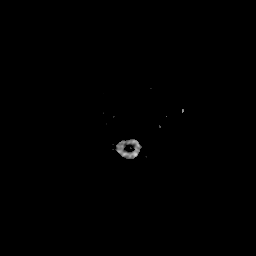
[im 19/55]
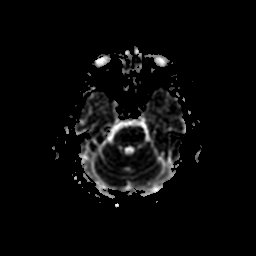
[im 37/55]
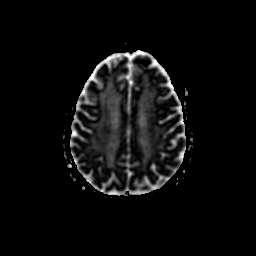
[im 55/55]
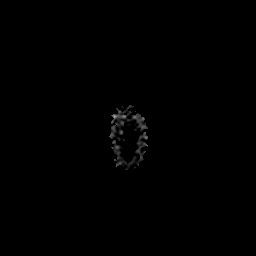

[Series 500: DWI · coronal · 4.0mm · 1.09mm/px · 4 of 44 slices shown (4 of 4)]
[im 1/44]
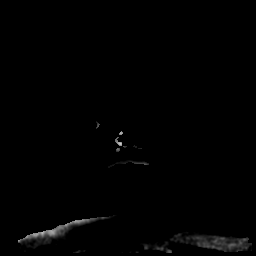
[im 15/44]
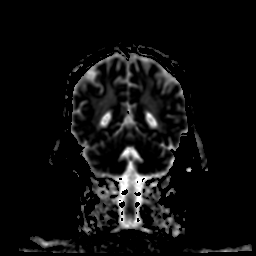
[im 29/44]
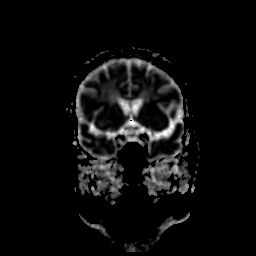
[im 44/44]
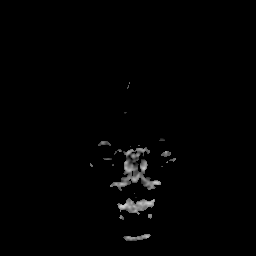

[36 of 48 positions shown; findings below may reference images not displayed]

FINDINGS: Brain: There is no evidence of acute infarct, intracranial
hemorrhage, mass, midline shift, or extra-axial fluid collection.
There is progressive, now confluent T2 hyperintensity in the
cerebral white matter bilaterally. No abnormal enhancement is
identified. There is mild cerebral atrophy.

Vascular: Major intracranial vascular flow voids are preserved.

Skull and upper cervical spine: Unremarkable bone marrow signal.

Sinuses/Orbits: Unremarkable orbits. Paranasal sinuses and mastoid
air cells are clear.

Other: None.
IMPRESSION: 1. No evidence of intracranial metastases.
2. Progressive cerebral white matter changes consistent with
interval cranial irradiation.

## 2020-08-20 ENCOUNTER — Ambulatory Visit
Admission: RE | Admit: 2020-08-20 | Discharge: 2020-08-20 | Disposition: A | Discharge status: Home / Self Care | Payer: Medicare Other | Source: Ambulatory Visit | Attending: Radiation Oncology | Admitting: Radiation Oncology

## 2020-08-20 DIAGNOSIS — C3411 Malignant neoplasm of upper lobe, right bronchus or lung: Secondary | ICD-10-CM | POA: Diagnosis not present

## 2020-08-20 DIAGNOSIS — Z510 Encounter for antineoplastic radiation therapy: Secondary | ICD-10-CM | POA: Diagnosis not present

## 2020-08-20 DIAGNOSIS — C3401 Malignant neoplasm of right main bronchus: Secondary | ICD-10-CM | POA: Diagnosis not present

## 2020-08-21 ENCOUNTER — Ambulatory Visit
Admission: RE | Admit: 2020-08-21 | Discharge: 2020-08-21 | Disposition: A | Discharge status: Home / Self Care | Payer: Medicare Other | Source: Ambulatory Visit | Attending: Radiation Oncology | Admitting: Radiation Oncology

## 2020-08-21 ENCOUNTER — Other Ambulatory Visit: Payer: Self-pay

## 2020-08-21 DIAGNOSIS — Z510 Encounter for antineoplastic radiation therapy: Secondary | ICD-10-CM | POA: Diagnosis not present

## 2020-08-21 DIAGNOSIS — C3411 Malignant neoplasm of upper lobe, right bronchus or lung: Secondary | ICD-10-CM | POA: Diagnosis not present

## 2020-08-21 DIAGNOSIS — C3401 Malignant neoplasm of right main bronchus: Secondary | ICD-10-CM | POA: Diagnosis not present

## 2020-08-21 NOTE — Progress Notes (Deleted)
[facility] OFFICE PROGRESS NOTE  Koirala, Dibas, MD [address] [address]  DIAGNOSIS: Recurrent small cell lung cancer initially diagnosed as Limited stage (T2b, N2, M0) small cell lung cancer diagnosed in November 2017 and presented with large right hilar mass with mediastinal invasion and a right lower lobe pulmonary nodule. The patient had disease recurrence in the right lower lobe and mediastinal lymph nodes in February 2020.  PRIOR THERAPY: 1) Systemic chemotherapy with cisplatin 60 MG/M2 on day 1 and etoposide 120 MG/M2 on days 1, 2 and 3 concurrent with radiation status post 6 cycles. Last dose was giving March 06, 2017. 2) status post prophylactic cranial irradiation.  CURRENT THERAPY: 1) Systemic chemotherapy with carboplatin for AUC of 5 on day 1, etoposide. 100 mg/M2 on days 1, 2 and 3 as well as Tecentriq 1200 mg IV with Neulasta support every 3 weeks. First dose March 05, 2019. Starting from cycle 5, the patient will be treated with single agent Tecentriq 1200 mg IV every 3 weeks. First dose May 28, 2019. Status post 17 cycles of tecentriq performed at our clinic.  2) palliative radiotherapy to the enlarging right perihilar mass under the care of Dr. Tammi Klippel.  Last dose expected on 09/04/2020.  INTERVAL HISTORY: Tammy Bowers 69 y.o. female returns to the clinic today for follow-up visit.  The patient is 9_today without any concerning complaints except for _.  The patient's most recent restaging CT scan showed evidence of an enlarging right perihilar mass with obstruction of the right lower lobe bronchus and collapse of the right lower lobe.  There is also a 3 mm posterior medial right upper lobe nodule.  Dr. Earlie Server recommended that the patient be referred to radiation.  The patient is currently undergoing radiotherapy under the care of Dr. Tammi Klippel to this area.  She is expected to complete her course of radiation on  09/04/2020.  Otherwise, the patient is feeling fairly well today.  She denies any concerning complaints except for some fatigue.  She denies any fever, chills, night sweats, or weight loss.  She denies any hemoptysis.  She reports dyspnea on exertion and a cough.  She also notes some discomfort in her right breast/neck when she coughs.  She currently continues to smoke cigarettes and smokes approximately 6 to 10 cigarettes/day.  She denies any nausea, vomiting, diarrhea, or constipation.  She denies any headache or visual changes.  She denies any rashes or skin changes.  She is here today for evaluation before starting cycle #18 of immunotherapy with Tecentriq.  MEDICAL HISTORY: Past Medical History:  Diagnosis Date  . Adenopathy 10/10/2016   PERICARINAL  . Anemia   . Arthritis   . Asthma   . Cancer ([facility] [facility])    lung cancer Small Cell  . COPD (chronic obstructive pulmonary disease) ([facility])   . Dyspnea   . Headache    migraines  . Hilar mass 10/10/2016   RIGHT  . History of kidney stones   . Insomnia   . Lung nodules 10/10/2016   RIGHT LOWER LOBE  . Mass of both adrenal glands ([facility]) 10/10/2016  . Mood swing   . Pneumonia   . Spinal headache   . Substance abuse ([place])    TOBACCO  . UNSPECIFIED INFECTION OF BONE ANKLE AND FOOT 10/22/2009   Annotation: left ankle Qualifier: Diagnosis of  By: Patsy Baltimore RN, Denise      ALLERGIES:  is allergic to lidocaine.  MEDICATIONS:  Current Outpatient  Medications  Medication Sig Dispense Refill  . albuterol (PROVENTIL HFA;VENTOLIN HFA) 108 (90 BASE) MCG/ACT inhaler Inhale 2 puffs into the lungs every 4 (four) hours as needed for wheezing.    . Cyclobenzaprine HCl (FLEXERIL PO) Take 5 mg by mouth 3 (three) times daily as needed (muscle spasms).     . diphenoxylate-atropine (LOMOTIL) 2.5-0.025 MG tablet Take 1 tablet by mouth 4 (four) times daily as needed for diarrhea or loose stools. 30 tablet 0  . Fluticasone-Salmeterol (ADVAIR) 500-50 MCG/DOSE AEPB  Inhale 1 puff into the lungs 2 (two) times daily.    Marland Kitchen ibuprofen (ADVIL) 800 MG tablet Take 800 mg by mouth every 8 (eight) hours as needed.    Marland Kitchen levothyroxine (SYNTHROID) 100 MCG tablet Take 1 tablet (100 mcg total) by mouth daily. 30 tablet 1  . LORazepam (ATIVAN) 0.5 MG tablet Take 1 tablet (0.5 mg total) by mouth at bedtime. 20 tablet 0  . Multiple Vitamin (MULTIVITAMIN WITH MINERALS) TABS tablet Take 1 tablet by mouth daily.  (Patient not taking: Reported on 07/16/2020)    . prochlorperazine (COMPAZINE) 10 MG tablet TAKE 1 TABLET(10 MG) BY MOUTH EVERY 6 HOURS AS NEEDED FOR NAUSEA OR VOMITING 30 tablet 0   No current facility-administered medications for this visit.    SURGICAL HISTORY:  Past Surgical History:  Procedure Laterality Date  . ANKLE SURGERY     hardware removal, and initial ankle surgery  . APPENDECTOMY    . IR IMAGING GUIDED PORT INSERTION  04/12/2019  . LUNG BIOPSY N/A 10/19/2016   Procedure: LUNG BIOPSY;  Surgeon: Grace Isaac, MD;  Location: [facility];  Service: Thoracic;  Laterality: N/A;  . LUNG BIOPSY N/A 02/20/2019   Procedure: LUNG BIOPSY;  Surgeon: Grace Isaac, MD;  Location: [facility];  Service: Thoracic;  Laterality: N/A;  . TONSILLECTOMY    . VIDEO BRONCHOSCOPY WITH ENDOBRONCHIAL ULTRASOUND N/A 10/19/2016   Procedure: VIDEO BRONCHOSCOPY WITH ENDOBRONCHIAL ULTRASOUND;  Surgeon: Grace Isaac, MD;  Location: [facility];  Service: Thoracic;  Laterality: N/A;  . VIDEO BRONCHOSCOPY WITH ENDOBRONCHIAL ULTRASOUND N/A 02/20/2019   Procedure: VIDEO BRONCHOSCOPY WITH ENDOBRONCHIAL ULTRASOUND;  Surgeon: Grace Isaac, MD;  Location: [facility];  Service: Thoracic;  Laterality: N/A;    REVIEW OF SYSTEMS:   Review of Systems  Constitutional: Negative for appetite change, chills, fatigue, fever and unexpected weight change.  HENT:   Negative for mouth sores, nosebleeds, sore throat and trouble swallowing.   Eyes: Negative for eye problems and icterus.  Respiratory:  Negative for cough, hemoptysis, shortness of breath and wheezing.   Cardiovascular: Negative for chest pain and leg swelling.  Gastrointestinal: Negative for abdominal pain, constipation, diarrhea, nausea and vomiting.  Genitourinary: Negative for bladder incontinence, difficulty urinating, dysuria, frequency and hematuria.   Musculoskeletal: Negative for back pain, gait problem, neck pain and neck stiffness.  Skin: Negative for itching and rash.  Neurological: Negative for dizziness, extremity weakness, gait problem, headaches, light-headedness and seizures.  Hematological: Negative for adenopathy. Does not bruise/bleed easily.  Psychiatric/Behavioral: Negative for confusion, depression and sleep disturbance. The patient is not nervous/anxious.     PHYSICAL EXAMINATION:  There were no vitals taken for this visit.  ECOG PERFORMANCE STATUS: {CHL ONC ECOG [id number]  Physical Exam  Constitutional: Oriented to person, place, and time and well-developed, well-nourished, and in no distress. No distress.  HENT:  Head: Normocephalic and atraumatic.  Mouth/Throat: Oropharynx is clear and moist. No oropharyngeal exudate.  Eyes: Conjunctivae are normal. Right eye  exhibits no discharge. Left eye exhibits no discharge. No scleral icterus.  Neck: Normal range of motion. Neck supple.  Cardiovascular: Normal rate, regular rhythm, normal heart sounds and intact distal pulses.   Pulmonary/Chest: Effort normal and breath sounds normal. No respiratory distress. No wheezes. No rales.  Abdominal: Soft. Bowel sounds are normal. Exhibits no distension and no mass. There is no tenderness.  Musculoskeletal: Normal range of motion. Exhibits no edema.  Lymphadenopathy:    No cervical adenopathy.  Neurological: Alert and oriented to person, place, and time. Exhibits normal muscle tone. Gait normal. Coordination normal.  Skin: Skin is warm and dry. No rash noted. Not diaphoretic. No erythema. No pallor.   Psychiatric: Mood, memory and judgment normal.  Vitals reviewed.  LABORATORY DATA: Lab Results  Component Value Date   WBC 7.4 08/05/2020   HGB 13.1 08/05/2020   HCT 39.1 08/05/2020   MCV 91.6 08/05/2020   PLT 301 08/05/2020      Chemistry      Component Value Date/Time   NA 140 08/05/2020 1125   NA 139 11/24/2017 0913   K 3.7 08/05/2020 1125   K 3.8 11/24/2017 0913   CL 104 08/05/2020 1125   CO2 27 08/05/2020 1125   CO2 24 11/24/2017 0913   BUN 14 08/05/2020 1125   BUN 22.1 11/24/2017 0913   CREATININE 1.13 (H) 08/05/2020 1125   CREATININE 1.2 (H) 11/24/2017 0913      Component Value Date/Time   CALCIUM 10.0 08/05/2020 1125   CALCIUM 9.7 11/24/2017 0913   ALKPHOS 114 08/05/2020 1125   ALKPHOS 99 11/24/2017 0913   AST 13 (L) 08/05/2020 1125   AST 15 11/24/2017 0913   ALT 6 08/05/2020 1125   ALT 7 11/24/2017 0913   BILITOT 0.3 08/05/2020 1125   BILITOT 0.37 11/24/2017 0913       RADIOGRAPHIC STUDIES:  CT Chest W Contrast  Result Date: 08/05/2020 CLINICAL DATA:  Primary Cancer Type: Extensive stage small cell lung cancer. Imaging Indication: Routine surveillance Interval therapy since last imaging? Yes Initial Cancer Diagnosis Date: 10/19/2016; Established by: Biopsy-proven Detailed Pathology: Recurrent small cell lung cancer initially diagnosed as Limited stage small cell lung cancer. Primary Tumor location: Initial: right hilar mass with mediastinal invasion and a right lower lobe pulmonary nodule. Recurrence: right lower lobe and mediastinal lymph nodes. Recurrence? Yes; Date(s) of recurrence: 02/20/2019; Established by: Biopsy-proven Surgeries: No thoracic.  Appendectomy. Chemotherapy: Yes; Ongoing?  No; Most recent administration: 05/2019 Immunotherapy?  Yes; Type: Tecentriq; Ongoing? Yes Radiation therapy? Yes Date Range: 04/11/2017 - 04/21/2017; Target: Prophylactic brain irradiation Date Range: 12/13/2016 - 01/31/2017; Target: Right lung EXAM: CT CHEST, ABDOMEN,  AND PELVIS WITH CONTRAST TECHNIQUE: Multidetector CT imaging of the chest, abdomen and pelvis was performed following the standard protocol during bolus administration of intravenous contrast. CONTRAST:  120mL OMNIPAQUE IOHEXOL 300 MG/ML  SOLN COMPARISON:  Most recent CT chest, abdomen and pelvis 04/30/2020. 01/28/2019 PET-CT. FINDINGS: CT CHEST FINDINGS Cardiovascular: Right IJ power port terminates in the SVC. Atherosclerotic calcification of the aorta. Heart size normal. No pericardial effusion. Mediastinum/Nodes: AP window lymph node has increased in size, now measuring 9 mm, previously 4 mm. Subcarinal lymph node is stable, 1.3 cm. No left hilar or axillary adenopathy. Esophagus is unremarkable. Lungs/Pleura: Post radiation volume loss in the medial aspect of the right hemithorax. Necrotic appearing mass in the right perihilar/subcarinal region has enlarged, measuring 2.1 x 2.9 cm (3/30), compared to 1.1 x 1.3 cm on 04/30/2020. Associated obstruction of the right  lower lobe bronchus with fluid-filled right lower lobe bronchi and complete collapse of the right lower lobe. Small right pleural effusion is new. 3 mm medial posterior segment right upper lobe nodule (4/68), possibly new. Mild centrilobular and paraseptal emphysema. Irregularity of right upper and right middle lobe bronchi. Airway is otherwise unremarkable. Musculoskeletal: No worrisome lytic or sclerotic lesions. CT ABDOMEN PELVIS FINDINGS Hepatobiliary: Liver and gallbladder are unremarkable. No biliary ductal dilatation. Pancreas: Negative. Spleen: Negative. Adrenals/Urinary Tract: Heterogeneous right adrenal mass is stable in size, 3.0 x 3.4 cm. Left adrenal gland and right kidney are unremarkable. Subcentimeter low-attenuation lesion in the interpolar left kidney is too small to characterize but statistically, a cyst is likely. Ureters are decompressed. Bladder is unremarkable. Stomach/Bowel: Small hiatal hernia. Stomach, small bowel and colon  are unremarkable. Appendix is not readily visualized. Vascular/Lymphatic: Atherosclerotic calcification of the aorta without aneurysm. No pathologically enlarged lymph nodes. Reproductive: Uterus is visualized.  No adnexal mass. Other: No free fluid.  Mesenteries and peritoneum are unremarkable. Musculoskeletal: No worrisome lytic or sclerotic lesions. IMPRESSION: 1. Enlarging right perihilar mass with obstruction of the right lower lobe bronchus and complete collapse of the right lower lobe. Associated small right pleural effusion. 2. 3 mm posteromedial right upper lobe nodule, possibly new. Attention on follow-up is recommended. 3. Stable right adrenal previously characterized as an adenoma. 4.  Aortic atherosclerosis (ICD10-I70.0). 5.  Emphysema (ICD10-J43.9). Electronically Signed   By: Lorin Picket M.D.   On: 08/05/2020 10:01   CT Abdomen Pelvis W Contrast  Result Date: 08/05/2020 CLINICAL DATA:  Primary Cancer Type: Extensive stage small cell lung cancer. Imaging Indication: Routine surveillance Interval therapy since last imaging? Yes Initial Cancer Diagnosis Date: 10/19/2016; Established by: Biopsy-proven Detailed Pathology: Recurrent small cell lung cancer initially diagnosed as Limited stage small cell lung cancer. Primary Tumor location: Initial: right hilar mass with mediastinal invasion and a right lower lobe pulmonary nodule. Recurrence: right lower lobe and mediastinal lymph nodes. Recurrence? Yes; Date(s) of recurrence: 02/20/2019; Established by: Biopsy-proven Surgeries: No thoracic.  Appendectomy. Chemotherapy: Yes; Ongoing?  No; Most recent administration: 05/2019 Immunotherapy?  Yes; Type: Tecentriq; Ongoing? Yes Radiation therapy? Yes Date Range: 04/11/2017 - 04/21/2017; Target: Prophylactic brain irradiation Date Range: 12/13/2016 - 01/31/2017; Target: Right lung EXAM: CT CHEST, ABDOMEN, AND PELVIS WITH CONTRAST TECHNIQUE: Multidetector CT imaging of the chest, abdomen and pelvis was  performed following the standard protocol during bolus administration of intravenous contrast. CONTRAST:  178mL OMNIPAQUE IOHEXOL 300 MG/ML  SOLN COMPARISON:  Most recent CT chest, abdomen and pelvis 04/30/2020. 01/28/2019 PET-CT. FINDINGS: CT CHEST FINDINGS Cardiovascular: Right IJ power port terminates in the SVC. Atherosclerotic calcification of the aorta. Heart size normal. No pericardial effusion. Mediastinum/Nodes: AP window lymph node has increased in size, now measuring 9 mm, previously 4 mm. Subcarinal lymph node is stable, 1.3 cm. No left hilar or axillary adenopathy. Esophagus is unremarkable. Lungs/Pleura: Post radiation volume loss in the medial aspect of the right hemithorax. Necrotic appearing mass in the right perihilar/subcarinal region has enlarged, measuring 2.1 x 2.9 cm (3/30), compared to 1.1 x 1.3 cm on 04/30/2020. Associated obstruction of the right lower lobe bronchus with fluid-filled right lower lobe bronchi and complete collapse of the right lower lobe. Small right pleural effusion is new. 3 mm medial posterior segment right upper lobe nodule (4/68), possibly new. Mild centrilobular and paraseptal emphysema. Irregularity of right upper and right middle lobe bronchi. Airway is otherwise unremarkable. Musculoskeletal: No worrisome lytic or sclerotic lesions. CT ABDOMEN PELVIS FINDINGS  Hepatobiliary: Liver and gallbladder are unremarkable. No biliary ductal dilatation. Pancreas: Negative. Spleen: Negative. Adrenals/Urinary Tract: Heterogeneous right adrenal mass is stable in size, 3.0 x 3.4 cm. Left adrenal gland and right kidney are unremarkable. Subcentimeter low-attenuation lesion in the interpolar left kidney is too small to characterize but statistically, a cyst is likely. Ureters are decompressed. Bladder is unremarkable. Stomach/Bowel: Small hiatal hernia. Stomach, small bowel and colon are unremarkable. Appendix is not readily visualized. Vascular/Lymphatic: Atherosclerotic  calcification of the aorta without aneurysm. No pathologically enlarged lymph nodes. Reproductive: Uterus is visualized.  No adnexal mass. Other: No free fluid.  Mesenteries and peritoneum are unremarkable. Musculoskeletal: No worrisome lytic or sclerotic lesions. IMPRESSION: 1. Enlarging right perihilar mass with obstruction of the right lower lobe bronchus and complete collapse of the right lower lobe. Associated small right pleural effusion. 2. 3 mm posteromedial right upper lobe nodule, possibly new. Attention on follow-up is recommended. 3. Stable right adrenal previously characterized as an adenoma. 4.  Aortic atherosclerosis (ICD10-I70.0). 5.  Emphysema (ICD10-J43.9). Electronically Signed   By: Lorin Picket M.D.   On: 08/05/2020 10:01     ASSESSMENT/PLAN:  This is a very pleasant 69 year old Caucasian female with recurrent small cell lung cancer.She was initially diagnosed as limited stage (T2b, N2, M0)in 2017. She presented with alarge right hilar mass with mediastinal invasion and a right lower lobe pulmonary nodule.She is status post a course of systemic chemotherapy with cisplatin and etoposide for 6 cycles with concurrent radiation. She tolerated treatment well except for fatigue.  She also received prophylactic cranial irradiation. She had been on observation since March 2018.  Recent imaging studies from February 2020 showed evidence of disease recurrence within right lower lobe pulmonary nodules in addition to mediastinal lymphadenopathy. A repeat bronchoscopy with endobronchial ultrasound and biopsy confirmed recurrence of small cell lung cancer.  She is currently undergoing systemic chemotherapy with carboplatin, etoposide, and Tecentriq.She is status post17cycles.Starting from cycle #5 the patient has beenreceiving single agent maintenance Tecentriq. She tolerated treatment well except for itching and fatigue.  The patient recently had a restaging CT scan which  showed enlargement of the right perihilar mass with obstruction of the right lower lobe bronchus.  She also has a posterior medial right upper lobe nodule.  She is currently undergoing radiotherapy to this area under the care of Dr. Tammi Klippel.  Her last radiation treatment is expected on 09/04/2020.  The patient was seen with Dr. Earlie Server today.  Labs were reviewed.  Recommend that she proceed with cycle #18 today as scheduled with Tecentriq.  We will see her back for follow-up visit in 3 weeks for evaluation before starting cycle #19.  The patient was advised to call immediately if she has any concerning symptoms in the interval. The patient voices understanding of current disease status and treatment options and is in agreement with the current care plan. All questions were answered. The patient knows to call the clinic with any problems, questions or concerns. We can certainly see the patient much sooner if necessary      No orders of the defined types were placed in this encounter.    Tammy Bowers L Tammy Summerhill, PA-C 08/21/20

## 2020-08-23 IMAGING — CR DG CHEST 2V
2 series · 2 of 2 positions shown · non-contrast
Comparison: PET-CT 01/28/2019

CLINICAL DATA: Preop for EMB.  History of lung cancer.

EXAM:
CHEST - 2 VIEW

[w chest pa]
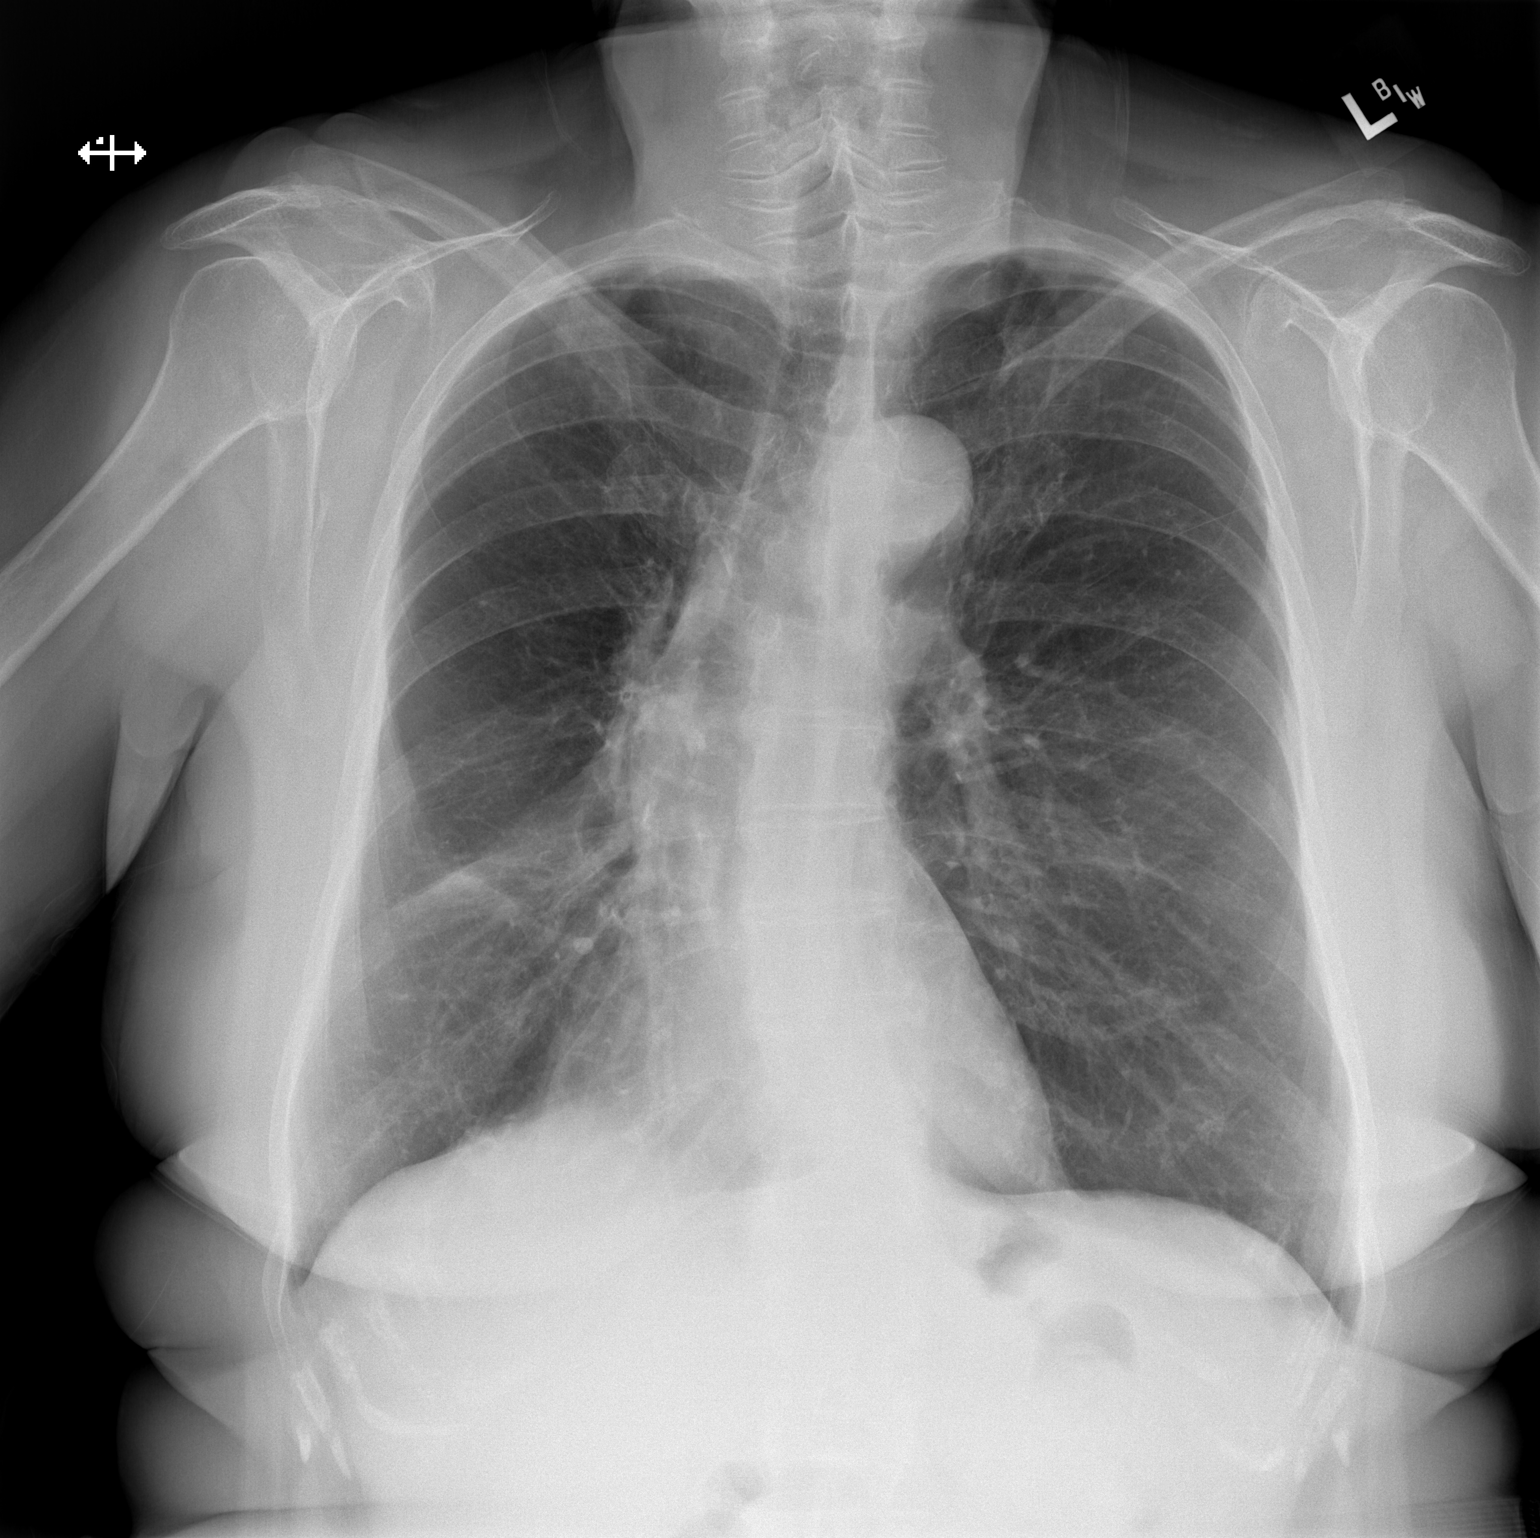

[w chest lat]
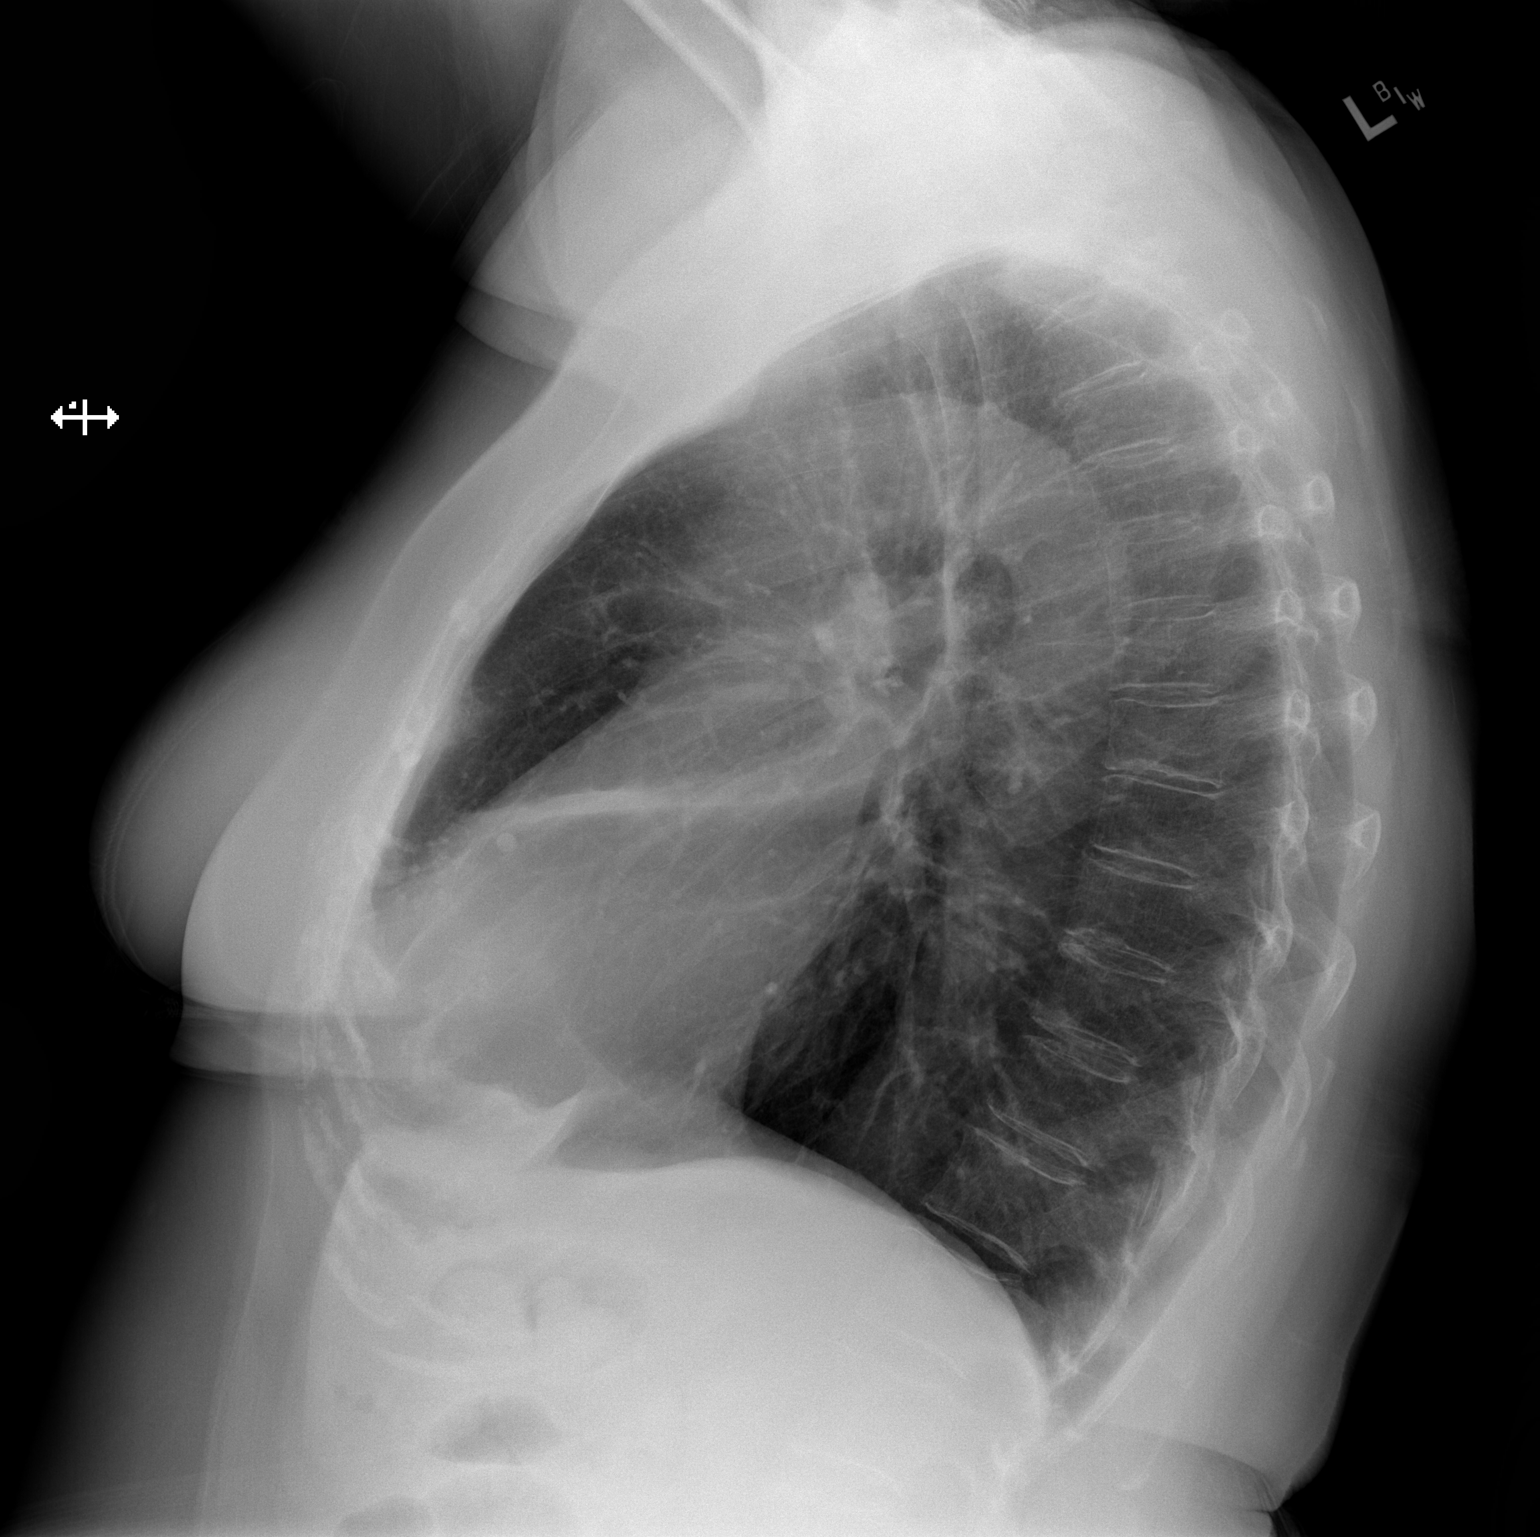

[2 of 2 positions shown; findings below may reference images not displayed]

FINDINGS: The cardiac silhouette, mediastinal and hilar contours are within
normal limits and stable. Mild tortuosity and calcification of the
thoracic aorta.

Stable right hilar soft tissue density and right middle lobe
atelectasis. No infiltrates or effusions. No pneumothorax. The bony
thorax is intact.
IMPRESSION: Stable right hilar prominence and band of right middle lobe
atelectasis.

## 2020-08-25 ENCOUNTER — Ambulatory Visit
Admission: RE | Admit: 2020-08-25 | Discharge: 2020-08-25 | Disposition: A | Discharge status: Home / Self Care | Payer: Medicare Other | Source: Ambulatory Visit | Attending: Radiation Oncology | Admitting: Radiation Oncology

## 2020-08-25 DIAGNOSIS — Z510 Encounter for antineoplastic radiation therapy: Secondary | ICD-10-CM | POA: Diagnosis not present

## 2020-08-25 DIAGNOSIS — C3411 Malignant neoplasm of upper lobe, right bronchus or lung: Secondary | ICD-10-CM | POA: Diagnosis not present

## 2020-08-25 DIAGNOSIS — C3401 Malignant neoplasm of right main bronchus: Secondary | ICD-10-CM | POA: Diagnosis not present

## 2020-08-26 ENCOUNTER — Ambulatory Visit
Admission: RE | Admit: 2020-08-26 | Discharge: 2020-08-26 | Disposition: A | Discharge status: Home / Self Care | Payer: Medicare Other | Source: Ambulatory Visit | Attending: Radiation Oncology | Admitting: Radiation Oncology

## 2020-08-26 ENCOUNTER — Other Ambulatory Visit: Payer: Self-pay

## 2020-08-26 ENCOUNTER — Other Ambulatory Visit: Payer: Self-pay | Admitting: Urology

## 2020-08-26 DIAGNOSIS — C3411 Malignant neoplasm of upper lobe, right bronchus or lung: Secondary | ICD-10-CM | POA: Diagnosis not present

## 2020-08-26 DIAGNOSIS — Z510 Encounter for antineoplastic radiation therapy: Secondary | ICD-10-CM | POA: Diagnosis not present

## 2020-08-26 DIAGNOSIS — C3401 Malignant neoplasm of right main bronchus: Secondary | ICD-10-CM | POA: Diagnosis not present

## 2020-08-26 MED ORDER — SUCRALFATE 1 G PO TABS: 1.0000 g | ORAL_TABLET | Freq: Three times a day (TID) | ORAL | 0 refills | Status: DC

## 2020-08-26 MED ORDER — HYDROCODONE-ACETAMINOPHEN 7.5-325 MG/15ML PO SOLN: 10.0000 mL | Freq: Four times a day (QID) | ORAL | 0 refills | Status: DC | PRN

## 2020-08-27 ENCOUNTER — Encounter: Payer: Self-pay | Admitting: Internal Medicine

## 2020-08-27 ENCOUNTER — Inpatient Hospital Stay: Payer: Medicare Other

## 2020-08-27 ENCOUNTER — Ambulatory Visit
Admission: RE | Admit: 2020-08-27 | Discharge: 2020-08-27 | Disposition: A | Discharge status: Home / Self Care | Payer: Medicare Other | Source: Ambulatory Visit | Attending: Radiation Oncology | Admitting: Radiation Oncology

## 2020-08-27 ENCOUNTER — Other Ambulatory Visit: Payer: Self-pay

## 2020-08-27 ENCOUNTER — Inpatient Hospital Stay: Payer: Medicare Other | Attending: Internal Medicine | Admitting: Internal Medicine

## 2020-08-27 VITALS — HR 99

## 2020-08-27 VITALS — BP 121/76 | HR 108 | Temp 97.4°F | Resp 18 | Wt 209.5 lb

## 2020-08-27 DIAGNOSIS — R911 Solitary pulmonary nodule: Secondary | ICD-10-CM | POA: Diagnosis not present

## 2020-08-27 DIAGNOSIS — Z7951 Long term (current) use of inhaled steroids: Secondary | ICD-10-CM | POA: Insufficient documentation

## 2020-08-27 DIAGNOSIS — J449 Chronic obstructive pulmonary disease, unspecified: Secondary | ICD-10-CM | POA: Diagnosis not present

## 2020-08-27 DIAGNOSIS — C3491 Malignant neoplasm of unspecified part of right bronchus or lung: Secondary | ICD-10-CM | POA: Diagnosis not present

## 2020-08-27 DIAGNOSIS — Z923 Personal history of irradiation: Secondary | ICD-10-CM | POA: Insufficient documentation

## 2020-08-27 DIAGNOSIS — M1990 Unspecified osteoarthritis, unspecified site: Secondary | ICD-10-CM | POA: Diagnosis not present

## 2020-08-27 DIAGNOSIS — Z5112 Encounter for antineoplastic immunotherapy: Secondary | ICD-10-CM | POA: Diagnosis not present

## 2020-08-27 DIAGNOSIS — E039 Hypothyroidism, unspecified: Secondary | ICD-10-CM | POA: Insufficient documentation

## 2020-08-27 DIAGNOSIS — Z95828 Presence of other vascular implants and grafts: Secondary | ICD-10-CM

## 2020-08-27 DIAGNOSIS — C3401 Malignant neoplasm of right main bronchus: Secondary | ICD-10-CM | POA: Diagnosis not present

## 2020-08-27 DIAGNOSIS — C3411 Malignant neoplasm of upper lobe, right bronchus or lung: Secondary | ICD-10-CM | POA: Diagnosis not present

## 2020-08-27 DIAGNOSIS — Z791 Long term (current) use of non-steroidal anti-inflammatories (NSAID): Secondary | ICD-10-CM | POA: Diagnosis not present

## 2020-08-27 DIAGNOSIS — R5383 Other fatigue: Secondary | ICD-10-CM | POA: Diagnosis not present

## 2020-08-27 DIAGNOSIS — Z510 Encounter for antineoplastic radiation therapy: Secondary | ICD-10-CM | POA: Diagnosis not present

## 2020-08-27 DIAGNOSIS — Z9221 Personal history of antineoplastic chemotherapy: Secondary | ICD-10-CM | POA: Diagnosis not present

## 2020-08-27 DIAGNOSIS — C781 Secondary malignant neoplasm of mediastinum: Secondary | ICD-10-CM | POA: Insufficient documentation

## 2020-08-27 DIAGNOSIS — Z79899 Other long term (current) drug therapy: Secondary | ICD-10-CM | POA: Diagnosis not present

## 2020-08-27 LAB — CMP (CANCER CENTER ONLY)
ALT: 8 U/L (ref 0–44)
AST: 15 U/L (ref 15–41)
Albumin: 3.3 g/dL — ABNORMAL LOW (ref 3.5–5.0)
Alkaline Phosphatase: 103 U/L (ref 38–126)
Anion gap: 9 (ref 5–15)
BUN: 19 mg/dL (ref 8–23)
CO2: 24 mmol/L (ref 22–32)
Calcium: 9.2 mg/dL (ref 8.9–10.3)
Chloride: 108 mmol/L (ref 98–111)
Creatinine: 1.07 mg/dL — ABNORMAL HIGH (ref 0.44–1.00)
GFR, Est AFR Am: >60 mL/min (ref >60)
GFR, Estimated: 54 mL/min — ABNORMAL LOW (ref >60)
Glucose, Bld: 102 mg/dL — ABNORMAL HIGH (ref 70–99)
Potassium: 4 mmol/L (ref 3.5–5.1)
Sodium: 141 mmol/L (ref 135–145)
Total Bilirubin: 0.3 mg/dL (ref 0.3–1.2)
Total Protein: 6.9 g/dL (ref 6.5–8.1)

## 2020-08-27 LAB — CBC WITH DIFFERENTIAL (CANCER CENTER ONLY)
Abs Immature Granulocytes: 0.02 10*3/uL (ref 0.00–0.07)
Basophils Absolute: 0.1 10*3/uL (ref 0.0–0.1)
Basophils Relative: 1 %
Eosinophils Absolute: 0.2 10*3/uL (ref 0.0–0.5)
Eosinophils Relative: 3 %
HCT: 39.9 % (ref 36.0–46.0)
Hemoglobin: 13.2 g/dL (ref 12.0–15.0)
Immature Granulocytes: 0 %
Lymphocytes Relative: 10 %
Lymphs Abs: 0.6 10*3/uL — ABNORMAL LOW (ref 0.7–4.0)
MCH: 30.5 pg (ref 26.0–34.0)
MCHC: 33.1 g/dL (ref 30.0–36.0)
MCV: 92.1 fL (ref 80.0–100.0)
Monocytes Absolute: 0.5 10*3/uL (ref 0.1–1.0)
Monocytes Relative: 7 %
Neutro Abs: 5.2 10*3/uL (ref 1.7–7.7)
Neutrophils Relative %: 79 %
Platelet Count: 286 10*3/uL (ref 150–400)
RBC: 4.33 MIL/uL (ref 3.87–5.11)
RDW: 12.8 % (ref 11.5–15.5)
WBC Count: 6.6 10*3/uL (ref 4.0–10.5)
nRBC: 0 % (ref 0.0–0.2)

## 2020-08-27 LAB — TSH: TSH: 3.262 u[IU]/mL (ref 0.308–3.960)

## 2020-08-27 MED ORDER — SODIUM CHLORIDE 0.9% FLUSH
10.0000 mL | Freq: Once | INTRAVENOUS | Status: AC
  Administered 2020-08-27: 10 mL
  Filled 2020-08-27: qty 10

## 2020-08-27 MED ORDER — SODIUM CHLORIDE 0.9% FLUSH
10.0000 mL | INTRAVENOUS | Status: DC | PRN
  Administered 2020-08-27: 10 mL
  Filled 2020-08-27: qty 10

## 2020-08-27 MED ORDER — SODIUM CHLORIDE 0.9 % IV SOLN
Freq: Once | INTRAVENOUS | Status: AC
  Filled 2020-08-27: qty 250

## 2020-08-27 MED ORDER — HEPARIN SOD (PORK) LOCK FLUSH 100 UNIT/ML IV SOLN
500.0000 [IU] | Freq: Once | INTRAVENOUS | Status: AC
  Administered 2020-08-27: 500 [IU]
  Filled 2020-08-27: qty 5

## 2020-08-27 MED ORDER — SODIUM CHLORIDE 0.9 % IV SOLN
1200.0000 mg | Freq: Once | INTRAVENOUS | Status: AC
  Administered 2020-08-27: 1200 mg via INTRAVENOUS
  Filled 2020-08-27: qty 20

## 2020-08-27 MED ORDER — HEPARIN SOD (PORK) LOCK FLUSH 100 UNIT/ML IV SOLN
500.0000 [IU] | Freq: Once | INTRAVENOUS | Status: AC | PRN
  Administered 2020-08-27: 500 [IU]
  Filled 2020-08-27: qty 5

## 2020-08-27 NOTE — Progress Notes (Signed)
[place] [phone]   Fax:(336) [place], MD [address] [place] [address]  DIAGNOSIS: Recurrent small cell lung cancer initially diagnosed as Limited stage (T2b, N2, M0) small cell lung cancer diagnosed in November 2017 and presented with large right hilar mass with mediastinal invasion and a right lower lobe pulmonary nodule.  The patient had disease recurrence in the right lower lobe and mediastinal lymph nodes in February 2020.  PRIOR THERAPY:   1) Systemic chemotherapy with cisplatin 60 MG/M2 on day 1 and etoposide 120 MG/M2 on days 1, 2 and 3 concurrent with radiation status post 6 cycles.  Last dose was giving March 06, 2017. 2) status post prophylactic cranial irradiation. 3) palliative radiotherapy to the left hilar obstructive soft tissue mass under the care of Dr. Tammi Klippel.  CURRENT THERAPY: Systemic chemotherapy with carboplatin for AUC of 5 on day 1, etoposide.  100 mg/M2 on days 1, 2 and 3 as well as Tecentriq 1200 mg IV with Neulasta support every 3 weeks.  First dose March 05, 2019.  Status post 18 cycles.  Starting from cycle 5, the patient will be treated with single agent Tecentriq 1200 mg IV every 3 weeks.  First dose May 28, 2019.  The patient also received few cycles of immunotherapy in [place].  INTERVAL HISTORY: Tammy Bowers 69 y.o. female returns to the clinic today for follow-up visit.  The patient is feeling fine with no concerning complaints except for the baseline shortness of breath increased with exertion as well as fatigue.  She is currently undergoing palliative radiotherapy to the obstructive soft tissue mass in the left hilar area.  She received 8 fractions of radiation so far.  She has some odynophagia and she was giving Carafate.  She denied having any chest pain, cough or hemoptysis.  She denied having any fever or chills.  She has no nausea, vomiting, diarrhea or  constipation.  She denied having any headache or visual changes.  The patient is here today for evaluation before starting cycle #19 of her treatment.   MEDICAL HISTORY: Past Medical History:  Diagnosis Date   Adenopathy 10/10/2016   PERICARINAL   Anemia    Arthritis    Asthma    Cancer ([place])    lung cancer Small Cell   COPD (chronic obstructive pulmonary disease) ([facility])    Dyspnea    Headache    migraines   Hilar mass 10/10/2016   RIGHT   History of kidney stones    Insomnia    Lung nodules 10/10/2016   RIGHT LOWER LOBE   Mass of both adrenal glands ([place]) 10/10/2016   Mood swing    Pneumonia    Spinal headache    Substance abuse ([place])    TOBACCO   UNSPECIFIED INFECTION OF BONE ANKLE AND FOOT 10/22/2009   Annotation: left ankle Qualifier: Diagnosis of  By: Patsy Baltimore RN, Denise      ALLERGIES:  is allergic to lidocaine.  MEDICATIONS:  Current Outpatient Medications  Medication Sig Dispense Refill   albuterol (PROVENTIL HFA;VENTOLIN HFA) 108 (90 BASE) MCG/ACT inhaler Inhale 2 puffs into the lungs every 4 (four) hours as needed for wheezing.     Cyclobenzaprine HCl (FLEXERIL PO) Take 5 mg by mouth 3 (three) times daily as needed (muscle spasms).      diphenoxylate-atropine (LOMOTIL) 2.5-0.025 MG tablet Take 1 tablet by mouth 4 (four) times daily as needed for  diarrhea or loose stools. 30 tablet 0   Fluticasone-Salmeterol (ADVAIR) 500-50 MCG/DOSE AEPB Inhale 1 puff into the lungs 2 (two) times daily.     HYDROcodone-acetaminophen (HYCET) 7.5-325 mg/15 ml solution Take 10 mLs by mouth 4 (four) times daily as needed for moderate pain (use before meals and at bedtime prn). 473 mL 0   ibuprofen (ADVIL) 800 MG tablet Take 800 mg by mouth every 8 (eight) hours as needed.     levothyroxine (SYNTHROID) 100 MCG tablet Take 1 tablet (100 mcg total) by mouth daily. 30 tablet 1   LORazepam (ATIVAN) 0.5 MG tablet Take 1 tablet (0.5 mg total) by mouth at bedtime. 20  tablet 0   Multiple Vitamin (MULTIVITAMIN WITH MINERALS) TABS tablet Take 1 tablet by mouth daily.  (Patient not taking: Reported on 07/16/2020)     prochlorperazine (COMPAZINE) 10 MG tablet TAKE 1 TABLET(10 MG) BY MOUTH EVERY 6 HOURS AS NEEDED FOR NAUSEA OR VOMITING 30 tablet 0   sucralfate (CARAFATE) 1 g tablet Take 1 tablet (1 g total) by mouth 4 (four) times daily -  with meals and at bedtime. Dissolve tablet in 4oz of water and drink 60 tablet 0   No current facility-administered medications for this visit.    SURGICAL HISTORY:  Past Surgical History:  Procedure Laterality Date   ANKLE SURGERY     hardware removal, and initial ankle surgery   APPENDECTOMY     IR IMAGING GUIDED PORT INSERTION  04/12/2019   LUNG BIOPSY N/A 10/19/2016   Procedure: LUNG BIOPSY;  Surgeon: Grace Isaac, MD;  Location: [facility];  Service: Thoracic;  Laterality: N/A;   LUNG BIOPSY N/A 02/20/2019   Procedure: LUNG BIOPSY;  Surgeon: Grace Isaac, MD;  Location: [facility];  Service: Thoracic;  Laterality: N/A;   TONSILLECTOMY     VIDEO BRONCHOSCOPY WITH ENDOBRONCHIAL ULTRASOUND N/A 10/19/2016   Procedure: VIDEO BRONCHOSCOPY WITH ENDOBRONCHIAL ULTRASOUND;  Surgeon: Grace Isaac, MD;  Location: [facility];  Service: Thoracic;  Laterality: N/A;   VIDEO BRONCHOSCOPY WITH ENDOBRONCHIAL ULTRASOUND N/A 02/20/2019   Procedure: VIDEO BRONCHOSCOPY WITH ENDOBRONCHIAL ULTRASOUND;  Surgeon: Grace Isaac, MD;  Location: [facility];  Service: Thoracic;  Laterality: N/A;    REVIEW OF SYSTEMS:  A comprehensive review of systems was negative except for: Constitutional: positive for fatigue Respiratory: positive for dyspnea on exertion   PHYSICAL EXAMINATION: General appearance: alert, cooperative, fatigued and no distress Head: Normocephalic, without obvious abnormality, atraumatic Neck: no adenopathy, no JVD, supple, symmetrical, trachea midline and thyroid not enlarged, symmetric, no tenderness/mass/nodules Lymph  nodes: Cervical, supraclavicular, and axillary nodes normal. Resp: clear to auscultation bilaterally Back: symmetric, no curvature. ROM normal. No CVA tenderness. Cardio: regular rate and rhythm, S1, S2 normal, no murmur, click, rub or gallop GI: soft, non-tender; bowel sounds normal; no masses,  no organomegaly Extremities: extremities normal, atraumatic, no cyanosis or edema  ECOG PERFORMANCE STATUS: 1 - Symptomatic but completely ambulatory  Blood pressure 121/76, pulse (!) 108, temperature (!) 97.4 F (36.3 C), temperature source Tympanic, resp. rate 18, weight 209 lb 8 oz (95 kg), SpO2 95 %.  LABORATORY DATA: Lab Results  Component Value Date   WBC 6.6 08/27/2020   HGB 13.2 08/27/2020   HCT 39.9 08/27/2020   MCV 92.1 08/27/2020   PLT 286 08/27/2020      Chemistry      Component Value Date/Time   NA 140 08/05/2020 1125   NA 139 11/24/2017 0913   K 3.7 08/05/2020 1125  K 3.8 11/24/2017 0913   CL 104 08/05/2020 1125   CO2 27 08/05/2020 1125   CO2 24 11/24/2017 0913   BUN 14 08/05/2020 1125   BUN 22.1 11/24/2017 0913   CREATININE 1.13 (H) 08/05/2020 1125   CREATININE 1.2 (H) 11/24/2017 0913      Component Value Date/Time   CALCIUM 10.0 08/05/2020 1125   CALCIUM 9.7 11/24/2017 0913   ALKPHOS 114 08/05/2020 1125   ALKPHOS 99 11/24/2017 0913   AST 13 (L) 08/05/2020 1125   AST 15 11/24/2017 0913   ALT 6 08/05/2020 1125   ALT 7 11/24/2017 0913   BILITOT 0.3 08/05/2020 1125   BILITOT 0.37 11/24/2017 0913       RADIOGRAPHIC STUDIES: CT Chest W Contrast  Result Date: 08/05/2020 CLINICAL DATA:  Primary Cancer Type: Extensive stage small cell lung cancer. Imaging Indication: Routine surveillance Interval therapy since last imaging? Yes Initial Cancer Diagnosis Date: 10/19/2016; Established by: Biopsy-proven Detailed Pathology: Recurrent small cell lung cancer initially diagnosed as Limited stage small cell lung cancer. Primary Tumor location: Initial: right hilar mass  with mediastinal invasion and a right lower lobe pulmonary nodule. Recurrence: right lower lobe and mediastinal lymph nodes. Recurrence? Yes; Date(s) of recurrence: 02/20/2019; Established by: Biopsy-proven Surgeries: No thoracic.  Appendectomy. Chemotherapy: Yes; Ongoing?  No; Most recent administration: 05/2019 Immunotherapy?  Yes; Type: Tecentriq; Ongoing? Yes Radiation therapy? Yes Date Range: 04/11/2017 - 04/21/2017; Target: Prophylactic brain irradiation Date Range: 12/13/2016 - 01/31/2017; Target: Right lung EXAM: CT CHEST, ABDOMEN, AND PELVIS WITH CONTRAST TECHNIQUE: Multidetector CT imaging of the chest, abdomen and pelvis was performed following the standard protocol during bolus administration of intravenous contrast. CONTRAST:  143mL OMNIPAQUE IOHEXOL 300 MG/ML  SOLN COMPARISON:  Most recent CT chest, abdomen and pelvis 04/30/2020. 01/28/2019 PET-CT. FINDINGS: CT CHEST FINDINGS Cardiovascular: Right IJ power port terminates in the SVC. Atherosclerotic calcification of the aorta. Heart size normal. No pericardial effusion. Mediastinum/Nodes: AP window lymph node has increased in size, now measuring 9 mm, previously 4 mm. Subcarinal lymph node is stable, 1.3 cm. No left hilar or axillary adenopathy. Esophagus is unremarkable. Lungs/Pleura: Post radiation volume loss in the medial aspect of the right hemithorax. Necrotic appearing mass in the right perihilar/subcarinal region has enlarged, measuring 2.1 x 2.9 cm (3/30), compared to 1.1 x 1.3 cm on 04/30/2020. Associated obstruction of the right lower lobe bronchus with fluid-filled right lower lobe bronchi and complete collapse of the right lower lobe. Small right pleural effusion is new. 3 mm medial posterior segment right upper lobe nodule (4/68), possibly new. Mild centrilobular and paraseptal emphysema. Irregularity of right upper and right middle lobe bronchi. Airway is otherwise unremarkable. Musculoskeletal: No worrisome lytic or sclerotic lesions.  CT ABDOMEN PELVIS FINDINGS Hepatobiliary: Liver and gallbladder are unremarkable. No biliary ductal dilatation. Pancreas: Negative. Spleen: Negative. Adrenals/Urinary Tract: Heterogeneous right adrenal mass is stable in size, 3.0 x 3.4 cm. Left adrenal gland and right kidney are unremarkable. Subcentimeter low-attenuation lesion in the interpolar left kidney is too small to characterize but statistically, a cyst is likely. Ureters are decompressed. Bladder is unremarkable. Stomach/Bowel: Small hiatal hernia. Stomach, small bowel and colon are unremarkable. Appendix is not readily visualized. Vascular/Lymphatic: Atherosclerotic calcification of the aorta without aneurysm. No pathologically enlarged lymph nodes. Reproductive: Uterus is visualized.  No adnexal mass. Other: No free fluid.  Mesenteries and peritoneum are unremarkable. Musculoskeletal: No worrisome lytic or sclerotic lesions. IMPRESSION: 1. Enlarging right perihilar mass with obstruction of the right lower lobe bronchus and complete  collapse of the right lower lobe. Associated small right pleural effusion. 2. 3 mm posteromedial right upper lobe nodule, possibly new. Attention on follow-up is recommended. 3. Stable right adrenal previously characterized as an adenoma. 4.  Aortic atherosclerosis (ICD10-I70.0). 5.  Emphysema (ICD10-J43.9). Electronically Signed   By: Lorin Picket M.D.   On: 08/05/2020 10:01   CT Abdomen Pelvis W Contrast  Result Date: 08/05/2020 CLINICAL DATA:  Primary Cancer Type: Extensive stage small cell lung cancer. Imaging Indication: Routine surveillance Interval therapy since last imaging? Yes Initial Cancer Diagnosis Date: 10/19/2016; Established by: Biopsy-proven Detailed Pathology: Recurrent small cell lung cancer initially diagnosed as Limited stage small cell lung cancer. Primary Tumor location: Initial: right hilar mass with mediastinal invasion and a right lower lobe pulmonary nodule. Recurrence: right lower lobe and  mediastinal lymph nodes. Recurrence? Yes; Date(s) of recurrence: 02/20/2019; Established by: Biopsy-proven Surgeries: No thoracic.  Appendectomy. Chemotherapy: Yes; Ongoing?  No; Most recent administration: 05/2019 Immunotherapy?  Yes; Type: Tecentriq; Ongoing? Yes Radiation therapy? Yes Date Range: 04/11/2017 - 04/21/2017; Target: Prophylactic brain irradiation Date Range: 12/13/2016 - 01/31/2017; Target: Right lung EXAM: CT CHEST, ABDOMEN, AND PELVIS WITH CONTRAST TECHNIQUE: Multidetector CT imaging of the chest, abdomen and pelvis was performed following the standard protocol during bolus administration of intravenous contrast. CONTRAST:  152mL OMNIPAQUE IOHEXOL 300 MG/ML  SOLN COMPARISON:  Most recent CT chest, abdomen and pelvis 04/30/2020. 01/28/2019 PET-CT. FINDINGS: CT CHEST FINDINGS Cardiovascular: Right IJ power port terminates in the SVC. Atherosclerotic calcification of the aorta. Heart size normal. No pericardial effusion. Mediastinum/Nodes: AP window lymph node has increased in size, now measuring 9 mm, previously 4 mm. Subcarinal lymph node is stable, 1.3 cm. No left hilar or axillary adenopathy. Esophagus is unremarkable. Lungs/Pleura: Post radiation volume loss in the medial aspect of the right hemithorax. Necrotic appearing mass in the right perihilar/subcarinal region has enlarged, measuring 2.1 x 2.9 cm (3/30), compared to 1.1 x 1.3 cm on 04/30/2020. Associated obstruction of the right lower lobe bronchus with fluid-filled right lower lobe bronchi and complete collapse of the right lower lobe. Small right pleural effusion is new. 3 mm medial posterior segment right upper lobe nodule (4/68), possibly new. Mild centrilobular and paraseptal emphysema. Irregularity of right upper and right middle lobe bronchi. Airway is otherwise unremarkable. Musculoskeletal: No worrisome lytic or sclerotic lesions. CT ABDOMEN PELVIS FINDINGS Hepatobiliary: Liver and gallbladder are unremarkable. No biliary ductal  dilatation. Pancreas: Negative. Spleen: Negative. Adrenals/Urinary Tract: Heterogeneous right adrenal mass is stable in size, 3.0 x 3.4 cm. Left adrenal gland and right kidney are unremarkable. Subcentimeter low-attenuation lesion in the interpolar left kidney is too small to characterize but statistically, a cyst is likely. Ureters are decompressed. Bladder is unremarkable. Stomach/Bowel: Small hiatal hernia. Stomach, small bowel and colon are unremarkable. Appendix is not readily visualized. Vascular/Lymphatic: Atherosclerotic calcification of the aorta without aneurysm. No pathologically enlarged lymph nodes. Reproductive: Uterus is visualized.  No adnexal mass. Other: No free fluid.  Mesenteries and peritoneum are unremarkable. Musculoskeletal: No worrisome lytic or sclerotic lesions. IMPRESSION: 1. Enlarging right perihilar mass with obstruction of the right lower lobe bronchus and complete collapse of the right lower lobe. Associated small right pleural effusion. 2. 3 mm posteromedial right upper lobe nodule, possibly new. Attention on follow-up is recommended. 3. Stable right adrenal previously characterized as an adenoma. 4.  Aortic atherosclerosis (ICD10-I70.0). 5.  Emphysema (ICD10-J43.9). Electronically Signed   By: Lorin Picket M.D.   On: 08/05/2020 10:01    ASSESSMENT AND PLAN:  This is a very pleasant 69 years old white female with recurrent small cell lung cancer that was initially diagnosed as limited stage disease status post a course of systemic chemotherapy with cisplatin and etoposide for 6 cycles concurrent with radiation. She tolerated her treatment well except for fatigue. She is also status post prophylactic cranial irradiation. The patient has been in observation since March 2018. She was found on recent imaging studies to have evidence for disease recurrence with right lower lobe pulmonary nodules in addition to mediastinal lymphadenopathy. Repeat bronchoscopy with endobronchial  ultrasound and biopsy confirmed recurrence of his small cell lung cancer. The patient was started on systemic chemotherapy with carboplatin, etoposide and Tecentriq status post 18 cycles.  She has partial response after cycle #4 and starting from cycle #5 the patient is currently on maintenance treatment with Tecentriq.  The patient has been tolerating this treatment well with no concerning adverse effects. I recommended for her to proceed with cycle #19 today as planned. She is also currently undergoing palliative radiotherapy to the left perihilar soft tissue obstructive mass. I will see her back for follow-up visit in 3 weeks for evaluation before the next cycle of her treatment. For the hypothyroidism, she will continue her current treatment with levothyroxine and will monitor her TSH closely and adjust her dose as needed. The patient was advised to call immediately if she has any concerning symptoms in the interval. The patient voices understanding of current disease status and treatment options and is in agreement with the current care plan.  All questions were answered. The patient knows to call the clinic with any problems, questions or concerns. We can certainly see the patient much sooner if necessary.  Disclaimer: This note was dictated with voice recognition software. Similar sounding words can inadvertently be transcribed and may not be corrected upon review.

## 2020-08-27 NOTE — Patient Instructions (Signed)
[facility] Discharge Instructions for Patients Receiving Chemotherapy  Today you received the following Immunotherapy agent: Atezolizumab(Tecentriq)  To help prevent nausea and vomiting after your treatment, we encourage you to take your nausea medication as directed by your MD.   If you develop nausea and vomiting that is not controlled by your nausea medication, call the clinic.   BELOW ARE SYMPTOMS THAT SHOULD BE REPORTED IMMEDIATELY:  *FEVER GREATER THAN 100.5 F  *CHILLS WITH OR WITHOUT FEVER  NAUSEA AND VOMITING THAT IS NOT CONTROLLED WITH YOUR NAUSEA MEDICATION  *UNUSUAL SHORTNESS OF BREATH  *UNUSUAL BRUISING OR BLEEDING  TENDERNESS IN MOUTH AND THROAT WITH OR WITHOUT PRESENCE OF ULCERS  *URINARY PROBLEMS  *BOWEL PROBLEMS  UNUSUAL RASH Items with * indicate a potential emergency and should be followed up as soon as possible.  Feel free to call the clinic should you have any questions or concerns. The clinic phone number is [phone].  Please show the [facility] at check-in to the Emergency Department and triage nurse.

## 2020-08-28 ENCOUNTER — Telehealth: Payer: Self-pay | Admitting: Radiation Oncology

## 2020-08-28 ENCOUNTER — Encounter: Payer: Self-pay | Admitting: Radiation Oncology

## 2020-08-28 ENCOUNTER — Ambulatory Visit
Admission: RE | Admit: 2020-08-28 | Discharge: 2020-08-28 | Disposition: A | Discharge status: Home / Self Care | Payer: Medicare Other | Source: Ambulatory Visit | Attending: Radiation Oncology | Admitting: Radiation Oncology

## 2020-08-28 DIAGNOSIS — C3401 Malignant neoplasm of right main bronchus: Secondary | ICD-10-CM | POA: Diagnosis not present

## 2020-08-28 DIAGNOSIS — C3411 Malignant neoplasm of upper lobe, right bronchus or lung: Secondary | ICD-10-CM | POA: Diagnosis not present

## 2020-08-28 DIAGNOSIS — Z510 Encounter for antineoplastic radiation therapy: Secondary | ICD-10-CM | POA: Diagnosis not present

## 2020-08-28 NOTE — Telephone Encounter (Signed)
-----   Message from Freeman Caldron, [place] sent at 08/26/2020  4:53 PM EDT ----- Regarding: RE: Throat Pain. Difficulty swallowing. Please let patient know that I have sent 2 Rxs to her pharmacy- Carafate and Hycet. These should be ready for pickup this evening. -Ashlyn ----- Message ----- From: Heywood Footman, RN Sent: 08/26/2020   4:03 PM EDT To: [name], MD, Freeman Caldron, PA-C Subject: Throat Pain. Difficulty swallowing.            To date patient has received 7 of 14 treatments to her right lung (re-treat). Following treatment today she presented to nursing reporting new onset painful swallowing and sore throat. Her preferred pharmacy is [facility], [employer].    69 yo woman with small cell carcinoma of the right lower lung, locally recurrent, following previous definitive chemoradiotherapy  Sam

## 2020-08-28 NOTE — Telephone Encounter (Signed)
Phoned patient to make her aware two scripts - Hycet and Carafate - have been sent to her pharmacy. Patient confirms picking up the Carafate. This RN explained that the Hycet was kicked back by her insurance. This RN went onto explain that I have started the necessary paperwork (prior authorization) to try and get the Hycet approved. This RN reviewed directions for use of the Carafate. The patient verbalized understanding and expressed appreciation for the call.

## 2020-08-28 NOTE — Progress Notes (Signed)
Submitted prior authorization for hydrocodone/acetaminophen elixir via [employer]. Awaiting reply.   CASE ID [id number]. [facility] Submitted 08-28-20 at [phone]

## 2020-08-31 ENCOUNTER — Ambulatory Visit
Admission: RE | Admit: 2020-08-31 | Discharge: 2020-08-31 | Disposition: A | Discharge status: Home / Self Care | Payer: Medicare Other | Source: Ambulatory Visit | Attending: Radiation Oncology | Admitting: Radiation Oncology

## 2020-08-31 ENCOUNTER — Other Ambulatory Visit: Payer: Self-pay

## 2020-08-31 ENCOUNTER — Encounter: Payer: Self-pay | Admitting: Radiation Oncology

## 2020-08-31 DIAGNOSIS — C3411 Malignant neoplasm of upper lobe, right bronchus or lung: Secondary | ICD-10-CM | POA: Diagnosis not present

## 2020-08-31 DIAGNOSIS — C3401 Malignant neoplasm of right main bronchus: Secondary | ICD-10-CM | POA: Diagnosis not present

## 2020-08-31 DIAGNOSIS — Z510 Encounter for antineoplastic radiation therapy: Secondary | ICD-10-CM | POA: Diagnosis not present

## 2020-09-01 ENCOUNTER — Ambulatory Visit: Payer: Medicare Other

## 2020-09-01 ENCOUNTER — Telehealth: Payer: Self-pay | Admitting: *Deleted

## 2020-09-01 NOTE — Telephone Encounter (Signed)
Patient called to cancel the rest of her radiation treatments. She said that she is going on vacation and wants to feel good. She did not want to reschedule when asked.

## 2020-09-02 ENCOUNTER — Ambulatory Visit: Payer: Medicare Other

## 2020-09-03 ENCOUNTER — Ambulatory Visit: Payer: Medicare Other

## 2020-09-04 ENCOUNTER — Ambulatory Visit: Payer: Medicare Other

## 2020-09-08 ENCOUNTER — Other Ambulatory Visit: Payer: Self-pay | Admitting: Medical Oncology

## 2020-09-08 DIAGNOSIS — R5382 Chronic fatigue, unspecified: Secondary | ICD-10-CM

## 2020-09-08 MED ORDER — LEVOTHYROXINE SODIUM 100 MCG PO TABS: 100.0000 ug | ORAL_TABLET | Freq: Every day | ORAL | 2 refills | Status: DC

## 2020-09-16 ENCOUNTER — Inpatient Hospital Stay: Payer: Medicare Other

## 2020-09-16 ENCOUNTER — Telehealth: Payer: Self-pay | Admitting: Medical Oncology

## 2020-09-16 ENCOUNTER — Inpatient Hospital Stay: Payer: Medicare Other | Admitting: Internal Medicine

## 2020-09-16 NOTE — Telephone Encounter (Signed)
LVM to return my call about missed appt today.

## 2020-10-05 ENCOUNTER — Telehealth: Payer: Self-pay | Admitting: Medical Oncology

## 2020-10-05 NOTE — Telephone Encounter (Signed)
First available next week with Lab and Chemo.

## 2020-10-05 NOTE — Telephone Encounter (Signed)
When does she need to come in?   She just got back from travelling to [facility] and [place] and is home now. She states she does not have any COVID symptoms.  Last scan 08/18.

## 2020-10-06 ENCOUNTER — Telehealth: Payer: Self-pay | Admitting: Physician Assistant

## 2020-10-06 NOTE — Telephone Encounter (Signed)
Scheduled appt per 10/19 schmsg - left message for patient with appt date and time

## 2020-10-07 ENCOUNTER — Telehealth: Payer: Self-pay

## 2020-10-07 NOTE — Telephone Encounter (Addendum)
I spoke with the pt and advised of her appt date 10/13/20 with an arrival time for her labs at 11:15am. I also made pt aware of the time gap between her lab/OV and infusion time. Pt was okay with this as she prefers to have it all the same day.  ----- Message from [place], PA-C sent at 10/07/2020  1:39 PM EDT ----- Regarding: FW: Add on 10/26 Sorry, here is another one. Can you try calling her? ----- Message ----- From: Nicholaus Corolla Sent: 10/06/2020   4:57 PM EDT To: Tobe Sos Heilingoetter, PA-C Subject: FW: Add on 10/26                               Scheduled pt for next wk- there is a big gap in between f/u and infusion . I called her but got a vmail. If pt called back in please let her know if that doesn;t work we will have to decouple appts.  ----- Message ----- From: Scot Dock, RN Sent: 10/06/2020   4:21 PM EDT To: Nicholaus Corolla, [facility] Rn Pool Subject: RE: Add on 10/26                               We have availability in infusion at 2:30 on 10/26 for a 2 hour appt. If that works. ----- Message ----- From: Nicholaus Corolla Sent: 10/06/2020   4:02 PM EDT To: Nicholaus Corolla, [facility] Rn Pool Subject: Add on 10/26                                   Scheduling Message Entered by Gracelyn Nurse on 10/06/2020 at 3:27 PM Priority: High EST PT 30 Department: [facility] Provider: Heilingoetter, Tobe Sos, PA-C Appointment Notes: Labs, flush, return visit, and infusion first available early next week. I have plenty of openings on Tuesday

## 2020-10-13 ENCOUNTER — Inpatient Hospital Stay: Payer: Medicare Other

## 2020-10-13 ENCOUNTER — Other Ambulatory Visit: Payer: Self-pay

## 2020-10-13 ENCOUNTER — Inpatient Hospital Stay: Payer: Medicare Other | Attending: Internal Medicine

## 2020-10-13 ENCOUNTER — Inpatient Hospital Stay (HOSPITAL_BASED_OUTPATIENT_CLINIC_OR_DEPARTMENT_OTHER): Payer: Medicare Other | Admitting: Internal Medicine

## 2020-10-13 ENCOUNTER — Encounter: Payer: Self-pay | Admitting: Internal Medicine

## 2020-10-13 VITALS — HR 92

## 2020-10-13 VITALS — BP 144/86 | HR 102 | Temp 98.1°F | Resp 18 | Ht 66.0 in | Wt 213.9 lb

## 2020-10-13 DIAGNOSIS — C771 Secondary and unspecified malignant neoplasm of intrathoracic lymph nodes: Secondary | ICD-10-CM | POA: Insufficient documentation

## 2020-10-13 DIAGNOSIS — Z5112 Encounter for antineoplastic immunotherapy: Secondary | ICD-10-CM

## 2020-10-13 DIAGNOSIS — Z923 Personal history of irradiation: Secondary | ICD-10-CM | POA: Insufficient documentation

## 2020-10-13 DIAGNOSIS — Z791 Long term (current) use of non-steroidal anti-inflammatories (NSAID): Secondary | ICD-10-CM | POA: Diagnosis not present

## 2020-10-13 DIAGNOSIS — J449 Chronic obstructive pulmonary disease, unspecified: Secondary | ICD-10-CM | POA: Diagnosis not present

## 2020-10-13 DIAGNOSIS — C3491 Malignant neoplasm of unspecified part of right bronchus or lung: Secondary | ICD-10-CM

## 2020-10-13 DIAGNOSIS — E039 Hypothyroidism, unspecified: Secondary | ICD-10-CM | POA: Diagnosis not present

## 2020-10-13 DIAGNOSIS — Z95828 Presence of other vascular implants and grafts: Secondary | ICD-10-CM

## 2020-10-13 DIAGNOSIS — C3490 Malignant neoplasm of unspecified part of unspecified bronchus or lung: Secondary | ICD-10-CM

## 2020-10-13 DIAGNOSIS — Z7951 Long term (current) use of inhaled steroids: Secondary | ICD-10-CM | POA: Diagnosis not present

## 2020-10-13 DIAGNOSIS — Z79899 Other long term (current) drug therapy: Secondary | ICD-10-CM | POA: Insufficient documentation

## 2020-10-13 DIAGNOSIS — R5383 Other fatigue: Secondary | ICD-10-CM

## 2020-10-13 DIAGNOSIS — Z9221 Personal history of antineoplastic chemotherapy: Secondary | ICD-10-CM | POA: Insufficient documentation

## 2020-10-13 LAB — TSH: TSH: 5.339 u[IU]/mL — ABNORMAL HIGH (ref 0.308–3.960)

## 2020-10-13 LAB — CBC WITH DIFFERENTIAL (CANCER CENTER ONLY)
Abs Immature Granulocytes: 0.02 10*3/uL (ref 0.00–0.07)
Basophils Absolute: 0.1 10*3/uL (ref 0.0–0.1)
Basophils Relative: 1 %
Eosinophils Absolute: 0.1 10*3/uL (ref 0.0–0.5)
Eosinophils Relative: 2 %
HCT: 39.2 % (ref 36.0–46.0)
Hemoglobin: 13 g/dL (ref 12.0–15.0)
Immature Granulocytes: 0 %
Lymphocytes Relative: 9 %
Lymphs Abs: 0.6 10*3/uL — ABNORMAL LOW (ref 0.7–4.0)
MCH: 30.6 pg (ref 26.0–34.0)
MCHC: 33.2 g/dL (ref 30.0–36.0)
MCV: 92.2 fL (ref 80.0–100.0)
Monocytes Absolute: 0.5 10*3/uL (ref 0.1–1.0)
Monocytes Relative: 8 %
Neutro Abs: 5.5 10*3/uL (ref 1.7–7.7)
Neutrophils Relative %: 80 %
Platelet Count: 292 10*3/uL (ref 150–400)
RBC: 4.25 MIL/uL (ref 3.87–5.11)
RDW: 13.7 % (ref 11.5–15.5)
WBC Count: 6.8 10*3/uL (ref 4.0–10.5)
nRBC: 0 % (ref 0.0–0.2)

## 2020-10-13 LAB — CMP (CANCER CENTER ONLY)
ALT: 11 U/L (ref 0–44)
AST: 14 U/L — ABNORMAL LOW (ref 15–41)
Albumin: 3.4 g/dL — ABNORMAL LOW (ref 3.5–5.0)
Alkaline Phosphatase: 111 U/L (ref 38–126)
Anion gap: 8 (ref 5–15)
BUN: 20 mg/dL (ref 8–23)
CO2: 27 mmol/L (ref 22–32)
Calcium: 9.2 mg/dL (ref 8.9–10.3)
Chloride: 105 mmol/L (ref 98–111)
Creatinine: 1.05 mg/dL — ABNORMAL HIGH (ref 0.44–1.00)
GFR, Estimated: 58 mL/min — ABNORMAL LOW (ref >60)
Glucose, Bld: 80 mg/dL (ref 70–99)
Potassium: 3.6 mmol/L (ref 3.5–5.1)
Sodium: 140 mmol/L (ref 135–145)
Total Bilirubin: 0.4 mg/dL (ref 0.3–1.2)
Total Protein: 6.7 g/dL (ref 6.5–8.1)

## 2020-10-13 MED ORDER — SODIUM CHLORIDE 0.9 % IV SOLN
Freq: Once | INTRAVENOUS | Status: AC
  Filled 2020-10-13: qty 250

## 2020-10-13 MED ORDER — SODIUM CHLORIDE 0.9 % IV SOLN
1200.0000 mg | Freq: Once | INTRAVENOUS | Status: AC
  Administered 2020-10-13: 1200 mg via INTRAVENOUS
  Filled 2020-10-13: qty 20

## 2020-10-13 MED ORDER — HEPARIN SOD (PORK) LOCK FLUSH 100 UNIT/ML IV SOLN
500.0000 [IU] | Freq: Once | INTRAVENOUS | Status: AC | PRN
  Administered 2020-10-13: 500 [IU]
  Filled 2020-10-13: qty 5

## 2020-10-13 MED ORDER — SODIUM CHLORIDE 0.9% FLUSH
10.0000 mL | INTRAVENOUS | Status: DC | PRN
  Administered 2020-10-13: 10 mL
  Filled 2020-10-13: qty 10

## 2020-10-13 MED ORDER — SODIUM CHLORIDE 0.9% FLUSH
10.0000 mL | Freq: Once | INTRAVENOUS | Status: AC
  Administered 2020-10-13: 10 mL
  Filled 2020-10-13: qty 10

## 2020-10-13 NOTE — Progress Notes (Signed)
[facility] [phone]) [phone]   Fax:(336) [place], MD [place] [address] [place] [address]  DIAGNOSIS: Recurrent small cell lung cancer initially diagnosed as Limited stage (T2b, N2, M0) small cell lung cancer diagnosed in November 2017 and presented with large right hilar mass with mediastinal invasion and a right lower lobe pulmonary nodule.  The patient had disease recurrence in the right lower lobe and mediastinal lymph nodes in February 2020.  PRIOR THERAPY:   1) Systemic chemotherapy with cisplatin 60 MG/M2 on day 1 and etoposide 120 MG/M2 on days 1, 2 and 3 concurrent with radiation status post 6 cycles.  Last dose was giving March 06, 2017. 2) status post prophylactic cranial irradiation. 3) palliative radiotherapy to the left hilar obstructive soft tissue mass under the care of Dr. Tammi Klippel.  CURRENT THERAPY: Systemic chemotherapy with carboplatin for AUC of 5 on day 1, etoposide.  100 mg/M2 on days 1, 2 and 3 as well as Tecentriq 1200 mg IV with Neulasta support every 3 weeks.  First dose March 05, 2019.  Status post 20 cycles.  Starting from cycle 5, the patient will be treated with single agent Tecentriq 1200 mg IV every 3 weeks.  First dose May 28, 2019.  The patient also received few cycles of immunotherapy in [place].  INTERVAL HISTORY: Tammy Bowers 69 y.o. female returns to the clinic today for follow-up visit.  The patient is feeling fine today with no concerning complaints.  She missed the last cycle of her treatment because she was on vacation with her family to [place] the last [place] in [place].  The patient denied having any current chest pain, shortness of breath except with exertion with no cough or hemoptysis.  She denied having any nausea, vomiting, diarrhea or constipation.  She denied having any headache or visual changes.  She has no recent weight loss or night sweats.  She is here  today for evaluation before resuming her treatment with immunotherapy.   MEDICAL HISTORY: Past Medical History:  Diagnosis Date  . Adenopathy 10/10/2016   PERICARINAL  . Anemia   . Arthritis   . Asthma   . Cancer ([place])    lung cancer Small Cell  . COPD (chronic obstructive pulmonary disease) ([place])   . Dyspnea   . Headache    migraines  . Hilar mass 10/10/2016   RIGHT  . History of kidney stones   . Insomnia   . Lung nodules 10/10/2016   RIGHT LOWER LOBE  . Mass of both adrenal glands ([place]) 10/10/2016  . Mood swing   . Pneumonia   . Spinal headache   . Substance abuse ([place])    TOBACCO  . UNSPECIFIED INFECTION OF BONE ANKLE AND FOOT 10/22/2009   Annotation: left ankle Qualifier: Diagnosis of  By: Patsy Baltimore RN, Denise      ALLERGIES:  is allergic to lidocaine.  MEDICATIONS:  Current Outpatient Medications  Medication Sig Dispense Refill  . albuterol (PROVENTIL HFA;VENTOLIN HFA) 108 (90 BASE) MCG/ACT inhaler Inhale 2 puffs into the lungs every 4 (four) hours as needed for wheezing.    . Cyclobenzaprine HCl (FLEXERIL PO) Take 5 mg by mouth 3 (three) times daily as needed (muscle spasms).     . diphenoxylate-atropine (LOMOTIL) 2.5-0.025 MG tablet Take 1 tablet by mouth 4 (four) times daily as needed for diarrhea or loose stools. 30 tablet 0  . Fluticasone-Salmeterol (ADVAIR) 500-50 MCG/DOSE  AEPB Inhale 1 puff into the lungs 2 (two) times daily.    Marland Kitchen HYDROcodone-acetaminophen (HYCET) 7.5-325 mg/15 ml solution Take 10 mLs by mouth 4 (four) times daily as needed for moderate pain (use before meals and at bedtime prn). 473 mL 0  . ibuprofen (ADVIL) 800 MG tablet Take 800 mg by mouth every 8 (eight) hours as needed.    Marland Kitchen levothyroxine (SYNTHROID) 100 MCG tablet Take 1 tablet (100 mcg total) by mouth daily. 30 tablet 2  . Multiple Vitamin (MULTIVITAMIN WITH MINERALS) TABS tablet Take 1 tablet by mouth daily.     . prochlorperazine (COMPAZINE) 10 MG tablet TAKE 1 TABLET(10 MG) BY MOUTH  EVERY 6 HOURS AS NEEDED FOR NAUSEA OR VOMITING 30 tablet 0  . sucralfate (CARAFATE) 1 g tablet Take 1 tablet (1 g total) by mouth 4 (four) times daily -  with meals and at bedtime. Dissolve tablet in 4oz of water and drink 60 tablet 0  . OVER THE COUNTER MEDICATION 1 capsule. [place] tail-"mushroom in a capsule" (Patient not taking: Reported on 10/13/2020)     No current facility-administered medications for this visit.    SURGICAL HISTORY:  Past Surgical History:  Procedure Laterality Date  . ANKLE SURGERY     hardware removal, and initial ankle surgery  . APPENDECTOMY    . IR IMAGING GUIDED PORT INSERTION  04/12/2019  . LUNG BIOPSY N/A 10/19/2016   Procedure: LUNG BIOPSY;  Surgeon: Grace Isaac, MD;  Location: [facility];  Service: Thoracic;  Laterality: N/A;  . LUNG BIOPSY N/A 02/20/2019   Procedure: LUNG BIOPSY;  Surgeon: Grace Isaac, MD;  Location: [facility];  Service: Thoracic;  Laterality: N/A;  . TONSILLECTOMY    . VIDEO BRONCHOSCOPY WITH ENDOBRONCHIAL ULTRASOUND N/A 10/19/2016   Procedure: VIDEO BRONCHOSCOPY WITH ENDOBRONCHIAL ULTRASOUND;  Surgeon: Grace Isaac, MD;  Location: [facility];  Service: Thoracic;  Laterality: N/A;  . VIDEO BRONCHOSCOPY WITH ENDOBRONCHIAL ULTRASOUND N/A 02/20/2019   Procedure: VIDEO BRONCHOSCOPY WITH ENDOBRONCHIAL ULTRASOUND;  Surgeon: Grace Isaac, MD;  Location: [facility];  Service: Thoracic;  Laterality: N/A;    REVIEW OF SYSTEMS:  A comprehensive review of systems was negative except for: Constitutional: positive for fatigue Respiratory: positive for dyspnea on exertion   PHYSICAL EXAMINATION: General appearance: alert, cooperative, fatigued and no distress Head: Normocephalic, without obvious abnormality, atraumatic Neck: no adenopathy, no JVD, supple, symmetrical, trachea midline and thyroid not enlarged, symmetric, no tenderness/mass/nodules Lymph nodes: Cervical, supraclavicular, and axillary nodes normal. Resp: clear to auscultation  bilaterally Back: symmetric, no curvature. ROM normal. No CVA tenderness. Cardio: regular rate and rhythm, S1, S2 normal, no murmur, click, rub or gallop GI: soft, non-tender; bowel sounds normal; no masses,  no organomegaly Extremities: extremities normal, atraumatic, no cyanosis or edema  ECOG PERFORMANCE STATUS: 1 - Symptomatic but completely ambulatory  Blood pressure (!) 144/86, pulse (!) 102, temperature 98.1 F (36.7 C), temperature source Tympanic, resp. rate 18, height 5\' 6"  (1.676 m), weight 213 lb 14.4 oz (97 kg), SpO2 98 %.  LABORATORY DATA: Lab Results  Component Value Date   WBC 6.6 08/27/2020   HGB 13.2 08/27/2020   HCT 39.9 08/27/2020   MCV 92.1 08/27/2020   PLT 286 08/27/2020      Chemistry      Component Value Date/Time   NA 141 08/27/2020 1441   NA 139 11/24/2017 0913   K 4.0 08/27/2020 1441   K 3.8 11/24/2017 0913   CL 108 08/27/2020 1441  CO2 24 08/27/2020 1441   CO2 24 11/24/2017 0913   BUN 19 08/27/2020 1441   BUN 22.1 11/24/2017 0913   CREATININE 1.07 (H) 08/27/2020 1441   CREATININE 1.2 (H) 11/24/2017 0913      Component Value Date/Time   CALCIUM 9.2 08/27/2020 1441   CALCIUM 9.7 11/24/2017 0913   ALKPHOS 103 08/27/2020 1441   ALKPHOS 99 11/24/2017 0913   AST 15 08/27/2020 1441   AST 15 11/24/2017 0913   ALT 8 08/27/2020 1441   ALT 7 11/24/2017 0913   BILITOT 0.3 08/27/2020 1441   BILITOT 0.37 11/24/2017 0913       RADIOGRAPHIC STUDIES: No results found.  ASSESSMENT AND PLAN:  This is a very pleasant 69 years old white female with recurrent small cell lung cancer that was initially diagnosed as limited stage disease status post a course of systemic chemotherapy with cisplatin and etoposide for 6 cycles concurrent with radiation. She tolerated her treatment well except for fatigue. She is also status post prophylactic cranial irradiation. The patient has been in observation since March 2018. She was found on recent imaging studies to  have evidence for disease recurrence with right lower lobe pulmonary nodules in addition to mediastinal lymphadenopathy. Repeat bronchoscopy with endobronchial ultrasound and biopsy confirmed recurrence of his small cell lung cancer. The patient was started on systemic chemotherapy with carboplatin, etoposide and Tecentriq status post 20 cycles.  She has partial response after cycle #4 and starting from cycle #5 the patient is currently on maintenance treatment with Tecentriq.  She has been tolerating this treatment well with no concerning adverse effects.  Her last cycle was more than 6 weeks ago and she missed the last scheduled treatment because she was on vacation. She is feeling fine today.  I recommended for her to proceed with her treatment as planned with cycle #21. I will see her back for follow-up visit in 3 weeks for evaluation after repeating CT scan of the chest, abdomen pelvis for restaging of her disease. For the hypothyroidism, she will continue her current treatment with levothyroxine and will monitor her TSH closely and adjust her dose as needed. The patient was advised to call immediately if she has any concerning symptoms in the interval. The patient voices understanding of current disease status and treatment options and is in agreement with the current care plan.  All questions were answered. The patient knows to call the clinic with any problems, questions or concerns. We can certainly see the patient much sooner if necessary.  Disclaimer: This note was dictated with voice recognition software. Similar sounding words can inadvertently be transcribed and may not be corrected upon review.

## 2020-10-13 NOTE — Patient Instructions (Signed)

## 2020-10-13 NOTE — Patient Instructions (Signed)
[facility] Discharge Instructions for Patients Receiving Chemotherapy  Today you received the following chemotherapy agents: Tecentriq  To help prevent nausea and vomiting after your treatment, we encourage you to take your nausea medication as directed.   If you develop nausea and vomiting that is not controlled by your nausea medication, call the clinic.   BELOW ARE SYMPTOMS THAT SHOULD BE REPORTED IMMEDIATELY:  *FEVER GREATER THAN 100.5 F  *CHILLS WITH OR WITHOUT FEVER  NAUSEA AND VOMITING THAT IS NOT CONTROLLED WITH YOUR NAUSEA MEDICATION  *UNUSUAL SHORTNESS OF BREATH  *UNUSUAL BRUISING OR BLEEDING  TENDERNESS IN MOUTH AND THROAT WITH OR WITHOUT PRESENCE OF ULCERS  *URINARY PROBLEMS  *BOWEL PROBLEMS  UNUSUAL RASH Items with * indicate a potential emergency and should be followed up as soon as possible.  Feel free to call the clinic should you have any questions or concerns. The clinic phone number is [phone].  Please show the CHEMO ALERT CARD at check-in to the Emergency Department and triage nurse.   

## 2020-10-18 IMAGING — US IR LEFT FLUORO GUIDE CV LINE
1 series · 3 of 3 positions shown · non-contrast
Comparison: None.

INDICATION: 68-year-old with small cell lung cancer. Port-A-Cath needed for
therapy.

EXAM:
FLUOROSCOPIC AND ULTRASOUND GUIDED PLACEMENT OF A SUBCUTANEOUS PORT

[Series 1: ir fluoro/shunt/fist · 3 of 3 slices shown]
[im 1/3]
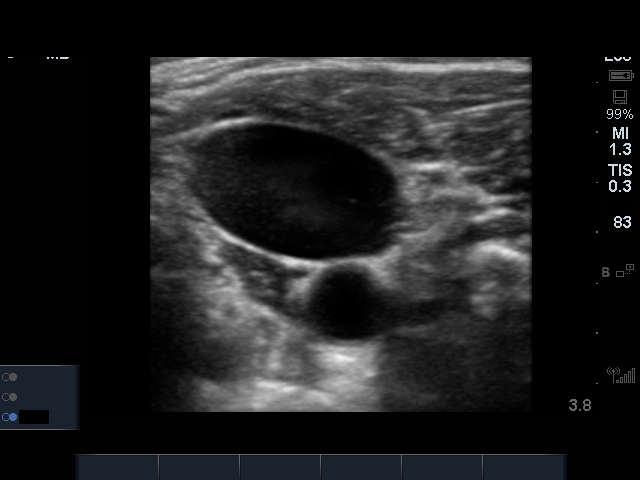
[im 2/3]
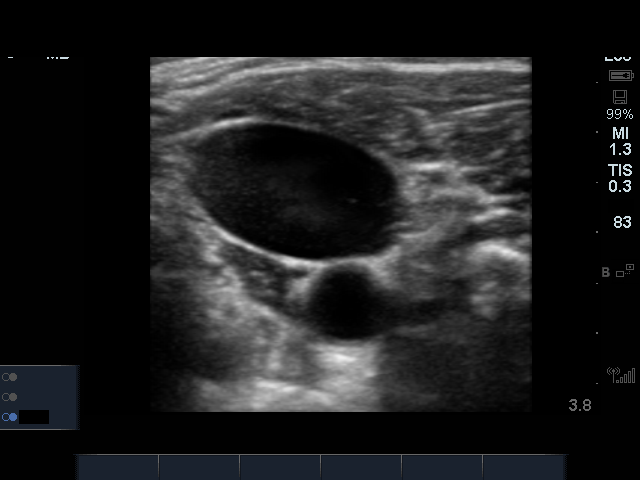
[im 3/3]
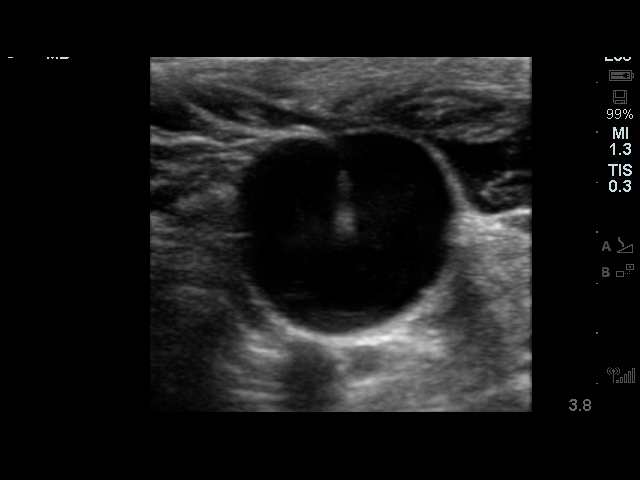

[3 of 3 positions shown; findings below may reference images not displayed]

MEDICATIONS:
Ancef 2 g; The antibiotic was administered within an appropriate
time interval prior to skin puncture.

ANESTHESIA/SEDATION:
Versed 4.0 mg IV; Fentanyl 100 mcg IV;

Moderate Sedation Time:  33 minutes

The patient was continuously monitored during the procedure by the
interventional radiology nurse under my direct supervision.

FLUOROSCOPY TIME:  12 seconds, 1 mGy

COMPLICATIONS:
None immediate.

PROCEDURE:
The procedure, risks, benefits, and alternatives were explained to
the patient. Questions regarding the procedure were encouraged and
answered. The patient understands and consents to the procedure.

Patient was placed supine on the interventional table. Ultrasound
confirmed a patent right internal jugular vein. The right chest and
neck were cleaned with a skin antiseptic and a sterile drape was
placed. Maximal barrier sterile technique was utilized including
caps, mask, sterile gowns, sterile gloves, sterile drape, hand
hygiene and skin antiseptic. The right neck was anesthetized with 1%
lidocaine. Small incision was made in the right neck with a blade.
Micropuncture set was placed in the right internal jugular vein with
ultrasound guidance. The micropuncture wire was used for measurement
purposes. The right chest was anesthetized with 1% lidocaine with
epinephrine. #15 blade was used to make an incision and a
subcutaneous port pocket was formed. 8 french Power Port was
assembled. Subcutaneous tunnel was formed with a stiff tunneling
device. The port catheter was brought through the subcutaneous
tunnel. The port was placed in the subcutaneous pocket and sutured
in place. The micropuncture set was exchanged for a peel-away
sheath. The catheter was placed through the peel-away sheath and the
tip was positioned at the SVC and right atrium junction. Catheter
placement was confirmed with fluoroscopy. The port was accessed and
flushed with heparinized saline. The port pocket was closed using
two layers of absorbable sutures and Dermabond. The vein skin site
was closed using a single layer of absorbable suture and Dermabond.
Sterile dressings were applied. Patient tolerated the procedure well
without an immediate complication. Ultrasound and fluoroscopic
images were taken and saved for this procedure.
IMPRESSION: Placement of a subcutaneous port device.

## 2020-10-20 ENCOUNTER — Telehealth: Payer: Self-pay | Admitting: Internal Medicine

## 2020-10-20 NOTE — Telephone Encounter (Signed)
Called and spoke with patient. Confirmed appts  °

## 2020-11-03 ENCOUNTER — Ambulatory Visit (HOSPITAL_COMMUNITY)
Admission: RE | Admit: 2020-11-03 | Discharge: 2020-11-03 | Disposition: A | Discharge status: Home / Self Care | Payer: Medicare Other | Source: Ambulatory Visit | Attending: Internal Medicine | Admitting: Internal Medicine

## 2020-11-03 ENCOUNTER — Encounter (HOSPITAL_COMMUNITY): Payer: Self-pay

## 2020-11-03 ENCOUNTER — Other Ambulatory Visit: Payer: Self-pay

## 2020-11-03 DIAGNOSIS — C3490 Malignant neoplasm of unspecified part of unspecified bronchus or lung: Secondary | ICD-10-CM | POA: Diagnosis not present

## 2020-11-03 DIAGNOSIS — R059 Cough, unspecified: Secondary | ICD-10-CM | POA: Diagnosis not present

## 2020-11-03 MED ORDER — HEPARIN SOD (PORK) LOCK FLUSH 100 UNIT/ML IV SOLN
INTRAVENOUS | Status: AC
  Administered 2020-11-03: 500 [IU] via INTRAVENOUS
  Filled 2020-11-03: qty 5

## 2020-11-03 MED ORDER — HEPARIN SOD (PORK) LOCK FLUSH 100 UNIT/ML IV SOLN: 500.0000 [IU] | Freq: Once | INTRAVENOUS | Status: DC

## 2020-11-03 MED ORDER — IOHEXOL 300 MG/ML  SOLN
100.0000 mL | Freq: Once | INTRAMUSCULAR | Status: AC | PRN
  Administered 2020-11-03: 100 mL via INTRAVENOUS

## 2020-11-03 NOTE — Progress Notes (Signed)
Tammy Bowers, Tammy Bowers, Tammy Bowers  DIAGNOSIS: Recurrent small cell lung cancer initially diagnosed as Limited stage (T2b, N2, M0) small cell lung cancer diagnosed in November 2017 and presented with large right hilar mass with mediastinal invasion and a right lower lobe pulmonary nodule.  The patient had disease recurrence in the right lower lobe and mediastinal lymph nodes in February 2020.  PRIOR THERAPY:  1) Systemic chemotherapy with cisplatin 60 MG/M2 on day 1 and etoposide 120 MG/M2 on days 1, 2 and 3 concurrent with radiation status post 6 cycles.  Last dose was giving March 06, 2017. 2) status post prophylactic cranial irradiation. 3) palliative radiotherapy to the left hilar obstructive soft tissue mass under the care of Dr. Tammi Klippel.  CURRENT THERAPY: Systemic chemotherapy with carboplatin for AUC of 5 on day 1, etoposide.  100 mg/M2 on days 1, 2 and 3 as well as Tecentriq 1200 mg IV with Neulasta support every 3 weeks.  First dose March 05, 2019.  Status post 20 cycles.  Starting from cycle 5, the patient will be treated with single agent Tecentriq 1200 mg IV every 3 weeks.  First dose May 28, 2019.  The patient also received few cycles of immunotherapy in [place].  INTERVAL HISTORY: Eleen Bowers 69 y.o. female returns to the clinic for a follow up visit accompanied by her husband. The patient is feeling well today without any concerning complaints. The patient continues to tolerate treatment with immunotherapy with tecentriq well without any adverse effects. She denies any fever, chills, night sweats, or weight loss.  She denies any hemoptysis.  She reports her baseline shortness of breath with exertion and cough. She uses Luden's and her inhaler. If her cough gets "really bad" she will use OTC cough medication. She continues to smoke approximately cigarettes. She sometimes experiences a fleeting pain  under her breasts bilaterally which is not associated with eating, positions, exertion, etc. The pain resolves spontaneously. She is not having any pain today. Denies any hemoptysis. Denies any vomiting or diarrhea. She has some mild nausea and constipation for which she uses prunes. Denies any headache or visual changes. Denies any rashes or skin changes. The patient recently had a restaging CT scan performed. The patient is here today for evaluation and to review her scan results prior to starting cycle # 21   MEDICAL HISTORY: Past Medical History:  Diagnosis Date  . Adenopathy 10/10/2016   PERICARINAL  . Anemia   . Arthritis   . Asthma   . Cancer ([place])    lung cancer Small Cell  . COPD (chronic obstructive pulmonary disease) ([place])   . Dyspnea   . Headache    migraines  . Hilar mass 10/10/2016   RIGHT  . History of kidney stones   . Insomnia   . Lung nodules 10/10/2016   RIGHT LOWER LOBE  . Mass of both adrenal glands ([place]) 10/10/2016  . Mood swing   . Pneumonia   . Spinal headache   . Substance abuse ([place])    TOBACCO  . UNSPECIFIED INFECTION OF BONE ANKLE AND FOOT 10/22/2009   Annotation: left ankle Qualifier: Diagnosis of  By: Patsy Baltimore RN, Denise      ALLERGIES:  is allergic to lidocaine.  MEDICATIONS:  Current Outpatient Medications  Medication Sig Dispense Refill  . albuterol (PROVENTIL HFA;VENTOLIN HFA) 108 (90 BASE) MCG/ACT inhaler Inhale 2 puffs into the lungs every 4 (four) hours  as needed for wheezing.    . clonazePAM (KLONOPIN) 0.5 MG tablet     . Cyclobenzaprine HCl (FLEXERIL PO) Take 5 mg by mouth 3 (three) times daily as needed (muscle spasms).     . diphenoxylate-atropine (LOMOTIL) 2.5-0.025 MG tablet Take 1 tablet by mouth 4 (four) times daily as needed for diarrhea or loose stools. 30 tablet 0  . Fluticasone-Salmeterol (ADVAIR) 500-50 MCG/DOSE AEPB Inhale 1 puff into the lungs 2 (two) times daily.    Marland Kitchen HYDROcodone-acetaminophen (HYCET) 7.5-325 mg/15 ml  solution Take 10 mLs by mouth 4 (four) times daily as needed for moderate pain (use before meals and at bedtime prn). 473 mL 0  . ibuprofen (ADVIL) 800 MG tablet Take 800 mg by mouth every 8 (eight) hours as needed.    Marland Kitchen levothyroxine (SYNTHROID) 100 MCG tablet Take 1 tablet (100 mcg total) by mouth daily. 30 tablet 2  . Multiple Vitamin (MULTIVITAMIN WITH MINERALS) TABS tablet Take 1 tablet by mouth daily.     Marland Kitchen OVER THE COUNTER MEDICATION 1 capsule. [place] tail-"mushroom in a capsule"     . prochlorperazine (COMPAZINE) 10 MG tablet TAKE 1 TABLET(10 MG) BY MOUTH EVERY 6 HOURS AS NEEDED FOR NAUSEA OR VOMITING 30 tablet 0  . sucralfate (CARAFATE) 1 g tablet Take 1 tablet (1 g total) by mouth 4 (four) times daily -  with meals and at bedtime. Dissolve tablet in 4oz of water and drink 60 tablet 0   No current facility-administered medications for this visit.    SURGICAL HISTORY:  Past Surgical History:  Procedure Laterality Date  . ANKLE SURGERY     hardware removal, and initial ankle surgery  . APPENDECTOMY    . IR IMAGING GUIDED PORT INSERTION  04/12/2019  . LUNG BIOPSY N/A 10/19/2016   Procedure: LUNG BIOPSY;  Surgeon: Grace Isaac, Tammy;  Location: [facility];  Service: Thoracic;  Laterality: N/A;  . LUNG BIOPSY N/A 02/20/2019   Procedure: LUNG BIOPSY;  Surgeon: Grace Isaac, Tammy;  Location: [facility];  Service: Thoracic;  Laterality: N/A;  . TONSILLECTOMY    . VIDEO BRONCHOSCOPY WITH ENDOBRONCHIAL ULTRASOUND N/A 10/19/2016   Procedure: VIDEO BRONCHOSCOPY WITH ENDOBRONCHIAL ULTRASOUND;  Surgeon: Grace Isaac, Tammy;  Location: [facility];  Service: Thoracic;  Laterality: N/A;  . VIDEO BRONCHOSCOPY WITH ENDOBRONCHIAL ULTRASOUND N/A 02/20/2019   Procedure: VIDEO BRONCHOSCOPY WITH ENDOBRONCHIAL ULTRASOUND;  Surgeon: Grace Isaac, Tammy;  Location: [facility];  Service: Thoracic;  Laterality: N/A;    REVIEW OF SYSTEMS:   Review of Systems  Constitutional: Negative for appetite change, chills, fatigue,  fever and unexpected weight change.  HENT: Negative for mouth sores, nosebleeds, sore throat and trouble swallowing.   Eyes: Negative for eye problems and icterus.  Respiratory: Positive for cough. Negative for hemoptysis, shortness of breath and wheezing.   Cardiovascular: Negative for chest pain and leg swelling.  Gastrointestinal: Negative for abdominal pain, constipation, diarrhea, nausea and vomiting.  Genitourinary: Negative for bladder incontinence, difficulty urinating, dysuria, frequency and hematuria.   Musculoskeletal: Negative for back pain, gait problem, neck pain and neck stiffness.  Skin: Negative for itching and rash.  Neurological: Negative for dizziness, extremity weakness, gait problem, headaches, light-headedness and seizures.  Hematological: Negative for adenopathy. Does not bruise/bleed easily.  Psychiatric/Behavioral: Negative for confusion, depression and sleep disturbance. The patient is not nervous/anxious.     PHYSICAL EXAMINATION:  Blood pressure 111/80, pulse (!) 107, temperature 98.6 F (37 C), temperature source Tympanic, resp. rate 13, height  5\' 6"  (1.676 m), weight 212 lb (96.2 kg), SpO2 97 %.  ECOG PERFORMANCE STATUS: 1 - Symptomatic but completely ambulatory  Physical Exam  Constitutional: Oriented to person, place, and time and well-developed, well-nourished, and in no distress.  HENT:  Head: Normocephalic and atraumatic.  Mouth/Throat: Oropharynx is clear and moist. No oropharyngeal exudate.  Eyes: Conjunctivae are normal. Right eye exhibits no discharge. Left eye exhibits no discharge. No scleral icterus.  Neck: Normal range of motion. Neck supple.  Cardiovascular: Normal rate, regular rhythm, normal heart sounds and intact distal pulses.   Pulmonary/Chest: Effort normal and breath sounds normal with mild expiratory wheezing. No respiratory distress. No rales.  Abdominal: Soft. Bowel sounds are normal. Exhibits no distension and no mass. There is no  tenderness.  Musculoskeletal: Normal range of motion. Exhibits no edema.  Lymphadenopathy:    No cervical adenopathy.  Neurological: Alert and oriented to person, place, and time. Exhibits normal muscle tone. Gait normal. Coordination normal.  Skin: Skin is warm and dry. No rash noted. Not diaphoretic. No erythema. No pallor.  Psychiatric: Mood, memory and judgment normal.  Vitals reviewed.  LABORATORY DATA: Lab Results  Component Value Date   WBC 5.9 11/05/2020   HGB 12.8 11/05/2020   HCT 38.9 11/05/2020   MCV 91.3 11/05/2020   PLT 246 11/05/2020      Chemistry      Component Value Date/Time   NA 140 10/13/2020 1156   NA 139 11/24/2017 0913   K 3.6 10/13/2020 1156   K 3.8 11/24/2017 0913   CL 105 10/13/2020 1156   CO2 27 10/13/2020 1156   CO2 24 11/24/2017 0913   BUN 20 10/13/2020 1156   BUN 22.1 11/24/2017 0913   CREATININE 1.05 (H) 10/13/2020 1156   CREATININE 1.2 (H) 11/24/2017 0913      Component Value Date/Time   CALCIUM 9.2 10/13/2020 1156   CALCIUM 9.7 11/24/2017 0913   ALKPHOS 111 10/13/2020 1156   ALKPHOS 99 11/24/2017 0913   AST 14 (L) 10/13/2020 1156   AST 15 11/24/2017 0913   ALT 11 10/13/2020 1156   ALT 7 11/24/2017 0913   BILITOT 0.4 10/13/2020 1156   BILITOT 0.37 11/24/2017 0913       RADIOGRAPHIC STUDIES:  CT Chest W Contrast  Result Date: 11/03/2020 CLINICAL DATA:  Restaging of small cell lung cancer, immunotherapy in progress, prior chemotherapy and radiation therapy. Chronic shortness of breath and cough with nausea and constipation. EXAM: CT CHEST, ABDOMEN, AND PELVIS WITH CONTRAST TECHNIQUE: Multidetector CT imaging of the chest, abdomen and pelvis was performed following the standard protocol during bolus administration of intravenous contrast. CONTRAST:  131mL OMNIPAQUE IOHEXOL 300 MG/ML  SOLN COMPARISON:  Multiple exams, including 08/04/2020 FINDINGS: CT CHEST FINDINGS Cardiovascular: Right Port-A-Cath tip: SVC. Coronary, aortic arch, and  branch vessel atherosclerotic vascular disease. Mediastinum/Nodes: Calcified lower paratracheal, subcarinal, and right hilar lymph nodes are again observed. Lungs/Pleura: Prominently reduced size of the right lower lobe nodule, currently about 1.3 by 1.3 cm on image 26 of series 2, and previously about 3.1 by 1.8 cm by my measurements. The bronchus intermedius is narrowed but no longer occluded. There is some residual right middle lobe atelectasis but the right middle lobe is no longer completely collapsed, and there is been re-aeration of the right lower lobe. Right perihilar consolidation is likely from radiation therapy in the region. Airway thickening is present, suggesting bronchitis or reactive airways disease. Musculoskeletal: Unremarkable CT ABDOMEN PELVIS FINDINGS Hepatobiliary: Stable 3 mm hypodense  lesion inferiorly in the right hepatic lobe on image 65 of series 2, highly likely to be incidental. Gallbladder unremarkable. Pancreas: Unremarkable Spleen: Is punctate calcification compatible with old granulomatous disease. Adrenals/Urinary Tract: 3.5 by 3.0 cm right adrenal mass appears stable. Small partially exophytic hypodense lesion from the left kidney upper pole measuring 0.7 cm in diameter on image 59 of series 2 is technically too small to characterize. Stomach/Bowel: Unremarkable Vascular/Lymphatic: Aortoiliac atherosclerotic vascular disease. Reproductive: Unremarkable Other: No supplemental non-categorized findings. Musculoskeletal: Unremarkable IMPRESSION: 1. Prominently reduced size of the right lower lobe nodule, with some minimal residual right middle lobe atelectasis, but otherwise re-expansion of the right middle lobe and right lower lobe. Right perihilar consolidation is likely from radiation therapy in the region. 2. Stable right adrenal mass, previously characterized as an adenoma. 3. Airway thickening is present, suggesting bronchitis or reactive airways disease. 4. Coronary, aortic  arch, and branch vessel atherosclerotic vascular disease. 5. Old granulomatous disease. 6. Small partially exophytic hypodense lesion from the left kidney upper pole is technically too small to characterize. 7. Stable 3 mm hypodense lesion inferiorly in the right hepatic lobe, highly likely to be incidental. 8. Aortic atherosclerosis. Aortic Atherosclerosis (ICD10-I70.0). Electronically Signed   By: Van Clines M.D.   On: 11/03/2020 14:55   CT Abdomen Pelvis W Contrast  Result Date: 11/03/2020 CLINICAL DATA:  Restaging of small cell lung cancer, immunotherapy in progress, prior chemotherapy and radiation therapy. Chronic shortness of breath and cough with nausea and constipation. EXAM: CT CHEST, ABDOMEN, AND PELVIS WITH CONTRAST TECHNIQUE: Multidetector CT imaging of the chest, abdomen and pelvis was performed following the standard protocol during bolus administration of intravenous contrast. CONTRAST:  180mL OMNIPAQUE IOHEXOL 300 MG/ML  SOLN COMPARISON:  Multiple exams, including 08/04/2020 FINDINGS: CT CHEST FINDINGS Cardiovascular: Right Port-A-Cath tip: SVC. Coronary, aortic arch, and branch vessel atherosclerotic vascular disease. Mediastinum/Nodes: Calcified lower paratracheal, subcarinal, and right hilar lymph nodes are again observed. Lungs/Pleura: Prominently reduced size of the right lower lobe nodule, currently about 1.3 by 1.3 cm on image 26 of series 2, and previously about 3.1 by 1.8 cm by my measurements. The bronchus intermedius is narrowed but no longer occluded. There is some residual right middle lobe atelectasis but the right middle lobe is no longer completely collapsed, and there is been re-aeration of the right lower lobe. Right perihilar consolidation is likely from radiation therapy in the region. Airway thickening is present, suggesting bronchitis or reactive airways disease. Musculoskeletal: Unremarkable CT ABDOMEN PELVIS FINDINGS Hepatobiliary: Stable 3 mm hypodense lesion  inferiorly in the right hepatic lobe on image 65 of series 2, highly likely to be incidental. Gallbladder unremarkable. Pancreas: Unremarkable Spleen: Is punctate calcification compatible with old granulomatous disease. Adrenals/Urinary Tract: 3.5 by 3.0 cm right adrenal mass appears stable. Small partially exophytic hypodense lesion from the left kidney upper pole measuring 0.7 cm in diameter on image 59 of series 2 is technically too small to characterize. Stomach/Bowel: Unremarkable Vascular/Lymphatic: Aortoiliac atherosclerotic vascular disease. Reproductive: Unremarkable Other: No supplemental non-categorized findings. Musculoskeletal: Unremarkable IMPRESSION: 1. Prominently reduced size of the right lower lobe nodule, with some minimal residual right middle lobe atelectasis, but otherwise re-expansion of the right middle lobe and right lower lobe. Right perihilar consolidation is likely from radiation therapy in the region. 2. Stable right adrenal mass, previously characterized as an adenoma. 3. Airway thickening is present, suggesting bronchitis or reactive airways disease. 4. Coronary, aortic arch, and branch vessel atherosclerotic vascular disease. 5. Old granulomatous disease. 6. Small  partially exophytic hypodense lesion from the left kidney upper pole is technically too small to characterize. 7. Stable 3 mm hypodense lesion inferiorly in the right hepatic lobe, highly likely to be incidental. 8. Aortic atherosclerosis. Aortic Atherosclerosis (ICD10-I70.0). Electronically Signed   By: Van Clines M.D.   On: 11/03/2020 14:55     ASSESSMENT/PLAN:  This is a very pleasant 69 year old Caucasian female with recurrent small cell lung cancer.She was initially diagnosed as limited stage (T2b, N2, M0)in 2017. She presented with alarge right hilar mass with mediastinal invasion and a right lower lobe pulmonary nodule.She is status post a course of systemic chemotherapy with cisplatin and  etoposide for 6 cycles with concurrent radiation. She tolerated treatment well except for fatigue.  She also received prophylactic cranial irradiation. She had been on observation since March 2018.  Recent imaging studies from February 2020 showed evidence of disease recurrence within right lower lobe pulmonary nodules in addition to mediastinal lymphadenopathy. A repeat bronchoscopy with endobronchial ultrasound and biopsy confirmed recurrence of small cell lung cancer.  She is currently undergoing systemic chemotherapy with carboplatin, etoposide, and Tecentriq.She is status post20cycles.Starting from cycle #5 the patient has beenreceiving single agent maintenance Tecentriq. She has received some of her treatment in [place].   The patient recently had A restaging CT scan performed.  Dr. Julien Nordmann personally and independently reviewed the scan and discussed the results with the patient today.  The scan showed no evidence for disease progression. There was actually some decrease in size of the right lower lobe lung nodule.  Dr. Julien Nordmann recommends that she proceed with cycle #21 today as scheduled.   We will see her back for a follow up visit in 3 weeks for evaluation before starting cycle #22.   The patient was advised to call immediately if she has any concerning symptoms in the interval. The patient voices understanding of current disease status and treatment options and is in agreement with the current care plan. All questions were answered. The patient knows to call the clinic with any problems, questions or concerns. We can certainly see the patient much sooner if necessary   No orders of the defined types were placed in this encounter.    Kiren Mcisaac L Paislee Szatkowski, PA-C 11/05/20  ADDENDUM: Hematology/Oncology Attending: I had a face-to-face encounter with the patient today.  I recommended her care plan.  This is a very pleasant 69 years old white female with recurrent small cell  lung cancer status post induction treatment with systemic chemotherapy with carboplatin, etoposide and Tecentriq.  She has partial response to the induction phase and she is currently on treatment with maintenance Tecentriq status post total of 20 cycles.  The patient has been tolerating her treatment well with no concerning adverse effects. She had repeat CT scan of the chest, abdomen pelvis performed recently. I personally and independently reviewed the scans and discussed the results with the patient and her husband. Her scan showed no concerning findings for disease progression and there was some decrease in the size of right lower lobe lung nodule. I recommended for the patient to continue her current treatment with maintenance Tecentriq and she will proceed with cycle #21 today. The patient will come back for follow-up visit in 3 weeks for evaluation before the next cycle of her treatment. She was advised to call immediately if she has any concerning symptoms in the interval.  Disclaimer: This note was dictated with voice recognition software. Similar sounding words can inadvertently be transcribed and may be  missed upon review. Eilleen Kempf, Tammy 11/05/20

## 2020-11-05 ENCOUNTER — Encounter: Payer: Self-pay | Admitting: Physician Assistant

## 2020-11-05 ENCOUNTER — Inpatient Hospital Stay: Payer: Medicare Other

## 2020-11-05 ENCOUNTER — Other Ambulatory Visit: Payer: Self-pay

## 2020-11-05 ENCOUNTER — Inpatient Hospital Stay: Payer: Medicare Other | Attending: Internal Medicine | Admitting: Physician Assistant

## 2020-11-05 VITALS — HR 94 | Resp 16

## 2020-11-05 VITALS — BP 111/80 | HR 107 | Temp 98.6°F | Resp 13 | Ht 66.0 in | Wt 212.0 lb

## 2020-11-05 DIAGNOSIS — C771 Secondary and unspecified malignant neoplasm of intrathoracic lymph nodes: Secondary | ICD-10-CM | POA: Diagnosis not present

## 2020-11-05 DIAGNOSIS — Z79899 Other long term (current) drug therapy: Secondary | ICD-10-CM | POA: Insufficient documentation

## 2020-11-05 DIAGNOSIS — C3491 Malignant neoplasm of unspecified part of right bronchus or lung: Secondary | ICD-10-CM

## 2020-11-05 DIAGNOSIS — R110 Nausea: Secondary | ICD-10-CM | POA: Diagnosis not present

## 2020-11-05 DIAGNOSIS — F17210 Nicotine dependence, cigarettes, uncomplicated: Secondary | ICD-10-CM | POA: Insufficient documentation

## 2020-11-05 DIAGNOSIS — R5383 Other fatigue: Secondary | ICD-10-CM

## 2020-11-05 DIAGNOSIS — C3431 Malignant neoplasm of lower lobe, right bronchus or lung: Secondary | ICD-10-CM | POA: Insufficient documentation

## 2020-11-05 DIAGNOSIS — Z5112 Encounter for antineoplastic immunotherapy: Secondary | ICD-10-CM | POA: Insufficient documentation

## 2020-11-05 DIAGNOSIS — J449 Chronic obstructive pulmonary disease, unspecified: Secondary | ICD-10-CM | POA: Diagnosis not present

## 2020-11-05 DIAGNOSIS — Z95828 Presence of other vascular implants and grafts: Secondary | ICD-10-CM

## 2020-11-05 DIAGNOSIS — K5900 Constipation, unspecified: Secondary | ICD-10-CM | POA: Insufficient documentation

## 2020-11-05 LAB — CMP (CANCER CENTER ONLY)
ALT: 8 U/L (ref 0–44)
AST: 14 U/L — ABNORMAL LOW (ref 15–41)
Albumin: 3.4 g/dL — ABNORMAL LOW (ref 3.5–5.0)
Alkaline Phosphatase: 107 U/L (ref 38–126)
Anion gap: 10 (ref 5–15)
BUN: 22 mg/dL (ref 8–23)
CO2: 24 mmol/L (ref 22–32)
Calcium: 9 mg/dL (ref 8.9–10.3)
Chloride: 107 mmol/L (ref 98–111)
Creatinine: 1.22 mg/dL — ABNORMAL HIGH (ref 0.44–1.00)
GFR, Estimated: 49 mL/min — ABNORMAL LOW (ref >60)
Glucose, Bld: 124 mg/dL — ABNORMAL HIGH (ref 70–99)
Potassium: 3.7 mmol/L (ref 3.5–5.1)
Sodium: 141 mmol/L (ref 135–145)
Total Bilirubin: 0.4 mg/dL (ref 0.3–1.2)
Total Protein: 6.5 g/dL (ref 6.5–8.1)

## 2020-11-05 LAB — CBC WITH DIFFERENTIAL (CANCER CENTER ONLY)
Abs Immature Granulocytes: 0.02 10*3/uL (ref 0.00–0.07)
Basophils Absolute: 0 10*3/uL (ref 0.0–0.1)
Basophils Relative: 1 %
Eosinophils Absolute: 0.1 10*3/uL (ref 0.0–0.5)
Eosinophils Relative: 2 %
HCT: 38.9 % (ref 36.0–46.0)
Hemoglobin: 12.8 g/dL (ref 12.0–15.0)
Immature Granulocytes: 0 %
Lymphocytes Relative: 11 %
Lymphs Abs: 0.6 10*3/uL — ABNORMAL LOW (ref 0.7–4.0)
MCH: 30 pg (ref 26.0–34.0)
MCHC: 32.9 g/dL (ref 30.0–36.0)
MCV: 91.3 fL (ref 80.0–100.0)
Monocytes Absolute: 0.5 10*3/uL (ref 0.1–1.0)
Monocytes Relative: 8 %
Neutro Abs: 4.6 10*3/uL (ref 1.7–7.7)
Neutrophils Relative %: 78 %
Platelet Count: 246 10*3/uL (ref 150–400)
RBC: 4.26 MIL/uL (ref 3.87–5.11)
RDW: 13.4 % (ref 11.5–15.5)
WBC Count: 5.9 10*3/uL (ref 4.0–10.5)
nRBC: 0 % (ref 0.0–0.2)

## 2020-11-05 LAB — TSH: TSH: 2.585 u[IU]/mL (ref 0.308–3.960)

## 2020-11-05 MED ORDER — SODIUM CHLORIDE 0.9 % IV SOLN
Freq: Once | INTRAVENOUS | Status: AC
  Filled 2020-11-05: qty 250

## 2020-11-05 MED ORDER — SODIUM CHLORIDE 0.9% FLUSH
10.0000 mL | Freq: Once | INTRAVENOUS | Status: AC
  Administered 2020-11-05: 10 mL
  Filled 2020-11-05: qty 10

## 2020-11-05 MED ORDER — HEPARIN SOD (PORK) LOCK FLUSH 100 UNIT/ML IV SOLN
500.0000 [IU] | Freq: Once | INTRAVENOUS | Status: AC | PRN
  Administered 2020-11-05: 500 [IU]
  Filled 2020-11-05: qty 5

## 2020-11-05 MED ORDER — SODIUM CHLORIDE 0.9 % IV SOLN
1200.0000 mg | Freq: Once | INTRAVENOUS | Status: AC
  Administered 2020-11-05: 1200 mg via INTRAVENOUS
  Filled 2020-11-05: qty 20

## 2020-11-05 MED ORDER — SODIUM CHLORIDE 0.9% FLUSH
10.0000 mL | INTRAVENOUS | Status: DC | PRN
  Administered 2020-11-05: 10 mL
  Filled 2020-11-05: qty 10

## 2020-11-05 NOTE — Patient Instructions (Signed)
[facility] Discharge Instructions for Patients Receiving Chemotherapy  Today you received the following chemotherapy agents: Tecentriq  To help prevent nausea and vomiting after your treatment, we encourage you to take your nausea medication  as prescribed.    If you develop nausea and vomiting that is not controlled by your nausea medication, call the clinic.   BELOW ARE SYMPTOMS THAT SHOULD BE REPORTED IMMEDIATELY:  *FEVER GREATER THAN 100.5 F  *CHILLS WITH OR WITHOUT FEVER  NAUSEA AND VOMITING THAT IS NOT CONTROLLED WITH YOUR NAUSEA MEDICATION  *UNUSUAL SHORTNESS OF BREATH  *UNUSUAL BRUISING OR BLEEDING  TENDERNESS IN MOUTH AND THROAT WITH OR WITHOUT PRESENCE OF ULCERS  *URINARY PROBLEMS  *BOWEL PROBLEMS  UNUSUAL RASH Items with * indicate a potential emergency and should be followed up as soon as possible.  Feel free to call the clinic should you have any questions or concerns. The clinic phone number is [phone].  Please show the [facility] at check-in to the Emergency Department and triage nurse.

## 2020-11-13 ENCOUNTER — Telehealth: Payer: Self-pay | Admitting: Physician Assistant

## 2020-11-13 NOTE — Telephone Encounter (Signed)
Called and left msg about added appts. Mailed printout

## 2020-11-17 ENCOUNTER — Telehealth: Payer: Self-pay | Admitting: Medical Oncology

## 2020-11-17 NOTE — Telephone Encounter (Signed)
Neuro symptoms reported by husband.He left message on nurse line.   Mertz thinks he is having  mini strokes and has dizziness, numbness an dspeech problems lastin 15-20 minutes. ibuprofen cal back and LVM to call 911 and get her to ED now.

## 2020-11-17 NOTE — Telephone Encounter (Deleted)
Neuro symptoms-Message left on my phone from Tammy Bowers ,  that Tammy Bowers  thinks she is having mini strokes and has , numbness /dizziness and speech problems lasting 15 min-20 minutes.   I called back and left vm to call 911 to take pt to ED now.

## 2020-11-23 DIAGNOSIS — E039 Hypothyroidism, unspecified: Secondary | ICD-10-CM | POA: Diagnosis not present

## 2020-11-23 DIAGNOSIS — R4701 Aphasia: Secondary | ICD-10-CM | POA: Diagnosis not present

## 2020-11-23 DIAGNOSIS — Z79899 Other long term (current) drug therapy: Secondary | ICD-10-CM | POA: Diagnosis not present

## 2020-11-24 ENCOUNTER — Other Ambulatory Visit: Payer: Self-pay | Admitting: Family Medicine

## 2020-11-24 DIAGNOSIS — R4701 Aphasia: Secondary | ICD-10-CM

## 2020-11-24 NOTE — Progress Notes (Signed)
[facility] OFFICE PROGRESS NOTE  Koirala, Dibas, MD [address] [address]  DIAGNOSIS: Recurrent small cell lung cancer initially diagnosed as Limited stage (T2b, N2, M0) small cell lung cancer diagnosed in November 2017 and presented with large right hilar mass with mediastinal invasion and a right lower lobe pulmonary nodule. The patient had disease recurrence in the right lower lobe and mediastinal lymph nodes in February 2020.  PRIOR THERAPY:  1) Systemic chemotherapy with cisplatin 60 MG/M2 on day 1 and etoposide 120 MG/M2 on days 1, 2 and 3 concurrent with radiation status post 6 cycles. Last dose was giving March 06, 2017. 2) status post prophylactic cranial irradiation. 3) palliative radiotherapy to the left hilar obstructive soft tissue mass under the care of Dr. Tammi Klippel.  CURRENT THERAPY: Systemic chemotherapy with carboplatin for AUC of 5 on day 1, etoposide. 100 mg/M2 on days 1, 2 and 3 as well as Tecentriq 1200 mg IV with Neulasta support every 3 weeks. First dose March 05, 2019. Status post 21cycles. Starting from cycle 5, the patient will be treated with single agent Tecentriq 1200 mg IV every 3 weeks. First dose May 28, 2019. The patient also received few cycles of immunotherapy in [place].  INTERVAL HISTORY: Tammy Bowers 69 y.o. female returns to clinic today for a follow-up visit accompanied by her husband.  The patient is feeling fair today without any concerning complaints. In the interval since her last appointment, the patient called the clinic on 11/17/2020 with concerns for dizziness, numbness, and speech problems.  The patient thought that she was having mini strokes and she was instructed to call 911. Did not go to the emergency room. She called her neurologist and is scheduled to have an MRI on 12/13/20. The patient has been having these symptoms for over a year. She was last seen for these symptoms in a hospital in [place]  in July 2021. No strokes or evidence for metastatic disease was found. She does not take a baby aspirin. It was thought that her symptoms were reportedly attributed to migraines.   Since that time, the patient still reports intermittent periods of numbness in her left arm and or leg. She sometimes also reports speech disturbances. Her symptoms last for a few minutes to 1-2 hours, according to her husband before it subsides.   Today, she denies any fever, chills, night sweats, or weight loss.  She denies any hemoptysis.  She reports her baseline dyspnea on exertion and cough and she is wondering what she can do to help her lung function. If her cough gets "bad" she will use over-the-counter cough medications.  The patient continues to smoke cigarettes and is not interested in quitting.  She denies any hemoptysis.  She continues to report a stable occasional band like pain under her right breast. She denies any vomiting, nausea, vomiting, or diarrhea. The patient denies any rashes or skin changes.  The patient is here today for evaluation before starting cycle #22 of her treatment.  MEDICAL HISTORY: Past Medical History:  Diagnosis Date  . Adenopathy 10/10/2016   PERICARINAL  . Anemia   . Arthritis   . Asthma   . Cancer ([place])    lung cancer Small Cell  . COPD (chronic obstructive pulmonary disease) ([place])   . Dyspnea   . Headache    migraines  . Hilar mass 10/10/2016   RIGHT  . History of kidney stones   . Insomnia   . Lung  nodules 10/10/2016   RIGHT LOWER LOBE  . Mass of both adrenal glands ([place]) 10/10/2016  . Mood swing   . Pneumonia   . Spinal headache   . Substance abuse ([place])    TOBACCO  . UNSPECIFIED INFECTION OF BONE ANKLE AND FOOT 10/22/2009   Annotation: left ankle Qualifier: Diagnosis of  By: Patsy Baltimore RN, Denise      ALLERGIES:  is allergic to lidocaine.  MEDICATIONS:  Current Outpatient Medications  Medication Sig Dispense Refill  . albuterol (PROVENTIL HFA;VENTOLIN  HFA) 108 (90 BASE) MCG/ACT inhaler Inhale 2 puffs into the lungs every 4 (four) hours as needed for wheezing.    . clonazePAM (KLONOPIN) 0.5 MG tablet     . Cyclobenzaprine HCl (FLEXERIL PO) Take 5 mg by mouth 3 (three) times daily as needed (muscle spasms).     . diphenoxylate-atropine (LOMOTIL) 2.5-0.025 MG tablet Take 1 tablet by mouth 4 (four) times daily as needed for diarrhea or loose stools. 30 tablet 0  . Fluticasone-Salmeterol (ADVAIR) 500-50 MCG/DOSE AEPB Inhale 1 puff into the lungs 2 (two) times daily.    Marland Kitchen HYDROcodone-acetaminophen (HYCET) 7.5-325 mg/15 ml solution Take 10 mLs by mouth 4 (four) times daily as needed for moderate pain (use before meals and at bedtime prn). 473 mL 0  . ibuprofen (ADVIL) 800 MG tablet Take 800 mg by mouth every 8 (eight) hours as needed.    Marland Kitchen levothyroxine (SYNTHROID) 100 MCG tablet Take 1 tablet (100 mcg total) by mouth daily. 30 tablet 2  . Multiple Vitamin (MULTIVITAMIN WITH MINERALS) TABS tablet Take 1 tablet by mouth daily.     Marland Kitchen OVER THE COUNTER MEDICATION 1 capsule. [place] tail-"mushroom in a capsule"     . prochlorperazine (COMPAZINE) 10 MG tablet TAKE 1 TABLET(10 MG) BY MOUTH EVERY 6 HOURS AS NEEDED FOR NAUSEA OR VOMITING 30 tablet 0  . sucralfate (CARAFATE) 1 g tablet Take 1 tablet (1 g total) by mouth 4 (four) times daily -  with meals and at bedtime. Dissolve tablet in 4oz of water and drink 60 tablet 0   No current facility-administered medications for this visit.    SURGICAL HISTORY:  Past Surgical History:  Procedure Laterality Date  . ANKLE SURGERY     hardware removal, and initial ankle surgery  . APPENDECTOMY    . IR IMAGING GUIDED PORT INSERTION  04/12/2019  . LUNG BIOPSY N/A 10/19/2016   Procedure: LUNG BIOPSY;  Surgeon: Grace Isaac, MD;  Location: [facility];  Service: Thoracic;  Laterality: N/A;  . LUNG BIOPSY N/A 02/20/2019   Procedure: LUNG BIOPSY;  Surgeon: Grace Isaac, MD;  Location: [facility];  Service: Thoracic;   Laterality: N/A;  . TONSILLECTOMY    . VIDEO BRONCHOSCOPY WITH ENDOBRONCHIAL ULTRASOUND N/A 10/19/2016   Procedure: VIDEO BRONCHOSCOPY WITH ENDOBRONCHIAL ULTRASOUND;  Surgeon: Grace Isaac, MD;  Location: [facility];  Service: Thoracic;  Laterality: N/A;  . VIDEO BRONCHOSCOPY WITH ENDOBRONCHIAL ULTRASOUND N/A 02/20/2019   Procedure: VIDEO BRONCHOSCOPY WITH ENDOBRONCHIAL ULTRASOUND;  Surgeon: Grace Isaac, MD;  Location: [facility];  Service: Thoracic;  Laterality: N/A;    REVIEW OF SYSTEMS:   Review of Systems  Constitutional: Negative for appetite change, chills, fatigue, fever and unexpected weight change.  HENT: Negative for mouth sores, nosebleeds, sore throat and trouble swallowing.   Eyes: Negative for eye problems and icterus.  Respiratory: Positive for baseline cough. Negative for hemoptysis, shortness of breath and wheezing.   Cardiovascular: Positive for occasional right sided pain  under her right breast. Negative for leg swelling.  Gastrointestinal: Negative for abdominal pain, constipation, diarrhea, nausea and vomiting.  Genitourinary: Negative for bladder incontinence, difficulty urinating, dysuria, frequency and hematuria.   Musculoskeletal: Negative for back pain, gait problem, neck pain and neck stiffness.  Skin: Negative for itching and rash.  Neurological: Positive for occasional headaches, speech disturbances, and left extremity numbness. Negative for dizziness, gait problem, light-headedness and seizures.  Hematological: Negative for adenopathy. Does not bruise/bleed easily.  Psychiatric/Behavioral: Negative for confusion, depression and sleep disturbance. The patient is not nervous/anxious.     PHYSICAL EXAMINATION:  Blood pressure 118/83, pulse (!) 107, temperature 97.8 F (36.6 C), temperature source Tympanic, resp. rate 18, height 5\' 6"  (1.676 m), weight 216 lb 3.2 oz (98.1 kg), SpO2 96 %.  ECOG PERFORMANCE STATUS: 1 - Symptomatic but completely  ambulatory  Physical Exam  Constitutional: Oriented to person, place, and time and well-developed, well-nourished, and in no distress.  HENT:  Head: Normocephalic and atraumatic.  Mouth/Throat: Oropharynx is clear and moist. No oropharyngeal exudate.  Eyes: Conjunctivae are normal. Right eye exhibits no discharge. Left eye exhibits no discharge. No scleral icterus.  Neck: Normal range of motion. Neck supple.  Cardiovascular: Normal rate, regular rhythm, normal heart sounds and intact distal pulses.   Pulmonary/Chest: Effort normal and breath sounds normal. No respiratory distress. No wheezes. No rales.  Abdominal: Soft. Bowel sounds are normal. Exhibits no distension and no mass. There is no tenderness.  Musculoskeletal: Normal range of motion. Exhibits no edema.  Lymphadenopathy:    No cervical adenopathy.  Neurological: Alert and oriented to person, place, and time. Exhibits normal muscle tone. Examined in the wheelchair. Cranial Nerves II-XII intact. Muscle strength equal and symmetric.   Skin: Skin is warm and dry. No rash noted. Not diaphoretic. No erythema. No pallor.  Psychiatric: Mood, memory and judgment normal.  Vitals reviewed.  LABORATORY DATA: Lab Results  Component Value Date   WBC 6.3 11/25/2020   HGB 13.3 11/25/2020   HCT 39.7 11/25/2020   MCV 91.9 11/25/2020   PLT 285 11/25/2020      Chemistry      Component Value Date/Time   NA 141 11/25/2020 1103   NA 139 11/24/2017 0913   K 3.7 11/25/2020 1103   K 3.8 11/24/2017 0913   CL 106 11/25/2020 1103   CO2 23 11/25/2020 1103   CO2 24 11/24/2017 0913   BUN 24 (H) 11/25/2020 1103   BUN 22.1 11/24/2017 0913   CREATININE 1.21 (H) 11/25/2020 1103   CREATININE 1.2 (H) 11/24/2017 0913      Component Value Date/Time   CALCIUM 9.1 11/25/2020 1103   CALCIUM 9.7 11/24/2017 0913   ALKPHOS 110 11/25/2020 1103   ALKPHOS 99 11/24/2017 0913   AST 15 11/25/2020 1103   AST 15 11/24/2017 0913   ALT 8 11/25/2020 1103   ALT  7 11/24/2017 0913   BILITOT 0.4 11/25/2020 1103   BILITOT 0.37 11/24/2017 0913       RADIOGRAPHIC STUDIES:  CT Chest W Contrast  Result Date: 11/03/2020 CLINICAL DATA:  Restaging of small cell lung cancer, immunotherapy in progress, prior chemotherapy and radiation therapy. Chronic shortness of breath and cough with nausea and constipation. EXAM: CT CHEST, ABDOMEN, AND PELVIS WITH CONTRAST TECHNIQUE: Multidetector CT imaging of the chest, abdomen and pelvis was performed following the standard protocol during bolus administration of intravenous contrast. CONTRAST:  113mL OMNIPAQUE IOHEXOL 300 MG/ML  SOLN COMPARISON:  Multiple exams, including 08/04/2020 FINDINGS: CT  CHEST FINDINGS Cardiovascular: Right Port-A-Cath tip: SVC. Coronary, aortic arch, and branch vessel atherosclerotic vascular disease. Mediastinum/Nodes: Calcified lower paratracheal, subcarinal, and right hilar lymph nodes are again observed. Lungs/Pleura: Prominently reduced size of the right lower lobe nodule, currently about 1.3 by 1.3 cm on image 26 of series 2, and previously about 3.1 by 1.8 cm by my measurements. The bronchus intermedius is narrowed but no longer occluded. There is some residual right middle lobe atelectasis but the right middle lobe is no longer completely collapsed, and there is been re-aeration of the right lower lobe. Right perihilar consolidation is likely from radiation therapy in the region. Airway thickening is present, suggesting bronchitis or reactive airways disease. Musculoskeletal: Unremarkable CT ABDOMEN PELVIS FINDINGS Hepatobiliary: Stable 3 mm hypodense lesion inferiorly in the right hepatic lobe on image 65 of series 2, highly likely to be incidental. Gallbladder unremarkable. Pancreas: Unremarkable Spleen: Is punctate calcification compatible with old granulomatous disease. Adrenals/Urinary Tract: 3.5 by 3.0 cm right adrenal mass appears stable. Small partially exophytic hypodense lesion from the  left kidney upper pole measuring 0.7 cm in diameter on image 59 of series 2 is technically too small to characterize. Stomach/Bowel: Unremarkable Vascular/Lymphatic: Aortoiliac atherosclerotic vascular disease. Reproductive: Unremarkable Other: No supplemental non-categorized findings. Musculoskeletal: Unremarkable IMPRESSION: 1. Prominently reduced size of the right lower lobe nodule, with some minimal residual right middle lobe atelectasis, but otherwise re-expansion of the right middle lobe and right lower lobe. Right perihilar consolidation is likely from radiation therapy in the region. 2. Stable right adrenal mass, previously characterized as an adenoma. 3. Airway thickening is present, suggesting bronchitis or reactive airways disease. 4. Coronary, aortic arch, and branch vessel atherosclerotic vascular disease. 5. Old granulomatous disease. 6. Small partially exophytic hypodense lesion from the left kidney upper pole is technically too small to characterize. 7. Stable 3 mm hypodense lesion inferiorly in the right hepatic lobe, highly likely to be incidental. 8. Aortic atherosclerosis. Aortic Atherosclerosis (ICD10-I70.0). Electronically Signed   By: Van Clines M.D.   On: 11/03/2020 14:55   CT Abdomen Pelvis W Contrast  Result Date: 11/03/2020 CLINICAL DATA:  Restaging of small cell lung cancer, immunotherapy in progress, prior chemotherapy and radiation therapy. Chronic shortness of breath and cough with nausea and constipation. EXAM: CT CHEST, ABDOMEN, AND PELVIS WITH CONTRAST TECHNIQUE: Multidetector CT imaging of the chest, abdomen and pelvis was performed following the standard protocol during bolus administration of intravenous contrast. CONTRAST:  13mL OMNIPAQUE IOHEXOL 300 MG/ML  SOLN COMPARISON:  Multiple exams, including 08/04/2020 FINDINGS: CT CHEST FINDINGS Cardiovascular: Right Port-A-Cath tip: SVC. Coronary, aortic arch, and branch vessel atherosclerotic vascular disease.  Mediastinum/Nodes: Calcified lower paratracheal, subcarinal, and right hilar lymph nodes are again observed. Lungs/Pleura: Prominently reduced size of the right lower lobe nodule, currently about 1.3 by 1.3 cm on image 26 of series 2, and previously about 3.1 by 1.8 cm by my measurements. The bronchus intermedius is narrowed but no longer occluded. There is some residual right middle lobe atelectasis but the right middle lobe is no longer completely collapsed, and there is been re-aeration of the right lower lobe. Right perihilar consolidation is likely from radiation therapy in the region. Airway thickening is present, suggesting bronchitis or reactive airways disease. Musculoskeletal: Unremarkable CT ABDOMEN PELVIS FINDINGS Hepatobiliary: Stable 3 mm hypodense lesion inferiorly in the right hepatic lobe on image 65 of series 2, highly likely to be incidental. Gallbladder unremarkable. Pancreas: Unremarkable Spleen: Is punctate calcification compatible with old granulomatous disease. Adrenals/Urinary Tract: 3.5 by  3.0 cm right adrenal mass appears stable. Small partially exophytic hypodense lesion from the left kidney upper pole measuring 0.7 cm in diameter on image 59 of series 2 is technically too small to characterize. Stomach/Bowel: Unremarkable Vascular/Lymphatic: Aortoiliac atherosclerotic vascular disease. Reproductive: Unremarkable Other: No supplemental non-categorized findings. Musculoskeletal: Unremarkable IMPRESSION: 1. Prominently reduced size of the right lower lobe nodule, with some minimal residual right middle lobe atelectasis, but otherwise re-expansion of the right middle lobe and right lower lobe. Right perihilar consolidation is likely from radiation therapy in the region. 2. Stable right adrenal mass, previously characterized as an adenoma. 3. Airway thickening is present, suggesting bronchitis or reactive airways disease. 4. Coronary, aortic arch, and branch vessel atherosclerotic vascular  disease. 5. Old granulomatous disease. 6. Small partially exophytic hypodense lesion from the left kidney upper pole is technically too small to characterize. 7. Stable 3 mm hypodense lesion inferiorly in the right hepatic lobe, highly likely to be incidental. 8. Aortic atherosclerosis. Aortic Atherosclerosis (ICD10-I70.0). Electronically Signed   By: Van Clines M.D.   On: 11/03/2020 14:55     ASSESSMENT/PLAN:  This is a very pleasant 69 year old Caucasian female with recurrent small cell lung cancer.She was initially diagnosed as limited stage (T2b, N2, M0)in 2017. She presented with alarge right hilar mass with mediastinal invasion and a right lower lobe pulmonary nodule.She is status post a course of systemic chemotherapy with cisplatin and etoposide for 6 cycles with concurrent radiation. She tolerated treatment well except for fatigue.  She also received prophylactic cranial irradiation. She had been on observation since March 2018.  Recent imaging studies from February 2020 showed evidence of disease recurrence within right lower lobe pulmonary nodules in addition to mediastinal lymphadenopathy. A repeat bronchoscopy with endobronchial ultrasound and biopsy confirmed recurrence of small cell lung cancer.  She is currently undergoing systemic chemotherapy with carboplatin, etoposide, and Tecentriq.She is status post21cycles.Starting from cycle #5 the patient has beenreceiving single agent maintenance Tecentriq. She has received some of her treatment in [place].    Labs were reviewed.  Recommend that she proceed cycle #22 today scheduled.  We will see her back for follow-up visit in 3 weeks for evaluation before starting cycle #23.  Regarding her ongoing stroke like symptoms, I discussed this with Dr. Julien Nordmann. We would recommend that she have an MRI of the brain to also rule out metastatic disease to the brain, which has been ordered by her neurologist. She is scheduled  to see her neurologist tomorrow. I discussed that she likely should be taking a 81 mg aspirin, but should discuss this with her neurologist tomorrow. I also advised on smoking cessation for primary prevention. Discussed that if she should develop stroke like symptoms in the interval, that we would strongly recommend that she be evaluated in the emergency room.   Regarding her question how to improve her breathing, I re-iterated/encouraged smoking cessation. Her last scan noted radiation changes, airway disease, and some atelectasis. The patient was given an incentive spirometer today. She also has inhalers as prescribed by her PCP.   The patient was advised to call immediately if she has any concerning symptoms in the interval. The patient voices understanding of current disease status and treatment options and is in agreement with the current care plan. All questions were answered. The patient knows to call the clinic with any problems, questions or concerns. We can certainly see the patient much sooner if necessary       No orders of the defined types were  placed in this encounter.    Alitzel Cookson L Teri Legacy, PA-C 11/25/20

## 2020-11-25 ENCOUNTER — Other Ambulatory Visit: Payer: Self-pay

## 2020-11-25 ENCOUNTER — Inpatient Hospital Stay: Payer: Medicare Other

## 2020-11-25 ENCOUNTER — Inpatient Hospital Stay: Payer: Medicare Other | Attending: Internal Medicine | Admitting: Physician Assistant

## 2020-11-25 VITALS — HR 93

## 2020-11-25 VITALS — BP 118/83 | HR 107 | Temp 97.8°F | Resp 18 | Ht 66.0 in | Wt 216.2 lb

## 2020-11-25 DIAGNOSIS — Z9221 Personal history of antineoplastic chemotherapy: Secondary | ICD-10-CM | POA: Diagnosis not present

## 2020-11-25 DIAGNOSIS — R479 Unspecified speech disturbances: Secondary | ICD-10-CM | POA: Diagnosis not present

## 2020-11-25 DIAGNOSIS — Z791 Long term (current) use of non-steroidal anti-inflammatories (NSAID): Secondary | ICD-10-CM | POA: Insufficient documentation

## 2020-11-25 DIAGNOSIS — C3491 Malignant neoplasm of unspecified part of right bronchus or lung: Secondary | ICD-10-CM

## 2020-11-25 DIAGNOSIS — R200 Anesthesia of skin: Secondary | ICD-10-CM | POA: Insufficient documentation

## 2020-11-25 DIAGNOSIS — Z923 Personal history of irradiation: Secondary | ICD-10-CM | POA: Insufficient documentation

## 2020-11-25 DIAGNOSIS — M1990 Unspecified osteoarthritis, unspecified site: Secondary | ICD-10-CM | POA: Insufficient documentation

## 2020-11-25 DIAGNOSIS — Z5112 Encounter for antineoplastic immunotherapy: Secondary | ICD-10-CM | POA: Diagnosis not present

## 2020-11-25 DIAGNOSIS — C3431 Malignant neoplasm of lower lobe, right bronchus or lung: Secondary | ICD-10-CM | POA: Diagnosis not present

## 2020-11-25 DIAGNOSIS — J449 Chronic obstructive pulmonary disease, unspecified: Secondary | ICD-10-CM | POA: Diagnosis not present

## 2020-11-25 DIAGNOSIS — E279 Disorder of adrenal gland, unspecified: Secondary | ICD-10-CM | POA: Insufficient documentation

## 2020-11-25 DIAGNOSIS — E039 Hypothyroidism, unspecified: Secondary | ICD-10-CM | POA: Insufficient documentation

## 2020-11-25 DIAGNOSIS — Z79899 Other long term (current) drug therapy: Secondary | ICD-10-CM | POA: Diagnosis not present

## 2020-11-25 DIAGNOSIS — J9811 Atelectasis: Secondary | ICD-10-CM | POA: Insufficient documentation

## 2020-11-25 DIAGNOSIS — Z7951 Long term (current) use of inhaled steroids: Secondary | ICD-10-CM | POA: Diagnosis not present

## 2020-11-25 DIAGNOSIS — F17210 Nicotine dependence, cigarettes, uncomplicated: Secondary | ICD-10-CM | POA: Diagnosis not present

## 2020-11-25 DIAGNOSIS — R5383 Other fatigue: Secondary | ICD-10-CM | POA: Diagnosis not present

## 2020-11-25 LAB — CMP (CANCER CENTER ONLY)
ALT: 8 U/L (ref 0–44)
AST: 15 U/L (ref 15–41)
Albumin: 3.4 g/dL — ABNORMAL LOW (ref 3.5–5.0)
Alkaline Phosphatase: 110 U/L (ref 38–126)
Anion gap: 12 (ref 5–15)
BUN: 24 mg/dL — ABNORMAL HIGH (ref 8–23)
CO2: 23 mmol/L (ref 22–32)
Calcium: 9.1 mg/dL (ref 8.9–10.3)
Chloride: 106 mmol/L (ref 98–111)
Creatinine: 1.21 mg/dL — ABNORMAL HIGH (ref 0.44–1.00)
GFR, Estimated: 49 mL/min — ABNORMAL LOW (ref >60)
Glucose, Bld: 103 mg/dL — ABNORMAL HIGH (ref 70–99)
Potassium: 3.7 mmol/L (ref 3.5–5.1)
Sodium: 141 mmol/L (ref 135–145)
Total Bilirubin: 0.4 mg/dL (ref 0.3–1.2)
Total Protein: 6.9 g/dL (ref 6.5–8.1)

## 2020-11-25 LAB — CBC WITH DIFFERENTIAL (CANCER CENTER ONLY)
Abs Immature Granulocytes: 0.01 10*3/uL (ref 0.00–0.07)
Basophils Absolute: 0.1 10*3/uL (ref 0.0–0.1)
Basophils Relative: 1 %
Eosinophils Absolute: 0.1 10*3/uL (ref 0.0–0.5)
Eosinophils Relative: 2 %
HCT: 39.7 % (ref 36.0–46.0)
Hemoglobin: 13.3 g/dL (ref 12.0–15.0)
Immature Granulocytes: 0 %
Lymphocytes Relative: 10 %
Lymphs Abs: 0.6 10*3/uL — ABNORMAL LOW (ref 0.7–4.0)
MCH: 30.8 pg (ref 26.0–34.0)
MCHC: 33.5 g/dL (ref 30.0–36.0)
MCV: 91.9 fL (ref 80.0–100.0)
Monocytes Absolute: 0.5 10*3/uL (ref 0.1–1.0)
Monocytes Relative: 7 %
Neutro Abs: 5 10*3/uL (ref 1.7–7.7)
Neutrophils Relative %: 80 %
Platelet Count: 285 10*3/uL (ref 150–400)
RBC: 4.32 MIL/uL (ref 3.87–5.11)
RDW: 13 % (ref 11.5–15.5)
WBC Count: 6.3 10*3/uL (ref 4.0–10.5)
nRBC: 0 % (ref 0.0–0.2)

## 2020-11-25 LAB — TSH: TSH: 3.191 u[IU]/mL (ref 0.308–3.960)

## 2020-11-25 MED ORDER — SODIUM CHLORIDE 0.9% FLUSH
10.0000 mL | INTRAVENOUS | Status: DC | PRN
  Administered 2020-11-25: 10 mL
  Filled 2020-11-25: qty 10

## 2020-11-25 MED ORDER — SODIUM CHLORIDE 0.9 % IV SOLN
1200.0000 mg | Freq: Once | INTRAVENOUS | Status: AC
  Administered 2020-11-25: 1200 mg via INTRAVENOUS
  Filled 2020-11-25: qty 20

## 2020-11-25 MED ORDER — SODIUM CHLORIDE 0.9 % IV SOLN
Freq: Once | INTRAVENOUS | Status: AC
  Filled 2020-11-25: qty 250

## 2020-11-25 MED ORDER — HEPARIN SOD (PORK) LOCK FLUSH 100 UNIT/ML IV SOLN
500.0000 [IU] | Freq: Once | INTRAVENOUS | Status: AC | PRN
  Administered 2020-11-25: 500 [IU]
  Filled 2020-11-25: qty 5

## 2020-11-25 NOTE — Patient Instructions (Signed)
[facility] Discharge Instructions for Patients Receiving Chemotherapy  Today you received the following chemotherapy agents: Tecentriq  To help prevent nausea and vomiting after your treatment, we encourage you to take your nausea medication  as prescribed.    If you develop nausea and vomiting that is not controlled by your nausea medication, call the clinic.   BELOW ARE SYMPTOMS THAT SHOULD BE REPORTED IMMEDIATELY:  *FEVER GREATER THAN 100.5 F  *CHILLS WITH OR WITHOUT FEVER  NAUSEA AND VOMITING THAT IS NOT CONTROLLED WITH YOUR NAUSEA MEDICATION  *UNUSUAL SHORTNESS OF BREATH  *UNUSUAL BRUISING OR BLEEDING  TENDERNESS IN MOUTH AND THROAT WITH OR WITHOUT PRESENCE OF ULCERS  *URINARY PROBLEMS  *BOWEL PROBLEMS  UNUSUAL RASH Items with * indicate a potential emergency and should be followed up as soon as possible.  Feel free to call the clinic should you have any questions or concerns. The clinic phone number is [phone].  Please show the [place] at check-in to the Emergency Department and triage nurse.

## 2020-11-26 ENCOUNTER — Ambulatory Visit (INDEPENDENT_AMBULATORY_CARE_PROVIDER_SITE_OTHER): Payer: Medicare Other | Admitting: Neurology

## 2020-11-26 ENCOUNTER — Encounter: Payer: Self-pay | Admitting: Neurology

## 2020-11-26 DIAGNOSIS — G459 Transient cerebral ischemic attack, unspecified: Secondary | ICD-10-CM | POA: Diagnosis not present

## 2020-11-26 MED ORDER — TOPIRAMATE 25 MG PO TABS: ORAL_TABLET | ORAL | 3 refills | Status: DC

## 2020-11-26 NOTE — Patient Instructions (Signed)
We will start Topamax.  Topamax (topiramate) is a seizure medication that has an FDA approval for seizures and for migraine headache. Potential side effects of this medication include weight loss, cognitive slowing, tingling in the fingers and toes, and carbonated drinks will taste bad. If any significant side effects are noted on this drug, please contact our office.

## 2020-11-26 NOTE — Progress Notes (Signed)
Reason for visit: Transient numbness and weakness, aphasia  Referring physician: Dr. [name] is a 69 y.o. female  History of present illness:  Ms. Tammy Bowers is a 69 year old left-handed white female with a history of lung cancer that was diagnosed 5 or 6 years ago.  The patient had whole brain radiation around that time.  The patient indicates that she had stage III lung cancer.  The cancer has recurred, but has not metastasized outside of the chest.  She began having episodes of TIA type events that began about 1 year ago.  The patient has had a total of 5 such events, the last 1 occurring within the last week or 2 of this visit.  The episodes are stereotyped in nature, they always start with some numbness involving the left hand with a gradual spread taking 45 seconds to 1 minute to spread to the entire arm and then down the left leg excluding the face and body.  The patient will then have problems with aphasia, she is unable to get the right words out.  This may last about 30 minutes and then gradually improve, the patient completely resolves her deficit within 1 hour.  The patient denies any headaches associated with this.  The patient does have a history of migraine in the past, she recalls that her migraine headaches when they did occur also were associated with episodes of numbness and some speech changes.  The patient was in [place] in the summer, she had an episode there and underwent a CT angiogram that was within normal limits.  The patient does have extensive white matter changes associated with a prior whole brain radiation.  She has been set up for another MRI of the brain to be done on 14 December 2020.  The patient is sent to this office for further evaluation.  Between episodes, the patient feels relatively normal.  Past Medical History:  Diagnosis Date  . Adenopathy 10/10/2016   PERICARINAL  . Anemia   . Arthritis   . Asthma   . Cancer ([place])    lung cancer  Small Cell  . COPD (chronic obstructive pulmonary disease) ([place])   . Dyspnea   . Headache    migraines  . Hilar mass 10/10/2016   RIGHT  . History of kidney stones   . Insomnia   . Lung nodules 10/10/2016   RIGHT LOWER LOBE  . Mass of both adrenal glands ([place]) 10/10/2016  . Mood swing   . Pneumonia   . Spinal headache   . Substance abuse ([place])    TOBACCO  . UNSPECIFIED INFECTION OF BONE ANKLE AND FOOT 10/22/2009   Annotation: left ankle Qualifier: Diagnosis of  By: Gustavo Lah      Past Surgical History:  Procedure Laterality Date  . ANKLE SURGERY     hardware removal, and initial ankle surgery  . APPENDECTOMY    . IR IMAGING GUIDED PORT INSERTION  04/12/2019  . LUNG BIOPSY N/A 10/19/2016   Procedure: LUNG BIOPSY;  Surgeon: Grace Isaac, MD;  Location: [facility];  Service: Thoracic;  Laterality: N/A;  . LUNG BIOPSY N/A 02/20/2019   Procedure: LUNG BIOPSY;  Surgeon: Grace Isaac, MD;  Location: [facility];  Service: Thoracic;  Laterality: N/A;  . TONSILLECTOMY    . VIDEO BRONCHOSCOPY WITH ENDOBRONCHIAL ULTRASOUND N/A 10/19/2016   Procedure: VIDEO BRONCHOSCOPY WITH ENDOBRONCHIAL ULTRASOUND;  Surgeon: Grace Isaac, MD;  Location: [facility];  Service: Thoracic;  Laterality:  N/A;  . VIDEO BRONCHOSCOPY WITH ENDOBRONCHIAL ULTRASOUND N/A 02/20/2019   Procedure: VIDEO BRONCHOSCOPY WITH ENDOBRONCHIAL ULTRASOUND;  Surgeon: Grace Isaac, MD;  Location: [facility] OR;  Service: Thoracic;  Laterality: N/A;    Family History  Problem Relation Age of Onset  . Heart disease Father   . Colon cancer Father   . Heart disease Paternal Grandfather   . Ovarian cancer Mother     Social history:  reports that she has been smoking cigarettes. She has a 50.00 pack-year smoking history. She has never used smokeless tobacco. She reports current alcohol use of about 7.0 standard drinks of alcohol per week. She reports previous drug use. Drug: Marijuana.  Medications:  Prior to Admission  medications   Medication Sig Start Date End Date Taking? Authorizing Provider  albuterol (PROVENTIL HFA;VENTOLIN HFA) 108 (90 BASE) MCG/ACT inhaler Inhale 2 puffs into the lungs every 4 (four) hours as needed for wheezing.   Yes [provider]  clonazePAM (KLONOPIN) 0.5 MG tablet  10/04/20  Yes [provider]  Cyclobenzaprine HCl (FLEXERIL PO) Take 5 mg by mouth 3 (three) times daily as needed (muscle spasms).  11/09/16  Yes [provider]  diphenoxylate-atropine (LOMOTIL) 2.5-0.025 MG tablet Take 1 tablet by mouth 4 (four) times daily as needed for diarrhea or loose stools. 03/30/19  Yes Wyatt Portela, MD  Fluticasone-Salmeterol (ADVAIR) 500-50 MCG/DOSE AEPB Inhale 1 puff into the lungs 2 (two) times daily.   Yes [provider]  HYDROcodone-acetaminophen (HYCET) 7.5-325 mg/15 ml solution Take 10 mLs by mouth 4 (four) times daily as needed for moderate pain (use before meals and at bedtime prn). 08/26/20  Yes Bruning, Ashlyn, PA-C  ibuprofen (ADVIL) 800 MG tablet Take 800 mg by mouth every 8 (eight) hours as needed.   Yes [provider]  levothyroxine (SYNTHROID) 100 MCG tablet Take 1 tablet (100 mcg total) by mouth daily. 09/08/20  Yes Curt Bears, MD  Multiple Vitamin (MULTIVITAMIN WITH MINERALS) TABS tablet Take 1 tablet by mouth daily.    Yes [provider]  OVER THE COUNTER MEDICATION 1 capsule. [place] tail-"mushroom in a capsule"    Yes [provider]  prochlorperazine (COMPAZINE) 10 MG tablet TAKE 1 TABLET(10 MG) BY MOUTH EVERY 6 HOURS AS NEEDED FOR NAUSEA OR VOMITING 03/08/19  Yes Curt Bears, MD  sucralfate (CARAFATE) 1 g tablet Take 1 tablet (1 g total) by mouth 4 (four) times daily -  with meals and at bedtime. Dissolve tablet in 4oz of water and drink 08/26/20  Yes Bruning, Ashlyn, PA-C      Allergies  Allergen Reactions  . Lidocaine Swelling    SWELLING REACTION UNSPECIFIED     ROS:  Out of a complete 14  system review of symptoms, the patient complains only of the following symptoms, and all other reviewed systems are negative.  Transient episodes of numbness, weakness, aphasia Mild gait instability  Blood pressure 134/87, pulse 92, height 5\' 6"  (1.676 m), weight 220 lb (99.8 kg).  Physical Exam  General: The patient is alert and cooperative at the time of the examination.  Eyes: Pupils are equal, round, and reactive to light. Discs are flat bilaterally.  Neck: The neck is supple, no carotid bruits are noted.  Respiratory: The respiratory examination is clear.  Cardiovascular: The cardiovascular examination reveals a regular rate and rhythm, no obvious murmurs or rubs are noted.  Skin: Extremities are without significant edema.  Neurologic Exam  Mental status: The patient is alert and  oriented x 3 at the time of the examination. The patient has apparent normal recent and remote memory, with an apparently normal attention span and concentration ability.  Cranial nerves: Facial symmetry is present. There is good sensation of the face to pinprick and soft touch bilaterally. The strength of the facial muscles and the muscles to head turning and shoulder shrug are normal bilaterally. Speech is well enunciated, no aphasia or dysarthria is noted. Extraocular movements are full. Visual fields are full. The tongue is midline, and the patient has symmetric elevation of the soft palate. No obvious hearing deficits are noted.  Motor: The motor testing reveals 5 over 5 strength of all 4 extremities. Good symmetric motor tone is noted throughout.  Sensory: Sensory testing is intact to pinprick, soft touch, vibration sensation, and position sense on all 4 extremities, with exception that there is some decrease in position sense in both feet, worse on the left. No evidence of extinction is noted.  Coordination: Cerebellar testing reveals good finger-nose-finger and heel-to-shin bilaterally.  Gait  and station: Gait is normal. Tandem gait is unsteady. Romberg is negative. No drift is seen.  Reflexes: Deep tendon reflexes are symmetric and normal bilaterally. Toes are downgoing bilaterally.   CTA head and neck:  IMPRESSION: No acute intracranial findings.  No significant atherosclerosis in the neck.  No intracranial large vessel occlusion.  Multiple dental caries are present.  No cervical fracture.  Moderate right foraminal narrowing at C5-C6.   Assessment/Plan:  1.  History of lung cancer  2.  History of whole brain radiation, extensive white matter disease  3.  History of migraine headache  4.  Episodes of left-sided numbness, weakness, aphasia  The patient has had 5 episodes that have been relatively identical from one episode to the next.  These are associated with onset of numbness beginning in the left hand with a gradual spread of numbness and weakness.  This is followed by aphasia.  The patient has had migraine headaches in the past with similar symptoms.  The above event could represent a migraine equivalent or could represent a seizure episode.  It is unlikely that this represents a TIA or stroke, but the patient has had prior whole brain radiation which will lead to gradually progressive small vessel changes.  The patient will have the MRI of the brain, we will give an empiric trial on Topamax starting at 25 mg twice daily for 2 weeks and then go to 50 mg twice daily.  The patient will follow up in 3 to 4 months.  Jill Alexanders MD 11/26/2020 12:16 PM  [facility] [address] [place], [place] [address]  Phone [phone] Fax [phone]

## 2020-11-27 DIAGNOSIS — Z23 Encounter for immunization: Secondary | ICD-10-CM | POA: Diagnosis not present

## 2020-11-27 DIAGNOSIS — G459 Transient cerebral ischemic attack, unspecified: Secondary | ICD-10-CM | POA: Insufficient documentation

## 2020-12-06 ENCOUNTER — Other Ambulatory Visit: Payer: Self-pay | Admitting: Internal Medicine

## 2020-12-06 DIAGNOSIS — R5382 Chronic fatigue, unspecified: Secondary | ICD-10-CM

## 2020-12-14 ENCOUNTER — Other Ambulatory Visit: Payer: Medicaid Other

## 2020-12-17 ENCOUNTER — Telehealth: Payer: Self-pay | Admitting: Internal Medicine

## 2020-12-17 ENCOUNTER — Inpatient Hospital Stay: Payer: Medicare Other

## 2020-12-17 ENCOUNTER — Other Ambulatory Visit: Payer: Self-pay

## 2020-12-17 ENCOUNTER — Inpatient Hospital Stay (HOSPITAL_BASED_OUTPATIENT_CLINIC_OR_DEPARTMENT_OTHER): Payer: Medicare Other | Admitting: Internal Medicine

## 2020-12-17 VITALS — BP 122/85 | HR 83 | Temp 97.3°F | Resp 21 | Ht 66.0 in | Wt 216.8 lb

## 2020-12-17 DIAGNOSIS — C3491 Malignant neoplasm of unspecified part of right bronchus or lung: Secondary | ICD-10-CM | POA: Diagnosis not present

## 2020-12-17 DIAGNOSIS — Z5112 Encounter for antineoplastic immunotherapy: Secondary | ICD-10-CM

## 2020-12-17 DIAGNOSIS — R5383 Other fatigue: Secondary | ICD-10-CM

## 2020-12-17 DIAGNOSIS — C3431 Malignant neoplasm of lower lobe, right bronchus or lung: Secondary | ICD-10-CM | POA: Diagnosis not present

## 2020-12-17 DIAGNOSIS — J9811 Atelectasis: Secondary | ICD-10-CM | POA: Diagnosis not present

## 2020-12-17 DIAGNOSIS — Z9221 Personal history of antineoplastic chemotherapy: Secondary | ICD-10-CM | POA: Diagnosis not present

## 2020-12-17 DIAGNOSIS — Z95828 Presence of other vascular implants and grafts: Secondary | ICD-10-CM

## 2020-12-17 DIAGNOSIS — E279 Disorder of adrenal gland, unspecified: Secondary | ICD-10-CM | POA: Diagnosis not present

## 2020-12-17 DIAGNOSIS — Z923 Personal history of irradiation: Secondary | ICD-10-CM | POA: Diagnosis not present

## 2020-12-17 LAB — CMP (CANCER CENTER ONLY)
ALT: 10 U/L (ref 0–44)
AST: 13 U/L — ABNORMAL LOW (ref 15–41)
Albumin: 3.3 g/dL — ABNORMAL LOW (ref 3.5–5.0)
Alkaline Phosphatase: 94 U/L (ref 38–126)
Anion gap: 4 — ABNORMAL LOW (ref 5–15)
BUN: 27 mg/dL — ABNORMAL HIGH (ref 8–23)
CO2: 25 mmol/L (ref 22–32)
Calcium: 9.1 mg/dL (ref 8.9–10.3)
Chloride: 112 mmol/L — ABNORMAL HIGH (ref 98–111)
Creatinine: 1.18 mg/dL — ABNORMAL HIGH (ref 0.44–1.00)
GFR, Estimated: 51 mL/min — ABNORMAL LOW (ref >60)
Glucose, Bld: 75 mg/dL (ref 70–99)
Potassium: 3.9 mmol/L (ref 3.5–5.1)
Sodium: 141 mmol/L (ref 135–145)
Total Bilirubin: 0.4 mg/dL (ref 0.3–1.2)
Total Protein: 6.5 g/dL (ref 6.5–8.1)

## 2020-12-17 LAB — CBC WITH DIFFERENTIAL (CANCER CENTER ONLY)
Abs Immature Granulocytes: 0.01 10*3/uL (ref 0.00–0.07)
Basophils Absolute: 0.1 10*3/uL (ref 0.0–0.1)
Basophils Relative: 1 %
Eosinophils Absolute: 0.2 10*3/uL (ref 0.0–0.5)
Eosinophils Relative: 4 %
HCT: 38.6 % (ref 36.0–46.0)
Hemoglobin: 12.9 g/dL (ref 12.0–15.0)
Immature Granulocytes: 0 %
Lymphocytes Relative: 14 %
Lymphs Abs: 0.7 10*3/uL (ref 0.7–4.0)
MCH: 30.7 pg (ref 26.0–34.0)
MCHC: 33.4 g/dL (ref 30.0–36.0)
MCV: 91.9 fL (ref 80.0–100.0)
Monocytes Absolute: 0.4 10*3/uL (ref 0.1–1.0)
Monocytes Relative: 8 %
Neutro Abs: 3.8 10*3/uL (ref 1.7–7.7)
Neutrophils Relative %: 73 %
Platelet Count: 259 10*3/uL (ref 150–400)
RBC: 4.2 MIL/uL (ref 3.87–5.11)
RDW: 13.1 % (ref 11.5–15.5)
WBC Count: 5.2 10*3/uL (ref 4.0–10.5)
nRBC: 0 % (ref 0.0–0.2)

## 2020-12-17 LAB — TSH: TSH: 8.529 u[IU]/mL — ABNORMAL HIGH (ref 0.308–3.960)

## 2020-12-17 MED ORDER — HEPARIN SOD (PORK) LOCK FLUSH 100 UNIT/ML IV SOLN
500.0000 [IU] | Freq: Once | INTRAVENOUS | Status: AC | PRN
  Administered 2020-12-17: 500 [IU]
  Filled 2020-12-17: qty 5

## 2020-12-17 MED ORDER — SODIUM CHLORIDE 0.9% FLUSH
10.0000 mL | INTRAVENOUS | Status: DC | PRN
  Administered 2020-12-17: 10 mL
  Filled 2020-12-17: qty 10

## 2020-12-17 MED ORDER — SODIUM CHLORIDE 0.9 % IV SOLN
Freq: Once | INTRAVENOUS | Status: AC
  Filled 2020-12-17: qty 250

## 2020-12-17 MED ORDER — SODIUM CHLORIDE 0.9 % IV SOLN
1200.0000 mg | Freq: Once | INTRAVENOUS | Status: AC
  Administered 2020-12-17: 1200 mg via INTRAVENOUS
  Filled 2020-12-17: qty 20

## 2020-12-17 MED ORDER — SODIUM CHLORIDE 0.9% FLUSH
10.0000 mL | Freq: Once | INTRAVENOUS | Status: AC
  Administered 2020-12-17: 10 mL
  Filled 2020-12-17: qty 10

## 2020-12-17 NOTE — Patient Instructions (Signed)
[facility] Discharge Instructions for Patients Receiving Chemotherapy  Today you received the following chemotherapy agents: tecentriq  To help prevent nausea and vomiting after your treatment, we encourage you to take your nausea medication as directed.    If you develop nausea and vomiting that is not controlled by your nausea medication, call the clinic.   BELOW ARE SYMPTOMS THAT SHOULD BE REPORTED IMMEDIATELY:  *FEVER GREATER THAN 100.5 F  *CHILLS WITH OR WITHOUT FEVER  NAUSEA AND VOMITING THAT IS NOT CONTROLLED WITH YOUR NAUSEA MEDICATION  *UNUSUAL SHORTNESS OF BREATH  *UNUSUAL BRUISING OR BLEEDING  TENDERNESS IN MOUTH AND THROAT WITH OR WITHOUT PRESENCE OF ULCERS  *URINARY PROBLEMS  *BOWEL PROBLEMS  UNUSUAL RASH Items with * indicate a potential emergency and should be followed up as soon as possible.  Feel free to call the clinic should you have any questions or concerns. The clinic phone number is [phone].  Please show the [facility] at check-in to the Emergency Department and triage nurse.

## 2020-12-17 NOTE — Patient Instructions (Signed)

## 2020-12-17 NOTE — Telephone Encounter (Signed)
Scheduled appt per 12/30 los - added additional cycles - pt to get an updated schedule next visit

## 2020-12-17 NOTE — Progress Notes (Signed)
[facility] [phone]   Fax:([phone]) [place], MD [address] [place] [address]  DIAGNOSIS: Recurrent small cell lung cancer initially diagnosed as Limited stage (T2b, N2, M0) small cell lung cancer diagnosed in November 2017 and presented with large right hilar mass with mediastinal invasion and a right lower lobe pulmonary nodule.  The patient had disease recurrence in the right lower lobe and mediastinal lymph nodes in February 2020.  PRIOR THERAPY:   1) Systemic chemotherapy with cisplatin 60 MG/M2 on day 1 and etoposide 120 MG/M2 on days 1, 2 and 3 concurrent with radiation status post 6 cycles.  Last dose was giving March 06, 2017. 2) status post prophylactic cranial irradiation. 3) palliative radiotherapy to the left hilar obstructive soft tissue mass under the care of Dr. Tammi Klippel.  CURRENT THERAPY: Systemic chemotherapy with carboplatin for AUC of 5 on day 1, etoposide.  100 mg/M2 on days 1, 2 and 3 as well as Tecentriq 1200 mg IV with Neulasta support every 3 weeks.  First dose March 05, 2019.  Status post 22 cycles.  Starting from cycle 5, the patient will be treated with single agent Tecentriq 1200 mg IV every 3 weeks.  First dose May 28, 2019.  The patient also received few cycles of immunotherapy in [place].  INTERVAL HISTORY: Tammy Bowers 69 y.o. female returns to the clinic today for follow-up visit.  The patient is feeling fine today with no concerning complaints except for fatigue and feeling depressed recently.  She denied having any current chest pain but has shortness of breath with exertion with no cough or hemoptysis.  She denied having any fever or chills.  She has no nausea, vomiting, diarrhea or constipation.  She has no headache or visual changes.  She is here today for evaluation before starting cycle #23 of her treatment.   MEDICAL HISTORY: Past Medical History:  Diagnosis  Date  . Adenopathy 10/10/2016   PERICARINAL  . Anemia   . Arthritis   . Asthma   . Cancer ([place])    lung cancer Small Cell  . COPD (chronic obstructive pulmonary disease) ([place])   . Dyspnea   . Headache    migraines  . Hilar mass 10/10/2016   RIGHT  . History of kidney stones   . Insomnia   . Lung nodules 10/10/2016   RIGHT LOWER LOBE  . Mass of both adrenal glands ([place]) 10/10/2016  . Mood swing   . Pneumonia   . Spinal headache   . Substance abuse ([place])    TOBACCO  . UNSPECIFIED INFECTION OF BONE ANKLE AND FOOT 10/22/2009   Annotation: left ankle Qualifier: Diagnosis of  By: Patsy Baltimore RN, Denise      ALLERGIES:  is allergic to lidocaine.  MEDICATIONS:  Current Outpatient Medications  Medication Sig Dispense Refill  . albuterol (PROVENTIL HFA;VENTOLIN HFA) 108 (90 BASE) MCG/ACT inhaler Inhale 2 puffs into the lungs every 4 (four) hours as needed for wheezing.    . clonazePAM (KLONOPIN) 0.5 MG tablet     . Cyclobenzaprine HCl (FLEXERIL PO) Take 5 mg by mouth 3 (three) times daily as needed (muscle spasms).     . diphenoxylate-atropine (LOMOTIL) 2.5-0.025 MG tablet Take 1 tablet by mouth 4 (four) times daily as needed for diarrhea or loose stools. 30 tablet 0  . Fluticasone-Salmeterol (ADVAIR) 500-50 MCG/DOSE AEPB Inhale 1 puff into the lungs 2 (two) times daily.    Marland Kitchen  HYDROcodone-acetaminophen (HYCET) 7.5-325 mg/15 ml solution Take 10 mLs by mouth 4 (four) times daily as needed for moderate pain (use before meals and at bedtime prn). 473 mL 0  . ibuprofen (ADVIL) 800 MG tablet Take 800 mg by mouth every 8 (eight) hours as needed.    Marland Kitchen levothyroxine (SYNTHROID) 100 MCG tablet TAKE 1 TABLET(100 MCG) BY MOUTH DAILY 90 tablet 1  . Multiple Vitamin (MULTIVITAMIN WITH MINERALS) TABS tablet Take 1 tablet by mouth daily.     Marland Kitchen OVER THE COUNTER MEDICATION 1 capsule. [place] tail-"mushroom in a capsule"     . prochlorperazine (COMPAZINE) 10 MG tablet TAKE 1 TABLET(10 MG) BY MOUTH EVERY 6  HOURS AS NEEDED FOR NAUSEA OR VOMITING 30 tablet 0  . sucralfate (CARAFATE) 1 g tablet Take 1 tablet (1 g total) by mouth 4 (four) times daily -  with meals and at bedtime. Dissolve tablet in 4oz of water and drink (Patient not taking: Reported on 12/17/2020) 60 tablet 0  . topiramate (TOPAMAX) 25 MG tablet One tablet twice a day for 2 weeks, then take 2 tablets twice a day 120 tablet 3   No current facility-administered medications for this visit.    SURGICAL HISTORY:  Past Surgical History:  Procedure Laterality Date  . ANKLE SURGERY     hardware removal, and initial ankle surgery  . APPENDECTOMY    . IR IMAGING GUIDED PORT INSERTION  04/12/2019  . LUNG BIOPSY N/A 10/19/2016   Procedure: LUNG BIOPSY;  Surgeon: Grace Isaac, MD;  Location: [facility];  Service: Thoracic;  Laterality: N/A;  . LUNG BIOPSY N/A 02/20/2019   Procedure: LUNG BIOPSY;  Surgeon: Grace Isaac, MD;  Location: [facility];  Service: Thoracic;  Laterality: N/A;  . TONSILLECTOMY    . VIDEO BRONCHOSCOPY WITH ENDOBRONCHIAL ULTRASOUND N/A 10/19/2016   Procedure: VIDEO BRONCHOSCOPY WITH ENDOBRONCHIAL ULTRASOUND;  Surgeon: Grace Isaac, MD;  Location: [facility];  Service: Thoracic;  Laterality: N/A;  . VIDEO BRONCHOSCOPY WITH ENDOBRONCHIAL ULTRASOUND N/A 02/20/2019   Procedure: VIDEO BRONCHOSCOPY WITH ENDOBRONCHIAL ULTRASOUND;  Surgeon: Grace Isaac, MD;  Location: [facility];  Service: Thoracic;  Laterality: N/A;    REVIEW OF SYSTEMS:  A comprehensive review of systems was negative except for: Constitutional: positive for fatigue Respiratory: positive for dyspnea on exertion   PHYSICAL EXAMINATION: General appearance: alert, cooperative, fatigued and no distress Head: Normocephalic, without obvious abnormality, atraumatic Neck: no adenopathy, no JVD, supple, symmetrical, trachea midline and thyroid not enlarged, symmetric, no tenderness/mass/nodules Lymph nodes: Cervical, supraclavicular, and axillary nodes normal. Resp:  clear to auscultation bilaterally Back: symmetric, no curvature. ROM normal. No CVA tenderness. Cardio: regular rate and rhythm, S1, S2 normal, no murmur, click, rub or gallop GI: soft, non-tender; bowel sounds normal; no masses,  no organomegaly Extremities: extremities normal, atraumatic, no cyanosis or edema  ECOG PERFORMANCE STATUS: 1 - Symptomatic but completely ambulatory  Blood pressure 122/85, pulse 83, temperature (!) 97.3 F (36.3 C), temperature source Tympanic, resp. rate (!) 21, height 5\' 6"  (1.676 m), weight 216 lb 12.8 oz (98.3 kg), SpO2 97 %.  LABORATORY DATA: Lab Results  Component Value Date   WBC 5.2 12/17/2020   HGB 12.9 12/17/2020   HCT 38.6 12/17/2020   MCV 91.9 12/17/2020   PLT 259 12/17/2020      Chemistry      Component Value Date/Time   NA 141 11/25/2020 1103   NA 139 11/24/2017 0913   K 3.7 11/25/2020 1103   K 3.8  11/24/2017 0913   CL 106 11/25/2020 1103   CO2 23 11/25/2020 1103   CO2 24 11/24/2017 0913   BUN 24 (H) 11/25/2020 1103   BUN 22.1 11/24/2017 0913   CREATININE 1.21 (H) 11/25/2020 1103   CREATININE 1.2 (H) 11/24/2017 0913      Component Value Date/Time   CALCIUM 9.1 11/25/2020 1103   CALCIUM 9.7 11/24/2017 0913   ALKPHOS 110 11/25/2020 1103   ALKPHOS 99 11/24/2017 0913   AST 15 11/25/2020 1103   AST 15 11/24/2017 0913   ALT 8 11/25/2020 1103   ALT 7 11/24/2017 0913   BILITOT 0.4 11/25/2020 1103   BILITOT 0.37 11/24/2017 0913       RADIOGRAPHIC STUDIES: No results found.  ASSESSMENT AND PLAN:  This is a very pleasant 69 years old white female with recurrent small cell lung cancer that was initially diagnosed as limited stage disease status post a course of systemic chemotherapy with cisplatin and etoposide for 6 cycles concurrent with radiation. She tolerated her treatment well except for fatigue. She is also status post prophylactic cranial irradiation. The patient has been in observation since March 2018. She was found on  recent imaging studies to have evidence for disease recurrence with right lower lobe pulmonary nodules in addition to mediastinal lymphadenopathy. Repeat bronchoscopy with endobronchial ultrasound and biopsy confirmed recurrence of his small cell lung cancer. The patient was started on systemic chemotherapy with carboplatin, etoposide and Tecentriq status post 22 cycles.  She has partial response after cycle #4 and starting from cycle #5 the patient is currently on maintenance treatment with Tecentriq.  The patient has been tolerating her treatment fairly well with no concerning complaints except for fatigue. I recommended for her to proceed with cycle #23 today as planned. For the hypothyroidism, she will continue her current treatment with levothyroxine and will monitor her TSH closely and adjust her dose as needed. She will come back for follow-up visit in 3 weeks for evaluation before the next cycle of her treatment.  The patient has been debating whether to stop her treatment or not but she agreed to continue for now. She was advised to call immediately if she has any concerning symptoms in the interval. The patient voices understanding of current disease status and treatment options and is in agreement with the current care plan.  All questions were answered. The patient knows to call the clinic with any problems, questions or concerns. We can certainly see the patient much sooner if necessary.  Disclaimer: This note was dictated with voice recognition software. Similar sounding words can inadvertently be transcribed and may not be corrected upon review.

## 2020-12-30 NOTE — Progress Notes (Signed)
  Radiation Oncology         [phone] ________________________________  Name: Tammy Bowers MRN: [id number]  Date: 08/31/2020  DOB: February 27, 1951  End of Treatment Note  Diagnosis:    70 yo woman with small cell carcinoma of the right lower lung, locally recurrent, following previous definitive chemoradiotherapy     Indication for treatment:  Palliation       Radiation treatment dates:   08/17/20-08/31/20  Site/dose:   The right lower lung received 25 Gy in 10 fractions  Beams/energy:   3D conformal radiation was given using static 10 and 190 degree gantries with 10X  Narrative: The patient tolerated radiation treatment relatively well.     Plan: The patient has completed radiation treatment. The patient will return to radiation oncology clinic for routine followup in one month. I advised her to call or return sooner if she has any questions or concerns related to her recovery or treatment. ________________________________  Sheral Apley. Tammi Klippel, M.D.

## 2021-01-07 ENCOUNTER — Telehealth: Payer: Self-pay | Admitting: Medical Oncology

## 2021-01-07 ENCOUNTER — Inpatient Hospital Stay: Payer: Medicare Other

## 2021-01-07 ENCOUNTER — Inpatient Hospital Stay: Payer: Medicare Other | Admitting: Physician Assistant

## 2021-01-07 NOTE — Telephone Encounter (Signed)
Pt not feeling well with nausea and vomiting . Husband being tested for COVID today . I told her to call us back with his test and if he is positive she needs to get tested too. I asked her to call back after his results.

## 2021-01-08 ENCOUNTER — Telehealth: Payer: Self-pay | Admitting: Physician Assistant

## 2021-01-08 ENCOUNTER — Telehealth: Payer: Self-pay

## 2021-01-08 ENCOUNTER — Telehealth: Payer: Self-pay | Admitting: Internal Medicine

## 2021-01-08 DIAGNOSIS — U071 COVID-19: Secondary | ICD-10-CM | POA: Diagnosis not present

## 2021-01-08 DIAGNOSIS — J069 Acute upper respiratory infection, unspecified: Secondary | ICD-10-CM | POA: Diagnosis not present

## 2021-01-08 DIAGNOSIS — J449 Chronic obstructive pulmonary disease, unspecified: Secondary | ICD-10-CM | POA: Diagnosis not present

## 2021-01-08 NOTE — Telephone Encounter (Signed)
R/s apt per 1/20 sch msg - called pt , no answer. Left message with new appt date and time

## 2021-01-08 NOTE — Telephone Encounter (Signed)
R/s appt per 1/21 sch msg - pt is aware of new appt date and time

## 2021-01-08 NOTE — Telephone Encounter (Signed)
Pt called to share that her husband tested positve for COVID and she is not feeling well herself so she is going to get tested as well but anticipates she will also be positive.  Schedule message has been sent to move her appts out 21 days out. Pt expressed understanding of this information.

## 2021-01-08 NOTE — Progress Notes (Deleted)
[facility] OFFICE PROGRESS NOTE  Koirala, Dibas, MD [facility] [place] [address] [address]  DIAGNOSIS: Recurrent small cell lung cancer initially diagnosed as Limited stage (T2b, N2, M0) small cell lung cancer diagnosed in November 2017 and presented with large right hilar mass with mediastinal invasion and a right lower lobe pulmonary nodule.  The patient had disease recurrence in the right lower lobe and mediastinal lymph nodes in February 2020.  PRIOR THERAPY: 1) Systemic chemotherapy with cisplatin 60 MG/M2 on day 1 and etoposide 120 MG/M2 on days 1, 2 and 3 concurrent with radiation status post 6 cycles.  Last dose was giving March 06, 2017. 2) status post prophylactic cranial irradiation. 3) palliative radiotherapy to the left hilar obstructive soft tissue mass under the care of Dr. Tammi Klippel.  CURRENT THERAPY: Systemic chemotherapy with carboplatin for AUC of 5 on day 1, etoposide.  100 mg/M2 on days 1, 2 and 3 as well as Tecentriq 1200 mg IV with Neulasta support every 3 weeks.  First dose March 05, 2019.  Status post 23 cycles.  Starting from cycle 5, the patient will be treated with single agent Tecentriq 1200 mg IV every 3 weeks.  First dose May 28, 2019.  The patient also received few cycles of immunotherapy in [place].   INTERVAL HISTORY: Tammy Bowers 70 y.o. female returns returns to the clinic today for a follow up visit. The patient is feeling _ today without any concerning complaints except for fatigue. In the interval since her last appointment, she saw her neurologist for her concerns regarding her events with transient numbness, weakness, and aphasia. She was scheduled to have an MRI which was _. Her neurologist felt this could represent seizures and perfromed an emiric trial on topamax. She is scheduled to follow up with them in 3-4 months.   She called reporting nausea, vomiting, and diarrhea? Also husband tested positive for COVID?  Otherwise,  regarding her treatment with immunotherapy with Tecentriq, she is tolerating this well. Today, she denies any fever, chills, night sweats, or weight loss.  She denies any hemoptysis.  She reports her baseline dyspnea on exertion and cough and she is wondering what she can do to help her lung function. If her cough gets "bad" she will use over-the-counter cough medications.  The patient continues to smoke cigarettes and is not interested in quitting.  She denies any hemoptysis.  She continues to report a stable occasional band like pain under her right breast. She denies any vomiting, nausea, vomiting, or diarrhea. The patient denies any rashes or skin changes.  The patient is here today for evaluation before starting cycle #24 of her treatment.    MEDICAL HISTORY: Past Medical History:  Diagnosis Date  . Adenopathy 10/10/2016   PERICARINAL  . Anemia   . Arthritis   . Asthma   . Cancer ([place])    lung cancer Small Cell  . COPD (chronic obstructive pulmonary disease) ([facility])   . Dyspnea   . Headache    migraines  . Hilar mass 10/10/2016   RIGHT  . History of kidney stones   . Insomnia   . Lung nodules 10/10/2016   RIGHT LOWER LOBE  . Mass of both adrenal glands ([facility]) 10/10/2016  . Mood swing   . Pneumonia   . Spinal headache   . Substance abuse ([place])    TOBACCO  . UNSPECIFIED INFECTION OF BONE ANKLE AND FOOT 10/22/2009   Annotation: left ankle Qualifier: Diagnosis of  By: Patsy Baltimore RN, Denise      ALLERGIES:  is allergic to lidocaine.  MEDICATIONS:  Current Outpatient Medications  Medication Sig Dispense Refill  . albuterol (PROVENTIL HFA;VENTOLIN HFA) 108 (90 BASE) MCG/ACT inhaler Inhale 2 puffs into the lungs every 4 (four) hours as needed for wheezing.    . clonazePAM (KLONOPIN) 0.5 MG tablet     . Cyclobenzaprine HCl (FLEXERIL PO) Take 5 mg by mouth 3 (three) times daily as needed (muscle spasms).     . diphenoxylate-atropine (LOMOTIL) 2.5-0.025 MG tablet Take 1 tablet by mouth  4 (four) times daily as needed for diarrhea or loose stools. 30 tablet 0  . Fluticasone-Salmeterol (ADVAIR) 500-50 MCG/DOSE AEPB Inhale 1 puff into the lungs 2 (two) times daily.    Marland Kitchen HYDROcodone-acetaminophen (HYCET) 7.5-325 mg/15 ml solution Take 10 mLs by mouth 4 (four) times daily as needed for moderate pain (use before meals and at bedtime prn). 473 mL 0  . ibuprofen (ADVIL) 800 MG tablet Take 800 mg by mouth every 8 (eight) hours as needed.    Marland Kitchen levothyroxine (SYNTHROID) 100 MCG tablet TAKE 1 TABLET(100 MCG) BY MOUTH DAILY 90 tablet 1  . Multiple Vitamin (MULTIVITAMIN WITH MINERALS) TABS tablet Take 1 tablet by mouth daily.     Marland Kitchen OVER THE COUNTER MEDICATION 1 capsule. [place] tail-"mushroom in a capsule"     . prochlorperazine (COMPAZINE) 10 MG tablet TAKE 1 TABLET(10 MG) BY MOUTH EVERY 6 HOURS AS NEEDED FOR NAUSEA OR VOMITING 30 tablet 0  . sucralfate (CARAFATE) 1 g tablet Take 1 tablet (1 g total) by mouth 4 (four) times daily -  with meals and at bedtime. Dissolve tablet in 4oz of water and drink (Patient not taking: Reported on 12/17/2020) 60 tablet 0  . topiramate (TOPAMAX) 25 MG tablet One tablet twice a day for 2 weeks, then take 2 tablets twice a day 120 tablet 3   No current facility-administered medications for this visit.    SURGICAL HISTORY:  Past Surgical History:  Procedure Laterality Date  . ANKLE SURGERY     hardware removal, and initial ankle surgery  . APPENDECTOMY    . IR IMAGING GUIDED PORT INSERTION  04/12/2019  . LUNG BIOPSY N/A 10/19/2016   Procedure: LUNG BIOPSY;  Surgeon: Grace Isaac, MD;  Location: [facility];  Service: Thoracic;  Laterality: N/A;  . LUNG BIOPSY N/A 02/20/2019   Procedure: LUNG BIOPSY;  Surgeon: Grace Isaac, MD;  Location: [facility];  Service: Thoracic;  Laterality: N/A;  . TONSILLECTOMY    . VIDEO BRONCHOSCOPY WITH ENDOBRONCHIAL ULTRASOUND N/A 10/19/2016   Procedure: VIDEO BRONCHOSCOPY WITH ENDOBRONCHIAL ULTRASOUND;  Surgeon: Grace Isaac, MD;  Location: [facility];  Service: Thoracic;  Laterality: N/A;  . VIDEO BRONCHOSCOPY WITH ENDOBRONCHIAL ULTRASOUND N/A 02/20/2019   Procedure: VIDEO BRONCHOSCOPY WITH ENDOBRONCHIAL ULTRASOUND;  Surgeon: Grace Isaac, MD;  Location: [facility];  Service: Thoracic;  Laterality: N/A;    REVIEW OF SYSTEMS:   Review of Systems  Constitutional: Negative for appetite change, chills, fatigue, fever and unexpected weight change.  HENT:   Negative for mouth sores, nosebleeds, sore throat and trouble swallowing.   Eyes: Negative for eye problems and icterus.  Respiratory: Negative for cough, hemoptysis, shortness of breath and wheezing.   Cardiovascular: Negative for chest pain and leg swelling.  Gastrointestinal: Negative for abdominal pain, constipation, diarrhea, nausea and vomiting.  Genitourinary: Negative for bladder incontinence, difficulty urinating, dysuria, frequency and hematuria.   Musculoskeletal: Negative for back  pain, gait problem, neck pain and neck stiffness.  Skin: Negative for itching and rash.  Neurological: Negative for dizziness, extremity weakness, gait problem, headaches, light-headedness and seizures.  Hematological: Negative for adenopathy. Does not bruise/bleed easily.  Psychiatric/Behavioral: Negative for confusion, depression and sleep disturbance. The patient is not nervous/anxious.     PHYSICAL EXAMINATION:  There were no vitals taken for this visit.  ECOG PERFORMANCE STATUS: {CHL ONC ECOG [id number]  Physical Exam  Constitutional: Oriented to person, place, and time and well-developed, well-nourished, and in no distress. No distress.  HENT:  Head: Normocephalic and atraumatic.  Mouth/Throat: Oropharynx is clear and moist. No oropharyngeal exudate.  Eyes: Conjunctivae are normal. Right eye exhibits no discharge. Left eye exhibits no discharge. No scleral icterus.  Neck: Normal range of motion. Neck supple.  Cardiovascular: Normal rate, regular rhythm,  normal heart sounds and intact distal pulses.   Pulmonary/Chest: Effort normal and breath sounds normal. No respiratory distress. No wheezes. No rales.  Abdominal: Soft. Bowel sounds are normal. Exhibits no distension and no mass. There is no tenderness.  Musculoskeletal: Normal range of motion. Exhibits no edema.  Lymphadenopathy:    No cervical adenopathy.  Neurological: Alert and oriented to person, place, and time. Exhibits normal muscle tone. Gait normal. Coordination normal.  Skin: Skin is warm and dry. No rash noted. Not diaphoretic. No erythema. No pallor.  Psychiatric: Mood, memory and judgment normal.  Vitals reviewed.  LABORATORY DATA: Lab Results  Component Value Date   WBC 5.2 12/17/2020   HGB 12.9 12/17/2020   HCT 38.6 12/17/2020   MCV 91.9 12/17/2020   PLT 259 12/17/2020      Chemistry      Component Value Date/Time   NA 141 12/17/2020 1127   NA 139 11/24/2017 0913   K 3.9 12/17/2020 1127   K 3.8 11/24/2017 0913   CL 112 (H) 12/17/2020 1127   CO2 25 12/17/2020 1127   CO2 24 11/24/2017 0913   BUN 27 (H) 12/17/2020 1127   BUN 22.1 11/24/2017 0913   CREATININE 1.18 (H) 12/17/2020 1127   CREATININE 1.2 (H) 11/24/2017 0913      Component Value Date/Time   CALCIUM 9.1 12/17/2020 1127   CALCIUM 9.7 11/24/2017 0913   ALKPHOS 94 12/17/2020 1127   ALKPHOS 99 11/24/2017 0913   AST 13 (L) 12/17/2020 1127   AST 15 11/24/2017 0913   ALT 10 12/17/2020 1127   ALT 7 11/24/2017 0913   BILITOT 0.4 12/17/2020 1127   BILITOT 0.37 11/24/2017 0913       RADIOGRAPHIC STUDIES:  No results found.   ASSESSMENT/PLAN:  This is a very pleasant 70 year old Caucasian female with recurrent small cell lung cancer.  She was initially diagnosed as limited stage (T2b, N2, M0) in 2017.  She presented with a large right hilar mass with mediastinal invasion and a right lower lobe pulmonary nodule.  She is status post a course of systemic chemotherapy with cisplatin and etoposide for 6  cycles with concurrent radiation.  She tolerated treatment well except for fatigue.   She also received prophylactic cranial irradiation.  She had been on observation since March 2018.   Recent imaging studies from February 2020 showed evidence of disease recurrence within right lower lobe pulmonary nodules in addition to mediastinal lymphadenopathy.  A repeat bronchoscopy with endobronchial ultrasound and biopsy confirmed recurrence of small cell lung cancer.   She is currently undergoing systemic chemotherapy with carboplatin, etoposide, and Tecentriq. She is status post 23 cycles. Starting  from cycle #5 the patient has been receiving single agent maintenance Tecentriq.  She has received some of her treatment in [place].   Labs were reviewed.  Recommend that she proceed cycle #24 today scheduled.  I will arrange for a restaging CT scan of the chest, abdomen, and pelvis prior to starting her next cycle of treatment.    We will see her back for follow-up visit in 3 weeks for evaluation before starting cycle #25.  We gave incentive spirometer last time. And told smoking cessation.    No orders of the defined types were placed in this encounter.    I spent {CHL ONC TIME VISIT - YIFOY:[id number]} counseling the patient face to face. The total time spent in the appointment was {CHL ONC TIME VISIT - EHMCN:[id number]}.  Analiza Cowger L Preslie Depasquale, PA-C 01/08/21

## 2021-01-11 ENCOUNTER — Other Ambulatory Visit: Payer: Self-pay | Admitting: Physician Assistant

## 2021-01-11 ENCOUNTER — Telehealth: Payer: Self-pay | Admitting: Physician Assistant

## 2021-01-11 DIAGNOSIS — C3491 Malignant neoplasm of unspecified part of right bronchus or lung: Secondary | ICD-10-CM

## 2021-01-11 DIAGNOSIS — G459 Transient cerebral ischemic attack, unspecified: Secondary | ICD-10-CM

## 2021-01-11 DIAGNOSIS — J449 Chronic obstructive pulmonary disease, unspecified: Secondary | ICD-10-CM

## 2021-01-11 DIAGNOSIS — U071 COVID-19: Secondary | ICD-10-CM

## 2021-01-11 NOTE — Telephone Encounter (Signed)
Called to discuss with patient about COVID-19 symptoms and the use of one of the available treatments for those with mild to moderate Covid symptoms and at a high risk of hospitalization.  Pt appears to qualify for outpatient treatment due to co-morbid conditions and/or a member of an at-risk group in accordance with the FDA Emergency Use Authorization.    Symptom onset: unknown  Vaccinated: unknown Booster? unknown Immunocompromised? yes  Unable to reach pt. Left voice mail.   Tammy Bowers - PAC

## 2021-01-11 NOTE — Progress Notes (Signed)
I connected by phone with Tammy Bowers on 01/11/2021 at 2:31 PM to discuss the potential use of a new treatment for mild to moderate COVID-19 viral infection in non-hospitalized patients.  This patient is a 70 y.o. female that meets the FDA criteria for Emergency Use Authorization of COVID monoclonal antibody sotrovimab.  Has a (+) direct SARS-CoV-2 viral test result  Has mild or moderate COVID-19   Is NOT hospitalized due to COVID-19  Is within 10 days of symptom onset  Has at least one of the high risk factor(s) for progression to severe COVID-19 and/or hospitalization as defined in EUA.  Specific high risk criteria : Older age (>/= 68 yo), BMI > 25, Immunosuppressive Disease or Treatment, Cardiovascular disease or hypertension and Chronic Lung Disease   I have spoken and communicated the following to the patient or parent/caregiver regarding COVID monoclonal antibody treatment:  1. FDA has authorized the emergency use for the treatment of mild to moderate COVID-19 in adults and pediatric patients with positive results of direct SARS-CoV-2 viral testing who are 43 years of age and older weighing at least 40 kg, and who are at high risk for progressing to severe COVID-19 and/or hospitalization.  2. The significant known and potential risks and benefits of COVID monoclonal antibody, and the extent to which such potential risks and benefits are unknown.  3. Information on available alternative treatments and the risks and benefits of those alternatives, including clinical trials.  4. Patients treated with COVID monoclonal antibody should continue to self-isolate and use infection control measures (e.g., wear mask, isolate, social distance, avoid sharing personal items, clean and disinfect "high touch" surfaces, and frequent handwashing) according to CDC guidelines.   5. The patient or parent/caregiver has the option to accept or refuse COVID monoclonal antibody treatment.  After reviewing  this information with the patient, the patient has agreed to receive one of the available covid 19 monoclonal antibodies and will be provided an appropriate fact sheet prior to infusion.   Sx onset 1/21. Fully vaccinated but immunosuppressed. Set up for infusion tomorrow 1/25 @ 10:30am.   Angelena Form, PA-C 01/11/2021 2:31 PM

## 2021-01-12 ENCOUNTER — Ambulatory Visit (HOSPITAL_COMMUNITY)
Admission: RE | Admit: 2021-01-12 | Discharge: 2021-01-12 | Disposition: A | Discharge status: Home / Self Care | Payer: Medicare Other | Source: Ambulatory Visit | Attending: Pulmonary Disease | Admitting: Pulmonary Disease

## 2021-01-12 DIAGNOSIS — J449 Chronic obstructive pulmonary disease, unspecified: Secondary | ICD-10-CM | POA: Diagnosis not present

## 2021-01-12 DIAGNOSIS — U071 COVID-19: Secondary | ICD-10-CM | POA: Insufficient documentation

## 2021-01-12 DIAGNOSIS — C3491 Malignant neoplasm of unspecified part of right bronchus or lung: Secondary | ICD-10-CM | POA: Insufficient documentation

## 2021-01-12 DIAGNOSIS — G459 Transient cerebral ischemic attack, unspecified: Secondary | ICD-10-CM | POA: Diagnosis not present

## 2021-01-12 MED ORDER — ALBUTEROL SULFATE HFA 108 (90 BASE) MCG/ACT IN AERS: 2.0000 | INHALATION_SPRAY | Freq: Once | RESPIRATORY_TRACT | Status: DC | PRN

## 2021-01-12 MED ORDER — METHYLPREDNISOLONE SODIUM SUCC 125 MG IJ SOLR: 125.0000 mg | Freq: Once | INTRAMUSCULAR | Status: DC | PRN

## 2021-01-12 MED ORDER — EPINEPHRINE 0.3 MG/0.3ML IJ SOAJ: 0.3000 mg | Freq: Once | INTRAMUSCULAR | Status: DC | PRN

## 2021-01-12 MED ORDER — FAMOTIDINE IN NACL 20-0.9 MG/50ML-% IV SOLN: 20.0000 mg | Freq: Once | INTRAVENOUS | Status: DC | PRN

## 2021-01-12 MED ORDER — SOTROVIMAB 500 MG/8ML IV SOLN
500.0000 mg | Freq: Once | INTRAVENOUS | Status: AC
  Administered 2021-01-12: 500 mg via INTRAVENOUS

## 2021-01-12 MED ORDER — SODIUM CHLORIDE 0.9 % IV SOLN: INTRAVENOUS | Status: DC | PRN

## 2021-01-12 MED ORDER — HEPARIN SOD (PORK) LOCK FLUSH 100 UNIT/ML IV SOLN
500.0000 [IU] | Freq: Once | INTRAVENOUS | Status: AC
  Administered 2021-01-12: 500 [IU] via INTRAVENOUS

## 2021-01-12 MED ORDER — DIPHENHYDRAMINE HCL 50 MG/ML IJ SOLN: 50.0000 mg | Freq: Once | INTRAMUSCULAR | Status: DC | PRN

## 2021-01-12 NOTE — Progress Notes (Signed)
Diagnosis: COVID-19  Physician: Dr. Patrick Wright  Procedure: [facility] Infusion Clinic Med: Sotrovimab infusion - Provided patient with sotrovimab fact sheet for patients, parents, and caregivers prior to infusion.   Complications: No immediate complications noted  Discharge: Discharged home    

## 2021-01-12 NOTE — Discharge Instructions (Signed)

## 2021-01-12 NOTE — Progress Notes (Signed)
Patient reviewed Fact Sheet for Patients, Parents, and Caregivers for Emergency Use Authorization (EUA) of sotrovimab for the Treatment of Coronavirus. Patient also reviewed and is agreeable to the estimated cost of treatment. Patient is agreeable to proceed.   

## 2021-01-13 ENCOUNTER — Inpatient Hospital Stay: Payer: Medicare Other | Admitting: Physician Assistant

## 2021-01-13 ENCOUNTER — Inpatient Hospital Stay: Payer: Medicare Other

## 2021-01-28 ENCOUNTER — Other Ambulatory Visit: Payer: Medicaid Other

## 2021-01-28 ENCOUNTER — Telehealth: Payer: Self-pay | Admitting: Medical Oncology

## 2021-01-28 ENCOUNTER — Ambulatory Visit: Payer: Medicaid Other | Admitting: Physician Assistant

## 2021-01-28 ENCOUNTER — Ambulatory Visit: Payer: Medicaid Other

## 2021-01-28 NOTE — Telephone Encounter (Signed)
Should she keep appt feb 14th? Jan 24th  COVID +  Jan 26-received monoclonal infusion.  Per Julien Nordmann I instructed her to keep appt.2/14

## 2021-01-28 NOTE — Progress Notes (Signed)
[facility] OFFICE PROGRESS NOTE  Koirala, Dibas, MD [address] [place] [address]  DIAGNOSIS: Recurrent small cell lung cancer initially diagnosed as Limited stage (T2b, N2, M0) small cell lung cancer diagnosed in November 2017 and presented with large right hilar mass with mediastinal invasion and a right lower lobe pulmonary nodule. The patient had disease recurrence in the right lower lobe and mediastinal lymph nodes in February 2020.  PRIOR THERAPY:  1) Systemic chemotherapy with cisplatin 60 MG/M2 on day 1 and etoposide 120 MG/M2 on days 1, 2 and 3 concurrent with radiation status post 6 cycles. Last dose was giving March 06, 2017. 2) status post prophylactic cranial irradiation. 3) palliative radiotherapy to the left hilar obstructive soft tissue mass under the care of Dr. Tammi Klippel.  CURRENT THERAPY: Systemic chemotherapy with carboplatin for AUC of 5 on day 1, etoposide. 100 mg/M2 on days 1, 2 and 3 as well as Tecentriq 1200 mg IV with Neulasta support every 3 weeks. First dose March 05, 2019. Status post 23cycles. Starting from cycle 5, the patient will be treated with single agent Tecentriq 1200 mg IV every 3 weeks. First dose May 28, 2019. The patient also received few cycles of immunotherapy in [place].  INTERVAL HISTORY: Tammy Bowers 70 y.o. female returns to the clinic today for a follow up visit. In the interval since her last appointment, the patient tested positive for COVID-19. She received treatment at the COVID-19 infusion clinic. The patient is feeling better today.   In the interval since her last appointment, she saw her neurologist for her concerns regarding her events with transient numbness, weakness, and aphasia. Her neurologist felt this could represent seizures or migraine equivalent and started her on an emiric trial on topamax She was scheduled to have an MRI which was cancelled after the patient had improvement in her  symptoms with topamax. She is scheduled to follow up with them in 3-4 months.   Otherwise, regarding her treatment with immunotherapy with Tecentriq, she is tolerating this well. Today, she denies any fever, chills, night sweats, or weight loss.  She denies any hemoptysis.  She reports her baseline dyspnea on exertion and cough.  The patient continues to smoke cigarettes and is not interested in quitting.  She denies any hemoptysis.  She denies any vomiting, vomiting, constipation, or diarrhea but occasionally has nausea. The patient denies any rashes or skin changes.  The patient is here today for evaluation before starting cycle #24 of her treatment.  MEDICAL HISTORY: Past Medical History:  Diagnosis Date  . Adenopathy 10/10/2016   PERICARINAL  . Anemia   . Arthritis   . Asthma   . Cancer ([place])    lung cancer Small Cell  . COPD (chronic obstructive pulmonary disease) ([facility])   . Dyspnea   . Headache    migraines  . Hilar mass 10/10/2016   RIGHT  . History of kidney stones   . Insomnia   . Lung nodules 10/10/2016   RIGHT LOWER LOBE  . Mass of both adrenal glands ([place]) 10/10/2016  . Mood swing   . Pneumonia   . Spinal headache   . Substance abuse ([place])    TOBACCO  . UNSPECIFIED INFECTION OF BONE ANKLE AND FOOT 10/22/2009   Annotation: left ankle Qualifier: Diagnosis of  By: Patsy Baltimore RN, Denise      ALLERGIES:  is allergic to lidocaine.  MEDICATIONS:  Current Outpatient Medications  Medication Sig Dispense Refill  . albuterol (  PROVENTIL HFA;VENTOLIN HFA) 108 (90 BASE) MCG/ACT inhaler Inhale 2 puffs into the lungs every 4 (four) hours as needed for wheezing.    . clonazePAM (KLONOPIN) 0.5 MG tablet     . Cyclobenzaprine HCl (FLEXERIL PO) Take 5 mg by mouth 3 (three) times daily as needed (muscle spasms).     . diphenoxylate-atropine (LOMOTIL) 2.5-0.025 MG tablet Take 1 tablet by mouth 4 (four) times daily as needed for diarrhea or loose stools. 30 tablet 0  .  Fluticasone-Salmeterol (ADVAIR) 500-50 MCG/DOSE AEPB Inhale 1 puff into the lungs 2 (two) times daily.    Marland Kitchen HYDROcodone-acetaminophen (HYCET) 7.5-325 mg/15 ml solution Take 10 mLs by mouth 4 (four) times daily as needed for moderate pain (use before meals and at bedtime prn). 473 mL 0  . ibuprofen (ADVIL) 800 MG tablet Take 800 mg by mouth every 8 (eight) hours as needed.    Marland Kitchen levothyroxine (SYNTHROID) 100 MCG tablet TAKE 1 TABLET(100 MCG) BY MOUTH DAILY 90 tablet 1  . Multiple Vitamin (MULTIVITAMIN WITH MINERALS) TABS tablet Take 1 tablet by mouth daily.     Marland Kitchen OVER THE COUNTER MEDICATION 1 capsule. [place] tail-"mushroom in a capsule"     . prochlorperazine (COMPAZINE) 10 MG tablet TAKE 1 TABLET(10 MG) BY MOUTH EVERY 6 HOURS AS NEEDED FOR NAUSEA OR VOMITING 30 tablet 0  . sucralfate (CARAFATE) 1 g tablet Take 1 tablet (1 g total) by mouth 4 (four) times daily -  with meals and at bedtime. Dissolve tablet in 4oz of water and drink 60 tablet 0  . topiramate (TOPAMAX) 25 MG tablet One tablet twice a day for 2 weeks, then take 2 tablets twice a day 120 tablet 3   No current facility-administered medications for this visit.    SURGICAL HISTORY:  Past Surgical History:  Procedure Laterality Date  . ANKLE SURGERY     hardware removal, and initial ankle surgery  . APPENDECTOMY    . IR IMAGING GUIDED PORT INSERTION  04/12/2019  . LUNG BIOPSY N/A 10/19/2016   Procedure: LUNG BIOPSY;  Surgeon: Grace Isaac, MD;  Location: [facility];  Service: Thoracic;  Laterality: N/A;  . LUNG BIOPSY N/A 02/20/2019   Procedure: LUNG BIOPSY;  Surgeon: Grace Isaac, MD;  Location: [facility];  Service: Thoracic;  Laterality: N/A;  . TONSILLECTOMY    . VIDEO BRONCHOSCOPY WITH ENDOBRONCHIAL ULTRASOUND N/A 10/19/2016   Procedure: VIDEO BRONCHOSCOPY WITH ENDOBRONCHIAL ULTRASOUND;  Surgeon: Grace Isaac, MD;  Location: [facility];  Service: Thoracic;  Laterality: N/A;  . VIDEO BRONCHOSCOPY WITH ENDOBRONCHIAL ULTRASOUND N/A  02/20/2019   Procedure: VIDEO BRONCHOSCOPY WITH ENDOBRONCHIAL ULTRASOUND;  Surgeon: Grace Isaac, MD;  Location: [facility];  Service: Thoracic;  Laterality: N/A;    REVIEW OF SYSTEMS:   Review of Systems  Constitutional: Negative for appetite change, chills, fatigue, fever and unexpected weight change.  HENT: Negative for mouth sores, nosebleeds, sore throat and trouble swallowing.  Eyes: Negative for eye problems and icterus.  Respiratory:Positive for baseline cough and dyspnea on exertion.Negative for hemoptysis and wheezing.  Cardiovascular: Negative for chest pain and leg swelling.  Gastrointestinal: Positive for occasional nausea. Negative for abdominal pain, constipation, diarrhea, and vomiting.  Genitourinary: Negative for bladder incontinence, difficulty urinating, dysuria, frequency and hematuria.  Musculoskeletal: Negative for back pain, gait problem, neck pain and neck stiffness.  Skin: Negative for itching and rash.  Neurological: Positive for occasional headaches, speech disturbances, and left extremity numbness (improved since starting Topamax). Negative for dizziness, gait  problem, light-headedness and seizures.  Hematological: Negative for adenopathy. Does not bruise/bleed easily.  Psychiatric/Behavioral: Negative for confusion, depression and sleep disturbance. The patient is not nervous/anxious.      PHYSICAL EXAMINATION:  Blood pressure (!) 119/95, pulse (!) 108, temperature (!) 97.3 F (36.3 C), temperature source Tympanic, resp. rate 18, height 5\' 6"  (1.676 m), weight 219 lb 8 oz (99.6 kg).  ECOG PERFORMANCE STATUS: 1 - Symptomatic but completely ambulatory  Physical Exam  Constitutional: Oriented to person, place, and time and well-developed, well-nourished, and in no distress.  HENT:  Head: Normocephalic and atraumatic.  Mouth/Throat: Oropharynx is clear and moist. No oropharyngeal exudate.  Eyes: Conjunctivae are normal. Right eye exhibits no discharge.  Left eye exhibits no discharge. No scleral icterus.  Neck: Normal range of motion. Neck supple.  Cardiovascular: Normal rate, regular rhythm, normal heart sounds and intact distal pulses.   Pulmonary/Chest: Effort normal and breath sounds normal. No respiratory distress. No wheezes. No rales.  Abdominal: Soft. Bowel sounds are normal. Exhibits no distension and no mass. There is no tenderness.  Musculoskeletal: Normal range of motion. Exhibits no edema.  Lymphadenopathy:    No cervical adenopathy.  Neurological: Alert and oriented to person, place, and time. Exhibits normal muscle tone. Gait normal. Coordination normal. Ambulates with a cane.  Skin: Skin is warm and dry. No rash noted. Not diaphoretic. No erythema. No pallor.  Psychiatric: Mood, memory and judgment normal.  Vitals reviewed.  LABORATORY DATA: Lab Results  Component Value Date   WBC 6.0 02/01/2021   HGB 13.4 02/01/2021   HCT 39.0 02/01/2021   MCV 90.3 02/01/2021   PLT 252 02/01/2021      Chemistry      Component Value Date/Time   NA 138 02/01/2021 1110   NA 139 11/24/2017 0913   K 3.8 02/01/2021 1110   K 3.8 11/24/2017 0913   CL 111 02/01/2021 1110   CO2 21 (L) 02/01/2021 1110   CO2 24 11/24/2017 0913   BUN 25 (H) 02/01/2021 1110   BUN 22.1 11/24/2017 0913   CREATININE 1.26 (H) 02/01/2021 1110   CREATININE 1.2 (H) 11/24/2017 0913      Component Value Date/Time   CALCIUM 8.8 (L) 02/01/2021 1110   CALCIUM 9.7 11/24/2017 0913   ALKPHOS 110 02/01/2021 1110   ALKPHOS 99 11/24/2017 0913   AST 13 (L) 02/01/2021 1110   AST 15 11/24/2017 0913   ALT 7 02/01/2021 1110   ALT 7 11/24/2017 0913   BILITOT 0.2 (L) 02/01/2021 1110   BILITOT 0.37 11/24/2017 0913       RADIOGRAPHIC STUDIES:  No results found.   ASSESSMENT/PLAN:  This is a very pleasant 70 year old Caucasian female with recurrent small cell lung cancer.  She was initially diagnosed as limited stage (T2b, N2, M0) in 2017.  She presented with a  large right hilar mass with mediastinal invasion and a right lower lobe pulmonary nodule.  She is status post a course of systemic chemotherapy with cisplatin and etoposide for 6 cycles with concurrent radiation.  She tolerated treatment well except for fatigue.   She also received prophylactic cranial irradiation.  She had been on observation since March 2018.   Recent imaging studies from February 2020 showed evidence of disease recurrence within right lower lobe pulmonary nodules in addition to mediastinal lymphadenopathy.  A repeat bronchoscopy with endobronchial ultrasound and biopsy confirmed recurrence of small cell lung cancer.   She is currently undergoing systemic chemotherapy with carboplatin, etoposide, and Tecentriq. She  is status post 23 cycles. Starting from cycle #5 the patient has been receiving single agent maintenance Tecentriq.  She has received some of her treatment in [place].   Labs were reviewed.  Recommend that she proceed cycle #24 today scheduled.  I will arrange for a restaging CT scan of the chest, abdomen, and pelvis prior to starting her next cycle of treatment.    We will see her back for follow-up visit in 3 weeks for evaluation before starting cycle #25.  The patient was advised to call immediately if she has any concerning symptoms in the interval. The patient voices understanding of current disease status and treatment options and is in agreement with the current care plan. All questions were answered. The patient knows to call the clinic with any problems, questions or concerns. We can certainly see the patient much sooner if necessary Orders Placed This Encounter  Procedures  . CT Chest W Contrast    Standing Status:   Future    Standing Expiration Date:   02/01/2022    Order Specific Question:   If indicated for the ordered procedure, I authorize the administration of contrast media per Radiology protocol    Answer:   Yes    Order Specific Question:    Preferred imaging location?    Answer:   [facility]  . CT Abdomen Pelvis W Contrast    Standing Status:   Future    Standing Expiration Date:   02/01/2022    Order Specific Question:   If indicated for the ordered procedure, I authorize the administration of contrast media per Radiology protocol    Answer:   Yes    Order Specific Question:   Preferred imaging location?    Answer:   [facility]    Order Specific Question:   Is Oral Contrast requested for this exam?    Answer:   Yes, Per Radiology protocol  . CBC with Differential (Cancer Center Only)    Standing Status:   Standing    Number of Occurrences:   18    Standing Expiration Date:   02/01/2022  . CMP ([facility] only)    Standing Status:   Standing    Number of Occurrences:   18    Standing Expiration Date:   02/01/2022     I spent 20-29 minute in this encounter.   Darvin Dials L Rilynn Habel, PA-C 02/01/21

## 2021-02-01 ENCOUNTER — Other Ambulatory Visit: Payer: Self-pay

## 2021-02-01 ENCOUNTER — Other Ambulatory Visit: Payer: Self-pay | Admitting: Physician Assistant

## 2021-02-01 ENCOUNTER — Encounter: Payer: Self-pay | Admitting: Physician Assistant

## 2021-02-01 ENCOUNTER — Inpatient Hospital Stay (HOSPITAL_BASED_OUTPATIENT_CLINIC_OR_DEPARTMENT_OTHER): Payer: Medicare Other | Admitting: Physician Assistant

## 2021-02-01 ENCOUNTER — Inpatient Hospital Stay: Payer: Medicare Other

## 2021-02-01 ENCOUNTER — Inpatient Hospital Stay: Payer: Medicare Other | Attending: Internal Medicine

## 2021-02-01 VITALS — HR 90

## 2021-02-01 VITALS — BP 119/95 | HR 108 | Temp 97.3°F | Resp 18 | Ht 66.0 in | Wt 219.5 lb

## 2021-02-01 DIAGNOSIS — Z923 Personal history of irradiation: Secondary | ICD-10-CM | POA: Insufficient documentation

## 2021-02-01 DIAGNOSIS — F17210 Nicotine dependence, cigarettes, uncomplicated: Secondary | ICD-10-CM | POA: Diagnosis not present

## 2021-02-01 DIAGNOSIS — R5382 Chronic fatigue, unspecified: Secondary | ICD-10-CM

## 2021-02-01 DIAGNOSIS — C3431 Malignant neoplasm of lower lobe, right bronchus or lung: Secondary | ICD-10-CM | POA: Insufficient documentation

## 2021-02-01 DIAGNOSIS — Z5112 Encounter for antineoplastic immunotherapy: Secondary | ICD-10-CM | POA: Insufficient documentation

## 2021-02-01 DIAGNOSIS — J449 Chronic obstructive pulmonary disease, unspecified: Secondary | ICD-10-CM | POA: Diagnosis not present

## 2021-02-01 DIAGNOSIS — Z79899 Other long term (current) drug therapy: Secondary | ICD-10-CM | POA: Insufficient documentation

## 2021-02-01 DIAGNOSIS — C3491 Malignant neoplasm of unspecified part of right bronchus or lung: Secondary | ICD-10-CM

## 2021-02-01 DIAGNOSIS — Z9049 Acquired absence of other specified parts of digestive tract: Secondary | ICD-10-CM | POA: Insufficient documentation

## 2021-02-01 DIAGNOSIS — Z7951 Long term (current) use of inhaled steroids: Secondary | ICD-10-CM | POA: Insufficient documentation

## 2021-02-01 DIAGNOSIS — R5383 Other fatigue: Secondary | ICD-10-CM

## 2021-02-01 LAB — CBC WITH DIFFERENTIAL (CANCER CENTER ONLY)
Abs Immature Granulocytes: 0.03 10*3/uL (ref 0.00–0.07)
Basophils Absolute: 0.1 10*3/uL (ref 0.0–0.1)
Basophils Relative: 1 %
Eosinophils Absolute: 0.2 10*3/uL (ref 0.0–0.5)
Eosinophils Relative: 3 %
HCT: 39 % (ref 36.0–46.0)
Hemoglobin: 13.4 g/dL (ref 12.0–15.0)
Immature Granulocytes: 1 %
Lymphocytes Relative: 17 %
Lymphs Abs: 1 10*3/uL (ref 0.7–4.0)
MCH: 31 pg (ref 26.0–34.0)
MCHC: 34.4 g/dL (ref 30.0–36.0)
MCV: 90.3 fL (ref 80.0–100.0)
Monocytes Absolute: 0.5 10*3/uL (ref 0.1–1.0)
Monocytes Relative: 8 %
Neutro Abs: 4.2 10*3/uL (ref 1.7–7.7)
Neutrophils Relative %: 70 %
Platelet Count: 252 10*3/uL (ref 150–400)
RBC: 4.32 MIL/uL (ref 3.87–5.11)
RDW: 12.3 % (ref 11.5–15.5)
WBC Count: 6 10*3/uL (ref 4.0–10.5)
nRBC: 0 % (ref 0.0–0.2)

## 2021-02-01 LAB — CMP (CANCER CENTER ONLY)
ALT: 7 U/L (ref 0–44)
AST: 13 U/L — ABNORMAL LOW (ref 15–41)
Albumin: 3.4 g/dL — ABNORMAL LOW (ref 3.5–5.0)
Alkaline Phosphatase: 110 U/L (ref 38–126)
Anion gap: 6 (ref 5–15)
BUN: 25 mg/dL — ABNORMAL HIGH (ref 8–23)
CO2: 21 mmol/L — ABNORMAL LOW (ref 22–32)
Calcium: 8.8 mg/dL — ABNORMAL LOW (ref 8.9–10.3)
Chloride: 111 mmol/L (ref 98–111)
Creatinine: 1.26 mg/dL — ABNORMAL HIGH (ref 0.44–1.00)
GFR, Estimated: 47 mL/min — ABNORMAL LOW (ref >60)
Glucose, Bld: 76 mg/dL (ref 70–99)
Potassium: 3.8 mmol/L (ref 3.5–5.1)
Sodium: 138 mmol/L (ref 135–145)
Total Bilirubin: 0.2 mg/dL — ABNORMAL LOW (ref 0.3–1.2)
Total Protein: 6.9 g/dL (ref 6.5–8.1)

## 2021-02-01 LAB — TSH: TSH: 7.553 u[IU]/mL — ABNORMAL HIGH (ref 0.308–3.960)

## 2021-02-01 MED ORDER — HEPARIN SOD (PORK) LOCK FLUSH 100 UNIT/ML IV SOLN
500.0000 [IU] | Freq: Once | INTRAVENOUS | Status: AC | PRN
  Administered 2021-02-01: 500 [IU]
  Filled 2021-02-01: qty 5

## 2021-02-01 MED ORDER — SODIUM CHLORIDE 0.9% FLUSH
10.0000 mL | INTRAVENOUS | Status: DC | PRN
Start: 2021-02-01 — End: 2021-02-01
  Administered 2021-02-01: 10 mL
  Filled 2021-02-01: qty 10

## 2021-02-01 MED ORDER — SODIUM CHLORIDE 0.9 % IV SOLN
Freq: Once | INTRAVENOUS | Status: AC
  Filled 2021-02-01: qty 250

## 2021-02-01 MED ORDER — SODIUM CHLORIDE 0.9 % IV SOLN
1200.0000 mg | Freq: Once | INTRAVENOUS | Status: AC
  Administered 2021-02-01: 1200 mg via INTRAVENOUS
  Filled 2021-02-01: qty 20

## 2021-02-01 MED ORDER — LEVOTHYROXINE SODIUM 112 MCG PO TABS: 112.0000 ug | ORAL_TABLET | Freq: Every day | ORAL | 1 refills | Status: DC

## 2021-02-01 NOTE — Patient Instructions (Signed)
[facility] Discharge Instructions for Patients Receiving Chemotherapy  Today you received the following chemotherapy agents: tecentriq  To help prevent nausea and vomiting after your treatment, we encourage you to take your nausea medication as directed.    If you develop nausea and vomiting that is not controlled by your nausea medication, call the clinic.   BELOW ARE SYMPTOMS THAT SHOULD BE REPORTED IMMEDIATELY:  *FEVER GREATER THAN 100.5 F  *CHILLS WITH OR WITHOUT FEVER  NAUSEA AND VOMITING THAT IS NOT CONTROLLED WITH YOUR NAUSEA MEDICATION  *UNUSUAL SHORTNESS OF BREATH  *UNUSUAL BRUISING OR BLEEDING  TENDERNESS IN MOUTH AND THROAT WITH OR WITHOUT PRESENCE OF ULCERS  *URINARY PROBLEMS  *BOWEL PROBLEMS  UNUSUAL RASH Items with * indicate a potential emergency and should be followed up as soon as possible.  Feel free to call the clinic should you have any questions or concerns. The clinic phone number is [phone].  Please show the [facility] at check-in to the Emergency Department and triage nurse.

## 2021-02-02 ENCOUNTER — Telehealth: Payer: Self-pay | Admitting: Physician Assistant

## 2021-02-02 NOTE — Telephone Encounter (Signed)
Scheduled appointments per 2/14 los. Spoke to patient who is aware of appointments dates and times. Will have updated calendar printed for patient at next visit.

## 2021-02-16 DIAGNOSIS — Z832 Family history of diseases of the blood and blood-forming organs and certain disorders involving the immune mechanism: Secondary | ICD-10-CM | POA: Diagnosis not present

## 2021-02-16 DIAGNOSIS — E039 Hypothyroidism, unspecified: Secondary | ICD-10-CM | POA: Diagnosis not present

## 2021-02-16 DIAGNOSIS — Z8639 Personal history of other endocrine, nutritional and metabolic disease: Secondary | ICD-10-CM | POA: Diagnosis not present

## 2021-02-16 DIAGNOSIS — Z840 Family history of diseases of the skin and subcutaneous tissue: Secondary | ICD-10-CM | POA: Diagnosis not present

## 2021-02-18 ENCOUNTER — Ambulatory Visit: Payer: Medicaid Other | Admitting: Internal Medicine

## 2021-02-18 ENCOUNTER — Other Ambulatory Visit: Payer: Medicaid Other

## 2021-02-18 ENCOUNTER — Ambulatory Visit: Payer: Medicaid Other

## 2021-02-19 ENCOUNTER — Encounter (HOSPITAL_COMMUNITY): Payer: Self-pay

## 2021-02-19 ENCOUNTER — Other Ambulatory Visit: Payer: Self-pay

## 2021-02-19 ENCOUNTER — Ambulatory Visit (HOSPITAL_COMMUNITY)
Admission: RE | Admit: 2021-02-19 | Discharge: 2021-02-19 | Disposition: A | Discharge status: Home / Self Care | Payer: Medicare Other | Source: Ambulatory Visit | Attending: Physician Assistant | Admitting: Physician Assistant

## 2021-02-19 DIAGNOSIS — J8410 Pulmonary fibrosis, unspecified: Secondary | ICD-10-CM | POA: Diagnosis not present

## 2021-02-19 DIAGNOSIS — D3500 Benign neoplasm of unspecified adrenal gland: Secondary | ICD-10-CM | POA: Diagnosis not present

## 2021-02-19 DIAGNOSIS — N200 Calculus of kidney: Secondary | ICD-10-CM | POA: Diagnosis not present

## 2021-02-19 DIAGNOSIS — J929 Pleural plaque without asbestos: Secondary | ICD-10-CM | POA: Diagnosis not present

## 2021-02-19 DIAGNOSIS — N281 Cyst of kidney, acquired: Secondary | ICD-10-CM | POA: Diagnosis not present

## 2021-02-19 DIAGNOSIS — C3491 Malignant neoplasm of unspecified part of right bronchus or lung: Secondary | ICD-10-CM

## 2021-02-19 DIAGNOSIS — I898 Other specified noninfective disorders of lymphatic vessels and lymph nodes: Secondary | ICD-10-CM | POA: Diagnosis not present

## 2021-02-19 MED ORDER — HEPARIN SOD (PORK) LOCK FLUSH 100 UNIT/ML IV SOLN
500.0000 [IU] | Freq: Once | INTRAVENOUS | Status: AC
  Administered 2021-02-19: 500 [IU] via INTRAVENOUS

## 2021-02-19 MED ORDER — IOHEXOL 300 MG/ML  SOLN
100.0000 mL | Freq: Once | INTRAMUSCULAR | Status: AC | PRN
  Administered 2021-02-19: 100 mL via INTRAVENOUS

## 2021-02-19 MED ORDER — HEPARIN SOD (PORK) LOCK FLUSH 100 UNIT/ML IV SOLN
INTRAVENOUS | Status: AC
  Filled 2021-02-19: qty 5

## 2021-02-20 ENCOUNTER — Other Ambulatory Visit: Payer: Self-pay | Admitting: Neurology

## 2021-02-22 ENCOUNTER — Inpatient Hospital Stay: Payer: Medicare Other

## 2021-02-22 ENCOUNTER — Inpatient Hospital Stay (HOSPITAL_BASED_OUTPATIENT_CLINIC_OR_DEPARTMENT_OTHER): Payer: Medicare Other | Admitting: Internal Medicine

## 2021-02-22 ENCOUNTER — Encounter: Payer: Self-pay | Admitting: Internal Medicine

## 2021-02-22 ENCOUNTER — Other Ambulatory Visit: Payer: Self-pay

## 2021-02-22 ENCOUNTER — Inpatient Hospital Stay: Payer: Medicare Other | Attending: Internal Medicine

## 2021-02-22 VITALS — BP 113/87 | HR 108 | Temp 97.6°F | Resp 17 | Ht 66.0 in | Wt 217.9 lb

## 2021-02-22 VITALS — HR 86

## 2021-02-22 DIAGNOSIS — C3491 Malignant neoplasm of unspecified part of right bronchus or lung: Secondary | ICD-10-CM

## 2021-02-22 DIAGNOSIS — Z79899 Other long term (current) drug therapy: Secondary | ICD-10-CM | POA: Insufficient documentation

## 2021-02-22 DIAGNOSIS — Z5112 Encounter for antineoplastic immunotherapy: Secondary | ICD-10-CM | POA: Diagnosis not present

## 2021-02-22 DIAGNOSIS — E039 Hypothyroidism, unspecified: Secondary | ICD-10-CM | POA: Insufficient documentation

## 2021-02-22 DIAGNOSIS — Z7951 Long term (current) use of inhaled steroids: Secondary | ICD-10-CM | POA: Insufficient documentation

## 2021-02-22 DIAGNOSIS — Z95828 Presence of other vascular implants and grafts: Secondary | ICD-10-CM | POA: Diagnosis not present

## 2021-02-22 DIAGNOSIS — C3431 Malignant neoplasm of lower lobe, right bronchus or lung: Secondary | ICD-10-CM | POA: Diagnosis not present

## 2021-02-22 DIAGNOSIS — Z923 Personal history of irradiation: Secondary | ICD-10-CM | POA: Insufficient documentation

## 2021-02-22 DIAGNOSIS — R5383 Other fatigue: Secondary | ICD-10-CM

## 2021-02-22 LAB — CMP (CANCER CENTER ONLY)
ALT: 10 U/L (ref 0–44)
AST: 15 U/L (ref 15–41)
Albumin: 3.4 g/dL — ABNORMAL LOW (ref 3.5–5.0)
Alkaline Phosphatase: 119 U/L (ref 38–126)
Anion gap: 7 (ref 5–15)
BUN: 23 mg/dL (ref 8–23)
CO2: 23 mmol/L (ref 22–32)
Calcium: 9.1 mg/dL (ref 8.9–10.3)
Chloride: 109 mmol/L (ref 98–111)
Creatinine: 1.26 mg/dL — ABNORMAL HIGH (ref 0.44–1.00)
GFR, Estimated: 47 mL/min — ABNORMAL LOW (ref >60)
Glucose, Bld: 79 mg/dL (ref 70–99)
Potassium: 3.7 mmol/L (ref 3.5–5.1)
Sodium: 139 mmol/L (ref 135–145)
Total Bilirubin: 0.3 mg/dL (ref 0.3–1.2)
Total Protein: 7.1 g/dL (ref 6.5–8.1)

## 2021-02-22 LAB — CBC WITH DIFFERENTIAL (CANCER CENTER ONLY)
Abs Immature Granulocytes: 0 10*3/uL (ref 0.00–0.07)
Basophils Absolute: 0.1 10*3/uL (ref 0.0–0.1)
Basophils Relative: 1 %
Eosinophils Absolute: 0.2 10*3/uL (ref 0.0–0.5)
Eosinophils Relative: 3 %
HCT: 41 % (ref 36.0–46.0)
Hemoglobin: 13.5 g/dL (ref 12.0–15.0)
Immature Granulocytes: 0 %
Lymphocytes Relative: 11 %
Lymphs Abs: 0.6 10*3/uL — ABNORMAL LOW (ref 0.7–4.0)
MCH: 29.8 pg (ref 26.0–34.0)
MCHC: 32.9 g/dL (ref 30.0–36.0)
MCV: 90.5 fL (ref 80.0–100.0)
Monocytes Absolute: 0.5 10*3/uL (ref 0.1–1.0)
Monocytes Relative: 9 %
Neutro Abs: 4.5 10*3/uL (ref 1.7–7.7)
Neutrophils Relative %: 76 %
Platelet Count: 294 10*3/uL (ref 150–400)
RBC: 4.53 MIL/uL (ref 3.87–5.11)
RDW: 12.4 % (ref 11.5–15.5)
WBC Count: 5.9 10*3/uL (ref 4.0–10.5)

## 2021-02-22 LAB — TSH: TSH: 2.577 u[IU]/mL (ref 0.308–3.960)

## 2021-02-22 MED ORDER — SODIUM CHLORIDE 0.9 % IV SOLN
Freq: Once | INTRAVENOUS | Status: AC
  Filled 2021-02-22: qty 250

## 2021-02-22 MED ORDER — SODIUM CHLORIDE 0.9% FLUSH
10.0000 mL | INTRAVENOUS | Status: DC | PRN
  Administered 2021-02-22: 10 mL
  Filled 2021-02-22: qty 10

## 2021-02-22 MED ORDER — HEPARIN SOD (PORK) LOCK FLUSH 100 UNIT/ML IV SOLN
500.0000 [IU] | Freq: Once | INTRAVENOUS | Status: AC | PRN
Start: 2021-02-22 — End: 2021-02-22
  Administered 2021-02-22: 500 [IU]
  Filled 2021-02-22: qty 5

## 2021-02-22 MED ORDER — SODIUM CHLORIDE 0.9 % IV SOLN
1200.0000 mg | Freq: Once | INTRAVENOUS | Status: AC
  Administered 2021-02-22: 1200 mg via INTRAVENOUS
  Filled 2021-02-22: qty 20

## 2021-02-22 MED ORDER — ALTEPLASE 2 MG IJ SOLR
INTRAMUSCULAR | Status: AC
  Filled 2021-02-22: qty 2

## 2021-02-22 MED ORDER — ALTEPLASE 2 MG IJ SOLR
2.0000 mg | Freq: Once | INTRAMUSCULAR | Status: AC
  Administered 2021-02-22: 2 mg
  Filled 2021-02-22: qty 2

## 2021-02-22 MED ORDER — SODIUM CHLORIDE 0.9% FLUSH
10.0000 mL | Freq: Once | INTRAVENOUS | Status: AC
  Administered 2021-02-22: 10 mL
  Filled 2021-02-22: qty 10

## 2021-02-22 NOTE — Patient Instructions (Signed)
[facility] Discharge Instructions for Patients Receiving Chemotherapy  Today you received the following chemotherapy agents: Tecnetriq   To help prevent nausea and vomiting after your treatment, we encourage you to take your nausea medication as directed.   If you develop nausea and vomiting that is not controlled by your nausea medication, call the clinic.   BELOW ARE SYMPTOMS THAT SHOULD BE REPORTED IMMEDIATELY:  *FEVER GREATER THAN 100.5 F  *CHILLS WITH OR WITHOUT FEVER  NAUSEA AND VOMITING THAT IS NOT CONTROLLED WITH YOUR NAUSEA MEDICATION  *UNUSUAL SHORTNESS OF BREATH  *UNUSUAL BRUISING OR BLEEDING  TENDERNESS IN MOUTH AND THROAT WITH OR WITHOUT PRESENCE OF ULCERS  *URINARY PROBLEMS  *BOWEL PROBLEMS  UNUSUAL RASH Items with * indicate a potential emergency and should be followed up as soon as possible.  Feel free to call the clinic should you have any questions or concerns. The clinic phone number is [phone].  Please show the [facility] at check-in to the Emergency Department and triage nurse.

## 2021-02-22 NOTE — Patient Instructions (Signed)
Steps to Quit Smoking Smoking tobacco is the leading cause of preventable death. It can affect almost every organ in the body. Smoking puts you and people around you at risk for many serious, long-lasting (chronic) diseases. Quitting smoking can be hard, but it is one of the best things that you can do for your health. It is never too late to quit. How do I get ready to quit? When you decide to quit smoking, make a plan to help you succeed. Before you quit:  Pick a date to quit. Set a date within the next 2 weeks to give you time to prepare.  Write down the reasons why you are quitting. Keep this list in places where you will see it often.  Tell your family, friends, and co-workers that you are quitting. Their support is important.  Talk with your doctor about the choices that may help you quit.  Find out if your health insurance will pay for these treatments.  Know the people, places, things, and activities that make you want to smoke (triggers). Avoid them. What first steps can I take to quit smoking?  Throw away all cigarettes at home, at work, and in your car.  Throw away the things that you use when you smoke, such as ashtrays and lighters.  Clean your car. Make sure to empty the ashtray.  Clean your home, including curtains and carpets. What can I do to help me quit smoking? Talk with your doctor about taking medicines and seeing a counselor at the same time. You are more likely to succeed when you do both.  If you are pregnant or breastfeeding, talk with your doctor about counseling or other ways to quit smoking. Do not take medicine to help you quit smoking unless your doctor tells you to do so. To quit smoking: Quit right away  Quit smoking totally, instead of slowly cutting back on how much you smoke over a period of time.  Go to counseling. You are more likely to quit if you go to counseling sessions regularly. Take medicine You may take medicines to help you quit. Some  medicines need a prescription, and some you can buy over-the-counter. Some medicines may contain a drug called nicotine to replace the nicotine in cigarettes. Medicines may:  Help you to stop having the desire to smoke (cravings).  Help to stop the problems that come when you stop smoking (withdrawal symptoms). Your doctor may ask you to use:  Nicotine patches, gum, or lozenges.  Nicotine inhalers or sprays.  Non-nicotine medicine that is taken by mouth. Find resources Find resources and other ways to help you quit smoking and remain smoke-free after you quit. These resources are most helpful when you use them often. They include:  Online chats with a counselor.  Phone quitlines.  Printed self-help materials.  Support groups or group counseling.  Text messaging programs.  Mobile phone apps. Use apps on your mobile phone or tablet that can help you stick to your quit plan. There are many free apps for mobile phones and tablets as well as websites. Examples include Quit Guide from the CDC and smokefree.gov   What things can I do to make it easier to quit?  Talk to your family and friends. Ask them to support and encourage you.  Call a phone quitline (1-800-QUIT-NOW), reach out to support groups, or work with a counselor.  Ask people who smoke to not smoke around you.  Avoid places that make you want to smoke,   such as: ? Bars. ? Parties. ? Smoke-break areas at work.  Spend time with people who do not smoke.  Lower the stress in your life. Stress can make you want to smoke. Try these things to help your stress: ? Getting regular exercise. ? Doing deep-breathing exercises. ? Doing yoga. ? Meditating. ? Doing a body scan. To do this, close your eyes, focus on one area of your body at a time from head to toe. Notice which parts of your body are tense. Try to relax the muscles in those areas.   How will I feel when I quit smoking? Day 1 to 3 weeks Within the first 24 hours,  you may start to have some problems that come from quitting tobacco. These problems are very bad 2-3 days after you quit, but they do not often last for more than 2-3 weeks. You may get these symptoms:  Mood swings.  Feeling restless, nervous, angry, or annoyed.  Trouble concentrating.  Dizziness.  Strong desire for high-sugar foods and nicotine.  Weight gain.  Trouble pooping (constipation).  Feeling like you may vomit (nausea).  Coughing or a sore throat.  Changes in how the medicines that you take for other issues work in your body.  Depression.  Trouble sleeping (insomnia). Week 3 and afterward After the first 2-3 weeks of quitting, you may start to notice more positive results, such as:  Better sense of smell and taste.  Less coughing and sore throat.  Slower heart rate.  Lower blood pressure.  Clearer skin.  Better breathing.  Fewer sick days. Quitting smoking can be hard. Do not give up if you fail the first time. Some people need to try a few times before they succeed. Do your best to stick to your quit plan, and talk with your doctor if you have any questions or concerns. Summary  Smoking tobacco is the leading cause of preventable death. Quitting smoking can be hard, but it is one of the best things that you can do for your health.  When you decide to quit smoking, make a plan to help you succeed.  Quit smoking right away, not slowly over a period of time.  When you start quitting, seek help from your doctor, family, or friends. This information is not intended to replace advice given to you by your health care provider. Make sure you discuss any questions you have with your health care provider. Document Revised: 08/30/2019 Document Reviewed: 02/23/2019 Elsevier Patient Education  2021 [employer]  

## 2021-02-22 NOTE — Progress Notes (Signed)
[place] [phone]   Fax:(336) [place], MD [address] [address]  DIAGNOSIS: Recurrent small cell lung cancer initially diagnosed as Limited stage (T2b, N2, M0) small cell lung cancer diagnosed in November 2017 and presented with large right hilar mass with mediastinal invasion and a right lower lobe pulmonary nodule.  The patient had disease recurrence in the right lower lobe and mediastinal lymph nodes in February 2020.  PRIOR THERAPY:   1) Systemic chemotherapy with cisplatin 60 MG/M2 on day 1 and etoposide 120 MG/M2 on days 1, 2 and 3 concurrent with radiation status post 6 cycles.  Last dose was giving March 06, 2017. 2) status post prophylactic cranial irradiation. 3) palliative radiotherapy to the left hilar obstructive soft tissue mass under the care of Dr. Tammi Klippel.  CURRENT THERAPY: Systemic chemotherapy with carboplatin for AUC of 5 on day 1, etoposide.  100 mg/M2 on days 1, 2 and 3 as well as Tecentriq 1200 mg IV with Neulasta support every 3 weeks.  First dose March 05, 2019.  Status post 24 cycles.  Starting from cycle 5, the patient will be treated with single agent Tecentriq 1200 mg IV every 3 weeks.  First dose May 28, 2019.  The patient also received few cycles of immunotherapy in [place].  INTERVAL HISTORY: Tammy Bowers 70 y.o. female returns to the clinic today for follow-up visit.  The patient is feeling fine today with no concerning complaints except for shortness of breath with exertion.  She denied having any chest pain, cough or hemoptysis.  She denied having any fever or chills.  She has no nausea, vomiting, diarrhea or constipation.  She denied having any headache or visual changes.  She has no weight loss or night sweats.  She continues to tolerate her treatment with maintenance Tecentriq fairly well.  She had repeat CT scan of the chest, abdomen pelvis performed  recently and she is here for evaluation and discussion of her risk her results.   MEDICAL HISTORY: Past Medical History:  Diagnosis Date  . Adenopathy 10/10/2016   PERICARINAL  . Anemia   . Arthritis   . Asthma   . COPD (chronic obstructive pulmonary disease) ([place])   . Dyspnea   . Headache    migraines  . Hilar mass 10/10/2016   RIGHT  . History of kidney stones   . Insomnia   . Lung nodules 10/10/2016   RIGHT LOWER LOBE  . Mass of both adrenal glands ([place] [place]) 10/10/2016  . Mood swing   . Pneumonia   . SCL CA dx'd 2017   lung cancer Small Cell; recurrence 10/2018  . Spinal headache   . Substance abuse ([place])    TOBACCO  . UNSPECIFIED INFECTION OF BONE ANKLE AND FOOT 10/22/2009   Annotation: left ankle Qualifier: Diagnosis of  By: Patsy Baltimore RN, Denise      ALLERGIES:  is allergic to lidocaine.  MEDICATIONS:  Current Outpatient Medications  Medication Sig Dispense Refill  . albuterol (PROVENTIL HFA;VENTOLIN HFA) 108 (90 BASE) MCG/ACT inhaler Inhale 2 puffs into the lungs every 4 (four) hours as needed for wheezing.    . clonazePAM (KLONOPIN) 0.5 MG tablet     . Cyclobenzaprine HCl (FLEXERIL PO) Take 5 mg by mouth 3 (three) times daily as needed (muscle spasms).     . diphenoxylate-atropine (LOMOTIL) 2.5-0.025 MG tablet Take 1 tablet by mouth 4 (four) times daily as  needed for diarrhea or loose stools. 30 tablet 0  . Fluticasone-Salmeterol (ADVAIR) 500-50 MCG/DOSE AEPB Inhale 1 puff into the lungs 2 (two) times daily.    Marland Kitchen HYDROcodone-acetaminophen (HYCET) 7.5-325 mg/15 ml solution Take 10 mLs by mouth 4 (four) times daily as needed for moderate pain (use before meals and at bedtime prn). 473 mL 0  . ibuprofen (ADVIL) 800 MG tablet Take 800 mg by mouth every 8 (eight) hours as needed.    Marland Kitchen levothyroxine (SYNTHROID) 112 MCG tablet Take 1 tablet (112 mcg total) by mouth daily before breakfast. 90 tablet 1  . Multiple Vitamin (MULTIVITAMIN WITH MINERALS) TABS tablet Take 1 tablet by  mouth daily.     Marland Kitchen OVER THE COUNTER MEDICATION 1 capsule. [place] tail-"mushroom in a capsule"     . prochlorperazine (COMPAZINE) 10 MG tablet TAKE 1 TABLET(10 MG) BY MOUTH EVERY 6 HOURS AS NEEDED FOR NAUSEA OR VOMITING 30 tablet 0  . sucralfate (CARAFATE) 1 g tablet Take 1 tablet (1 g total) by mouth 4 (four) times daily -  with meals and at bedtime. Dissolve tablet in 4oz of water and drink 60 tablet 0  . topiramate (TOPAMAX) 25 MG tablet TAKE 2 TABLETS BY MOUTH TWICE DAILY 360 tablet 1   No current facility-administered medications for this visit.    SURGICAL HISTORY:  Past Surgical History:  Procedure Laterality Date  . ANKLE SURGERY     hardware removal, and initial ankle surgery  . APPENDECTOMY    . IR IMAGING GUIDED PORT INSERTION  04/12/2019  . LUNG BIOPSY N/A 10/19/2016   Procedure: LUNG BIOPSY;  Surgeon: Grace Isaac, MD;  Location: [facility];  Service: Thoracic;  Laterality: N/A;  . LUNG BIOPSY N/A 02/20/2019   Procedure: LUNG BIOPSY;  Surgeon: Grace Isaac, MD;  Location: [facility];  Service: Thoracic;  Laterality: N/A;  . TONSILLECTOMY    . VIDEO BRONCHOSCOPY WITH ENDOBRONCHIAL ULTRASOUND N/A 10/19/2016   Procedure: VIDEO BRONCHOSCOPY WITH ENDOBRONCHIAL ULTRASOUND;  Surgeon: Grace Isaac, MD;  Location: [facility];  Service: Thoracic;  Laterality: N/A;  . VIDEO BRONCHOSCOPY WITH ENDOBRONCHIAL ULTRASOUND N/A 02/20/2019   Procedure: VIDEO BRONCHOSCOPY WITH ENDOBRONCHIAL ULTRASOUND;  Surgeon: Grace Isaac, MD;  Location: [facility];  Service: Thoracic;  Laterality: N/A;    REVIEW OF SYSTEMS:  Constitutional: positive for fatigue Eyes: negative Ears, nose, mouth, throat, and face: negative Respiratory: positive for dyspnea on exertion Cardiovascular: negative Gastrointestinal: negative Genitourinary:negative Integument/breast: negative Hematologic/lymphatic: negative Musculoskeletal:negative Neurological: negative Behavioral/Psych: negative Endocrine:  negative Allergic/Immunologic: negative   PHYSICAL EXAMINATION: General appearance: alert, cooperative, fatigued and no distress Head: Normocephalic, without obvious abnormality, atraumatic Neck: no adenopathy, no JVD, supple, symmetrical, trachea midline and thyroid not enlarged, symmetric, no tenderness/mass/nodules Lymph nodes: Cervical, supraclavicular, and axillary nodes normal. Resp: clear to auscultation bilaterally Back: symmetric, no curvature. ROM normal. No CVA tenderness. Cardio: regular rate and rhythm, S1, S2 normal, no murmur, click, rub or gallop GI: soft, non-tender; bowel sounds normal; no masses,  no organomegaly Extremities: extremities normal, atraumatic, no cyanosis or edema Neurologic: Alert and oriented X 3, normal strength and tone. Normal symmetric reflexes. Normal coordination and gait  ECOG PERFORMANCE STATUS: 1 - Symptomatic but completely ambulatory  Blood pressure 113/87, pulse (!) 108, temperature 97.6 F (36.4 C), temperature source Tympanic, resp. rate 17, height 5\' 6"  (1.676 m), weight 217 lb 14.4 oz (98.8 kg), SpO2 100 %.  LABORATORY DATA: Lab Results  Component Value Date   WBC 6.0 02/01/2021  HGB 13.4 02/01/2021   HCT 39.0 02/01/2021   MCV 90.3 02/01/2021   PLT 252 02/01/2021      Chemistry      Component Value Date/Time   NA 138 02/01/2021 1110   NA 139 11/24/2017 0913   K 3.8 02/01/2021 1110   K 3.8 11/24/2017 0913   CL 111 02/01/2021 1110   CO2 21 (L) 02/01/2021 1110   CO2 24 11/24/2017 0913   BUN 25 (H) 02/01/2021 1110   BUN 22.1 11/24/2017 0913   CREATININE 1.26 (H) 02/01/2021 1110   CREATININE 1.2 (H) 11/24/2017 0913      Component Value Date/Time   CALCIUM 8.8 (L) 02/01/2021 1110   CALCIUM 9.7 11/24/2017 0913   ALKPHOS 110 02/01/2021 1110   ALKPHOS 99 11/24/2017 0913   AST 13 (L) 02/01/2021 1110   AST 15 11/24/2017 0913   ALT 7 02/01/2021 1110   ALT 7 11/24/2017 0913   BILITOT 0.2 (L) 02/01/2021 1110   BILITOT 0.37  11/24/2017 0913       RADIOGRAPHIC STUDIES: CT Chest W Contrast  Result Date: 02/21/2021 CLINICAL DATA:  Right small cell lung cancer restaging EXAM: CT CHEST, ABDOMEN, AND PELVIS WITH CONTRAST TECHNIQUE: Multidetector CT imaging of the chest, abdomen and pelvis was performed following the standard protocol during bolus administration of intravenous contrast. CONTRAST:  162mL OMNIPAQUE IOHEXOL 300 MG/ML SOLN, additional oral enteric contrast COMPARISON:  11/03/2020 FINDINGS: CT CHEST FINDINGS Cardiovascular: Right chest port catheter. Aortic atherosclerosis normal heart size. No pericardial effusion. Mediastinum/Nodes: Unchanged post treatment appearance of soft tissue about the right hilum. No discretely enlarged mediastinal, hilar, or axillary lymph nodes. Calcified mediastinal and right hilar lymph nodes. Thyroid gland, trachea, and esophagus demonstrate no significant findings. Lungs/Pleura: Diffuse bilateral bronchial wall thickening. Unchanged post treatment appearance of the right chest with perihilar and paramedian fibrosis and consolidation. Post treatment appearance of superior segment right lower lobe pulmonary nodule is unchanged (series 6, image 74). Small calcified nodule of the medial segment right middle lobe with associated bandlike scarring. No pleural effusion or pneumothorax. Musculoskeletal: No chest wall mass or suspicious bone lesions identified. CT ABDOMEN PELVIS FINDINGS Hepatobiliary: No solid liver abnormality is seen. No gallstones, gallbladder wall thickening, or biliary dilatation. Pancreas: Unremarkable. No pancreatic ductal dilatation or surrounding inflammatory changes. Spleen: Normal in size without significant abnormality. Adrenals/Urinary Tract: Stable, definitively benign right adrenal adenoma. Punctuate, nonobstructive calculus of the superior pole of the left kidney. Unchanged hypodense, partially exophytic lesion of the posterior superior pole of the left kidney, stable  compared to prior examinations and almost certainly a benign subcentimeter cyst (series 2, image 64). No hydronephrosis. Bladder is unremarkable. Stomach/Bowel: Stomach is within normal limits. Appendix is not clearly visualized and may be surgically absent. No evidence of bowel wall thickening, distention, or inflammatory changes. Vascular/Lymphatic: Aortic atherosclerosis. No enlarged abdominal or pelvic lymph nodes. Reproductive: No mass or other abnormality. Other: No abdominal wall hernia or abnormality. No abdominopelvic ascites. Musculoskeletal: No acute or significant osseous findings. IMPRESSION: 1. Unchanged post treatment appearance of the right chest with perihilar and paramedian fibrosis and consolidation as well as soft tissue about the right hilum. Unchanged post treatment appearance of superior segment right lower lobe pulmonary nodule. 2. No evidence of recurrent or metastatic disease in the chest, abdomen, or pelvis. 3. Diffuse bilateral bronchial wall thickening, consistent with nonspecific infectious or inflammatory bronchitis. 4. Nonobstructive left nephrolithiasis. Aortic Atherosclerosis (ICD10-I70.0). Electronically Signed   By: Eddie Candle M.D.   On: 02/21/2021  10:37   CT Abdomen Pelvis W Contrast  Result Date: 02/21/2021 CLINICAL DATA:  Right small cell lung cancer restaging EXAM: CT CHEST, ABDOMEN, AND PELVIS WITH CONTRAST TECHNIQUE: Multidetector CT imaging of the chest, abdomen and pelvis was performed following the standard protocol during bolus administration of intravenous contrast. CONTRAST:  141mL OMNIPAQUE IOHEXOL 300 MG/ML SOLN, additional oral enteric contrast COMPARISON:  11/03/2020 FINDINGS: CT CHEST FINDINGS Cardiovascular: Right chest port catheter. Aortic atherosclerosis normal heart size. No pericardial effusion. Mediastinum/Nodes: Unchanged post treatment appearance of soft tissue about the right hilum. No discretely enlarged mediastinal, hilar, or axillary lymph nodes.  Calcified mediastinal and right hilar lymph nodes. Thyroid gland, trachea, and esophagus demonstrate no significant findings. Lungs/Pleura: Diffuse bilateral bronchial wall thickening. Unchanged post treatment appearance of the right chest with perihilar and paramedian fibrosis and consolidation. Post treatment appearance of superior segment right lower lobe pulmonary nodule is unchanged (series 6, image 74). Small calcified nodule of the medial segment right middle lobe with associated bandlike scarring. No pleural effusion or pneumothorax. Musculoskeletal: No chest wall mass or suspicious bone lesions identified. CT ABDOMEN PELVIS FINDINGS Hepatobiliary: No solid liver abnormality is seen. No gallstones, gallbladder wall thickening, or biliary dilatation. Pancreas: Unremarkable. No pancreatic ductal dilatation or surrounding inflammatory changes. Spleen: Normal in size without significant abnormality. Adrenals/Urinary Tract: Stable, definitively benign right adrenal adenoma. Punctuate, nonobstructive calculus of the superior pole of the left kidney. Unchanged hypodense, partially exophytic lesion of the posterior superior pole of the left kidney, stable compared to prior examinations and almost certainly a benign subcentimeter cyst (series 2, image 64). No hydronephrosis. Bladder is unremarkable. Stomach/Bowel: Stomach is within normal limits. Appendix is not clearly visualized and may be surgically absent. No evidence of bowel wall thickening, distention, or inflammatory changes. Vascular/Lymphatic: Aortic atherosclerosis. No enlarged abdominal or pelvic lymph nodes. Reproductive: No mass or other abnormality. Other: No abdominal wall hernia or abnormality. No abdominopelvic ascites. Musculoskeletal: No acute or significant osseous findings. IMPRESSION: 1. Unchanged post treatment appearance of the right chest with perihilar and paramedian fibrosis and consolidation as well as soft tissue about the right hilum.  Unchanged post treatment appearance of superior segment right lower lobe pulmonary nodule. 2. No evidence of recurrent or metastatic disease in the chest, abdomen, or pelvis. 3. Diffuse bilateral bronchial wall thickening, consistent with nonspecific infectious or inflammatory bronchitis. 4. Nonobstructive left nephrolithiasis. Aortic Atherosclerosis (ICD10-I70.0). Electronically Signed   By: Eddie Candle M.D.   On: 02/21/2021 10:37    ASSESSMENT AND PLAN:  This is a very pleasant 70 years old white female with recurrent small cell lung cancer that was initially diagnosed as limited stage disease status post a course of systemic chemotherapy with cisplatin and etoposide for 6 cycles concurrent with radiation. She tolerated her treatment well except for fatigue. She is also status post prophylactic cranial irradiation. The patient has been in observation since March 2018. She was found on recent imaging studies to have evidence for disease recurrence with right lower lobe pulmonary nodules in addition to mediastinal lymphadenopathy. Repeat bronchoscopy with endobronchial ultrasound and biopsy confirmed recurrence of his small cell lung cancer. The patient was started on systemic chemotherapy with carboplatin, etoposide and Tecentriq status post 24 cycles.  She has partial response after cycle #4 and starting from cycle #5 the patient is currently on maintenance treatment with Tecentriq.  The patient has been tolerating this treatment well with no concerning adverse effects. She had repeat CT scan of the chest, abdomen pelvis performed recently.  I personally and independently reviewed the scans and discussed the results with the patient today. Her scan showed no concerning findings for disease progression. I recommended for the patient to continue on her current maintenance treatment with Tecentriq and she will proceed with cycle #25 today. The patient will come back for follow-up visit in 3 weeks for  evaluation before the next cycle of her treatment. For the hypothyroidism, she will continue her current treatment with levothyroxine and will monitor her TSH closely and adjust her dose as needed. She was advised to call immediately if she has any other concerning symptoms in the interval. The patient voices understanding of current disease status and treatment options and is in agreement with the current care plan.  All questions were answered. The patient knows to call the clinic with any problems, questions or concerns. We can certainly see the patient much sooner if necessary.  Disclaimer: This note was dictated with voice recognition software. Similar sounding words can inadvertently be transcribed and may not be corrected upon review.

## 2021-02-23 ENCOUNTER — Telehealth: Payer: Self-pay | Admitting: Internal Medicine

## 2021-02-23 NOTE — Telephone Encounter (Signed)
Scheduled per los. Called and spoke with patient. Confirmed appt 

## 2021-03-15 ENCOUNTER — Inpatient Hospital Stay (HOSPITAL_BASED_OUTPATIENT_CLINIC_OR_DEPARTMENT_OTHER): Payer: Medicare Other | Admitting: Internal Medicine

## 2021-03-15 ENCOUNTER — Other Ambulatory Visit: Payer: Self-pay

## 2021-03-15 ENCOUNTER — Telehealth: Payer: Self-pay | Admitting: Medical Oncology

## 2021-03-15 ENCOUNTER — Inpatient Hospital Stay: Payer: Medicare Other

## 2021-03-15 ENCOUNTER — Encounter: Payer: Self-pay | Admitting: Internal Medicine

## 2021-03-15 VITALS — BP 114/88 | HR 96 | Temp 97.2°F | Resp 19 | Ht 66.0 in | Wt 216.7 lb

## 2021-03-15 DIAGNOSIS — Z5112 Encounter for antineoplastic immunotherapy: Secondary | ICD-10-CM

## 2021-03-15 DIAGNOSIS — C3491 Malignant neoplasm of unspecified part of right bronchus or lung: Secondary | ICD-10-CM | POA: Diagnosis not present

## 2021-03-15 DIAGNOSIS — Z95828 Presence of other vascular implants and grafts: Secondary | ICD-10-CM

## 2021-03-15 DIAGNOSIS — C3431 Malignant neoplasm of lower lobe, right bronchus or lung: Secondary | ICD-10-CM | POA: Diagnosis not present

## 2021-03-15 DIAGNOSIS — Z923 Personal history of irradiation: Secondary | ICD-10-CM | POA: Diagnosis not present

## 2021-03-15 DIAGNOSIS — E039 Hypothyroidism, unspecified: Secondary | ICD-10-CM

## 2021-03-15 DIAGNOSIS — Z7951 Long term (current) use of inhaled steroids: Secondary | ICD-10-CM | POA: Diagnosis not present

## 2021-03-15 DIAGNOSIS — Z79899 Other long term (current) drug therapy: Secondary | ICD-10-CM | POA: Diagnosis not present

## 2021-03-15 LAB — CMP (CANCER CENTER ONLY)
ALT: 10 U/L (ref 0–44)
AST: 11 U/L — ABNORMAL LOW (ref 15–41)
Albumin: 3.3 g/dL — ABNORMAL LOW (ref 3.5–5.0)
Alkaline Phosphatase: 113 U/L (ref 38–126)
Anion gap: 9 (ref 5–15)
BUN: 25 mg/dL — ABNORMAL HIGH (ref 8–23)
CO2: 23 mmol/L (ref 22–32)
Calcium: 8.7 mg/dL — ABNORMAL LOW (ref 8.9–10.3)
Chloride: 110 mmol/L (ref 98–111)
Creatinine: 1.1 mg/dL — ABNORMAL HIGH (ref 0.44–1.00)
GFR, Estimated: 55 mL/min — ABNORMAL LOW (ref >60)
Glucose, Bld: 75 mg/dL (ref 70–99)
Potassium: 3.4 mmol/L — ABNORMAL LOW (ref 3.5–5.1)
Sodium: 142 mmol/L (ref 135–145)
Total Bilirubin: 0.3 mg/dL (ref 0.3–1.2)
Total Protein: 6.8 g/dL (ref 6.5–8.1)

## 2021-03-15 LAB — CBC WITH DIFFERENTIAL (CANCER CENTER ONLY)
Abs Immature Granulocytes: 0.01 10*3/uL (ref 0.00–0.07)
Basophils Absolute: 0.1 10*3/uL (ref 0.0–0.1)
Basophils Relative: 1 %
Eosinophils Absolute: 0.2 10*3/uL (ref 0.0–0.5)
Eosinophils Relative: 4 %
HCT: 40.5 % (ref 36.0–46.0)
Hemoglobin: 13.5 g/dL (ref 12.0–15.0)
Immature Granulocytes: 0 %
Lymphocytes Relative: 13 %
Lymphs Abs: 0.7 10*3/uL (ref 0.7–4.0)
MCH: 30.3 pg (ref 26.0–34.0)
MCHC: 33.3 g/dL (ref 30.0–36.0)
MCV: 90.8 fL (ref 80.0–100.0)
Monocytes Absolute: 0.5 10*3/uL (ref 0.1–1.0)
Monocytes Relative: 8 %
Neutro Abs: 4.2 10*3/uL (ref 1.7–7.7)
Neutrophils Relative %: 74 %
Platelet Count: 250 10*3/uL (ref 150–400)
RBC: 4.46 MIL/uL (ref 3.87–5.11)
RDW: 12.5 % (ref 11.5–15.5)
WBC Count: 5.7 10*3/uL (ref 4.0–10.5)
nRBC: 0 % (ref 0.0–0.2)

## 2021-03-15 MED ORDER — SODIUM CHLORIDE 0.9% FLUSH
10.0000 mL | Freq: Once | INTRAVENOUS | Status: AC
  Administered 2021-03-15: 10 mL
  Filled 2021-03-15: qty 10

## 2021-03-15 MED ORDER — ALTEPLASE 2 MG IJ SOLR
2.0000 mg | Freq: Once | INTRAMUSCULAR | Status: AC
  Administered 2021-03-15: 2 mg
  Filled 2021-03-15: qty 2

## 2021-03-15 NOTE — Progress Notes (Signed)
[facility] [phone]   Fax:([phone]) [place], [place] [address] [place] [address]  DIAGNOSIS: Recurrent small cell lung cancer initially diagnosed as Limited stage (T2b, N2, M0) small cell lung cancer diagnosed in November 2017 and presented with large right hilar mass with mediastinal invasion and a right lower lobe pulmonary nodule.  The patient had disease recurrence in the right lower lobe and mediastinal lymph nodes in February 2020.  PRIOR THERAPY:   1) Systemic chemotherapy with cisplatin 60 MG/M2 on day 1 and etoposide 120 MG/M2 on days 1, 2 and 3 concurrent with radiation status post 6 cycles.  Last dose was giving March 06, 2017. 2) status post prophylactic cranial irradiation. 3) palliative radiotherapy to the left hilar obstructive soft tissue mass under the care of Dr. Tammi Klippel.  CURRENT THERAPY: Systemic chemotherapy with carboplatin for AUC of 5 on day 1, etoposide.  100 mg/M2 on days 1, 2 and 3 as well as Tecentriq 1200 mg IV with Neulasta support every 3 weeks.  First dose March 05, 2019.  Status post 25 cycles.  Starting from cycle 5, the patient will be treated with single agent Tecentriq 1200 mg IV every 3 weeks.  First dose May 28, 2019.  The patient also received few cycles of immunotherapy in [place].  INTERVAL HISTORY: Tammy Bowers 70 y.o. female returns to the clinic today for follow-up visit.  The patient is feeling fine today with no concerning complaints.  The patient is feeling fine today with no concerning complaints except for the baseline shortness of breath and she is currently on home oxygen.  She also has fatigue.  She denied having any chest pain, cough or hemoptysis.  She denied having any recent weight loss or night sweats.  She has no nausea, vomiting, diarrhea or constipation.  She has no headache or visual changes.  She continues to tolerate her treatment with Tecentriq  fairly well.  She is here today for evaluation before starting cycle #26.  MEDICAL HISTORY: Past Medical History:  Diagnosis Date  . Adenopathy 10/10/2016   PERICARINAL  . Anemia   . Arthritis   . Asthma   . COPD (chronic obstructive pulmonary disease) ([facility] [place])   . Dyspnea   . Headache    migraines  . Hilar mass 10/10/2016   RIGHT  . History of kidney stones   . Insomnia   . Lung nodules 10/10/2016   RIGHT LOWER LOBE  . Mass of both adrenal glands ([place]) 10/10/2016  . Mood swing   . Pneumonia   . SCL CA dx'd 2017   lung cancer Small Cell; recurrence 10/2018  . Spinal headache   . Substance abuse ([place])    TOBACCO  . UNSPECIFIED INFECTION OF BONE ANKLE AND FOOT 10/22/2009   Annotation: left ankle Qualifier: Diagnosis of  By: Patsy Baltimore RN, Denise      ALLERGIES:  is allergic to lidocaine.  MEDICATIONS:  Current Outpatient Medications  Medication Sig Dispense Refill  . albuterol (PROVENTIL HFA;VENTOLIN HFA) 108 (90 BASE) MCG/ACT inhaler Inhale 2 puffs into the lungs every 4 (four) hours as needed for wheezing.    . clonazePAM (KLONOPIN) 0.5 MG tablet     . cyclobenzaprine (FLEXERIL) 5 MG tablet Take 1 tablet by mouth 3 times/day as needed-between meals & bedtime.    . diphenoxylate-atropine (LOMOTIL) 2.5-0.025 MG tablet Take 1 tablet by mouth 4 (four) times daily as needed for  diarrhea or loose stools. 30 tablet 0  . Fluticasone-Salmeterol (ADVAIR) 500-50 MCG/DOSE AEPB Inhale 1 puff into the lungs 2 (two) times daily.    Marland Kitchen HYDROcodone-acetaminophen (HYCET) 7.5-325 mg/15 ml solution Take 10 mLs by mouth 4 (four) times daily as needed for moderate pain (use before meals and at bedtime prn). 473 mL 0  . ibuprofen (ADVIL) 800 MG tablet Take 800 mg by mouth every 8 (eight) hours as needed.    Marland Kitchen levothyroxine (SYNTHROID) 112 MCG tablet Take 1 tablet (112 mcg total) by mouth daily before breakfast. 90 tablet 1  . Multiple Vitamin (MULTIVITAMIN WITH MINERALS) TABS tablet Take 1 tablet by  mouth daily.     Marland Kitchen OVER THE COUNTER MEDICATION 1 capsule. [place] tail-"mushroom in a capsule"     . prochlorperazine (COMPAZINE) 10 MG tablet TAKE 1 TABLET(10 MG) BY MOUTH EVERY 6 HOURS AS NEEDED FOR NAUSEA OR VOMITING 30 tablet 0  . topiramate (TOPAMAX) 25 MG tablet TAKE 2 TABLETS BY MOUTH TWICE DAILY 360 tablet 1   No current facility-administered medications for this visit.    SURGICAL HISTORY:  Past Surgical History:  Procedure Laterality Date  . ANKLE SURGERY     hardware removal, and initial ankle surgery  . APPENDECTOMY    . IR IMAGING GUIDED PORT INSERTION  04/12/2019  . LUNG BIOPSY N/A 10/19/2016   Procedure: LUNG BIOPSY;  Surgeon: Grace Isaac, MD;  Location: [facility];  Service: Thoracic;  Laterality: N/A;  . LUNG BIOPSY N/A 02/20/2019   Procedure: LUNG BIOPSY;  Surgeon: Grace Isaac, MD;  Location: [facility];  Service: Thoracic;  Laterality: N/A;  . TONSILLECTOMY    . VIDEO BRONCHOSCOPY WITH ENDOBRONCHIAL ULTRASOUND N/A 10/19/2016   Procedure: VIDEO BRONCHOSCOPY WITH ENDOBRONCHIAL ULTRASOUND;  Surgeon: Grace Isaac, MD;  Location: [facility];  Service: Thoracic;  Laterality: N/A;  . VIDEO BRONCHOSCOPY WITH ENDOBRONCHIAL ULTRASOUND N/A 02/20/2019   Procedure: VIDEO BRONCHOSCOPY WITH ENDOBRONCHIAL ULTRASOUND;  Surgeon: Grace Isaac, MD;  Location: [facility];  Service: Thoracic;  Laterality: N/A;    REVIEW OF SYSTEMS:  A comprehensive review of systems was negative except for: Constitutional: positive for fatigue Respiratory: positive for dyspnea on exertion   PHYSICAL EXAMINATION: General appearance: alert, cooperative, fatigued and no distress Head: Normocephalic, without obvious abnormality, atraumatic Neck: no adenopathy, no JVD, supple, symmetrical, trachea midline and thyroid not enlarged, symmetric, no tenderness/mass/nodules Lymph nodes: Cervical, supraclavicular, and axillary nodes normal. Resp: clear to auscultation bilaterally Back: symmetric, no curvature. ROM  normal. No CVA tenderness. Cardio: regular rate and rhythm, S1, S2 normal, no murmur, click, rub or gallop GI: soft, non-tender; bowel sounds normal; no masses,  no organomegaly Extremities: extremities normal, atraumatic, no cyanosis or edema  ECOG PERFORMANCE STATUS: 1 - Symptomatic but completely ambulatory  Blood pressure 114/88, pulse 96, temperature (!) 97.2 F (36.2 C), temperature source Tympanic, resp. rate 19, height 5\' 6"  (1.676 m), weight 216 lb 11.2 oz (98.3 kg), SpO2 98 %.  LABORATORY DATA: Lab Results  Component Value Date   WBC 5.7 03/15/2021   HGB 13.5 03/15/2021   HCT 40.5 03/15/2021   MCV 90.8 03/15/2021   PLT 250 03/15/2021      Chemistry      Component Value Date/Time   NA 139 02/22/2021 1100   NA 139 11/24/2017 0913   K 3.7 02/22/2021 1100   K 3.8 11/24/2017 0913   CL 109 02/22/2021 1100   CO2 23 02/22/2021 1100   CO2 24 11/24/2017 0913  BUN 23 02/22/2021 1100   BUN 22.1 11/24/2017 0913   CREATININE 1.26 (H) 02/22/2021 1100   CREATININE 1.2 (H) 11/24/2017 0913      Component Value Date/Time   CALCIUM 9.1 02/22/2021 1100   CALCIUM 9.7 11/24/2017 0913   ALKPHOS 119 02/22/2021 1100   ALKPHOS 99 11/24/2017 0913   AST 15 02/22/2021 1100   AST 15 11/24/2017 0913   ALT 10 02/22/2021 1100   ALT 7 11/24/2017 0913   BILITOT 0.3 02/22/2021 1100   BILITOT 0.37 11/24/2017 0913       RADIOGRAPHIC STUDIES: CT Chest W Contrast  Result Date: 02/21/2021 CLINICAL DATA:  Right small cell lung cancer restaging EXAM: CT CHEST, ABDOMEN, AND PELVIS WITH CONTRAST TECHNIQUE: Multidetector CT imaging of the chest, abdomen and pelvis was performed following the standard protocol during bolus administration of intravenous contrast. CONTRAST:  149mL OMNIPAQUE IOHEXOL 300 MG/ML SOLN, additional oral enteric contrast COMPARISON:  11/03/2020 FINDINGS: CT CHEST FINDINGS Cardiovascular: Right chest port catheter. Aortic atherosclerosis normal heart size. No pericardial  effusion. Mediastinum/Nodes: Unchanged post treatment appearance of soft tissue about the right hilum. No discretely enlarged mediastinal, hilar, or axillary lymph nodes. Calcified mediastinal and right hilar lymph nodes. Thyroid gland, trachea, and esophagus demonstrate no significant findings. Lungs/Pleura: Diffuse bilateral bronchial wall thickening. Unchanged post treatment appearance of the right chest with perihilar and paramedian fibrosis and consolidation. Post treatment appearance of superior segment right lower lobe pulmonary nodule is unchanged (series 6, image 74). Small calcified nodule of the medial segment right middle lobe with associated bandlike scarring. No pleural effusion or pneumothorax. Musculoskeletal: No chest wall mass or suspicious bone lesions identified. CT ABDOMEN PELVIS FINDINGS Hepatobiliary: No solid liver abnormality is seen. No gallstones, gallbladder wall thickening, or biliary dilatation. Pancreas: Unremarkable. No pancreatic ductal dilatation or surrounding inflammatory changes. Spleen: Normal in size without significant abnormality. Adrenals/Urinary Tract: Stable, definitively benign right adrenal adenoma. Punctuate, nonobstructive calculus of the superior pole of the left kidney. Unchanged hypodense, partially exophytic lesion of the posterior superior pole of the left kidney, stable compared to prior examinations and almost certainly a benign subcentimeter cyst (series 2, image 64). No hydronephrosis. Bladder is unremarkable. Stomach/Bowel: Stomach is within normal limits. Appendix is not clearly visualized and may be surgically absent. No evidence of bowel wall thickening, distention, or inflammatory changes. Vascular/Lymphatic: Aortic atherosclerosis. No enlarged abdominal or pelvic lymph nodes. Reproductive: No mass or other abnormality. Other: No abdominal wall hernia or abnormality. No abdominopelvic ascites. Musculoskeletal: No acute or significant osseous findings.  IMPRESSION: 1. Unchanged post treatment appearance of the right chest with perihilar and paramedian fibrosis and consolidation as well as soft tissue about the right hilum. Unchanged post treatment appearance of superior segment right lower lobe pulmonary nodule. 2. No evidence of recurrent or metastatic disease in the chest, abdomen, or pelvis. 3. Diffuse bilateral bronchial wall thickening, consistent with nonspecific infectious or inflammatory bronchitis. 4. Nonobstructive left nephrolithiasis. Aortic Atherosclerosis (ICD10-I70.0). Electronically Signed   By: Eddie Candle M.D.   On: 02/21/2021 10:37   CT Abdomen Pelvis W Contrast  Result Date: 02/21/2021 CLINICAL DATA:  Right small cell lung cancer restaging EXAM: CT CHEST, ABDOMEN, AND PELVIS WITH CONTRAST TECHNIQUE: Multidetector CT imaging of the chest, abdomen and pelvis was performed following the standard protocol during bolus administration of intravenous contrast. CONTRAST:  117mL OMNIPAQUE IOHEXOL 300 MG/ML SOLN, additional oral enteric contrast COMPARISON:  11/03/2020 FINDINGS: CT CHEST FINDINGS Cardiovascular: Right chest port catheter. Aortic atherosclerosis normal heart size. No  pericardial effusion. Mediastinum/Nodes: Unchanged post treatment appearance of soft tissue about the right hilum. No discretely enlarged mediastinal, hilar, or axillary lymph nodes. Calcified mediastinal and right hilar lymph nodes. Thyroid gland, trachea, and esophagus demonstrate no significant findings. Lungs/Pleura: Diffuse bilateral bronchial wall thickening. Unchanged post treatment appearance of the right chest with perihilar and paramedian fibrosis and consolidation. Post treatment appearance of superior segment right lower lobe pulmonary nodule is unchanged (series 6, image 74). Small calcified nodule of the medial segment right middle lobe with associated bandlike scarring. No pleural effusion or pneumothorax. Musculoskeletal: No chest wall mass or suspicious  bone lesions identified. CT ABDOMEN PELVIS FINDINGS Hepatobiliary: No solid liver abnormality is seen. No gallstones, gallbladder wall thickening, or biliary dilatation. Pancreas: Unremarkable. No pancreatic ductal dilatation or surrounding inflammatory changes. Spleen: Normal in size without significant abnormality. Adrenals/Urinary Tract: Stable, definitively benign right adrenal adenoma. Punctuate, nonobstructive calculus of the superior pole of the left kidney. Unchanged hypodense, partially exophytic lesion of the posterior superior pole of the left kidney, stable compared to prior examinations and almost certainly a benign subcentimeter cyst (series 2, image 64). No hydronephrosis. Bladder is unremarkable. Stomach/Bowel: Stomach is within normal limits. Appendix is not clearly visualized and may be surgically absent. No evidence of bowel wall thickening, distention, or inflammatory changes. Vascular/Lymphatic: Aortic atherosclerosis. No enlarged abdominal or pelvic lymph nodes. Reproductive: No mass or other abnormality. Other: No abdominal wall hernia or abnormality. No abdominopelvic ascites. Musculoskeletal: No acute or significant osseous findings. IMPRESSION: 1. Unchanged post treatment appearance of the right chest with perihilar and paramedian fibrosis and consolidation as well as soft tissue about the right hilum. Unchanged post treatment appearance of superior segment right lower lobe pulmonary nodule. 2. No evidence of recurrent or metastatic disease in the chest, abdomen, or pelvis. 3. Diffuse bilateral bronchial wall thickening, consistent with nonspecific infectious or inflammatory bronchitis. 4. Nonobstructive left nephrolithiasis. Aortic Atherosclerosis (ICD10-I70.0). Electronically Signed   By: Eddie Candle M.D.   On: 02/21/2021 10:37    ASSESSMENT AND PLAN:  This is a very pleasant 70 years old white female with recurrent small cell lung cancer that was initially diagnosed as limited stage  disease status post a course of systemic chemotherapy with cisplatin and etoposide for 6 cycles concurrent with radiation. She tolerated her treatment well except for fatigue. She is also status post prophylactic cranial irradiation. The patient has been in observation since March 2018. She was found on recent imaging studies to have evidence for disease recurrence with right lower lobe pulmonary nodules in addition to mediastinal lymphadenopathy. Repeat bronchoscopy with endobronchial ultrasound and biopsy confirmed recurrence of his small cell lung cancer. The patient was started on systemic chemotherapy with carboplatin, etoposide and Tecentriq status post 25 cycles.  She has partial response after cycle #4 and starting from cycle #5 the patient is currently on maintenance treatment with Tecentriq.  The patient has been tolerating this treatment well with no concerning adverse effects. I recommended for her to proceed with cycle #26 today as planned. For the hypothyroidism, she will continue her current treatment with levothyroxine and will monitor her TSH closely and adjust her dose as needed. She will come back for follow-up visit in 3 weeks for evaluation before the next cycle of her treatment. The patient was advised to call immediately if she has any concerning symptoms in the interval. The patient voices understanding of current disease status and treatment options and is in agreement with the current care plan.  All questions  were answered. The patient knows to call the clinic with any problems, questions or concerns. We can certainly see the patient much sooner if necessary.  Disclaimer: This note was dictated with voice recognition software. Similar sounding words can inadvertently be transcribed and may not be corrected upon review.

## 2021-03-15 NOTE — Telephone Encounter (Signed)
Unable to reach pt for treatment. LVM on husbands phone.

## 2021-03-15 NOTE — Progress Notes (Signed)
Pt here for port flush no blood return from port. cathflo placed, Labs drawn peripheral.

## 2021-03-15 NOTE — Patient Instructions (Signed)
Steps to Quit Smoking Smoking tobacco is the leading cause of preventable death. It can affect almost every organ in the body. Smoking puts you and people around you at risk for many serious, long-lasting (chronic) diseases. Quitting smoking can be hard, but it is one of the best things that you can do for your health. It is never too late to quit. How do I get ready to quit? When you decide to quit smoking, make a plan to help you succeed. Before you quit:  Pick a date to quit. Set a date within the next 2 weeks to give you time to prepare.  Write down the reasons why you are quitting. Keep this list in places where you will see it often.  Tell your family, friends, and co-workers that you are quitting. Their support is important.  Talk with your doctor about the choices that may help you quit.  Find out if your health insurance will pay for these treatments.  Know the people, places, things, and activities that make you want to smoke (triggers). Avoid them. What first steps can I take to quit smoking?  Throw away all cigarettes at home, at work, and in your car.  Throw away the things that you use when you smoke, such as ashtrays and lighters.  Clean your car. Make sure to empty the ashtray.  Clean your home, including curtains and carpets. What can I do to help me quit smoking? Talk with your doctor about taking medicines and seeing a counselor at the same time. You are more likely to succeed when you do both.  If you are pregnant or breastfeeding, talk with your doctor about counseling or other ways to quit smoking. Do not take medicine to help you quit smoking unless your doctor tells you to do so. To quit smoking: Quit right away  Quit smoking totally, instead of slowly cutting back on how much you smoke over a period of time.  Go to counseling. You are more likely to quit if you go to counseling sessions regularly. Take medicine You may take medicines to help you quit. Some  medicines need a prescription, and some you can buy over-the-counter. Some medicines may contain a drug called nicotine to replace the nicotine in cigarettes. Medicines may:  Help you to stop having the desire to smoke (cravings).  Help to stop the problems that come when you stop smoking (withdrawal symptoms). Your doctor may ask you to use:  Nicotine patches, gum, or lozenges.  Nicotine inhalers or sprays.  Non-nicotine medicine that is taken by mouth. Find resources Find resources and other ways to help you quit smoking and remain smoke-free after you quit. These resources are most helpful when you use them often. They include:  Online chats with a counselor.  Phone quitlines.  Printed self-help materials.  Support groups or group counseling.  Text messaging programs.  Mobile phone apps. Use apps on your mobile phone or tablet that can help you stick to your quit plan. There are many free apps for mobile phones and tablets as well as websites. Examples include Quit Guide from the CDC and smokefree.gov   What things can I do to make it easier to quit?  Talk to your family and friends. Ask them to support and encourage you.  Call a phone quitline (1-800-QUIT-NOW), reach out to support groups, or work with a counselor.  Ask people who smoke to not smoke around you.  Avoid places that make you want to smoke,   such as: ? Bars. ? Parties. ? Smoke-break areas at work.  Spend time with people who do not smoke.  Lower the stress in your life. Stress can make you want to smoke. Try these things to help your stress: ? Getting regular exercise. ? Doing deep-breathing exercises. ? Doing yoga. ? Meditating. ? Doing a body scan. To do this, close your eyes, focus on one area of your body at a time from head to toe. Notice which parts of your body are tense. Try to relax the muscles in those areas.   How will I feel when I quit smoking? Day 1 to 3 weeks Within the first 24 hours,  you may start to have some problems that come from quitting tobacco. These problems are very bad 2-3 days after you quit, but they do not often last for more than 2-3 weeks. You may get these symptoms:  Mood swings.  Feeling restless, nervous, angry, or annoyed.  Trouble concentrating.  Dizziness.  Strong desire for high-sugar foods and nicotine.  Weight gain.  Trouble pooping (constipation).  Feeling like you may vomit (nausea).  Coughing or a sore throat.  Changes in how the medicines that you take for other issues work in your body.  Depression.  Trouble sleeping (insomnia). Week 3 and afterward After the first 2-3 weeks of quitting, you may start to notice more positive results, such as:  Better sense of smell and taste.  Less coughing and sore throat.  Slower heart rate.  Lower blood pressure.  Clearer skin.  Better breathing.  Fewer sick days. Quitting smoking can be hard. Do not give up if you fail the first time. Some people need to try a few times before they succeed. Do your best to stick to your quit plan, and talk with your doctor if you have any questions or concerns. Summary  Smoking tobacco is the leading cause of preventable death. Quitting smoking can be hard, but it is one of the best things that you can do for your health.  When you decide to quit smoking, make a plan to help you succeed.  Quit smoking right away, not slowly over a period of time.  When you start quitting, seek help from your doctor, family, or friends. This information is not intended to replace advice given to you by your health care provider. Make sure you discuss any questions you have with your health care provider. Document Revised: 08/30/2019 Document Reviewed: 02/23/2019 Elsevier Patient Education  2021 [employer]  

## 2021-03-15 NOTE — Patient Instructions (Signed)

## 2021-03-29 NOTE — Progress Notes (Deleted)
[facility] OFFICE PROGRESS NOTE  Koirala, Dibas, MD [address] [place] [address]  DIAGNOSIS: Recurrent small cell lung cancer initially diagnosed as Limited stage (T2b, N2, M0) small cell lung cancer diagnosed in November 2017 and presented with large right hilar mass with mediastinal invasion and a right lower lobe pulmonary nodule.  The patient had disease recurrence in the right lower lobe and mediastinal lymph nodes in February 2020.  PRIOR THERAPY: 1) Systemic chemotherapy with cisplatin 60 MG/M2 on day 1 and etoposide 120 MG/M2 on days 1, 2 and 3 concurrent with radiation status post 6 cycles.  Last dose was giving March 06, 2017. 2) status post prophylactic cranial irradiation. 3) palliative radiotherapy to the left hilar obstructive soft tissue mass under the care of Dr. Tammi Klippel.  CURRENT THERAPY: Systemic chemotherapy with carboplatin for AUC of 5 on day 1, etoposide.  100 mg/M2 on days 1, 2 and 3 as well as Tecentriq 1200 mg IV with Neulasta support every 3 weeks.  First dose March 05, 2019.  Status post 25 cycles.  Starting from cycle 5, the patient will be treated with single agent Tecentriq 1200 mg IV every 3 weeks.  First dose May 28, 2019.  The patient also received few cycles of immunotherapy in [place].  INTERVAL HISTORY: Tammy Bowers 70 y.o. female returns to the clinic today for a follow up visit. The patient is feeling _ today without any concerning complaints except for _. Otherwise, regarding her treatment with immunotherapy with Tecentriq, she is tolerating this well. Today, she denies any fever, chills, night sweats, or weight loss. She denies any hemoptysis. She reports her baseline dyspnea on exertion and cough. The patient continues to smoke cigarettes and is not interested in quitting. She denies any hemoptysis. She denies any vomiting, vomiting,constipation, or diarrhea but occasionally has nausea. The patient denies any rashes  or skin changes. The patient is here today for evaluation before starting cycle #26 of her treatment.  MEDICAL HISTORY: Past Medical History:  Diagnosis Date  . Adenopathy 10/10/2016   PERICARINAL  . Anemia   . Arthritis   . Asthma   . COPD (chronic obstructive pulmonary disease) ([place] [facility])   . Dyspnea   . Headache    migraines  . Hilar mass 10/10/2016   RIGHT  . History of kidney stones   . Insomnia   . Lung nodules 10/10/2016   RIGHT LOWER LOBE  . Mass of both adrenal glands ([facility]) 10/10/2016  . Mood swing   . Pneumonia   . SCL CA dx'd 2017   lung cancer Small Cell; recurrence 10/2018  . Spinal headache   . Substance abuse ([place])    TOBACCO  . UNSPECIFIED INFECTION OF BONE ANKLE AND FOOT 10/22/2009   Annotation: left ankle Qualifier: Diagnosis of  By: Patsy Baltimore RN, Denise      ALLERGIES:  is allergic to lidocaine.  MEDICATIONS:  Current Outpatient Medications  Medication Sig Dispense Refill  . albuterol (PROVENTIL HFA;VENTOLIN HFA) 108 (90 BASE) MCG/ACT inhaler Inhale 2 puffs into the lungs every 4 (four) hours as needed for wheezing.    . clonazePAM (KLONOPIN) 0.5 MG tablet     . cyclobenzaprine (FLEXERIL) 5 MG tablet Take 1 tablet by mouth 3 times/day as needed-between meals & bedtime.    . diphenoxylate-atropine (LOMOTIL) 2.5-0.025 MG tablet Take 1 tablet by mouth 4 (four) times daily as needed for diarrhea or loose stools. 30 tablet 0  . Fluticasone-Salmeterol (ADVAIR) 500-50 MCG/DOSE  AEPB Inhale 1 puff into the lungs 2 (two) times daily.    Marland Kitchen HYDROcodone-acetaminophen (HYCET) 7.5-325 mg/15 ml solution Take 10 mLs by mouth 4 (four) times daily as needed for moderate pain (use before meals and at bedtime prn). 473 mL 0  . ibuprofen (ADVIL) 800 MG tablet Take 800 mg by mouth every 8 (eight) hours as needed.    Marland Kitchen levothyroxine (SYNTHROID) 112 MCG tablet Take 1 tablet (112 mcg total) by mouth daily before breakfast. 90 tablet 1  . Multiple Vitamin (MULTIVITAMIN WITH MINERALS)  TABS tablet Take 1 tablet by mouth daily.     Marland Kitchen OVER THE COUNTER MEDICATION 1 capsule. [place] tail-"mushroom in a capsule"     . prochlorperazine (COMPAZINE) 10 MG tablet TAKE 1 TABLET(10 MG) BY MOUTH EVERY 6 HOURS AS NEEDED FOR NAUSEA OR VOMITING 30 tablet 0  . topiramate (TOPAMAX) 25 MG tablet TAKE 2 TABLETS BY MOUTH TWICE DAILY 360 tablet 1   No current facility-administered medications for this visit.    SURGICAL HISTORY:  Past Surgical History:  Procedure Laterality Date  . ANKLE SURGERY     hardware removal, and initial ankle surgery  . APPENDECTOMY    . IR IMAGING GUIDED PORT INSERTION  04/12/2019  . LUNG BIOPSY N/A 10/19/2016   Procedure: LUNG BIOPSY;  Surgeon: Grace Isaac, MD;  Location: [facility];  Service: Thoracic;  Laterality: N/A;  . LUNG BIOPSY N/A 02/20/2019   Procedure: LUNG BIOPSY;  Surgeon: Grace Isaac, MD;  Location: [facility];  Service: Thoracic;  Laterality: N/A;  . TONSILLECTOMY    . VIDEO BRONCHOSCOPY WITH ENDOBRONCHIAL ULTRASOUND N/A 10/19/2016   Procedure: VIDEO BRONCHOSCOPY WITH ENDOBRONCHIAL ULTRASOUND;  Surgeon: Grace Isaac, MD;  Location: [facility];  Service: Thoracic;  Laterality: N/A;  . VIDEO BRONCHOSCOPY WITH ENDOBRONCHIAL ULTRASOUND N/A 02/20/2019   Procedure: VIDEO BRONCHOSCOPY WITH ENDOBRONCHIAL ULTRASOUND;  Surgeon: Grace Isaac, MD;  Location: [facility];  Service: Thoracic;  Laterality: N/A;    REVIEW OF SYSTEMS:   Review of Systems  Constitutional: Negative for appetite change, chills, fatigue, fever and unexpected weight change.  HENT:   Negative for mouth sores, nosebleeds, sore throat and trouble swallowing.   Eyes: Negative for eye problems and icterus.  Respiratory: Negative for cough, hemoptysis, shortness of breath and wheezing.   Cardiovascular: Negative for chest pain and leg swelling.  Gastrointestinal: Negative for abdominal pain, constipation, diarrhea, nausea and vomiting.  Genitourinary: Negative for bladder incontinence,  difficulty urinating, dysuria, frequency and hematuria.   Musculoskeletal: Negative for back pain, gait problem, neck pain and neck stiffness.  Skin: Negative for itching and rash.  Neurological: Negative for dizziness, extremity weakness, gait problem, headaches, light-headedness and seizures.  Hematological: Negative for adenopathy. Does not bruise/bleed easily.  Psychiatric/Behavioral: Negative for confusion, depression and sleep disturbance. The patient is not nervous/anxious.     PHYSICAL EXAMINATION:  There were no vitals taken for this visit.  ECOG PERFORMANCE STATUS: {CHL ONC ECOG [id number]  Physical Exam  Constitutional: Oriented to person, place, and time and well-developed, well-nourished, and in no distress. No distress.  HENT:  Head: Normocephalic and atraumatic.  Mouth/Throat: Oropharynx is clear and moist. No oropharyngeal exudate.  Eyes: Conjunctivae are normal. Right eye exhibits no discharge. Left eye exhibits no discharge. No scleral icterus.  Neck: Normal range of motion. Neck supple.  Cardiovascular: Normal rate, regular rhythm, normal heart sounds and intact distal pulses.   Pulmonary/Chest: Effort normal and breath sounds normal. No respiratory distress. No wheezes.  No rales.  Abdominal: Soft. Bowel sounds are normal. Exhibits no distension and no mass. There is no tenderness.  Musculoskeletal: Normal range of motion. Exhibits no edema.  Lymphadenopathy:    No cervical adenopathy.  Neurological: Alert and oriented to person, place, and time. Exhibits normal muscle tone. Gait normal. Coordination normal.  Skin: Skin is warm and dry. No rash noted. Not diaphoretic. No erythema. No pallor.  Psychiatric: Mood, memory and judgment normal.  Vitals reviewed.  LABORATORY DATA: Lab Results  Component Value Date   WBC 5.7 03/15/2021   HGB 13.5 03/15/2021   HCT 40.5 03/15/2021   MCV 90.8 03/15/2021   PLT 250 03/15/2021      Chemistry      Component Value  Date/Time   NA 142 03/15/2021 0900   NA 139 11/24/2017 0913   K 3.4 (L) 03/15/2021 0900   K 3.8 11/24/2017 0913   CL 110 03/15/2021 0900   CO2 23 03/15/2021 0900   CO2 24 11/24/2017 0913   BUN 25 (H) 03/15/2021 0900   BUN 22.1 11/24/2017 0913   CREATININE 1.10 (H) 03/15/2021 0900   CREATININE 1.2 (H) 11/24/2017 0913      Component Value Date/Time   CALCIUM 8.7 (L) 03/15/2021 0900   CALCIUM 9.7 11/24/2017 0913   ALKPHOS 113 03/15/2021 0900   ALKPHOS 99 11/24/2017 0913   AST 11 (L) 03/15/2021 0900   AST 15 11/24/2017 0913   ALT 10 03/15/2021 0900   ALT 7 11/24/2017 0913   BILITOT 0.3 03/15/2021 0900   BILITOT 0.37 11/24/2017 0913       RADIOGRAPHIC STUDIES:  No results found.   ASSESSMENT/PLAN:  This is a very pleasant 70 year old Caucasian female with recurrent small cell lung cancer.She was initially diagnosed as limited stage (T2b, N2, M0)in 2017. She presented with alarge right hilar mass with mediastinal invasion and a right lower lobe pulmonary nodule.She is status post a course of systemic chemotherapy with cisplatin and etoposide for 6 cycles with concurrent radiation. She tolerated treatment well except for fatigue.  She also received prophylactic cranial irradiation. She had been on observation since March 2018.  Recent imaging studies from February 2020 showed evidence of disease recurrence within right lower lobe pulmonary nodules in addition to mediastinal lymphadenopathy. A repeat bronchoscopy with endobronchial ultrasound and biopsy confirmed recurrence of small cell lung cancer.  She is currently undergoing systemic chemotherapy with carboplatin, etoposide, and Tecentriq.She is status post25cycles.Starting from cycle #5 the patient has beenreceiving single agent maintenance Tecentriq. She has received some of her treatment in [place].  Labs were reviewed. Recommend that she _cycle #26 today scheduled.  I will arrange for a restaging CT  scan of the chest, abdomen, and pelvis prior to starting her next cycle of treatment. ***  We will see her back for follow-up visit in 3 weeks for evaluation before starting cycle #27         No orders of the defined types were placed in this encounter.    I spent {CHL ONC TIME VISIT - EPPIR:[id number]} counseling the patient face to face. The total time spent in the appointment was {CHL ONC TIME VISIT - TKZSW:[id number]}.  Emmani Lesueur L Florence Antonelli, PA-C 03/29/21

## 2021-03-31 ENCOUNTER — Telehealth: Payer: Self-pay | Admitting: Neurology

## 2021-03-31 MED ORDER — TOPIRAMATE 50 MG PO TABS: 50.0000 mg | ORAL_TABLET | Freq: Two times a day (BID) | ORAL | 1 refills | Status: DC

## 2021-03-31 NOTE — Telephone Encounter (Signed)
Pt's husband(on DPR) is asking for a call from RN to discuss the way in which his insurance will only cover pt's topiramate (TOPAMAX) 25 MG tablet. Please call

## 2021-03-31 NOTE — Telephone Encounter (Signed)
Returned call.  Husband stated that insurance company won't pay for 120 pills per month but will for 90 and is asking to have her mg/dosage changed to adjust for insurance coverage quantities.    Patient denied further questions, verbalized understanding and expressed appreciation for the phone call.

## 2021-03-31 NOTE — Telephone Encounter (Signed)
I called and left a message for the patient.  We will switch the patient to the 50 mg Topamax tablets, take 1 twice daily.

## 2021-03-31 NOTE — Addendum Note (Signed)
Addended by: Kathrynn Ducking on: 03/31/2021 02:18 PM   Modules accepted: Orders

## 2021-04-02 ENCOUNTER — Telehealth: Payer: Self-pay

## 2021-04-02 NOTE — Telephone Encounter (Signed)
Pt called stating she cannot present for her appts on Monday 4/18. Pt is aware she also has tx scheduled but say she can't come. I advised I will send a schedule message to try to get her r/s. Pt expressed understanding of this information.

## 2021-04-04 DIAGNOSIS — D582 Other hemoglobinopathies: Secondary | ICD-10-CM | POA: Diagnosis not present

## 2021-04-05 ENCOUNTER — Inpatient Hospital Stay: Payer: Medicare Other

## 2021-04-05 ENCOUNTER — Inpatient Hospital Stay: Payer: Medicare Other | Admitting: Physician Assistant

## 2021-04-06 ENCOUNTER — Telehealth: Payer: Self-pay | Admitting: Internal Medicine

## 2021-04-06 NOTE — Telephone Encounter (Signed)
Called pt to r/s appts per 4/15 sch msg. Pt told me she would like to change providers. Msg was forwarded to Pearson Grippe, Engineer, building services.

## 2021-04-07 ENCOUNTER — Telehealth: Payer: Self-pay | Admitting: Hematology and Oncology

## 2021-04-07 NOTE — Telephone Encounter (Signed)
Scheduled appt per 4/20 inbasket msg. Pt aware.

## 2021-04-15 ENCOUNTER — Telehealth: Payer: Self-pay | Admitting: Neurology

## 2021-04-15 ENCOUNTER — Encounter: Payer: Self-pay | Admitting: Neurology

## 2021-04-15 ENCOUNTER — Ambulatory Visit (INDEPENDENT_AMBULATORY_CARE_PROVIDER_SITE_OTHER): Payer: Medicare Other | Admitting: Neurology

## 2021-04-15 VITALS — BP 160/78 | HR 80 | Ht 66.0 in | Wt 203.6 lb

## 2021-04-15 DIAGNOSIS — G459 Transient cerebral ischemic attack, unspecified: Secondary | ICD-10-CM

## 2021-04-15 MED ORDER — ALPRAZOLAM 0.5 MG PO TABS: ORAL_TABLET | ORAL | 0 refills | Status: DC

## 2021-04-15 NOTE — Telephone Encounter (Signed)
Medicare/medicaid order sent to GI. No auth they will reach out to the patient to schedule.

## 2021-04-15 NOTE — Progress Notes (Signed)
Reason for visit: History of migraine headache  Tammy Bowers is an 70 y.o. female  History of present illness:  Ms. Bow is a 70 year old left-handed white female with a history of lung cancer.  She has longstanding history of migraine headaches as well.  She has had episodes relatively frequently involving a sensory spread of numbness of the left hand that gradually spreads within about a minute to include the entire arm and then goes down the left leg.  At times, she may have problems with aphasia.  The patient was to be set up for MRI of the brain but this was never done.  The patient has a history of whole brain radiation.  Recently, she has gone off of her chemotherapy agents for her lung cancer.  She denies any new numbness or weakness, she has not had any falls.  She does have 1 or 2 headaches a week.  If she remembers to take her Topamax, she prevents the left sensory events from occurring, and her husband indicates that she processes information better on the medication.  She returns for an evaluation.   Past Medical History:  Diagnosis Date  . Adenopathy 10/10/2016   PERICARINAL  . Anemia   . Arthritis   . Asthma   . COPD (chronic obstructive pulmonary disease) ([facility])   . Dyspnea   . Headache    migraines  . Hilar mass 10/10/2016   RIGHT  . History of kidney stones   . Insomnia   . Lung nodules 10/10/2016   RIGHT LOWER LOBE  . Mass of both adrenal glands ([facility]) 10/10/2016  . Mood swing   . Pneumonia   . SCL CA dx'd 2017   lung cancer Small Cell; recurrence 10/2018  . Spinal headache   . Substance abuse ([place])    TOBACCO  . UNSPECIFIED INFECTION OF BONE ANKLE AND FOOT 10/22/2009   Annotation: left ankle Qualifier: Diagnosis of  By: Gustavo Lah      Past Surgical History:  Procedure Laterality Date  . ANKLE SURGERY     hardware removal, and initial ankle surgery  . APPENDECTOMY    . IR IMAGING GUIDED PORT INSERTION  04/12/2019  . LUNG BIOPSY N/A 10/19/2016    Procedure: LUNG BIOPSY;  Surgeon: Grace Isaac, MD;  Location: [facility];  Service: Thoracic;  Laterality: N/A;  . LUNG BIOPSY N/A 02/20/2019   Procedure: LUNG BIOPSY;  Surgeon: Grace Isaac, MD;  Location: [facility];  Service: Thoracic;  Laterality: N/A;  . TONSILLECTOMY    . VIDEO BRONCHOSCOPY WITH ENDOBRONCHIAL ULTRASOUND N/A 10/19/2016   Procedure: VIDEO BRONCHOSCOPY WITH ENDOBRONCHIAL ULTRASOUND;  Surgeon: Grace Isaac, MD;  Location: [facility];  Service: Thoracic;  Laterality: N/A;  . VIDEO BRONCHOSCOPY WITH ENDOBRONCHIAL ULTRASOUND N/A 02/20/2019   Procedure: VIDEO BRONCHOSCOPY WITH ENDOBRONCHIAL ULTRASOUND;  Surgeon: Grace Isaac, MD;  [facility];  Service: Thoracic;  Laterality: N/A;    Family History  Problem Relation Age of Onset  . Heart disease Father   . Colon cancer Father   . Heart disease Paternal Grandfather   . Ovarian cancer Mother     Social history:  reports that she has been smoking cigarettes. She has a 50.00 pack-year smoking history. She has never used smokeless tobacco. She reports current alcohol use of about 7.0 standard drinks of alcohol per week. She reports previous drug use. Drug: Marijuana.    Allergies  Allergen Reactions  . Lidocaine Swelling  SWELLING REACTION UNSPECIFIED     Medications:  Prior to Admission medications   Medication Sig Start Date End Date Taking? Authorizing Provider  albuterol (PROVENTIL HFA;VENTOLIN HFA) 108 (90 BASE) MCG/ACT inhaler Inhale 2 puffs into the lungs every 4 (four) hours as needed for wheezing.   Yes [provider]  clonazePAM (KLONOPIN) 0.5 MG tablet  10/04/20  Yes [provider]  cyclobenzaprine (FLEXERIL) 5 MG tablet Take 1 tablet by mouth 3 times/day as needed-between meals & bedtime.   Yes [provider]  diphenoxylate-atropine (LOMOTIL) 2.5-0.025 MG tablet Take 1 tablet by mouth 4 (four) times daily as needed for diarrhea or loose stools. 03/30/19  Yes Wyatt Portela, MD  Fluticasone-Salmeterol (ADVAIR) 500-50 MCG/DOSE AEPB Inhale 1 puff into the lungs 2 (two) times daily.   Yes [provider]  HYDROcodone-acetaminophen (HYCET) 7.5-325 mg/15 ml solution Take 10 mLs by mouth 4 (four) times daily as needed for moderate pain (use before meals and at bedtime prn). 08/26/20  Yes Bruning, Ashlyn, PA-C  ibuprofen (ADVIL) 800 MG tablet Take 800 mg by mouth every 8 (eight) hours as needed.   Yes [provider]  levothyroxine (SYNTHROID) 112 MCG tablet Take 1 tablet (112 mcg total) by mouth daily before breakfast. 02/01/21  Yes Heilingoetter, Cassandra L, PA-C  Multiple Vitamin (MULTIVITAMIN WITH MINERALS) TABS tablet Take 1 tablet by mouth daily.    Yes [provider]  OVER THE COUNTER MEDICATION 1 capsule. [place] tail-"mushroom in a capsule"    Yes [provider]  prochlorperazine (COMPAZINE) 10 MG tablet TAKE 1 TABLET(10 MG) BY MOUTH EVERY 6 HOURS AS NEEDED FOR NAUSEA OR VOMITING 03/08/19  Yes Curt Bears, MD  topiramate (TOPAMAX) 50 MG tablet Take 1 tablet (50 mg total) by mouth 2 (two) times daily. 03/31/21  Yes Kathrynn Ducking, MD    ROS:  Out of a complete 14 system review of symptoms, the patient complains only of the following symptoms, and all other reviewed systems are negative.  Headache Left-sided sensory changes Walking difficulty  Blood pressure (!) 160/78, pulse 80, height 5\' 6"  (1.676 m), weight 203 lb 9.6 oz (92.4 kg).  Physical Exam  General: The patient is alert and cooperative at the time of the examination.  Skin: No significant peripheral edema is noted.   Neurologic Exam  Mental status: The patient is alert and oriented x 3 at the time of the examination. The patient has apparent normal recent and remote memory, with an apparently normal attention span and concentration ability.   Cranial nerves: Facial symmetry is present. Speech is normal, no aphasia or dysarthria is noted. Extraocular  movements are full. Visual fields are full.  Motor: The patient has good strength in all 4 extremities.  Sensory examination: Soft touch sensation is symmetric on the face, arms, and legs.  Coordination: The patient has good finger-nose-finger and heel-to-shin bilaterally.  Gait and station: The patient has some difficulty arising from a seated position.  Once up, she can walk independently with slightly wide-based gait.  Romberg is negative.  Reflexes: Deep tendon reflexes are symmetric.   Assessment/Plan:  1.  History of lung cancer  2.  History of whole brain radiation  3.  History of migraine headache  4.  Episodic left-sided numbness  The patient has done better with her episodes of left-sided numbness on Topamax.  That may represent migraine but the patient has had whole brain radiation with diffuse white matter changes as well.  It is  possible the patient may be having seizures.  We will check MRI of the brain with and without gadolinium enhancement, the patient is very claustrophobic, I will give Xanax for the study.  She will follow-up here in 4 months.  Jill Alexanders MD 04/15/2021 11:32 AM  [facility] [address] [place], [place] [address]  Phone [phone] Fax [phone]

## 2021-04-23 ENCOUNTER — Other Ambulatory Visit: Payer: Self-pay

## 2021-04-23 ENCOUNTER — Inpatient Hospital Stay: Payer: Medicare Other | Attending: Internal Medicine | Admitting: Hematology and Oncology

## 2021-04-23 DIAGNOSIS — F17210 Nicotine dependence, cigarettes, uncomplicated: Secondary | ICD-10-CM | POA: Insufficient documentation

## 2021-04-23 DIAGNOSIS — C3491 Malignant neoplasm of unspecified part of right bronchus or lung: Secondary | ICD-10-CM

## 2021-04-23 DIAGNOSIS — Z8041 Family history of malignant neoplasm of ovary: Secondary | ICD-10-CM | POA: Insufficient documentation

## 2021-04-23 DIAGNOSIS — Z5112 Encounter for antineoplastic immunotherapy: Secondary | ICD-10-CM | POA: Insufficient documentation

## 2021-04-23 DIAGNOSIS — Z8249 Family history of ischemic heart disease and other diseases of the circulatory system: Secondary | ICD-10-CM | POA: Insufficient documentation

## 2021-04-23 DIAGNOSIS — Z79899 Other long term (current) drug therapy: Secondary | ICD-10-CM | POA: Insufficient documentation

## 2021-04-23 DIAGNOSIS — Z7951 Long term (current) use of inhaled steroids: Secondary | ICD-10-CM | POA: Insufficient documentation

## 2021-04-23 DIAGNOSIS — J449 Chronic obstructive pulmonary disease, unspecified: Secondary | ICD-10-CM | POA: Insufficient documentation

## 2021-04-23 DIAGNOSIS — Z800 Family history of malignant neoplasm of digestive organs: Secondary | ICD-10-CM | POA: Insufficient documentation

## 2021-04-23 DIAGNOSIS — C3431 Malignant neoplasm of lower lobe, right bronchus or lung: Secondary | ICD-10-CM | POA: Insufficient documentation

## 2021-04-23 MED ORDER — SODIUM CHLORIDE 0.9% FLUSH
10.0000 mL | INTRAVENOUS | Status: DC | PRN
  Filled 2021-04-23: qty 10

## 2021-04-23 MED ORDER — HEPARIN SOD (PORK) LOCK FLUSH 100 UNIT/ML IV SOLN
500.0000 [IU] | Freq: Once | INTRAVENOUS | Status: DC
  Filled 2021-04-23: qty 5

## 2021-04-26 ENCOUNTER — Encounter: Payer: Self-pay | Admitting: Hematology and Oncology

## 2021-04-26 ENCOUNTER — Other Ambulatory Visit: Payer: Self-pay | Admitting: Physician Assistant

## 2021-04-26 ENCOUNTER — Other Ambulatory Visit: Payer: Self-pay

## 2021-04-26 ENCOUNTER — Inpatient Hospital Stay: Payer: Medicare Other | Admitting: Internal Medicine

## 2021-04-26 ENCOUNTER — Inpatient Hospital Stay: Payer: Medicare Other

## 2021-04-26 ENCOUNTER — Other Ambulatory Visit: Payer: Medicaid Other

## 2021-04-26 ENCOUNTER — Other Ambulatory Visit: Payer: Self-pay | Admitting: *Deleted

## 2021-04-26 ENCOUNTER — Inpatient Hospital Stay (HOSPITAL_BASED_OUTPATIENT_CLINIC_OR_DEPARTMENT_OTHER): Payer: Medicare Other | Admitting: Hematology and Oncology

## 2021-04-26 VITALS — BP 109/71 | HR 86 | Temp 97.8°F | Resp 13 | Ht 66.0 in | Wt 213.3 lb

## 2021-04-26 DIAGNOSIS — Z95828 Presence of other vascular implants and grafts: Secondary | ICD-10-CM

## 2021-04-26 DIAGNOSIS — C3431 Malignant neoplasm of lower lobe, right bronchus or lung: Secondary | ICD-10-CM | POA: Diagnosis not present

## 2021-04-26 DIAGNOSIS — Z8041 Family history of malignant neoplasm of ovary: Secondary | ICD-10-CM | POA: Diagnosis not present

## 2021-04-26 DIAGNOSIS — J449 Chronic obstructive pulmonary disease, unspecified: Secondary | ICD-10-CM | POA: Diagnosis not present

## 2021-04-26 DIAGNOSIS — Z800 Family history of malignant neoplasm of digestive organs: Secondary | ICD-10-CM | POA: Diagnosis not present

## 2021-04-26 DIAGNOSIS — C3401 Malignant neoplasm of right main bronchus: Secondary | ICD-10-CM | POA: Diagnosis not present

## 2021-04-26 DIAGNOSIS — E039 Hypothyroidism, unspecified: Secondary | ICD-10-CM | POA: Diagnosis not present

## 2021-04-26 DIAGNOSIS — Z8249 Family history of ischemic heart disease and other diseases of the circulatory system: Secondary | ICD-10-CM | POA: Diagnosis not present

## 2021-04-26 DIAGNOSIS — F17210 Nicotine dependence, cigarettes, uncomplicated: Secondary | ICD-10-CM | POA: Diagnosis not present

## 2021-04-26 DIAGNOSIS — C3491 Malignant neoplasm of unspecified part of right bronchus or lung: Secondary | ICD-10-CM

## 2021-04-26 DIAGNOSIS — Z79899 Other long term (current) drug therapy: Secondary | ICD-10-CM | POA: Diagnosis not present

## 2021-04-26 DIAGNOSIS — Z7951 Long term (current) use of inhaled steroids: Secondary | ICD-10-CM | POA: Diagnosis not present

## 2021-04-26 DIAGNOSIS — Z5112 Encounter for antineoplastic immunotherapy: Secondary | ICD-10-CM | POA: Diagnosis not present

## 2021-04-26 DIAGNOSIS — F321 Major depressive disorder, single episode, moderate: Secondary | ICD-10-CM | POA: Diagnosis not present

## 2021-04-26 LAB — CMP (CANCER CENTER ONLY)
ALT: 9 U/L (ref 0–44)
AST: 16 U/L (ref 15–41)
Albumin: 3.2 g/dL — ABNORMAL LOW (ref 3.5–5.0)
Alkaline Phosphatase: 120 U/L (ref 38–126)
Anion gap: 12 (ref 5–15)
BUN: 25 mg/dL — ABNORMAL HIGH (ref 8–23)
CO2: 23 mmol/L (ref 22–32)
Calcium: 9.1 mg/dL (ref 8.9–10.3)
Chloride: 109 mmol/L (ref 98–111)
Creatinine: 1.35 mg/dL — ABNORMAL HIGH (ref 0.44–1.00)
GFR, Estimated: 43 mL/min — ABNORMAL LOW (ref >60)
Glucose, Bld: 120 mg/dL — ABNORMAL HIGH (ref 70–99)
Potassium: 3.8 mmol/L (ref 3.5–5.1)
Sodium: 144 mmol/L (ref 135–145)
Total Bilirubin: 0.3 mg/dL (ref 0.3–1.2)
Total Protein: 6.8 g/dL (ref 6.5–8.1)

## 2021-04-26 LAB — CBC WITH DIFFERENTIAL/PLATELET
Abs Immature Granulocytes: 0.04 10*3/uL (ref 0.00–0.07)
Basophils Absolute: 0.1 10*3/uL (ref 0.0–0.1)
Basophils Relative: 1 %
Eosinophils Absolute: 0.1 10*3/uL (ref 0.0–0.5)
Eosinophils Relative: 2 %
HCT: 38.8 % (ref 36.0–46.0)
Hemoglobin: 12.9 g/dL (ref 12.0–15.0)
Immature Granulocytes: 1 %
Lymphocytes Relative: 11 %
Lymphs Abs: 0.8 10*3/uL (ref 0.7–4.0)
MCH: 30.4 pg (ref 26.0–34.0)
MCHC: 33.2 g/dL (ref 30.0–36.0)
MCV: 91.5 fL (ref 80.0–100.0)
Monocytes Absolute: 0.5 10*3/uL (ref 0.1–1.0)
Monocytes Relative: 7 %
Neutro Abs: 6.1 10*3/uL (ref 1.7–7.7)
Neutrophils Relative %: 79 %
Platelets: 300 10*3/uL (ref 150–400)
RBC: 4.24 MIL/uL (ref 3.87–5.11)
RDW: 12.5 % (ref 11.5–15.5)
WBC: 7.7 10*3/uL (ref 4.0–10.5)
nRBC: 0 % (ref 0.0–0.2)

## 2021-04-26 MED ORDER — SODIUM CHLORIDE 0.9 % IV SOLN
1200.0000 mg | Freq: Once | INTRAVENOUS | Status: AC
  Administered 2021-04-26: 1200 mg via INTRAVENOUS
  Filled 2021-04-26: qty 20

## 2021-04-26 MED ORDER — SODIUM CHLORIDE 0.9 % IV SOLN
Freq: Once | INTRAVENOUS | Status: AC
  Filled 2021-04-26: qty 250

## 2021-04-26 MED ORDER — SODIUM CHLORIDE 0.9% FLUSH
10.0000 mL | Freq: Once | INTRAVENOUS | Status: AC
  Administered 2021-04-26: 10 mL
  Filled 2021-04-26: qty 10

## 2021-04-26 NOTE — Patient Instructions (Signed)
[facility]  Discharge Instructions: Thank you for choosing [facility] to provide your oncology and hematology care.   If you have a lab appointment with the [facility], please go directly to the [facility] and check in at the registration area.   Wear comfortable clothing and clothing appropriate for easy access to any Portacath or PICC line.   We strive to give you quality time with your provider. You may need to reschedule your appointment if you arrive late (15 or more minutes).  Arriving late affects you and other patients whose appointments are after yours.  Also, if you miss three or more appointments without notifying the office, you may be dismissed from the clinic at the provider's discretion.      For prescription refill requests, have your pharmacy contact our office and allow 72 hours for refills to be completed.    Today you received the following chemotherapy and/or immunotherapy agents Atezolizumab   To help prevent nausea and vomiting after your treatment, we encourage you to take your nausea medication as directed.  BELOW ARE SYMPTOMS THAT SHOULD BE REPORTED IMMEDIATELY: . *FEVER GREATER THAN 100.4 F (38 C) OR HIGHER . *CHILLS OR SWEATING . *NAUSEA AND VOMITING THAT IS NOT CONTROLLED WITH YOUR NAUSEA MEDICATION . *UNUSUAL SHORTNESS OF BREATH . *UNUSUAL BRUISING OR BLEEDING . *URINARY PROBLEMS (pain or burning when urinating, or frequent urination) . *BOWEL PROBLEMS (unusual diarrhea, constipation, pain near the anus) . TENDERNESS IN MOUTH AND THROAT WITH OR WITHOUT PRESENCE OF ULCERS (sore throat, sores in mouth, or a toothache) . UNUSUAL RASH, SWELLING OR PAIN  . UNUSUAL VAGINAL DISCHARGE OR ITCHING   Items with * indicate a potential emergency and should be followed up as soon as possible or go to the Emergency Department if any problems should occur.  Please show the CHEMOTHERAPY ALERT CARD or IMMUNOTHERAPY ALERT  CARD at check-in to the Emergency Department and triage nurse.  Should you have questions after your visit or need to cancel or reschedule your appointment, please contact [facility]  Dept: [phone]  and follow the prompts.  Office hours are 8:00 a.m. to 4:30 p.m. Monday - Friday. Please note that voicemails left after 4:00 p.m. may not be returned until the following business day.  We are closed weekends and major holidays. You have access to a nurse at all times for urgent questions. Please call the main number to the clinic Dept: [phone] and follow the prompts.   For any non-urgent questions, you may also contact your provider using MyChart. We now offer e-Visits for anyone 38 and older to request care online for non-urgent symptoms. For details visit [email or web address].   Also download the MyChart app! Go to the app store, search "MyChart", open the app, select [facility], and log in with your MyChart username and password.  Due to Covid, a mask is required upon entering the hospital/clinic. If you do not have a mask, one will be given to you upon arrival. For doctor visits, patients may have 1 support person aged 53 or older with them. For treatment visits, patients cannot have anyone with them due to current Covid guidelines and our immunocompromised population.

## 2021-04-26 NOTE — Progress Notes (Signed)
[facility] [phone]   [phone]  PROGRESS NOTE  Patient Care Team: Lujean Amel, MD as PCP - General (Family Medicine) Curt Bears, MD as Consulting Physician (Oncology)  Hematological/Oncological History # Small Cell Lung Cancer, Limited stage (T2b, N2, M0) 03/06/2017: completed Carbo/Etop with radiation x 6 cycles 03/05/2019: started Carbo/Etop/Atezo for recurrence with Dr. Julien Nordmann. 04/26/2021: transfer care to Dr. Lorenso Courier. Cycle 26 of atezolizumab.   Interval History:  Tammy Bowers 70 y.o. female with medical history significant for limited stage Small Cell Lung cancer who presents for a follow up visit. The patient's last visit was on 03/15/2021 with Dr. Julien Nordmann. In the interim since the last visit she has requested transfer to a new provider.   On exam today Tammy Bowers is accompanied by her husband.  She reports that she "does not wish to continue immunotherapy".  She reports that she "does not do well with it".  And that she does not think right while taking this medication.  She reports that she was recently talking to a young man about his dog and rather than saying that the dog was "the pick of the litter" she said it was the "lick of the clitter".  She reports mental fogging like this is quite common and as result she would like to discontinue her immunotherapy.  She reports otherwise she is at her baseline level health with no fevers, chills, sweats, nausea, vomiting or diarrhea.  Her shortness of breath is at baseline.  A full 10 point ROS is listed below.  The bulk of our discussion today focused on the diagnosis of small cell lung cancer and treatment moving forward.  MEDICAL HISTORY:  Past Medical History:  Diagnosis Date  . Adenopathy 10/10/2016   PERICARINAL  . Anemia   . Arthritis   . Asthma   . COPD (chronic obstructive pulmonary disease) ([place])   . Dyspnea   . Headache    migraines  . Hilar mass 10/10/2016   RIGHT  .  History of kidney stones   . Insomnia   . Lung nodules 10/10/2016   RIGHT LOWER LOBE  . Mass of both adrenal glands ([place]) 10/10/2016  . Mood swing   . Pneumonia   . SCL CA dx'd 2017   lung cancer Small Cell; recurrence 10/2018  . Spinal headache   . Substance abuse ([place])    TOBACCO  . UNSPECIFIED INFECTION OF BONE ANKLE AND FOOT 10/22/2009   Annotation: left ankle Qualifier: Diagnosis of  By: Gustavo Lah      SURGICAL HISTORY: Past Surgical History:  Procedure Laterality Date  . ANKLE SURGERY     hardware removal, and initial ankle surgery  . APPENDECTOMY    . IR IMAGING GUIDED PORT INSERTION  04/12/2019  . LUNG BIOPSY N/A 10/19/2016   Procedure: LUNG BIOPSY;  Surgeon: Grace Isaac, MD;  Location: [facility];  Service: Thoracic;  Laterality: N/A;  . LUNG BIOPSY N/A 02/20/2019   Procedure: LUNG BIOPSY;  Surgeon: Grace Isaac, MD;  Location: [facility];  Service: Thoracic;  Laterality: N/A;  . TONSILLECTOMY    . VIDEO BRONCHOSCOPY WITH ENDOBRONCHIAL ULTRASOUND N/A 10/19/2016   Procedure: VIDEO BRONCHOSCOPY WITH ENDOBRONCHIAL ULTRASOUND;  Surgeon: Grace Isaac, MD;  Location: [facility];  Service: Thoracic;  Laterality: N/A;  . VIDEO BRONCHOSCOPY WITH ENDOBRONCHIAL ULTRASOUND N/A 02/20/2019   Procedure: VIDEO BRONCHOSCOPY WITH ENDOBRONCHIAL ULTRASOUND;  Surgeon: Grace Isaac, MD;  Location: [facility];  Service: Thoracic;  Laterality: N/A;  SOCIAL HISTORY: Social History   Socioeconomic History  . Marital status: Married    Spouse name: Tammy Bowers  . Number of children: 2  . Years of education: Not on file  . Highest education level: Not on file  Occupational History  . Occupation: retired  Tobacco Use  . Smoking status: Current Every Day Smoker    Packs/day: 1.00    Years: 50.00    Pack years: 50.00    Types: Cigarettes  . Smokeless tobacco: Never Used  Vaping Use  . Vaping Use: Never used  Substance and Sexual Activity  . Alcohol use: Yes    Alcohol/week: 7.0  standard drinks    Types: 7 Shots of liquor per week  . Drug use: Not Currently    Types: Marijuana    Comment: August/Sept 2017 last ttime  . Sexual activity: Yes    Partners: Male    Birth control/protection: Post-menopausal  Other Topics Concern  . Not on file  Social History Narrative   Lives with husband Ryker Pherigo   Left Handed   Drinks 3-4 cups daily   Social Determinants of Health   Financial Resource Strain: Not on file  Food Insecurity: Not on file  Transportation Needs: Not on file  Physical Activity: Not on file  Stress: Not on file  Social Connections: Not on file  Intimate Partner Violence: Not on file    FAMILY HISTORY: Family History  Problem Relation Age of Onset  . Heart disease Father   . Colon cancer Father   . Heart disease Paternal Grandfather   . Ovarian cancer Mother     ALLERGIES:  is allergic to lidocaine.  MEDICATIONS:  Current Outpatient Medications  Medication Sig Dispense Refill  . albuterol (PROVENTIL HFA;VENTOLIN HFA) 108 (90 BASE) MCG/ACT inhaler Inhale 2 puffs into the lungs every 4 (four) hours as needed for wheezing.    Marland Kitchen ALPRAZolam (XANAX) 0.5 MG tablet Take 2 tablets approximately 45 minutes prior to the MRI study, take a third tablet if needed. 3 tablet 0  . clonazePAM (KLONOPIN) 0.5 MG tablet     . cyclobenzaprine (FLEXERIL) 5 MG tablet Take 1 tablet by mouth 3 times/day as needed-between meals & bedtime.    . diphenoxylate-atropine (LOMOTIL) 2.5-0.025 MG tablet Take 1 tablet by mouth 4 (four) times daily as needed for diarrhea or loose stools. 30 tablet 0  . Fluticasone-Salmeterol (ADVAIR) 500-50 MCG/DOSE AEPB Inhale 1 puff into the lungs 2 (two) times daily.    Marland Kitchen HYDROcodone-acetaminophen (HYCET) 7.5-325 mg/15 ml solution Take 10 mLs by mouth 4 (four) times daily as needed for moderate pain (use before meals and at bedtime prn). 473 mL 0  . ibuprofen (ADVIL) 800 MG tablet Take 800 mg by mouth every 8 (eight) hours as needed.    Marland Kitchen  levothyroxine (SYNTHROID) 112 MCG tablet Take 1 tablet (112 mcg total) by mouth daily before breakfast. 90 tablet 1  . Multiple Vitamin (MULTIVITAMIN WITH MINERALS) TABS tablet Take 1 tablet by mouth daily.     Marland Kitchen OVER THE COUNTER MEDICATION 1 capsule. [place] tail-"mushroom in a capsule"     . prochlorperazine (COMPAZINE) 10 MG tablet TAKE 1 TABLET(10 MG) BY MOUTH EVERY 6 HOURS AS NEEDED FOR NAUSEA OR VOMITING 30 tablet 0  . topiramate (TOPAMAX) 50 MG tablet Take 1 tablet (50 mg total) by mouth 2 (two) times daily. 180 tablet 1   No current facility-administered medications for this visit.    REVIEW OF SYSTEMS:   Constitutional: ( - )  fevers, ( - )  chills , ( - ) night sweats Eyes: ( - ) blurriness of vision, ( - ) double vision, ( - ) watery eyes Ears, nose, mouth, throat, and face: ( - ) mucositis, ( - ) sore throat Respiratory: ( - ) cough, ( - ) dyspnea, ( - ) wheezes Cardiovascular: ( - ) palpitation, ( - ) chest discomfort, ( - ) lower extremity swelling Gastrointestinal:  ( - ) nausea, ( - ) heartburn, ( - ) change in bowel habits Skin: ( - ) abnormal skin rashes Lymphatics: ( - ) new lymphadenopathy, ( - ) easy bruising Neurological: ( - ) numbness, ( - ) tingling, ( - ) new weaknesses Behavioral/Psych: ( - ) mood change, ( - ) new changes  All other systems were reviewed with the patient and are negative.  PHYSICAL EXAMINATION: ECOG PERFORMANCE STATUS: 2 - Symptomatic, <50% confined to bed  Vitals:   04/26/21 1341  BP: 109/71  Pulse: 86  Resp: 13  Temp: 97.8 F (36.6 C)  SpO2: 100%   Filed Weights   04/26/21 1341  Weight: 213 lb 4.8 oz (96.8 kg)    GENERAL: Chronically ill-appearing elderly Caucasian female alert, no distress and comfortable SKIN: skin color, texture, turgor are normal, no rashes or significant lesions EYES: conjunctiva are pink and non-injected, sclera clear LUNGS: clear to auscultation and percussion with normal breathing effort HEART: regular  rate & rhythm and no murmurs and no lower extremity edema Musculoskeletal: no cyanosis of digits and no clubbing  PSYCH: alert & oriented x 3, fluent speech NEURO: no focal motor/sensory deficits  LABORATORY DATA:  I have reviewed the data as listed CBC Latest Ref Rng & Units 03/15/2021 02/22/2021 02/01/2021  WBC 4.0 - 10.5 K/uL 5.7 5.9 6.0  Hemoglobin 12.0 - 15.0 g/dL 13.5 13.5 13.4  Hematocrit 36.0 - 46.0 % 40.5 41.0 39.0  Platelets 150 - 400 K/uL 250 294 252    CMP Latest Ref Rng & Units 03/15/2021 02/22/2021 02/01/2021  Glucose 70 - 99 mg/dL 75 79 76  BUN 8 - 23 mg/dL 25(H) 23 25(H)  Creatinine 0.44 - 1.00 mg/dL 1.10(H) 1.26(H) 1.26(H)  Sodium 135 - 145 mmol/L 142 139 138  Potassium 3.5 - 5.1 mmol/L 3.4(L) 3.7 3.8  Chloride 98 - 111 mmol/L 110 109 111  CO2 22 - 32 mmol/L 23 23 21(L)  Calcium 8.9 - 10.3 mg/dL 8.7(L) 9.1 8.8(L)  Total Protein 6.5 - 8.1 g/dL 6.8 7.1 6.9  Total Bilirubin 0.3 - 1.2 mg/dL 0.3 0.3 0.2(L)  Alkaline Phos 38 - 126 U/L 113 119 110  AST 15 - 41 U/L 11(L) 15 13(L)  ALT 0 - 44 U/L 10 10 7     RADIOGRAPHIC STUDIES: No results found.  ASSESSMENT & PLAN Merrie Epler 70 y.o. female with medical history significant for limited stage Small Cell Lung cancer who presents for a follow up visit.  After review the labs, reviewed records, schedule patient the findings most consistent with a limited stage small cell lung cancer.  She is currently on therapy with atezolizumab and had recently requested to transition care from Dr. Julien Nordmann to myself.  We are happy to assume the care of this patient.  Today we focused on the diagnosis of small cell lung cancer and the treatment of atezolizumab.  We explained the mechanism of action to the patient and her husband.  The patient notes that she is having some mental fogging for which she would like to  discontinue this medication.  Strongly encourage her to reconsider and I believe that this is what is keeping the tumor from  metastasizing and growing.  She is in agreement is willing and able to continue treatment today.  #Small Cell Lung Cancer, Recurrent Limited Stage --Patient is questioning whether or not she would like to continue atezolizumab monotherapy.  Discussed with her and her husband the risks and benefits of this therapy and the likelihood that this is what is keeping her alive.  She was agreeable to continue therapy at this time. --MRI brain scheduled for 05/04/2021 --last CT C/A/P on 02/19/2021. Next due in June 2022 --continue atezolizumab maintenance therapy. She is currently Cycle 26.  --RTC in 3 weeks with next cycle of chemotherapy.   No orders of the defined types were placed in this encounter.  All questions were answered. The patient knows to call the clinic with any problems, questions or concerns.  A total of more than 40 minutes were spent on this encounter and over half of that time was spent on counseling and coordination of care as outlined above.   Ledell Peoples, MD Department of Hematology/Oncology [facility] Phone: [phone] Pager: [phone] Email: [email or web address]  04/26/2021 1:49 PM

## 2021-04-26 NOTE — Patient Instructions (Signed)
Implanted Port Insertion, Care After This sheet gives you information about how to care for yourself after your procedure. Your health care provider may also give you more specific instructions. If you have problems or questions, contact your health care provider. What can I expect after the procedure? After the procedure, it is common to have:  Discomfort at the port insertion site.  Bruising on the skin over the port. This should improve over 3-4 days. Follow these instructions at home: Port care  After your port is placed, you will get a manufacturer's information card. The card has information about your port. Keep this card with you at all times.  Take care of the port as told by your health care provider. Ask your health care provider if you or a family member can get training for taking care of the port at home. A home health care nurse may also take care of the port.  Make sure to remember what type of port you have. Incision care  Follow instructions from your health care provider about how to take care of your port insertion site. Make sure you: ? Wash your hands with soap and water before and after you change your bandage (dressing). If soap and water are not available, use hand sanitizer. ? Change your dressing as told by your health care provider. ? Leave stitches (sutures), skin glue, or adhesive strips in place. These skin closures may need to stay in place for 2 weeks or longer. If adhesive strip edges start to loosen and curl up, you may trim the loose edges. Do not remove adhesive strips completely unless your health care provider tells you to do that.  Check your port insertion site every day for signs of infection. Check for: ? Redness, swelling, or pain. ? Fluid or blood. ? Warmth. ? Pus or a bad smell.      Activity  Return to your normal activities as told by your health care provider. Ask your health care provider what activities are safe for you.  Do not  lift anything that is heavier than 10 lb (4.5 kg), or the limit that you are told, until your health care provider says that it is safe. General instructions  Take over-the-counter and prescription medicines only as told by your health care provider.  Do not take baths, swim, or use a hot tub until your health care provider approves. Ask your health care provider if you may take showers. You may only be allowed to take sponge baths.  Do not drive for 24 hours if you were given a sedative during your procedure.  Wear a medical alert bracelet in case of an emergency. This will tell any health care providers that you have a port.  Keep all follow-up visits as told by your health care provider. This is important. Contact a health care provider if:  You cannot flush your port with saline as directed, or you cannot draw blood from the port.  You have a fever or chills.  You have redness, swelling, or pain around your port insertion site.  You have fluid or blood coming from your port insertion site.  Your port insertion site feels warm to the touch.  You have pus or a bad smell coming from the port insertion site. Get help right away if:  You have chest pain or shortness of breath.  You have bleeding from your port that you cannot control. Summary  Take care of the port as told by your   health care provider. Keep the manufacturer's information card with you at all times.  Change your dressing as told by your health care provider.  Contact a health care provider if you have a fever or chills or if you have redness, swelling, or pain around your port insertion site.  Keep all follow-up visits as told by your health care provider. This information is not intended to replace advice given to you by your health care provider. Make sure you discuss any questions you have with your health care provider. Document Revised: 07/03/2018 Document Reviewed: 07/03/2018 Elsevier Patient Education   2021 Elsevier Inc.  

## 2021-04-27 ENCOUNTER — Telehealth: Payer: Self-pay | Admitting: Emergency Medicine

## 2021-04-27 NOTE — Telephone Encounter (Signed)
Orders for port access and de-access signed and faxed.  OK transmission received

## 2021-04-28 ENCOUNTER — Telehealth: Payer: Self-pay | Admitting: Hematology and Oncology

## 2021-04-28 NOTE — Telephone Encounter (Signed)
Scheduled per los. Called and left msg. Mailed printout  °

## 2021-05-04 ENCOUNTER — Other Ambulatory Visit: Payer: Self-pay

## 2021-05-04 ENCOUNTER — Ambulatory Visit
Admission: RE | Admit: 2021-05-04 | Discharge: 2021-05-04 | Disposition: A | Discharge status: Home / Self Care | Payer: Medicare Other | Source: Ambulatory Visit | Attending: Neurology | Admitting: Neurology

## 2021-05-04 DIAGNOSIS — Z95828 Presence of other vascular implants and grafts: Secondary | ICD-10-CM

## 2021-05-04 DIAGNOSIS — G459 Transient cerebral ischemic attack, unspecified: Secondary | ICD-10-CM

## 2021-05-04 MED ORDER — HEPARIN SOD (PORK) LOCK FLUSH 100 UNIT/ML IV SOLN
500.0000 [IU] | Freq: Once | INTRAVENOUS | Status: AC
  Administered 2021-05-04: 500 [IU] via INTRAVENOUS

## 2021-05-04 MED ORDER — SODIUM CHLORIDE 0.9% FLUSH
10.0000 mL | INTRAVENOUS | Status: DC | PRN
  Administered 2021-05-04: 10 mL via INTRAVENOUS

## 2021-05-04 MED ORDER — GADOBENATE DIMEGLUMINE 529 MG/ML IV SOLN
20.0000 mL | Freq: Once | INTRAVENOUS | Status: AC | PRN
  Administered 2021-05-04: 20 mL via INTRAVENOUS

## 2021-05-05 ENCOUNTER — Other Ambulatory Visit: Payer: Self-pay | Admitting: Physician Assistant

## 2021-05-05 ENCOUNTER — Telehealth: Payer: Self-pay | Admitting: Neurology

## 2021-05-05 DIAGNOSIS — R5382 Chronic fatigue, unspecified: Secondary | ICD-10-CM

## 2021-05-05 NOTE — Telephone Encounter (Signed)
  I called the patient.  MRI of the brain shows some slight progression of the cerebrovascular disease associated with whole brain radiation.  No evidence of metastasis.  There is a venous abnormality in the right brain, not clear that this is the source of her current symptoms.  We will follow the MRI over time.  The patient is on Topamax.  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  Metastasis would be unlikely based on imaging characteristics.  There are advanced changes of subcortical and brainstem microvascular disease likely due to effects of prior brain radiation.  Compared to previous MRI from 2020 the enhancing lesion in the right frontal subcortical region appears to be new and white matter changes appear slightly more advanced.

## 2021-05-18 ENCOUNTER — Ambulatory Visit: Payer: Medicaid Other | Admitting: Internal Medicine

## 2021-05-18 ENCOUNTER — Other Ambulatory Visit: Payer: Medicaid Other

## 2021-05-18 ENCOUNTER — Other Ambulatory Visit: Payer: Self-pay | Admitting: Hematology and Oncology

## 2021-05-18 ENCOUNTER — Inpatient Hospital Stay (HOSPITAL_BASED_OUTPATIENT_CLINIC_OR_DEPARTMENT_OTHER): Payer: Medicare Other | Admitting: Hematology and Oncology

## 2021-05-18 ENCOUNTER — Ambulatory Visit: Payer: Medicaid Other

## 2021-05-18 ENCOUNTER — Encounter: Payer: Self-pay | Admitting: Hematology and Oncology

## 2021-05-18 ENCOUNTER — Inpatient Hospital Stay: Payer: Medicare Other

## 2021-05-18 ENCOUNTER — Other Ambulatory Visit: Payer: Self-pay

## 2021-05-18 VITALS — BP 88/57 | HR 102 | Temp 97.6°F | Resp 18 | Ht 66.0 in | Wt 207.8 lb

## 2021-05-18 VITALS — BP 107/78 | HR 93

## 2021-05-18 DIAGNOSIS — C3490 Malignant neoplasm of unspecified part of unspecified bronchus or lung: Secondary | ICD-10-CM

## 2021-05-18 DIAGNOSIS — E039 Hypothyroidism, unspecified: Secondary | ICD-10-CM

## 2021-05-18 DIAGNOSIS — C3491 Malignant neoplasm of unspecified part of right bronchus or lung: Secondary | ICD-10-CM

## 2021-05-18 DIAGNOSIS — J449 Chronic obstructive pulmonary disease, unspecified: Secondary | ICD-10-CM | POA: Diagnosis not present

## 2021-05-18 DIAGNOSIS — C3431 Malignant neoplasm of lower lobe, right bronchus or lung: Secondary | ICD-10-CM | POA: Diagnosis not present

## 2021-05-18 DIAGNOSIS — Z95828 Presence of other vascular implants and grafts: Secondary | ICD-10-CM

## 2021-05-18 DIAGNOSIS — Z7951 Long term (current) use of inhaled steroids: Secondary | ICD-10-CM | POA: Diagnosis not present

## 2021-05-18 DIAGNOSIS — Z79899 Other long term (current) drug therapy: Secondary | ICD-10-CM | POA: Diagnosis not present

## 2021-05-18 DIAGNOSIS — Z5112 Encounter for antineoplastic immunotherapy: Secondary | ICD-10-CM

## 2021-05-18 DIAGNOSIS — F17210 Nicotine dependence, cigarettes, uncomplicated: Secondary | ICD-10-CM | POA: Diagnosis not present

## 2021-05-18 LAB — CBC WITH DIFFERENTIAL (CANCER CENTER ONLY)
Abs Immature Granulocytes: 0.02 10*3/uL (ref 0.00–0.07)
Basophils Absolute: 0.1 10*3/uL (ref 0.0–0.1)
Basophils Relative: 1 %
Eosinophils Absolute: 0.2 10*3/uL (ref 0.0–0.5)
Eosinophils Relative: 2 %
HCT: 39.2 % (ref 36.0–46.0)
Hemoglobin: 13.1 g/dL (ref 12.0–15.0)
Immature Granulocytes: 0 %
Lymphocytes Relative: 10 %
Lymphs Abs: 0.7 10*3/uL (ref 0.7–4.0)
MCH: 30 pg (ref 26.0–34.0)
MCHC: 33.4 g/dL (ref 30.0–36.0)
MCV: 89.9 fL (ref 80.0–100.0)
Monocytes Absolute: 0.5 10*3/uL (ref 0.1–1.0)
Monocytes Relative: 7 %
Neutro Abs: 5.8 10*3/uL (ref 1.7–7.7)
Neutrophils Relative %: 80 %
Platelet Count: 272 10*3/uL (ref 150–400)
RBC: 4.36 MIL/uL (ref 3.87–5.11)
RDW: 12.3 % (ref 11.5–15.5)
WBC Count: 7.2 10*3/uL (ref 4.0–10.5)
nRBC: 0 % (ref 0.0–0.2)

## 2021-05-18 LAB — CMP (CANCER CENTER ONLY)
ALT: 8 U/L (ref 0–44)
AST: 15 U/L (ref 15–41)
Albumin: 3.2 g/dL — ABNORMAL LOW (ref 3.5–5.0)
Alkaline Phosphatase: 101 U/L (ref 38–126)
Anion gap: 9 (ref 5–15)
BUN: 20 mg/dL (ref 8–23)
CO2: 23 mmol/L (ref 22–32)
Calcium: 9.4 mg/dL (ref 8.9–10.3)
Chloride: 110 mmol/L (ref 98–111)
Creatinine: 1.15 mg/dL — ABNORMAL HIGH (ref 0.44–1.00)
GFR, Estimated: 52 mL/min — ABNORMAL LOW (ref >60)
Glucose, Bld: 122 mg/dL — ABNORMAL HIGH (ref 70–99)
Potassium: 3.5 mmol/L (ref 3.5–5.1)
Sodium: 142 mmol/L (ref 135–145)
Total Bilirubin: 0.3 mg/dL (ref 0.3–1.2)
Total Protein: 6.5 g/dL (ref 6.5–8.1)

## 2021-05-18 LAB — TSH: TSH: 0.134 u[IU]/mL — ABNORMAL LOW (ref 0.308–3.960)

## 2021-05-18 MED ORDER — SODIUM CHLORIDE 0.9 % IV SOLN
Freq: Once | INTRAVENOUS | Status: AC
  Filled 2021-05-18: qty 250

## 2021-05-18 MED ORDER — SODIUM CHLORIDE 0.9% FLUSH
10.0000 mL | Freq: Once | INTRAVENOUS | Status: AC
  Administered 2021-05-18: 10 mL
  Filled 2021-05-18: qty 10

## 2021-05-18 MED ORDER — SODIUM CHLORIDE 0.9 % IV SOLN
1200.0000 mg | Freq: Once | INTRAVENOUS | Status: AC
  Administered 2021-05-18: 1200 mg via INTRAVENOUS
  Filled 2021-05-18: qty 20

## 2021-05-18 MED ORDER — SODIUM CHLORIDE 0.9% FLUSH
10.0000 mL | INTRAVENOUS | Status: DC | PRN
  Administered 2021-05-18: 10 mL
  Filled 2021-05-18: qty 10

## 2021-05-18 MED ORDER — HEPARIN SOD (PORK) LOCK FLUSH 100 UNIT/ML IV SOLN
500.0000 [IU] | Freq: Once | INTRAVENOUS | Status: AC | PRN
  Administered 2021-05-18: 500 [IU]
  Filled 2021-05-18: qty 5

## 2021-05-18 NOTE — Patient Instructions (Signed)
[facility]  Discharge Instructions: Thank you for choosing [facility] to provide your oncology and hematology care.   If you have a lab appointment with the [facility], please go directly to the [facility] and check in at the registration area.   Wear comfortable clothing and clothing appropriate for easy access to any Portacath or PICC line.   We strive to give you quality time with your provider. You may need to reschedule your appointment if you arrive late (15 or more minutes).  Arriving late affects you and other patients whose appointments are after yours.  Also, if you miss three or more appointments without notifying the office, you may be dismissed from the clinic at the provider's discretion.      For prescription refill requests, have your pharmacy contact our office and allow 72 hours for refills to be completed.    Today you received the following chemotherapy and/or immunotherapy agents Atezolizumab   To help prevent nausea and vomiting after your treatment, we encourage you to take your nausea medication as directed.  BELOW ARE SYMPTOMS THAT SHOULD BE REPORTED IMMEDIATELY: . *FEVER GREATER THAN 100.4 F (38 C) OR HIGHER . *CHILLS OR SWEATING . *NAUSEA AND VOMITING THAT IS NOT CONTROLLED WITH YOUR NAUSEA MEDICATION . *UNUSUAL SHORTNESS OF BREATH . *UNUSUAL BRUISING OR BLEEDING . *URINARY PROBLEMS (pain or burning when urinating, or frequent urination) . *BOWEL PROBLEMS (unusual diarrhea, constipation, pain near the anus) . TENDERNESS IN MOUTH AND THROAT WITH OR WITHOUT PRESENCE OF ULCERS (sore throat, sores in mouth, or a toothache) . UNUSUAL RASH, SWELLING OR PAIN  . UNUSUAL VAGINAL DISCHARGE OR ITCHING   Items with * indicate a potential emergency and should be followed up as soon as possible or go to the Emergency Department if any problems should occur.  Please show the CHEMOTHERAPY ALERT CARD or IMMUNOTHERAPY ALERT  CARD at check-in to the Emergency Department and triage nurse.  Should you have questions after your visit or need to cancel or reschedule your appointment, please contact [facility]  Dept: [phone]  and follow the prompts.  Office hours are 8:00 a.m. to 4:30 p.m. Monday - Friday. Please note that voicemails left after 4:00 p.m. may not be returned until the following business day.  We are closed weekends and major holidays. You have access to a nurse at all times for urgent questions. Please call the main number to the clinic Dept: [phone] and follow the prompts.   For any non-urgent questions, you may also contact your provider using MyChart. We now offer e-Visits for anyone 30 and older to request care online for non-urgent symptoms. For details visit [email or web address].   Also download the MyChart app! Go to the app store, search "MyChart", open the app, select [facility], and log in with your MyChart username and password.  Due to Covid, a mask is required upon entering the hospital/clinic. If you do not have a mask, one will be given to you upon arrival. For doctor visits, patients may have 1 support person aged 61 or older with them. For treatment visits, patients cannot have anyone with them due to current Covid guidelines and our immunocompromised population.

## 2021-05-18 NOTE — Progress Notes (Signed)
[facility] [phone]   [phone]  PROGRESS NOTE  Patient Care Team: Lujean Amel, MD as PCP - General (Family Medicine) Curt Bears, MD as Consulting Physician (Oncology)  Hematological/Oncological History # Small Cell Lung Cancer, Limited stage (T2b, N2, M0) 03/06/2017: completed Carbo/Etop with radiation x 6 cycles 03/05/2019: started Carbo/Etop/Atezo for recurrence with Dr. Julien Nordmann. 04/26/2021: transfer care to Dr. Lorenso Courier. Cycle 26 of atezolizumab.  05/18/2021: Cycle 27 of atezolizumab maintenance therapy  Interval History:  Tammy Bowers 70 y.o. female with medical history significant for limited stage Small Cell Lung cancer who presents for a follow up visit. The patient's last visit was on 04/26/2021 at which time she established care with Dr. Lorenso Courier. In the interim since the last visit she has continued on atezolizumab maintenance therapy.   On exam today Tammy Bowers reports that she has been well in the interim since her last visit.  She reports that she has recently started taking Zoloft and so far she "does not like it".  She has that is causing some restlessness at night and she is having trouble getting to sleep.  She notes she tosses and turns and then planning for sleep around 6 AM with a wake up a few hours later.  She repeat this pattern nightly.  She has that her appetite and everything else is pretty much been the same and there are no other big changes in her health.  Skills show that she has lost proximately 6 pounds since her last visit.  She notes that her brain fog has been stable with no major changes.  She denies any fevers, chills, sweats, nausea, vomiting or diarrhea.  Her shortness of breath is at baseline.  A full 10 point ROS is listed below.   MEDICAL HISTORY:  Past Medical History:  Diagnosis Date  . Adenopathy 10/10/2016   PERICARINAL  . Anemia   . Arthritis   . Asthma   . COPD (chronic obstructive pulmonary  disease) ([place])   . Dyspnea   . Headache    migraines  . Hilar mass 10/10/2016   RIGHT  . History of kidney stones   . Insomnia   . Lung nodules 10/10/2016   RIGHT LOWER LOBE  . Mass of both adrenal glands ([place]) 10/10/2016  . Mood swing   . Pneumonia   . SCL CA dx'd 2017   lung cancer Small Cell; recurrence 10/2018  . Spinal headache   . Substance abuse ([place])    TOBACCO  . UNSPECIFIED INFECTION OF BONE ANKLE AND FOOT 10/22/2009   Annotation: left ankle Qualifier: Diagnosis of  By: Gustavo Lah      SURGICAL HISTORY: Past Surgical History:  Procedure Laterality Date  . ANKLE SURGERY     hardware removal, and initial ankle surgery  . APPENDECTOMY    . IR IMAGING GUIDED PORT INSERTION  04/12/2019  . LUNG BIOPSY N/A 10/19/2016   Procedure: LUNG BIOPSY;  Surgeon: Grace Isaac, MD;  Location: [facility];  Service: Thoracic;  Laterality: N/A;  . LUNG BIOPSY N/A 02/20/2019   Procedure: LUNG BIOPSY;  Surgeon: Grace Isaac, MD;  Location: [facility];  Service: Thoracic;  Laterality: N/A;  . TONSILLECTOMY    . VIDEO BRONCHOSCOPY WITH ENDOBRONCHIAL ULTRASOUND N/A 10/19/2016   Procedure: VIDEO BRONCHOSCOPY WITH ENDOBRONCHIAL ULTRASOUND;  Surgeon: Grace Isaac, MD;  Location: [facility];  Service: Thoracic;  Laterality: N/A;  . VIDEO BRONCHOSCOPY WITH ENDOBRONCHIAL ULTRASOUND N/A 02/20/2019   Procedure: VIDEO BRONCHOSCOPY  WITH ENDOBRONCHIAL ULTRASOUND;  Surgeon: Grace Isaac, MD;  Location: [facility] OR;  Service: Thoracic;  Laterality: N/A;    SOCIAL HISTORY: Social History   Socioeconomic History  . Marital status: Married    Spouse name: Jenny Reichmann  . Number of children: 2  . Years of education: Not on file  . Highest education level: Not on file  Occupational History  . Occupation: retired  Tobacco Use  . Smoking status: Current Every Day Smoker    Packs/day: 1.00    Years: 50.00    Pack years: 50.00    Types: Cigarettes  . Smokeless tobacco: Never Used  Vaping Use  . Vaping  Use: Never used  Substance and Sexual Activity  . Alcohol use: Yes    Alcohol/week: 7.0 standard drinks    Types: 7 Shots of liquor per week  . Drug use: Not Currently    Types: Marijuana    Comment: August/Sept 2017 last ttime  . Sexual activity: Yes    Partners: Male    Birth control/protection: Post-menopausal  Other Topics Concern  . Not on file  Social History Narrative   Lives with husband Donnice Nielsen   Left Handed   Drinks 3-4 cups daily   Social Determinants of Health   Financial Resource Strain: Not on file  Food Insecurity: Not on file  Transportation Needs: Not on file  Physical Activity: Not on file  Stress: Not on file  Social Connections: Not on file  Intimate Partner Violence: Not on file    FAMILY HISTORY: Family History  Problem Relation Age of Onset  . Heart disease Father   . Colon cancer Father   . Heart disease Paternal Grandfather   . Ovarian cancer Mother     ALLERGIES:  is allergic to lidocaine.  MEDICATIONS:  Current Outpatient Medications  Medication Sig Dispense Refill  . albuterol (PROVENTIL HFA;VENTOLIN HFA) 108 (90 BASE) MCG/ACT inhaler Inhale 2 puffs into the lungs every 4 (four) hours as needed for wheezing.    Marland Kitchen ALPRAZolam (XANAX) 0.5 MG tablet Take 2 tablets approximately 45 minutes prior to the MRI study, take a third tablet if needed. 3 tablet 0  . clonazePAM (KLONOPIN) 0.5 MG tablet     . cyclobenzaprine (FLEXERIL) 5 MG tablet Take 1 tablet by mouth 3 times/day as needed-between meals & bedtime.    . diphenoxylate-atropine (LOMOTIL) 2.5-0.025 MG tablet Take 1 tablet by mouth 4 (four) times daily as needed for diarrhea or loose stools. 30 tablet 0  . Fluticasone-Salmeterol (ADVAIR) 500-50 MCG/DOSE AEPB Inhale 1 puff into the lungs 2 (two) times daily.    Marland Kitchen HYDROcodone-acetaminophen (HYCET) 7.5-325 mg/15 ml solution Take 10 mLs by mouth 4 (four) times daily as needed for moderate pain (use before meals and at bedtime prn). 473 mL 0  .  ibuprofen (ADVIL) 800 MG tablet Take 800 mg by mouth every 8 (eight) hours as needed.    Marland Kitchen levothyroxine (SYNTHROID) 112 MCG tablet TAKE 1 TABLET(112 MCG) BY MOUTH DAILY BEFORE BREAKFAST 90 tablet 1  . Multiple Vitamin (MULTIVITAMIN WITH MINERALS) TABS tablet Take 1 tablet by mouth daily.     Marland Kitchen OVER THE COUNTER MEDICATION 1 capsule. [place] tail-"mushroom in a capsule"     . prochlorperazine (COMPAZINE) 10 MG tablet TAKE 1 TABLET(10 MG) BY MOUTH EVERY 6 HOURS AS NEEDED FOR NAUSEA OR VOMITING 30 tablet 0  . topiramate (TOPAMAX) 50 MG tablet Take 1 tablet (50 mg total) by mouth 2 (two) times daily. 180 tablet 1  No current facility-administered medications for this visit.    REVIEW OF SYSTEMS:   Constitutional: ( - ) fevers, ( - )  chills , ( - ) night sweats Eyes: ( - ) blurriness of vision, ( - ) double vision, ( - ) watery eyes Ears, nose, mouth, throat, and face: ( - ) mucositis, ( - ) sore throat Respiratory: ( - ) cough, ( - ) dyspnea, ( - ) wheezes Cardiovascular: ( - ) palpitation, ( - ) chest discomfort, ( - ) lower extremity swelling Gastrointestinal:  ( - ) nausea, ( - ) heartburn, ( - ) change in bowel habits Skin: ( - ) abnormal skin rashes Lymphatics: ( - ) new lymphadenopathy, ( - ) easy bruising Neurological: ( - ) numbness, ( - ) tingling, ( - ) new weaknesses Behavioral/Psych: ( - ) mood change, ( - ) new changes  All other systems were reviewed with the patient and are negative.  PHYSICAL EXAMINATION: ECOG PERFORMANCE STATUS: 2 - Symptomatic, <50% confined to bed  Vitals:   05/18/21 1045  BP: (!) 88/57  Pulse: (!) 102  Resp: 18  Temp: 97.6 F (36.4 C)  SpO2: 98%   Filed Weights   05/18/21 1045  Weight: 207 lb 12.8 oz (94.3 kg)    GENERAL: Chronically ill-appearing elderly Caucasian female alert, no distress and comfortable SKIN: skin color, texture, turgor are normal, no rashes or significant lesions EYES: conjunctiva are pink and non-injected, sclera  clear LUNGS: clear to auscultation and percussion with normal breathing effort HEART: regular rate & rhythm and no murmurs and no lower extremity edema Musculoskeletal: no cyanosis of digits and no clubbing  PSYCH: alert & oriented x 3, fluent speech NEURO: no focal motor/sensory deficits  LABORATORY DATA:  I have reviewed the data as listed CBC Latest Ref Rng & Units 05/18/2021 04/26/2021 03/15/2021  WBC 4.0 - 10.5 K/uL 7.2 7.7 5.7  Hemoglobin 12.0 - 15.0 g/dL 13.1 12.9 13.5  Hematocrit 36.0 - 46.0 % 39.2 38.8 40.5  Platelets 150 - 400 K/uL 272 300 250    CMP Latest Ref Rng & Units 04/26/2021 03/15/2021 02/22/2021  Glucose 70 - 99 mg/dL 120(H) 75 79  BUN 8 - 23 mg/dL 25(H) 25(H) 23  Creatinine 0.44 - 1.00 mg/dL 1.35(H) 1.10(H) 1.26(H)  Sodium 135 - 145 mmol/L 144 142 139  Potassium 3.5 - 5.1 mmol/L 3.8 3.4(L) 3.7  Chloride 98 - 111 mmol/L 109 110 109  CO2 22 - 32 mmol/L 23 23 23   Calcium 8.9 - 10.3 mg/dL 9.1 8.7(L) 9.1  Total Protein 6.5 - 8.1 g/dL 6.8 6.8 7.1  Total Bilirubin 0.3 - 1.2 mg/dL 0.3 0.3 0.3  Alkaline Phos 38 - 126 U/L 120 113 119  AST 15 - 41 U/L 16 11(L) 15  ALT 0 - 44 U/L 9 10 10     RADIOGRAPHIC STUDIES: MR BRAIN W WO CONTRAST  Result Date: 05/05/2021  [facility] [address] [place], [place] [address] [phone] NEUROIMAGING REPORT STUDY DATE:05/04/2021 PATIENT NAME: Shermika Balthaser DOB: [birth year] MRN: [id number] ORDERING CLINICIAN: Dr. Jannifer Franklin CLINICAL HISTORY: 70 year old lady being evaluated for episodic left-sided numbness and headaches COMPARISON FILMS: MRI brain with and without contrast 02/11/2019 EXAM: MRI brain with and without contrast TECHNIQUE: MRI of the brain with and without contrast was obtained utilizing 5 mm axial slices with T1, T2, T2 flair, T2 star gradient echo and diffusion weighted views.  T1 sagittal, T2 coronal and postcontrast views in the axial and  coronal plane were obtained. CONTRAST: 20 mL IV MultiHance IMAGING SITE:  [facility] FINDINGS: The brain parenchyma shows multiple confluent periventricular and subcortical white matter changes of small vessel disease versus radiation-induced changes given patient's history of whole brain radiation.  These also appear to involve the brainstem ventral part.  Diffusion-weighted imaging negative for acute ischemia.  Gradient-echo sequences shows area of micro tiny punctate areas of microhemorrhages in the subcortical white matter including a large 7 mm area in the right posterior frontal subcortical region.  This area also shows homogeneous postcontrast enhancement with serpiginous vessel leading to it best seen on gradient echo sequences suggesting possibility of venous angioma and developmental venous anomaly.  There is no surrounding cytotoxic edema to this lesion to suggest raise concern for metastasis.  No other structural lesion, tumor or infarct is noted.  Diffusion-weighted imaging is negative for acute ischemia.  The subarachnoid spaces and ventricular system show slight dilatation.  Calvarium shows no abnormalities.  Orbits appear unremarkable paranasal sinuses show only minor chronic mucosal thickening.  Pituitary gland and cerebellar tonsils appear normal.  Flow-voids of large vessels of intracranial circulation appear to be patent.   Abnormal MRI scan of the brain with and without contrast showing small 7 mm enhancing lesion in the right posterior frontal subcortical region likely of developmental venous anomaly and underlying capillary hemangioma.  Metastasis would be unlikely based on imaging characteristics.  There are advanced changes of subcortical and brainstem microvascular disease likely due to effects of prior brain radiation.  Compared to previous MRI from 2020 the enhancing lesion in the right frontal subcortical region appears to be new and white matter changes appear slightly more advanced. INTERPRETING PHYSICIAN: Antony Contras, MD Certified in   Neuroimaging by [place] [facility] and [facility]   [place] 70 y.o. female with medical history significant for limited stage Small Cell Lung cancer who presents for a follow up visit.  After review the labs, reviewed records, schedule patient the findings most consistent with a limited stage small cell lung cancer.  She is currently on therapy with atezolizumab and had recently requested to transition care from Dr. Julien Nordmann to myself.  We are happy to assume the care of this patient.  Previously we discussed the diagnosis of small cell lung cancer and the treatment of atezolizumab.  We explained the mechanism of action to the patient and her husband.  The patient noted that she is having some mental fogging for which she would like to discontinue this medication.  Strongly encourage her to reconsider and I believe that this is what is keeping the tumor from metastasizing and growing.  She is in agreement is willing and able to continue treatment at this time  #Small Cell Lung Cancer, Recurrent Limited Stage --Patient was previously questioning whether or not she would like to continue atezolizumab monotherapy.  Discussed with her and her husband the risks and benefits of this therapy and the likelihood that this is what is keeping her alive.  She was agreeable to continue therapy at this time. --MRI brain scheduled for 05/04/2021 --last CT C/A/P on 02/19/2021. Next due in June 2022 --continue atezolizumab maintenance therapy. She is currently Cycle 27.  --RTC in 3 weeks with next cycle of chemotherapy.   No orders of the defined types were placed in this encounter.  All questions were answered. The patient knows to call the clinic with any problems, questions or concerns.  A total of  more than 30 minutes were spent on this encounter and over half of that time was spent on counseling and coordination of care as outlined above.    Ledell Peoples, MD Department of Hematology/Oncology [place] [facility] Phone: [phone] Pager: [phone] Email: [email or web address]  05/18/2021 11:08 AM

## 2021-05-27 ENCOUNTER — Other Ambulatory Visit: Payer: Self-pay

## 2021-05-27 ENCOUNTER — Ambulatory Visit (HOSPITAL_COMMUNITY)
Admission: RE | Admit: 2021-05-27 | Discharge: 2021-05-27 | Disposition: A | Discharge status: Home / Self Care | Payer: Medicare Other | Source: Ambulatory Visit | Attending: Hematology and Oncology | Admitting: Hematology and Oncology

## 2021-05-27 DIAGNOSIS — J9809 Other diseases of bronchus, not elsewhere classified: Secondary | ICD-10-CM | POA: Diagnosis not present

## 2021-05-27 DIAGNOSIS — K429 Umbilical hernia without obstruction or gangrene: Secondary | ICD-10-CM | POA: Diagnosis not present

## 2021-05-27 DIAGNOSIS — C3491 Malignant neoplasm of unspecified part of right bronchus or lung: Secondary | ICD-10-CM | POA: Diagnosis not present

## 2021-05-27 DIAGNOSIS — J439 Emphysema, unspecified: Secondary | ICD-10-CM | POA: Diagnosis not present

## 2021-05-27 DIAGNOSIS — N281 Cyst of kidney, acquired: Secondary | ICD-10-CM | POA: Diagnosis not present

## 2021-05-27 DIAGNOSIS — I700 Atherosclerosis of aorta: Secondary | ICD-10-CM | POA: Diagnosis not present

## 2021-05-27 DIAGNOSIS — J9811 Atelectasis: Secondary | ICD-10-CM | POA: Diagnosis not present

## 2021-05-27 DIAGNOSIS — J941 Fibrothorax: Secondary | ICD-10-CM | POA: Diagnosis not present

## 2021-05-27 DIAGNOSIS — I2510 Atherosclerotic heart disease of native coronary artery without angina pectoris: Secondary | ICD-10-CM | POA: Diagnosis not present

## 2021-05-27 MED ORDER — SODIUM CHLORIDE (PF) 0.9 % IJ SOLN
INTRAMUSCULAR | Status: AC
  Filled 2021-05-27: qty 50

## 2021-05-27 MED ORDER — HEPARIN SOD (PORK) LOCK FLUSH 100 UNIT/ML IV SOLN
INTRAVENOUS | Status: AC
  Filled 2021-05-27: qty 5

## 2021-05-27 MED ORDER — IOHEXOL 300 MG/ML  SOLN
100.0000 mL | Freq: Once | INTRAMUSCULAR | Status: AC | PRN
  Administered 2021-05-27: 100 mL via INTRAVENOUS

## 2021-05-27 MED ORDER — HEPARIN SOD (PORK) LOCK FLUSH 100 UNIT/ML IV SOLN: 500.0000 [IU] | Freq: Once | INTRAVENOUS | Status: DC

## 2021-06-01 ENCOUNTER — Telehealth: Payer: Self-pay | Admitting: Hematology and Oncology

## 2021-06-01 NOTE — Telephone Encounter (Signed)
Called Mrs. Ziebarth to discuss the results of her CT scan.  CT scan shows stable disease and no evidence of progression.  We will continue with her current treatment plan at this time.  Ledell Peoples, MD Department of Hematology/Oncology [place] at [facility] Phone: [phone] Pager: [phone] Email: [email or web address]

## 2021-06-04 ENCOUNTER — Telehealth: Payer: Self-pay | Admitting: Hematology and Oncology

## 2021-06-04 NOTE — Telephone Encounter (Signed)
Cancelled upcoming appointment per 6/17 schedule message. Patient stated she will call back to reschedule. Informed provider.

## 2021-06-07 ENCOUNTER — Ambulatory Visit: Payer: Medicaid Other

## 2021-06-07 ENCOUNTER — Other Ambulatory Visit: Payer: Medicaid Other

## 2021-06-07 ENCOUNTER — Ambulatory Visit: Payer: Medicaid Other | Admitting: Hematology and Oncology

## 2021-06-07 ENCOUNTER — Telehealth: Payer: Self-pay | Admitting: *Deleted

## 2021-06-07 NOTE — Telephone Encounter (Signed)
TCT patient regarding her cancelled appt for labs, MD and infusion on 06/07/21. No answer but was able to leave vm message for her to call back at her convenience to reschedule.

## 2021-06-23 DIAGNOSIS — Z20822 Contact with and (suspected) exposure to covid-19: Secondary | ICD-10-CM | POA: Diagnosis not present

## 2021-06-24 ENCOUNTER — Telehealth: Payer: Self-pay | Admitting: Hematology and Oncology

## 2021-06-24 NOTE — Telephone Encounter (Signed)
Called pt to r/s appts per 7/7 sch msg. No answer. Left msg for pt to call back to discuss r/s options.

## 2021-06-28 ENCOUNTER — Telehealth: Payer: Self-pay | Admitting: *Deleted

## 2021-06-28 ENCOUNTER — Ambulatory Visit: Payer: Medicaid Other | Admitting: Hematology and Oncology

## 2021-06-28 ENCOUNTER — Ambulatory Visit: Payer: Medicaid Other

## 2021-06-28 ENCOUNTER — Other Ambulatory Visit: Payer: Medicaid Other

## 2021-06-28 NOTE — Telephone Encounter (Signed)
TCT patient as she has not shown up today. Spoke with her husband. He states she is napping right now. Informed him that she was scheduled for labs, Dr. Lorenso Courier and her infusion today. Advised that she had missed her last infusion as well.  Husband states she told him that she does not want any more treatments. Advised that she has not told us about her decisions. Her husband said he would have her call back when she wakes up.  Dr. Lorenso Courier aware.  Appts cancelled for today

## 2021-06-28 NOTE — Progress Notes (Signed)
Patient cancelled this and upcoming visits.

## 2021-07-06 DIAGNOSIS — F17200 Nicotine dependence, unspecified, uncomplicated: Secondary | ICD-10-CM | POA: Diagnosis not present

## 2021-07-06 DIAGNOSIS — W19XXXA Unspecified fall, initial encounter: Secondary | ICD-10-CM | POA: Diagnosis not present

## 2021-07-06 DIAGNOSIS — N1832 Chronic kidney disease, stage 3b: Secondary | ICD-10-CM | POA: Diagnosis not present

## 2021-07-06 DIAGNOSIS — C3401 Malignant neoplasm of right main bronchus: Secondary | ICD-10-CM | POA: Diagnosis not present

## 2021-07-06 DIAGNOSIS — S80212A Abrasion, left knee, initial encounter: Secondary | ICD-10-CM | POA: Diagnosis not present

## 2021-07-06 DIAGNOSIS — F5101 Primary insomnia: Secondary | ICD-10-CM | POA: Diagnosis not present

## 2021-07-06 DIAGNOSIS — F321 Major depressive disorder, single episode, moderate: Secondary | ICD-10-CM | POA: Diagnosis not present

## 2021-07-06 DIAGNOSIS — J449 Chronic obstructive pulmonary disease, unspecified: Secondary | ICD-10-CM | POA: Diagnosis not present

## 2021-07-06 DIAGNOSIS — E039 Hypothyroidism, unspecified: Secondary | ICD-10-CM | POA: Diagnosis not present

## 2021-07-19 ENCOUNTER — Encounter: Payer: Self-pay | Admitting: Internal Medicine

## 2021-07-20 ENCOUNTER — Telehealth: Payer: Self-pay | Admitting: Hematology and Oncology

## 2021-07-20 NOTE — Telephone Encounter (Signed)
Scheduled appt per 8/1 sch msg. Pt aware.  

## 2021-07-29 ENCOUNTER — Ambulatory Visit: Payer: Medicare Other | Admitting: Neurology

## 2021-08-12 ENCOUNTER — Inpatient Hospital Stay: Payer: Medicare Other | Admitting: Hematology and Oncology

## 2021-08-12 ENCOUNTER — Inpatient Hospital Stay: Payer: Medicare Other

## 2021-08-17 DIAGNOSIS — E039 Hypothyroidism, unspecified: Secondary | ICD-10-CM | POA: Diagnosis not present

## 2021-08-19 ENCOUNTER — Inpatient Hospital Stay: Payer: Medicare Other

## 2021-08-19 ENCOUNTER — Inpatient Hospital Stay: Payer: Medicare Other | Attending: Internal Medicine | Admitting: Hematology and Oncology

## 2021-08-19 DIAGNOSIS — R531 Weakness: Secondary | ICD-10-CM | POA: Insufficient documentation

## 2021-08-19 DIAGNOSIS — C3491 Malignant neoplasm of unspecified part of right bronchus or lung: Secondary | ICD-10-CM | POA: Insufficient documentation

## 2021-08-19 DIAGNOSIS — Z9221 Personal history of antineoplastic chemotherapy: Secondary | ICD-10-CM | POA: Insufficient documentation

## 2021-08-19 DIAGNOSIS — Z923 Personal history of irradiation: Secondary | ICD-10-CM | POA: Insufficient documentation

## 2021-09-06 ENCOUNTER — Other Ambulatory Visit: Payer: Medicare Other

## 2021-09-06 ENCOUNTER — Ambulatory Visit: Payer: Medicare Other | Admitting: Hematology and Oncology

## 2021-09-08 ENCOUNTER — Telehealth: Payer: Self-pay | Admitting: *Deleted

## 2021-09-08 ENCOUNTER — Other Ambulatory Visit: Payer: Self-pay | Admitting: Hematology and Oncology

## 2021-09-08 DIAGNOSIS — C3491 Malignant neoplasm of unspecified part of right bronchus or lung: Secondary | ICD-10-CM

## 2021-09-08 NOTE — Telephone Encounter (Signed)
Received call from patient's husband to discuss pt's appts. Advised that pt has not shown for her last 3 scheduled appts that included labs, MD visit and her treatment appt.  He states that Tammy Bowers has not felt well. Asked Tammy Bowers if he could be more specific. He states her COPD has been bothering her. Reminded him that she had not had cancer treatments since the ned of May 2022. He understands this. He states Tammy Bowers won't come back until she gets a scan to see the status of her lung cancer. She will see Tammy Bowers after that scan.  Tammy Bowers made aware and he will order her scans. Scheduling message sent  for pt to have MD visit after the scan .

## 2021-09-10 ENCOUNTER — Ambulatory Visit (HOSPITAL_COMMUNITY): Payer: Medicare Other

## 2021-09-10 ENCOUNTER — Ambulatory Visit (HOSPITAL_COMMUNITY)
Admission: RE | Admit: 2021-09-10 | Discharge: 2021-09-10 | Disposition: A | Discharge status: Home / Self Care | Payer: Medicare Other | Source: Ambulatory Visit | Attending: Hematology and Oncology | Admitting: Hematology and Oncology

## 2021-09-10 ENCOUNTER — Other Ambulatory Visit: Payer: Self-pay

## 2021-09-10 ENCOUNTER — Inpatient Hospital Stay: Payer: Medicare Other

## 2021-09-10 ENCOUNTER — Encounter (HOSPITAL_COMMUNITY): Payer: Self-pay

## 2021-09-10 DIAGNOSIS — S2232XA Fracture of one rib, left side, initial encounter for closed fracture: Secondary | ICD-10-CM | POA: Diagnosis not present

## 2021-09-10 DIAGNOSIS — J439 Emphysema, unspecified: Secondary | ICD-10-CM | POA: Diagnosis not present

## 2021-09-10 DIAGNOSIS — C3491 Malignant neoplasm of unspecified part of right bronchus or lung: Secondary | ICD-10-CM | POA: Diagnosis not present

## 2021-09-10 DIAGNOSIS — I2510 Atherosclerotic heart disease of native coronary artery without angina pectoris: Secondary | ICD-10-CM | POA: Diagnosis not present

## 2021-09-10 DIAGNOSIS — K429 Umbilical hernia without obstruction or gangrene: Secondary | ICD-10-CM | POA: Diagnosis not present

## 2021-09-10 DIAGNOSIS — J9819 Other pulmonary collapse: Secondary | ICD-10-CM | POA: Diagnosis not present

## 2021-09-10 DIAGNOSIS — C3490 Malignant neoplasm of unspecified part of unspecified bronchus or lung: Secondary | ICD-10-CM | POA: Diagnosis not present

## 2021-09-10 DIAGNOSIS — K7689 Other specified diseases of liver: Secondary | ICD-10-CM | POA: Diagnosis not present

## 2021-09-10 DIAGNOSIS — Z95828 Presence of other vascular implants and grafts: Secondary | ICD-10-CM

## 2021-09-10 DIAGNOSIS — I700 Atherosclerosis of aorta: Secondary | ICD-10-CM | POA: Diagnosis not present

## 2021-09-10 LAB — CBC WITH DIFFERENTIAL (CANCER CENTER ONLY)
Abs Immature Granulocytes: 0.01 10*3/uL (ref 0.00–0.07)
Basophils Absolute: 0.1 10*3/uL (ref 0.0–0.1)
Basophils Relative: 1 %
Eosinophils Absolute: 0.5 10*3/uL (ref 0.0–0.5)
Eosinophils Relative: 7 %
HCT: 43.4 % (ref 36.0–46.0)
Hemoglobin: 14.4 g/dL (ref 12.0–15.0)
Immature Granulocytes: 0 %
Lymphocytes Relative: 11 %
Lymphs Abs: 0.7 10*3/uL (ref 0.7–4.0)
MCH: 30.1 pg (ref 26.0–34.0)
MCHC: 33.2 g/dL (ref 30.0–36.0)
MCV: 90.8 fL (ref 80.0–100.0)
Monocytes Absolute: 0.5 10*3/uL (ref 0.1–1.0)
Monocytes Relative: 7 %
Neutro Abs: 5.1 10*3/uL (ref 1.7–7.7)
Neutrophils Relative %: 74 %
Platelet Count: 258 10*3/uL (ref 150–400)
RBC: 4.78 MIL/uL (ref 3.87–5.11)
RDW: 13 % (ref 11.5–15.5)
WBC Count: 6.9 10*3/uL (ref 4.0–10.5)
nRBC: 0 % (ref 0.0–0.2)

## 2021-09-10 LAB — CMP (CANCER CENTER ONLY)
ALT: 7 U/L (ref 0–44)
AST: 13 U/L — ABNORMAL LOW (ref 15–41)
Albumin: 3.5 g/dL (ref 3.5–5.0)
Alkaline Phosphatase: 104 U/L (ref 38–126)
Anion gap: 11 (ref 5–15)
BUN: 16 mg/dL (ref 8–23)
CO2: 23 mmol/L (ref 22–32)
Calcium: 9.6 mg/dL (ref 8.9–10.3)
Chloride: 108 mmol/L (ref 98–111)
Creatinine: 1.01 mg/dL — ABNORMAL HIGH (ref 0.44–1.00)
GFR, Estimated: >60 mL/min (ref >60)
Glucose, Bld: 75 mg/dL (ref 70–99)
Potassium: 3.3 mmol/L — ABNORMAL LOW (ref 3.5–5.1)
Sodium: 142 mmol/L (ref 135–145)
Total Bilirubin: 0.3 mg/dL (ref 0.3–1.2)
Total Protein: 7 g/dL (ref 6.5–8.1)

## 2021-09-10 MED ORDER — HEPARIN SOD (PORK) LOCK FLUSH 100 UNIT/ML IV SOLN
500.0000 [IU] | Freq: Once | INTRAVENOUS | Status: AC
  Administered 2021-09-10: 500 [IU]

## 2021-09-10 MED ORDER — IOHEXOL 350 MG/ML SOLN
80.0000 mL | Freq: Once | INTRAVENOUS | Status: AC | PRN
  Administered 2021-09-10: 80 mL via INTRAVENOUS

## 2021-09-10 MED ORDER — SODIUM CHLORIDE 0.9% FLUSH
10.0000 mL | Freq: Once | INTRAVENOUS | Status: AC
  Administered 2021-09-10: 10 mL

## 2021-09-10 NOTE — Progress Notes (Signed)
Pt's port did not give any blood return today therefore she will need peripheral draw only. Pt is aware next visit she may need cathflo.

## 2021-09-13 ENCOUNTER — Other Ambulatory Visit: Payer: Self-pay

## 2021-09-13 ENCOUNTER — Inpatient Hospital Stay (HOSPITAL_BASED_OUTPATIENT_CLINIC_OR_DEPARTMENT_OTHER): Payer: Medicare Other | Admitting: Hematology and Oncology

## 2021-09-13 ENCOUNTER — Inpatient Hospital Stay: Payer: Medicare Other

## 2021-09-13 VITALS — BP 122/76 | HR 106 | Temp 96.2°F | Resp 18 | Wt 196.4 lb

## 2021-09-13 DIAGNOSIS — Z95828 Presence of other vascular implants and grafts: Secondary | ICD-10-CM | POA: Diagnosis not present

## 2021-09-13 DIAGNOSIS — C3491 Malignant neoplasm of unspecified part of right bronchus or lung: Secondary | ICD-10-CM | POA: Diagnosis not present

## 2021-09-13 DIAGNOSIS — R531 Weakness: Secondary | ICD-10-CM | POA: Diagnosis not present

## 2021-09-13 DIAGNOSIS — R5382 Chronic fatigue, unspecified: Secondary | ICD-10-CM | POA: Diagnosis not present

## 2021-09-13 DIAGNOSIS — G4709 Other insomnia: Secondary | ICD-10-CM | POA: Diagnosis not present

## 2021-09-13 DIAGNOSIS — Z23 Encounter for immunization: Secondary | ICD-10-CM | POA: Diagnosis not present

## 2021-09-13 DIAGNOSIS — R200 Anesthesia of skin: Secondary | ICD-10-CM | POA: Diagnosis not present

## 2021-09-13 DIAGNOSIS — Z923 Personal history of irradiation: Secondary | ICD-10-CM | POA: Diagnosis not present

## 2021-09-13 DIAGNOSIS — Z9221 Personal history of antineoplastic chemotherapy: Secondary | ICD-10-CM | POA: Diagnosis not present

## 2021-09-13 LAB — TSH: TSH: 0.191 u[IU]/mL — ABNORMAL LOW (ref 0.308–3.960)

## 2021-09-13 MED ORDER — GUAIFENESIN-CODEINE 100-10 MG/5ML PO SYRP: 5.0000 mL | ORAL_SOLUTION | Freq: Three times a day (TID) | ORAL | 0 refills | Status: DC | PRN

## 2021-09-13 MED ORDER — ACETAMINOPHEN-CODEINE #3 300-30 MG PO TABS: 1.0000 | ORAL_TABLET | Freq: Three times a day (TID) | ORAL | 0 refills | Status: DC | PRN

## 2021-09-13 NOTE — Progress Notes (Signed)
[place] Telephone:([phone]) [phone]   [phone]  PROGRESS NOTE  Patient Care Team: Lujean Amel, MD as PCP - General (Family Medicine) Curt Bears, MD as Consulting Physician (Oncology)  Hematological/Oncological History # Small Cell Lung Cancer, Limited stage (T2b, N2, M0) 03/06/2017: completed Carbo/Etop with radiation x 6 cycles 03/05/2019: started Carbo/Etop/Atezo for recurrence with Dr. Julien Nordmann. 04/26/2021: transfer care to Dr. Lorenso Courier. Cycle 26 of atezolizumab.  05/18/2021: Cycle 27 of atezolizumab maintenance therapy. Last treatment she received. Wished to d/c immunotherapy.   Interval History:  Tammy Bowers 70 y.o. female with medical history significant for limited stage Small Cell Lung cancer who presents for a follow up visit. The patient's last visit was on 05/18/2021. In the interim since the last visit she has missed and/or canceled numerous visits.  On exam today Tammy Bowers reports that she does have a cough which can keep her up at night.  She is quite weak and spends most of her time in her wheelchair.  She notes that overall she does not feel well.  She is insistent however that she does not wish to pursue any further chemotherapy.  She is eating [place] tail mushrooms in order to try to slow down the growth of her tumor.  She does have occasional episodes of pain for which she would like a as needed prescription for breakthrough episodes.  She denies any fevers, chills, sweats, nausea, vomiting or diarrhea.  Her shortness of breath is at baseline.  A full 10 point ROS is listed below.   MEDICAL HISTORY:  Past Medical History:  Diagnosis Date   Adenopathy 10/10/2016   PERICARINAL   Anemia    Arthritis    Asthma    COPD (chronic obstructive pulmonary disease) (HCC)    Dyspnea    Headache    migraines   Hilar mass 10/10/2016   RIGHT   History of kidney stones    Insomnia    Lung nodules 10/10/2016   RIGHT LOWER LOBE   Mass of both  adrenal glands ([place]) 10/10/2016   Mood swing    Pneumonia    SCL CA dx'd 2017   lung cancer Small Cell; recurrence 10/2018   Spinal headache    Substance abuse ([place])    TOBACCO   UNSPECIFIED INFECTION OF BONE ANKLE AND FOOT 10/22/2009   Annotation: left ankle Qualifier: Diagnosis of  By: Gustavo Lah      SURGICAL HISTORY: Past Surgical History:  Procedure Laterality Date   ANKLE SURGERY     hardware removal, and initial ankle surgery   APPENDECTOMY     IR IMAGING GUIDED PORT INSERTION  04/12/2019   LUNG BIOPSY N/A 10/19/2016   Procedure: LUNG BIOPSY;  Surgeon: Grace Isaac, MD;  Location: [place] [facility];  Service: Thoracic;  Laterality: N/A;   LUNG BIOPSY N/A 02/20/2019   Procedure: LUNG BIOPSY;  Surgeon: Grace Isaac, MD;  Location: [facility];  Service: Thoracic;  Laterality: N/A;   TONSILLECTOMY     VIDEO BRONCHOSCOPY WITH ENDOBRONCHIAL ULTRASOUND N/A 10/19/2016   Procedure: VIDEO BRONCHOSCOPY WITH ENDOBRONCHIAL ULTRASOUND;  Surgeon: Grace Isaac, MD;  Location[facility];  Service: Thoracic;  Laterality: N/A;   VIDEO BRONCHOSCOPY WITH ENDOBRONCHIAL ULTRASOUND N/A 02/20/2019   Procedure: VIDEO BRONCHOSCOPY WITH ENDOBRONCHIAL ULTRASOUND;  Surgeon: Grace Isaac, MD;  Location: [facility];  Service: Thoracic;  Laterality: N/A;    SOCIAL HISTORY: Social History   Socioeconomic History   Marital status: Married    Spouse name:  Tammy Bowers   Number of children: 2   Years of education: Not on file   Highest education level: Not on file  Occupational History   Occupation: retired  Tobacco Use   Smoking status: Every Day    Packs/day: 1.00    Years: 50.00    Pack years: 50.00    Types: Cigarettes   Smokeless tobacco: Never  Vaping Use   Vaping Use: Never used  Substance and Sexual Activity   Alcohol use: Yes    Alcohol/week: 7.0 standard drinks    Types: 7 Shots of liquor per week   Drug use: Not Currently    Types: Marijuana    Comment: August/Sept 2017 last ttime   Sexual  activity: Yes    Partners: Male    Birth control/protection: Post-menopausal  Other Topics Concern   Not on file  Social History Narrative   Lives with husband Tammy Bowers   Left Handed   Drinks 3-4 cups daily   Social Determinants of Health   Financial Resource Strain: Not on file  Food Insecurity: Not on file  Transportation Needs: Not on file  Physical Activity: Not on file  Stress: Not on file  Social Connections: Not on file  Intimate Partner Violence: Not on file    FAMILY HISTORY: Family History  Problem Relation Age of Onset   Heart disease Father    Colon cancer Father    Heart disease Paternal Grandfather    Ovarian cancer Mother     ALLERGIES:  is allergic to lidocaine.  MEDICATIONS:  Current Outpatient Medications  Medication Sig Dispense Refill   acetaminophen-codeine (TYLENOL #3) 300-30 MG tablet Take 1 tablet by mouth every 8 (eight) hours as needed for moderate pain. 21 tablet 0   guaiFENesin-codeine (ROBITUSSIN AC) 100-10 MG/5ML syrup Take 5 mLs by mouth 3 (three) times daily as needed for cough. 120 mL 0   albuterol (PROVENTIL HFA;VENTOLIN HFA) 108 (90 BASE) MCG/ACT inhaler Inhale 2 puffs into the lungs every 4 (four) hours as needed for wheezing.     ALPRAZolam (XANAX) 0.5 MG tablet Take 2 tablets approximately 45 minutes prior to the MRI study, take a third tablet if needed. 3 tablet 0   clonazePAM (KLONOPIN) 0.5 MG tablet      cyclobenzaprine (FLEXERIL) 5 MG tablet Take 1 tablet by mouth 3 times/day as needed-between meals & bedtime.     diphenoxylate-atropine (LOMOTIL) 2.5-0.025 MG tablet Take 1 tablet by mouth 4 (four) times daily as needed for diarrhea or loose stools. (Patient not taking: Reported on 09/13/2021) 30 tablet 0   Fluticasone-Salmeterol (ADVAIR) 500-50 MCG/DOSE AEPB Inhale 1 puff into the lungs 2 (two) times daily.     HYDROcodone-acetaminophen (HYCET) 7.5-325 mg/15 ml solution Take 10 mLs by mouth 4 (four) times daily as needed for moderate  pain (use before meals and at bedtime prn). (Patient not taking: Reported on 09/13/2021) 473 mL 0   ibuprofen (ADVIL) 800 MG tablet Take 800 mg by mouth every 8 (eight) hours as needed.     levothyroxine (SYNTHROID) 112 MCG tablet TAKE 1 TABLET(112 MCG) BY MOUTH DAILY BEFORE BREAKFAST 90 tablet 1   Multiple Vitamin (MULTIVITAMIN WITH MINERALS) TABS tablet Take 1 tablet by mouth daily.      OVER THE COUNTER MEDICATION 1 capsule. [place] tail-"mushroom in a capsule"      prochlorperazine (COMPAZINE) 10 MG tablet TAKE 1 TABLET(10 MG) BY MOUTH EVERY 6 HOURS AS NEEDED FOR NAUSEA OR VOMITING 30 tablet 0   topiramate (TOPAMAX)  50 MG tablet Take 1 tablet (50 mg total) by mouth 2 (two) times daily. 180 tablet 1   No current facility-administered medications for this visit.    REVIEW OF SYSTEMS:   Constitutional: ( - ) fevers, ( - )  chills , ( - ) night sweats Eyes: ( - ) blurriness of vision, ( - ) double vision, ( - ) watery eyes Ears, nose, mouth, throat, and face: ( - ) mucositis, ( - ) sore throat Respiratory: ( - ) cough, ( - ) dyspnea, ( - ) wheezes Cardiovascular: ( - ) palpitation, ( - ) chest discomfort, ( - ) lower extremity swelling Gastrointestinal:  ( - ) nausea, ( - ) heartburn, ( - ) change in bowel habits Skin: ( - ) abnormal skin rashes Lymphatics: ( - ) new lymphadenopathy, ( - ) easy bruising Neurological: ( - ) numbness, ( - ) tingling, ( - ) new weaknesses Behavioral/Psych: ( - ) mood change, ( - ) new changes  All other systems were reviewed with the patient and are negative.  PHYSICAL EXAMINATION: ECOG PERFORMANCE STATUS: 2 - Symptomatic, <50% confined to bed  Vitals:   09/13/21 1544  BP: 122/76  Pulse: (!) 106  Resp: 18  Temp: (!) 96.2 F (35.7 C)  SpO2: 94%   Filed Weights   09/13/21 1544  Weight: 196 lb 6.4 oz (89.1 kg)    GENERAL: Chronically ill-appearing elderly Caucasian female alert, no distress and comfortable SKIN: skin color, texture, turgor are  normal, no rashes or significant lesions EYES: conjunctiva are pink and non-injected, sclera clear LUNGS: clear to auscultation and percussion with normal breathing effort HEART: regular rate & rhythm and no murmurs and no lower extremity edema Musculoskeletal: no cyanosis of digits and no clubbing  PSYCH: alert & oriented x 3, fluent speech NEURO: no focal motor/sensory deficits  LABORATORY DATA:  I have reviewed the data as listed CBC Latest Ref Rng & Units 09/10/2021 05/18/2021 04/26/2021  WBC 4.0 - 10.5 K/uL 6.9 7.2 7.7  Hemoglobin 12.0 - 15.0 g/dL 14.4 13.1 12.9  Hematocrit 36.0 - 46.0 % 43.4 39.2 38.8  Platelets 150 - 400 K/uL 258 272 300    CMP Latest Ref Rng & Units 09/10/2021 05/18/2021 04/26/2021  Glucose 70 - 99 mg/dL 75 122(H) 120(H)  BUN 8 - 23 mg/dL 16 20 25(H)  Creatinine 0.44 - 1.00 mg/dL 1.01(H) 1.15(H) 1.35(H)  Sodium 135 - 145 mmol/L 142 142 144  Potassium 3.5 - 5.1 mmol/L 3.3(L) 3.5 3.8  Chloride 98 - 111 mmol/L 108 110 109  CO2 22 - 32 mmol/L 23 23 23   Calcium 8.9 - 10.3 mg/dL 9.6 9.4 9.1  Total Protein 6.5 - 8.1 g/dL 7.0 6.5 6.8  Total Bilirubin 0.3 - 1.2 mg/dL 0.3 0.3 0.3  Alkaline Phos 38 - 126 U/L 104 101 120  AST 15 - 41 U/L 13(L) 15 16  ALT 0 - 44 U/L 7 8 9     RADIOGRAPHIC STUDIES: CT CHEST ABDOMEN PELVIS W CONTRAST  Result Date: 09/13/2021 CLINICAL DATA:  Lung cancer. EXAM: CT CHEST, ABDOMEN, AND PELVIS WITH CONTRAST TECHNIQUE: Multidetector CT imaging of the chest, abdomen and pelvis was performed following the standard protocol during bolus administration of intravenous contrast. CONTRAST:  42mL OMNIPAQUE IOHEXOL 350 MG/ML SOLN COMPARISON:  05/27/2021 and CT abdomen 02/19/2021. FINDINGS: CT CHEST FINDINGS Cardiovascular: Right IJ Port-A-Cath terminates in the SVC. Atherosclerotic calcification of the aorta and coronary arteries. Heart size normal. No pericardial effusion. Mediastinum/Nodes: Soft  tissue thickening in the right hilum, unchanged. No  pathologically enlarged mediastinal, hilar or axillary lymph nodes. Esophagus is grossly unremarkable. Lungs/Pleura: Image quality is degraded by respiratory motion. Centrilobular and paraseptal emphysema. Post treatment pulmonary consolidation/retraction in the right perihilar region, as before. Associated narrowing of the right lung bronchi. Increased volume loss in the right middle lobe. No pleural fluid. Minimal adherent debris in the airway. Musculoskeletal: Old anterolateral left rib fracture. No worrisome lytic or sclerotic lesions. CT ABDOMEN PELVIS FINDINGS Hepatobiliary: Liver is slightly decreased in attenuation diffusely. Low-attenuation lesions in the liver measure up to 8 mm, too small to characterize, similar. Liver and gallbladder are otherwise unremarkable. No biliary ductal dilatation. Pancreas: Negative. Spleen: Negative. Adrenals/Urinary Tract: Heterogeneous right adrenal mass measures 2.9 x 3.8 cm and has been previously characterized as an adenoma. Left adrenal gland and right kidney are unremarkable. Subcentimeter low-attenuation lesions in the left kidney are too small to characterize. Kidneys are otherwise unremarkable. Ureters are decompressed. Bladder is grossly unremarkable. Stomach/Bowel: Stomach, small bowel and colon are unremarkable. Appendix is not visualized. Vascular/Lymphatic: Atherosclerotic calcification of the aorta. No pathologically enlarged lymph nodes. Reproductive: Uterus is visualized.  No adnexal mass. Other: No free fluid. Mesenteries and peritoneum are unremarkable. Small umbilical hernia contains fat. Musculoskeletal: Degenerative changes in the spine. No worrisome lytic or sclerotic lesions. IMPRESSION: 1. Post treatment changes in the right hemithorax. Progressive right middle lobe collapse may be due to associated bronchial narrowing. If there is concern for disease recurrence, PET is recommended. 2. Liver may be steatotic. 3. Right adrenal mass, previously  characterized as a benign adenoma. 4. Aortic atherosclerosis (ICD10-I70.0). Coronary artery calcification. 5.  Emphysema (ICD10-J43.9). Electronically Signed   By: Lorin Picket M.D.   On: 09/13/2021 11:49     ASSESSMENT & PLAN Caitriona Sundquist 70 y.o. female with medical history significant for limited stage Small Cell Lung cancer who presents for a follow up visit.  After review the labs, reviewed records, schedule patient the findings most consistent with a limited stage small cell lung cancer.  She is currently on therapy with atezolizumab and had recently requested to transition care from Dr. Julien Nordmann to myself.  We are happy to assume the care of this patient.  Previously we discussed the diagnosis of small cell lung cancer and the treatment of atezolizumab.  We explained the mechanism of action to the patient and her husband.  The patient noted that she is having some mental fogging for which she would like to discontinue this medication.  Strongly encourage her to reconsider and I believe that this is what is keeping the tumor from metastasizing and growing.  She has decided that she does not wish to pursue any further treatment with chemotherapy.  She would like to continue to follow in our clinic for surveillance monitoring and management of symptoms should they arise.  #Small Cell Lung Cancer, Recurrent Limited Stage --Patient notes that she would like to discontinue chemotherapy altogether.  She has missed numerous visits in between now and her last visit. --We will respect the patient's wishes and hold on future cycles of chemotherapy.  She voiced her understanding that this would results in the likely progression of her cancer and eventual early mortality --MRI brain last on 05/04/2021. Repeat later this year.  --last CT C/A/P on 9/23/222 showed stable disease with no clear evidence of progression --RTC in 3 months with CT scan   Orders Placed This Encounter  Procedures   CT CHEST ABDOMEN  PELVIS W  CONTRAST    Standing Status:   Future    Standing Expiration Date:   09/13/2022    Order Specific Question:   Preferred imaging location?    Answer:   [facility]    Order Specific Question:   Release to patient    Answer:   Immediate    Order Specific Question:   Is Oral Contrast requested for this exam?    Answer:   Yes, Per Radiology protocol    Order Specific Question:   Reason for Exam (SYMPTOM  OR DIAGNOSIS REQUIRED)    Answer:   small cell lung cancer, assess for progression    All questions were answered. The patient knows to call the clinic with any problems, questions or concerns.  A total of more than 30 minutes were spent on this encounter and over half of that time was spent on counseling and coordination of care as outlined above.   Ledell Peoples, MD Department of Hematology/Oncology [place] [facility] Phone: [phone] Pager: [phone] Email: [email or web address]  09/13/2021 4:48 PM

## 2021-09-15 ENCOUNTER — Other Ambulatory Visit: Payer: Self-pay | Admitting: Hematology and Oncology

## 2021-09-15 DIAGNOSIS — R350 Frequency of micturition: Secondary | ICD-10-CM | POA: Diagnosis not present

## 2021-09-15 MED ORDER — HYDROCODONE-ACETAMINOPHEN 7.5-325 MG/15ML PO SOLN: 10.0000 mL | Freq: Four times a day (QID) | ORAL | 0 refills | Status: DC | PRN

## 2021-10-04 ENCOUNTER — Other Ambulatory Visit: Payer: Self-pay | Admitting: Neurology

## 2021-10-05 ENCOUNTER — Telehealth: Payer: Self-pay | Admitting: Hematology and Oncology

## 2021-10-05 DIAGNOSIS — Z20822 Contact with and (suspected) exposure to covid-19: Secondary | ICD-10-CM | POA: Diagnosis not present

## 2021-10-05 DIAGNOSIS — R051 Acute cough: Secondary | ICD-10-CM | POA: Diagnosis not present

## 2021-10-05 NOTE — Telephone Encounter (Signed)
Rescheduled 10/21 appointment to 10/26 per provider pal. Patient has been called and notified.

## 2021-10-08 ENCOUNTER — Inpatient Hospital Stay: Payer: Medicare Other | Admitting: Hematology and Oncology

## 2021-10-08 ENCOUNTER — Inpatient Hospital Stay: Payer: Medicare Other

## 2021-10-13 ENCOUNTER — Inpatient Hospital Stay: Payer: Medicare Other | Admitting: Hematology and Oncology

## 2021-10-13 ENCOUNTER — Inpatient Hospital Stay: Payer: Medicare Other | Attending: Internal Medicine

## 2021-11-02 DIAGNOSIS — R32 Unspecified urinary incontinence: Secondary | ICD-10-CM | POA: Diagnosis not present

## 2021-11-02 DIAGNOSIS — R350 Frequency of micturition: Secondary | ICD-10-CM | POA: Diagnosis not present

## 2021-11-06 DIAGNOSIS — Z20828 Contact with and (suspected) exposure to other viral communicable diseases: Secondary | ICD-10-CM | POA: Diagnosis not present

## 2021-11-08 DIAGNOSIS — J441 Chronic obstructive pulmonary disease with (acute) exacerbation: Secondary | ICD-10-CM | POA: Diagnosis not present

## 2021-11-08 DIAGNOSIS — F17200 Nicotine dependence, unspecified, uncomplicated: Secondary | ICD-10-CM | POA: Diagnosis not present

## 2021-11-08 DIAGNOSIS — R296 Repeated falls: Secondary | ICD-10-CM | POA: Diagnosis not present

## 2021-11-08 DIAGNOSIS — N3941 Urge incontinence: Secondary | ICD-10-CM | POA: Diagnosis not present

## 2021-12-03 ENCOUNTER — Ambulatory Visit (HOSPITAL_COMMUNITY)
Admission: RE | Admit: 2021-12-03 | Discharge: 2021-12-03 | Disposition: A | Discharge status: Home / Self Care | Payer: Medicare Other | Source: Ambulatory Visit | Attending: Hematology and Oncology | Admitting: Hematology and Oncology

## 2021-12-03 ENCOUNTER — Telehealth: Payer: Self-pay | Admitting: *Deleted

## 2021-12-03 ENCOUNTER — Encounter (HOSPITAL_COMMUNITY): Payer: Self-pay

## 2021-12-03 ENCOUNTER — Other Ambulatory Visit: Payer: Self-pay

## 2021-12-03 DIAGNOSIS — C3491 Malignant neoplasm of unspecified part of right bronchus or lung: Secondary | ICD-10-CM | POA: Diagnosis not present

## 2021-12-03 LAB — POCT I-STAT CREATININE: Creatinine, Ser: 1.6 mg/dL — ABNORMAL HIGH (ref 0.44–1.00)

## 2021-12-03 MED ORDER — HEPARIN SOD (PORK) LOCK FLUSH 100 UNIT/ML IV SOLN
INTRAVENOUS | Status: AC
  Filled 2021-12-03: qty 5

## 2021-12-03 MED ORDER — SODIUM CHLORIDE (PF) 0.9 % IJ SOLN
INTRAMUSCULAR | Status: AC
  Filled 2021-12-03: qty 50

## 2021-12-03 NOTE — Telephone Encounter (Signed)
Received a call from [facility] in [place] stating pt did not have CT today due to diarrhea and other issue happening. Pt didn't want to continue. Advised they will reschedule her. Made provider aware.

## 2021-12-06 ENCOUNTER — Inpatient Hospital Stay: Payer: Medicare Other | Attending: Internal Medicine

## 2021-12-06 ENCOUNTER — Inpatient Hospital Stay: Payer: Medicare Other | Admitting: Hematology and Oncology

## 2021-12-14 ENCOUNTER — Other Ambulatory Visit: Payer: Medicaid Other

## 2021-12-14 ENCOUNTER — Ambulatory Visit: Payer: Medicaid Other | Admitting: Physician Assistant

## 2021-12-16 ENCOUNTER — Other Ambulatory Visit: Payer: Self-pay | Admitting: Physician Assistant

## 2021-12-16 DIAGNOSIS — R5382 Chronic fatigue, unspecified: Secondary | ICD-10-CM

## 2021-12-21 ENCOUNTER — Encounter: Payer: Self-pay | Admitting: Internal Medicine

## 2021-12-21 DIAGNOSIS — Z23 Encounter for immunization: Secondary | ICD-10-CM | POA: Diagnosis not present

## 2021-12-28 ENCOUNTER — Other Ambulatory Visit: Payer: Self-pay

## 2021-12-28 ENCOUNTER — Ambulatory Visit (HOSPITAL_COMMUNITY)
Admission: RE | Admit: 2021-12-28 | Discharge: 2021-12-28 | Disposition: A | Discharge status: Home / Self Care | Payer: Medicare Other | Source: Ambulatory Visit | Attending: Hematology and Oncology | Admitting: Hematology and Oncology

## 2021-12-28 DIAGNOSIS — J8410 Pulmonary fibrosis, unspecified: Secondary | ICD-10-CM | POA: Diagnosis not present

## 2021-12-28 DIAGNOSIS — I898 Other specified noninfective disorders of lymphatic vessels and lymph nodes: Secondary | ICD-10-CM | POA: Diagnosis not present

## 2021-12-28 DIAGNOSIS — C3490 Malignant neoplasm of unspecified part of unspecified bronchus or lung: Secondary | ICD-10-CM | POA: Diagnosis not present

## 2021-12-28 DIAGNOSIS — I2510 Atherosclerotic heart disease of native coronary artery without angina pectoris: Secondary | ICD-10-CM | POA: Diagnosis not present

## 2021-12-28 DIAGNOSIS — C3491 Malignant neoplasm of unspecified part of right bronchus or lung: Secondary | ICD-10-CM | POA: Diagnosis not present

## 2021-12-28 DIAGNOSIS — J439 Emphysema, unspecified: Secondary | ICD-10-CM | POA: Diagnosis not present

## 2021-12-28 DIAGNOSIS — I700 Atherosclerosis of aorta: Secondary | ICD-10-CM | POA: Diagnosis not present

## 2021-12-28 LAB — POCT I-STAT CREATININE: Creatinine, Ser: 1.2 mg/dL — ABNORMAL HIGH (ref 0.44–1.00)

## 2021-12-28 MED ORDER — IOHEXOL 350 MG/ML SOLN
80.0000 mL | Freq: Once | INTRAVENOUS | Status: AC | PRN
Start: 2021-12-28 — End: 2021-12-28
  Administered 2021-12-28: 80 mL via INTRAVENOUS

## 2021-12-28 MED ORDER — HEPARIN SOD (PORK) LOCK FLUSH 100 UNIT/ML IV SOLN
500.0000 [IU] | Freq: Once | INTRAVENOUS | Status: AC
  Administered 2021-12-28: 500 [IU] via INTRAVENOUS

## 2021-12-28 MED ORDER — HEPARIN SOD (PORK) LOCK FLUSH 100 UNIT/ML IV SOLN
INTRAVENOUS | Status: AC
  Filled 2021-12-28: qty 5

## 2021-12-30 ENCOUNTER — Telehealth: Payer: Self-pay

## 2021-12-30 ENCOUNTER — Telehealth: Payer: Self-pay | Admitting: Hematology and Oncology

## 2021-12-30 NOTE — Telephone Encounter (Signed)
-----   Message from Otila Kluver, RN sent at 12/30/2021  2:24 PM EST -----  ----- Message ----- From: Orson Slick, MD Sent: 12/29/2021   2:25 PM EST To: Otila Kluver, RN  Please let Tammy Bowers know that her CT scan shows no evidence of new or progressive disease. Please schedule her a clinic visit with [place] in the next 1-2 weeks.   ----- Message ----- From: Interface, Rad Results In Sent: 12/29/2021   7:37 AM EST To: Orson Slick, MD

## 2021-12-30 NOTE — Telephone Encounter (Signed)
Scheduled per 1/12 secure chat, pt has been called and confirmed

## 2021-12-30 NOTE — Telephone Encounter (Signed)
Pt advised with understanding.   Message to [facility] in scheduling to arrange lab and OV appt.

## 2022-01-10 DIAGNOSIS — J449 Chronic obstructive pulmonary disease, unspecified: Secondary | ICD-10-CM | POA: Diagnosis not present

## 2022-01-10 DIAGNOSIS — Z1211 Encounter for screening for malignant neoplasm of colon: Secondary | ICD-10-CM | POA: Diagnosis not present

## 2022-01-10 DIAGNOSIS — Z13820 Encounter for screening for osteoporosis: Secondary | ICD-10-CM | POA: Diagnosis not present

## 2022-01-10 DIAGNOSIS — N1832 Chronic kidney disease, stage 3b: Secondary | ICD-10-CM | POA: Diagnosis not present

## 2022-01-10 DIAGNOSIS — J441 Chronic obstructive pulmonary disease with (acute) exacerbation: Secondary | ICD-10-CM | POA: Diagnosis not present

## 2022-01-10 DIAGNOSIS — Z0000 Encounter for general adult medical examination without abnormal findings: Secondary | ICD-10-CM | POA: Diagnosis not present

## 2022-01-10 DIAGNOSIS — F5101 Primary insomnia: Secondary | ICD-10-CM | POA: Diagnosis not present

## 2022-01-10 DIAGNOSIS — E039 Hypothyroidism, unspecified: Secondary | ICD-10-CM | POA: Diagnosis not present

## 2022-01-10 DIAGNOSIS — C3401 Malignant neoplasm of right main bronchus: Secondary | ICD-10-CM | POA: Diagnosis not present

## 2022-01-10 DIAGNOSIS — F321 Major depressive disorder, single episode, moderate: Secondary | ICD-10-CM | POA: Diagnosis not present

## 2022-01-10 DIAGNOSIS — F17200 Nicotine dependence, unspecified, uncomplicated: Secondary | ICD-10-CM | POA: Diagnosis not present

## 2022-01-10 DIAGNOSIS — M1990 Unspecified osteoarthritis, unspecified site: Secondary | ICD-10-CM | POA: Diagnosis not present

## 2022-01-13 ENCOUNTER — Inpatient Hospital Stay: Payer: Medicare Other | Admitting: Hematology and Oncology

## 2022-01-13 ENCOUNTER — Inpatient Hospital Stay: Payer: Medicare Other | Attending: Internal Medicine

## 2022-01-18 ENCOUNTER — Other Ambulatory Visit: Payer: Self-pay | Admitting: Internal Medicine

## 2022-01-18 DIAGNOSIS — Z13820 Encounter for screening for osteoporosis: Secondary | ICD-10-CM

## 2022-02-14 DIAGNOSIS — Z20822 Contact with and (suspected) exposure to covid-19: Secondary | ICD-10-CM | POA: Diagnosis not present

## 2022-02-16 DIAGNOSIS — Z20822 Contact with and (suspected) exposure to covid-19: Secondary | ICD-10-CM | POA: Diagnosis not present

## 2022-03-08 ENCOUNTER — Institutional Professional Consult (permissible substitution): Payer: Medicaid Other | Admitting: Pulmonary Disease

## 2022-03-22 DIAGNOSIS — R0789 Other chest pain: Secondary | ICD-10-CM | POA: Diagnosis not present

## 2022-03-22 DIAGNOSIS — N1832 Chronic kidney disease, stage 3b: Secondary | ICD-10-CM | POA: Diagnosis not present

## 2022-03-22 DIAGNOSIS — R413 Other amnesia: Secondary | ICD-10-CM | POA: Diagnosis not present

## 2022-03-22 DIAGNOSIS — R269 Unspecified abnormalities of gait and mobility: Secondary | ICD-10-CM | POA: Diagnosis not present

## 2022-03-22 DIAGNOSIS — R296 Repeated falls: Secondary | ICD-10-CM | POA: Diagnosis not present

## 2022-03-25 DIAGNOSIS — Z20822 Contact with and (suspected) exposure to covid-19: Secondary | ICD-10-CM | POA: Diagnosis not present

## 2022-03-29 ENCOUNTER — Encounter: Payer: Self-pay | Admitting: Neurology

## 2022-03-29 ENCOUNTER — Ambulatory Visit (INDEPENDENT_AMBULATORY_CARE_PROVIDER_SITE_OTHER): Payer: Medicare Other | Admitting: Neurology

## 2022-03-29 VITALS — BP 109/77 | HR 106 | Ht 66.0 in | Wt 179.5 lb

## 2022-03-29 DIAGNOSIS — C3491 Malignant neoplasm of unspecified part of right bronchus or lung: Secondary | ICD-10-CM | POA: Diagnosis not present

## 2022-03-29 DIAGNOSIS — R9089 Other abnormal findings on diagnostic imaging of central nervous system: Secondary | ICD-10-CM | POA: Diagnosis not present

## 2022-03-29 DIAGNOSIS — R296 Repeated falls: Secondary | ICD-10-CM | POA: Insufficient documentation

## 2022-03-29 DIAGNOSIS — G939 Disorder of brain, unspecified: Secondary | ICD-10-CM | POA: Diagnosis not present

## 2022-03-29 DIAGNOSIS — C3400 Malignant neoplasm of unspecified main bronchus: Secondary | ICD-10-CM | POA: Insufficient documentation

## 2022-03-29 DIAGNOSIS — R29818 Other symptoms and signs involving the nervous system: Secondary | ICD-10-CM | POA: Diagnosis not present

## 2022-03-29 DIAGNOSIS — R32 Unspecified urinary incontinence: Secondary | ICD-10-CM | POA: Insufficient documentation

## 2022-03-29 DIAGNOSIS — G43909 Migraine, unspecified, not intractable, without status migrainosus: Secondary | ICD-10-CM | POA: Insufficient documentation

## 2022-03-29 DIAGNOSIS — G43709 Chronic migraine without aura, not intractable, without status migrainosus: Secondary | ICD-10-CM

## 2022-03-29 DIAGNOSIS — R4701 Aphasia: Secondary | ICD-10-CM | POA: Insufficient documentation

## 2022-03-29 DIAGNOSIS — N1832 Chronic kidney disease, stage 3b: Secondary | ICD-10-CM | POA: Insufficient documentation

## 2022-03-29 DIAGNOSIS — R269 Unspecified abnormalities of gait and mobility: Secondary | ICD-10-CM | POA: Insufficient documentation

## 2022-03-29 MED ORDER — ALPRAZOLAM 0.5 MG PO TABS
ORAL_TABLET | ORAL | 0 refills | Status: DC
Start: 2022-03-29 — End: 2022-08-01

## 2022-03-29 MED ORDER — TOPIRAMATE 50 MG PO TABS
ORAL_TABLET | ORAL | 1 refills | Status: DC
Start: 2022-03-29 — End: 2022-09-26

## 2022-03-29 NOTE — Progress Notes (Signed)
? ? ?Patient: Tammy Bowers ?Date of Birth: 05/07/1951 ? ?Reason for Visit: Follow up for migraine headache, episodic left-sided numbness, right frontal region lesion by MRI ?History from: Patient ?Primary Neurologist: Dr. Leta Baptist  ? ?ASSESSMENT AND PLAN ?71 y.o. year old female  ? ?1.  History of lung cancer ?2.  History of whole brain radiation ?3.  History of migraine headache ?4.  Episodic left-sided numbness, migraine vs seizures? ?5.  Abnormal MRI of the brain, venous abnormality to the right brain, new on MRI of the brain in May 2022 ? ?-Recheck MRI of the brain with and without contrast for recheck on right sided frontal lesion, likely venous anomaly, metastasis felt unlikely; imaging recheck also report of worsening gait, balance ?-Continue Topamax for headache prevention, left-sided paresthesia ?-Will be given Xanax for claustrophobia prior to MRI, will check creatinine ?-Follow-up in 6 months or sooner if needed, will now be followed by Dr. Leta Baptist  ? ?HISTORY OF PRESENT ILLNESS: ?Today 03/29/22 ?Tammy Bowers here today for follow-up.  Had MRI of the brain in May 2022 showing slight progression of cerebrovascular disease associated with whole brain radiation, there was a new venous abnormality in the right brain, no evidence of metastasis.  She has history of migraines.  Has episodes of numbness starting with the left hand, then the entire left side.  She is on Topamax, when taken consistently this prevents the paresthesias spread. Feels the sensation to walk doesn't travel to her feet, she shuffles, ebbs and flows. Has chronic gait instability, prone to falls, using walker. In remission from lung cancer, last treatment was 6-12 months was immunotherapy. On Chantix for smoking cessation, since starting reports hallucinations, on for 3 weeks. ? ?HISTORY  ?04/15/2021 Dr. Jannifer Franklin: Tammy Bowers is a 71 year old left-handed white female with a history of lung cancer.  She has longstanding history of migraine  headaches as well.  She has had episodes relatively frequently involving a sensory spread of numbness of the left hand that gradually spreads within about a minute to include the entire arm and then goes down the left leg.  At times, she may have problems with aphasia.  The patient was to be set up for MRI of the brain but this was never done.  The patient has a history of whole brain radiation.  Recently, she has gone off of her chemotherapy agents for her lung cancer.  She denies any new numbness or weakness, she has not had any falls.  She does have 1 or 2 headaches a week.  If she remembers to take her Topamax, she prevents the left sensory events from occurring, and her husband indicates that she processes information better on the medication.  She returns for an evaluation.  ? ?REVIEW OF SYSTEMS: Out of a complete 14 system review of symptoms, the patient complains only of the following symptoms, and all other reviewed systems are negative. ? ?See HPI ? ?ALLERGIES: ?Allergies  ?Allergen Reactions  ? Lidocaine Swelling  ?  SWELLING REACTION UNSPECIFIED   ? ? ?HOME MEDICATIONS: ?Outpatient Medications Prior to Visit  ?Medication Sig Dispense Refill  ? acetaminophen-codeine (TYLENOL #3) 300-30 MG tablet Take 1 tablet by mouth every 8 (eight) hours as needed for moderate pain. 21 tablet 0  ? albuterol (PROVENTIL HFA;VENTOLIN HFA) 108 (90 BASE) MCG/ACT inhaler Inhale 2 puffs into the lungs every 4 (four) hours as needed for wheezing.    ? clonazePAM (KLONOPIN) 0.5 MG tablet     ? cyclobenzaprine (FLEXERIL) 5 MG tablet  Take 1 tablet by mouth 3 times/day as needed-between meals & bedtime.    ? diphenoxylate-atropine (LOMOTIL) 2.5-0.025 MG tablet Take 1 tablet by mouth 4 (four) times daily as needed for diarrhea or loose stools. 30 tablet 0  ? Fluticasone-Salmeterol (ADVAIR) 500-50 MCG/DOSE AEPB Inhale 1 puff into the lungs 2 (two) times daily.    ? guaiFENesin-codeine (ROBITUSSIN AC) 100-10 MG/5ML syrup Take 5 mLs by  mouth 3 (three) times daily as needed for cough. 120 mL 0  ? HYDROcodone-acetaminophen (HYCET) 7.5-325 mg/15 ml solution Take 10 mLs by mouth 4 (four) times daily as needed for moderate pain (use before meals and at bedtime prn). 473 mL 0  ? ibuprofen (ADVIL) 800 MG tablet Take 800 mg by mouth every 8 (eight) hours as needed.    ? levothyroxine (SYNTHROID) 112 MCG tablet TAKE 1 TABLET(112 MCG) BY MOUTH DAILY BEFORE AND BREAKFAST 90 tablet 1  ? Multiple Vitamin (MULTIVITAMIN WITH MINERALS) TABS tablet Take 1 tablet by mouth daily.     ? OVER THE COUNTER MEDICATION 1 capsule. [place] tail-"mushroom in a capsule"     ? prochlorperazine (COMPAZINE) 10 MG tablet TAKE 1 TABLET(10 MG) BY MOUTH EVERY 6 HOURS AS NEEDED FOR NAUSEA OR VOMITING 30 tablet 0  ? varenicline (CHANTIX PAK) 0.5 MG X 11 & 1 MG X 42 tablet Take 2 tablets by mouth daily.    ? ALPRAZolam (XANAX) 0.5 MG tablet Take 2 tablets approximately 45 minutes prior to the MRI study, take a third tablet if needed. 3 tablet 0  ? topiramate (TOPAMAX) 50 MG tablet TAKE 1 TABLET(50 MG) BY MOUTH TWICE DAILY 180 tablet 1  ? ?No facility-administered medications prior to visit.  ? ? ?PAST MEDICAL HISTORY: ?Past Medical History:  ?Diagnosis Date  ? Adenopathy 10/10/2016  ? PERICARINAL  ? Anemia   ? Arthritis   ? Asthma   ? COPD (chronic obstructive pulmonary disease) ([place])   ? Dyspnea   ? Headache   ? migraines  ? Hilar mass 10/10/2016  ? RIGHT  ? History of kidney stones   ? Insomnia   ? Lung nodules 10/10/2016  ? RIGHT LOWER LOBE  ? Mass of both adrenal glands ([place]) 10/10/2016  ? Mood swing   ? Pneumonia   ? SCL CA dx'd 2017  ? lung cancer Small Cell; recurrence 10/2018  ? Spinal headache   ? Substance abuse ([place])   ? TOBACCO  ? UNSPECIFIED INFECTION OF BONE ANKLE AND FOOT 10/22/2009  ? Annotation: left ankle Qualifier: Diagnosis of  By: Gustavo Lah    ? ? ?PAST SURGICAL HISTORY: ?Past Surgical History:  ?Procedure Laterality Date  ? ANKLE SURGERY    ? hardware  removal, and initial ankle surgery  ? APPENDECTOMY    ? IR IMAGING GUIDED PORT INSERTION  04/12/2019  ? LUNG BIOPSY N/A 10/19/2016  ? Procedure: LUNG BIOPSY;  Surgeon: Grace Isaac, MD;  Location: [facility];  Service: Thoracic;  Laterality: N/A;  ? LUNG BIOPSY N/A 02/20/2019  ? Procedure: LUNG BIOPSY;  Surgeon: Grace Isaac, MD;  Location: [facility];  Service: Thoracic;  Laterality: N/A;  ? TONSILLECTOMY    ? VIDEO BRONCHOSCOPY WITH ENDOBRONCHIAL ULTRASOUND N/A 10/19/2016  ? Procedure: VIDEO BRONCHOSCOPY WITH ENDOBRONCHIAL ULTRASOUND;  Surgeon: Grace Isaac, MD;  Location: [place];  Service: Thoracic;  Laterality: N/A;  ? VIDEO BRONCHOSCOPY WITH ENDOBRONCHIAL ULTRASOUND N/A 02/20/2019  ? Procedure: VIDEO BRONCHOSCOPY WITH ENDOBRONCHIAL ULTRASOUND;  Surgeon: Grace Isaac, MD;  Location: [facility];  Service: Thoracic;  Laterality: N/A;  ? ? ?FAMILY HISTORY: ?Family History  ?Problem Relation Age of Onset  ? Heart disease Father   ? Colon cancer Father   ? Heart disease Paternal Grandfather   ? Ovarian cancer Mother   ? ? ?SOCIAL HISTORY: ?Social History  ? ?Socioeconomic History  ? Marital status: Married  ?  Spouse name: Jenny Reichmann  ? Number of children: 2  ? Years of education: Not on file  ? Highest education level: Not on file  ?Occupational History  ? Occupation: retired  ?Tobacco Use  ? Smoking status: Every Day  ?  Packs/day: 1.00  ?  Years: 50.00  ?  Pack years: 50.00  ?  Types: Cigarettes  ? Smokeless tobacco: Never  ?Vaping Use  ? Vaping Use: Never used  ?Substance and Sexual Activity  ? Alcohol use: Yes  ?  Alcohol/week: 7.0 standard drinks  ?  Types: 7 Shots of liquor per week  ? Drug use: Not Currently  ?  Types: Marijuana  ?  Comment: August/Sept 2017 last ttime  ? Sexual activity: Yes  ?  Partners: Male  ?  Birth control/protection: Post-menopausal  ?Other Topics Concern  ? Not on file  ?Social History Narrative  ? Lives with husband Jenny Reichmann  ? Left Handed  ? Drinks 3-4 cups daily  ? ?Social Determinants of  Health  ? ?Financial Resource Strain: Not on file  ?Food Insecurity: Not on file  ?Transportation Needs: Not on file  ?Physical Activity: Not on file  ?Stress: Not on file  ?Social Connections: Not on file  ?[place]

## 2022-03-29 NOTE — Patient Instructions (Signed)
Continue Topamax ?Check MRI of the brain  ?Check creatinine ?Return back in 6 months ?Will see Dr. Leta Baptist  ? ?

## 2022-03-30 ENCOUNTER — Encounter: Payer: Self-pay | Admitting: Internal Medicine

## 2022-03-30 ENCOUNTER — Telehealth: Payer: Self-pay | Admitting: Neurology

## 2022-03-30 LAB — CREATININE, SERUM
Creatinine, Ser: 1.18 mg/dL — ABNORMAL HIGH (ref 0.57–1.00)
eGFR: 50 mL/min/{1.73_m2} — ABNORMAL LOW (ref >59)

## 2022-03-30 NOTE — Telephone Encounter (Signed)
Medicare/medicaid order sent to [facility], [facility] they will reach out to the patient to schedule.  ?

## 2022-03-31 ENCOUNTER — Telehealth: Payer: Self-pay | Admitting: *Deleted

## 2022-03-31 NOTE — Telephone Encounter (Signed)
-----   Message from Suzzanne Cloud, NP sent at 03/31/2022  3:32 PM EDT ----- ?Kidney function okay for MRI with contrast but make sure she drinks plenty of water before and after.  ?

## 2022-03-31 NOTE — Telephone Encounter (Signed)
I spoke to the patient and notified her of the results. Agreeable to drink plenty of water.  ? ?  ?

## 2022-04-13 ENCOUNTER — Institutional Professional Consult (permissible substitution): Payer: Medicaid Other | Admitting: Pulmonary Disease

## 2022-04-21 ENCOUNTER — Ambulatory Visit
Admission: RE | Admit: 2022-04-21 | Discharge: 2022-04-21 | Disposition: A | Discharge status: Home / Self Care | Payer: Medicare Other | Source: Ambulatory Visit | Attending: Neurology | Admitting: Neurology

## 2022-04-21 ENCOUNTER — Encounter: Payer: Self-pay | Admitting: Internal Medicine

## 2022-04-21 DIAGNOSIS — G939 Disorder of brain, unspecified: Secondary | ICD-10-CM

## 2022-04-21 MED ORDER — GADOBENATE DIMEGLUMINE 529 MG/ML IV SOLN
15.0000 mL | Freq: Once | INTRAVENOUS | Status: AC | PRN
  Administered 2022-04-21: 15 mL via INTRAVENOUS

## 2022-04-25 DIAGNOSIS — Z20822 Contact with and (suspected) exposure to covid-19: Secondary | ICD-10-CM | POA: Diagnosis not present

## 2022-04-26 ENCOUNTER — Telehealth: Payer: Self-pay | Admitting: Neurology

## 2022-04-26 NOTE — Telephone Encounter (Signed)
I spoke to the patient and reviewed the MRI brain as outlined below. She verbalized understanding of the findings. Agreeable to continue topiramate and keep her pending follow up on 10/04/22. She will call our office with any concerns prior to this date.  ? ?She did admit today that she has started smoking again. She is calling her PCP to discuss a cessation plan.  ?

## 2022-04-26 NOTE — Telephone Encounter (Signed)
Left message for a return call

## 2022-04-26 NOTE — Telephone Encounter (Signed)
Please call the patient, reviewed MRI brain film with Dr. Leta Baptist, does not appear to show any mets, potential incidental finding of venous anomaly in the right frontal area. The spot of question to the right frontal area seen in MRI in May 2022 is better, possibly due to slice differences. It is possible the symptoms of left sided paresthesia are partial seizures, I would remain on Topamax, keep follow-up appointment with Dr. Leta Baptist.  I will route this to her oncologist, who mentioned repeating MRI  brain end on 2022.  ? ? 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 There are advanced changes of subcortical and brainstem microvascular disease likely due to effects of prior brain radiation ?

## 2022-05-19 ENCOUNTER — Ambulatory Visit (INDEPENDENT_AMBULATORY_CARE_PROVIDER_SITE_OTHER): Payer: Medicare Other | Admitting: Pulmonary Disease

## 2022-05-19 ENCOUNTER — Encounter: Payer: Self-pay | Admitting: Pulmonary Disease

## 2022-05-19 DIAGNOSIS — J432 Centrilobular emphysema: Secondary | ICD-10-CM

## 2022-05-19 DIAGNOSIS — Z720 Tobacco use: Secondary | ICD-10-CM | POA: Diagnosis not present

## 2022-05-19 NOTE — Assessment & Plan Note (Signed)
Schedule PFTs. Based on this we will escalate from her current regimen of Advair to triple therapy Continue albuterol as needed. She would benefit from pulmonary rehab once she is able to get over her balance issues

## 2022-05-19 NOTE — Patient Instructions (Signed)
  X schedule pFTs  X Ambulatory sat  Try to QUIT smoking !

## 2022-05-19 NOTE — Progress Notes (Signed)
Subjective:    Patient ID: Tammy Bowers, female    DOB: 01-27-1951, 71 y.o.   MRN: [id number]  HPI  71 year old smoker presents to establish care for COPD She was accompanied by her husband Jenny Reichmann who corroborates history. Chart is reviewed for oncology records, including last consultation note by Dr. Lorenso Courier  PMH -Limited stage small cell lung cancer diagnosed 02/2017 Recurrence 02/2019 status post chemoradiation , then started on immunotherapy which was discontinued 04/2021 she transferred her care from [facility] to Dr. Lorenso Courier.  Due to side effects of immunotherapy including mental fog, she discontinued and has been taking [place] tail mushrooms  -Recurrent falls requiring walker?  Neuropathy  She reports dyspnea on exertion, dyspnea also occurs at night, she has to sleep on her side.  She reports a nonproductive cough She smokes about 15 cigarettes/day -she has tried Chantix and nicotine patches and tried to cut down No hospitalizations or ED visits in the last year   Significant tests/ events reviewed  On ambulation, saturation dropped from 95 to 93%  MRI brain 04/2022 no metastases 12/2021 CT chest/abdomen/pelvis -perihilar fibrosis and consolidation, atelectasis of right middle lobe , small calcified pretracheal, subcarinal and right hilar lymph nodes.  Fine centrilobular nodules at the lung apices?  RB ILD , emphysema    Past Medical History:  Diagnosis Date   Adenopathy 10/10/2016   PERICARINAL   Anemia    Arthritis    Asthma    COPD (chronic obstructive pulmonary disease) (HCC)    Dyspnea    Headache    migraines   Hilar mass 10/10/2016   RIGHT   History of kidney stones    Insomnia    Lung nodules 10/10/2016   RIGHT LOWER LOBE   Mass of both adrenal glands ([place]) 10/10/2016   Mood swing    Pneumonia    SCL CA dx'd 2017   lung cancer Small Cell; recurrence 10/2018   Spinal headache    Substance abuse ([place])    TOBACCO   UNSPECIFIED INFECTION OF BONE ANKLE AND FOOT  10/22/2009   Annotation: left ankle Qualifier: Diagnosis of  By: Gustavo Lah     Past Surgical History:  Procedure Laterality Date   ANKLE SURGERY     hardware removal, and initial ankle surgery   APPENDECTOMY     IR IMAGING GUIDED PORT INSERTION  04/12/2019   LUNG BIOPSY N/A 10/19/2016   Procedure: LUNG BIOPSY;  Surgeon: Grace Isaac, MD;  Location: [place];  Service: Thoracic;  Laterality: N/A;   LUNG BIOPSY N/A 02/20/2019   Procedure: LUNG BIOPSY;  Surgeon: Grace Isaac, MD;  Location: [place];  Service: Thoracic;  Laterality: N/A;   TONSILLECTOMY     VIDEO BRONCHOSCOPY WITH ENDOBRONCHIAL ULTRASOUND N/A 10/19/2016   Procedure: VIDEO BRONCHOSCOPY WITH ENDOBRONCHIAL ULTRASOUND;  Surgeon: Grace Isaac, MD;  Location: [place];  Service: Thoracic;  Laterality: N/A;   VIDEO BRONCHOSCOPY WITH ENDOBRONCHIAL ULTRASOUND N/A 02/20/2019   Procedure: VIDEO BRONCHOSCOPY WITH ENDOBRONCHIAL ULTRASOUND;  Surgeon: Grace Isaac, MD;  Location: [facility];  Service: Thoracic;  Laterality: N/A;    Allergies  Allergen Reactions   Lidocaine Swelling    SWELLING REACTION UNSPECIFIED     Social History   Socioeconomic History   Marital status: Married    Spouse name: [name]   Number of children: 2   Years of education: Not on file   Highest education level: Not on file  Occupational History   Occupation: retired  Tobacco  Use   Smoking status: Every Day    Packs/day: 1.00    Years: 50.00    Pack years: 50.00    Types: Cigarettes   Smokeless tobacco: Never   Tobacco comments:    Smokes 10 - 15 cigarettes a day ARJ 05/19/22  Vaping Use   Vaping Use: Never used  Substance and Sexual Activity   Alcohol use: Yes    Alcohol/week: 7.0 standard drinks    Types: 7 Shots of liquor per week   Drug use: Not Currently    Types: Marijuana    Comment: August/Sept 2017 last ttime   Sexual activity: Yes    Partners: Male    Birth control/protection: Post-menopausal  Other Topics Concern    Not on file  Social History Narrative   Lives with husband [name]   Left Handed   Drinks 3-4 cups daily   Social Determinants of Health   Financial Resource Strain: Not on file  Food Insecurity: Not on file  Transportation Needs: Not on file  Physical Activity: Not on file  Stress: Not on file  Social Connections: Not on file  Intimate Partner Violence: Not on file    Family History  Problem Relation Age of Onset   Heart disease Father    Colon cancer Father    Heart disease Paternal Grandfather    Ovarian cancer Mother       Review of Systems Shortness of breath with activity Nonproductive cough Indigestion Chest pain  Constitutional: negative for anorexia, fevers and sweats  Eyes: negative for irritation, redness and visual disturbance  Ears, nose, mouth, throat, and face: negative for earaches, epistaxis, nasal congestion and sore throat  Respiratory: negative for  sputum and wheezing  Cardiovascular: negative for lower extremity edema, orthopnea, palpitations and syncope  Gastrointestinal: negative for abdominal pain, constipation, diarrhea, melena, nausea and vomiting  Genitourinary:negative for dysuria, frequency and hematuria  Hematologic/lymphatic: negative for bleeding, easy bruising and lymphadenopathy  Musculoskeletal:negative for arthralgias, muscle weakness and stiff joints  Neurological: negative for coordination problems, gait problems, headaches and weakness  Endocrine: negative for diabetic symptoms including polydipsia, polyuria and weight loss     Objective:   Physical Exam  Gen. Pleasant, well-nourished, in no distress, normal affect ENT - no pallor,icterus, no post nasal drip, alopecia Neck: No JVD, no thyromegaly, no carotid bruits Lungs: no use of accessory muscles, no dullness to percussion, decreased breath sounds bilateral coarse without rales or rhonchi  Cardiovascular: Rhythm regular, heart sounds  normal, no murmurs or gallops, no  peripheral edema Abdomen: soft and non-tender, no hepatosplenomegaly, BS normal. Musculoskeletal: No deformities, no cyanosis or clubbing Neuro: Appears distracted, non focal       Assessment & Plan:

## 2022-05-19 NOTE — Assessment & Plan Note (Signed)
Smoking cessation advised as the most important intervention that would add years to her life. She will continue on Chantix and use nicotine patches.  She was not willing to commit to a quit date Changes of RB ILD noted on CT scan likely due to smoking

## 2022-06-24 DIAGNOSIS — H40033 Anatomical narrow angle, bilateral: Secondary | ICD-10-CM | POA: Diagnosis not present

## 2022-06-24 DIAGNOSIS — H2513 Age-related nuclear cataract, bilateral: Secondary | ICD-10-CM | POA: Diagnosis not present

## 2022-07-11 ENCOUNTER — Other Ambulatory Visit: Payer: Self-pay

## 2022-07-11 DIAGNOSIS — H6123 Impacted cerumen, bilateral: Secondary | ICD-10-CM | POA: Diagnosis not present

## 2022-07-11 DIAGNOSIS — J449 Chronic obstructive pulmonary disease, unspecified: Secondary | ICD-10-CM | POA: Diagnosis not present

## 2022-07-11 DIAGNOSIS — R296 Repeated falls: Secondary | ICD-10-CM | POA: Diagnosis not present

## 2022-07-11 DIAGNOSIS — N1832 Chronic kidney disease, stage 3b: Secondary | ICD-10-CM | POA: Diagnosis not present

## 2022-07-11 DIAGNOSIS — R6882 Decreased libido: Secondary | ICD-10-CM | POA: Diagnosis not present

## 2022-07-11 DIAGNOSIS — C3401 Malignant neoplasm of right main bronchus: Secondary | ICD-10-CM | POA: Diagnosis not present

## 2022-07-11 DIAGNOSIS — E039 Hypothyroidism, unspecified: Secondary | ICD-10-CM | POA: Diagnosis not present

## 2022-07-11 DIAGNOSIS — E7800 Pure hypercholesterolemia, unspecified: Secondary | ICD-10-CM | POA: Diagnosis not present

## 2022-07-18 ENCOUNTER — Telehealth: Payer: Self-pay | Admitting: *Deleted

## 2022-07-18 NOTE — Telephone Encounter (Signed)
Received vm message from pt's husband. He is asking to have his wife's port removed and a CT scan. Pt has not been seen by Dr. Lorenso Courier since 09/13/21.  Please advise

## 2022-07-18 NOTE — Telephone Encounter (Signed)
TCT patient's husband, Jenny Reichmann. Spoke to him. Advised that as pt has not been seen here in almost a year, she needs to be seen by Dr. Lorenso Courier 1st. Husband voiced understanding. Asked him if pt has had her port flushed in the last year. He said it has not been flushed.   Scheduling message sent for port flush with labs and MD appt. They prefer appt times after 12noon.

## 2022-07-19 ENCOUNTER — Telehealth: Payer: Self-pay | Admitting: Hematology and Oncology

## 2022-07-19 NOTE — Telephone Encounter (Signed)
Scheduled per 7/31 in basket, message has been left

## 2022-07-22 ENCOUNTER — Emergency Department (HOSPITAL_COMMUNITY)
Admission: EM | Admit: 2022-07-22 | Discharge: 2022-07-22 | Disposition: A | Discharge status: Home / Self Care | Payer: Medicare Other | Attending: Emergency Medicine | Admitting: Emergency Medicine

## 2022-07-22 ENCOUNTER — Emergency Department (HOSPITAL_COMMUNITY): Payer: Medicare Other

## 2022-07-22 DIAGNOSIS — S12000A Unspecified displaced fracture of first cervical vertebra, initial encounter for closed fracture: Secondary | ICD-10-CM | POA: Insufficient documentation

## 2022-07-22 DIAGNOSIS — S12112A Nondisplaced Type II dens fracture, initial encounter for closed fracture: Secondary | ICD-10-CM | POA: Diagnosis not present

## 2022-07-22 DIAGNOSIS — Z85118 Personal history of other malignant neoplasm of bronchus and lung: Secondary | ICD-10-CM | POA: Insufficient documentation

## 2022-07-22 DIAGNOSIS — Z23 Encounter for immunization: Secondary | ICD-10-CM | POA: Insufficient documentation

## 2022-07-22 DIAGNOSIS — J449 Chronic obstructive pulmonary disease, unspecified: Secondary | ICD-10-CM | POA: Insufficient documentation

## 2022-07-22 DIAGNOSIS — S0003XA Contusion of scalp, initial encounter: Secondary | ICD-10-CM | POA: Diagnosis not present

## 2022-07-22 DIAGNOSIS — S0990XA Unspecified injury of head, initial encounter: Secondary | ICD-10-CM | POA: Diagnosis not present

## 2022-07-22 DIAGNOSIS — J45909 Unspecified asthma, uncomplicated: Secondary | ICD-10-CM | POA: Diagnosis not present

## 2022-07-22 DIAGNOSIS — Z7951 Long term (current) use of inhaled steroids: Secondary | ICD-10-CM | POA: Insufficient documentation

## 2022-07-22 DIAGNOSIS — W01198A Fall on same level from slipping, tripping and stumbling with subsequent striking against other object, initial encounter: Secondary | ICD-10-CM | POA: Insufficient documentation

## 2022-07-22 DIAGNOSIS — S12100A Unspecified displaced fracture of second cervical vertebra, initial encounter for closed fracture: Secondary | ICD-10-CM | POA: Insufficient documentation

## 2022-07-22 DIAGNOSIS — S0093XA Contusion of unspecified part of head, initial encounter: Secondary | ICD-10-CM | POA: Insufficient documentation

## 2022-07-22 DIAGNOSIS — G8929 Other chronic pain: Secondary | ICD-10-CM | POA: Diagnosis not present

## 2022-07-22 DIAGNOSIS — S12090A Other displaced fracture of first cervical vertebra, initial encounter for closed fracture: Secondary | ICD-10-CM

## 2022-07-22 DIAGNOSIS — S169XXA Unspecified injury of muscle, fascia and tendon at neck level, initial encounter: Secondary | ICD-10-CM | POA: Diagnosis present

## 2022-07-22 DIAGNOSIS — W19XXXA Unspecified fall, initial encounter: Secondary | ICD-10-CM | POA: Diagnosis not present

## 2022-07-22 DIAGNOSIS — M4312 Spondylolisthesis, cervical region: Secondary | ICD-10-CM | POA: Diagnosis not present

## 2022-07-22 DIAGNOSIS — S12120A Other displaced dens fracture, initial encounter for closed fracture: Secondary | ICD-10-CM | POA: Diagnosis not present

## 2022-07-22 DIAGNOSIS — S12111A Posterior displaced Type II dens fracture, initial encounter for closed fracture: Secondary | ICD-10-CM | POA: Diagnosis not present

## 2022-07-22 MED ORDER — HYDROCODONE-ACETAMINOPHEN 5-325 MG PO TABS
1.0000 | ORAL_TABLET | Freq: Once | ORAL | Status: AC
  Administered 2022-07-22: 1 via ORAL
  Filled 2022-07-22: qty 1

## 2022-07-22 MED ORDER — TETANUS-DIPHTH-ACELL PERTUSSIS 5-2.5-18.5 LF-MCG/0.5 IM SUSY
0.5000 mL | PREFILLED_SYRINGE | Freq: Once | INTRAMUSCULAR | Status: AC
  Administered 2022-07-22: 0.5 mL via INTRAMUSCULAR
  Filled 2022-07-22: qty 0.5

## 2022-07-22 MED ORDER — OXYCODONE-ACETAMINOPHEN 5-325 MG PO TABS
1.0000 | ORAL_TABLET | Freq: Four times a day (QID) | ORAL | 0 refills | Status: DC | PRN
Start: 2022-07-22 — End: 2022-09-26

## 2022-07-22 NOTE — Discharge Instructions (Signed)
You were seen today and have a fracture of your cervical spine.  You need to keep the c-collar in place at all times.  Follow-up with neurosurgery in 2 weeks.  If you develop weakness, numbness, tingling of any of your extremities, you should be reevaluated immediately.

## 2022-07-22 NOTE — ED Provider Notes (Signed)
[facility] Provider Note   CSN: [id number] Arrival date & time: 07/22/22  0304     History  No chief complaint on file.   Tammy Bowers is a 71 y.o. female.  HPI     This is a 71 year old female who presents following a fall.  She reports a mechanical fall.  She states that she lost her balance falling and striking her head.  She is not on any blood thinners.  She is reporting neck pain.  No weakness numbness, or tingling in the upper extremities.  She has been ambulatory.  Denies chest pain, shortness of breath.  Denies extremity pain.  Home Medications Prior to Admission medications   Medication Sig Start Date End Date Taking? Authorizing Provider  oxyCODONE-acetaminophen (PERCOCET/ROXICET) 5-325 MG tablet Take 1 tablet by mouth every 6 (six) hours as needed for severe pain. 07/22/22  Yes Kadee Philyaw, Barbette Hair, MD  acetaminophen-codeine (TYLENOL #3) 300-30 MG tablet Take 1 tablet by mouth every 8 (eight) hours as needed for moderate pain. 09/13/21   Orson Slick, MD  albuterol (PROVENTIL HFA;VENTOLIN HFA) 108 (90 BASE) MCG/ACT inhaler Inhale 2 puffs into the lungs every 4 (four) hours as needed for wheezing.    [provider]  ALPRAZolam Duanne Moron) 0.5 MG tablet Take 2 tablets approximately 45 minutes prior to the MRI study, take a third tablet if needed. 03/29/22   Suzzanne Cloud, NP  clonazePAM Bobbye Charleston) 0.5 MG tablet  10/04/20   [provider]  cyclobenzaprine (FLEXERIL) 5 MG tablet Take 1 tablet by mouth 3 times/day as needed-between meals & bedtime.    [provider]  diphenoxylate-atropine (LOMOTIL) 2.5-0.025 MG tablet Take 1 tablet by mouth 4 (four) times daily as needed for diarrhea or loose stools. 03/30/19   Wyatt Portela, MD  Fluticasone-Salmeterol (ADVAIR) 500-50 MCG/DOSE AEPB Inhale 1 puff into the lungs 2 (two) times daily.    [provider]  guaiFENesin-codeine (ROBITUSSIN AC) 100-10 MG/5ML  syrup Take 5 mLs by mouth 3 (three) times daily as needed for cough. 09/13/21   Orson Slick, MD  HYDROcodone-acetaminophen (HYCET) 7.5-325 mg/15 ml solution Take 10 mLs by mouth 4 (four) times daily as needed for moderate pain (use before meals and at bedtime prn). 09/15/21   Orson Slick, MD  ibuprofen (ADVIL) 800 MG tablet Take 800 mg by mouth every 8 (eight) hours as needed.    [provider]  levothyroxine (SYNTHROID) 112 MCG tablet TAKE 1 TABLET(112 MCG) BY MOUTH DAILY BEFORE AND BREAKFAST 12/21/21   Orson Slick, MD  Multiple Vitamin (MULTIVITAMIN WITH MINERALS) TABS tablet Take 1 tablet by mouth daily.     [provider]  OVER THE COUNTER MEDICATION 1 capsule. [place] tail-"mushroom in a capsule"     [provider]  prochlorperazine (COMPAZINE) 10 MG tablet TAKE 1 TABLET(10 MG) BY MOUTH EVERY 6 HOURS AS NEEDED FOR NAUSEA OR VOMITING 03/08/19   Curt Bears, MD  topiramate (TOPAMAX) 50 MG tablet TAKE 1 TABLET(50 MG) BY MOUTH TWICE DAILY 03/29/22   Suzzanne Cloud, NP  varenicline (CHANTIX PAK) 0.5 MG X 11 & 1 MG X 42 tablet Take 2 tablets by mouth daily. 02/21/22   [provider]      Allergies    Lidocaine    Review of Systems   Review of Systems  Constitutional:  Negative for fever.  Musculoskeletal:  Positive for neck pain.  All other systems  reviewed and are negative.   Physical Exam Updated Vital Signs BP 129/74   Pulse 90   Temp 97.8 F (36.6 C) (Oral)   Resp 19   Ht 1.676 m (5\' 6" )   Wt 84 kg   SpO2 99%   BMI 29.89 kg/m  Physical Exam Vitals and nursing note reviewed.  Constitutional:      Appearance: She is well-developed.     Comments: ABCs intact  HENT:     Head: Normocephalic.     Comments: Abrasion left temporal region, with associated bruising Eyes:     Extraocular Movements: Extraocular movements intact.     Pupils: Pupils are equal, round, and reactive to light.  Neck:     Comments: C-spine tenderness  palpation, no step-off or deformity noted Cardiovascular:     Rate and Rhythm: Normal rate and regular rhythm.     Heart sounds: Normal heart sounds.  Pulmonary:     Effort: Pulmonary effort is normal. No respiratory distress.     Breath sounds: Wheezing present.  Abdominal:     Palpations: Abdomen is soft.     Tenderness: There is no abdominal tenderness.  Musculoskeletal:        General: No swelling, tenderness or deformity.     Cervical back: Normal range of motion and neck supple.  Skin:    General: Skin is warm and dry.  Neurological:     Mental Status: She is alert and oriented to person, place, and time.     Comments: 5 out of 5 strength in all 4 extremities  Psychiatric:        Mood and Affect: Mood normal.     ED Results / Procedures / Treatments   Labs (all labs ordered are listed, but only abnormal results are displayed) Labs Reviewed - No data to display  EKG None  Radiology MR Cervical Spine Wo Contrast  Result Date: 07/22/2022 CLINICAL DATA:  Neck trauma. EXAM: MRI CERVICAL SPINE WITHOUT CONTRAST TECHNIQUE: Multiplanar, multisequence MR imaging of the cervical spine was performed. No intravenous contrast was administered. COMPARISON:  Cervical spine CT from earlier today FINDINGS: Alignment: Mild anterolisthesis at C6-7 Vertebrae: Known type 2 dens fracture with posterior displacement measuring 4 mm. On sagittal T2 weighted imaging the ligamentum flavum and posterior longitudinal ligament appear intact. Fluid signal cleft seen in the region of the anterior longitudinal ligament as marked on 11: 9 but a partial injury given continuous ligament is present based on 5:8. There are C1 posterior ring fractures by CT which are inapparent on this study. Cord: No spinal canal hematoma or cord edema Posterior Fossa, vertebral arteries, paraspinal tissues: Prevertebral swelling at the level of fracture, expected. Disc levels: Mild disc space narrowing and ridging at C4-5 and C5-6.  no degenerative cord impingement IMPRESSION: Acute type 2 dens fracture with 4 mm of posterior displacement. No cord impingement or injury. Electronically Signed   By: Jorje Guild M.D.   On: 07/22/2022 06:26   CT Head Wo Contrast  Result Date: 07/22/2022 CLINICAL DATA:  Fall, head and neck trauma. EXAM: CT HEAD WITHOUT CONTRAST CT CERVICAL SPINE WITHOUT CONTRAST TECHNIQUE: Multidetector CT imaging of the head and cervical spine was performed following the standard protocol without intravenous contrast. Multiplanar CT image reconstructions of the cervical spine were also generated. RADIATION DOSE REDUCTION: This exam was performed according to the departmental dose-optimization program which includes automated exposure control, adjustment of the mA and/or kV according to patient size and/or use of iterative reconstruction  technique. COMPARISON:  04/21/2022. FINDINGS: CT HEAD FINDINGS Brain: No acute intracranial hemorrhage, midline shift or mass effect. No extra-axial fluid collection. Extensive subcortical and periventricular white matter hypodensities are present bilaterally. No hydrocephalus. Vascular: No hyperdense vessel or unexpected calcification. Skull: Normal. Negative for fracture or focal lesion. Sinuses/Orbits: No acute finding. Other: There is a small scalp hematoma over the frontal bone on the left anteriorly. CT CERVICAL SPINE FINDINGS Alignment: Normal. Skull base and vertebrae: There is a slightly comminuted fracture through the body of the dens with a proximally 4 mm posterior displacement. There are nondisplaced fractures of the posterior arches at C1 bilaterally. The remaining bony structures are intact. Soft tissues and spinal canal: No visible canal hematoma. Mild prevertebral soft tissue edema is present anterior to C1 and C2. Disc levels: Mild intervertebral disc space narrowing and uncovertebral osteophyte formation is noted at C4-C5 and C5-C6. No significant spinal canal stenosis.  Neural foraminal stenosis is present at C4-C5 and C5-C6 on the right. Upper chest: Pleural thickening or atelectasis is noted at the right lung apex. Other: None. IMPRESSION: 1. No acute intracranial hemorrhage. 2. Extensive periventricular and subcortical white matter hypodensities bilaterally, which may be associated with known irradiation. 3. Slightly comminuted fracture of the base of the dens with 4 mm posterior displacement of the dens. Surgical consultation is recommended. 4. Bilateral nondisplaced fractures of the posterior arch at C1. Critical findings were reported to Dr. Dina Rich at 4:37 a.m. Electronically Signed   By: Brett Fairy M.D.   On: 07/22/2022 04:38   CT Cervical Spine Wo Contrast  Result Date: 07/22/2022 CLINICAL DATA:  Fall, head and neck trauma. EXAM: CT HEAD WITHOUT CONTRAST CT CERVICAL SPINE WITHOUT CONTRAST TECHNIQUE: Multidetector CT imaging of the head and cervical spine was performed following the standard protocol without intravenous contrast. Multiplanar CT image reconstructions of the cervical spine were also generated. RADIATION DOSE REDUCTION: This exam was performed according to the departmental dose-optimization program which includes automated exposure control, adjustment of the mA and/or kV according to patient size and/or use of iterative reconstruction technique. COMPARISON:  04/21/2022. FINDINGS: CT HEAD FINDINGS Brain: No acute intracranial hemorrhage, midline shift or mass effect. No extra-axial fluid collection. Extensive subcortical and periventricular white matter hypodensities are present bilaterally. No hydrocephalus. Vascular: No hyperdense vessel or unexpected calcification. Skull: Normal. Negative for fracture or focal lesion. Sinuses/Orbits: No acute finding. Other: There is a small scalp hematoma over the frontal bone on the left anteriorly. CT CERVICAL SPINE FINDINGS Alignment: Normal. Skull base and vertebrae: There is a slightly comminuted fracture through  the body of the dens with a proximally 4 mm posterior displacement. There are nondisplaced fractures of the posterior arches at C1 bilaterally. The remaining bony structures are intact. Soft tissues and spinal canal: No visible canal hematoma. Mild prevertebral soft tissue edema is present anterior to C1 and C2. Disc levels: Mild intervertebral disc space narrowing and uncovertebral osteophyte formation is noted at C4-C5 and C5-C6. No significant spinal canal stenosis. Neural foraminal stenosis is present at C4-C5 and C5-C6 on the right. Upper chest: Pleural thickening or atelectasis is noted at the right lung apex. Other: None. IMPRESSION: 1. No acute intracranial hemorrhage. 2. Extensive periventricular and subcortical white matter hypodensities bilaterally, which may be associated with known irradiation. 3. Slightly comminuted fracture of the base of the dens with 4 mm posterior displacement of the dens. Surgical consultation is recommended. 4. Bilateral nondisplaced fractures of the posterior arch at C1. Critical findings were reported to Dr.  Damonie Ellenwood at 4:37 a.m. Electronically Signed   By: Brett Fairy M.D.   On: 07/22/2022 04:38    Procedures Procedures    Medications Ordered in ED Medications  Tdap (BOOSTRIX) injection 0.5 mL (0.5 mLs Intramuscular Given 07/22/22 0344)  HYDROcodone-acetaminophen (NORCO/VICODIN) 5-325 MG per tablet 1 tablet (1 tablet Oral Given 07/22/22 0344)    ED Course/ Medical Decision Making/ A&P Clinical Course as of 07/22/22 0640  Fri Jul 22, 2022  0500 Spoke with neurosurgery.  Recommend strict hard collar and 2-week follow-up.  Requesting MRI cervical spine. [CH]    Clinical Course User Index [CH] Shatarra Wehling, Barbette Hair, MD                           Medical Decision Making Amount and/or Complexity of Data Reviewed Radiology: ordered.  Risk Prescription drug management.   This patient presents to the ED for concern of fall, this involves an extensive number of  treatment options, and is a complaint that carries with it a high risk of complications and morbidity.  I considered the following differential and admission for this acute, potentially life threatening condition.  The differential diagnosis includes head injury such as traumatic subarachnoid, subdural, epidural, cervical spine fracture, cervical strain  MDM:    This is a 71 year old female who presents after mechanical fall.  She is nontoxic and vital signs are reassuring.  She has abrasion and hematoma to left forehead.  She is only complaining of neck pain.  C collar was placed.  CT head and cervical spine obtained.  Patient was given Tdap given her injury to the left temple.  Not amenable to repair.  CT scans concerning for a dens fracture as well as a C1 arch fractures.  Discussed with neurosurgery.  Will obtain MRI.  Patient is neurologically intact.  Neurosurgery recommends keeping in strict c-collar precautions.  I relayed this to the patient and her family.  MRI does not show any spinal cord impingement or injury.  She remains neurologically intact.  Will discharge home with a short course of pain medication.  (Labs, imaging, consults)  Labs: I Ordered, and personally interpreted labs.  The pertinent results include: None  Imaging Studies ordered: I ordered imaging studies including CT head, cervical spine, MRI cervical spine I independently visualized and interpreted imaging. I agree with the radiologist interpretation  Additional history obtained from family at bedside.  External records from outside source obtained and reviewed including prior evaluations  Cardiac Monitoring: The patient was maintained on a cardiac monitor.  I personally viewed and interpreted the cardiac monitored which showed an underlying rhythm of: Normal sinus rhythm  Reevaluation: After the interventions noted above, I reevaluated the patient and found that they have :improved  Social Determinants of  Health: Lives independently, smoker  Disposition: Discharge  Co morbidities that complicate the patient evaluation  Past Medical History:  Diagnosis Date   Adenopathy 10/10/2016   PERICARINAL   Anemia    Arthritis    Asthma    COPD (chronic obstructive pulmonary disease) ([place])    Dyspnea    Headache    migraines   Hilar mass 10/10/2016   RIGHT   History of kidney stones    Insomnia    Lung nodules 10/10/2016   RIGHT LOWER LOBE   Mass of both adrenal glands ([place]) 10/10/2016   Mood swing    Pneumonia    SCL CA dx'd 2017   lung cancer Small Cell;  recurrence 10/2018   Spinal headache    Substance abuse ([place])    TOBACCO   UNSPECIFIED INFECTION OF BONE ANKLE AND FOOT 10/22/2009   Annotation: left ankle Qualifier: Diagnosis of  By: Patsy Baltimore RN, Denise       Medicines Meds ordered this encounter  Medications   Tdap (BOOSTRIX) injection 0.5 mL   HYDROcodone-acetaminophen (NORCO/VICODIN) 5-325 MG per tablet 1 tablet   oxyCODONE-acetaminophen (PERCOCET/ROXICET) 5-325 MG tablet    Sig: Take 1 tablet by mouth every 6 (six) hours as needed for severe pain.    Dispense:  15 tablet    Refill:  0    I have reviewed the patients home medicines and have made adjustments as needed  Problem List / ED Course: Problem List Items Addressed This Visit   None Visit Diagnoses     Closed odontoid fracture, initial encounter ([place])    -  Primary   Closed fracture of anterior arch of atlas, initial encounter ([place])                       Final Clinical Impression(s) / ED Diagnoses Final diagnoses:  Closed odontoid fracture, initial encounter ([place])  Closed fracture of anterior arch of atlas, initial encounter ([place])    Rx / DC Orders ED Discharge Orders          Ordered    oxyCODONE-acetaminophen (PERCOCET/ROXICET) 5-325 MG tablet  Every 6 hours PRN        07/22/22 1751              Merryl Hacker, MD 07/22/22 [phone]

## 2022-07-22 NOTE — ED Triage Notes (Signed)
BIB [facility] from home. Mechanical fall. Skin tear to left side of temporal area. Bleeding controlled. No LOC, No thinners. Neck pain, no cp, no sob.   118 CBG 158palp 70hr 95% RA  HX COPD

## 2022-07-25 ENCOUNTER — Other Ambulatory Visit: Payer: Self-pay | Admitting: Physician Assistant

## 2022-07-25 DIAGNOSIS — M542 Cervicalgia: Secondary | ICD-10-CM | POA: Diagnosis not present

## 2022-07-25 DIAGNOSIS — H6122 Impacted cerumen, left ear: Secondary | ICD-10-CM | POA: Diagnosis not present

## 2022-07-25 DIAGNOSIS — K1370 Unspecified lesions of oral mucosa: Secondary | ICD-10-CM | POA: Diagnosis not present

## 2022-07-25 DIAGNOSIS — C3491 Malignant neoplasm of unspecified part of right bronchus or lung: Secondary | ICD-10-CM

## 2022-07-26 ENCOUNTER — Inpatient Hospital Stay: Payer: Medicare Other

## 2022-07-26 ENCOUNTER — Inpatient Hospital Stay: Payer: Medicare Other | Attending: Physician Assistant

## 2022-07-26 ENCOUNTER — Inpatient Hospital Stay (HOSPITAL_BASED_OUTPATIENT_CLINIC_OR_DEPARTMENT_OTHER): Payer: Medicare Other | Admitting: Physician Assistant

## 2022-07-26 ENCOUNTER — Other Ambulatory Visit: Payer: Self-pay

## 2022-07-26 VITALS — BP 125/84 | HR 91 | Resp 19 | Wt 175.2 lb

## 2022-07-26 DIAGNOSIS — C3490 Malignant neoplasm of unspecified part of unspecified bronchus or lung: Secondary | ICD-10-CM | POA: Insufficient documentation

## 2022-07-26 DIAGNOSIS — C3491 Malignant neoplasm of unspecified part of right bronchus or lung: Secondary | ICD-10-CM | POA: Diagnosis not present

## 2022-07-26 DIAGNOSIS — Z923 Personal history of irradiation: Secondary | ICD-10-CM | POA: Insufficient documentation

## 2022-07-26 DIAGNOSIS — Z8041 Family history of malignant neoplasm of ovary: Secondary | ICD-10-CM | POA: Insufficient documentation

## 2022-07-26 DIAGNOSIS — R110 Nausea: Secondary | ICD-10-CM | POA: Diagnosis not present

## 2022-07-26 DIAGNOSIS — B370 Candidal stomatitis: Secondary | ICD-10-CM | POA: Diagnosis not present

## 2022-07-26 DIAGNOSIS — Z800 Family history of malignant neoplasm of digestive organs: Secondary | ICD-10-CM | POA: Diagnosis not present

## 2022-07-26 DIAGNOSIS — J449 Chronic obstructive pulmonary disease, unspecified: Secondary | ICD-10-CM | POA: Diagnosis not present

## 2022-07-26 DIAGNOSIS — F17210 Nicotine dependence, cigarettes, uncomplicated: Secondary | ICD-10-CM | POA: Insufficient documentation

## 2022-07-26 DIAGNOSIS — Z95828 Presence of other vascular implants and grafts: Secondary | ICD-10-CM | POA: Diagnosis not present

## 2022-07-26 LAB — CBC WITH DIFFERENTIAL (CANCER CENTER ONLY)
Abs Immature Granulocytes: 0.03 10*3/uL (ref 0.00–0.07)
Basophils Absolute: 0.1 10*3/uL (ref 0.0–0.1)
Basophils Relative: 1 %
Eosinophils Absolute: 0.1 10*3/uL (ref 0.0–0.5)
Eosinophils Relative: 1 %
HCT: 38.8 % (ref 36.0–46.0)
Hemoglobin: 13.4 g/dL (ref 12.0–15.0)
Immature Granulocytes: 0 %
Lymphocytes Relative: 10 %
Lymphs Abs: 0.8 10*3/uL (ref 0.7–4.0)
MCH: 32.3 pg (ref 26.0–34.0)
MCHC: 34.5 g/dL (ref 30.0–36.0)
MCV: 93.5 fL (ref 80.0–100.0)
Monocytes Absolute: 0.5 10*3/uL (ref 0.1–1.0)
Monocytes Relative: 7 %
Neutro Abs: 6.7 10*3/uL (ref 1.7–7.7)
Neutrophils Relative %: 81 %
Platelet Count: 348 10*3/uL (ref 150–400)
RBC: 4.15 MIL/uL (ref 3.87–5.11)
RDW: 13.2 % (ref 11.5–15.5)
WBC Count: 8.2 10*3/uL (ref 4.0–10.5)
nRBC: 0 % (ref 0.0–0.2)

## 2022-07-26 LAB — CMP (CANCER CENTER ONLY)
ALT: 13 U/L (ref 0–44)
AST: 20 U/L (ref 15–41)
Albumin: 3.4 g/dL — ABNORMAL LOW (ref 3.5–5.0)
Alkaline Phosphatase: 139 U/L — ABNORMAL HIGH (ref 38–126)
Anion gap: 7 (ref 5–15)
BUN: 18 mg/dL (ref 8–23)
CO2: 25 mmol/L (ref 22–32)
Calcium: 9 mg/dL (ref 8.9–10.3)
Chloride: 105 mmol/L (ref 98–111)
Creatinine: 1.2 mg/dL — ABNORMAL HIGH (ref 0.44–1.00)
GFR, Estimated: 49 mL/min — ABNORMAL LOW (ref >60)
Glucose, Bld: 97 mg/dL (ref 70–99)
Potassium: 3.8 mmol/L (ref 3.5–5.1)
Sodium: 137 mmol/L (ref 135–145)
Total Bilirubin: 0.4 mg/dL (ref 0.3–1.2)
Total Protein: 7.1 g/dL (ref 6.5–8.1)

## 2022-07-26 MED ORDER — NYSTATIN 100000 UNIT/ML MT SUSP: 5.0000 mL | Freq: Four times a day (QID) | OROMUCOSAL | 0 refills | Status: DC

## 2022-07-26 MED ORDER — SODIUM CHLORIDE 0.9% FLUSH
10.0000 mL | INTRAVENOUS | Status: DC | PRN
  Administered 2022-07-26: 10 mL via INTRAVENOUS

## 2022-07-26 MED ORDER — SODIUM CHLORIDE 0.9% FLUSH: 10.0000 mL | Freq: Once | INTRAVENOUS | Status: DC

## 2022-07-26 MED ORDER — HEPARIN SOD (PORK) LOCK FLUSH 100 UNIT/ML IV SOLN
500.0000 [IU] | Freq: Once | INTRAVENOUS | Status: AC
  Administered 2022-07-26: 500 [IU] via INTRAVENOUS

## 2022-07-26 MED ORDER — HEPARIN SOD (PORK) LOCK FLUSH 100 UNIT/ML IV SOLN: 500.0000 [IU] | Freq: Once | INTRAVENOUS | Status: DC

## 2022-07-26 MED ORDER — ONDANSETRON 8 MG PO TBDP: 8.0000 mg | ORAL_TABLET | Freq: Three times a day (TID) | ORAL | 0 refills | Status: DC | PRN

## 2022-07-26 NOTE — Progress Notes (Signed)
[place] [phone]   [phone]  PROGRESS NOTE  Patient Care Team: Mckinley Jewel, MD as PCP - General (Internal Medicine) Curt Bears, MD as Consulting Physician (Oncology)  Hematological/Oncological History # Small Cell Lung Cancer, Limited stage (T2b, N2, M0) 03/06/2017: completed Carbo/Etop with radiation x 6 cycles 03/05/2019: started Carbo/Etop/Atezo for recurrence with Dr. Julien Nordmann. 04/26/2021: transfer care to Dr. Lorenso Courier. Cycle 26 of atezolizumab.  05/18/2021: Cycle 27 of atezolizumab maintenance therapy. Last treatment she received. Wished to d/c immunotherapy.   Interval History:  Tammy Bowers 71 y.o. female with medical history significant for limited stage Small Cell Lung cancer who presents for a follow up visit. The patient's last visit was on 09/13/2021. In the interim since the last visit she has missed/canceled numerous visits and was lost to follow up.   On exam today Tammy Bowers reports she is recovering from her recent fall that led to ED visit on 07/22/2022. She is currently in a c-collar due to dens fracture as well as a C1 arch fractures. She is taking percocet as needed for pain control. She does experience constipation  with her last bowel movement 2-3 days ago. She is not taking any stool softeners at this time but drinks prune juice. She continues to smoke daily, up to 10 cigarettes per day. She does have shortness of breath secondary to COPD. She denies fevers, chills, night sweat, chest pain, cough. She has no other complaints.  A full 10 point ROS is listed below.   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   Mood swing    Pneumonia    SCL CA dx'd 2017   lung cancer Small Cell; recurrence 10/2018   Spinal headache    Substance abuse ([place])    TOBACCO   UNSPECIFIED INFECTION OF BONE ANKLE AND FOOT 10/22/2009   Annotation: left ankle Qualifier: Diagnosis of  By: Gustavo Lah      SURGICAL HISTORY: Past Surgical History:  Procedure Laterality Date   ANKLE SURGERY     hardware removal, and initial ankle surgery   APPENDECTOMY     IR IMAGING GUIDED PORT INSERTION  04/12/2019   LUNG BIOPSY N/A 10/19/2016   Procedure: LUNG BIOPSY;  Surgeon: Grace Isaac, MD;  Location: [facility];  Service: Thoracic;  Laterality: N/A;   LUNG BIOPSY N/A 02/20/2019   Procedure: LUNG BIOPSY;  Surgeon: Grace Isaac, MD;  Location: [facility];  Service: Thoracic;  Laterality: N/A;   TONSILLECTOMY     VIDEO BRONCHOSCOPY WITH ENDOBRONCHIAL ULTRASOUND N/A 10/19/2016   Procedure: VIDEO BRONCHOSCOPY WITH ENDOBRONCHIAL ULTRASOUND;  Surgeon: Grace Isaac, MD;  Location: [facility];  Service: Thoracic;  Laterality: N/A;   VIDEO BRONCHOSCOPY WITH ENDOBRONCHIAL ULTRASOUND N/A 02/20/2019   Procedure: VIDEO BRONCHOSCOPY WITH ENDOBRONCHIAL ULTRASOUND;  Surgeon: Grace Isaac, MD;  Location: [facility];  Service: Thoracic;  Laterality: N/A;    SOCIAL HISTORY: Social History   Socioeconomic History   Marital status: Married    Spouse name: Jenny Reichmann  Number of children: 2   Years of education: Not on file   Highest education level: Not on file  Occupational History   Occupation: retired  Tobacco Use   Smoking status: Every Day    Packs/day: 1.00    Years: 50.00    Total pack years: 50.00    Types: Cigarettes   Smokeless tobacco: Never   Tobacco comments:    Smokes 10 - 15 cigarettes a day ARJ 05/19/22  Vaping Use   Vaping Use: Never used  Substance and Sexual Activity   Alcohol use: Yes    Alcohol/week: 7.0 standard drinks of alcohol    Types: 7 Shots of liquor per week   Drug use:  Not Currently    Types: Marijuana    Comment: August/Sept 2017 last ttime   Sexual activity: Yes    Partners: Male    Birth control/protection: Post-menopausal  Other Topics Concern   Not on file  Social History Narrative   Lives with husband [name]   Left Handed   Drinks 3-4 cups daily   Social Determinants of Health   Financial Resource Strain: Not on file  Food Insecurity: Not on file  Transportation Needs: Not on file  Physical Activity: Not on file  Stress: Not on file  Social Connections: Not on file  Intimate Partner Violence: Not on file    FAMILY HISTORY: Family History  Problem Relation Age of Onset   Heart disease Father    Colon cancer Father    Heart disease Paternal Grandfather    Ovarian cancer Mother     ALLERGIES:  is allergic to lidocaine.  MEDICATIONS:  Current Outpatient Medications  Medication Sig Dispense Refill   acetaminophen-codeine (TYLENOL #3) 300-30 MG tablet Take 1 tablet by mouth every 8 (eight) hours as needed for moderate pain. 21 tablet 0   albuterol (PROVENTIL HFA;VENTOLIN HFA) 108 (90 BASE) MCG/ACT inhaler Inhale 2 puffs into the lungs every 4 (four) hours as needed for wheezing.     ALPRAZolam (XANAX) 0.5 MG tablet Take 2 tablets approximately 45 minutes prior to the MRI study, take a third tablet if needed. 3 tablet 0   clonazePAM (KLONOPIN) 0.5 MG tablet      cyclobenzaprine (FLEXERIL) 5 MG tablet Take 1 tablet by mouth 3 times/day as needed-between meals & bedtime.     diphenoxylate-atropine (LOMOTIL) 2.5-0.025 MG tablet Take 1 tablet by mouth 4 (four) times daily as needed for diarrhea or loose stools. 30 tablet 0   Fluticasone-Salmeterol (ADVAIR) 500-50 MCG/DOSE AEPB Inhale 1 puff into the lungs 2 (two) times daily.     guaiFENesin-codeine (ROBITUSSIN AC) 100-10 MG/5ML syrup Take 5 mLs by mouth 3 (three) times daily as needed for cough. 120 mL 0   HYDROcodone-acetaminophen (HYCET) 7.5-325 mg/15 ml solution Take 10 mLs by mouth 4  (four) times daily as needed for moderate pain (use before meals and at bedtime prn). 473 mL 0   ibuprofen (ADVIL) 800 MG tablet Take 800 mg by mouth every 8 (eight) hours as needed.     levothyroxine (SYNTHROID) 112 MCG tablet TAKE 1 TABLET(112 MCG) BY MOUTH DAILY BEFORE AND BREAKFAST 90 tablet 1   Multiple Vitamin (MULTIVITAMIN WITH MINERALS) TABS tablet Take 1 tablet by mouth daily.      nystatin (MYCOSTATIN) 100000 UNIT/ML suspension Take 5 mLs (500,000 Units total) by mouth 4 (four) times daily. 60 mL 0   ondansetron (ZOFRAN-ODT) 8 MG disintegrating tablet Take 1 tablet (8 mg total) by mouth every 8 (  eight) hours as needed for nausea or vomiting. 90 tablet 0   OVER THE COUNTER MEDICATION 1 capsule. [place] tail-"mushroom in a capsule"      oxyCODONE-acetaminophen (PERCOCET/ROXICET) 5-325 MG tablet Take 1 tablet by mouth every 6 (six) hours as needed for severe pain. 15 tablet 0   prochlorperazine (COMPAZINE) 10 MG tablet TAKE 1 TABLET(10 MG) BY MOUTH EVERY 6 HOURS AS NEEDED FOR NAUSEA OR VOMITING 30 tablet 0   topiramate (TOPAMAX) 50 MG tablet TAKE 1 TABLET(50 MG) BY MOUTH TWICE DAILY 180 tablet 1   varenicline (CHANTIX PAK) 0.5 MG X 11 & 1 MG X 42 tablet Take 2 tablets by mouth daily.     No current facility-administered medications for this visit.   Facility-Administered Medications Ordered in Other Visits  Medication Dose Route Frequency Provider Last Rate Last Admin   heparin lock flush 100 unit/mL  500 Units Intracatheter Once Curt Bears, MD       sodium chloride flush (NS) 0.9 % injection 10 mL  10 mL Intracatheter Once Curt Bears, MD       sodium chloride flush (NS) 0.9 % injection 10 mL  10 mL Intravenous PRN Dede Query T, PA-C   10 mL at 07/26/22 1516    REVIEW OF SYSTEMS:   Constitutional: ( - ) fevers, ( - )  chills , ( - ) night sweats Eyes: ( - ) blurriness of vision, ( - ) double vision, ( - ) watery eyes Ears, nose, mouth, throat, and face: ( - ) mucositis, (  - ) sore throat Respiratory: ( - ) cough, ( - ) dyspnea, ( - ) wheezes Cardiovascular: ( - ) palpitation, ( - ) chest discomfort, ( - ) lower extremity swelling Gastrointestinal:  ( + ) nausea, ( - ) heartburn, ( + ) change in bowel habits Skin: ( - ) abnormal skin rashes Lymphatics: ( - ) new lymphadenopathy, ( - ) easy bruising Neurological: ( - ) numbness, ( - ) tingling, ( - ) new weaknesses Behavioral/Psych: ( - ) mood change, ( - ) new changes  All other systems were reviewed with the patient and are negative.  PHYSICAL EXAMINATION: ECOG PERFORMANCE STATUS: 2 - Symptomatic, <50% confined to bed  Vitals:   07/26/22 1425  BP: 125/84  Pulse: 91  Resp: 19  SpO2: 95%   Filed Weights   07/26/22 1425  Weight: 175 lb 4 oz (79.5 kg)    GENERAL: Chronically ill-appearing elderly Caucasian female alert, no distress and comfortable SKIN: skin color, texture, turgor are normal, no rashes or significant lesions EYES: conjunctiva are pink and non-injected, sclera clear MOUTH: Oral thrush on tongue.  LUNGS: Diffuse wheezing noted. No respiratory distress.  HEART: regular rate & rhythm and no murmurs and no lower extremity edema Musculoskeletal: no cyanosis of digits and no clubbing  PSYCH: alert & oriented x 3, fluent speech NEURO: no focal motor/sensory deficits  LABORATORY DATA:  I have reviewed the data as listed    Latest Ref Rng & Units 07/26/2022    2:04 PM 09/10/2021    3:59 PM 05/18/2021   10:23 AM  CBC  WBC 4.0 - 10.5 K/uL 8.2  6.9  7.2   Hemoglobin 12.0 - 15.0 g/dL 13.4  14.4  13.1   Hematocrit 36.0 - 46.0 % 38.8  43.4  39.2   Platelets 150 - 400 K/uL 348  258  272        Latest Ref Rng & Units 07/26/2022  2:04 PM 03/29/2022    3:48 PM 12/28/2021    4:50 PM  CMP  Glucose 70 - 99 mg/dL 97     BUN 8 - 23 mg/dL 18     Creatinine 0.44 - 1.00 mg/dL 1.20  1.18  1.20   Sodium 135 - 145 mmol/L 137     Potassium 3.5 - 5.1 mmol/L 3.8     Chloride 98 - 111 mmol/L 105      CO2 22 - 32 mmol/L 25     Calcium 8.9 - 10.3 mg/dL 9.0     Total Protein 6.5 - 8.1 g/dL 7.1     Total Bilirubin 0.3 - 1.2 mg/dL 0.4     Alkaline Phos 38 - 126 U/L 139     AST 15 - 41 U/L 20     ALT 0 - 44 U/L 13       RADIOGRAPHIC STUDIES: MR Cervical Spine Wo Contrast  Result Date: 07/22/2022 CLINICAL DATA:  Neck trauma. EXAM: MRI CERVICAL SPINE WITHOUT CONTRAST TECHNIQUE: Multiplanar, multisequence MR imaging of the cervical spine was performed. No intravenous contrast was administered. COMPARISON:  Cervical spine CT from earlier today FINDINGS: Alignment: Mild anterolisthesis at C6-7 Vertebrae: Known type 2 dens fracture with posterior displacement measuring 4 mm. On sagittal T2 weighted imaging the ligamentum flavum and posterior longitudinal ligament appear intact. Fluid signal cleft seen in the region of the anterior longitudinal ligament as marked on 11: 9 but a partial injury given continuous ligament is present based on 5:8. There are C1 posterior ring fractures by CT which are inapparent on this study. Cord: No spinal canal hematoma or cord edema Posterior Fossa, vertebral arteries, paraspinal tissues: Prevertebral swelling at the level of fracture, expected. Disc levels: Mild disc space narrowing and ridging at C4-5 and C5-6. no degenerative cord impingement IMPRESSION: Acute type 2 dens fracture with 4 mm of posterior displacement. No cord impingement or injury. Electronically Signed   By: Jorje Guild M.D.   On: 07/22/2022 06:26   CT Head Wo Contrast  Result Date: 07/22/2022 CLINICAL DATA:  Fall, head and neck trauma. EXAM: CT HEAD WITHOUT CONTRAST CT CERVICAL SPINE WITHOUT CONTRAST TECHNIQUE: Multidetector CT imaging of the head and cervical spine was performed following the standard protocol without intravenous contrast. Multiplanar CT image reconstructions of the cervical spine were also generated. RADIATION DOSE REDUCTION: This exam was performed according to the departmental  dose-optimization program which includes automated exposure control, adjustment of the mA and/or kV according to patient size and/or use of iterative reconstruction technique. COMPARISON:  04/21/2022. FINDINGS: CT HEAD FINDINGS Brain: No acute intracranial hemorrhage, midline shift or mass effect. No extra-axial fluid collection. Extensive subcortical and periventricular white matter hypodensities are present bilaterally. No hydrocephalus. Vascular: No hyperdense vessel or unexpected calcification. Skull: Normal. Negative for fracture or focal lesion. Sinuses/Orbits: No acute finding. Other: There is a small scalp hematoma over the frontal bone on the left anteriorly. CT CERVICAL SPINE FINDINGS Alignment: Normal. Skull base and vertebrae: There is a slightly comminuted fracture through the body of the dens with a proximally 4 mm posterior displacement. There are nondisplaced fractures of the posterior arches at C1 bilaterally. The remaining bony structures are intact. Soft tissues and spinal canal: No visible canal hematoma. Mild prevertebral soft tissue edema is present anterior to C1 and C2. Disc levels: Mild intervertebral disc space narrowing and uncovertebral osteophyte formation is noted at C4-C5 and C5-C6. No significant spinal canal stenosis. Neural foraminal stenosis is present at  C4-C5 and C5-C6 on the right. Upper chest: Pleural thickening or atelectasis is noted at the right lung apex. Other: None. IMPRESSION: 1. No acute intracranial hemorrhage. 2. Extensive periventricular and subcortical white matter hypodensities bilaterally, which may be associated with known irradiation. 3. Slightly comminuted fracture of the base of the dens with 4 mm posterior displacement of the dens. Surgical consultation is recommended. 4. Bilateral nondisplaced fractures of the posterior arch at C1. Critical findings were reported to Dr. Dina Rich at 4:37 a.m. Electronically Signed   By: Brett Fairy M.D.   On: 07/22/2022 04:38    CT Cervical Spine Wo Contrast  Result Date: 07/22/2022 CLINICAL DATA:  Fall, head and neck trauma. EXAM: CT HEAD WITHOUT CONTRAST CT CERVICAL SPINE WITHOUT CONTRAST TECHNIQUE: Multidetector CT imaging of the head and cervical spine was performed following the standard protocol without intravenous contrast. Multiplanar CT image reconstructions of the cervical spine were also generated. RADIATION DOSE REDUCTION: This exam was performed according to the departmental dose-optimization program which includes automated exposure control, adjustment of the mA and/or kV according to patient size and/or use of iterative reconstruction technique. COMPARISON:  04/21/2022. FINDINGS: CT HEAD FINDINGS Brain: No acute intracranial hemorrhage, midline shift or mass effect. No extra-axial fluid collection. Extensive subcortical and periventricular white matter hypodensities are present bilaterally. No hydrocephalus. Vascular: No hyperdense vessel or unexpected calcification. Skull: Normal. Negative for fracture or focal lesion. Sinuses/Orbits: No acute finding. Other: There is a small scalp hematoma over the frontal bone on the left anteriorly. CT CERVICAL SPINE FINDINGS Alignment: Normal. Skull base and vertebrae: There is a slightly comminuted fracture through the body of the dens with a proximally 4 mm posterior displacement. There are nondisplaced fractures of the posterior arches at C1 bilaterally. The remaining bony structures are intact. Soft tissues and spinal canal: No visible canal hematoma. Mild prevertebral soft tissue edema is present anterior to C1 and C2. Disc levels: Mild intervertebral disc space narrowing and uncovertebral osteophyte formation is noted at C4-C5 and C5-C6. No significant spinal canal stenosis. Neural foraminal stenosis is present at C4-C5 and C5-C6 on the right. Upper chest: Pleural thickening or atelectasis is noted at the right lung apex. Other: None. IMPRESSION: 1. No acute intracranial  hemorrhage. 2. Extensive periventricular and subcortical white matter hypodensities bilaterally, which may be associated with known irradiation. 3. Slightly comminuted fracture of the base of the dens with 4 mm posterior displacement of the dens. Surgical consultation is recommended. 4. Bilateral nondisplaced fractures of the posterior arch at C1. Critical findings were reported to Dr. Dina Rich at 4:37 a.m. Electronically Signed   By: Brett Fairy M.D.   On: 07/22/2022 04:38     ASSESSMENT & PLAN Tammy Bowers 71 y.o. female with medical history significant for limited stage Small Cell Lung cancer who presents for a follow up visit.  #Small Cell Lung Cancer, Recurrent Limited Stage --Patient elected to discontinue treatment at last visit with Dr. Lorenso Courier on 09/13/2022. Patient has missed/cancelled visits and was lost to follow up afterwards.  --Patient returns to re-establish care. Labs today were reviewed and require no intervention. --Last CT C/A/P on 9/23/222 showed stable disease with no clear evidence of progression --MRI brain last on 04/21/2022 shows no evidence of metastatic disease  --Recommend to undergo repeat CT imaging to evaluate her cancer. Plan to schedule in the next 2-3 weeks.  --RTC for labs and follow up with Dr. Lorenso Courier to review CT scan results. Patient will decide if she wants to pursue treatment at  that time. If she decides not to pursue any treatment, she would like to remove her port.   #Nausea: --Secondary to pain medication --Sent zofran ODT 8 mg q 8 hours PRN  #Oral Thrush: --Sent nystatin mouthwash  No orders of the defined types were placed in this encounter.   All questions were answered. The patient knows to call the clinic with any problems, questions or concerns.  I have spent a total of 30 minutes minutes of face-to-face and non-face-to-face time, preparing to see the patient,  performing a medically appropriate examination, counseling and educating the patient,  ordering tests/procedures, documenting clinical information in the electronic health record,  and care coordination.   Dede Query PA-C Dept of Hematology and [place] at [facility] Phone: [phone]   07/26/2022 5:11 PM

## 2022-07-30 ENCOUNTER — Inpatient Hospital Stay (HOSPITAL_COMMUNITY)
Admission: EM | Admit: 2022-07-30 | Discharge: 2022-08-01 | DRG: 191 | Disposition: A | Discharge status: Home Health | Payer: Medicare Other | Attending: Internal Medicine | Admitting: Internal Medicine

## 2022-07-30 ENCOUNTER — Other Ambulatory Visit: Payer: Self-pay

## 2022-07-30 ENCOUNTER — Emergency Department (HOSPITAL_COMMUNITY): Payer: Medicare Other

## 2022-07-30 DIAGNOSIS — R220 Localized swelling, mass and lump, head: Secondary | ICD-10-CM | POA: Diagnosis not present

## 2022-07-30 DIAGNOSIS — W19XXXA Unspecified fall, initial encounter: Secondary | ICD-10-CM | POA: Diagnosis present

## 2022-07-30 DIAGNOSIS — J432 Centrilobular emphysema: Secondary | ICD-10-CM

## 2022-07-30 DIAGNOSIS — J189 Pneumonia, unspecified organism: Secondary | ICD-10-CM

## 2022-07-30 DIAGNOSIS — R531 Weakness: Secondary | ICD-10-CM | POA: Diagnosis not present

## 2022-07-30 DIAGNOSIS — J449 Chronic obstructive pulmonary disease, unspecified: Secondary | ICD-10-CM | POA: Diagnosis not present

## 2022-07-30 DIAGNOSIS — Z7951 Long term (current) use of inhaled steroids: Secondary | ICD-10-CM

## 2022-07-30 DIAGNOSIS — R946 Abnormal results of thyroid function studies: Secondary | ICD-10-CM | POA: Diagnosis present

## 2022-07-30 DIAGNOSIS — J441 Chronic obstructive pulmonary disease with (acute) exacerbation: Secondary | ICD-10-CM | POA: Diagnosis not present

## 2022-07-30 DIAGNOSIS — R404 Transient alteration of awareness: Secondary | ICD-10-CM | POA: Diagnosis not present

## 2022-07-30 DIAGNOSIS — Z9221 Personal history of antineoplastic chemotherapy: Secondary | ICD-10-CM

## 2022-07-30 DIAGNOSIS — C3401 Malignant neoplasm of right main bronchus: Secondary | ICD-10-CM | POA: Diagnosis present

## 2022-07-30 DIAGNOSIS — S12110A Anterior displaced Type II dens fracture, initial encounter for closed fracture: Secondary | ICD-10-CM | POA: Diagnosis present

## 2022-07-30 DIAGNOSIS — C3490 Malignant neoplasm of unspecified part of unspecified bronchus or lung: Principal | ICD-10-CM

## 2022-07-30 DIAGNOSIS — B370 Candidal stomatitis: Secondary | ICD-10-CM | POA: Diagnosis present

## 2022-07-30 DIAGNOSIS — E669 Obesity, unspecified: Secondary | ICD-10-CM | POA: Diagnosis present

## 2022-07-30 DIAGNOSIS — Z91199 Patient's noncompliance with other medical treatment and regimen due to unspecified reason: Secondary | ICD-10-CM

## 2022-07-30 DIAGNOSIS — I672 Cerebral atherosclerosis: Secondary | ICD-10-CM | POA: Diagnosis not present

## 2022-07-30 DIAGNOSIS — Z87442 Personal history of urinary calculi: Secondary | ICD-10-CM

## 2022-07-30 DIAGNOSIS — G7080 Lambert-Eaton syndrome, unspecified: Secondary | ICD-10-CM | POA: Diagnosis present

## 2022-07-30 DIAGNOSIS — Z8781 Personal history of (healed) traumatic fracture: Secondary | ICD-10-CM

## 2022-07-30 DIAGNOSIS — J984 Other disorders of lung: Secondary | ICD-10-CM | POA: Diagnosis not present

## 2022-07-30 DIAGNOSIS — Z800 Family history of malignant neoplasm of digestive organs: Secondary | ICD-10-CM

## 2022-07-30 DIAGNOSIS — R0902 Hypoxemia: Secondary | ICD-10-CM | POA: Diagnosis not present

## 2022-07-30 DIAGNOSIS — R918 Other nonspecific abnormal finding of lung field: Secondary | ICD-10-CM | POA: Diagnosis not present

## 2022-07-30 DIAGNOSIS — J90 Pleural effusion, not elsewhere classified: Secondary | ICD-10-CM | POA: Diagnosis not present

## 2022-07-30 DIAGNOSIS — S12112A Nondisplaced Type II dens fracture, initial encounter for closed fracture: Secondary | ICD-10-CM | POA: Diagnosis not present

## 2022-07-30 DIAGNOSIS — Z6828 Body mass index (BMI) 28.0-28.9, adult: Secondary | ICD-10-CM

## 2022-07-30 DIAGNOSIS — F17290 Nicotine dependence, other tobacco product, uncomplicated: Secondary | ICD-10-CM | POA: Diagnosis present

## 2022-07-30 DIAGNOSIS — F1120 Opioid dependence, uncomplicated: Secondary | ICD-10-CM | POA: Diagnosis present

## 2022-07-30 DIAGNOSIS — Z8249 Family history of ischemic heart disease and other diseases of the circulatory system: Secondary | ICD-10-CM

## 2022-07-30 DIAGNOSIS — E876 Hypokalemia: Secondary | ICD-10-CM | POA: Diagnosis present

## 2022-07-30 DIAGNOSIS — R4182 Altered mental status, unspecified: Secondary | ICD-10-CM | POA: Diagnosis not present

## 2022-07-30 DIAGNOSIS — I10 Essential (primary) hypertension: Secondary | ICD-10-CM | POA: Diagnosis not present

## 2022-07-30 DIAGNOSIS — Z79899 Other long term (current) drug therapy: Secondary | ICD-10-CM

## 2022-07-30 DIAGNOSIS — Z79890 Hormone replacement therapy (postmenopausal): Secondary | ICD-10-CM

## 2022-07-30 DIAGNOSIS — E039 Hypothyroidism, unspecified: Secondary | ICD-10-CM | POA: Diagnosis present

## 2022-07-30 DIAGNOSIS — S12190A Other displaced fracture of second cervical vertebra, initial encounter for closed fracture: Secondary | ICD-10-CM | POA: Diagnosis not present

## 2022-07-30 DIAGNOSIS — Z8041 Family history of malignant neoplasm of ovary: Secondary | ICD-10-CM

## 2022-07-30 DIAGNOSIS — M4312 Spondylolisthesis, cervical region: Secondary | ICD-10-CM | POA: Diagnosis not present

## 2022-07-30 LAB — COMPREHENSIVE METABOLIC PANEL
ALT: 17 U/L (ref 0–44)
AST: 21 U/L (ref 15–41)
Albumin: 2.7 g/dL — ABNORMAL LOW (ref 3.5–5.0)
Alkaline Phosphatase: 153 U/L — ABNORMAL HIGH (ref 38–126)
Anion gap: 10 (ref 5–15)
BUN: 20 mg/dL (ref 8–23)
CO2: 20 mmol/L — ABNORMAL LOW (ref 22–32)
Calcium: 8.9 mg/dL (ref 8.9–10.3)
Chloride: 105 mmol/L (ref 98–111)
Creatinine, Ser: 1.21 mg/dL — ABNORMAL HIGH (ref 0.44–1.00)
GFR, Estimated: 49 mL/min — ABNORMAL LOW (ref >60)
Glucose, Bld: 91 mg/dL (ref 70–99)
Potassium: 3.7 mmol/L (ref 3.5–5.1)
Sodium: 135 mmol/L (ref 135–145)
Total Bilirubin: 0.7 mg/dL (ref 0.3–1.2)
Total Protein: 6.7 g/dL (ref 6.5–8.1)

## 2022-07-30 LAB — LACTIC ACID, PLASMA: Lactic Acid, Venous: 0.9 mmol/L (ref 0.5–1.9)

## 2022-07-30 LAB — CBC WITH DIFFERENTIAL/PLATELET
Abs Immature Granulocytes: 0.06 10*3/uL (ref 0.00–0.07)
Basophils Absolute: 0 10*3/uL (ref 0.0–0.1)
Basophils Relative: 1 %
Eosinophils Absolute: 0 10*3/uL (ref 0.0–0.5)
Eosinophils Relative: 0 %
HCT: 41.6 % (ref 36.0–46.0)
Hemoglobin: 14.2 g/dL (ref 12.0–15.0)
Immature Granulocytes: 1 %
Lymphocytes Relative: 6 %
Lymphs Abs: 0.5 10*3/uL — ABNORMAL LOW (ref 0.7–4.0)
MCH: 32.1 pg (ref 26.0–34.0)
MCHC: 34.1 g/dL (ref 30.0–36.0)
MCV: 94.1 fL (ref 80.0–100.0)
Monocytes Absolute: 0.4 10*3/uL (ref 0.1–1.0)
Monocytes Relative: 5 %
Neutro Abs: 7.5 10*3/uL (ref 1.7–7.7)
Neutrophils Relative %: 87 %
Platelets: 407 10*3/uL — ABNORMAL HIGH (ref 150–400)
RBC: 4.42 MIL/uL (ref 3.87–5.11)
RDW: 13.2 % (ref 11.5–15.5)
WBC: 8.5 10*3/uL (ref 4.0–10.5)
nRBC: 0 % (ref 0.0–0.2)

## 2022-07-30 LAB — I-STAT VENOUS BLOOD GAS, ED
Acid-base deficit: 3 mmol/L — ABNORMAL HIGH (ref 0.0–2.0)
Bicarbonate: 19.9 mmol/L — ABNORMAL LOW (ref 20.0–28.0)
Calcium, Ion: 1.06 mmol/L — ABNORMAL LOW (ref 1.15–1.40)
HCT: 50 % — ABNORMAL HIGH (ref 36.0–46.0)
Hemoglobin: 17 g/dL — ABNORMAL HIGH (ref 12.0–15.0)
O2 Saturation: 98 %
Potassium: 3.8 mmol/L (ref 3.5–5.1)
Sodium: 134 mmol/L — ABNORMAL LOW (ref 135–145)
TCO2: 21 mmol/L — ABNORMAL LOW (ref 22–32)
pCO2, Ven: 30.3 mmHg — ABNORMAL LOW (ref 44–60)
pH, Ven: 7.426 (ref 7.25–7.43)
pO2, Ven: 104 mmHg — ABNORMAL HIGH (ref 32–45)

## 2022-07-30 LAB — TSH: TSH: 30.59 u[IU]/mL — ABNORMAL HIGH (ref 0.350–4.500)

## 2022-07-30 LAB — LIPASE, BLOOD: Lipase: 30 U/L (ref 11–51)

## 2022-07-30 LAB — MAGNESIUM: Magnesium: 1.7 mg/dL (ref 1.7–2.4)

## 2022-07-30 LAB — HIV ANTIBODY (ROUTINE TESTING W REFLEX): HIV Screen 4th Generation wRfx: NONREACTIVE

## 2022-07-30 MED ORDER — SODIUM CHLORIDE 0.9% FLUSH: 10.0000 mL | INTRAVENOUS | Status: DC | PRN

## 2022-07-30 MED ORDER — ALBUTEROL SULFATE (2.5 MG/3ML) 0.083% IN NEBU: 2.5000 mg | INHALATION_SOLUTION | RESPIRATORY_TRACT | Status: DC | PRN

## 2022-07-30 MED ORDER — SODIUM CHLORIDE 0.9 % IV SOLN
500.0000 mg | Freq: Once | INTRAVENOUS | Status: DC
  Filled 2022-07-30: qty 5

## 2022-07-30 MED ORDER — CYCLOBENZAPRINE HCL 5 MG PO TABS
5.0000 mg | ORAL_TABLET | Freq: Two times a day (BID) | ORAL | Status: DC | PRN
Start: 2022-07-30 — End: 2022-08-02
  Administered 2022-07-31 (×2): 5 mg via ORAL
  Filled 2022-07-30 (×2): qty 1

## 2022-07-30 MED ORDER — TOPIRAMATE 25 MG PO TABS
50.0000 mg | ORAL_TABLET | Freq: Two times a day (BID) | ORAL | Status: DC
  Administered 2022-07-31 – 2022-08-01 (×3): 50 mg via ORAL
  Filled 2022-07-30 (×5): qty 2

## 2022-07-30 MED ORDER — SODIUM CHLORIDE 0.9 % IV BOLUS
500.0000 mL | Freq: Once | INTRAVENOUS | Status: AC
  Administered 2022-07-30: 500 mL via INTRAVENOUS

## 2022-07-30 MED ORDER — SODIUM CHLORIDE 0.9 % IV SOLN: INTRAVENOUS | Status: DC

## 2022-07-30 MED ORDER — OXYCODONE-ACETAMINOPHEN 5-325 MG PO TABS
1.0000 | ORAL_TABLET | Freq: Four times a day (QID) | ORAL | Status: DC | PRN
  Administered 2022-07-31 – 2022-08-01 (×3): 1 via ORAL
  Filled 2022-07-30 (×3): qty 1

## 2022-07-30 MED ORDER — SODIUM CHLORIDE 0.9 % IV SOLN
500.0000 mg | INTRAVENOUS | Status: AC
  Administered 2022-07-30: 500 mg via INTRAVENOUS

## 2022-07-30 MED ORDER — CHLORHEXIDINE GLUCONATE CLOTH 2 % EX PADS
6.0000 | MEDICATED_PAD | Freq: Every day | CUTANEOUS | Status: DC
  Administered 2022-07-31 – 2022-08-01 (×2): 6 via TOPICAL

## 2022-07-30 MED ORDER — ENOXAPARIN SODIUM 40 MG/0.4ML IJ SOSY
40.0000 mg | PREFILLED_SYRINGE | INTRAMUSCULAR | Status: DC
  Administered 2022-07-31 – 2022-08-01 (×2): 40 mg via SUBCUTANEOUS
  Filled 2022-07-30 (×2): qty 0.4

## 2022-07-30 MED ORDER — ALBUTEROL SULFATE HFA 108 (90 BASE) MCG/ACT IN AERS: 2.0000 | INHALATION_SPRAY | RESPIRATORY_TRACT | Status: DC | PRN

## 2022-07-30 MED ORDER — IPRATROPIUM-ALBUTEROL 0.5-2.5 (3) MG/3ML IN SOLN
3.0000 mL | Freq: Four times a day (QID) | RESPIRATORY_TRACT | Status: DC
  Administered 2022-07-31 (×2): 3 mL via RESPIRATORY_TRACT
  Filled 2022-07-30 (×2): qty 3

## 2022-07-30 MED ORDER — PREDNISONE 20 MG PO TABS: 40.0000 mg | ORAL_TABLET | Freq: Every day | ORAL | Status: DC

## 2022-07-30 MED ORDER — AZITHROMYCIN 250 MG PO TABS
500.0000 mg | ORAL_TABLET | Freq: Every day | ORAL | Status: DC
  Administered 2022-07-31 – 2022-08-01 (×2): 500 mg via ORAL
  Filled 2022-07-30 (×2): qty 2

## 2022-07-30 MED ORDER — FLUTICASONE FUROATE-VILANTEROL 200-25 MCG/ACT IN AEPB
1.0000 | INHALATION_SPRAY | Freq: Every day | RESPIRATORY_TRACT | Status: DC
  Administered 2022-08-01: 1 via RESPIRATORY_TRACT
  Filled 2022-07-30: qty 28

## 2022-07-30 MED ORDER — PROCHLORPERAZINE MALEATE 10 MG PO TABS: 10.0000 mg | ORAL_TABLET | Freq: Four times a day (QID) | ORAL | Status: DC | PRN

## 2022-07-30 MED ORDER — OXYCODONE-ACETAMINOPHEN 5-325 MG PO TABS
1.0000 | ORAL_TABLET | Freq: Once | ORAL | Status: AC
Start: 2022-07-30 — End: 2022-07-30
  Administered 2022-07-30: 1 via ORAL
  Filled 2022-07-30: qty 1

## 2022-07-30 MED ORDER — LEVOTHYROXINE SODIUM 112 MCG PO TABS
112.0000 ug | ORAL_TABLET | Freq: Every day | ORAL | Status: DC
  Administered 2022-07-31: 112 ug via ORAL
  Filled 2022-07-30: qty 1

## 2022-07-30 MED ORDER — VARENICLINE TARTRATE 0.5 MG X 11 & 1 MG X 42 PO TBPK: 2.0000 | ORAL_TABLET | Freq: Every day | ORAL | Status: DC

## 2022-07-30 MED ORDER — NYSTATIN 100000 UNIT/ML MT SUSP
5.0000 mL | Freq: Four times a day (QID) | OROMUCOSAL | Status: DC
  Administered 2022-07-30 – 2022-08-01 (×8): 500000 [IU] via ORAL
  Filled 2022-07-30 (×6): qty 5

## 2022-07-30 MED ORDER — ONDANSETRON 4 MG PO TBDP: 8.0000 mg | ORAL_TABLET | Freq: Three times a day (TID) | ORAL | Status: DC | PRN

## 2022-07-30 MED ORDER — DIPHENOXYLATE-ATROPINE 2.5-0.025 MG PO TABS
1.0000 | ORAL_TABLET | Freq: Four times a day (QID) | ORAL | Status: DC | PRN
Start: 2022-07-30 — End: 2022-08-02

## 2022-07-30 MED ORDER — CEFTRIAXONE SODIUM 1 G IJ SOLR
1.0000 g | Freq: Once | INTRAMUSCULAR | Status: AC
  Administered 2022-07-30: 1 g via INTRAVENOUS
  Filled 2022-07-30: qty 10

## 2022-07-30 MED ORDER — PREDNISONE 20 MG PO TABS
40.0000 mg | ORAL_TABLET | Freq: Every day | ORAL | Status: DC
  Administered 2022-07-30 – 2022-08-01 (×2): 40 mg via ORAL
  Filled 2022-07-30 (×2): qty 2

## 2022-07-30 NOTE — ED Notes (Signed)
Patient transported to CT 

## 2022-07-30 NOTE — ED Triage Notes (Signed)
BIB [facility] after pt's husband called to report increase lethargy, weakness, AMS. Pt had fall on 8/4, pt placed in cervical collar r/t to dx of cervical fractures but has been noncompliant since diagnosis. No thinners. No falls since fall on 8/4.    HX: COPD, HTN

## 2022-07-30 NOTE — ED Notes (Signed)
ED TO INPATIENT HANDOFF REPORT  ED Nurse Name and Phone #: Shirlee Limerick [phone]    S Name/Age/Gender Tammy Bowers 71 y.o. female Room/Bed: RESUSC/RESUSC  Code Status   Code Status: Full Code  Home/SNF/Other Home Patient oriented to: self, place, time, and situation Is this baseline? Yes   Triage Complete: Triage complete  Chief Complaint COPD (chronic obstructive pulmonary disease) ([place]) [J44.9]  Triage Note BIB [facility] after pt's husband called to report increase lethargy, weakness, AMS. Pt had fall on 8/4, pt placed in cervical collar r/t to dx of cervical fractures but has been noncompliant since diagnosis. No thinners. No falls since fall on 8/4.    HX: COPD, HTN    Allergies Allergies  Allergen Reactions   Lidocaine Swelling    SWELLING REACTION UNSPECIFIED     Level of Care/Admitting Diagnosis ED Disposition     ED Disposition  Admit   Condition  --   [place]: [place] [[id number]]  Level of Care: Med-Surg [16]  May place patient in observation at [facility] or [place] if equivalent level of care is available:: No  Covid Evaluation: Asymptomatic - no recent exposure (last 10 days) testing not required  Diagnosis: COPD (chronic obstructive pulmonary disease) [facility]) [[id number]]  Admitting Physician: Lequita Halt [[id number]]  Attending Physician: Lequita Halt [[id number]]          B Medical/Surgery History Past Medical History:  Diagnosis Date   Adenopathy 10/10/2016   PERICARINAL   Anemia    Arthritis    Asthma    COPD (chronic obstructive pulmonary disease) ([place])    Dyspnea    Headache    migraines   Hilar mass 10/10/2016   RIGHT   History of kidney stones    Insomnia    Lung nodules 10/10/2016   RIGHT LOWER LOBE   Mass of both adrenal glands ([place]) 10/10/2016   Mood swing    Pneumonia    SCL CA dx'd 2017   lung cancer Small Cell; recurrence 10/2018   Spinal headache    Substance abuse ([place])    TOBACCO    UNSPECIFIED INFECTION OF BONE ANKLE AND FOOT 10/22/2009   Annotation: left ankle Qualifier: Diagnosis of  By: Gustavo Lah     Past Surgical History:  Procedure Laterality Date   ANKLE SURGERY     hardware removal, and initial ankle surgery   APPENDECTOMY     IR IMAGING GUIDED PORT INSERTION  04/12/2019   LUNG BIOPSY N/A 10/19/2016   Procedure: LUNG BIOPSY;  Surgeon: Dewey Viens Isaac, MD;  Location: [place];  Service: Thoracic;  Laterality: N/A;   LUNG BIOPSY N/A 02/20/2019   Procedure: LUNG BIOPSY;  Surgeon: Franny Selvage Isaac, MD;  Location: [place];  Service: Thoracic;  Laterality: N/A;   TONSILLECTOMY     VIDEO BRONCHOSCOPY WITH ENDOBRONCHIAL ULTRASOUND N/A 10/19/2016   Procedure: VIDEO BRONCHOSCOPY WITH ENDOBRONCHIAL ULTRASOUND;  Surgeon: Jill Stopka Isaac, MD;  Location: [place];  Service: Thoracic;  Laterality: N/A;   VIDEO BRONCHOSCOPY WITH ENDOBRONCHIAL ULTRASOUND N/A 02/20/2019   Procedure: VIDEO BRONCHOSCOPY WITH ENDOBRONCHIAL ULTRASOUND;  Surgeon: Joann Kulpa Isaac, MD;  Location: [facility] OR;  Service: Thoracic;  Laterality: N/A;     A IV Location/Drains/Wounds Patient Lines/Drains/Airways Status     Active Line/Drains/Airways     Name Placement date Placement time Site Days   Implanted Port 04/12/19 Right Chest 04/12/19  1216  Chest  1205   External Urinary Catheter 07/30/22  1637  --  less than 1            Intake/Output Last 24 hours No intake or output data in the 24 hours ending 07/30/22 2047  Labs/Imaging Results for orders placed or performed during the hospital encounter of 07/30/22 (from the past 48 hour(s))  CBC with Differential/Platelet     Status: Abnormal   Collection Time: 07/30/22  3:35 PM  Result Value Ref Range   WBC 8.5 4.0 - 10.5 K/uL   RBC 4.42 3.87 - 5.11 MIL/uL   Hemoglobin 14.2 12.0 - 15.0 g/dL   HCT 41.6 36.0 - 46.0 %   MCV 94.1 80.0 - 100.0 fL   MCH 32.1 26.0 - 34.0 pg   MCHC 34.1 30.0 - 36.0 g/dL   RDW 13.2 11.5 - 15.5 %   Platelets 407  (H) 150 - 400 K/uL   nRBC 0.0 0.0 - 0.2 %   Neutrophils Relative % 87 %   Neutro Abs 7.5 1.7 - 7.7 K/uL   Lymphocytes Relative 6 %   Lymphs Abs 0.5 (L) 0.7 - 4.0 K/uL   Monocytes Relative 5 %   Monocytes Absolute 0.4 0.1 - 1.0 K/uL   Eosinophils Relative 0 %   Eosinophils Absolute 0.0 0.0 - 0.5 K/uL   Basophils Relative 1 %   Basophils Absolute 0.0 0.0 - 0.1 K/uL   Immature Granulocytes 1 %   Abs Immature Granulocytes 0.06 0.00 - 0.07 K/uL    Comment: Performed at [facility], [name], [place], [place] [address]  Lipase, blood     Status: None   Collection Time: 07/30/22  3:35 PM  Result Value Ref Range   Lipase 30 11 - 51 U/L    Comment: Performed at [facility], [place] [address] [place], [place] [address]  Comprehensive metabolic panel     Status: Abnormal   Collection Time: 07/30/22  3:35 PM  Result Value Ref Range   Sodium 135 135 - 145 mmol/L   Potassium 3.7 3.5 - 5.1 mmol/L   Chloride 105 98 - 111 mmol/L   CO2 20 (L) 22 - 32 mmol/L   Glucose, Bld 91 70 - 99 mg/dL    Comment: Glucose reference range applies only to samples taken after fasting for at least 8 hours.   BUN 20 8 - 23 mg/dL   Creatinine, Ser 1.21 (H) 0.44 - 1.00 mg/dL   Calcium 8.9 8.9 - 10.3 mg/dL   Total Protein 6.7 6.5 - 8.1 g/dL   Albumin 2.7 (L) 3.5 - 5.0 g/dL   AST 21 15 - 41 U/L   ALT 17 0 - 44 U/L   Alkaline Phosphatase 153 (H) 38 - 126 U/L   Total Bilirubin 0.7 0.3 - 1.2 mg/dL   GFR, Estimated 49 (L) >60 mL/min    Comment: (NOTE) Calculated using the CKD-EPI Creatinine Equation (2021)    Anion gap 10 5 - 15    Comment: Performed at [facility] [address], [place], [place] [address]  Magnesium     Status: None   Collection Time: 07/30/22  3:35 PM  Result Value Ref Range   Magnesium 1.7 1.7 - 2.4 mg/dL    Comment: Performed at [facility], [place] [address], [place], [place] [address]  I-Stat venous blood gas, ED     Status: Abnormal   Collection Time:  07/30/22  7:04 PM  Result Value Ref Range   pH, Lawson Fiscal  7.426 7.25 - 7.43   pCO2, Ven 30.3 (L) 44 - 60 mmHg   pO2, Ven 104 (H) 32 - 45 mmHg   Bicarbonate 19.9 (L) 20.0 - 28.0 mmol/L   TCO2 21 (L) 22 - 32 mmol/L   O2 Saturation 98 %   Acid-base deficit 3.0 (H) 0.0 - 2.0 mmol/L   Sodium 134 (L) 135 - 145 mmol/L   Potassium 3.8 3.5 - 5.1 mmol/L   Calcium, Ion 1.06 (L) 1.15 - 1.40 mmol/L   HCT 50.0 (H) 36.0 - 46.0 %   Hemoglobin 17.0 (H) 12.0 - 15.0 g/dL   Sample type VENOUS    CT Chest Wo Contrast  Result Date: 07/30/2022 CLINICAL DATA:  Pneumonia. EXAM: CT CHEST WITHOUT CONTRAST TECHNIQUE: Multidetector CT imaging of the chest was performed following the standard protocol without IV contrast. RADIATION DOSE REDUCTION: This exam was performed according to the departmental dose-optimization program which includes automated exposure control, adjustment of the mA and/or kV according to patient size and/or use of iterative reconstruction technique. COMPARISON:  Chest x-ray same day. CT chest abdomen and pelvis 12/28/2021. FINDINGS: Cardiovascular: Heart and aorta are normal in size. There is no pericardial effusion. There are atherosclerotic calcifications of the aorta and coronary arteries. Right chest port catheter tip ends in the distal SVC. Again seen is narrowing of the superior vena cava as it approximates the right atrium, unchanged. Mediastinum/Nodes: Enlargement of right hilar and perihilar mass identified in the interval. Discrete margins are difficult to measure secondary to lack of contrast but are proximally 4.6 x 4.9 by 4.9 cm. This extends into the subcarinal space. Calcified subcarinal and right hilar lymph nodes are again seen. The esophagus is nondilated. The thyroid gland is not well seen. No other separate discrete enlarged lymph nodes are identified. Lungs/Pleura: There is new cutoff of the distal right mainstem bronchus. There is subsequent collapse of the right middle lobe and right  lower lobe. There are scattered tree-in-bud opacities throughout the mid and inferior right lung. There is a new 8 mm nodule in the inferior right lung image 2/81. There is a new nodular density measuring 1 cm in the right apex image 4/45. New bilobed nodule with larger component measuring 4 mm in the left upper lobe image 4/15. There is new volume loss on the right with a new small right pleural effusion. Upper Abdomen: Right adrenal nodule measuring up to 3.6 cm appears unchanged. There are punctate left renal calculi without hydronephrosis. Musculoskeletal: No chest wall mass or suspicious bone lesions identified. IMPRESSION: 1. Progression/enlargement of right hilar mass worrisome for disease recurrence/neoplasm. There is new cutoff of the distal right mainstem bronchus with collapse of the right middle lobe and right lower lobe. 2. New small right pleural effusion. 3. Scattered tree-in-bud opacities throughout the right lung may be infectious/inflammatory or neoplastic. 4. There are few new scattered nodular densities in the right lung and left lung apex. Metastatic disease not excluded. 5. Unchanged right adrenal nodule. Electronically Signed   By: Ronney Asters M.D.   On: 07/30/2022 18:29   CT Head Wo Contrast  Result Date: 07/30/2022 CLINICAL DATA:  History of cervical spine fracture common noncompliant EXAM: CT HEAD WITHOUT CONTRAST CT CERVICAL SPINE WITHOUT CONTRAST TECHNIQUE: Multidetector CT imaging of the head and cervical spine was performed following the standard protocol without intravenous contrast. Multiplanar CT image reconstructions of the cervical spine were also generated. RADIATION DOSE REDUCTION: This exam was performed according to the departmental dose-optimization program which includes  automated exposure control, adjustment of the mA and/or kV according to patient size and/or use of iterative reconstruction technique. COMPARISON:  CT August 4 ,2023. FINDINGS: CT HEAD FINDINGS Brain:  Similar appearance of the extensive subcortical and periventricular white matter hypodensities. No evidence of acute infarction, hemorrhage, hydrocephalus, extra-axial collection or mass lesion/mass effect. Vascular: No hyperdense vessel. Atherosclerotic calcifications of the internal and vertebral arteries at the skull base. Skull: Normal. Negative for fracture or focal lesion. Sinuses/Orbits: Visualized portions of the paranasal sinuses are predominantly clear. Orbits are grossly unremarkable. Other: Mastoid air cells are predominantly clear. CT CERVICAL SPINE FINDINGS Alignment: Slight straightening of the normal cervical lordosis. Mild C1 on C2 retrolisthesis is unchanged from prior. Skull base and vertebrae: Slightly comminuted type 2 dens fracture with 4 mm of retrolisthesis is unchanged from prior. Similar appearance of the bilateral nondisplaced C1 posterior arch fractures. Soft tissues and spinal canal: Mild prevertebral swelling at C1-C2 is unchanged from prior. No visible canal hematoma. Disc levels: Multilevel degenerative changes spine unchanged from prior. Upper chest: Biapical pleuroparenchymal scarring. Other: None IMPRESSION: 1. Similar appearance of the type 2 dens fracture with 4 mm of retrolisthesis. 2. No significant interval change in the bilateral nondisplaced fractures of the C1 posterior arch. 3. No acute intracranial abnormality. 4. Similar extensive periventricular and subcortical white matter hypodensities which may be associated with known radiation. Electronically Signed   By: Dahlia Bailiff M.D.   On: 07/30/2022 16:47   CT Cervical Spine Wo Contrast  Result Date: 07/30/2022 CLINICAL DATA:  History of cervical spine fracture common noncompliant EXAM: CT HEAD WITHOUT CONTRAST CT CERVICAL SPINE WITHOUT CONTRAST TECHNIQUE: Multidetector CT imaging of the head and cervical spine was performed following the standard protocol without intravenous contrast. Multiplanar CT image  reconstructions of the cervical spine were also generated. RADIATION DOSE REDUCTION: This exam was performed according to the departmental dose-optimization program which includes automated exposure control, adjustment of the mA and/or kV according to patient size and/or use of iterative reconstruction technique. COMPARISON:  CT August 4 ,2023. FINDINGS: CT HEAD FINDINGS Brain: Similar appearance of the extensive subcortical and periventricular white matter hypodensities. No evidence of acute infarction, hemorrhage, hydrocephalus, extra-axial collection or mass lesion/mass effect. Vascular: No hyperdense vessel. Atherosclerotic calcifications of the internal and vertebral arteries at the skull base. Skull: Normal. Negative for fracture or focal lesion. Sinuses/Orbits: Visualized portions of the paranasal sinuses are predominantly clear. Orbits are grossly unremarkable. Other: Mastoid air cells are predominantly clear. CT CERVICAL SPINE FINDINGS Alignment: Slight straightening of the normal cervical lordosis. Mild C1 on C2 retrolisthesis is unchanged from prior. Skull base and vertebrae: Slightly comminuted type 2 dens fracture with 4 mm of retrolisthesis is unchanged from prior. Similar appearance of the bilateral nondisplaced C1 posterior arch fractures. Soft tissues and spinal canal: Mild prevertebral swelling at C1-C2 is unchanged from prior. No visible canal hematoma. Disc levels: Multilevel degenerative changes spine unchanged from prior. Upper chest: Biapical pleuroparenchymal scarring. Other: None IMPRESSION: 1. Similar appearance of the type 2 dens fracture with 4 mm of retrolisthesis. 2. No significant interval change in the bilateral nondisplaced fractures of the C1 posterior arch. 3. No acute intracranial abnormality. 4. Similar extensive periventricular and subcortical white matter hypodensities which may be associated with known radiation. Electronically Signed   By: Dahlia Bailiff M.D.   On: 07/30/2022  16:47   DG Chest Port 1 View  Result Date: 07/30/2022 CLINICAL DATA:  Altered mental status. Increased lethargy and weakness. EXAM: PORTABLE CHEST 1  VIEW COMPARISON:  CT December 28, 2021. FINDINGS: Patient is rotated. Right chest Port-A-Cath with tip at the superior cavoatrial junction. Displacement of the mediastinum into the right hemithorax is similar to prior and at least somewhat accentuated by technique. Aortic atherosclerosis. Posttreatment change in the right lung. Small right pleural effusion with adjacent atelectasis/infiltrate. No acute osseous abnormality. IMPRESSION: Small right pleural effusion with adjacent atelectasis/infiltrate. Posttreatment change in the right lung. Left lung is clear. Electronically Signed   By: Dahlia Bailiff M.D.   On: 07/30/2022 16:24    Pending Labs Unresulted Labs (From admission, onward)     Start     Ordered   07/31/22 0500  CBC  Tomorrow morning,   R        07/30/22 1934   07/31/22 4970  Basic metabolic panel  Tomorrow morning,   R        07/30/22 1934   07/30/22 1947  Acetylcholine receptor, binding  Once,   R        07/30/22 1946   07/30/22 1942  Lactic acid, plasma  STAT Now then every 3 hours,   R      07/30/22 1941   07/30/22 1933  HIV Antibody (routine testing w rflx)  (HIV Antibody (Routine testing w reflex) panel)  Once,   R        07/30/22 1934   07/30/22 1855  Blood gas, venous  Once,   R        07/30/22 1854   07/30/22 1648  TSH  Once,   URGENT        07/30/22 1647   07/30/22 1535  Urinalysis, Routine w reflex microscopic  Once,   URGENT        07/30/22 1537            Vitals/Pain Today's Vitals   07/30/22 1845 07/30/22 1851 07/30/22 1900 07/30/22 1945  BP: (!) 116/93  (!) 103/50 (!) 129/98  Pulse: 94  (!) 44 91  Resp: 19   17  Temp:  97.9 F (36.6 C)    TempSrc:  Oral    SpO2: 96%  95% 95%  PainSc:        Isolation Precautions No active isolations  Medications Medications  0.9 %  sodium chloride infusion (  Intravenous New Bag/Given 07/30/22 1635)  azithromycin (ZITHROMAX) 500 mg in sodium chloride 0.9 % 250 mL IVPB (has no administration in time range)  oxyCODONE-acetaminophen (PERCOCET/ROXICET) 5-325 MG per tablet 1 tablet (has no administration in time range)  prochlorperazine (COMPAZINE) tablet 10 mg (has no administration in time range)  levothyroxine (SYNTHROID) tablet 112 mcg (has no administration in time range)  diphenoxylate-atropine (LOMOTIL) 2.5-0.025 MG per tablet 1 tablet (has no administration in time range)  ondansetron (ZOFRAN-ODT) disintegrating tablet 8 mg (has no administration in time range)  cyclobenzaprine (FLEXERIL) tablet 5 mg (has no administration in time range)  topiramate (TOPAMAX) tablet 50 mg (has no administration in time range)  fluticasone furoate-vilanterol (BREO ELLIPTA) 200-25 MCG/ACT 1 puff (has no administration in time range)  nystatin (MYCOSTATIN) 100000 UNIT/ML suspension 500,000 Units (has no administration in time range)  enoxaparin (LOVENOX) injection 40 mg (has no administration in time range)  azithromycin (ZITHROMAX) 500 mg in sodium chloride 0.9 % 250 mL IVPB (has no administration in time range)    Followed by  azithromycin (ZITHROMAX) tablet 500 mg (has no administration in time range)  ipratropium-albuterol (DUONEB) 0.5-2.5 (3) MG/3ML nebulizer solution 3 mL (has no administration in  time range)  albuterol (PROVENTIL) (2.5 MG/3ML) 0.083% nebulizer solution 2.5 mg (has no administration in time range)  predniSONE (DELTASONE) tablet 40 mg (has no administration in time range)  oxyCODONE-acetaminophen (PERCOCET/ROXICET) 5-325 MG per tablet 1 tablet (1 tablet Oral Given 07/30/22 1525)  sodium chloride 0.9 % bolus 500 mL (0 mLs Intravenous Stopped 07/30/22 1819)  cefTRIAXone (ROCEPHIN) 1 g in sodium chloride 0.9 % 100 mL IVPB (1 g Intravenous New Bag/Given 07/30/22 1850)    Mobility walks with person assist High fall risk   Focused  Assessments Pulmonary Assessment Handoff:  Lung sounds:          R Recommendations: See Admitting Provider Note  Report given to:   Additional Notes:

## 2022-07-30 NOTE — H&P (Signed)
History and Physical    [name] FVC:[id number] DOB: 07/12/1951 DOA: 07/30/2022  PCP: Mckinley Jewel, MD (Confirm with patient/family/NH records and if not entered, this has to be entered at [facility] point of entry) Patient coming from: Home  I have personally briefly reviewed patient's old medical records in [place]  Chief Complaint: Cough, wheezing, feeling weak  HPI: Tammy Bowers is a 71 y.o. female with medical history significant of right-sided small cell lung cancer off chemo and immunotherapy, COPD, chronic ambulation dysfunction on roller walker, chronic C1 dens fracture noncompliant with neck collar, sequestrum, narcotic dependence, presented with worsening of cough wheezing and shortness of breath and generalized weakness.  Symptoms started 2 weeks ago and gradually getting worse, husband reported patient has had decreased exercise tolerance, walking inside the house because significant shortness of breath and had to stop frequently to catch breath.  Wheezing, and run out of the elevator only uses as needed albuterol at home.  No fever chills no chest pains.  This week, patient has been feeling extremely weak, no fall.  Husband also report significant decrease of appetite and oral thrush.  She went to see oncology on Monday, who diagnosed patient with oral thrush and started her on nystatin p.o. every 6 hours.  And patient lost her teeth and has been on liquid diet but reported no swallowing problems or odynophagia.  Denies any abdominal pain or diarrhea.  ED Course: Tachycardia, and tachypneic, O2 saturation 94% on room air.  Easily desats with minimal activity, blood pressure stable afebrile.  CT head negative for acute finding CT cervical spine showed chronic C1 dens fracture.  CT chest showed progressive of right failure mass with signs of infection versus tumor progress.  Review of Systems: As per HPI otherwise 14 point review of systems negative.   Past Medical History:   Diagnosis Date   Adenopathy 10/10/2016   PERICARINAL   Anemia    Arthritis    Asthma    COPD (chronic obstructive pulmonary disease) (HCC)    Dyspnea    Headache    migraines   Hilar mass 10/10/2016   RIGHT   History of kidney stones    Insomnia    Lung nodules 10/10/2016   RIGHT LOWER LOBE   Mass of both adrenal glands ([place]) 10/10/2016   Mood swing    Pneumonia    SCL CA dx'd 2017   lung cancer Small Cell; recurrence 10/2018   Spinal headache    Substance abuse ([place])    TOBACCO   UNSPECIFIED INFECTION OF BONE ANKLE AND FOOT 10/22/2009   Annotation: left ankle Qualifier: Diagnosis of  By: Gustavo Lah      Past Surgical History:  Procedure Laterality Date   ANKLE SURGERY     hardware removal, and initial ankle surgery   APPENDECTOMY     IR IMAGING GUIDED PORT INSERTION  04/12/2019   LUNG BIOPSY N/A 10/19/2016   Procedure: LUNG BIOPSY;  Surgeon: Grace Isaac, MD;  Location: [place];  Service: Thoracic;  Laterality: N/A;   LUNG BIOPSY N/A 02/20/2019   Procedure: LUNG BIOPSY;  Surgeon: Grace Isaac, MD;  Location: [place];  Service: Thoracic;  Laterality: N/A;   TONSILLECTOMY     VIDEO BRONCHOSCOPY WITH ENDOBRONCHIAL ULTRASOUND N/A 10/19/2016   Procedure: VIDEO BRONCHOSCOPY WITH ENDOBRONCHIAL ULTRASOUND;  Surgeon: Grace Isaac, MD;  Location: [place];  Service: Thoracic;  Laterality: N/A;   VIDEO BRONCHOSCOPY WITH ENDOBRONCHIAL ULTRASOUND N/A 02/20/2019  Procedure: VIDEO BRONCHOSCOPY WITH ENDOBRONCHIAL ULTRASOUND;  Surgeon: Grace Isaac, MD;  Location: [place];  Service: Thoracic;  Laterality: N/A;     reports that she has been smoking cigarettes. She has a 50.00 pack-year smoking history. She has never used smokeless tobacco. She reports current alcohol use of about 7.0 standard drinks of alcohol per week. She reports that she does not currently use drugs after having used the following drugs: Marijuana.  Allergies  Allergen Reactions   Lidocaine  Swelling    SWELLING REACTION UNSPECIFIED     Family History  Problem Relation Age of Onset   Heart disease Father    Colon cancer Father    Heart disease Paternal Grandfather    Ovarian cancer Mother      Prior to Admission medications   Medication Sig Start Date End Date Taking? Authorizing Provider  albuterol (PROVENTIL HFA;VENTOLIN HFA) 108 (90 BASE) MCG/ACT inhaler Inhale 2 puffs into the lungs every 4 (four) hours as needed for wheezing.   Yes [provider]  cyclobenzaprine (FLEXERIL) 5 MG tablet Take 1 tablet by mouth 3 times/day as needed-between meals & bedtime.   Yes [provider]  diphenoxylate-atropine (LOMOTIL) 2.5-0.025 MG tablet Take 1 tablet by mouth 4 (four) times daily as needed for diarrhea or loose stools. 03/30/19  Yes Wyatt Portela, MD  Fluticasone-Salmeterol (ADVAIR) 500-50 MCG/DOSE AEPB Inhale 1 puff into the lungs 2 (two) times daily.   Yes [provider]  levothyroxine (SYNTHROID) 112 MCG tablet TAKE 1 TABLET(112 MCG) BY MOUTH DAILY BEFORE AND BREAKFAST Patient taking differently: Take 112 mcg by mouth daily before breakfast. 12/21/21  Yes Orson Slick, MD  nystatin (MYCOSTATIN) 100000 UNIT/ML suspension Take 5 mLs (500,000 Units total) by mouth 4 (four) times daily. 07/26/22  Yes Dede Query T, PA-C  ondansetron (ZOFRAN-ODT) 8 MG disintegrating tablet Take 1 tablet (8 mg total) by mouth every 8 (eight) hours as needed for nausea or vomiting. 07/26/22  Yes Dede Query T, PA-C  OVER THE COUNTER MEDICATION 1 capsule. [place] tail-"mushroom in a capsule"    Yes [provider]  oxyCODONE-acetaminophen (PERCOCET/ROXICET) 5-325 MG tablet Take 1 tablet by mouth every 6 (six) hours as needed for severe pain. 07/22/22  Yes Horton, Barbette Hair, MD  prochlorperazine (COMPAZINE) 10 MG tablet TAKE 1 TABLET(10 MG) BY MOUTH EVERY 6 HOURS AS NEEDED FOR NAUSEA OR VOMITING Patient taking differently: Take 10 mg by mouth every 6 (six) hours  as needed for vomiting or nausea. 03/08/19  Yes Curt Bears, MD  topiramate (TOPAMAX) 50 MG tablet TAKE 1 TABLET(50 MG) BY MOUTH TWICE DAILY Patient taking differently: Take 50 mg by mouth 2 (two) times daily. TAKE 1 TABLET(50 MG) BY MOUTH TWICE DAILY 03/29/22  Yes Suzzanne Cloud, NP  varenicline (CHANTIX PAK) 0.5 MG X 11 & 1 MG X 42 tablet Take 2 tablets by mouth daily. 02/21/22  Yes [provider]  acetaminophen-codeine (TYLENOL #3) 300-30 MG tablet Take 1 tablet by mouth every 8 (eight) hours as needed for moderate pain. Patient not taking: Reported on 07/30/2022 09/13/21   Orson Slick, MD  ALPRAZolam Duanne Moron) 0.5 MG tablet Take 2 tablets approximately 45 minutes prior to the MRI study, take a third tablet if needed. Patient not taking: Reported on 07/30/2022 03/29/22   Suzzanne Cloud, NP  guaiFENesin-codeine [facility]) 100-10 MG/5ML syrup Take 5 mLs by mouth 3 (three) times daily as needed for cough. Patient not taking: Reported on  07/30/2022 09/13/21   Orson Slick, MD  HYDROcodone-acetaminophen (HYCET) 7.5-325 mg/15 ml solution Take 10 mLs by mouth 4 (four) times daily as needed for moderate pain (use before meals and at bedtime prn). Patient not taking: Reported on 07/30/2022 09/15/21   Orson Slick, MD    Physical Exam: Vitals:   07/30/22 1745 07/30/22 1845 07/30/22 1851 07/30/22 1900  BP: (!) 124/95 (!) 116/93  (!) 103/50  Pulse: 94 94  (!) 44  Resp: 19 19    Temp:   97.9 F (36.6 C)   TempSrc:   Oral   SpO2: 96% 96%  95%    Constitutional: NAD, calm, comfortable Vitals:   07/30/22 1745 07/30/22 1845 07/30/22 1851 07/30/22 1900  BP: (!) 124/95 (!) 116/93  (!) 103/50  Pulse: 94 94  (!) 44  Resp: 19 19    Temp:   97.9 F (36.6 C)   TempSrc:   Oral   SpO2: 96% 96%  95%   Eyes: PERRL, lids and conjunctivae normal ENMT: Mucous membranes are moist. Posterior pharynx clear of any exudate or lesions.Normal dentition.  Neck: normal, supple, no masses, no  thyromegaly Respiratory: clear to auscultation bilaterally, diffused wheezing, no crackles.  Increasing respiratory effort. No accessory muscle use.  Cardiovascular: Regular rate and rhythm, no murmurs / rubs / gallops. No extremity edema. 2+ pedal pulses. No carotid bruits.  Abdomen: no tenderness, no masses palpated. No hepatosplenomegaly. Bowel sounds positive.  Musculoskeletal: no clubbing / cyanosis. No joint deformity upper and lower extremities. Good ROM, no contractures. Normal muscle tone.  Skin: no rashes, lesions, ulcers. No induration Neurologic: CN 2-12 grossly intact. Sensation intact, DTR normal. Strength 5/5 in all 4.  Psychiatric: Awake, lethargic   Labs on Admission: I have personally reviewed following labs and imaging studies  CBC: Recent Labs  Lab 07/26/22 1404 07/30/22 1535 07/30/22 1904  WBC 8.2 8.5  --   NEUTROABS 6.7 7.5  --   HGB 13.4 14.2 17.0*  HCT 38.8 41.6 50.0*  MCV 93.5 94.1  --   PLT 348 407*  --    Basic Metabolic Panel: Recent Labs  Lab 07/26/22 1404 07/30/22 1535 07/30/22 1904  NA 137 135 134*  K 3.8 3.7 3.8  CL 105 105  --   CO2 25 20*  --   GLUCOSE 97 91  --   BUN 18 20  --   CREATININE 1.20* 1.21*  --   CALCIUM 9.0 8.9  --   MG  --  1.7  --    GFR: Estimated Creatinine Clearance: 46.7 mL/min (A) (by C-G formula based on SCr of 1.21 mg/dL (H)). Liver Function Tests: Recent Labs  Lab 07/26/22 1404 07/30/22 1535  AST 20 21  ALT 13 17  ALKPHOS 139* 153*  BILITOT 0.4 0.7  PROT 7.1 6.7  ALBUMIN 3.4* 2.7*   Recent Labs  Lab 07/30/22 1535  LIPASE 30   No results for input(s): "AMMONIA" in the last 168 hours. Coagulation Profile: No results for input(s): "INR", "PROTIME" in the last 168 hours. Cardiac Enzymes: No results for input(s): "CKTOTAL", "CKMB", "CKMBINDEX", "TROPONINI" in the last 168 hours. BNP (last 3 results) No results for input(s): "PROBNP" in the last 8760 hours. HbA1C: No results for input(s): "HGBA1C"  in the last 72 hours. CBG: No results for input(s): "GLUCAP" in the last 168 hours. Lipid Profile: No results for input(s): "CHOL", "HDL", "LDLCALC", "TRIG", "CHOLHDL", "LDLDIRECT" in the last 72 hours. Thyroid Function Tests:  No results for input(s): "TSH", "T4TOTAL", "FREET4", "T3FREE", "THYROIDAB" in the last 72 hours. Anemia Panel: No results for input(s): "VITAMINB12", "FOLATE", "FERRITIN", "TIBC", "IRON", "RETICCTPCT" in the last 72 hours. Urine analysis:    Component Value Date/Time   COLORURINE YELLOW 11/21/2016 1833   APPEARANCEUR CLEAR 11/21/2016 1833   LABSPEC 1.016 11/21/2016 1833   PHURINE 5.5 11/21/2016 1833   GLUCOSEU NEGATIVE 11/21/2016 1833   HGBUR NEGATIVE 11/21/2016 1833   BILIRUBINUR NEGATIVE 11/21/2016 1833   KETONESUR NEGATIVE 11/21/2016 1833   PROTEINUR NEGATIVE 11/21/2016 1833   NITRITE NEGATIVE 11/21/2016 1833   LEUKOCYTESUR NEGATIVE 11/21/2016 1833    Radiological Exams on Admission: CT Chest Wo Contrast  Result Date: 07/30/2022 CLINICAL DATA:  Pneumonia. EXAM: CT CHEST WITHOUT CONTRAST TECHNIQUE: Multidetector CT imaging of the chest was performed following the standard protocol without IV contrast. RADIATION DOSE REDUCTION: This exam was performed according to the departmental dose-optimization program which includes automated exposure control, adjustment of the mA and/or kV according to patient size and/or use of iterative reconstruction technique. COMPARISON:  Chest x-ray same day. CT chest abdomen and pelvis 12/28/2021. FINDINGS: Cardiovascular: Heart and aorta are normal in size. There is no pericardial effusion. There are atherosclerotic calcifications of the aorta and coronary arteries. Right chest port catheter tip ends in the distal SVC. Again seen is narrowing of the superior vena cava as it approximates the right atrium, unchanged. Mediastinum/Nodes: Enlargement of right hilar and perihilar mass identified in the interval. Discrete margins are  difficult to measure secondary to lack of contrast but are proximally 4.6 x 4.9 by 4.9 cm. This extends into the subcarinal space. Calcified subcarinal and right hilar lymph nodes are again seen. The esophagus is nondilated. The thyroid gland is not well seen. No other separate discrete enlarged lymph nodes are identified. Lungs/Pleura: There is new cutoff of the distal right mainstem bronchus. There is subsequent collapse of the right middle lobe and right lower lobe. There are scattered tree-in-bud opacities throughout the mid and inferior right lung. There is a new 8 mm nodule in the inferior right lung image 2/81. There is a new nodular density measuring 1 cm in the right apex image 4/45. New bilobed nodule with larger component measuring 4 mm in the left upper lobe image 4/15. There is new volume loss on the right with a new small right pleural effusion. Upper Abdomen: Right adrenal nodule measuring up to 3.6 cm appears unchanged. There are punctate left renal calculi without hydronephrosis. Musculoskeletal: No chest wall mass or suspicious bone lesions identified. IMPRESSION: 1. Progression/enlargement of right hilar mass worrisome for disease recurrence/neoplasm. There is new cutoff of the distal right mainstem bronchus with collapse of the right middle lobe and right lower lobe. 2. New small right pleural effusion. 3. Scattered tree-in-bud opacities throughout the right lung may be infectious/inflammatory or neoplastic. 4. There are few new scattered nodular densities in the right lung and left lung apex. Metastatic disease not excluded. 5. Unchanged right adrenal nodule. Electronically Signed   By: Ronney Asters M.D.   On: 07/30/2022 18:29   CT Head Wo Contrast  Result Date: 07/30/2022 CLINICAL DATA:  History of cervical spine fracture common noncompliant EXAM: CT HEAD WITHOUT CONTRAST CT CERVICAL SPINE WITHOUT CONTRAST TECHNIQUE: Multidetector CT imaging of the head and cervical spine was performed  following the standard protocol without intravenous contrast. Multiplanar CT image reconstructions of the cervical spine were also generated. RADIATION DOSE REDUCTION: This exam was performed according to the departmental dose-optimization program  which includes automated exposure control, adjustment of the mA and/or kV according to patient size and/or use of iterative reconstruction technique. COMPARISON:  CT August 4 ,2023. FINDINGS: CT HEAD FINDINGS Brain: Similar appearance of the extensive subcortical and periventricular white matter hypodensities. No evidence of acute infarction, hemorrhage, hydrocephalus, extra-axial collection or mass lesion/mass effect. Vascular: No hyperdense vessel. Atherosclerotic calcifications of the internal and vertebral arteries at the skull base. Skull: Normal. Negative for fracture or focal lesion. Sinuses/Orbits: Visualized portions of the paranasal sinuses are predominantly clear. Orbits are grossly unremarkable. Other: Mastoid air cells are predominantly clear. CT CERVICAL SPINE FINDINGS Alignment: Slight straightening of the normal cervical lordosis. Mild C1 on C2 retrolisthesis is unchanged from prior. Skull base and vertebrae: Slightly comminuted type 2 dens fracture with 4 mm of retrolisthesis is unchanged from prior. Similar appearance of the bilateral nondisplaced C1 posterior arch fractures. Soft tissues and spinal canal: Mild prevertebral swelling at C1-C2 is unchanged from prior. No visible canal hematoma. Disc levels: Multilevel degenerative changes spine unchanged from prior. Upper chest: Biapical pleuroparenchymal scarring. Other: None IMPRESSION: 1. Similar appearance of the type 2 dens fracture with 4 mm of retrolisthesis. 2. No significant interval change in the bilateral nondisplaced fractures of the C1 posterior arch. 3. No acute intracranial abnormality. 4. Similar extensive periventricular and subcortical white matter hypodensities which may be associated  with known radiation. Electronically Signed   By: Dahlia Bailiff M.D.   On: 07/30/2022 16:47   CT Cervical Spine Wo Contrast  Result Date: 07/30/2022 CLINICAL DATA:  History of cervical spine fracture common noncompliant EXAM: CT HEAD WITHOUT CONTRAST CT CERVICAL SPINE WITHOUT CONTRAST TECHNIQUE: Multidetector CT imaging of the head and cervical spine was performed following the standard protocol without intravenous contrast. Multiplanar CT image reconstructions of the cervical spine were also generated. RADIATION DOSE REDUCTION: This exam was performed according to the departmental dose-optimization program which includes automated exposure control, adjustment of the mA and/or kV according to patient size and/or use of iterative reconstruction technique. COMPARISON:  CT August 4 ,2023. FINDINGS: CT HEAD FINDINGS Brain: Similar appearance of the extensive subcortical and periventricular white matter hypodensities. No evidence of acute infarction, hemorrhage, hydrocephalus, extra-axial collection or mass lesion/mass effect. Vascular: No hyperdense vessel. Atherosclerotic calcifications of the internal and vertebral arteries at the skull base. Skull: Normal. Negative for fracture or focal lesion. Sinuses/Orbits: Visualized portions of the paranasal sinuses are predominantly clear. Orbits are grossly unremarkable. Other: Mastoid air cells are predominantly clear. CT CERVICAL SPINE FINDINGS Alignment: Slight straightening of the normal cervical lordosis. Mild C1 on C2 retrolisthesis is unchanged from prior. Skull base and vertebrae: Slightly comminuted type 2 dens fracture with 4 mm of retrolisthesis is unchanged from prior. Similar appearance of the bilateral nondisplaced C1 posterior arch fractures. Soft tissues and spinal canal: Mild prevertebral swelling at C1-C2 is unchanged from prior. No visible canal hematoma. Disc levels: Multilevel degenerative changes spine unchanged from prior. Upper chest: Biapical  pleuroparenchymal scarring. Other: None IMPRESSION: 1. Similar appearance of the type 2 dens fracture with 4 mm of retrolisthesis. 2. No significant interval change in the bilateral nondisplaced fractures of the C1 posterior arch. 3. No acute intracranial abnormality. 4. Similar extensive periventricular and subcortical white matter hypodensities which may be associated with known radiation. Electronically Signed   By: Dahlia Bailiff M.D.   On: 07/30/2022 16:47   DG Chest Port 1 View  Result Date: 07/30/2022 CLINICAL DATA:  Altered mental status. Increased lethargy and weakness. EXAM: PORTABLE CHEST  1 VIEW COMPARISON:  CT December 28, 2021. FINDINGS: Patient is rotated. Right chest Port-A-Cath with tip at the superior cavoatrial junction. Displacement of the mediastinum into the right hemithorax is similar to prior and at least somewhat accentuated by technique. Aortic atherosclerosis. Posttreatment change in the right lung. Small right pleural effusion with adjacent atelectasis/infiltrate. No acute osseous abnormality. IMPRESSION: Small right pleural effusion with adjacent atelectasis/infiltrate. Posttreatment change in the right lung. Left lung is clear. Electronically Signed   By: Dahlia Bailiff M.D.   On: 07/30/2022 16:24    EKG: Independently reviewed.  Sinus, no acute ST changes.  Assessment/Plan Principal Problem:   COPD (chronic obstructive pulmonary disease) ([place])  (please populate well all problems here in Problem List. (For example, if patient is on BP meds at home and you resume or decide to hold them, it is a problem that needs to be her. Same for CAD, COPD, HLD and so on)  Acute COPD exacerbation -Likely related to ongoing smoking -Short course of p.o. steroid, resume LABA and inhaled steroid, continue DuoNebs and as needed albuterol. -Short course of p.o. azithromycin.  Progressive right small cell lung CA -Briefly discussed with patient and her husband regarding the CT finding  today.  Consult oncology Dr. Lorenso Courier to talk to the patient and her family regarding today's finding.  Generalized weakness -Unclear etiology, rule out Lambert-Eaton in room, check antiacetylcholine antibody titer. -PT evaluation -Other DDx, VBG to rule out CO2 retention. Echo to rule out pulmonary hypertension given poorly controlled COPD. -PT evaluation  Right lung small cell carcinoma -In remission, new finding on CT chest, consult oncology.  Oral thrush -Improving, continue nystatin every 6 hours  Cigar smoker -Obesity patient has no plan to quit smoking.  Cessation education performed at bedside.  Hypothyroidism -TSH pending.  DVT prophylaxis: Lovenox Code Status: Full code Family Communication: Husband at bedside Disposition Plan: Expect less than 2 midnight hospital stay Consults called: Oncology Admission status: MedSurg observation   Lequita Halt MD [facility] Pager [phone]  07/30/2022, 7:35 PM

## 2022-07-30 NOTE — ED Provider Notes (Signed)
[facility] EMERGENCY DEPARTMENT Provider Note   CSN: [id number] Arrival date & time: 07/30/22  1436     History  Chief Complaint  Patient presents with   Lytle Michaels    Tammy Bowers is a 71 y.o. female.  Patient brought in by EMS.  Husband called for increased lethargy weakness altered mental status.  Patient had a fall on August 4 which showed evidence of a odontoid and C1 fracture that required cervical collar.  Patient takes cervical collar off frequently according to her husband.  Patient was in the recliner she was up using her walker.  No fall since August 4.  In the recliner patient seemed not to be able to move her lower extremities.  Patient now currently moving everything.  And is talking.  Past medical history significant for small cell lung cancer first diagnosed in 2018 has been through several cycles.  In September 2022 dropped off from care.  Recently reestablished care and was seen August 8 at the cancer center.  Plan was to get her reestablished with Dr. Lorenso Courier they are planning follow-up CT scans and then reevaluation on treatment.  Patient pacifically stated that she not sure if she wants to pursue treatment at this time but she would discuss that with him.  Patient also has COPD.  Patient is still an active smoker.  Has not noticed any increase shortness of breath but she is known to have COPD but she has had increased weakness.       Home Medications Prior to Admission medications   Medication Sig Start Date End Date Taking? Authorizing Provider  albuterol (PROVENTIL HFA;VENTOLIN HFA) 108 (90 BASE) MCG/ACT inhaler Inhale 2 puffs into the lungs every 4 (four) hours as needed for wheezing.   Yes [provider]  cyclobenzaprine (FLEXERIL) 5 MG tablet Take 1 tablet by mouth 3 times/day as needed-between meals & bedtime.   Yes [provider]  diphenoxylate-atropine (LOMOTIL) 2.5-0.025 MG tablet Take 1 tablet by mouth 4 (four) times daily as  needed for diarrhea or loose stools. 03/30/19  Yes Wyatt Portela, MD  Fluticasone-Salmeterol (ADVAIR) 500-50 MCG/DOSE AEPB Inhale 1 puff into the lungs 2 (two) times daily.   Yes [provider]  levothyroxine (SYNTHROID) 112 MCG tablet TAKE 1 TABLET(112 MCG) BY MOUTH DAILY BEFORE AND BREAKFAST Patient taking differently: Take 112 mcg by mouth daily before breakfast. 12/21/21  Yes Orson Slick, MD  nystatin (MYCOSTATIN) 100000 UNIT/ML suspension Take 5 mLs (500,000 Units total) by mouth 4 (four) times daily. 07/26/22  Yes Dede Query T, PA-C  ondansetron (ZOFRAN-ODT) 8 MG disintegrating tablet Take 1 tablet (8 mg total) by mouth every 8 (eight) hours as needed for nausea or vomiting. 07/26/22  Yes Dede Query T, PA-C  OVER THE COUNTER MEDICATION 1 capsule. [place] tail-"mushroom in a capsule"    Yes [provider]  oxyCODONE-acetaminophen (PERCOCET/ROXICET) 5-325 MG tablet Take 1 tablet by mouth every 6 (six) hours as needed for severe pain. 07/22/22  Yes Horton, Barbette Hair, MD  prochlorperazine (COMPAZINE) 10 MG tablet TAKE 1 TABLET(10 MG) BY MOUTH EVERY 6 HOURS AS NEEDED FOR NAUSEA OR VOMITING Patient taking differently: Take 10 mg by mouth every 6 (six) hours as needed for vomiting or nausea. 03/08/19  Yes Curt Bears, MD  topiramate (TOPAMAX) 50 MG tablet TAKE 1 TABLET(50 MG) BY MOUTH TWICE DAILY Patient taking differently: Take 50 mg by mouth 2 (two) times daily. TAKE 1 TABLET(50 MG) BY MOUTH TWICE DAILY  03/29/22  Yes Suzzanne Cloud, NP  varenicline (CHANTIX PAK) 0.5 MG X 11 & 1 MG X 42 tablet Take 2 tablets by mouth daily. 02/21/22  Yes [provider]  acetaminophen-codeine (TYLENOL #3) 300-30 MG tablet Take 1 tablet by mouth every 8 (eight) hours as needed for moderate pain. Patient not taking: Reported on 07/30/2022 09/13/21   Orson Slick, MD  ALPRAZolam Duanne Moron) 0.5 MG tablet Take 2 tablets approximately 45 minutes prior to the MRI study, take a third tablet  if needed. Patient not taking: Reported on 07/30/2022 03/29/22   Suzzanne Cloud, NP  guaiFENesin-codeine [facility]) 100-10 MG/5ML syrup Take 5 mLs by mouth 3 (three) times daily as needed for cough. Patient not taking: Reported on 07/30/2022 09/13/21   Orson Slick, MD  HYDROcodone-acetaminophen (HYCET) 7.5-325 mg/15 ml solution Take 10 mLs by mouth 4 (four) times daily as needed for moderate pain (use before meals and at bedtime prn). Patient not taking: Reported on 07/30/2022 09/15/21   Orson Slick, MD      Allergies    Lidocaine    Review of Systems   Review of Systems  Unable to perform ROS: Mental status change    Physical Exam Updated Vital Signs BP (!) 116/93   Pulse 94   Temp 97.9 F (36.6 C) (Oral)   Resp 19   SpO2 96%  Physical Exam Vitals and nursing note reviewed.  Constitutional:      General: She is not in acute distress.    Appearance: Normal appearance. She is well-developed.  HENT:     Head: Normocephalic and atraumatic.  Eyes:     Extraocular Movements: Extraocular movements intact.     Conjunctiva/sclera: Conjunctivae normal.     Pupils: Pupils are equal, round, and reactive to light.  Neck:     Comments: Collar in place Cardiovascular:     Rate and Rhythm: Normal rate and regular rhythm.     Heart sounds: No murmur heard. Pulmonary:     Effort: Pulmonary effort is normal. No respiratory distress.     Breath sounds: Rhonchi present.  Abdominal:     Palpations: Abdomen is soft.     Tenderness: There is no abdominal tenderness.  Musculoskeletal:        General: No swelling.  Skin:    General: Skin is warm and dry.     Capillary Refill: Capillary refill takes less than 2 seconds.  Neurological:     General: No focal deficit present.     Mental Status: She is alert.     Cranial Nerves: No cranial nerve deficit.     Sensory: No sensory deficit.     Motor: No weakness.  Psychiatric:        Mood and Affect: Mood normal.     ED  Results / Procedures / Treatments   Labs (all labs ordered are listed, but only abnormal results are displayed) Labs Reviewed  CBC WITH DIFFERENTIAL/PLATELET - Abnormal; Notable for the following components:      Result Value   Platelets 407 (*)    Lymphs Abs 0.5 (*)    All other components within normal limits  COMPREHENSIVE METABOLIC PANEL - Abnormal; Notable for the following components:   CO2 20 (*)    Creatinine, Ser 1.21 (*)    Albumin 2.7 (*)    Alkaline Phosphatase 153 (*)    GFR, Estimated 49 (*)    All other components within normal limits  LIPASE, BLOOD  MAGNESIUM  URINALYSIS, ROUTINE W REFLEX MICROSCOPIC  TSH  BLOOD GAS, VENOUS    EKG None  Radiology CT Chest Wo Contrast  Result Date: 07/30/2022 CLINICAL DATA:  Pneumonia. EXAM: CT CHEST WITHOUT CONTRAST TECHNIQUE: Multidetector CT imaging of the chest was performed following the standard protocol without IV contrast. RADIATION DOSE REDUCTION: This exam was performed according to the departmental dose-optimization program which includes automated exposure control, adjustment of the mA and/or kV according to patient size and/or use of iterative reconstruction technique. COMPARISON:  Chest x-ray same day. CT chest abdomen and pelvis 12/28/2021. FINDINGS: Cardiovascular: Heart and aorta are normal in size. There is no pericardial effusion. There are atherosclerotic calcifications of the aorta and coronary arteries. Right chest port catheter tip ends in the distal SVC. Again seen is narrowing of the superior vena cava as it approximates the right atrium, unchanged. Mediastinum/Nodes: Enlargement of right hilar and perihilar mass identified in the interval. Discrete margins are difficult to measure secondary to lack of contrast but are proximally 4.6 x 4.9 by 4.9 cm. This extends into the subcarinal space. Calcified subcarinal and right hilar lymph nodes are again seen. The esophagus is nondilated. The thyroid gland is not well  seen. No other separate discrete enlarged lymph nodes are identified. Lungs/Pleura: There is new cutoff of the distal right mainstem bronchus. There is subsequent collapse of the right middle lobe and right lower lobe. There are scattered tree-in-bud opacities throughout the mid and inferior right lung. There is a new 8 mm nodule in the inferior right lung image 2/81. There is a new nodular density measuring 1 cm in the right apex image 4/45. New bilobed nodule with larger component measuring 4 mm in the left upper lobe image 4/15. There is new volume loss on the right with a new small right pleural effusion. Upper Abdomen: Right adrenal nodule measuring up to 3.6 cm appears unchanged. There are punctate left renal calculi without hydronephrosis. Musculoskeletal: No chest wall mass or suspicious bone lesions identified. IMPRESSION: 1. Progression/enlargement of right hilar mass worrisome for disease recurrence/neoplasm. There is new cutoff of the distal right mainstem bronchus with collapse of the right middle lobe and right lower lobe. 2. New small right pleural effusion. 3. Scattered tree-in-bud opacities throughout the right lung may be infectious/inflammatory or neoplastic. 4. There are few new scattered nodular densities in the right lung and left lung apex. Metastatic disease not excluded. 5. Unchanged right adrenal nodule. Electronically Signed   By: Ronney Asters M.D.   On: 07/30/2022 18:29   CT Head Wo Contrast  Result Date: 07/30/2022 CLINICAL DATA:  History of cervical spine fracture common noncompliant EXAM: CT HEAD WITHOUT CONTRAST CT CERVICAL SPINE WITHOUT CONTRAST TECHNIQUE: Multidetector CT imaging of the head and cervical spine was performed following the standard protocol without intravenous contrast. Multiplanar CT image reconstructions of the cervical spine were also generated. RADIATION DOSE REDUCTION: This exam was performed according to the departmental dose-optimization program which  includes automated exposure control, adjustment of the mA and/or kV according to patient size and/or use of iterative reconstruction technique. COMPARISON:  CT August 4 ,2023. FINDINGS: CT HEAD FINDINGS Brain: Similar appearance of the extensive subcortical and periventricular white matter hypodensities. No evidence of acute infarction, hemorrhage, hydrocephalus, extra-axial collection or mass lesion/mass effect. Vascular: No hyperdense vessel. Atherosclerotic calcifications of the internal and vertebral arteries at the skull base. Skull: Normal. Negative for fracture or focal lesion. Sinuses/Orbits: Visualized portions of the paranasal sinuses are predominantly  clear. Orbits are grossly unremarkable. Other: Mastoid air cells are predominantly clear. CT CERVICAL SPINE FINDINGS Alignment: Slight straightening of the normal cervical lordosis. Mild C1 on C2 retrolisthesis is unchanged from prior. Skull base and vertebrae: Slightly comminuted type 2 dens fracture with 4 mm of retrolisthesis is unchanged from prior. Similar appearance of the bilateral nondisplaced C1 posterior arch fractures. Soft tissues and spinal canal: Mild prevertebral swelling at C1-C2 is unchanged from prior. No visible canal hematoma. Disc levels: Multilevel degenerative changes spine unchanged from prior. Upper chest: Biapical pleuroparenchymal scarring. Other: None IMPRESSION: 1. Similar appearance of the type 2 dens fracture with 4 mm of retrolisthesis. 2. No significant interval change in the bilateral nondisplaced fractures of the C1 posterior arch. 3. No acute intracranial abnormality. 4. Similar extensive periventricular and subcortical white matter hypodensities which may be associated with known radiation. Electronically Signed   By: Dahlia Bailiff M.D.   On: 07/30/2022 16:47   CT Cervical Spine Wo Contrast  Result Date: 07/30/2022 CLINICAL DATA:  History of cervical spine fracture common noncompliant EXAM: CT HEAD WITHOUT CONTRAST  CT CERVICAL SPINE WITHOUT CONTRAST TECHNIQUE: Multidetector CT imaging of the head and cervical spine was performed following the standard protocol without intravenous contrast. Multiplanar CT image reconstructions of the cervical spine were also generated. RADIATION DOSE REDUCTION: This exam was performed according to the departmental dose-optimization program which includes automated exposure control, adjustment of the mA and/or kV according to patient size and/or use of iterative reconstruction technique. COMPARISON:  CT August 4 ,2023. FINDINGS: CT HEAD FINDINGS Brain: Similar appearance of the extensive subcortical and periventricular white matter hypodensities. No evidence of acute infarction, hemorrhage, hydrocephalus, extra-axial collection or mass lesion/mass effect. Vascular: No hyperdense vessel. Atherosclerotic calcifications of the internal and vertebral arteries at the skull base. Skull: Normal. Negative for fracture or focal lesion. Sinuses/Orbits: Visualized portions of the paranasal sinuses are predominantly clear. Orbits are grossly unremarkable. Other: Mastoid air cells are predominantly clear. CT CERVICAL SPINE FINDINGS Alignment: Slight straightening of the normal cervical lordosis. Mild C1 on C2 retrolisthesis is unchanged from prior. Skull base and vertebrae: Slightly comminuted type 2 dens fracture with 4 mm of retrolisthesis is unchanged from prior. Similar appearance of the bilateral nondisplaced C1 posterior arch fractures. Soft tissues and spinal canal: Mild prevertebral swelling at C1-C2 is unchanged from prior. No visible canal hematoma. Disc levels: Multilevel degenerative changes spine unchanged from prior. Upper chest: Biapical pleuroparenchymal scarring. Other: None IMPRESSION: 1. Similar appearance of the type 2 dens fracture with 4 mm of retrolisthesis. 2. No significant interval change in the bilateral nondisplaced fractures of the C1 posterior arch. 3. No acute intracranial  abnormality. 4. Similar extensive periventricular and subcortical white matter hypodensities which may be associated with known radiation. Electronically Signed   By: Dahlia Bailiff M.D.   On: 07/30/2022 16:47   DG Chest Port 1 View  Result Date: 07/30/2022 CLINICAL DATA:  Altered mental status. Increased lethargy and weakness. EXAM: PORTABLE CHEST 1 VIEW COMPARISON:  CT December 28, 2021. FINDINGS: Patient is rotated. Right chest Port-A-Cath with tip at the superior cavoatrial junction. Displacement of the mediastinum into the right hemithorax is similar to prior and at least somewhat accentuated by technique. Aortic atherosclerosis. Posttreatment change in the right lung. Small right pleural effusion with adjacent atelectasis/infiltrate. No acute osseous abnormality. IMPRESSION: Small right pleural effusion with adjacent atelectasis/infiltrate. Posttreatment change in the right lung. Left lung is clear. Electronically Signed   By: Andree Moro.D.  On: 07/30/2022 16:24    Procedures Procedures    Medications Ordered in ED Medications  0.9 %  sodium chloride infusion ( Intravenous New Bag/Given 07/30/22 1635)  cefTRIAXone (ROCEPHIN) 1 g in sodium chloride 0.9 % 100 mL IVPB (1 g Intravenous New Bag/Given 07/30/22 1850)  azithromycin (ZITHROMAX) 500 mg in sodium chloride 0.9 % 250 mL IVPB (has no administration in time range)  oxyCODONE-acetaminophen (PERCOCET/ROXICET) 5-325 MG per tablet 1 tablet (1 tablet Oral Given 07/30/22 1525)  sodium chloride 0.9 % bolus 500 mL (0 mLs Intravenous Stopped 07/30/22 1819)    ED Course/ Medical Decision Making/ A&P                           Medical Decision Making Amount and/or Complexity of Data Reviewed Labs: ordered. Radiology: ordered.  Risk Prescription drug management. Decision regarding hospitalization.   Patient's oxygen sats here been pretty consistent 95%.  No significant wheezing.  Patient's husband was stating that he did not think that  he would be take care of her at home anymore.  Work-up though however does raise concerns for right-sided pneumonia and also probably worsening of her small cell CA.  But patient's recent visit with hematology oncology on August 8 raise some can questions about whether she wanted to reestablish treatment or not.  CT chest shows worsening of the right-sided cancer.  And also raises concerns for right-sided pneumonia.  Patient CT head CT cervical spine without any new findings.  Patient's oxygen sats as stated have been reasonable.  Patient was started on Rocephin and IV Zithromax.  Discussed with hospitalist patient is followed by [facility] and they will admit.   Final Clinical Impression(s) / ED Diagnoses Final diagnoses:  Small cell lung cancer ([place])  Community acquired pneumonia of right lung, unspecified part of lung  History of cervical fracture    Rx / DC Orders ED Discharge Orders     None         Fredia Sorrow, MD 07/30/22 [id number]

## 2022-07-31 ENCOUNTER — Encounter (HOSPITAL_COMMUNITY): Payer: Self-pay | Admitting: Internal Medicine

## 2022-07-31 ENCOUNTER — Observation Stay (HOSPITAL_COMMUNITY): Payer: Medicare Other

## 2022-07-31 DIAGNOSIS — C3401 Malignant neoplasm of right main bronchus: Secondary | ICD-10-CM | POA: Diagnosis present

## 2022-07-31 DIAGNOSIS — Z7401 Bed confinement status: Secondary | ICD-10-CM | POA: Diagnosis not present

## 2022-07-31 DIAGNOSIS — B370 Candidal stomatitis: Secondary | ICD-10-CM | POA: Diagnosis present

## 2022-07-31 DIAGNOSIS — Z91199 Patient's noncompliance with other medical treatment and regimen due to unspecified reason: Secondary | ICD-10-CM | POA: Diagnosis not present

## 2022-07-31 DIAGNOSIS — R531 Weakness: Secondary | ICD-10-CM | POA: Diagnosis not present

## 2022-07-31 DIAGNOSIS — W19XXXA Unspecified fall, initial encounter: Secondary | ICD-10-CM | POA: Diagnosis present

## 2022-07-31 DIAGNOSIS — E669 Obesity, unspecified: Secondary | ICD-10-CM | POA: Diagnosis present

## 2022-07-31 DIAGNOSIS — Z8781 Personal history of (healed) traumatic fracture: Secondary | ICD-10-CM | POA: Diagnosis not present

## 2022-07-31 DIAGNOSIS — Z79890 Hormone replacement therapy (postmenopausal): Secondary | ICD-10-CM | POA: Diagnosis not present

## 2022-07-31 DIAGNOSIS — I272 Pulmonary hypertension, unspecified: Secondary | ICD-10-CM

## 2022-07-31 DIAGNOSIS — Z9221 Personal history of antineoplastic chemotherapy: Secondary | ICD-10-CM | POA: Diagnosis not present

## 2022-07-31 DIAGNOSIS — E039 Hypothyroidism, unspecified: Secondary | ICD-10-CM | POA: Diagnosis present

## 2022-07-31 DIAGNOSIS — R946 Abnormal results of thyroid function studies: Secondary | ICD-10-CM | POA: Diagnosis present

## 2022-07-31 DIAGNOSIS — Z800 Family history of malignant neoplasm of digestive organs: Secondary | ICD-10-CM | POA: Diagnosis not present

## 2022-07-31 DIAGNOSIS — Z7951 Long term (current) use of inhaled steroids: Secondary | ICD-10-CM | POA: Diagnosis not present

## 2022-07-31 DIAGNOSIS — E876 Hypokalemia: Secondary | ICD-10-CM | POA: Diagnosis present

## 2022-07-31 DIAGNOSIS — Z79899 Other long term (current) drug therapy: Secondary | ICD-10-CM | POA: Diagnosis not present

## 2022-07-31 DIAGNOSIS — Z6828 Body mass index (BMI) 28.0-28.9, adult: Secondary | ICD-10-CM | POA: Diagnosis not present

## 2022-07-31 DIAGNOSIS — Z8249 Family history of ischemic heart disease and other diseases of the circulatory system: Secondary | ICD-10-CM | POA: Diagnosis not present

## 2022-07-31 DIAGNOSIS — G7080 Lambert-Eaton syndrome, unspecified: Secondary | ICD-10-CM | POA: Diagnosis present

## 2022-07-31 DIAGNOSIS — F17290 Nicotine dependence, other tobacco product, uncomplicated: Secondary | ICD-10-CM | POA: Diagnosis present

## 2022-07-31 DIAGNOSIS — C3490 Malignant neoplasm of unspecified part of unspecified bronchus or lung: Secondary | ICD-10-CM | POA: Diagnosis not present

## 2022-07-31 DIAGNOSIS — J432 Centrilobular emphysema: Secondary | ICD-10-CM | POA: Diagnosis not present

## 2022-07-31 DIAGNOSIS — J449 Chronic obstructive pulmonary disease, unspecified: Secondary | ICD-10-CM | POA: Diagnosis present

## 2022-07-31 DIAGNOSIS — Z8041 Family history of malignant neoplasm of ovary: Secondary | ICD-10-CM | POA: Diagnosis not present

## 2022-07-31 DIAGNOSIS — F1120 Opioid dependence, uncomplicated: Secondary | ICD-10-CM | POA: Diagnosis present

## 2022-07-31 DIAGNOSIS — S12110A Anterior displaced Type II dens fracture, initial encounter for closed fracture: Secondary | ICD-10-CM | POA: Diagnosis present

## 2022-07-31 DIAGNOSIS — J441 Chronic obstructive pulmonary disease with (acute) exacerbation: Secondary | ICD-10-CM | POA: Diagnosis present

## 2022-07-31 DIAGNOSIS — Z87442 Personal history of urinary calculi: Secondary | ICD-10-CM | POA: Diagnosis not present

## 2022-07-31 LAB — BLOOD GAS, VENOUS
Acid-Base Excess: 0.8 mmol/L (ref 0.0–2.0)
Bicarbonate: 26 mmol/L (ref 20.0–28.0)
O2 Saturation: 51.1 %
Patient temperature: 36.7
pCO2, Ven: 42 mmHg — ABNORMAL LOW (ref 44–60)
pH, Ven: 7.39 (ref 7.25–7.43)
pO2, Ven: <31 mmHg — CL (ref 32–45)

## 2022-07-31 LAB — CBC
HCT: 35 % — ABNORMAL LOW (ref 36.0–46.0)
Hemoglobin: 11.7 g/dL — ABNORMAL LOW (ref 12.0–15.0)
MCH: 31.5 pg (ref 26.0–34.0)
MCHC: 33.4 g/dL (ref 30.0–36.0)
MCV: 94.3 fL (ref 80.0–100.0)
Platelets: 375 10*3/uL (ref 150–400)
RBC: 3.71 MIL/uL — ABNORMAL LOW (ref 3.87–5.11)
RDW: 13.4 % (ref 11.5–15.5)
WBC: 10 10*3/uL (ref 4.0–10.5)
nRBC: 0 % (ref 0.0–0.2)

## 2022-07-31 LAB — BASIC METABOLIC PANEL
Anion gap: 8 (ref 5–15)
BUN: 17 mg/dL (ref 8–23)
CO2: 23 mmol/L (ref 22–32)
Calcium: 8.2 mg/dL — ABNORMAL LOW (ref 8.9–10.3)
Chloride: 106 mmol/L (ref 98–111)
Creatinine, Ser: 1.12 mg/dL — ABNORMAL HIGH (ref 0.44–1.00)
GFR, Estimated: 53 mL/min — ABNORMAL LOW (ref >60)
Glucose, Bld: 118 mg/dL — ABNORMAL HIGH (ref 70–99)
Potassium: 3.3 mmol/L — ABNORMAL LOW (ref 3.5–5.1)
Sodium: 137 mmol/L (ref 135–145)

## 2022-07-31 LAB — ECHOCARDIOGRAM COMPLETE
Area-P 1/2: 4.15 cm2
S' Lateral: 3 cm

## 2022-07-31 MED ORDER — POTASSIUM CHLORIDE CRYS ER 20 MEQ PO TBCR
40.0000 meq | EXTENDED_RELEASE_TABLET | Freq: Once | ORAL | Status: AC
  Administered 2022-07-31: 40 meq via ORAL
  Filled 2022-07-31: qty 2

## 2022-07-31 MED ORDER — HYDROCODONE-ACETAMINOPHEN 5-325 MG PO TABS
1.0000 | ORAL_TABLET | Freq: Once | ORAL | Status: AC
  Administered 2022-07-31: 1 via ORAL
  Filled 2022-07-31: qty 1

## 2022-07-31 MED ORDER — LEVOTHYROXINE SODIUM 25 MCG PO TABS
125.0000 ug | ORAL_TABLET | Freq: Every day | ORAL | Status: DC
  Administered 2022-08-01: 125 ug via ORAL
  Filled 2022-07-31: qty 1

## 2022-07-31 NOTE — Progress Notes (Signed)
PROGRESS NOTE    Tammy Bowers  KGM:[id number] DOB: Sep 05, 1951 DOA: 07/30/2022 PCP: Mckinley Jewel, MD    Brief Narrative:  Tammy Bowers is a 71 y.o. female with medical history significant of right-sided small cell lung cancer off chemo and immunotherapy, COPD, chronic ambulation dysfunction on roller walker, chronic C1 dens fracture noncompliant with neck collar, sequestrum, narcotic dependence, presented with worsening of cough wheezing and shortness of breath and generalized weakness.     Assessment and Plan: Acute COPD exacerbation -Likely related to ongoing smoking -p.o. steroid, resume LABA and inhaled steroid, continue DuoNebs and as needed albuterol. - azithromycin. -weaned to RA   Generalized weakness -Unclear etiology, rule out Lambert-Eaton: antiacetylcholine antibody titer. -PT evaluation   Chronic neck fracture -wearing collar  Right lung small cell carcinoma - new finding on CT chest -per Dr. Roosevelt Locks, Dr. Lorenso Courier to see.   Hypokalemia -replete  Oral thrush -Improving, continue nystatin   Cigar smoker -patient has no plan to quit smoking   Hypothyroidism -TSH elevated-- unclear if patient taking - will increase dose slightly   DVT prophylaxis: enoxaparin (LOVENOX) injection 40 mg Start: 07/31/22 1000    Code Status: Full Code Family Communication:   Disposition Plan:  Level of care: Med-Surg Status is: Observation The patient will require care spanning > 2 midnights and should be moved to inpatient because: needs oncology eval/PT Eval    Consultants:  oncology   Subjective: No current complaints  Objective: Vitals:   07/31/22 0011 07/31/22 0421 07/31/22 0808 07/31/22 0832  BP: 131/83 (!) 140/89 (!) 146/96   Pulse: 94 83 83   Resp: 16 16 17    Temp: 97.9 F (36.6 C) 97.9 F (36.6 C) 97.8 F (36.6 C)   TempSrc: Oral Oral    SpO2: 97% 94% 100% 97%    Intake/Output Summary (Last 24 hours) at 07/31/2022 1221 Last data filed at 07/31/2022  0800 Gross per 24 hour  Intake 220 ml  Output 700 ml  Net -480 ml   There were no vitals filed for this visit.  Examination:   General: Appearance:     Overweight female in no acute distress, wearing neck collar     Lungs:     Diminished, no wheezing, respirations unlabored  Heart:    Normal heart rate. Normal rhythm. No murmurs, rubs, or gallops.    MS:   All extremities are intact.    Neurologic:   Awake, alert       Data Reviewed: I have personally reviewed following labs and imaging studies  CBC: Recent Labs  Lab 07/26/22 1404 07/30/22 1535 07/30/22 1904 07/31/22 0415  WBC 8.2 8.5  --  10.0  NEUTROABS 6.7 7.5  --   --   HGB 13.4 14.2 17.0* 11.7*  HCT 38.8 41.6 50.0* 35.0*  MCV 93.5 94.1  --  94.3  PLT 348 407*  --  644   Basic Metabolic Panel: Recent Labs  Lab 07/26/22 1404 07/30/22 1535 07/30/22 1904 07/31/22 0415  NA 137 135 134* 137  K 3.8 3.7 3.8 3.3*  CL 105 105  --  106  CO2 25 20*  --  23  GLUCOSE 97 91  --  118*  BUN 18 20  --  17  CREATININE 1.20* 1.21*  --  1.12*  CALCIUM 9.0 8.9  --  8.2*  MG  --  1.7  --   --    GFR: Estimated Creatinine Clearance: 50.4 mL/min (A) (by C-G formula based on SCr  of 1.12 mg/dL (H)). Liver Function Tests: Recent Labs  Lab 07/26/22 1404 07/30/22 1535  AST 20 21  ALT 13 17  ALKPHOS 139* 153*  BILITOT 0.4 0.7  PROT 7.1 6.7  ALBUMIN 3.4* 2.7*   Recent Labs  Lab 07/30/22 1535  LIPASE 30   No results for input(s): "AMMONIA" in the last 168 hours. Coagulation Profile: No results for input(s): "INR", "PROTIME" in the last 168 hours. Cardiac Enzymes: No results for input(s): "CKTOTAL", "CKMB", "CKMBINDEX", "TROPONINI" in the last 168 hours. BNP (last 3 results) No results for input(s): "PROBNP" in the last 8760 hours. HbA1C: No results for input(s): "HGBA1C" in the last 72 hours. CBG: No results for input(s): "GLUCAP" in the last 168 hours. Lipid Profile: No results for input(s): "CHOL", "HDL",  "LDLCALC", "TRIG", "CHOLHDL", "LDLDIRECT" in the last 72 hours. Thyroid Function Tests: Recent Labs    07/30/22 2230  TSH 30.590*   Anemia Panel: No results for input(s): "VITAMINB12", "FOLATE", "FERRITIN", "TIBC", "IRON", "RETICCTPCT" in the last 72 hours. Sepsis Labs: Recent Labs  Lab 07/30/22 2230  LATICACIDVEN 0.9    No results found for this or any previous visit (from the past 240 hour(s)).       Radiology Studies: CT Chest Wo Contrast  Result Date: 07/30/2022 CLINICAL DATA:  Pneumonia. EXAM: CT CHEST WITHOUT CONTRAST TECHNIQUE: Multidetector CT imaging of the chest was performed following the standard protocol without IV contrast. RADIATION DOSE REDUCTION: This exam was performed according to the departmental dose-optimization program which includes automated exposure control, adjustment of the mA and/or kV according to patient size and/or use of iterative reconstruction technique. COMPARISON:  Chest x-ray same day. CT chest abdomen and pelvis 12/28/2021. FINDINGS: Cardiovascular: Heart and aorta are normal in size. There is no pericardial effusion. There are atherosclerotic calcifications of the aorta and coronary arteries. Right chest port catheter tip ends in the distal SVC. Again seen is narrowing of the superior vena cava as it approximates the right atrium, unchanged. Mediastinum/Nodes: Enlargement of right hilar and perihilar mass identified in the interval. Discrete margins are difficult to measure secondary to lack of contrast but are proximally 4.6 x 4.9 by 4.9 cm. This extends into the subcarinal space. Calcified subcarinal and right hilar lymph nodes are again seen. The esophagus is nondilated. The thyroid gland is not well seen. No other separate discrete enlarged lymph nodes are identified. Lungs/Pleura: There is new cutoff of the distal right mainstem bronchus. There is subsequent collapse of the right middle lobe and right lower lobe. There are scattered tree-in-bud  opacities throughout the mid and inferior right lung. There is a new 8 mm nodule in the inferior right lung image 2/81. There is a new nodular density measuring 1 cm in the right apex image 4/45. New bilobed nodule with larger component measuring 4 mm in the left upper lobe image 4/15. There is new volume loss on the right with a new small right pleural effusion. Upper Abdomen: Right adrenal nodule measuring up to 3.6 cm appears unchanged. There are punctate left renal calculi without hydronephrosis. Musculoskeletal: No chest wall mass or suspicious bone lesions identified. IMPRESSION: 1. Progression/enlargement of right hilar mass worrisome for disease recurrence/neoplasm. There is new cutoff of the distal right mainstem bronchus with collapse of the right middle lobe and right lower lobe. 2. New small right pleural effusion. 3. Scattered tree-in-bud opacities throughout the right lung may be infectious/inflammatory or neoplastic. 4. There are few new scattered nodular densities in the right lung and  left lung apex. Metastatic disease not excluded. 5. Unchanged right adrenal nodule. Electronically Signed   By: Ronney Asters M.D.   On: 07/30/2022 18:29   CT Head Wo Contrast  Result Date: 07/30/2022 CLINICAL DATA:  History of cervical spine fracture common noncompliant EXAM: CT HEAD WITHOUT CONTRAST CT CERVICAL SPINE WITHOUT CONTRAST TECHNIQUE: Multidetector CT imaging of the head and cervical spine was performed following the standard protocol without intravenous contrast. Multiplanar CT image reconstructions of the cervical spine were also generated. RADIATION DOSE REDUCTION: This exam was performed according to the departmental dose-optimization program which includes automated exposure control, adjustment of the mA and/or kV according to patient size and/or use of iterative reconstruction technique. COMPARISON:  CT August 4 ,2023. FINDINGS: CT HEAD FINDINGS Brain: Similar appearance of the extensive  subcortical and periventricular white matter hypodensities. No evidence of acute infarction, hemorrhage, hydrocephalus, extra-axial collection or mass lesion/mass effect. Vascular: No hyperdense vessel. Atherosclerotic calcifications of the internal and vertebral arteries at the skull base. Skull: Normal. Negative for fracture or focal lesion. Sinuses/Orbits: Visualized portions of the paranasal sinuses are predominantly clear. Orbits are grossly unremarkable. Other: Mastoid air cells are predominantly clear. CT CERVICAL SPINE FINDINGS Alignment: Slight straightening of the normal cervical lordosis. Mild C1 on C2 retrolisthesis is unchanged from prior. Skull base and vertebrae: Slightly comminuted type 2 dens fracture with 4 mm of retrolisthesis is unchanged from prior. Similar appearance of the bilateral nondisplaced C1 posterior arch fractures. Soft tissues and spinal canal: Mild prevertebral swelling at C1-C2 is unchanged from prior. No visible canal hematoma. Disc levels: Multilevel degenerative changes spine unchanged from prior. Upper chest: Biapical pleuroparenchymal scarring. Other: None IMPRESSION: 1. Similar appearance of the type 2 dens fracture with 4 mm of retrolisthesis. 2. No significant interval change in the bilateral nondisplaced fractures of the C1 posterior arch. 3. No acute intracranial abnormality. 4. Similar extensive periventricular and subcortical white matter hypodensities which may be associated with known radiation. Electronically Signed   By: Dahlia Bailiff M.D.   On: 07/30/2022 16:47   CT Cervical Spine Wo Contrast  Result Date: 07/30/2022 CLINICAL DATA:  History of cervical spine fracture common noncompliant EXAM: CT HEAD WITHOUT CONTRAST CT CERVICAL SPINE WITHOUT CONTRAST TECHNIQUE: Multidetector CT imaging of the head and cervical spine was performed following the standard protocol without intravenous contrast. Multiplanar CT image reconstructions of the cervical spine were also  generated. RADIATION DOSE REDUCTION: This exam was performed according to the departmental dose-optimization program which includes automated exposure control, adjustment of the mA and/or kV according to patient size and/or use of iterative reconstruction technique. COMPARISON:  CT August 4 ,2023. FINDINGS: CT HEAD FINDINGS Brain: Similar appearance of the extensive subcortical and periventricular white matter hypodensities. No evidence of acute infarction, hemorrhage, hydrocephalus, extra-axial collection or mass lesion/mass effect. Vascular: No hyperdense vessel. Atherosclerotic calcifications of the internal and vertebral arteries at the skull base. Skull: Normal. Negative for fracture or focal lesion. Sinuses/Orbits: Visualized portions of the paranasal sinuses are predominantly clear. Orbits are grossly unremarkable. Other: Mastoid air cells are predominantly clear. CT CERVICAL SPINE FINDINGS Alignment: Slight straightening of the normal cervical lordosis. Mild C1 on C2 retrolisthesis is unchanged from prior. Skull base and vertebrae: Slightly comminuted type 2 dens fracture with 4 mm of retrolisthesis is unchanged from prior. Similar appearance of the bilateral nondisplaced C1 posterior arch fractures. Soft tissues and spinal canal: Mild prevertebral swelling at C1-C2 is unchanged from prior. No visible canal hematoma. Disc levels: Multilevel degenerative changes spine  unchanged from prior. Upper chest: Biapical pleuroparenchymal scarring. Other: None IMPRESSION: 1. Similar appearance of the type 2 dens fracture with 4 mm of retrolisthesis. 2. No significant interval change in the bilateral nondisplaced fractures of the C1 posterior arch. 3. No acute intracranial abnormality. 4. Similar extensive periventricular and subcortical white matter hypodensities which may be associated with known radiation. Electronically Signed   By: Dahlia Bailiff M.D.   On: 07/30/2022 16:47   DG Chest Port 1 View  Result Date:  07/30/2022 CLINICAL DATA:  Altered mental status. Increased lethargy and weakness. EXAM: PORTABLE CHEST 1 VIEW COMPARISON:  CT December 28, 2021. FINDINGS: Patient is rotated. Right chest Port-A-Cath with tip at the superior cavoatrial junction. Displacement of the mediastinum into the right hemithorax is similar to prior and at least somewhat accentuated by technique. Aortic atherosclerosis. Posttreatment change in the right lung. Small right pleural effusion with adjacent atelectasis/infiltrate. No acute osseous abnormality. IMPRESSION: Small right pleural effusion with adjacent atelectasis/infiltrate. Posttreatment change in the right lung. Left lung is clear. Electronically Signed   By: Dahlia Bailiff M.D.   On: 07/30/2022 16:24        Scheduled Meds:  azithromycin  500 mg Oral Daily   Chlorhexidine Gluconate Cloth  6 each Topical Daily   enoxaparin (LOVENOX) injection  40 mg Subcutaneous Q24H   fluticasone furoate-vilanterol  1 puff Inhalation Daily   levothyroxine  112 mcg Oral QAC breakfast   nystatin  5 mL Oral QID   predniSONE  40 mg Oral Q breakfast   topiramate  50 mg Oral BID   Continuous Infusions:  sodium chloride 100 mL/hr at 07/30/22 1635   azithromycin       LOS: 0 days    Time spent: 45 minutes spent on chart review, discussion with nursing staff, consultants, updating family and interview/physical exam; more than 50% of that time was spent in counseling and/or coordination of care.    Geradine Girt, DO [facility] Available via Epic secure chat 7am-7pm After these hours, please refer to coverage provider listed on [email or web address] 07/31/2022, 12:21 PM

## 2022-07-31 NOTE — Progress Notes (Signed)
  Echocardiogram 2D Echocardiogram has been performed.  Tammy Bowers 07/31/2022, 3:41 PM

## 2022-07-31 NOTE — Progress Notes (Signed)
Date and time results received: 07/31/22 1256 (use smartphrase ".now" to insert current time)  Test: PO2  Critical Value: PO2 <31  Name of Provider Notified: Dr. Eliseo Squires  Orders Received? Or Actions Taken?:  Lab called 07/31/22 at 1256 to report a critical PO2 <32 from 07/30/22 at 2230.  Purvis Kilts (from lab) stated it was a missed critical value so she was calling to report it at this time. Dr. Eliseo Squires notified and no new orders given.

## 2022-07-31 NOTE — Progress Notes (Signed)
Patient found to be non-compliant with c-spine precautions. [place]-J collar found at bottom of bed when checking on patient. Nasal cannula also removed by patient. Collar and [place] replaced. Patient educated on precautions. Will continue to monitor.

## 2022-07-31 NOTE — Evaluation (Addendum)
Physical Therapy Evaluation Patient Details Name: Tammy Bowers MRN: [id number] DOB: 14-Sep-1951 Today's Date: 07/31/2022  History of Present Illness  Tammy Bowers is a 71 y.o. female with medical history significant of right-sided small cell lung cancer off chemo and immunotherapy, COPD, chronic ambulation dysfunction on roller walker, chronic C1 dens fracture noncompliant with neck collar, sequestrum, narcotic dependence, presented with worsening of cough wheezing and shortness of breath and generalized weakness.  Clinical Impression  Pt agreeable to physical therapy evaluation/treatment session after encouragement provided. Pt with confusion and is a poor historian. Pt performed step pivot transfer from bed to chair with min guard assist. Pt provided minimal effort during session. Pt currently presents with functional limitations secondary to impairments listed in PT problem list. Pt to benefit from skilled, acute care physical therapy interventions to maximize her strength, mobility, independence level, and quality of life. Will progress with gait training and chair follow as well as establishment of HEP.     Recommendations for follow up therapy are one component of a multi-disciplinary discharge planning process, led by the attending physician.  Recommendations may be updated based on patient status, additional functional criteria and insurance authorization.  Follow Up Recommendations Home health PT (pending pt's progress and spouse able to assist 24/7)      Assistance Recommended at Discharge Frequent or constant Supervision/Assistance  Patient can return home with the following  A little help with walking and/or transfers;A little help with bathing/dressing/bathroom;Assistance with cooking/housework;Assist for transportation;Help with stairs or ramp for entrance    Equipment Recommendations Other (comment) (TBD)  Recommendations for Other Services       Functional Status Assessment  Patient has had a recent decline in their functional status and demonstrates the ability to make significant improvements in function in a reasonable and predictable amount of time.     Precautions / Restrictions Precautions Precautions: Fall;Cervical Precaution Booklet Issued: No Required Braces or Orthoses: Cervical Brace Cervical Brace: Hard collar;At all times Restrictions Weight Bearing Restrictions: No      Mobility  Bed Mobility Overal bed mobility: Needs Assistance Bed Mobility: Supine to Sit, Rolling Rolling: Min assist   Supine to sit: Min assist, Mod assist     General bed mobility comments: Pt rolled to left side with bedrail for supine to sit transfer. Pt required cues to maintain cervical precautions. Two attempts required for supine to sit with encouragement due to pt's limited effort. Mostly min assist required.    Transfers Overall transfer level: Needs assistance Equipment used: Rolling walker (2 wheels) Transfers: Sit to/from Stand, Bed to chair/wheelchair/BSC Sit to Stand: Min assist, +2 physical assistance   Step pivot transfers: Min guard, +2 safety/equipment       General transfer comment: Pt pulling back on RW during sit to stand with unsteadiness and retropulsion present. Pt required cues and physical assistance to improve upon.    Ambulation/Gait Ambulation/Gait assistance: Min guard Gait Distance (Feet): 3 Feet Assistive device: Rolling walker (2 wheels) Gait Pattern/deviations: Decreased step length - right, Decreased step length - left, Trunk flexed Gait velocity: dec Gait velocity interpretation: <1.31 ft/sec, indicative of household ambulator   General Gait Details: Pt ambulated from bed to chair with short, shuffling steps. Pt required cues for sequencing and safety awareness. No LOB occurred. Limited effort from pt.  Stairs            Wheelchair Mobility    Modified Rankin (Stroke Patients Only)       Balance Overall  balance assessment: Needs  assistance Sitting-balance support: Single extremity supported Sitting balance-Leahy Scale: Fair     Standing balance support: Bilateral upper extremity supported, Reliant on assistive device for balance Standing balance-Leahy Scale: Poor                               Pertinent Vitals/Pain Pain Assessment Pain Assessment: 0-10 Pain Score: 10-Worst pain ever Pain Location: cervical spine Pain Descriptors / Indicators:  (pt not sure) Pain Intervention(s): Limited activity within patient's tolerance, Monitored during session, RN gave pain meds during session    [place] expects to be discharged to:: Private residence Living Arrangements: Spouse/significant other Available Help at Discharge: Available 24 hours/day;Family Type of Home: Apartment Home Access: Stairs to enter Entrance Stairs-Rails: Right Entrance Stairs-Number of Steps: 4 Alternate Level Stairs-Number of Steps: flight Home Layout: Multi-level;Full bath on main level;Able to live on main level with bedroom/bathroom Home Equipment: Rollator (4 wheels);Wheelchair - manual;Shower seat;BSC/3in1 Additional Comments: Pt poor historian. Equipment, PLOF, etc needs to be reviewed with her spouse if present.    Prior Function Prior Level of Function : Needs assist;History of Falls (last six months) (Pt does not drive)             Mobility Comments: Requires assistance for transfers to and from shower, bed, and toilet. Pt uses WC and walker for mobility. ADLs Comments: Pt requires assist for managing medications. Pt independent with bathing and dressing. Pt's spouse takes care of her errands.     Hand Dominance        Extremity/Trunk Assessment   Upper Extremity Assessment Upper Extremity Assessment: Generalized weakness    Lower Extremity Assessment Lower Extremity Assessment: Generalized weakness (Pt able to perform SLR on each side. At least 3+/5 grossly B  LE's). Grossly WNL knee flexion and ankle ROM       Communication   Communication: No difficulties  Cognition Arousal/Alertness: Awake/alert Behavior During Therapy: WFL for tasks assessed/performed Overall Cognitive Status: No family/caregiver present to determine baseline cognitive functioning                                 General Comments: Pt with slow processing and confusion. Pt not oriented to time or place (thought it was 2024).        General Comments General comments (skin integrity, edema, etc.): HR and SpO2 WNL on RA    Exercises     Assessment/Plan    PT Assessment Patient needs continued PT services  PT Problem List Decreased strength;Decreased activity tolerance;Decreased balance;Decreased mobility;Decreased cognition;Decreased knowledge of use of DME;Pain;Obesity       PT Treatment Interventions DME instruction;Gait training;Stair training;Functional mobility training;Therapeutic activities;Therapeutic exercise;Balance training;Neuromuscular re-education;Cognitive remediation;Patient/family education    PT Goals (Current goals can be found in the Care Plan section)  Acute Rehab PT Goals Patient Stated Goal: to go home PT Goal Formulation: With patient Time For Goal Achievement: 08/07/22 Potential to Achieve Goals: Fair    Frequency Min 3X/week     Co-evaluation               AM-PAC PT "6 Clicks" Mobility  Outcome Measure Help needed turning from your back to your side while in a flat bed without using bedrails?: A Little Help needed moving from lying on your back to sitting on the side of a flat bed without using bedrails?: A Little Help needed moving to  and from a bed to a chair (including a wheelchair)?: A Little Help needed standing up from a chair using your arms (e.g., wheelchair or bedside chair)?: A Lot Help needed to walk in hospital room?: A Lot Help needed climbing 3-5 steps with a railing? : Total 6 Click Score: 14     End of Session Equipment Utilized During Treatment: Gait belt Activity Tolerance: Patient limited by pain;Other (comment) (also limited by decreased pt effort) Patient left: in chair;with call bell/phone within reach;with chair alarm set Nurse Communication: Mobility status PT Visit Diagnosis: Other abnormalities of gait and mobility (R26.89);Muscle weakness (generalized) (M62.81);Pain;Adult, failure to thrive (R62.7) Pain - part of body:  (neck)    Time: 9417-4081 PT Time Calculation (min) (ACUTE ONLY): 32 min   Charges:   PT Evaluation $PT Eval Low Complexity: 1 Low PT Treatments $Therapeutic Activity: 8-22 mins        Donna Bernard, PT   [facility] 07/31/2022, 12:41 PM

## 2022-08-01 DIAGNOSIS — Z8781 Personal history of (healed) traumatic fracture: Secondary | ICD-10-CM

## 2022-08-01 DIAGNOSIS — C3490 Malignant neoplasm of unspecified part of unspecified bronchus or lung: Secondary | ICD-10-CM

## 2022-08-01 DIAGNOSIS — J432 Centrilobular emphysema: Secondary | ICD-10-CM | POA: Diagnosis not present

## 2022-08-01 LAB — BASIC METABOLIC PANEL
Anion gap: 7 (ref 5–15)
BUN: 12 mg/dL (ref 8–23)
CO2: 23 mmol/L (ref 22–32)
Calcium: 8.2 mg/dL — ABNORMAL LOW (ref 8.9–10.3)
Chloride: 108 mmol/L (ref 98–111)
Creatinine, Ser: 1 mg/dL (ref 0.44–1.00)
GFR, Estimated: >60 mL/min (ref >60)
Glucose, Bld: 95 mg/dL (ref 70–99)
Potassium: 3.4 mmol/L — ABNORMAL LOW (ref 3.5–5.1)
Sodium: 138 mmol/L (ref 135–145)

## 2022-08-01 LAB — ACETYLCHOLINE RECEPTOR, BINDING: Acety choline binding ab: <0.03 nmol/L (ref 0.00–0.24)

## 2022-08-01 MED ORDER — PREDNISONE 20 MG PO TABS: 40.0000 mg | ORAL_TABLET | Freq: Every day | ORAL | 0 refills | Status: AC

## 2022-08-01 MED ORDER — HEPARIN SOD (PORK) LOCK FLUSH 100 UNIT/ML IV SOLN
500.0000 [IU] | INTRAVENOUS | Status: AC | PRN
  Administered 2022-08-01: 500 [IU]

## 2022-08-01 MED ORDER — POTASSIUM CHLORIDE CRYS ER 20 MEQ PO TBCR
40.0000 meq | EXTENDED_RELEASE_TABLET | Freq: Once | ORAL | Status: AC
  Administered 2022-08-01: 40 meq via ORAL
  Filled 2022-08-01: qty 2

## 2022-08-01 MED ORDER — AZITHROMYCIN 250 MG PO TABS: ORAL_TABLET | ORAL | 0 refills | Status: DC

## 2022-08-01 MED ORDER — LEVOTHYROXINE SODIUM 125 MCG PO TABS: 125.0000 ug | ORAL_TABLET | Freq: Every day | ORAL | 0 refills | Status: DC

## 2022-08-01 MED ORDER — SODIUM CHLORIDE 0.9% FLUSH: 10.0000 mL | Freq: Two times a day (BID) | INTRAVENOUS | Status: DC

## 2022-08-01 NOTE — Progress Notes (Signed)
SATURATION QUALIFICATIONS: (This note is used to comply with regulatory documentation for home oxygen)  Patient Saturations on Room Air at Rest = 93%  Patient Saturations on Room Air while Ambulating = 93%  Patient Saturations on N/A Liters of oxygen while Ambulating = N/A%  Please briefly explain why patient needs home oxygen:  [place] or Office Phone: [phone]

## 2022-08-01 NOTE — Progress Notes (Signed)
Pt awaiting transport home via [facility]. Pt declined review of discharge instructions at this time. I told pt I will come back within the hour to review discharge instructions as time allows. Pt agreeable to plan.

## 2022-08-01 NOTE — Discharge Summary (Signed)
Physician Discharge Summary  Tammy Bowers CXK:[id number] DOB: October 26, 1951 DOA: 07/30/2022  PCP: Mckinley Jewel, MD  Admit date: 07/30/2022 Discharge date: 08/01/2022  Admitted From: home Discharge disposition: home   Recommendations for Outpatient Follow-Up:   Close follow up with oncology-- consideration to palliative care/hospice Smoking cessation Home health TSH dose adjusted- recheck TSH 6 weeks Acetylcholine receptor, binding pending from admission    Discharge Diagnosis:   Principal Problem:   COPD (chronic obstructive pulmonary disease) ([place])    Discharge Condition: Improved.  Diet recommendation: Low sodium, heart healthy.  Wound care: None.  Code status: Full.   History of Present Illness:   Tammy Bowers is a 71 y.o. female with medical history significant of right-sided small cell lung cancer off chemo and immunotherapy, COPD, chronic ambulation dysfunction on roller walker, chronic C1 dens fracture noncompliant with neck collar, sequestrum, narcotic dependence, presented with worsening of cough wheezing and shortness of breath and generalized weakness.   Symptoms started 2 weeks ago and gradually getting worse, husband reported patient has had decreased exercise tolerance, walking inside the house because significant shortness of breath and had to stop frequently to catch breath.  Wheezing, and run out of the elevator only uses as needed albuterol at home.  No fever chills no chest pains.  This week, patient has been feeling extremely weak, no fall.  Husband also report significant decrease of appetite and oral thrush.  She went to see oncology on Monday, who diagnosed patient with oral thrush and started her on nystatin p.o. every 6 hours.  And patient lost her teeth and has been on liquid diet but reported no swallowing problems or odynophagia.  Denies any abdominal pain or diarrhea.   Hospital Course by Problem:   Acute COPD exacerbation -Likely  related to ongoing smoking -p.o. steroid, resume LABA and inhaled steroid, continue DuoNebs and as needed albuterol. - azithromycin. -weaned to RA   Generalized weakness -Unclear etiology, rule out Lambert-Eaton: antiacetylcholine antibody titer. -PT evaluation- home health   Chronic neck fracture -wearing collar   Right lung small cell carcinoma - new finding on CT chest -appears Dr. Lorenso Courier will see patient outpatient to discuss results for CT scan   Hypokalemia -replete   Oral thrush -Improving, continue nystatin   Cigar smoker -patient has no plan to quit smoking   Hypothyroidism -TSH elevated-- unclear if patient taking - will increase dose slightly  Poor overall prognosis  Medical Consultants:      Discharge Exam:   Vitals:   08/01/22 0735 08/01/22 0900  BP: (!) 140/106   Pulse: 86   Resp: 18   Temp: 98.3 F (36.8 C)   SpO2: 96% 96%   Vitals:   07/31/22 2053 08/01/22 0521 08/01/22 0735 08/01/22 0900  BP:  (!) 128/93 (!) 140/106   Pulse:  84 86   Resp:  16 18   Temp:  98.5 F (36.9 C) 98.3 F (36.8 C)   TempSrc:  Oral Oral   SpO2:  97% 96% 96%  Weight: 79.5 kg     Height: 5\' 6"  (1.676 m)       General exam: Appears calm and comfortable. Chronically ill appearing   The results of significant diagnostics from this hospitalization (including imaging, microbiology, ancillary and laboratory) are listed below for reference.     Procedures and Diagnostic Studies:   ECHOCARDIOGRAM COMPLETE  Result Date: 07/31/2022    ECHOCARDIOGRAM REPORT   Patient Name:   Tammy Bowers Date of  Exam: 07/31/2022 Medical Rec #:  [id number]   Height:       66.0 in Accession #:    [id number]  Weight:       175.2 lb Date of Birth:  Jun 09, 1951    BSA:          1.891 m Patient Age:    66 years    BP:           146/96 mmHg Patient Gender: F           HR:           90 bpm. Exam Location:  Inpatient Procedure: 2D Echo Indications:    pulmonary hypertension  History:        Patient  has no prior history of Echocardiogram examinations.                 COPD and Cancer, Arrythmias:Atrial Fibrillation,                 Signs/Symptoms:Shortness of Breath; Risk Factors:Current Smoker.  Sonographer:    Johny Chess RDCS Referring Phys: [id number] Lequita Halt  Sonographer Comments: Technically difficult study due to poor echo windows. Image acquisition challenging due to respiratory motion. IMPRESSIONS  1. Left ventricular ejection fraction, by estimation, is 60 to 65%. The left ventricle has normal function. Left ventricular endocardial border not optimally defined to evaluate regional wall motion. Left ventricular diastolic parameters are indeterminate.  2. Right ventricular systolic function is normal. The right ventricular size is normal. Tricuspid regurgitation signal is inadequate for assessing PA pressure.  3. A small pericardial effusion is present. The pericardial effusion is anterior to the right ventricle. There is no evidence of cardiac tamponade.  4. The mitral valve is normal in structure. No evidence of mitral valve regurgitation. No evidence of mitral stenosis.  5. The aortic valve is tricuspid. Aortic valve regurgitation is not visualized. No aortic stenosis is present.  6. Technically difficult study- suspect related to lung windows and COPD. Comparison(s): No prior Echocardiogram. FINDINGS  Left Ventricle: Left ventricular ejection fraction, by estimation, is 60 to 65%. The left ventricle has normal function. Left ventricular endocardial border not optimally defined to evaluate regional wall motion. The left ventricular internal cavity size was normal in size. There is no left ventricular hypertrophy. Left ventricular diastolic parameters are indeterminate. Right Ventricle: The right ventricular size is normal. No increase in right ventricular wall thickness. Right ventricular systolic function is normal. Tricuspid regurgitation signal is inadequate for assessing PA pressure. Left  Atrium: Left atrial size was normal in size. Right Atrium: Right atrial size was normal in size. Pericardium: A small pericardial effusion is present. The pericardial effusion is anterior to the right ventricle. There is no evidence of cardiac tamponade. Mitral Valve: The mitral valve is normal in structure. No evidence of mitral valve regurgitation. No evidence of mitral valve stenosis. Tricuspid Valve: The tricuspid valve is grossly normal. Tricuspid valve regurgitation is not demonstrated. Aortic Valve: The aortic valve is tricuspid. Aortic valve regurgitation is not visualized. No aortic stenosis is present. Pulmonic Valve: The pulmonic valve was normal in structure. Pulmonic valve regurgitation is not visualized. No evidence of pulmonic stenosis. Aorta: The aortic root, ascending aorta and aortic arch are all structurally normal, with no evidence of dilitation or obstruction. IAS/Shunts: No atrial level shunt detected by color flow Doppler.  LEFT VENTRICLE PLAX 2D LVIDd:         4.50 cm LVIDs:  3.00 cm LV PW:         0.90 cm LV IVS:        0.90 cm LVOT diam:     2.10 cm LV SV:         50 LV SV Index:   27 LVOT Area:     3.46 cm  RIGHT VENTRICLE            IVC RV S prime:     9.79 cm/s  IVC diam: 1.80 cm TAPSE (M-mode): 1.3 cm LEFT ATRIUM             Index LA diam:        3.00 cm 1.59 cm/m LA Vol (A2C):   21.3 ml 11.27 ml/m LA Vol (A4C):   22.2 ml 11.74 ml/m LA Biplane Vol: 21.4 ml 11.32 ml/m  AORTIC VALVE LVOT Vmax:   75.10 cm/s LVOT Vmean:  49.400 cm/s LVOT VTI:    0.145 m  AORTA Ao Root diam: 3.20 cm MITRAL VALVE MV Area (PHT): 4.15 cm    SHUNTS MV Decel Time: 183 msec    Systemic VTI:  0.14 m MV E velocity: 60.20 cm/s  Systemic Diam: 2.10 cm MV A velocity: 65.60 cm/s MV E/A ratio:  0.92 Rudean Haskell MD Electronically signed by Rudean Haskell MD Signature Date/Time: 07/31/2022/5:13:04 PM    Final    CT Chest Wo Contrast  Result Date: 07/30/2022 CLINICAL DATA:  Pneumonia. EXAM: CT  CHEST WITHOUT CONTRAST TECHNIQUE: Multidetector CT imaging of the chest was performed following the standard protocol without IV contrast. RADIATION DOSE REDUCTION: This exam was performed according to the departmental dose-optimization program which includes automated exposure control, adjustment of the mA and/or kV according to patient size and/or use of iterative reconstruction technique. COMPARISON:  Chest x-ray same day. CT chest abdomen and pelvis 12/28/2021. FINDINGS: Cardiovascular: Heart and aorta are normal in size. There is no pericardial effusion. There are atherosclerotic calcifications of the aorta and coronary arteries. Right chest port catheter tip ends in the distal SVC. Again seen is narrowing of the superior vena cava as it approximates the right atrium, unchanged. Mediastinum/Nodes: Enlargement of right hilar and perihilar mass identified in the interval. Discrete margins are difficult to measure secondary to lack of contrast but are proximally 4.6 x 4.9 by 4.9 cm. This extends into the subcarinal space. Calcified subcarinal and right hilar lymph nodes are again seen. The esophagus is nondilated. The thyroid gland is not well seen. No other separate discrete enlarged lymph nodes are identified. Lungs/Pleura: There is new cutoff of the distal right mainstem bronchus. There is subsequent collapse of the right middle lobe and right lower lobe. There are scattered tree-in-bud opacities throughout the mid and inferior right lung. There is a new 8 mm nodule in the inferior right lung image 2/81. There is a new nodular density measuring 1 cm in the right apex image 4/45. New bilobed nodule with larger component measuring 4 mm in the left upper lobe image 4/15. There is new volume loss on the right with a new small right pleural effusion. Upper Abdomen: Right adrenal nodule measuring up to 3.6 cm appears unchanged. There are punctate left renal calculi without hydronephrosis. Musculoskeletal: No chest  wall mass or suspicious bone lesions identified. IMPRESSION: 1. Progression/enlargement of right hilar mass worrisome for disease recurrence/neoplasm. There is new cutoff of the distal right mainstem bronchus with collapse of the right middle lobe and right lower lobe. 2. New small right pleural effusion. 3.  Scattered tree-in-bud opacities throughout the right lung may be infectious/inflammatory or neoplastic. 4. There are few new scattered nodular densities in the right lung and left lung apex. Metastatic disease not excluded. 5. Unchanged right adrenal nodule. Electronically Signed   By: Ronney Asters M.D.   On: 07/30/2022 18:29   CT Head Wo Contrast  Result Date: 07/30/2022 CLINICAL DATA:  History of cervical spine fracture common noncompliant EXAM: CT HEAD WITHOUT CONTRAST CT CERVICAL SPINE WITHOUT CONTRAST TECHNIQUE: Multidetector CT imaging of the head and cervical spine was performed following the standard protocol without intravenous contrast. Multiplanar CT image reconstructions of the cervical spine were also generated. RADIATION DOSE REDUCTION: This exam was performed according to the departmental dose-optimization program which includes automated exposure control, adjustment of the mA and/or kV according to patient size and/or use of iterative reconstruction technique. COMPARISON:  CT August 4 ,2023. FINDINGS: CT HEAD FINDINGS Brain: Similar appearance of the extensive subcortical and periventricular white matter hypodensities. No evidence of acute infarction, hemorrhage, hydrocephalus, extra-axial collection or mass lesion/mass effect. Vascular: No hyperdense vessel. Atherosclerotic calcifications of the internal and vertebral arteries at the skull base. Skull: Normal. Negative for fracture or focal lesion. Sinuses/Orbits: Visualized portions of the paranasal sinuses are predominantly clear. Orbits are grossly unremarkable. Other: Mastoid air cells are predominantly clear. CT CERVICAL SPINE FINDINGS  Alignment: Slight straightening of the normal cervical lordosis. Mild C1 on C2 retrolisthesis is unchanged from prior. Skull base and vertebrae: Slightly comminuted type 2 dens fracture with 4 mm of retrolisthesis is unchanged from prior. Similar appearance of the bilateral nondisplaced C1 posterior arch fractures. Soft tissues and spinal canal: Mild prevertebral swelling at C1-C2 is unchanged from prior. No visible canal hematoma. Disc levels: Multilevel degenerative changes spine unchanged from prior. Upper chest: Biapical pleuroparenchymal scarring. Other: None IMPRESSION: 1. Similar appearance of the type 2 dens fracture with 4 mm of retrolisthesis. 2. No significant interval change in the bilateral nondisplaced fractures of the C1 posterior arch. 3. No acute intracranial abnormality. 4. Similar extensive periventricular and subcortical white matter hypodensities which may be associated with known radiation. Electronically Signed   By: Dahlia Bailiff M.D.   On: 07/30/2022 16:47   CT Cervical Spine Wo Contrast  Result Date: 07/30/2022 CLINICAL DATA:  History of cervical spine fracture common noncompliant EXAM: CT HEAD WITHOUT CONTRAST CT CERVICAL SPINE WITHOUT CONTRAST TECHNIQUE: Multidetector CT imaging of the head and cervical spine was performed following the standard protocol without intravenous contrast. Multiplanar CT image reconstructions of the cervical spine were also generated. RADIATION DOSE REDUCTION: This exam was performed according to the departmental dose-optimization program which includes automated exposure control, adjustment of the mA and/or kV according to patient size and/or use of iterative reconstruction technique. COMPARISON:  CT August 4 ,2023. FINDINGS: CT HEAD FINDINGS Brain: Similar appearance of the extensive subcortical and periventricular white matter hypodensities. No evidence of acute infarction, hemorrhage, hydrocephalus, extra-axial collection or mass lesion/mass effect.  Vascular: No hyperdense vessel. Atherosclerotic calcifications of the internal and vertebral arteries at the skull base. Skull: Normal. Negative for fracture or focal lesion. Sinuses/Orbits: Visualized portions of the paranasal sinuses are predominantly clear. Orbits are grossly unremarkable. Other: Mastoid air cells are predominantly clear. CT CERVICAL SPINE FINDINGS Alignment: Slight straightening of the normal cervical lordosis. Mild C1 on C2 retrolisthesis is unchanged from prior. Skull base and vertebrae: Slightly comminuted type 2 dens fracture with 4 mm of retrolisthesis is unchanged from prior. Similar appearance of the bilateral nondisplaced C1 posterior arch  fractures. Soft tissues and spinal canal: Mild prevertebral swelling at C1-C2 is unchanged from prior. No visible canal hematoma. Disc levels: Multilevel degenerative changes spine unchanged from prior. Upper chest: Biapical pleuroparenchymal scarring. Other: None IMPRESSION: 1. Similar appearance of the type 2 dens fracture with 4 mm of retrolisthesis. 2. No significant interval change in the bilateral nondisplaced fractures of the C1 posterior arch. 3. No acute intracranial abnormality. 4. Similar extensive periventricular and subcortical white matter hypodensities which may be associated with known radiation. Electronically Signed   By: Dahlia Bailiff M.D.   On: 07/30/2022 16:47   DG Chest Port 1 View  Result Date: 07/30/2022 CLINICAL DATA:  Altered mental status. Increased lethargy and weakness. EXAM: PORTABLE CHEST 1 VIEW COMPARISON:  CT December 28, 2021. FINDINGS: Patient is rotated. Right chest Port-A-Cath with tip at the superior cavoatrial junction. Displacement of the mediastinum into the right hemithorax is similar to prior and at least somewhat accentuated by technique. Aortic atherosclerosis. Posttreatment change in the right lung. Small right pleural effusion with adjacent atelectasis/infiltrate. No acute osseous abnormality.  IMPRESSION: Small right pleural effusion with adjacent atelectasis/infiltrate. Posttreatment change in the right lung. Left lung is clear. Electronically Signed   By: Dahlia Bailiff M.D.   On: 07/30/2022 16:24     Labs:   Basic Metabolic Panel: Recent Labs  Lab 07/26/22 1404 07/30/22 1535 07/30/22 1904 07/31/22 0415 08/01/22 0500  NA 137 135 134* 137 138  K 3.8 3.7 3.8 3.3* 3.4*  CL 105 105  --  106 108  CO2 25 20*  --  23 23  GLUCOSE 97 91  --  118* 95  BUN 18 20  --  17 12  CREATININE 1.20* 1.21*  --  1.12* 1.00  CALCIUM 9.0 8.9  --  8.2* 8.2*  MG  --  1.7  --   --   --    GFR Estimated Creatinine Clearance: 56.5 mL/min (by C-G formula based on SCr of 1 mg/dL). Liver Function Tests: Recent Labs  Lab 07/26/22 1404 07/30/22 1535  AST 20 21  ALT 13 17  ALKPHOS 139* 153*  BILITOT 0.4 0.7  PROT 7.1 6.7  ALBUMIN 3.4* 2.7*   Recent Labs  Lab 07/30/22 1535  LIPASE 30   No results for input(s): "AMMONIA" in the last 168 hours. Coagulation profile No results for input(s): "INR", "PROTIME" in the last 168 hours.  CBC: Recent Labs  Lab 07/26/22 1404 07/30/22 1535 07/30/22 1904 07/31/22 0415  WBC 8.2 8.5  --  10.0  NEUTROABS 6.7 7.5  --   --   HGB 13.4 14.2 17.0* 11.7*  HCT 38.8 41.6 50.0* 35.0*  MCV 93.5 94.1  --  94.3  PLT 348 407*  --  375   Cardiac Enzymes: No results for input(s): "CKTOTAL", "CKMB", "CKMBINDEX", "TROPONINI" in the last 168 hours. BNP: Invalid input(s): "POCBNP" CBG: No results for input(s): "GLUCAP" in the last 168 hours. D-Dimer No results for input(s): "DDIMER" in the last 72 hours. Hgb A1c No results for input(s): "HGBA1C" in the last 72 hours. Lipid Profile No results for input(s): "CHOL", "HDL", "LDLCALC", "TRIG", "CHOLHDL", "LDLDIRECT" in the last 72 hours. Thyroid function studies Recent Labs    07/30/22 2230  TSH 30.590*   Anemia work up No results for input(s): "VITAMINB12", "FOLATE", "FERRITIN", "TIBC", "IRON",  "RETICCTPCT" in the last 72 hours. Microbiology No results found for this or any previous visit (from the past 240 hour(s)).   Discharge Instructions:   Discharge  Instructions     Diet - low sodium heart healthy   Complete by: As directed    Discharge instructions   Complete by: As directed    Be sure to take your synthroid on en EMPTY stomach 30 min prior to eating/drinking anything   Increase activity slowly   Complete by: As directed       Allergies as of 08/01/2022       Reactions   Lidocaine Swelling   SWELLING REACTION UNSPECIFIED         Medication List     STOP taking these medications    acetaminophen-codeine 300-30 MG tablet Commonly known as: TYLENOL #3   ALPRAZolam 0.5 MG tablet Commonly known as: XANAX   guaiFENesin-codeine 100-10 MG/5ML syrup Commonly known as: ROBITUSSIN AC   HYDROcodone-acetaminophen 7.5-325 mg/15 ml solution Commonly known as: HYCET       TAKE these medications    albuterol 108 (90 Base) MCG/ACT inhaler Commonly known as: VENTOLIN HFA Inhale 2 puffs into the lungs every 4 (four) hours as needed for wheezing.   azithromycin 250 MG tablet Commonly known as: ZITHROMAX 1 PO daily Start taking on: August 02, 2022   cyclobenzaprine 5 MG tablet Commonly known as: FLEXERIL Take 1 tablet by mouth 3 times/day as needed-between meals & bedtime.   diphenoxylate-atropine 2.5-0.025 MG tablet Commonly known as: Lomotil Take 1 tablet by mouth 4 (four) times daily as needed for diarrhea or loose stools.   Fluticasone-Salmeterol 500-50 MCG/DOSE Aepb Commonly known as: ADVAIR Inhale 1 puff into the lungs 2 (two) times daily.   levothyroxine 125 MCG tablet Commonly known as: SYNTHROID Take 1 tablet (125 mcg total) by mouth daily before breakfast. Start taking on: August 02, 2022 What changed:  medication strength See the new instructions.   nystatin 100000 UNIT/ML suspension Commonly known as: MYCOSTATIN Take 5 mLs (500,000  Units total) by mouth 4 (four) times daily.   ondansetron 8 MG disintegrating tablet Commonly known as: ZOFRAN-ODT Take 1 tablet (8 mg total) by mouth every 8 (eight) hours as needed for nausea or vomiting.   OVER THE COUNTER MEDICATION 1 capsule. [place] tail-"mushroom in a capsule"   oxyCODONE-acetaminophen 5-325 MG tablet Commonly known as: PERCOCET/ROXICET Take 1 tablet by mouth every 6 (six) hours as needed for severe pain.   predniSONE 20 MG tablet Commonly known as: DELTASONE Take 2 tablets (40 mg total) by mouth daily with breakfast for 2 days. Start taking on: August 02, 2022   prochlorperazine 10 MG tablet Commonly known as: COMPAZINE TAKE 1 TABLET(10 MG) BY MOUTH EVERY 6 HOURS AS NEEDED FOR NAUSEA OR VOMITING What changed: See the new instructions.   topiramate 50 MG tablet Commonly known as: TOPAMAX TAKE 1 TABLET(50 MG) BY MOUTH TWICE DAILY What changed:  how much to take how to take this when to take this   varenicline 0.5 MG X 11 & 1 MG X 42 tablet Commonly known as: CHANTIX PAK Take 2 tablets by mouth daily.               Durable Medical Equipment  (From admission, onward)           Start     Ordered   08/01/22 1021  For home use only DME Hospital bed  Once       Question Answer Comment  Length of Need Lifetime   Patient has (list medical condition): COPD   The above medical condition requires: Patient requires the ability to reposition immediately  Head must be elevated greater than: 45 degrees   Bed type Semi-electric   Support Surface: Gel Overlay      08/01/22 1021   08/01/22 0840  For home use only DME Hospital bed  Once       Question Answer Comment  Length of Need Lifetime   The above medical condition requires: Patient requires the ability to reposition frequently   Head must be elevated greater than: 30 degrees   Bed type Semi-electric      08/01/22 0840            Follow-up Information     [facility]  Follow up.   Specialty: Home Health Services Contact information: [place] [id number] [phone]         Mckinley Jewel, MD Follow up in 1 week(s).   Specialty: Internal Medicine Contact information: [address] [place] [address] [phone]         Orson Slick, MD Follow up.   Specialty: Hematology and Oncology Why: follow up re: CT Scan results and options for treatment Contact information: [address] [address] [phone]                  Time coordinating discharge: 45 min  Signed:  Geradine Girt DO  [facility] 08/01/2022, 1:16 PM

## 2022-08-01 NOTE — TOC Initial Note (Addendum)
Transition of Care [facility]) - Initial/Assessment Note    Patient Details  Name: Tammy Bowers MRN: [id number] Date of Birth: 06-12-1951  Transition [facility]) CM/SW Contact:    Marilu Favre, RN Phone Number: 08/01/2022, 10:36 AM  Clinical Narrative:                 Spoke to patient's husband Tammy Bowers via phone.   Updated address information.   PAtient from home with Jenny Reichmann.   Read PT note to Jenny Reichmann . Tammy Bowers can provide 24/7 care at home. He has been prior to admission.  Read to him her assistance level need and that she only ambulated 3 feet. Tammy Bowers voiced understanding and stated he can care for her at home.   Discussed home health PT. Jenny Reichmann voiced understanding that HHPT usually twice a week for about a hour at a time.    Patient will need hospital bed delivered to home prior to her discharge.    Called [place] with [facility] and provided Tammy Bowers's number to call for delivery today . NCM confirmed number with Tammy Bowers.  No preference for home health agency. NCM has left [place] with [facility] a message . Tommi Rumps with [facility] accepted referral   Patient will need transportation home    1530 Spoke to [facility] , hospital bed was delivered . Jenny Reichmann will come pick up belongings in a hour. PTAR requested for 5 pm   Expected Discharge Plan: [place] Barriers to Discharge: Continued Medical Work up   Patient Goals and CMS Choice   CMS Medicare.gov Compare Post Acute Care list provided to:: Other (Comment Required) (husband Fransisca Connors) Choice offered to / list presented to : Spouse  Expected Discharge Plan and Services Expected Discharge Plan: [place]   Discharge Planning Services: CM Consult Post Acute Care Choice: Home Health, Durable Medical Equipment Living arrangements for the past 2 months: Apartment                 DME Arranged: Hospital bed DME Agency: [employer] Date DME Agency Contacted: 08/01/22 Time DME Agency Contacted: 1034 Representative  spoke with at DME Agency: [place]: PT [place]: [place] Date [place]: 08/01/22 Time [place]: 25 Representative spoke with at [place]: Tommi Rumps  Prior Living Arrangements/Services Living arrangements for the past 2 months: Apartment Lives with:: Spouse Patient language and need for interpreter reviewed:: Yes        Need for Family Participation in Patient Care: Yes (Comment) Care giver support system in place?: Yes (comment) Current home services: DME Criminal Activity/Legal Involvement Pertinent to Current Situation/Hospitalization: No - Comment as needed  Activities of Daily Living   ADL Screening (condition at time of admission) Is the patient deaf or have difficulty hearing?: No Does the patient have difficulty seeing, even when wearing glasses/contacts?: No Does the patient have difficulty concentrating, remembering, or making decisions?: Yes Does the patient have difficulty dressing or bathing?: No Does the patient have difficulty walking or climbing stairs?: No  Permission Sought/Granted                  Emotional Assessment              Admission diagnosis:  COPD (chronic obstructive pulmonary disease) ([place]) [J44.9] Small cell lung cancer ([place]) [C34.90] History of cervical fracture [Z87.81] Community acquired pneumonia of right lung, unspecified part of lung [J18.9] Patient Active Problem List   Diagnosis Date Noted  Tobacco abuse 05/19/2022   Abnormal gait 03/29/2022   Aphasia 03/29/2022   Chronic kidney disease, stage 3b ([place]) 03/29/2022   Hemisensory deficit 03/29/2022   Incontinence of urine 03/29/2022   Malignant neoplasm of main bronchus ([place]) 03/29/2022   Recurrent falls 03/29/2022   Abnormal brain MRI 03/29/2022   Migraine headache 03/29/2022   TIA (transient ischemic attack) 11/27/2020   Hypothyroidism (acquired) 08/05/2020   Anorgasmia of female 07/30/2019   CINV (chemotherapy-induced  nausea and vomiting) 07/30/2019   Malignant neoplasm of unspecified part of unspecified bronchus or lung ([place]) 07/18/2019   Port-A-Cath in place 04/24/2019   Encounter for antineoplastic immunotherapy 02/23/2019   Goals of care, counseling/discussion 02/23/2019   Oral thrush 05/18/2017   Extravasation accident 03/07/2017   Pancytopenia due to antineoplastic chemotherapy ([place]) 11/19/2016   Neutropenic fever ([place]) 11/19/2016   Neutropenia ([place]) 11/19/2016   Bronchitis 11/19/2016   Small cell lung carcinoma, right ([place]) 10/27/2016   Encounter for antineoplastic chemotherapy 10/27/2016   Substance abuse ([place])    Hilar mass 10/10/2016   Lung nodules 10/10/2016   Adenopathy 10/10/2016   Mass of both adrenal glands ([place]) 10/10/2016   Asthma    COPD (chronic obstructive pulmonary disease) ([place])    Insomnia    Mood swing    UNSPECIFIED INFECTION OF BONE ANKLE AND FOOT 10/22/2009   PCP:  Mckinley Jewel, MD Pharmacy:   [facility] [place], [address] AT [address] [place] [address] Phone: [phone] Fax: [phone]  [id number], [place] - [address] AT [place] [address] [place] [address] Phone: [phone] Fax: [phone]  [id number] - [place], [place] - [address] [place] [address] Phone: [phone] Fax: [phone]     Social Determinants of Health (SDOH) Interventions    Readmission Risk Interventions     No data to display

## 2022-08-01 NOTE — Progress Notes (Signed)
HEMATOLOGY-ONCOLOGY PROGRESS NOTE  ASSESSMENT AND PLAN: Tammy Bowers 71 y.o. female with medical history significant for limited stage Small Cell Lung cancer who presents for a follow up visit.   #Small Cell Lung Cancer, Recurrent Limited Stage --Patient elected to discontinue treatment at last visit with Dr. Lorenso Courier on 09/13/2022. Patient has missed/cancelled visits and was lost to follow up afterwards.  -- Reestablish care on 07/26/2022 with medical oncology. --Last CT C/A/P on 9/23/222 showed stable disease with no clear evidence of progression --MRI brain last on 04/21/2022 shows no evidence of metastatic disease  --CT chest without contrast 07/30/2022 shows evidence of disease progression --I attempted to discuss the CT scan results today with the patient.  I was unable to get in touch with her husband.  The patient insisted that her cancer has not recurred.  She did not wish to discuss this further with me. --I recommend outpatient follow-up at the cancer center to discuss her CT scan results with her husband present.  We could consider a course of radiation for the airway obstruction if she is interested in pursuing treatment.  If she is not interested in treatment, we would recommend hospice.   Mikey Bussing  SUBJECTIVE: Tammy Bowers was previously followed by our office for recurrent small cell lung cancer.  She stopped treatment on 09/13/2022 per her request.  She then did not follow-up with [place] until 07/26/2022.  At that visit, it was recommended for restaging CT scans and then follow-up to discuss the results and treatment options.  Over the weekend, she was admitted for COPD exacerbation.  As part of her work-up she had a CT chest without contrast which showed progression/enlargement of the right hilar mass worrisome for disease recurrence/neoplasm with concern for collapse of the right middle lobe and right lower lobe.  She also had scattered tree-in-bud opacities throughout the right lung which  could be infectious/inflammatory versus neoplastic and she also had a few new scattered nodular densities in the right lung and left lung apex and metastatic disease is not excluded.  I saw the patient this afternoon in her hospital room.  Her husband was not at the bedside.  The patient states that she came to the hospital because of a broken neck.  She was seen in the emergency department on 07/22/2022 and was diagnosed with bilateral nondisplaced fractures of the posterior arch C1.  She is in a neck brace.  She denies coming to the hospital because of breathing issues.  We had some initial discussions today regarding her CT scan results and evidence of progressive cancer.  The patient insisted that this information was not true.  The patient will be discharging home today.  I attempted to call the patient's husband on his cell phone as he is not currently here.  I was unable to reach him.  Oncology History Overview Note  Patient presented with SOB and cough.  Work up showed right hilar mass.   Small cell lung carcinoma, right (HCC)   Staging form: Lung, AJCC 7th Edition   - Clinical stage from 10/27/2016: Stage IIIA (T2b, N2, M0) - Signed by Curt Bears, MD on 10/27/2016    Small cell lung carcinoma, right ([place])  10/11/2016 Imaging   CT Chest Right hilar 5.7 x 4.8 x 6.1 cm mass causing mass effect on the distal right pulmonary artery and right lower lobe bronchus and causing right middle lobe collapse. There are smaller nodular as well as ill-defined satellite lesions in the superior segment of  the right lower lobe, right precarinal lymphadenopathy and bilateral adrenal masses consistent with metastasis.   10/19/2016 Surgery   Bronchoscopy with endobronchial ultrasound and biopsy of right lung mass and #7 lymph nodes.     10/20/2016 Pathology Results   Lung, biopsy, Intermedial Bronchus - SMALL CELL CARCINOMA.   10/21/2016 Imaging   PET Large hypermetabolic right upper lobe lung mass  invading the right hilum and mediastinum and partially obstructing the right middle and lower lobe bronchi. Distal obstructive pneumonia versus interstitial spread of tumor in the right lower lobe.   10/27/2016 Initial Diagnosis   Small cell lung carcinoma, right ([facility])   11/07/2016 -  Chemotherapy   The patient had palonosetron (ALOXI) injection 0.25 mg, 0.25 mg, Intravenous,  Once, 3 of 5 cycles  pegfilgrastim (NEULASTA ONPRO KIT) injection 6 mg, 6 mg, Subcutaneous, Once, 2 of 4 cycles  CISplatin (PLATINOL) 127 mg in sodium chloride 0.9 % 500 mL chemo infusion, 60 mg/m2 = 127 mg, Intravenous,  Once, 3 of 5 cycles  etoposide (VEPESID) 250 mg in sodium chloride 0.9 % 1,000 mL chemo infusion, 120 mg/m2 = 250 mg, Intravenous,  Once, 3 of 5 cycles  fosaprepitant (EMEND) 150 mg, dexamethasone (DECADRON) 12 mg in sodium chloride 0.9 % 145 mL IVPB, , Intravenous,  Once, 3 of 5 cycles  for chemotherapy treatment.     12/02/2016 -  Radiation Therapy   SIM    03/06/2019 -  Chemotherapy    Patient is on Treatment Plan: LUNG SCLC CARBOPLATIN + ETOPOSIDE + ATEZOLIZUMAB INDUCTION Q21D / ATEZOLIZUMAB MAINTENANCE Q21D         REVIEW OF SYSTEMS:   Review of Systems  Constitutional:  Positive for malaise/fatigue and weight loss.  Respiratory:  Positive for cough and shortness of breath.   Cardiovascular: Negative.   Gastrointestinal:  Negative for abdominal pain, nausea and vomiting.  Musculoskeletal:  Positive for neck pain.  Neurological:  Negative for dizziness and headaches.    I have reviewed the past medical history, past surgical history, social history and family history with the patient and they are unchanged from previous note.   PHYSICAL EXAMINATION: ECOG PERFORMANCE STATUS: 3 - Symptomatic, >50% confined to bed  Vitals:   08/01/22 0735 08/01/22 0900  BP: (!) 140/106   Pulse: 86   Resp: 18   Temp: 98.3 F (36.8 C)   SpO2: 96% 96%   Filed Weights   07/31/22 2053   Weight: 79.5 kg    Intake/Output from previous day: 08/13 0701 - 08/14 0700 In: 2203.9 [P.O.:400; I.V.:1803.9] Out: 1700 [Urine:1700]  Physical Exam Vitals reviewed.  Constitutional:      Appearance: She is ill-appearing.  Pulmonary:     Effort: Pulmonary effort is normal. No respiratory distress.  Skin:    General: Skin is warm and dry.  Neurological:     Mental Status: She is alert. She is disoriented.     LABORATORY DATA:  I have reviewed the data as listed    Latest Ref Rng & Units 08/01/2022    5:00 AM 07/31/2022    4:15 AM 07/30/2022    7:04 PM  CMP  Glucose 70 - 99 mg/dL 95  118    BUN 8 - 23 mg/dL 12  17    Creatinine 0.44 - 1.00 mg/dL 1.00  1.12    Sodium 135 - 145 mmol/L 138  137  134   Potassium 3.5 - 5.1 mmol/L 3.4  3.3  3.8   Chloride 98 - 111  mmol/L 108  106    CO2 22 - 32 mmol/L 23  23    Calcium 8.9 - 10.3 mg/dL 8.2  8.2      Lab Results  Component Value Date   WBC 10.0 07/31/2022   HGB 11.7 (L) 07/31/2022   HCT 35.0 (L) 07/31/2022   MCV 94.3 07/31/2022   PLT 375 07/31/2022   NEUTROABS 7.5 07/30/2022    No results found for: "CEA1", "CEA", "CAN199", "CA125", "PSA1"  ECHOCARDIOGRAM COMPLETE  Result Date: 07/31/2022    ECHOCARDIOGRAM REPORT   Patient Name:   KATELEEN ENCARNACION Date of Exam: 07/31/2022 Medical Rec #:  [id number]   Height:       66.0 in Accession #:    [id number]  Weight:       175.2 lb Date of Birth:  1951/09/11    BSA:          1.891 m Patient Age:    49 years    BP:           146/96 mmHg Patient Gender: F           HR:           90 bpm. Exam Location:  Inpatient Procedure: 2D Echo Indications:    pulmonary hypertension  History:        Patient has no prior history of Echocardiogram examinations.                 COPD and Cancer, Arrythmias:Atrial Fibrillation,                 Signs/Symptoms:Shortness of Breath; Risk Factors:Current Smoker.  Sonographer:    Johny Chess RDCS Referring Phys: [id number] Lequita Halt  Sonographer Comments:  Technically difficult study due to poor echo windows. Image acquisition challenging due to respiratory motion. IMPRESSIONS  1. Left ventricular ejection fraction, by estimation, is 60 to 65%. The left ventricle has normal function. Left ventricular endocardial border not optimally defined to evaluate regional wall motion. Left ventricular diastolic parameters are indeterminate.  2. Right ventricular systolic function is normal. The right ventricular size is normal. Tricuspid regurgitation signal is inadequate for assessing PA pressure.  3. A small pericardial effusion is present. The pericardial effusion is anterior to the right ventricle. There is no evidence of cardiac tamponade.  4. The mitral valve is normal in structure. No evidence of mitral valve regurgitation. No evidence of mitral stenosis.  5. The aortic valve is tricuspid. Aortic valve regurgitation is not visualized. No aortic stenosis is present.  6. Technically difficult study- suspect related to lung windows and COPD. Comparison(s): No prior Echocardiogram. FINDINGS  Left Ventricle: Left ventricular ejection fraction, by estimation, is 60 to 65%. The left ventricle has normal function. Left ventricular endocardial border not optimally defined to evaluate regional wall motion. The left ventricular internal cavity size was normal in size. There is no left ventricular hypertrophy. Left ventricular diastolic parameters are indeterminate. Right Ventricle: The right ventricular size is normal. No increase in right ventricular wall thickness. Right ventricular systolic function is normal. Tricuspid regurgitation signal is inadequate for assessing PA pressure. Left Atrium: Left atrial size was normal in size. Right Atrium: Right atrial size was normal in size. Pericardium: A small pericardial effusion is present. The pericardial effusion is anterior to the right ventricle. There is no evidence of cardiac tamponade. Mitral Valve: The mitral valve is normal in  structure. No evidence of mitral valve regurgitation. No evidence of mitral valve stenosis. Tricuspid Valve: The  tricuspid valve is grossly normal. Tricuspid valve regurgitation is not demonstrated. Aortic Valve: The aortic valve is tricuspid. Aortic valve regurgitation is not visualized. No aortic stenosis is present. Pulmonic Valve: The pulmonic valve was normal in structure. Pulmonic valve regurgitation is not visualized. No evidence of pulmonic stenosis. Aorta: The aortic root, ascending aorta and aortic arch are all structurally normal, with no evidence of dilitation or obstruction. IAS/Shunts: No atrial level shunt detected by color flow Doppler.  LEFT VENTRICLE PLAX 2D LVIDd:         4.50 cm LVIDs:         3.00 cm LV PW:         0.90 cm LV IVS:        0.90 cm LVOT diam:     2.10 cm LV SV:         50 LV SV Index:   27 LVOT Area:     3.46 cm  RIGHT VENTRICLE            IVC RV S prime:     9.79 cm/s  IVC diam: 1.80 cm TAPSE (M-mode): 1.3 cm LEFT ATRIUM             Index LA diam:        3.00 cm 1.59 cm/m LA Vol (A2C):   21.3 ml 11.27 ml/m LA Vol (A4C):   22.2 ml 11.74 ml/m LA Biplane Vol: 21.4 ml 11.32 ml/m  AORTIC VALVE LVOT Vmax:   75.10 cm/s LVOT Vmean:  49.400 cm/s LVOT VTI:    0.145 m  AORTA Ao Root diam: 3.20 cm MITRAL VALVE MV Area (PHT): 4.15 cm    SHUNTS MV Decel Time: 183 msec    Systemic VTI:  0.14 m MV E velocity: 60.20 cm/s  Systemic Diam: 2.10 cm MV A velocity: 65.60 cm/s MV E/A ratio:  0.92 Rudean Haskell MD Electronically signed by Rudean Haskell MD Signature Date/Time: 07/31/2022/5:13:04 PM    Final    CT Chest Wo Contrast  Result Date: 07/30/2022 CLINICAL DATA:  Pneumonia. EXAM: CT CHEST WITHOUT CONTRAST TECHNIQUE: Multidetector CT imaging of the chest was performed following the standard protocol without IV contrast. RADIATION DOSE REDUCTION: This exam was performed according to the departmental dose-optimization program which includes automated exposure control, adjustment  of the mA and/or kV according to patient size and/or use of iterative reconstruction technique. COMPARISON:  Chest x-ray same day. CT chest abdomen and pelvis 12/28/2021. FINDINGS: Cardiovascular: Heart and aorta are normal in size. There is no pericardial effusion. There are atherosclerotic calcifications of the aorta and coronary arteries. Right chest port catheter tip ends in the distal SVC. Again seen is narrowing of the superior vena cava as it approximates the right atrium, unchanged. Mediastinum/Nodes: Enlargement of right hilar and perihilar mass identified in the interval. Discrete margins are difficult to measure secondary to lack of contrast but are proximally 4.6 x 4.9 by 4.9 cm. This extends into the subcarinal space. Calcified subcarinal and right hilar lymph nodes are again seen. The esophagus is nondilated. The thyroid gland is not well seen. No other separate discrete enlarged lymph nodes are identified. Lungs/Pleura: There is new cutoff of the distal right mainstem bronchus. There is subsequent collapse of the right middle lobe and right lower lobe. There are scattered tree-in-bud opacities throughout the mid and inferior right lung. There is a new 8 mm nodule in the inferior right lung image 2/81. There is a new nodular density measuring 1 cm in the right  apex image 4/45. New bilobed nodule with larger component measuring 4 mm in the left upper lobe image 4/15. There is new volume loss on the right with a new small right pleural effusion. Upper Abdomen: Right adrenal nodule measuring up to 3.6 cm appears unchanged. There are punctate left renal calculi without hydronephrosis. Musculoskeletal: No chest wall mass or suspicious bone lesions identified. IMPRESSION: 1. Progression/enlargement of right hilar mass worrisome for disease recurrence/neoplasm. There is new cutoff of the distal right mainstem bronchus with collapse of the right middle lobe and right lower lobe. 2. New small right pleural  effusion. 3. Scattered tree-in-bud opacities throughout the right lung may be infectious/inflammatory or neoplastic. 4. There are few new scattered nodular densities in the right lung and left lung apex. Metastatic disease not excluded. 5. Unchanged right adrenal nodule. Electronically Signed   By: Ronney Asters M.D.   On: 07/30/2022 18:29   CT Head Wo Contrast  Result Date: 07/30/2022 CLINICAL DATA:  History of cervical spine fracture common noncompliant EXAM: CT HEAD WITHOUT CONTRAST CT CERVICAL SPINE WITHOUT CONTRAST TECHNIQUE: Multidetector CT imaging of the head and cervical spine was performed following the standard protocol without intravenous contrast. Multiplanar CT image reconstructions of the cervical spine were also generated. RADIATION DOSE REDUCTION: This exam was performed according to the departmental dose-optimization program which includes automated exposure control, adjustment of the mA and/or kV according to patient size and/or use of iterative reconstruction technique. COMPARISON:  CT August 4 ,2023. FINDINGS: CT HEAD FINDINGS Brain: Similar appearance of the extensive subcortical and periventricular white matter hypodensities. No evidence of acute infarction, hemorrhage, hydrocephalus, extra-axial collection or mass lesion/mass effect. Vascular: No hyperdense vessel. Atherosclerotic calcifications of the internal and vertebral arteries at the skull base. Skull: Normal. Negative for fracture or focal lesion. Sinuses/Orbits: Visualized portions of the paranasal sinuses are predominantly clear. Orbits are grossly unremarkable. Other: Mastoid air cells are predominantly clear. CT CERVICAL SPINE FINDINGS Alignment: Slight straightening of the normal cervical lordosis. Mild C1 on C2 retrolisthesis is unchanged from prior. Skull base and vertebrae: Slightly comminuted type 2 dens fracture with 4 mm of retrolisthesis is unchanged from prior. Similar appearance of the bilateral nondisplaced C1  posterior arch fractures. Soft tissues and spinal canal: Mild prevertebral swelling at C1-C2 is unchanged from prior. No visible canal hematoma. Disc levels: Multilevel degenerative changes spine unchanged from prior. Upper chest: Biapical pleuroparenchymal scarring. Other: None IMPRESSION: 1. Similar appearance of the type 2 dens fracture with 4 mm of retrolisthesis. 2. No significant interval change in the bilateral nondisplaced fractures of the C1 posterior arch. 3. No acute intracranial abnormality. 4. Similar extensive periventricular and subcortical white matter hypodensities which may be associated with known radiation. Electronically Signed   By: Dahlia Bailiff M.D.   On: 07/30/2022 16:47   CT Cervical Spine Wo Contrast  Result Date: 07/30/2022 CLINICAL DATA:  History of cervical spine fracture common noncompliant EXAM: CT HEAD WITHOUT CONTRAST CT CERVICAL SPINE WITHOUT CONTRAST TECHNIQUE: Multidetector CT imaging of the head and cervical spine was performed following the standard protocol without intravenous contrast. Multiplanar CT image reconstructions of the cervical spine were also generated. RADIATION DOSE REDUCTION: This exam was performed according to the departmental dose-optimization program which includes automated exposure control, adjustment of the mA and/or kV according to patient size and/or use of iterative reconstruction technique. COMPARISON:  CT August 4 ,2023. FINDINGS: CT HEAD FINDINGS Brain: Similar appearance of the extensive subcortical and periventricular white matter hypodensities. No evidence of acute  infarction, hemorrhage, hydrocephalus, extra-axial collection or mass lesion/mass effect. Vascular: No hyperdense vessel. Atherosclerotic calcifications of the internal and vertebral arteries at the skull base. Skull: Normal. Negative for fracture or focal lesion. Sinuses/Orbits: Visualized portions of the paranasal sinuses are predominantly clear. Orbits are grossly unremarkable.  Other: Mastoid air cells are predominantly clear. CT CERVICAL SPINE FINDINGS Alignment: Slight straightening of the normal cervical lordosis. Mild C1 on C2 retrolisthesis is unchanged from prior. Skull base and vertebrae: Slightly comminuted type 2 dens fracture with 4 mm of retrolisthesis is unchanged from prior. Similar appearance of the bilateral nondisplaced C1 posterior arch fractures. Soft tissues and spinal canal: Mild prevertebral swelling at C1-C2 is unchanged from prior. No visible canal hematoma. Disc levels: Multilevel degenerative changes spine unchanged from prior. Upper chest: Biapical pleuroparenchymal scarring. Other: None IMPRESSION: 1. Similar appearance of the type 2 dens fracture with 4 mm of retrolisthesis. 2. No significant interval change in the bilateral nondisplaced fractures of the C1 posterior arch. 3. No acute intracranial abnormality. 4. Similar extensive periventricular and subcortical white matter hypodensities which may be associated with known radiation. Electronically Signed   By: Dahlia Bailiff M.D.   On: 07/30/2022 16:47   DG Chest Port 1 View  Result Date: 07/30/2022 CLINICAL DATA:  Altered mental status. Increased lethargy and weakness. EXAM: PORTABLE CHEST 1 VIEW COMPARISON:  CT December 28, 2021. FINDINGS: Patient is rotated. Right chest Port-A-Cath with tip at the superior cavoatrial junction. Displacement of the mediastinum into the right hemithorax is similar to prior and at least somewhat accentuated by technique. Aortic atherosclerosis. Posttreatment change in the right lung. Small right pleural effusion with adjacent atelectasis/infiltrate. No acute osseous abnormality. IMPRESSION: Small right pleural effusion with adjacent atelectasis/infiltrate. Posttreatment change in the right lung. Left lung is clear. Electronically Signed   By: Dahlia Bailiff M.D.   On: 07/30/2022 16:24   MR Cervical Spine Wo Contrast  Result Date: 07/22/2022 CLINICAL DATA:  Neck trauma.  EXAM: MRI CERVICAL SPINE WITHOUT CONTRAST TECHNIQUE: Multiplanar, multisequence MR imaging of the cervical spine was performed. No intravenous contrast was administered. COMPARISON:  Cervical spine CT from earlier today FINDINGS: Alignment: Mild anterolisthesis at C6-7 Vertebrae: Known type 2 dens fracture with posterior displacement measuring 4 mm. On sagittal T2 weighted imaging the ligamentum flavum and posterior longitudinal ligament appear intact. Fluid signal cleft seen in the region of the anterior longitudinal ligament as marked on 11: 9 but a partial injury given continuous ligament is present based on 5:8. There are C1 posterior ring fractures by CT which are inapparent on this study. Cord: No spinal canal hematoma or cord edema Posterior Fossa, vertebral arteries, paraspinal tissues: Prevertebral swelling at the level of fracture, expected. Disc levels: Mild disc space narrowing and ridging at C4-5 and C5-6. no degenerative cord impingement IMPRESSION: Acute type 2 dens fracture with 4 mm of posterior displacement. No cord impingement or injury. Electronically Signed   By: Jorje Guild M.D.   On: 07/22/2022 06:26   CT Head Wo Contrast  Result Date: 07/22/2022 CLINICAL DATA:  Fall, head and neck trauma. EXAM: CT HEAD WITHOUT CONTRAST CT CERVICAL SPINE WITHOUT CONTRAST TECHNIQUE: Multidetector CT imaging of the head and cervical spine was performed following the standard protocol without intravenous contrast. Multiplanar CT image reconstructions of the cervical spine were also generated. RADIATION DOSE REDUCTION: This exam was performed according to the departmental dose-optimization program which includes automated exposure control, adjustment of the mA and/or kV according to patient size and/or use of  iterative reconstruction technique. COMPARISON:  04/21/2022. FINDINGS: CT HEAD FINDINGS Brain: No acute intracranial hemorrhage, midline shift or mass effect. No extra-axial fluid collection. Extensive  subcortical and periventricular white matter hypodensities are present bilaterally. No hydrocephalus. Vascular: No hyperdense vessel or unexpected calcification. Skull: Normal. Negative for fracture or focal lesion. Sinuses/Orbits: No acute finding. Other: There is a small scalp hematoma over the frontal bone on the left anteriorly. CT CERVICAL SPINE FINDINGS Alignment: Normal. Skull base and vertebrae: There is a slightly comminuted fracture through the body of the dens with a proximally 4 mm posterior displacement. There are nondisplaced fractures of the posterior arches at C1 bilaterally. The remaining bony structures are intact. Soft tissues and spinal canal: No visible canal hematoma. Mild prevertebral soft tissue edema is present anterior to C1 and C2. Disc levels: Mild intervertebral disc space narrowing and uncovertebral osteophyte formation is noted at C4-C5 and C5-C6. No significant spinal canal stenosis. Neural foraminal stenosis is present at C4-C5 and C5-C6 on the right. Upper chest: Pleural thickening or atelectasis is noted at the right lung apex. Other: None. IMPRESSION: 1. No acute intracranial hemorrhage. 2. Extensive periventricular and subcortical white matter hypodensities bilaterally, which may be associated with known irradiation. 3. Slightly comminuted fracture of the base of the dens with 4 mm posterior displacement of the dens. Surgical consultation is recommended. 4. Bilateral nondisplaced fractures of the posterior arch at C1. Critical findings were reported to Dr. Dina Rich at 4:37 a.m. Electronically Signed   By: Brett Fairy M.D.   On: 07/22/2022 04:38   CT Cervical Spine Wo Contrast  Result Date: 07/22/2022 CLINICAL DATA:  Fall, head and neck trauma. EXAM: CT HEAD WITHOUT CONTRAST CT CERVICAL SPINE WITHOUT CONTRAST TECHNIQUE: Multidetector CT imaging of the head and cervical spine was performed following the standard protocol without intravenous contrast. Multiplanar CT image  reconstructions of the cervical spine were also generated. RADIATION DOSE REDUCTION: This exam was performed according to the departmental dose-optimization program which includes automated exposure control, adjustment of the mA and/or kV according to patient size and/or use of iterative reconstruction technique. COMPARISON:  04/21/2022. FINDINGS: CT HEAD FINDINGS Brain: No acute intracranial hemorrhage, midline shift or mass effect. No extra-axial fluid collection. Extensive subcortical and periventricular white matter hypodensities are present bilaterally. No hydrocephalus. Vascular: No hyperdense vessel or unexpected calcification. Skull: Normal. Negative for fracture or focal lesion. Sinuses/Orbits: No acute finding. Other: There is a small scalp hematoma over the frontal bone on the left anteriorly. CT CERVICAL SPINE FINDINGS Alignment: Normal. Skull base and vertebrae: There is a slightly comminuted fracture through the body of the dens with a proximally 4 mm posterior displacement. There are nondisplaced fractures of the posterior arches at C1 bilaterally. The remaining bony structures are intact. Soft tissues and spinal canal: No visible canal hematoma. Mild prevertebral soft tissue edema is present anterior to C1 and C2. Disc levels: Mild intervertebral disc space narrowing and uncovertebral osteophyte formation is noted at C4-C5 and C5-C6. No significant spinal canal stenosis. Neural foraminal stenosis is present at C4-C5 and C5-C6 on the right. Upper chest: Pleural thickening or atelectasis is noted at the right lung apex. Other: None. IMPRESSION: 1. No acute intracranial hemorrhage. 2. Extensive periventricular and subcortical white matter hypodensities bilaterally, which may be associated with known irradiation. 3. Slightly comminuted fracture of the base of the dens with 4 mm posterior displacement of the dens. Surgical consultation is recommended. 4. Bilateral nondisplaced fractures of the posterior  arch at C1. Critical findings were  reported to Dr. Dina Rich at 4:37 a.m. Electronically Signed   By: Brett Fairy M.D.   On: 07/22/2022 04:38     Future Appointments  Date Time Provider [place]  08/30/2022 10:00 AM [facility] None  08/30/2022 10:40 AM Orson Slick, MD [facility] None  09/14/2022  1:00 PM [facility] P[facility] ROOM [facility] None  09/14/2022  2:00 PM Rigoberto Noel, MD [facility] None  10/04/2022  4:00 PM Penumalli, Earlean Polka, MD [facility] None      LOS: 1 day

## 2022-08-01 NOTE — Progress Notes (Addendum)
Call from tele reporting HR ST up to 170s with artifact noted. Upon assessment, pt is ambulating from bed to chair with assistance from tech. HR at the time sustaining 150s. Dyspnea with exhertion noted but SpO2 maintains 97% on 2L [place]. After resting in chair after ambulation, pt HR returned to baseline 105 AF.

## 2022-08-01 NOTE — Progress Notes (Signed)
Discharge instructions given at this time. Spoke with pt's husband via telephone regarding follow up appointments and prescriptions. Pt still awaiting [facility] transport

## 2022-08-01 NOTE — Progress Notes (Signed)
Mobility Specialist Progress Note:   08/01/22 1230  Mobility  Activity Transferred from chair to bed  Level of Assistance Moderate assist, patient does 50-74%  Assistive Device Front wheel walker  Distance Ambulated (ft) 3 ft  Activity Response Tolerated well  $Mobility charge 1 Mobility    Pt requesting to transfer back to bed. Required modA to stand from recliner, min guard to minA while taking steps. Pt left in bed with all needs met, bed alarm on. Sat note to follow.  [facility] Secure Chat or Office Phone: [phone]

## 2022-08-01 NOTE — TOC CM/SW Note (Signed)
    Durable Medical Equipment  (From admission, onward)           Start     Ordered   08/01/22 1021  For home use only DME Hospital bed  Once       Question Answer Comment  Length of Need Lifetime   Patient has (list medical condition): COPD   The above medical condition requires: Patient requires the ability to reposition immediately   Head must be elevated greater than: 45 degrees   Bed type Semi-electric   Support Surface: Gel Overlay      08/01/22 1021   08/01/22 0840  For home use only DME Hospital bed  Once       Question Answer Comment  Length of Need Lifetime   The above medical condition requires: Patient requires the ability to reposition frequently   Head must be elevated greater than: 30 degrees   Bed type Semi-electric      08/01/22 0840

## 2022-08-01 NOTE — Progress Notes (Signed)
[facility] arrived to transport pt stable condition back home.

## 2022-08-01 NOTE — Progress Notes (Signed)
Mobility Specialist Progress Note:   08/01/22 1030  Mobility  Activity Transferred from bed to chair  Level of Assistance Minimal assist, patient does 75% or more  Assistive Device Front wheel walker  Distance Ambulated (ft) 3 ft  Activity Response Tolerated well  $Mobility charge 1 Mobility   Pt eager to transfer to chair. Required minA to stand and pivot to recliner. Pt left with all needs met, chair alarm on.   [facility] Secure Chat or Office Phone: [phone]

## 2022-08-02 ENCOUNTER — Telehealth: Payer: Self-pay

## 2022-08-02 NOTE — Telephone Encounter (Signed)
Husband called with concerns about patient's most recent CT scan and what the findings mean. Relayed information that was discussed during discussion between pt and Mikey Bussing NP on 08/01/22. Erasmo Downer was unable to reach husband by phone. Reviewed upcoming appointment with Dr. Lorenso Courier on 08/09/22.

## 2022-08-04 DIAGNOSIS — G4700 Insomnia, unspecified: Secondary | ICD-10-CM | POA: Diagnosis not present

## 2022-08-04 DIAGNOSIS — S0083XD Contusion of other part of head, subsequent encounter: Secondary | ICD-10-CM | POA: Diagnosis not present

## 2022-08-04 DIAGNOSIS — Z6828 Body mass index (BMI) 28.0-28.9, adult: Secondary | ICD-10-CM | POA: Diagnosis not present

## 2022-08-04 DIAGNOSIS — S5002XD Contusion of left elbow, subsequent encounter: Secondary | ICD-10-CM | POA: Diagnosis not present

## 2022-08-04 DIAGNOSIS — M4312 Spondylolisthesis, cervical region: Secondary | ICD-10-CM | POA: Diagnosis not present

## 2022-08-04 DIAGNOSIS — Z8701 Personal history of pneumonia (recurrent): Secondary | ICD-10-CM | POA: Diagnosis not present

## 2022-08-04 DIAGNOSIS — F17210 Nicotine dependence, cigarettes, uncomplicated: Secondary | ICD-10-CM | POA: Diagnosis not present

## 2022-08-04 DIAGNOSIS — J90 Pleural effusion, not elsewhere classified: Secondary | ICD-10-CM | POA: Diagnosis not present

## 2022-08-04 DIAGNOSIS — Z7951 Long term (current) use of inhaled steroids: Secondary | ICD-10-CM | POA: Diagnosis not present

## 2022-08-04 DIAGNOSIS — J9811 Atelectasis: Secondary | ICD-10-CM | POA: Diagnosis not present

## 2022-08-04 DIAGNOSIS — Z85118 Personal history of other malignant neoplasm of bronchus and lung: Secondary | ICD-10-CM | POA: Diagnosis not present

## 2022-08-04 DIAGNOSIS — S60011D Contusion of right thumb without damage to nail, subsequent encounter: Secondary | ICD-10-CM | POA: Diagnosis not present

## 2022-08-04 DIAGNOSIS — E669 Obesity, unspecified: Secondary | ICD-10-CM | POA: Diagnosis not present

## 2022-08-04 DIAGNOSIS — D630 Anemia in neoplastic disease: Secondary | ICD-10-CM | POA: Diagnosis not present

## 2022-08-04 DIAGNOSIS — C3491 Malignant neoplasm of unspecified part of right bronchus or lung: Secondary | ICD-10-CM | POA: Diagnosis not present

## 2022-08-04 DIAGNOSIS — G43909 Migraine, unspecified, not intractable, without status migrainosus: Secondary | ICD-10-CM | POA: Diagnosis not present

## 2022-08-04 DIAGNOSIS — Z87442 Personal history of urinary calculi: Secondary | ICD-10-CM | POA: Diagnosis not present

## 2022-08-04 DIAGNOSIS — Z7952 Long term (current) use of systemic steroids: Secondary | ICD-10-CM | POA: Diagnosis not present

## 2022-08-04 DIAGNOSIS — J441 Chronic obstructive pulmonary disease with (acute) exacerbation: Secondary | ICD-10-CM | POA: Diagnosis not present

## 2022-08-04 DIAGNOSIS — E039 Hypothyroidism, unspecified: Secondary | ICD-10-CM | POA: Diagnosis not present

## 2022-08-04 DIAGNOSIS — M4050 Lordosis, unspecified, site unspecified: Secondary | ICD-10-CM | POA: Diagnosis not present

## 2022-08-04 DIAGNOSIS — Z9181 History of falling: Secondary | ICD-10-CM | POA: Diagnosis not present

## 2022-08-04 DIAGNOSIS — S12091D Other nondisplaced fracture of first cervical vertebra, subsequent encounter for fracture with routine healing: Secondary | ICD-10-CM | POA: Diagnosis not present

## 2022-08-08 ENCOUNTER — Other Ambulatory Visit: Payer: Self-pay | Admitting: *Deleted

## 2022-08-08 DIAGNOSIS — J90 Pleural effusion, not elsewhere classified: Secondary | ICD-10-CM | POA: Diagnosis not present

## 2022-08-08 DIAGNOSIS — S12091D Other nondisplaced fracture of first cervical vertebra, subsequent encounter for fracture with routine healing: Secondary | ICD-10-CM | POA: Diagnosis not present

## 2022-08-08 DIAGNOSIS — J441 Chronic obstructive pulmonary disease with (acute) exacerbation: Secondary | ICD-10-CM | POA: Diagnosis not present

## 2022-08-08 DIAGNOSIS — D630 Anemia in neoplastic disease: Secondary | ICD-10-CM | POA: Diagnosis not present

## 2022-08-08 DIAGNOSIS — M4050 Lordosis, unspecified, site unspecified: Secondary | ICD-10-CM | POA: Diagnosis not present

## 2022-08-08 DIAGNOSIS — C3491 Malignant neoplasm of unspecified part of right bronchus or lung: Secondary | ICD-10-CM | POA: Diagnosis not present

## 2022-08-09 ENCOUNTER — Inpatient Hospital Stay: Payer: Medicare Other | Admitting: Hematology and Oncology

## 2022-08-09 ENCOUNTER — Other Ambulatory Visit: Payer: Medicare Other

## 2022-08-09 ENCOUNTER — Inpatient Hospital Stay: Payer: Medicare Other

## 2022-08-09 ENCOUNTER — Telehealth: Payer: Self-pay | Admitting: *Deleted

## 2022-08-09 NOTE — Telephone Encounter (Signed)
Patient missed appts 08/09/22. Called patient's spouse @ 1215 when she had not arrived to 1130 port flush/lab appt. Was to have appt w/Dr. Lorenso Courier at 1200. LVM for husband informing them of missed appts and asked him to contact office at earliest convenience. Tammy Bowers called office at 1330. He stated Tammy Bowers did not feel well enough to come to appts today - she was having difficulty getting in and out of car. He said they did want to talk about the scan results with Dr. Lorenso Courier and asked if she could make a phone appt?  Advised [name] that Dr. Lorenso Courier will receive this message and request for telephone appt. He verbalized understanding.

## 2022-08-10 DIAGNOSIS — D630 Anemia in neoplastic disease: Secondary | ICD-10-CM | POA: Diagnosis not present

## 2022-08-10 DIAGNOSIS — J90 Pleural effusion, not elsewhere classified: Secondary | ICD-10-CM | POA: Diagnosis not present

## 2022-08-10 DIAGNOSIS — C3491 Malignant neoplasm of unspecified part of right bronchus or lung: Secondary | ICD-10-CM | POA: Diagnosis not present

## 2022-08-10 DIAGNOSIS — M4050 Lordosis, unspecified, site unspecified: Secondary | ICD-10-CM | POA: Diagnosis not present

## 2022-08-10 DIAGNOSIS — S12091D Other nondisplaced fracture of first cervical vertebra, subsequent encounter for fracture with routine healing: Secondary | ICD-10-CM | POA: Diagnosis not present

## 2022-08-10 DIAGNOSIS — J441 Chronic obstructive pulmonary disease with (acute) exacerbation: Secondary | ICD-10-CM | POA: Diagnosis not present

## 2022-08-11 DIAGNOSIS — S12091D Other nondisplaced fracture of first cervical vertebra, subsequent encounter for fracture with routine healing: Secondary | ICD-10-CM | POA: Diagnosis not present

## 2022-08-11 DIAGNOSIS — D630 Anemia in neoplastic disease: Secondary | ICD-10-CM | POA: Diagnosis not present

## 2022-08-11 DIAGNOSIS — M4050 Lordosis, unspecified, site unspecified: Secondary | ICD-10-CM | POA: Diagnosis not present

## 2022-08-11 DIAGNOSIS — J90 Pleural effusion, not elsewhere classified: Secondary | ICD-10-CM | POA: Diagnosis not present

## 2022-08-11 DIAGNOSIS — C3491 Malignant neoplasm of unspecified part of right bronchus or lung: Secondary | ICD-10-CM | POA: Diagnosis not present

## 2022-08-11 DIAGNOSIS — J441 Chronic obstructive pulmonary disease with (acute) exacerbation: Secondary | ICD-10-CM | POA: Diagnosis not present

## 2022-08-12 ENCOUNTER — Telehealth: Payer: Self-pay

## 2022-08-12 ENCOUNTER — Inpatient Hospital Stay (HOSPITAL_BASED_OUTPATIENT_CLINIC_OR_DEPARTMENT_OTHER): Payer: Medicare Other | Admitting: Hematology and Oncology

## 2022-08-12 DIAGNOSIS — B370 Candidal stomatitis: Secondary | ICD-10-CM | POA: Diagnosis not present

## 2022-08-12 DIAGNOSIS — R110 Nausea: Secondary | ICD-10-CM

## 2022-08-12 DIAGNOSIS — F17210 Nicotine dependence, cigarettes, uncomplicated: Secondary | ICD-10-CM | POA: Diagnosis not present

## 2022-08-12 DIAGNOSIS — C3490 Malignant neoplasm of unspecified part of unspecified bronchus or lung: Secondary | ICD-10-CM | POA: Diagnosis not present

## 2022-08-12 DIAGNOSIS — Z95828 Presence of other vascular implants and grafts: Secondary | ICD-10-CM

## 2022-08-12 DIAGNOSIS — C3491 Malignant neoplasm of unspecified part of right bronchus or lung: Secondary | ICD-10-CM

## 2022-08-12 DIAGNOSIS — D630 Anemia in neoplastic disease: Secondary | ICD-10-CM | POA: Diagnosis not present

## 2022-08-12 DIAGNOSIS — J90 Pleural effusion, not elsewhere classified: Secondary | ICD-10-CM | POA: Diagnosis not present

## 2022-08-12 DIAGNOSIS — J441 Chronic obstructive pulmonary disease with (acute) exacerbation: Secondary | ICD-10-CM | POA: Diagnosis not present

## 2022-08-12 DIAGNOSIS — S12091D Other nondisplaced fracture of first cervical vertebra, subsequent encounter for fracture with routine healing: Secondary | ICD-10-CM | POA: Diagnosis not present

## 2022-08-12 DIAGNOSIS — M4050 Lordosis, unspecified, site unspecified: Secondary | ICD-10-CM | POA: Diagnosis not present

## 2022-08-12 NOTE — Telephone Encounter (Signed)
Patient called requesting telephone call to discuss patient's most recent CT scan. Sent message to schedulers for phone visit today.

## 2022-08-15 DIAGNOSIS — S12091D Other nondisplaced fracture of first cervical vertebra, subsequent encounter for fracture with routine healing: Secondary | ICD-10-CM | POA: Diagnosis not present

## 2022-08-15 DIAGNOSIS — M4050 Lordosis, unspecified, site unspecified: Secondary | ICD-10-CM | POA: Diagnosis not present

## 2022-08-15 DIAGNOSIS — C3491 Malignant neoplasm of unspecified part of right bronchus or lung: Secondary | ICD-10-CM | POA: Diagnosis not present

## 2022-08-15 DIAGNOSIS — D630 Anemia in neoplastic disease: Secondary | ICD-10-CM | POA: Diagnosis not present

## 2022-08-15 DIAGNOSIS — J441 Chronic obstructive pulmonary disease with (acute) exacerbation: Secondary | ICD-10-CM | POA: Diagnosis not present

## 2022-08-15 DIAGNOSIS — J90 Pleural effusion, not elsewhere classified: Secondary | ICD-10-CM | POA: Diagnosis not present

## 2022-08-16 DIAGNOSIS — M4050 Lordosis, unspecified, site unspecified: Secondary | ICD-10-CM | POA: Diagnosis not present

## 2022-08-16 DIAGNOSIS — S12091D Other nondisplaced fracture of first cervical vertebra, subsequent encounter for fracture with routine healing: Secondary | ICD-10-CM | POA: Diagnosis not present

## 2022-08-16 DIAGNOSIS — J90 Pleural effusion, not elsewhere classified: Secondary | ICD-10-CM | POA: Diagnosis not present

## 2022-08-16 DIAGNOSIS — J441 Chronic obstructive pulmonary disease with (acute) exacerbation: Secondary | ICD-10-CM | POA: Diagnosis not present

## 2022-08-16 DIAGNOSIS — D630 Anemia in neoplastic disease: Secondary | ICD-10-CM | POA: Diagnosis not present

## 2022-08-16 DIAGNOSIS — C3491 Malignant neoplasm of unspecified part of right bronchus or lung: Secondary | ICD-10-CM | POA: Diagnosis not present

## 2022-08-17 DIAGNOSIS — S12091D Other nondisplaced fracture of first cervical vertebra, subsequent encounter for fracture with routine healing: Secondary | ICD-10-CM | POA: Diagnosis not present

## 2022-08-17 DIAGNOSIS — M4050 Lordosis, unspecified, site unspecified: Secondary | ICD-10-CM | POA: Diagnosis not present

## 2022-08-17 DIAGNOSIS — J90 Pleural effusion, not elsewhere classified: Secondary | ICD-10-CM | POA: Diagnosis not present

## 2022-08-17 DIAGNOSIS — C3491 Malignant neoplasm of unspecified part of right bronchus or lung: Secondary | ICD-10-CM | POA: Diagnosis not present

## 2022-08-17 DIAGNOSIS — J441 Chronic obstructive pulmonary disease with (acute) exacerbation: Secondary | ICD-10-CM | POA: Diagnosis not present

## 2022-08-17 DIAGNOSIS — D630 Anemia in neoplastic disease: Secondary | ICD-10-CM | POA: Diagnosis not present

## 2022-08-18 ENCOUNTER — Ambulatory Visit (HOSPITAL_COMMUNITY): Payer: Medicare Other

## 2022-08-21 ENCOUNTER — Encounter: Payer: Self-pay | Admitting: Internal Medicine

## 2022-08-21 NOTE — Progress Notes (Signed)
[place] [phone]   [phone]  PROGRESS NOTE  Patient Care Team: Mckinley Jewel, MD as PCP - General (Internal Medicine) Curt Bears, MD as Consulting Physician (Oncology)  Hematological/Oncological History # Small Cell Lung Cancer, Limited stage (T2b, N2, M0) 03/06/2017: completed Carbo/Etop with radiation x 6 cycles 03/05/2019: started Carbo/Etop/Atezo for recurrence with Dr. Julien Nordmann. 04/26/2021: transfer care to Dr. Lorenso Courier. Cycle 26 of atezolizumab.  05/18/2021: Cycle 27 of atezolizumab maintenance therapy. Last treatment she received. Wished to d/c immunotherapy.   Interval History:  Tammy Bowers 71 y.o. female with medical history significant for limited stage Small Cell Lung cancer who presents for a follow up telephone visit. The patient's last visit was on 09/13/2021. In the interim since the last visit she has missed/canceled numerous visits and was lost to follow up.   On exam today Tammy Bowers reports she had a fall recently at night and is unsure how it occurred.  She thinks it may have occurred in the dark when she is coming back from the restroom and "hit the dresser".  She notes that she is otherwise at her baseline level of health and continues to be a poor functional status.  She reports that she is aware of the results of the CT scan and does not wish to pursue further immunotherapy/chemotherapy treatment.  She denies fevers, chills, night sweat, chest pain, cough. She has no other complaints.  A full 10 point ROS is listed below.  The bulk of our discussion focused on the options moving forward including treatment for the mass versus comfort based care/palliative approach.  Patient voiced understanding of the plan moving forward and favored comfort based approach at this time.   MEDICAL HISTORY:  Past Medical History:  Diagnosis Date   Adenopathy 10/10/2016   PERICARINAL   Anemia    Arthritis    Asthma    COPD (chronic  obstructive pulmonary disease) (HCC)    Dyspnea    Headache    migraines   Hilar mass 10/10/2016   RIGHT   History of kidney stones    Insomnia    Lung nodules 10/10/2016   RIGHT LOWER LOBE   Mass of both adrenal glands ([place]) 10/10/2016   Mood swing    Pneumonia    SCL CA dx'd 2017   lung cancer Small Cell; recurrence 10/2018   Spinal headache    Substance abuse ([place])    TOBACCO   UNSPECIFIED INFECTION OF BONE ANKLE AND FOOT 10/22/2009   Annotation: left ankle Qualifier: Diagnosis of  By: Gustavo Lah      SURGICAL HISTORY: Past Surgical History:  Procedure Laterality Date   ANKLE SURGERY     hardware removal, and initial ankle surgery   APPENDECTOMY     IR IMAGING GUIDED PORT INSERTION  04/12/2019   LUNG BIOPSY N/A 10/19/2016   Procedure: LUNG BIOPSY;  Surgeon: Grace Isaac, MD;  Location: [facility];  Service: Thoracic;  Laterality: N/A;   LUNG BIOPSY N/A 02/20/2019   Procedure: LUNG BIOPSY;  Surgeon: Grace Isaac, MD;  Location: [facility];  Service: Thoracic;  Laterality: N/A;   TONSILLECTOMY     VIDEO BRONCHOSCOPY WITH ENDOBRONCHIAL ULTRASOUND N/A 10/19/2016   Procedure: VIDEO BRONCHOSCOPY WITH ENDOBRONCHIAL ULTRASOUND;  Surgeon: Grace Isaac, MD;  Location: [facility];  Service: Thoracic;  Laterality: N/A;   VIDEO BRONCHOSCOPY WITH ENDOBRONCHIAL ULTRASOUND N/A 02/20/2019   Procedure: VIDEO BRONCHOSCOPY WITH ENDOBRONCHIAL ULTRASOUND;  Surgeon: Grace Isaac, MD;  Location: [facility];  Service: Thoracic;  Laterality: N/A;    SOCIAL HISTORY: Social History   Socioeconomic History   Marital status: Married    Spouse name: Havah Ammon   Number of children: 2   Years of education: Not on file   Highest education level: Not on file  Occupational History   Occupation: retired  Tobacco Use   Smoking status: Every Day    Packs/day: 1.00    Years: 50.00    Total pack years: 50.00    Types: Cigarettes   Smokeless tobacco: Never   Tobacco comments:    Smokes 10 - 15  cigarettes a day ARJ 05/19/22  Vaping Use   Vaping Use: Never used  Substance and Sexual Activity   Alcohol use: Yes    Alcohol/week: 7.0 standard drinks of alcohol    Types: 7 Shots of liquor per week   Drug use: Not Currently    Types: Marijuana    Comment: August/Sept 2017 last ttime   Sexual activity: Yes    Partners: Male    Birth control/protection: Post-menopausal  Other Topics Concern   Not on file  Social History Narrative   Lives with husband Jermarcus Mcfadyen   Left Handed   Drinks 3-4 cups daily   Social Determinants of Health   Financial Resource Strain: Not on file  Food Insecurity: Not on file  Transportation Needs: Not on file  Physical Activity: Not on file  Stress: Not on file  Social Connections: Not on file  Intimate Partner Violence: Not on file    FAMILY HISTORY: Family History  Problem Relation Age of Onset   Heart disease Father    Colon cancer Father    Heart disease Paternal Grandfather    Ovarian cancer Mother     ALLERGIES:  is allergic to lidocaine.  MEDICATIONS:  Current Outpatient Medications  Medication Sig Dispense Refill   albuterol (PROVENTIL HFA;VENTOLIN HFA) 108 (90 BASE) MCG/ACT inhaler Inhale 2 puffs into the lungs every 4 (four) hours as needed for wheezing.     azithromycin (ZITHROMAX) 250 MG tablet 1 PO daily 3 each 0   cyclobenzaprine (FLEXERIL) 5 MG tablet Take 1 tablet by mouth 3 times/day as needed-between meals & bedtime.     diphenoxylate-atropine (LOMOTIL) 2.5-0.025 MG tablet Take 1 tablet by mouth 4 (four) times daily as needed for diarrhea or loose stools. 30 tablet 0   Fluticasone-Salmeterol (ADVAIR) 500-50 MCG/DOSE AEPB Inhale 1 puff into the lungs 2 (two) times daily.     levothyroxine (SYNTHROID) 125 MCG tablet Take 1 tablet (125 mcg total) by mouth daily before breakfast. 30 tablet 0   nystatin (MYCOSTATIN) 100000 UNIT/ML suspension Take 5 mLs (500,000 Units total) by mouth 4 (four) times daily. 60 mL 0   ondansetron  (ZOFRAN-ODT) 8 MG disintegrating tablet Take 1 tablet (8 mg total) by mouth every 8 (eight) hours as needed for nausea or vomiting. 90 tablet 0   OVER THE COUNTER MEDICATION 1 capsule. [place] tail-"mushroom in a capsule"      oxyCODONE-acetaminophen (PERCOCET/ROXICET) 5-325 MG tablet Take 1 tablet by mouth every 6 (six) hours as needed for severe pain. 15 tablet 0   prochlorperazine (COMPAZINE) 10 MG tablet TAKE 1 TABLET(10 MG) BY MOUTH EVERY 6 HOURS AS NEEDED FOR NAUSEA OR VOMITING (Patient taking differently: Take 10 mg by mouth every 6 (six) hours as needed for vomiting or nausea.) 30 tablet 0   topiramate (TOPAMAX) 50 MG tablet TAKE 1 TABLET(50 MG) BY MOUTH TWICE DAILY (  Patient taking differently: Take 50 mg by mouth 2 (two) times daily. TAKE 1 TABLET(50 MG) BY MOUTH TWICE DAILY) 180 tablet 1   varenicline (CHANTIX PAK) 0.5 MG X 11 & 1 MG X 42 tablet Take 2 tablets by mouth daily. (Patient not taking: Reported on 07/30/2022)     No current facility-administered medications for this visit.    REVIEW OF SYSTEMS:   Constitutional: ( - ) fevers, ( - )  chills , ( - ) night sweats Eyes: ( - ) blurriness of vision, ( - ) double vision, ( - ) watery eyes Ears, nose, mouth, throat, and face: ( - ) mucositis, ( - ) sore throat Respiratory: ( - ) cough, ( - ) dyspnea, ( - ) wheezes Cardiovascular: ( - ) palpitation, ( - ) chest discomfort, ( - ) lower extremity swelling Gastrointestinal:  ( + ) nausea, ( - ) heartburn, ( + ) change in bowel habits Skin: ( - ) abnormal skin rashes Lymphatics: ( - ) new lymphadenopathy, ( - ) easy bruising Neurological: ( - ) numbness, ( - ) tingling, ( - ) new weaknesses Behavioral/Psych: ( - ) mood change, ( - ) new changes  All other systems were reviewed with the patient and are negative.  PHYSICAL EXAMINATION: TELEPHONE VISIT   LABORATORY DATA:  I have reviewed the data as listed    Latest Ref Rng & Units 07/31/2022    4:15 AM 07/30/2022    7:04 PM 07/30/2022     3:35 PM  CBC  WBC 4.0 - 10.5 K/uL 10.0   8.5   Hemoglobin 12.0 - 15.0 g/dL 11.7  17.0  14.2   Hematocrit 36.0 - 46.0 % 35.0  50.0  41.6   Platelets 150 - 400 K/uL 375   407        Latest Ref Rng & Units 08/01/2022    5:00 AM 07/31/2022    4:15 AM 07/30/2022    7:04 PM  CMP  Glucose 70 - 99 mg/dL 95  118    BUN 8 - 23 mg/dL 12  17    Creatinine 0.44 - 1.00 mg/dL 1.00  1.12    Sodium 135 - 145 mmol/L 138  137  134   Potassium 3.5 - 5.1 mmol/L 3.4  3.3  3.8   Chloride 98 - 111 mmol/L 108  106    CO2 22 - 32 mmol/L 23  23    Calcium 8.9 - 10.3 mg/dL 8.2  8.2      RADIOGRAPHIC STUDIES: ECHOCARDIOGRAM COMPLETE  Result Date: 07/31/2022    ECHOCARDIOGRAM REPORT   Patient Name:   Tammy Bowers Date of Exam: 07/31/2022 Medical Rec #:  [id number]   Height:       66.0 in Accession #:    [id number]  Weight:       175.2 lb Date of Birth:  October 05, 1951    BSA:          1.891 m Patient Age:    16 years    BP:           146/96 mmHg Patient Gender: F           HR:           90 bpm. Exam Location:  Inpatient Procedure: 2D Echo Indications:    pulmonary hypertension  History:        Patient has no prior history of Echocardiogram examinations.  COPD and Cancer, Arrythmias:Atrial Fibrillation,                 Signs/Symptoms:Shortness of Breath; Risk Factors:Current Smoker.  Sonographer:    Johny Chess RDCS Referring Phys: [id number] Lequita Halt  Sonographer Comments: Technically difficult study due to poor echo windows. Image acquisition challenging due to respiratory motion. IMPRESSIONS  1. Left ventricular ejection fraction, by estimation, is 60 to 65%. The left ventricle has normal function. Left ventricular endocardial border not optimally defined to evaluate regional wall motion. Left ventricular diastolic parameters are indeterminate.  2. Right ventricular systolic function is normal. The right ventricular size is normal. Tricuspid regurgitation signal is inadequate for assessing PA pressure.   3. A small pericardial effusion is present. The pericardial effusion is anterior to the right ventricle. There is no evidence of cardiac tamponade.  4. The mitral valve is normal in structure. No evidence of mitral valve regurgitation. No evidence of mitral stenosis.  5. The aortic valve is tricuspid. Aortic valve regurgitation is not visualized. No aortic stenosis is present.  6. Technically difficult study- suspect related to lung windows and COPD. Comparison(s): No prior Echocardiogram. FINDINGS  Left Ventricle: Left ventricular ejection fraction, by estimation, is 60 to 65%. The left ventricle has normal function. Left ventricular endocardial border not optimally defined to evaluate regional wall motion. The left ventricular internal cavity size was normal in size. There is no left ventricular hypertrophy. Left ventricular diastolic parameters are indeterminate. Right Ventricle: The right ventricular size is normal. No increase in right ventricular wall thickness. Right ventricular systolic function is normal. Tricuspid regurgitation signal is inadequate for assessing PA pressure. Left Atrium: Left atrial size was normal in size. Right Atrium: Right atrial size was normal in size. Pericardium: A small pericardial effusion is present. The pericardial effusion is anterior to the right ventricle. There is no evidence of cardiac tamponade. Mitral Valve: The mitral valve is normal in structure. No evidence of mitral valve regurgitation. No evidence of mitral valve stenosis. Tricuspid Valve: The tricuspid valve is grossly normal. Tricuspid valve regurgitation is not demonstrated. Aortic Valve: The aortic valve is tricuspid. Aortic valve regurgitation is not visualized. No aortic stenosis is present. Pulmonic Valve: The pulmonic valve was normal in structure. Pulmonic valve regurgitation is not visualized. No evidence of pulmonic stenosis. Aorta: The aortic root, ascending aorta and aortic arch are all structurally  normal, with no evidence of dilitation or obstruction. IAS/Shunts: No atrial level shunt detected by color flow Doppler.  LEFT VENTRICLE PLAX 2D LVIDd:         4.50 cm LVIDs:         3.00 cm LV PW:         0.90 cm LV IVS:        0.90 cm LVOT diam:     2.10 cm LV SV:         50 LV SV Index:   27 LVOT Area:     3.46 cm  RIGHT VENTRICLE            IVC RV S prime:     9.79 cm/s  IVC diam: 1.80 cm TAPSE (M-mode): 1.3 cm LEFT ATRIUM             Index LA diam:        3.00 cm 1.59 cm/m LA Vol (A2C):   21.3 ml 11.27 ml/m LA Vol (A4C):   22.2 ml 11.74 ml/m LA Biplane Vol: 21.4 ml 11.32 ml/m  AORTIC VALVE LVOT  Vmax:   75.10 cm/s LVOT Vmean:  49.400 cm/s LVOT VTI:    0.145 m  AORTA Ao Root diam: 3.20 cm MITRAL VALVE MV Area (PHT): 4.15 cm    SHUNTS MV Decel Time: 183 msec    Systemic VTI:  0.14 m MV E velocity: 60.20 cm/s  Systemic Diam: 2.10 cm MV A velocity: 65.60 cm/s MV E/A ratio:  0.92 Rudean Haskell MD Electronically signed by Rudean Haskell MD Signature Date/Time: 07/31/2022/5:13:04 PM    Final    CT Chest Wo Contrast  Result Date: 07/30/2022 CLINICAL DATA:  Pneumonia. EXAM: CT CHEST WITHOUT CONTRAST TECHNIQUE: Multidetector CT imaging of the chest was performed following the standard protocol without IV contrast. RADIATION DOSE REDUCTION: This exam was performed according to the departmental dose-optimization program which includes automated exposure control, adjustment of the mA and/or kV according to patient size and/or use of iterative reconstruction technique. COMPARISON:  Chest x-ray same day. CT chest abdomen and pelvis 12/28/2021. FINDINGS: Cardiovascular: Heart and aorta are normal in size. There is no pericardial effusion. There are atherosclerotic calcifications of the aorta and coronary arteries. Right chest port catheter tip ends in the distal SVC. Again seen is narrowing of the superior vena cava as it approximates the right atrium, unchanged. Mediastinum/Nodes: Enlargement of right  hilar and perihilar mass identified in the interval. Discrete margins are difficult to measure secondary to lack of contrast but are proximally 4.6 x 4.9 by 4.9 cm. This extends into the subcarinal space. Calcified subcarinal and right hilar lymph nodes are again seen. The esophagus is nondilated. The thyroid gland is not well seen. No other separate discrete enlarged lymph nodes are identified. Lungs/Pleura: There is new cutoff of the distal right mainstem bronchus. There is subsequent collapse of the right middle lobe and right lower lobe. There are scattered tree-in-bud opacities throughout the mid and inferior right lung. There is a new 8 mm nodule in the inferior right lung image 2/81. There is a new nodular density measuring 1 cm in the right apex image 4/45. New bilobed nodule with larger component measuring 4 mm in the left upper lobe image 4/15. There is new volume loss on the right with a new small right pleural effusion. Upper Abdomen: Right adrenal nodule measuring up to 3.6 cm appears unchanged. There are punctate left renal calculi without hydronephrosis. Musculoskeletal: No chest wall mass or suspicious bone lesions identified. IMPRESSION: 1. Progression/enlargement of right hilar mass worrisome for disease recurrence/neoplasm. There is new cutoff of the distal right mainstem bronchus with collapse of the right middle lobe and right lower lobe. 2. New small right pleural effusion. 3. Scattered tree-in-bud opacities throughout the right lung may be infectious/inflammatory or neoplastic. 4. There are few new scattered nodular densities in the right lung and left lung apex. Metastatic disease not excluded. 5. Unchanged right adrenal nodule. Electronically Signed   By: Ronney Asters M.D.   On: 07/30/2022 18:29   CT Head Wo Contrast  Result Date: 07/30/2022 CLINICAL DATA:  History of cervical spine fracture common noncompliant EXAM: CT HEAD WITHOUT CONTRAST CT CERVICAL SPINE WITHOUT CONTRAST TECHNIQUE:  Multidetector CT imaging of the head and cervical spine was performed following the standard protocol without intravenous contrast. Multiplanar CT image reconstructions of the cervical spine were also generated. RADIATION DOSE REDUCTION: This exam was performed according to the departmental dose-optimization program which includes automated exposure control, adjustment of the mA and/or kV according to patient size and/or use of iterative reconstruction technique. COMPARISON:  CT  August 4 ,2023. FINDINGS: CT HEAD FINDINGS Brain: Similar appearance of the extensive subcortical and periventricular white matter hypodensities. No evidence of acute infarction, hemorrhage, hydrocephalus, extra-axial collection or mass lesion/mass effect. Vascular: No hyperdense vessel. Atherosclerotic calcifications of the internal and vertebral arteries at the skull base. Skull: Normal. Negative for fracture or focal lesion. Sinuses/Orbits: Visualized portions of the paranasal sinuses are predominantly clear. Orbits are grossly unremarkable. Other: Mastoid air cells are predominantly clear. CT CERVICAL SPINE FINDINGS Alignment: Slight straightening of the normal cervical lordosis. Mild C1 on C2 retrolisthesis is unchanged from prior. Skull base and vertebrae: Slightly comminuted type 2 dens fracture with 4 mm of retrolisthesis is unchanged from prior. Similar appearance of the bilateral nondisplaced C1 posterior arch fractures. Soft tissues and spinal canal: Mild prevertebral swelling at C1-C2 is unchanged from prior. No visible canal hematoma. Disc levels: Multilevel degenerative changes spine unchanged from prior. Upper chest: Biapical pleuroparenchymal scarring. Other: None IMPRESSION: 1. Similar appearance of the type 2 dens fracture with 4 mm of retrolisthesis. 2. No significant interval change in the bilateral nondisplaced fractures of the C1 posterior arch. 3. No acute intracranial abnormality. 4. Similar extensive periventricular  and subcortical white matter hypodensities which may be associated with known radiation. Electronically Signed   By: Dahlia Bailiff M.D.   On: 07/30/2022 16:47   CT Cervical Spine Wo Contrast  Result Date: 07/30/2022 CLINICAL DATA:  History of cervical spine fracture common noncompliant EXAM: CT HEAD WITHOUT CONTRAST CT CERVICAL SPINE WITHOUT CONTRAST TECHNIQUE: Multidetector CT imaging of the head and cervical spine was performed following the standard protocol without intravenous contrast. Multiplanar CT image reconstructions of the cervical spine were also generated. RADIATION DOSE REDUCTION: This exam was performed according to the departmental dose-optimization program which includes automated exposure control, adjustment of the mA and/or kV according to patient size and/or use of iterative reconstruction technique. COMPARISON:  CT August 4 ,2023. FINDINGS: CT HEAD FINDINGS Brain: Similar appearance of the extensive subcortical and periventricular white matter hypodensities. No evidence of acute infarction, hemorrhage, hydrocephalus, extra-axial collection or mass lesion/mass effect. Vascular: No hyperdense vessel. Atherosclerotic calcifications of the internal and vertebral arteries at the skull base. Skull: Normal. Negative for fracture or focal lesion. Sinuses/Orbits: Visualized portions of the paranasal sinuses are predominantly clear. Orbits are grossly unremarkable. Other: Mastoid air cells are predominantly clear. CT CERVICAL SPINE FINDINGS Alignment: Slight straightening of the normal cervical lordosis. Mild C1 on C2 retrolisthesis is unchanged from prior. Skull base and vertebrae: Slightly comminuted type 2 dens fracture with 4 mm of retrolisthesis is unchanged from prior. Similar appearance of the bilateral nondisplaced C1 posterior arch fractures. Soft tissues and spinal canal: Mild prevertebral swelling at C1-C2 is unchanged from prior. No visible canal hematoma. Disc levels: Multilevel  degenerative changes spine unchanged from prior. Upper chest: Biapical pleuroparenchymal scarring. Other: None IMPRESSION: 1. Similar appearance of the type 2 dens fracture with 4 mm of retrolisthesis. 2. No significant interval change in the bilateral nondisplaced fractures of the C1 posterior arch. 3. No acute intracranial abnormality. 4. Similar extensive periventricular and subcortical white matter hypodensities which may be associated with known radiation. Electronically Signed   By: Dahlia Bailiff M.D.   On: 07/30/2022 16:47   DG Chest Port 1 View  Result Date: 07/30/2022 CLINICAL DATA:  Altered mental status. Increased lethargy and weakness. EXAM: PORTABLE CHEST 1 VIEW COMPARISON:  CT December 28, 2021. FINDINGS: Patient is rotated. Right chest Port-A-Cath with tip at the superior cavoatrial junction. Displacement of  the mediastinum into the right hemithorax is similar to prior and at least somewhat accentuated by technique. Aortic atherosclerosis. Posttreatment change in the right lung. Small right pleural effusion with adjacent atelectasis/infiltrate. No acute osseous abnormality. IMPRESSION: Small right pleural effusion with adjacent atelectasis/infiltrate. Posttreatment change in the right lung. Left lung is clear. Electronically Signed   By: Dahlia Bailiff M.D.   On: 07/30/2022 16:24     ASSESSMENT & PLAN Tammy Bowers 71 y.o. female with medical history significant for limited stage Small Cell Lung cancer who presents for a follow up visit.  #Small Cell Lung Cancer, Recurrent Limited Stage --Patient elected to discontinue treatment at last visit with Dr. Lorenso Courier on 09/13/2022. Patient has missed/cancelled visits and was lost to follow up afterwards.  --Patient returns to re-establish care. Labs today were reviewed and require no intervention. --Last CT C/A/P on 9/23/222 showed stable disease with no clear evidence of progression --MRI brain last on 04/21/2022 shows no evidence of metastatic  disease  --Recommend to undergo repeat CT imaging to evaluate her cancer. Plan to schedule in the next 2-3 weeks.  --RTC for labs and follow up with Dr. Lorenso Courier to review CT scan results. Patient will decide if she wants to pursue treatment at that time. If she decides not to pursue any treatment, she would like to remove her port.   #Nausea: --Secondary to pain medication --Sent zofran ODT 8 mg q 8 hours PRN  #Oral Thrush: --Sent nystatin mouthwash  Orders Placed This Encounter  Procedures   NM PET Image Restag (PS) Skull Base To Thigh    Standing Status:   Future    Standing Expiration Date:   08/12/2023    Order Specific Question:   If indicated for the ordered procedure, I authorize the administration of a radiopharmaceutical per Radiology protocol    Answer:   Yes    Order Specific Question:   Preferred imaging location?    Answer:   [facility]    All questions were answered. The patient knows to call the clinic with any problems, questions or concerns.  I have spent a total of 25 minutes minutes of face-to-face and non-face-to-face time, preparing to see the patient,  performing a medically appropriate examination, counseling and educating the patient, ordering tests/procedures, documenting clinical information in the electronic health record,  and care coordination.   Ledell Peoples, MD Department of Hematology/Oncology [place] [facility] Phone: [phone] Pager: [phone] Email: [email or web address]    08/21/2022 8:23 PM

## 2022-08-22 DIAGNOSIS — J90 Pleural effusion, not elsewhere classified: Secondary | ICD-10-CM | POA: Diagnosis not present

## 2022-08-22 DIAGNOSIS — D630 Anemia in neoplastic disease: Secondary | ICD-10-CM | POA: Diagnosis not present

## 2022-08-22 DIAGNOSIS — J441 Chronic obstructive pulmonary disease with (acute) exacerbation: Secondary | ICD-10-CM | POA: Diagnosis not present

## 2022-08-22 DIAGNOSIS — C3491 Malignant neoplasm of unspecified part of right bronchus or lung: Secondary | ICD-10-CM | POA: Diagnosis not present

## 2022-08-22 DIAGNOSIS — S12091D Other nondisplaced fracture of first cervical vertebra, subsequent encounter for fracture with routine healing: Secondary | ICD-10-CM | POA: Diagnosis not present

## 2022-08-22 DIAGNOSIS — M4050 Lordosis, unspecified, site unspecified: Secondary | ICD-10-CM | POA: Diagnosis not present

## 2022-08-23 ENCOUNTER — Telehealth: Payer: Self-pay

## 2022-08-23 DIAGNOSIS — J90 Pleural effusion, not elsewhere classified: Secondary | ICD-10-CM | POA: Diagnosis not present

## 2022-08-23 DIAGNOSIS — S12091D Other nondisplaced fracture of first cervical vertebra, subsequent encounter for fracture with routine healing: Secondary | ICD-10-CM | POA: Diagnosis not present

## 2022-08-23 DIAGNOSIS — C3491 Malignant neoplasm of unspecified part of right bronchus or lung: Secondary | ICD-10-CM | POA: Diagnosis not present

## 2022-08-23 DIAGNOSIS — J441 Chronic obstructive pulmonary disease with (acute) exacerbation: Secondary | ICD-10-CM | POA: Diagnosis not present

## 2022-08-23 DIAGNOSIS — S12031G Nondisplaced posterior arch fracture of first cervical vertebra, subsequent encounter for fracture with delayed healing: Secondary | ICD-10-CM | POA: Diagnosis not present

## 2022-08-23 DIAGNOSIS — S12111G Posterior displaced Type II dens fracture, subsequent encounter for fracture with delayed healing: Secondary | ICD-10-CM | POA: Diagnosis not present

## 2022-08-23 DIAGNOSIS — M4050 Lordosis, unspecified, site unspecified: Secondary | ICD-10-CM | POA: Diagnosis not present

## 2022-08-23 DIAGNOSIS — D630 Anemia in neoplastic disease: Secondary | ICD-10-CM | POA: Diagnosis not present

## 2022-08-23 NOTE — Telephone Encounter (Signed)
Husband called to say that patient is scheduled to have surgery in October for her "broken neck" that did not heal properly. Husband and pt are planning a vacation in September. He asked if we could give him a definitive time frame for end of life. Stated that we could not do that, patient does not want diagnostics done and that each patient is very different. We discussed hospice vs palliative care. Husband would like to proceed with Hospice referral.  Called Merleen Milliner RN at [facility] to initiate referral.

## 2022-08-24 ENCOUNTER — Telehealth: Payer: Self-pay | Admitting: *Deleted

## 2022-08-24 DIAGNOSIS — J441 Chronic obstructive pulmonary disease with (acute) exacerbation: Secondary | ICD-10-CM | POA: Diagnosis not present

## 2022-08-24 DIAGNOSIS — D630 Anemia in neoplastic disease: Secondary | ICD-10-CM | POA: Diagnosis not present

## 2022-08-24 DIAGNOSIS — M4050 Lordosis, unspecified, site unspecified: Secondary | ICD-10-CM | POA: Diagnosis not present

## 2022-08-24 DIAGNOSIS — J90 Pleural effusion, not elsewhere classified: Secondary | ICD-10-CM | POA: Diagnosis not present

## 2022-08-24 DIAGNOSIS — S12091D Other nondisplaced fracture of first cervical vertebra, subsequent encounter for fracture with routine healing: Secondary | ICD-10-CM | POA: Diagnosis not present

## 2022-08-24 DIAGNOSIS — C3491 Malignant neoplasm of unspecified part of right bronchus or lung: Secondary | ICD-10-CM | POA: Diagnosis not present

## 2022-08-24 NOTE — Telephone Encounter (Signed)
Received call from Margaretmary Eddy, RN with [facility]. A referral had been made yesterday for hospice services. [facility] contacted husband and he wants to wait until October to start Hospice as he and his wife will be traveling. Olivia Mackie states that the husband will contact them when they return from their travels.  Dr. Lorenso Courier made aware.

## 2022-08-26 DIAGNOSIS — C3491 Malignant neoplasm of unspecified part of right bronchus or lung: Secondary | ICD-10-CM | POA: Diagnosis not present

## 2022-08-26 DIAGNOSIS — J90 Pleural effusion, not elsewhere classified: Secondary | ICD-10-CM | POA: Diagnosis not present

## 2022-08-26 DIAGNOSIS — J441 Chronic obstructive pulmonary disease with (acute) exacerbation: Secondary | ICD-10-CM | POA: Diagnosis not present

## 2022-08-26 DIAGNOSIS — D630 Anemia in neoplastic disease: Secondary | ICD-10-CM | POA: Diagnosis not present

## 2022-08-26 DIAGNOSIS — M4050 Lordosis, unspecified, site unspecified: Secondary | ICD-10-CM | POA: Diagnosis not present

## 2022-08-26 DIAGNOSIS — S12091D Other nondisplaced fracture of first cervical vertebra, subsequent encounter for fracture with routine healing: Secondary | ICD-10-CM | POA: Diagnosis not present

## 2022-08-29 ENCOUNTER — Other Ambulatory Visit: Payer: Self-pay | Admitting: Neurosurgery

## 2022-08-29 DIAGNOSIS — J90 Pleural effusion, not elsewhere classified: Secondary | ICD-10-CM | POA: Diagnosis not present

## 2022-08-29 DIAGNOSIS — J441 Chronic obstructive pulmonary disease with (acute) exacerbation: Secondary | ICD-10-CM | POA: Diagnosis not present

## 2022-08-29 DIAGNOSIS — C3491 Malignant neoplasm of unspecified part of right bronchus or lung: Secondary | ICD-10-CM | POA: Diagnosis not present

## 2022-08-29 DIAGNOSIS — S12091D Other nondisplaced fracture of first cervical vertebra, subsequent encounter for fracture with routine healing: Secondary | ICD-10-CM | POA: Diagnosis not present

## 2022-08-29 DIAGNOSIS — D630 Anemia in neoplastic disease: Secondary | ICD-10-CM | POA: Diagnosis not present

## 2022-08-29 DIAGNOSIS — M4050 Lordosis, unspecified, site unspecified: Secondary | ICD-10-CM | POA: Diagnosis not present

## 2022-08-30 ENCOUNTER — Inpatient Hospital Stay: Payer: Medicare Other

## 2022-08-30 ENCOUNTER — Other Ambulatory Visit: Payer: Medicare Other

## 2022-08-30 ENCOUNTER — Inpatient Hospital Stay: Payer: Medicare Other | Attending: Physician Assistant | Admitting: Hematology and Oncology

## 2022-08-31 DIAGNOSIS — J441 Chronic obstructive pulmonary disease with (acute) exacerbation: Secondary | ICD-10-CM | POA: Diagnosis not present

## 2022-08-31 DIAGNOSIS — J90 Pleural effusion, not elsewhere classified: Secondary | ICD-10-CM | POA: Diagnosis not present

## 2022-08-31 DIAGNOSIS — S12091D Other nondisplaced fracture of first cervical vertebra, subsequent encounter for fracture with routine healing: Secondary | ICD-10-CM | POA: Diagnosis not present

## 2022-08-31 DIAGNOSIS — M4050 Lordosis, unspecified, site unspecified: Secondary | ICD-10-CM | POA: Diagnosis not present

## 2022-08-31 DIAGNOSIS — D630 Anemia in neoplastic disease: Secondary | ICD-10-CM | POA: Diagnosis not present

## 2022-08-31 DIAGNOSIS — C3491 Malignant neoplasm of unspecified part of right bronchus or lung: Secondary | ICD-10-CM | POA: Diagnosis not present

## 2022-09-03 DIAGNOSIS — J9811 Atelectasis: Secondary | ICD-10-CM | POA: Diagnosis not present

## 2022-09-03 DIAGNOSIS — S12091D Other nondisplaced fracture of first cervical vertebra, subsequent encounter for fracture with routine healing: Secondary | ICD-10-CM | POA: Diagnosis not present

## 2022-09-03 DIAGNOSIS — Z8701 Personal history of pneumonia (recurrent): Secondary | ICD-10-CM | POA: Diagnosis not present

## 2022-09-03 DIAGNOSIS — Z9181 History of falling: Secondary | ICD-10-CM | POA: Diagnosis not present

## 2022-09-03 DIAGNOSIS — Z87442 Personal history of urinary calculi: Secondary | ICD-10-CM | POA: Diagnosis not present

## 2022-09-03 DIAGNOSIS — M4050 Lordosis, unspecified, site unspecified: Secondary | ICD-10-CM | POA: Diagnosis not present

## 2022-09-03 DIAGNOSIS — J441 Chronic obstructive pulmonary disease with (acute) exacerbation: Secondary | ICD-10-CM | POA: Diagnosis not present

## 2022-09-03 DIAGNOSIS — D630 Anemia in neoplastic disease: Secondary | ICD-10-CM | POA: Diagnosis not present

## 2022-09-03 DIAGNOSIS — Z7952 Long term (current) use of systemic steroids: Secondary | ICD-10-CM | POA: Diagnosis not present

## 2022-09-03 DIAGNOSIS — G43909 Migraine, unspecified, not intractable, without status migrainosus: Secondary | ICD-10-CM | POA: Diagnosis not present

## 2022-09-03 DIAGNOSIS — E669 Obesity, unspecified: Secondary | ICD-10-CM | POA: Diagnosis not present

## 2022-09-03 DIAGNOSIS — S5002XD Contusion of left elbow, subsequent encounter: Secondary | ICD-10-CM | POA: Diagnosis not present

## 2022-09-03 DIAGNOSIS — Z7951 Long term (current) use of inhaled steroids: Secondary | ICD-10-CM | POA: Diagnosis not present

## 2022-09-03 DIAGNOSIS — M4312 Spondylolisthesis, cervical region: Secondary | ICD-10-CM | POA: Diagnosis not present

## 2022-09-03 DIAGNOSIS — E039 Hypothyroidism, unspecified: Secondary | ICD-10-CM | POA: Diagnosis not present

## 2022-09-03 DIAGNOSIS — F17210 Nicotine dependence, cigarettes, uncomplicated: Secondary | ICD-10-CM | POA: Diagnosis not present

## 2022-09-03 DIAGNOSIS — Z85118 Personal history of other malignant neoplasm of bronchus and lung: Secondary | ICD-10-CM | POA: Diagnosis not present

## 2022-09-03 DIAGNOSIS — S60011D Contusion of right thumb without damage to nail, subsequent encounter: Secondary | ICD-10-CM | POA: Diagnosis not present

## 2022-09-03 DIAGNOSIS — C3491 Malignant neoplasm of unspecified part of right bronchus or lung: Secondary | ICD-10-CM | POA: Diagnosis not present

## 2022-09-03 DIAGNOSIS — S0083XD Contusion of other part of head, subsequent encounter: Secondary | ICD-10-CM | POA: Diagnosis not present

## 2022-09-03 DIAGNOSIS — J90 Pleural effusion, not elsewhere classified: Secondary | ICD-10-CM | POA: Diagnosis not present

## 2022-09-03 DIAGNOSIS — Z6828 Body mass index (BMI) 28.0-28.9, adult: Secondary | ICD-10-CM | POA: Diagnosis not present

## 2022-09-03 DIAGNOSIS — G4700 Insomnia, unspecified: Secondary | ICD-10-CM | POA: Diagnosis not present

## 2022-09-05 DIAGNOSIS — M4050 Lordosis, unspecified, site unspecified: Secondary | ICD-10-CM | POA: Diagnosis not present

## 2022-09-05 DIAGNOSIS — J90 Pleural effusion, not elsewhere classified: Secondary | ICD-10-CM | POA: Diagnosis not present

## 2022-09-05 DIAGNOSIS — J441 Chronic obstructive pulmonary disease with (acute) exacerbation: Secondary | ICD-10-CM | POA: Diagnosis not present

## 2022-09-05 DIAGNOSIS — S12091D Other nondisplaced fracture of first cervical vertebra, subsequent encounter for fracture with routine healing: Secondary | ICD-10-CM | POA: Diagnosis not present

## 2022-09-05 DIAGNOSIS — C3491 Malignant neoplasm of unspecified part of right bronchus or lung: Secondary | ICD-10-CM | POA: Diagnosis not present

## 2022-09-05 DIAGNOSIS — D630 Anemia in neoplastic disease: Secondary | ICD-10-CM | POA: Diagnosis not present

## 2022-09-06 ENCOUNTER — Telehealth: Payer: Self-pay | Admitting: *Deleted

## 2022-09-06 DIAGNOSIS — J441 Chronic obstructive pulmonary disease with (acute) exacerbation: Secondary | ICD-10-CM | POA: Diagnosis not present

## 2022-09-06 DIAGNOSIS — D630 Anemia in neoplastic disease: Secondary | ICD-10-CM | POA: Diagnosis not present

## 2022-09-06 DIAGNOSIS — M4050 Lordosis, unspecified, site unspecified: Secondary | ICD-10-CM | POA: Diagnosis not present

## 2022-09-06 DIAGNOSIS — S12091D Other nondisplaced fracture of first cervical vertebra, subsequent encounter for fracture with routine healing: Secondary | ICD-10-CM | POA: Diagnosis not present

## 2022-09-06 DIAGNOSIS — J90 Pleural effusion, not elsewhere classified: Secondary | ICD-10-CM | POA: Diagnosis not present

## 2022-09-06 DIAGNOSIS — C3491 Malignant neoplasm of unspecified part of right bronchus or lung: Secondary | ICD-10-CM | POA: Diagnosis not present

## 2022-09-06 NOTE — Telephone Encounter (Signed)
Stacy from [facility] states husband called to see if they can start seeing patient. Wants to know if Dr Lorenso Courier is OK with PC.

## 2022-09-06 NOTE — Telephone Encounter (Signed)
Notified [facility] that Dr Lorenso Courier is fine with [facility] seeing patient

## 2022-09-07 ENCOUNTER — Telehealth: Payer: Self-pay

## 2022-09-07 ENCOUNTER — Other Ambulatory Visit: Payer: Self-pay | Admitting: Neurosurgery

## 2022-09-07 DIAGNOSIS — S12091D Other nondisplaced fracture of first cervical vertebra, subsequent encounter for fracture with routine healing: Secondary | ICD-10-CM | POA: Diagnosis not present

## 2022-09-07 DIAGNOSIS — C3491 Malignant neoplasm of unspecified part of right bronchus or lung: Secondary | ICD-10-CM | POA: Diagnosis not present

## 2022-09-07 DIAGNOSIS — D630 Anemia in neoplastic disease: Secondary | ICD-10-CM | POA: Diagnosis not present

## 2022-09-07 DIAGNOSIS — J441 Chronic obstructive pulmonary disease with (acute) exacerbation: Secondary | ICD-10-CM | POA: Diagnosis not present

## 2022-09-07 DIAGNOSIS — M4050 Lordosis, unspecified, site unspecified: Secondary | ICD-10-CM | POA: Diagnosis not present

## 2022-09-07 DIAGNOSIS — J90 Pleural effusion, not elsewhere classified: Secondary | ICD-10-CM | POA: Diagnosis not present

## 2022-09-07 NOTE — Telephone Encounter (Signed)
(  4:10 pm) PC SW scheduled initial palliative care visit. Patient scheduled with RN/SW team for 09/20/22 at 12:30 pm.

## 2022-09-08 NOTE — Pre-Procedure Instructions (Signed)
Surgical Instructions    Your procedure is scheduled on September 22, 2022.  Report to [facility] Main Entrance "A" at 5:30 A.M., then check in with the Admitting office.  Call this number if you have problems the morning of surgery:  [phone]   If you have any questions prior to your surgery date call [phone]: Open Monday-Friday 8am-4pm    Remember:  Do not eat or drink after midnight the night before your surgery    Take these medicines the morning of surgery with A SIP OF WATER:  atorvastatin (LIPITOR)   Fluticasone-Salmeterol (ADVAIR)   levothyroxine (SYNTHROID)  topiramate (TOPAMAX)    Take these medicines the morning of surgery with a sip of water AS NEEDED:  acetaminophen (TYLENOL)  albuterol (PROVENTIL HFA;VENTOLIN HFA) inhaler  cyclobenzaprine (FLEXERIL)  ondansetron (ZOFRAN-ODT)   oxyCODONE-acetaminophen (PERCOCET)  prochlorperazine (COMPAZINE)   As of today, STOP taking any Aspirin (unless otherwise instructed by your surgeon) Aleve, Naproxen, Ibuprofen, Motrin, Advil, Goody's, BC's, all herbal medications, fish oil, and all vitamins.                     Do NOT Smoke (Tobacco/Vaping) for 24 hours prior to your procedure.  If you use a CPAP at night, you may bring your mask/headgear for your overnight stay.   Contacts, glasses, piercing's, hearing aid's, dentures or partials may not be worn into surgery, please bring cases for these belongings.    For patients admitted to the hospital, discharge time will be determined by your treatment team.   Patients discharged the day of surgery will not be allowed to drive home, and someone needs to stay with them for 24 hours.  SURGICAL WAITING ROOM VISITATION Patients having surgery or a procedure may have no more than 2 support people in the waiting area - these visitors may rotate.   Children under the age of 30 must have an adult with them who is not the patient. If the patient needs to stay at the hospital  during part of their recovery, the visitor guidelines for inpatient rooms apply. Pre-op nurse will coordinate an appropriate time for 1 support person to accompany patient in pre-op.  This support person may not rotate.   Please refer to the [facility] website for the visitor guidelines for Inpatients (after your surgery is over and you are in a regular room).    Special instructions:   [facility]- Preparing For Surgery  Before surgery, you can play an important role. Because skin is not sterile, your skin needs to be as free of germs as possible. You can reduce the number of germs on your skin by washing with CHG (chlorahexidine gluconate) Soap before surgery.  CHG is an antiseptic cleaner which kills germs and bonds with the skin to continue killing germs even after washing.    Oral Hygiene is also important to reduce your risk of infection.  Remember - BRUSH YOUR TEETH THE MORNING OF SURGERY WITH YOUR REGULAR TOOTHPASTE  Please do not use if you have an allergy to CHG or antibacterial soaps. If your skin becomes reddened/irritated stop using the CHG.  Do not shave (including legs and underarms) for at least 48 hours prior to first CHG shower. It is OK to shave your face.  Please follow these instructions carefully.   Shower the NIGHT BEFORE SURGERY and the MORNING OF SURGERY  If you chose to wash your hair, wash your hair first as usual with your normal shampoo.  After  you shampoo, rinse your hair and body thoroughly to remove the shampoo.  Use CHG Soap as you would any other liquid soap. You can apply CHG directly to the skin and wash gently with a scrungie or a clean washcloth.   Apply the CHG Soap to your body ONLY FROM THE NECK DOWN.  Do not use on open wounds or open sores. Avoid contact with your eyes, ears, mouth and genitals (private parts). Wash Face and genitals (private parts)  with your normal soap.   Wash thoroughly, paying special attention to the area where your surgery  will be performed.  Thoroughly rinse your body with warm water from the neck down.  DO NOT shower/wash with your normal soap after using and rinsing off the CHG Soap.  Pat yourself dry with a CLEAN TOWEL.  Wear CLEAN PAJAMAS to bed the night before surgery  Place CLEAN SHEETS on your bed the night before your surgery  DO NOT SLEEP WITH PETS.   Day of Surgery: Take a shower with CHG soap. Do not wear jewelry or makeup Do not wear lotions, powders, perfumes/colognes, or deodorant. Do not shave 48 hours prior to surgery.   Do not bring valuables to the hospital.  [facility] is not responsible for any belongings or valuables. Do not wear nail polish, gel polish, artificial nails, or any other type of covering on natural nails (fingers and toes) If you have artificial nails or gel coating that need to be removed by a nail salon, please have this removed prior to surgery. Artificial nails or gel coating may interfere with anesthesia's ability to adequately monitor your vital signs.  Wear Clean/Comfortable clothing the morning of surgery Remember to brush your teeth WITH YOUR REGULAR TOOTHPASTE.   Please read over the following fact sheets that you were given.    If you received a COVID test during your pre-op visit  it is requested that you wear a mask when out in public, stay away from anyone that may not be feeling well and notify your surgeon if you develop symptoms. If you have been in contact with anyone that has tested positive in the last 10 days please notify you surgeon.

## 2022-09-09 ENCOUNTER — Encounter (HOSPITAL_COMMUNITY): Payer: Self-pay

## 2022-09-09 ENCOUNTER — Encounter (HOSPITAL_COMMUNITY)
Admission: RE | Admit: 2022-09-09 | Discharge: 2022-09-09 | Disposition: A | Discharge status: Home / Self Care | Payer: Medicare Other | Source: Ambulatory Visit | Attending: Neurosurgery | Admitting: Neurosurgery

## 2022-09-09 ENCOUNTER — Other Ambulatory Visit: Payer: Self-pay

## 2022-09-09 VITALS — BP 111/91 | HR 106 | Temp 98.4°F | Resp 16 | Ht 66.0 in | Wt 152.0 lb

## 2022-09-09 DIAGNOSIS — Z01818 Encounter for other preprocedural examination: Secondary | ICD-10-CM | POA: Insufficient documentation

## 2022-09-09 DIAGNOSIS — I2510 Atherosclerotic heart disease of native coronary artery without angina pectoris: Secondary | ICD-10-CM | POA: Insufficient documentation

## 2022-09-09 HISTORY — DX: Depression, unspecified: F32.A

## 2022-09-09 HISTORY — DX: Hypothyroidism, unspecified: E03.9

## 2022-09-09 LAB — CBC
HCT: 40 % (ref 36.0–46.0)
Hemoglobin: 13.1 g/dL (ref 12.0–15.0)
MCH: 32 pg (ref 26.0–34.0)
MCHC: 32.8 g/dL (ref 30.0–36.0)
MCV: 97.8 fL (ref 80.0–100.0)
Platelets: 451 10*3/uL — ABNORMAL HIGH (ref 150–400)
RBC: 4.09 MIL/uL (ref 3.87–5.11)
RDW: 14.5 % (ref 11.5–15.5)
WBC: 8.7 10*3/uL (ref 4.0–10.5)
nRBC: 0 % (ref 0.0–0.2)

## 2022-09-09 LAB — SURGICAL PCR SCREEN
MRSA, PCR: NEGATIVE
Staphylococcus aureus: NEGATIVE

## 2022-09-09 LAB — BASIC METABOLIC PANEL
Anion gap: 8 (ref 5–15)
BUN: 21 mg/dL (ref 8–23)
CO2: 22 mmol/L (ref 22–32)
Calcium: 9.1 mg/dL (ref 8.9–10.3)
Chloride: 108 mmol/L (ref 98–111)
Creatinine, Ser: 1.07 mg/dL — ABNORMAL HIGH (ref 0.44–1.00)
GFR, Estimated: 56 mL/min — ABNORMAL LOW (ref >60)
Glucose, Bld: 130 mg/dL — ABNORMAL HIGH (ref 70–99)
Potassium: 3.1 mmol/L — ABNORMAL LOW (ref 3.5–5.1)
Sodium: 138 mmol/L (ref 135–145)

## 2022-09-09 LAB — TYPE AND SCREEN
ABO/RH(D): O POS
Antibody Screen: NEGATIVE

## 2022-09-09 NOTE — Progress Notes (Addendum)
PCP - Dr. Early Osmond Cardiologist - denies  PPM/ICD - denies   Chest x-ray - 07/30/22 EKG - 08/01/22 Stress Test - denies ECHO - 07/31/22 Cardiac Cath - denies  Sleep Study - denies  DM- denies  ASA/Blood Thinner Instructions: n/a   ERAS Protcol - no NPO   COVID TEST- n/a   Anesthesia review: no  Patient denies shortness of breath, fever, cough and chest pain at PAT appointment   All instructions explained to the patient, with a verbal understanding of the material. Patient agrees to go over the instructions while at home for a better understanding. The opportunity to ask questions was provided.   Pt unable to answer some questions. Husband answered most questions. Pt answered LOC questions correctly, just unsure of current month. Pt did have 2 emotional outbursts (crying, tearful) just when asking about medical history. Pt stated "she has so much wrong with her." Pt consoled, husband did not seem like this was anything "new."

## 2022-09-12 ENCOUNTER — Encounter: Payer: Self-pay | Admitting: Internal Medicine

## 2022-09-13 ENCOUNTER — Encounter: Payer: Self-pay | Admitting: Internal Medicine

## 2022-09-14 ENCOUNTER — Ambulatory Visit: Payer: Medicare Other | Admitting: Pulmonary Disease

## 2022-09-20 ENCOUNTER — Other Ambulatory Visit: Payer: Medicare Other

## 2022-09-20 ENCOUNTER — Other Ambulatory Visit: Payer: Medicare Other | Admitting: *Deleted

## 2022-09-20 VITALS — BP 138/82 | HR 101 | Temp 98.2°F | Resp 20

## 2022-09-20 DIAGNOSIS — Z515 Encounter for palliative care: Secondary | ICD-10-CM

## 2022-09-20 NOTE — Progress Notes (Signed)
[facility] SW NOTE  PATIENT NAME: Tammy Bowers DOB: [birth year] MRN: [id number]  PRIMARY CARE PROVIDER: Mckinley Jewel, MD  RESPONSIBLE PARTY:  Acct ID - Guarantor Home Phone Work Phone Relationship Acct Type  [id number] Laurice Record [id number]  Self P/F     [address], [place], [place] [address]   SOCIAL WORK VISIT/Homevisit  PC SW and RN-M. Howard completed visit with patient at her home. She was present with her husband. Patient was laying in her hospital bed watching TV. She reported generalized pain, but report ongoing pain/discomfort to her neck . Her husband administered her pain medication. She was cordial and engaged, but her voice is weak. Patient report increased shortness of breath with activity, increased fatigue and  decreased energy. Patient is scheduled to have neck surgery on Thursday. Patient is currently receiving physical therapy through [facility] 2x week, but her husband is not sure if this will continue or not following her surgery. Her husband shared that he serves as patient's PCG. He has a hired caregiver 1 time a week for 3 hours and his son also assist with caring for patient as needed. He expressed caregiver fatigue. SW provided education regarding personal care services. He provided permission for SW to facilitate application to these services. No other concern noted.  Patient and his wife remain open to ongoing palliative care visits and support. Team to follow-up as appropriate.  [address] [place] [address] [place], [place]

## 2022-09-21 NOTE — Anesthesia Preprocedure Evaluation (Signed)
Anesthesia Evaluation  Patient identified by MRN, date of birth, ID band Patient awake    Reviewed: Allergy & Precautions, H&P , NPO status , Patient's Chart, lab work & pertinent test results  Airway Mallampati: II  TM Distance: >3 FB Neck ROM: Full    Dental no notable dental hx. (+) Edentulous Upper, Edentulous Lower, Dental Advisory Given   Pulmonary asthma , COPD,  COPD inhaler, Current Smoker and Patient abstained from smoking.,    + rhonchi        Cardiovascular Exercise Tolerance: Good  Rhythm:Regular Rate:Normal     Neuro/Psych  Headaches, Depression    GI/Hepatic negative GI ROS, Neg liver ROS,   Endo/Other  Hypothyroidism   Renal/GU negative Renal ROS  negative genitourinary   Musculoskeletal  (+) Arthritis , Osteoarthritis,    Abdominal   Peds  Hematology negative hematology ROS (+)   Anesthesia Other Findings   Reproductive/Obstetrics negative OB ROS                            Anesthesia Physical Anesthesia Plan  ASA: 2  Anesthesia Plan: General   Post-op Pain Management: Tylenol PO (pre-op)*   Induction: Intravenous  PONV Risk Score and Plan: 3 and Ondansetron, Dexamethasone and Treatment may vary due to age or medical condition  Airway Management Planned: Oral ETT  Additional Equipment: Arterial line  Intra-op Plan:   Post-operative Plan: Extubation in OR  Informed Consent: I have reviewed the patients History and Physical, chart, labs and discussed the procedure including the risks, benefits and alternatives for the proposed anesthesia with the patient or authorized representative who has indicated his/her understanding and acceptance.     Dental advisory given  Plan Discussed with: CRNA  Anesthesia Plan Comments:        Anesthesia Quick Evaluation

## 2022-09-22 ENCOUNTER — Inpatient Hospital Stay (HOSPITAL_COMMUNITY): Payer: Medicare Other

## 2022-09-22 ENCOUNTER — Other Ambulatory Visit: Payer: Self-pay

## 2022-09-22 ENCOUNTER — Inpatient Hospital Stay (HOSPITAL_COMMUNITY)
Admission: RE | Disposition: A | Discharge status: Hospice | Payer: Self-pay | Source: Home / Self Care | Attending: Neurosurgery

## 2022-09-22 ENCOUNTER — Inpatient Hospital Stay (HOSPITAL_COMMUNITY): Payer: Medicare Other | Admitting: Certified Registered"

## 2022-09-22 ENCOUNTER — Inpatient Hospital Stay (HOSPITAL_COMMUNITY)
Admission: RE | Admit: 2022-09-22 | Discharge: 2022-09-26 | DRG: 471 | Disposition: A | Discharge status: Hospice | Payer: Medicare Other | Attending: Obstetrics and Gynecology | Admitting: Obstetrics and Gynecology

## 2022-09-22 DIAGNOSIS — E039 Hypothyroidism, unspecified: Secondary | ICD-10-CM | POA: Diagnosis present

## 2022-09-22 DIAGNOSIS — Z9911 Dependence on respirator [ventilator] status: Secondary | ICD-10-CM | POA: Diagnosis not present

## 2022-09-22 DIAGNOSIS — Z8249 Family history of ischemic heart disease and other diseases of the circulatory system: Secondary | ICD-10-CM

## 2022-09-22 DIAGNOSIS — S12111A Posterior displaced Type II dens fracture, initial encounter for closed fracture: Principal | ICD-10-CM | POA: Diagnosis present

## 2022-09-22 DIAGNOSIS — Z79899 Other long term (current) drug therapy: Secondary | ICD-10-CM

## 2022-09-22 DIAGNOSIS — J9601 Acute respiratory failure with hypoxia: Secondary | ICD-10-CM | POA: Diagnosis not present

## 2022-09-22 DIAGNOSIS — Z91199 Patient's noncompliance with other medical treatment and regimen due to unspecified reason: Secondary | ICD-10-CM | POA: Diagnosis not present

## 2022-09-22 DIAGNOSIS — Z923 Personal history of irradiation: Secondary | ICD-10-CM | POA: Diagnosis not present

## 2022-09-22 DIAGNOSIS — C3490 Malignant neoplasm of unspecified part of unspecified bronchus or lung: Secondary | ICD-10-CM | POA: Diagnosis present

## 2022-09-22 DIAGNOSIS — S12100D Unspecified displaced fracture of second cervical vertebra, subsequent encounter for fracture with routine healing: Secondary | ICD-10-CM | POA: Diagnosis not present

## 2022-09-22 DIAGNOSIS — Z515 Encounter for palliative care: Secondary | ICD-10-CM

## 2022-09-22 DIAGNOSIS — Z8041 Family history of malignant neoplasm of ovary: Secondary | ICD-10-CM

## 2022-09-22 DIAGNOSIS — R5381 Other malaise: Secondary | ICD-10-CM | POA: Diagnosis not present

## 2022-09-22 DIAGNOSIS — N200 Calculus of kidney: Secondary | ICD-10-CM | POA: Diagnosis not present

## 2022-09-22 DIAGNOSIS — Z452 Encounter for adjustment and management of vascular access device: Secondary | ICD-10-CM | POA: Diagnosis not present

## 2022-09-22 DIAGNOSIS — W1830XA Fall on same level, unspecified, initial encounter: Secondary | ICD-10-CM | POA: Diagnosis present

## 2022-09-22 DIAGNOSIS — T884XXD Failed or difficult intubation, subsequent encounter: Secondary | ICD-10-CM | POA: Diagnosis not present

## 2022-09-22 DIAGNOSIS — E876 Hypokalemia: Secondary | ICD-10-CM | POA: Diagnosis not present

## 2022-09-22 DIAGNOSIS — I471 Supraventricular tachycardia, unspecified: Secondary | ICD-10-CM | POA: Diagnosis not present

## 2022-09-22 DIAGNOSIS — S12100A Unspecified displaced fracture of second cervical vertebra, initial encounter for closed fracture: Secondary | ICD-10-CM

## 2022-09-22 DIAGNOSIS — Z6825 Body mass index (BMI) 25.0-25.9, adult: Secondary | ICD-10-CM | POA: Diagnosis not present

## 2022-09-22 DIAGNOSIS — Z981 Arthrodesis status: Secondary | ICD-10-CM

## 2022-09-22 DIAGNOSIS — Z800 Family history of malignant neoplasm of digestive organs: Secondary | ICD-10-CM

## 2022-09-22 DIAGNOSIS — F17200 Nicotine dependence, unspecified, uncomplicated: Secondary | ICD-10-CM | POA: Diagnosis not present

## 2022-09-22 DIAGNOSIS — Z87442 Personal history of urinary calculi: Secondary | ICD-10-CM

## 2022-09-22 DIAGNOSIS — I4891 Unspecified atrial fibrillation: Secondary | ICD-10-CM | POA: Diagnosis not present

## 2022-09-22 DIAGNOSIS — G43909 Migraine, unspecified, not intractable, without status migrainosus: Secondary | ICD-10-CM | POA: Diagnosis present

## 2022-09-22 DIAGNOSIS — F32A Depression, unspecified: Secondary | ICD-10-CM | POA: Diagnosis present

## 2022-09-22 DIAGNOSIS — Z7401 Bed confinement status: Secondary | ICD-10-CM | POA: Diagnosis not present

## 2022-09-22 DIAGNOSIS — S12000A Unspecified displaced fracture of first cervical vertebra, initial encounter for closed fracture: Secondary | ICD-10-CM | POA: Diagnosis present

## 2022-09-22 DIAGNOSIS — R296 Repeated falls: Secondary | ICD-10-CM | POA: Diagnosis present

## 2022-09-22 DIAGNOSIS — Z66 Do not resuscitate: Secondary | ICD-10-CM | POA: Diagnosis not present

## 2022-09-22 DIAGNOSIS — R4182 Altered mental status, unspecified: Secondary | ICD-10-CM | POA: Diagnosis not present

## 2022-09-22 DIAGNOSIS — E785 Hyperlipidemia, unspecified: Secondary | ICD-10-CM | POA: Diagnosis present

## 2022-09-22 DIAGNOSIS — J449 Chronic obstructive pulmonary disease, unspecified: Secondary | ICD-10-CM | POA: Diagnosis present

## 2022-09-22 DIAGNOSIS — Z4682 Encounter for fitting and adjustment of non-vascular catheter: Secondary | ICD-10-CM | POA: Diagnosis not present

## 2022-09-22 DIAGNOSIS — Z888 Allergy status to other drugs, medicaments and biological substances status: Secondary | ICD-10-CM | POA: Diagnosis not present

## 2022-09-22 DIAGNOSIS — R5383 Other fatigue: Secondary | ICD-10-CM | POA: Diagnosis not present

## 2022-09-22 DIAGNOSIS — J95821 Acute postprocedural respiratory failure: Secondary | ICD-10-CM | POA: Diagnosis not present

## 2022-09-22 DIAGNOSIS — J9811 Atelectasis: Secondary | ICD-10-CM | POA: Diagnosis present

## 2022-09-22 DIAGNOSIS — R9389 Abnormal findings on diagnostic imaging of other specified body structures: Secondary | ICD-10-CM | POA: Diagnosis not present

## 2022-09-22 DIAGNOSIS — S12090A Other displaced fracture of first cervical vertebra, initial encounter for closed fracture: Secondary | ICD-10-CM | POA: Diagnosis not present

## 2022-09-22 DIAGNOSIS — Z9221 Personal history of antineoplastic chemotherapy: Secondary | ICD-10-CM | POA: Diagnosis not present

## 2022-09-22 DIAGNOSIS — F17210 Nicotine dependence, cigarettes, uncomplicated: Secondary | ICD-10-CM | POA: Diagnosis present

## 2022-09-22 DIAGNOSIS — Z7951 Long term (current) use of inhaled steroids: Secondary | ICD-10-CM

## 2022-09-22 DIAGNOSIS — F329 Major depressive disorder, single episode, unspecified: Secondary | ICD-10-CM | POA: Diagnosis not present

## 2022-09-22 DIAGNOSIS — Z9981 Dependence on supplemental oxygen: Secondary | ICD-10-CM | POA: Diagnosis not present

## 2022-09-22 DIAGNOSIS — J9691 Respiratory failure, unspecified with hypoxia: Secondary | ICD-10-CM | POA: Diagnosis not present

## 2022-09-22 DIAGNOSIS — Z79890 Hormone replacement therapy (postmenopausal): Secondary | ICD-10-CM

## 2022-09-22 HISTORY — PX: POSTERIOR CERVICAL FUSION/FORAMINOTOMY: SHX5038

## 2022-09-22 LAB — POCT I-STAT 7, (LYTES, BLD GAS, ICA,H+H)
Acid-base deficit: 6 mmol/L — ABNORMAL HIGH (ref 0.0–2.0)
Acid-base deficit: 6 mmol/L — ABNORMAL HIGH (ref 0.0–2.0)
Bicarbonate: 27 mmol/L (ref 20.0–28.0)
Bicarbonate: 20.5 mmol/L (ref 20.0–28.0)
Calcium, Ion: 1.32 mmol/L (ref 1.15–1.40)
Calcium, Ion: 1.12 mmol/L — ABNORMAL LOW (ref 1.15–1.40)
HCT: 36 % (ref 36.0–46.0)
HCT: 32 % — ABNORMAL LOW (ref 36.0–46.0)
Hemoglobin: 12.2 g/dL (ref 12.0–15.0)
Hemoglobin: 10.9 g/dL — ABNORMAL LOW (ref 12.0–15.0)
O2 Saturation: 82 %
O2 Saturation: 100 %
Patient temperature: 37
Potassium: 3.2 mmol/L — ABNORMAL LOW (ref 3.5–5.1)
Potassium: 3.1 mmol/L — ABNORMAL LOW (ref 3.5–5.1)
Sodium: 140 mmol/L (ref 135–145)
Sodium: 140 mmol/L (ref 135–145)
TCO2: 30 mmol/L (ref 22–32)
TCO2: 22 mmol/L (ref 22–32)
pCO2 arterial: 100.3 mmHg (ref 32–48)
pCO2 arterial: 41.7 mmHg (ref 32–48)
pH, Arterial: 7.039 — CL (ref 7.35–7.45)
pH, Arterial: 7.3 — ABNORMAL LOW (ref 7.35–7.45)
pO2, Arterial: 69 mmHg — ABNORMAL LOW (ref 83–108)
pO2, Arterial: 372 mmHg — ABNORMAL HIGH (ref 83–108)

## 2022-09-22 LAB — ABO/RH: ABO/RH(D): O POS

## 2022-09-22 LAB — MRSA NEXT GEN BY PCR, NASAL: MRSA by PCR Next Gen: NOT DETECTED

## 2022-09-22 SURGERY — POSTERIOR CERVICAL FUSION/FORAMINOTOMY LEVEL 1
Anesthesia: General

## 2022-09-22 MED ORDER — FENTANYL CITRATE PF 50 MCG/ML IJ SOSY
PREFILLED_SYRINGE | INTRAMUSCULAR | Status: AC
  Administered 2022-09-22: 50 ug
  Filled 2022-09-22: qty 2

## 2022-09-22 MED ORDER — ATORVASTATIN CALCIUM 10 MG PO TABS
10.0000 mg | ORAL_TABLET | Freq: Every day | ORAL | Status: DC
  Administered 2022-09-22 – 2022-09-24 (×3): 10 mg
  Filled 2022-09-22 (×3): qty 1

## 2022-09-22 MED ORDER — ALBUTEROL SULFATE (2.5 MG/3ML) 0.083% IN NEBU
2.5000 mg | INHALATION_SOLUTION | RESPIRATORY_TRACT | Status: DC | PRN
  Administered 2022-09-22: 2.5 mg via RESPIRATORY_TRACT
  Filled 2022-09-22: qty 3

## 2022-09-22 MED ORDER — OXYCODONE-ACETAMINOPHEN 7.5-325 MG PO TABS: 1.0000 | ORAL_TABLET | ORAL | Status: DC | PRN

## 2022-09-22 MED ORDER — ORAL CARE MOUTH RINSE
15.0000 mL | OROMUCOSAL | Status: DC
  Administered 2022-09-22: 15 mL via OROMUCOSAL

## 2022-09-22 MED ORDER — FENTANYL CITRATE PF 50 MCG/ML IJ SOSY
25.0000 ug | PREFILLED_SYRINGE | INTRAMUSCULAR | Status: DC | PRN
  Administered 2022-09-22 – 2022-09-24 (×9): 50 ug via INTRAVENOUS
  Administered 2022-09-24: 100 ug via INTRAVENOUS
  Filled 2022-09-22 (×2): qty 1
  Filled 2022-09-22: qty 2
  Filled 2022-09-22 (×3): qty 1
  Filled 2022-09-22: qty 2
  Filled 2022-09-22 (×4): qty 1

## 2022-09-22 MED ORDER — PROPOFOL 1000 MG/100ML IV EMUL
5.0000 ug/kg/min | INTRAVENOUS | Status: DC
  Administered 2022-09-22: 10 ug/kg/min via INTRAVENOUS
  Filled 2022-09-22 (×2): qty 100

## 2022-09-22 MED ORDER — ROCURONIUM BROMIDE 10 MG/ML (PF) SYRINGE
PREFILLED_SYRINGE | INTRAVENOUS | Status: AC
  Filled 2022-09-22: qty 20

## 2022-09-22 MED ORDER — CYCLOBENZAPRINE HCL 10 MG PO TABS: 5.0000 mg | ORAL_TABLET | Freq: Three times a day (TID) | ORAL | Status: DC | PRN

## 2022-09-22 MED ORDER — THROMBIN 5000 UNITS EX SOLR
CUTANEOUS | Status: AC
  Filled 2022-09-22: qty 5000

## 2022-09-22 MED ORDER — ATORVASTATIN CALCIUM 10 MG PO TABS: 10.0000 mg | ORAL_TABLET | Freq: Every day | ORAL | Status: DC

## 2022-09-22 MED ORDER — ALUM & MAG HYDROXIDE-SIMETH 200-200-20 MG/5ML PO SUSP: 30.0000 mL | Freq: Four times a day (QID) | ORAL | Status: DC | PRN

## 2022-09-22 MED ORDER — ROCURONIUM BROMIDE 100 MG/10ML IV SOLN
INTRAVENOUS | Status: DC | PRN
  Administered 2022-09-22: 80 mg via INTRAVENOUS
  Administered 2022-09-22: 40 mg via INTRAVENOUS
  Administered 2022-09-22: 20 mg via INTRAVENOUS

## 2022-09-22 MED ORDER — ACETAMINOPHEN 500 MG PO TABS
ORAL_TABLET | ORAL | Status: AC
  Administered 2022-09-22: 1000 mg via ORAL
  Filled 2022-09-22: qty 2

## 2022-09-22 MED ORDER — LIDOCAINE 2% (20 MG/ML) 5 ML SYRINGE
INTRAMUSCULAR | Status: DC | PRN
  Administered 2022-09-22: 60 mg via INTRAVENOUS

## 2022-09-22 MED ORDER — ORAL CARE MOUTH RINSE: 15.0000 mL | OROMUCOSAL | Status: DC | PRN

## 2022-09-22 MED ORDER — PROPOFOL 10 MG/ML IV BOLUS
INTRAVENOUS | Status: AC
  Filled 2022-09-22: qty 20

## 2022-09-22 MED ORDER — FENTANYL CITRATE (PF) 100 MCG/2ML IJ SOLN
25.0000 ug | INTRAMUSCULAR | Status: DC | PRN
  Administered 2022-09-22 (×2): 50 ug via INTRAVENOUS

## 2022-09-22 MED ORDER — LACTATED RINGERS IV SOLN: INTRAVENOUS | Status: DC | PRN

## 2022-09-22 MED ORDER — CHLORHEXIDINE GLUCONATE 0.12 % MT SOLN: 15.0000 mL | Freq: Once | OROMUCOSAL | Status: AC

## 2022-09-22 MED ORDER — LIDOCAINE 2% (20 MG/ML) 5 ML SYRINGE
INTRAMUSCULAR | Status: AC
  Filled 2022-09-22: qty 5

## 2022-09-22 MED ORDER — ACETAMINOPHEN 500 MG PO TABS
1000.0000 mg | ORAL_TABLET | Freq: Four times a day (QID) | ORAL | Status: AC
  Administered 2022-09-23 (×3): 1000 mg
  Filled 2022-09-22 (×3): qty 2

## 2022-09-22 MED ORDER — ACETAMINOPHEN 325 MG PO TABS: 650.0000 mg | ORAL_TABLET | ORAL | Status: DC | PRN

## 2022-09-22 MED ORDER — ONDANSETRON HCL 4 MG/2ML IJ SOLN: 4.0000 mg | Freq: Four times a day (QID) | INTRAMUSCULAR | Status: DC | PRN

## 2022-09-22 MED ORDER — ACETAMINOPHEN 650 MG RE SUPP: 650.0000 mg | RECTAL | Status: DC | PRN

## 2022-09-22 MED ORDER — LEVOTHYROXINE SODIUM 25 MCG PO TABS
125.0000 ug | ORAL_TABLET | Freq: Every day | ORAL | Status: DC
  Administered 2022-09-23 – 2022-09-24 (×2): 125 ug
  Filled 2022-09-22 (×2): qty 1

## 2022-09-22 MED ORDER — MIDAZOLAM HCL 2 MG/2ML IJ SOLN: 4.0000 mg | Freq: Once | INTRAMUSCULAR | Status: AC

## 2022-09-22 MED ORDER — 0.9 % SODIUM CHLORIDE (POUR BTL) OPTIME
TOPICAL | Status: DC | PRN
  Administered 2022-09-22: 1000 mL

## 2022-09-22 MED ORDER — LABETALOL HCL 5 MG/ML IV SOLN: 10.0000 mg | Freq: Once | INTRAVENOUS | Status: DC

## 2022-09-22 MED ORDER — OXYCODONE HCL 5 MG PO TABS: 5.0000 mg | ORAL_TABLET | ORAL | Status: DC | PRN

## 2022-09-22 MED ORDER — TOPIRAMATE 25 MG PO TABS
25.0000 mg | ORAL_TABLET | Freq: Two times a day (BID) | ORAL | Status: DC
  Administered 2022-09-22 – 2022-09-24 (×4): 25 mg
  Filled 2022-09-22 (×4): qty 1

## 2022-09-22 MED ORDER — THROMBIN 5000 UNITS EX SOLR
OROMUCOSAL | Status: DC | PRN
  Administered 2022-09-22: 5 mL via TOPICAL

## 2022-09-22 MED ORDER — CHLORHEXIDINE GLUCONATE 0.12 % MT SOLN
OROMUCOSAL | Status: AC
  Administered 2022-09-22: 15 mL via OROMUCOSAL
  Filled 2022-09-22: qty 15

## 2022-09-22 MED ORDER — DIPHENOXYLATE-ATROPINE 2.5-0.025 MG PO TABS
1.0000 | ORAL_TABLET | Freq: Four times a day (QID) | ORAL | Status: DC | PRN
  Filled 2022-09-22: qty 1

## 2022-09-22 MED ORDER — PANTOPRAZOLE 2 MG/ML SUSPENSION: 40.0000 mg | Freq: Every day | ORAL | Status: DC

## 2022-09-22 MED ORDER — PROPOFOL 1000 MG/100ML IV EMUL
0.0000 ug/kg/min | INTRAVENOUS | Status: DC
  Administered 2022-09-22 – 2022-09-23 (×4): 30 ug/kg/min via INTRAVENOUS
  Administered 2022-09-23 – 2022-09-24 (×2): 25 ug/kg/min via INTRAVENOUS
  Filled 2022-09-22 (×5): qty 100

## 2022-09-22 MED ORDER — ACETAMINOPHEN 500 MG PO TABS: 1000.0000 mg | ORAL_TABLET | Freq: Once | ORAL | Status: AC

## 2022-09-22 MED ORDER — ORAL CARE MOUTH RINSE: 15.0000 mL | Freq: Once | OROMUCOSAL | Status: AC

## 2022-09-22 MED ORDER — CHLORHEXIDINE GLUCONATE CLOTH 2 % EX PADS: 6.0000 | MEDICATED_PAD | Freq: Once | CUTANEOUS | Status: DC

## 2022-09-22 MED ORDER — CEFAZOLIN SODIUM-DEXTROSE 2-4 GM/100ML-% IV SOLN
INTRAVENOUS | Status: AC
  Filled 2022-09-22: qty 100

## 2022-09-22 MED ORDER — FENTANYL CITRATE (PF) 100 MCG/2ML IJ SOLN
INTRAMUSCULAR | Status: AC
  Filled 2022-09-22: qty 2

## 2022-09-22 MED ORDER — ALBUTEROL SULFATE HFA 108 (90 BASE) MCG/ACT IN AERS: 2.0000 | INHALATION_SPRAY | RESPIRATORY_TRACT | Status: DC | PRN

## 2022-09-22 MED ORDER — ONDANSETRON HCL 4 MG/2ML IJ SOLN
INTRAMUSCULAR | Status: AC
  Filled 2022-09-22: qty 2

## 2022-09-22 MED ORDER — PHENYLEPHRINE 80 MCG/ML (10ML) SYRINGE FOR IV PUSH (FOR BLOOD PRESSURE SUPPORT)
PREFILLED_SYRINGE | INTRAVENOUS | Status: AC
  Filled 2022-09-22: qty 10

## 2022-09-22 MED ORDER — ACETAMINOPHEN 500 MG PO TABS: 1000.0000 mg | ORAL_TABLET | Freq: Four times a day (QID) | ORAL | Status: DC

## 2022-09-22 MED ORDER — BISACODYL 10 MG RE SUPP: 10.0000 mg | Freq: Every day | RECTAL | Status: DC | PRN

## 2022-09-22 MED ORDER — DOCUSATE SODIUM 100 MG PO CAPS
100.0000 mg | ORAL_CAPSULE | Freq: Two times a day (BID) | ORAL | Status: DC
  Filled 2022-09-22: qty 1

## 2022-09-22 MED ORDER — MENTHOL 3 MG MT LOZG: 1.0000 | LOZENGE | OROMUCOSAL | Status: DC | PRN

## 2022-09-22 MED ORDER — PHENOL 1.4 % MT LIQD: 1.0000 | OROMUCOSAL | Status: DC | PRN

## 2022-09-22 MED ORDER — ONDANSETRON HCL 4 MG/2ML IJ SOLN
INTRAMUSCULAR | Status: DC | PRN
  Administered 2022-09-22: 4 mg via INTRAVENOUS

## 2022-09-22 MED ORDER — ARFORMOTEROL TARTRATE 15 MCG/2ML IN NEBU
15.0000 ug | INHALATION_SOLUTION | Freq: Two times a day (BID) | RESPIRATORY_TRACT | Status: DC
  Administered 2022-09-22 – 2022-09-24 (×4): 15 ug via RESPIRATORY_TRACT
  Filled 2022-09-22 (×4): qty 2

## 2022-09-22 MED ORDER — CHLORHEXIDINE GLUCONATE CLOTH 2 % EX PADS
6.0000 | MEDICATED_PAD | Freq: Every day | CUTANEOUS | Status: DC
  Administered 2022-09-22 – 2022-09-23 (×2): 6 via TOPICAL

## 2022-09-22 MED ORDER — MOMETASONE FURO-FORMOTEROL FUM 200-5 MCG/ACT IN AERO
2.0000 | INHALATION_SPRAY | Freq: Two times a day (BID) | RESPIRATORY_TRACT | Status: DC
  Filled 2022-09-22: qty 8.8

## 2022-09-22 MED ORDER — LACTATED RINGERS IV SOLN: INTRAVENOUS | Status: DC

## 2022-09-22 MED ORDER — PHENYLEPHRINE HCL-NACL 20-0.9 MG/250ML-% IV SOLN
INTRAVENOUS | Status: DC | PRN
  Administered 2022-09-22: 25 ug/min via INTRAVENOUS

## 2022-09-22 MED ORDER — DEXAMETHASONE SODIUM PHOSPHATE 4 MG/ML IJ SOLN
INTRAMUSCULAR | Status: DC | PRN
  Administered 2022-09-22: 8 mg via INTRAVENOUS

## 2022-09-22 MED ORDER — CEFAZOLIN SODIUM-DEXTROSE 2-4 GM/100ML-% IV SOLN
2.0000 g | INTRAVENOUS | Status: AC
  Administered 2022-09-22: 2 g via INTRAVENOUS

## 2022-09-22 MED ORDER — BACITRACIN 500 UNIT/GM EX OINT
TOPICAL_OINTMENT | CUTANEOUS | Status: DC | PRN
  Administered 2022-09-22: 1 via TOPICAL

## 2022-09-22 MED ORDER — PANTOPRAZOLE SODIUM 40 MG IV SOLR: 40.0000 mg | Freq: Every day | INTRAVENOUS | Status: DC

## 2022-09-22 MED ORDER — BUPIVACAINE-EPINEPHRINE (PF) 0.5% -1:200000 IJ SOLN
INTRAMUSCULAR | Status: AC
  Filled 2022-09-22: qty 30

## 2022-09-22 MED ORDER — LEVOTHYROXINE SODIUM 25 MCG PO TABS: 125.0000 ug | ORAL_TABLET | Freq: Every day | ORAL | Status: DC

## 2022-09-22 MED ORDER — ONDANSETRON 4 MG PO TBDP: 8.0000 mg | ORAL_TABLET | Freq: Three times a day (TID) | ORAL | Status: DC | PRN

## 2022-09-22 MED ORDER — CYCLOBENZAPRINE HCL 10 MG PO TABS: 10.0000 mg | ORAL_TABLET | Freq: Three times a day (TID) | ORAL | Status: DC | PRN

## 2022-09-22 MED ORDER — DEXAMETHASONE SODIUM PHOSPHATE 10 MG/ML IJ SOLN
INTRAMUSCULAR | Status: AC
  Filled 2022-09-22: qty 1

## 2022-09-22 MED ORDER — SUGAMMADEX SODIUM 200 MG/2ML IV SOLN
INTRAVENOUS | Status: DC | PRN
  Administered 2022-09-22: 200 mg via INTRAVENOUS

## 2022-09-22 MED ORDER — ZOLPIDEM TARTRATE 5 MG PO TABS: 5.0000 mg | ORAL_TABLET | Freq: Every evening | ORAL | Status: DC | PRN

## 2022-09-22 MED ORDER — PANTOPRAZOLE 2 MG/ML SUSPENSION
40.0000 mg | Freq: Every day | ORAL | Status: DC
  Administered 2022-09-22 – 2022-09-24 (×3): 40 mg
  Filled 2022-09-22 (×3): qty 20

## 2022-09-22 MED ORDER — REVEFENACIN 175 MCG/3ML IN SOLN
175.0000 ug | Freq: Every day | RESPIRATORY_TRACT | Status: DC
  Administered 2022-09-23 – 2022-09-24 (×2): 175 ug via RESPIRATORY_TRACT
  Filled 2022-09-22 (×4): qty 3

## 2022-09-22 MED ORDER — THROMBIN (RECOMBINANT) 5000 UNITS EX SOLR
CUTANEOUS | Status: DC | PRN
  Administered 2022-09-22: 5 mL via TOPICAL

## 2022-09-22 MED ORDER — ORAL CARE MOUTH RINSE
15.0000 mL | OROMUCOSAL | Status: DC
  Administered 2022-09-22 – 2022-09-24 (×21): 15 mL via OROMUCOSAL

## 2022-09-22 MED ORDER — OXYCODONE HCL 5 MG PO TABS: 10.0000 mg | ORAL_TABLET | ORAL | Status: DC | PRN

## 2022-09-22 MED ORDER — PROPOFOL 10 MG/ML IV BOLUS
INTRAVENOUS | Status: DC | PRN
  Administered 2022-09-22: 50 mg via INTRAVENOUS
  Administered 2022-09-22: 30 mg via INTRAVENOUS

## 2022-09-22 MED ORDER — DOCUSATE SODIUM 50 MG/5ML PO LIQD: 100.0000 mg | Freq: Two times a day (BID) | ORAL | Status: DC

## 2022-09-22 MED ORDER — MIDAZOLAM HCL 2 MG/2ML IJ SOLN
INTRAMUSCULAR | Status: AC
  Filled 2022-09-22: qty 4

## 2022-09-22 MED ORDER — POTASSIUM CHLORIDE 20 MEQ PO PACK
40.0000 meq | PACK | Freq: Once | ORAL | Status: AC
  Administered 2022-09-22: 40 meq
  Filled 2022-09-22: qty 2

## 2022-09-22 MED ORDER — PHENYLEPHRINE 80 MCG/ML (10ML) SYRINGE FOR IV PUSH (FOR BLOOD PRESSURE SUPPORT)
PREFILLED_SYRINGE | INTRAVENOUS | Status: DC | PRN
  Administered 2022-09-22: 160 ug via INTRAVENOUS

## 2022-09-22 MED ORDER — TOPIRAMATE 25 MG PO TABS: 25.0000 mg | ORAL_TABLET | Freq: Two times a day (BID) | ORAL | Status: DC

## 2022-09-22 MED ORDER — SODIUM CHLORIDE 0.9 % IV SOLN
0.0000 ug/min | INTRAVENOUS | Status: DC
  Administered 2022-09-22: 50 ug/min via INTRAVENOUS
  Filled 2022-09-22: qty 2

## 2022-09-22 MED ORDER — FENTANYL CITRATE (PF) 250 MCG/5ML IJ SOLN
INTRAMUSCULAR | Status: AC
  Filled 2022-09-22: qty 5

## 2022-09-22 MED ORDER — BUPIVACAINE-EPINEPHRINE 0.5% -1:200000 IJ SOLN
INTRAMUSCULAR | Status: DC | PRN
  Administered 2022-09-22 (×2): 10 mL

## 2022-09-22 MED ORDER — ONDANSETRON HCL 4 MG PO TABS: 4.0000 mg | ORAL_TABLET | Freq: Four times a day (QID) | ORAL | Status: DC | PRN

## 2022-09-22 MED ORDER — POLYETHYLENE GLYCOL 3350 17 G PO PACK
17.0000 g | PACK | Freq: Every day | ORAL | Status: DC
  Administered 2022-09-23 – 2022-09-24 (×2): 17 g
  Filled 2022-09-22 (×2): qty 1

## 2022-09-22 MED ORDER — CEFAZOLIN SODIUM-DEXTROSE 2-4 GM/100ML-% IV SOLN
2.0000 g | Freq: Three times a day (TID) | INTRAVENOUS | Status: AC
  Administered 2022-09-22 – 2022-09-23 (×2): 2 g via INTRAVENOUS
  Filled 2022-09-22 (×2): qty 100

## 2022-09-22 MED ORDER — FENTANYL CITRATE (PF) 100 MCG/2ML IJ SOLN
INTRAMUSCULAR | Status: DC | PRN
  Administered 2022-09-22: 100 ug via INTRAVENOUS
  Administered 2022-09-22 (×2): 50 ug via INTRAVENOUS

## 2022-09-22 MED ORDER — BACITRACIN ZINC 500 UNIT/GM EX OINT
TOPICAL_OINTMENT | CUTANEOUS | Status: AC
  Filled 2022-09-22: qty 28.35

## 2022-09-22 MED ORDER — LABETALOL HCL 5 MG/ML IV SOLN
INTRAVENOUS | Status: AC
  Filled 2022-09-22: qty 4

## 2022-09-22 MED ORDER — MORPHINE SULFATE (PF) 4 MG/ML IV SOLN
4.0000 mg | INTRAVENOUS | Status: DC | PRN
  Administered 2022-09-25 – 2022-09-26 (×9): 4 mg via INTRAVENOUS
  Filled 2022-09-22 (×10): qty 1

## 2022-09-22 MED ORDER — FENTANYL CITRATE PF 50 MCG/ML IJ SOSY: 100.0000 ug | PREFILLED_SYRINGE | Freq: Once | INTRAMUSCULAR | Status: DC

## 2022-09-22 MED ORDER — BUDESONIDE 0.5 MG/2ML IN SUSP
0.5000 mg | Freq: Two times a day (BID) | RESPIRATORY_TRACT | Status: DC
  Administered 2022-09-22 – 2022-09-24 (×4): 0.5 mg via RESPIRATORY_TRACT
  Filled 2022-09-22 (×4): qty 2

## 2022-09-22 SURGICAL SUPPLY — 67 items
BAG COUNTER SPONGE SURGICOUNT (BAG) ×1 IMPLANT
BAND RUBBER #18 3X1/16 STRL (MISCELLANEOUS) IMPLANT
BENZOIN TINCTURE PRP APPL 2/3 (GAUZE/BANDAGES/DRESSINGS) IMPLANT
BIT DRILL NAV VIRAGE 2.3 (BIT) IMPLANT
BIT DRILL NEURO 2X3.1 SFT TUCH (MISCELLANEOUS) ×1 IMPLANT
BLADE CLIPPER SURG (BLADE) IMPLANT
BLADE ULTRA TIP 2M (BLADE) IMPLANT
BUR 14 MATCH 3 (BUR) IMPLANT
BUR 15 MATCH 2.2 (BUR) IMPLANT
BUR MR8 14 BALL 5 (BUR) IMPLANT
BURR 14 MATCH 3 (BUR) IMPLANT
BURR 15 MATCH 2.2 (BUR) IMPLANT
BURR MR8 14 BALL 5 (BUR) IMPLANT
CANISTER SUCT 3000ML PPV (MISCELLANEOUS) ×1 IMPLANT
CAP CLSR POST CERV (Cap) IMPLANT
COVERAGE SUPPORT O-ARM STEALTH (MISCELLANEOUS) ×1 IMPLANT
DRAPE C-ARM 42X72 X-RAY (DRAPES) ×2 IMPLANT
DRAPE LAPAROTOMY 100X72 PEDS (DRAPES) ×1 IMPLANT
DRAPE MICROSCOPE SLANT 54X150 (MISCELLANEOUS) IMPLANT
DRAPE SHEET LG 3/4 BI-LAMINATE (DRAPES) ×4 IMPLANT
DRAPE SURG 17X23 STRL (DRAPES) ×3 IMPLANT
DRILL NEURO 2X3.1 SOFT TOUCH (MISCELLANEOUS) IMPLANT
DRSG OPSITE POSTOP 4X6 (GAUZE/BANDAGES/DRESSINGS) IMPLANT
ELECT REM PT RETURN 9FT ADLT (ELECTROSURGICAL) ×1 IMPLANT
ELECTRODE REM PT RTRN 9FT ADLT (ELECTROSURGICAL) ×1 IMPLANT
FEE COVERAGE SUPPORT O-ARM (MISCELLANEOUS) IMPLANT
GAUZE 4X4 16PLY ~~LOC~~+RFID DBL (SPONGE) IMPLANT
GAUZE SPONGE 4X4 12PLY STRL (GAUZE/BANDAGES/DRESSINGS) IMPLANT
GLOVE BIO SURGEON STRL SZ8 (GLOVE) ×2 IMPLANT
GLOVE BIO SURGEON STRL SZ8.5 (GLOVE) ×2 IMPLANT
GLOVE EXAM NITRILE XL STR (GLOVE) IMPLANT
GOWN STRL REUS W/ TWL LRG LVL3 (GOWN DISPOSABLE) IMPLANT
GOWN STRL REUS W/ TWL XL LVL3 (GOWN DISPOSABLE) ×1 IMPLANT
GOWN STRL REUS W/TWL 2XL LVL3 (GOWN DISPOSABLE) ×1 IMPLANT
GOWN STRL REUS W/TWL LRG LVL3 (GOWN DISPOSABLE) ×2
GOWN STRL REUS W/TWL XL LVL3 (GOWN DISPOSABLE) ×4
HEMOSTAT POWDER KIT SURGIFOAM (HEMOSTASIS) IMPLANT
KIT BASIN OR (CUSTOM PROCEDURE TRAY) ×1 IMPLANT
KIT TURNOVER KIT B (KITS) ×1 IMPLANT
MARKER SPHERE PSV REFLC NDI (MISCELLANEOUS) ×5 IMPLANT
NDL SPNL 18GX3.5 QUINCKE PK (NEEDLE) IMPLANT
NEEDLE HYPO 22GX1.5 SAFETY (NEEDLE) ×1 IMPLANT
NEEDLE SPNL 18GX3.5 QUINCKE PK (NEEDLE) IMPLANT
NS IRRIG 1000ML POUR BTL (IV SOLUTION) ×1 IMPLANT
PACK LAMINECTOMY NEURO (CUSTOM PROCEDURE TRAY) ×1 IMPLANT
PAD ARMBOARD 7.5X6 YLW CONV (MISCELLANEOUS) ×3 IMPLANT
PATTIES SURGICAL .25X.25 (GAUZE/BANDAGES/DRESSINGS) IMPLANT
PIN MAYFIELD SKULL DISP (PIN) ×1 IMPLANT
PUTTY DBM 5CC CALC GRAN (Putty) IMPLANT
ROD VIRAGE 25MMX3.5MM (Rod) IMPLANT
SCREW PA SMOOTH VIRAGE 3.5X24 (Screw) IMPLANT
SCREW VIRAGE 3.5X24MM (Screw) IMPLANT
SPIKE FLUID TRANSFER (MISCELLANEOUS) ×1 IMPLANT
SPONGE NEURO XRAY DETECT 1X3 (DISPOSABLE) IMPLANT
SPONGE SURGIFOAM ABS GEL SZ50 (HEMOSTASIS) ×1 IMPLANT
SPONGE T-LAP 4X18 ~~LOC~~+RFID (SPONGE) IMPLANT
STAPLER SKIN PROX WIDE 3.9 (STAPLE) IMPLANT
STRIP CLOSURE SKIN 1/2X4 (GAUZE/BANDAGES/DRESSINGS) IMPLANT
SUT ETHILON 2 0 FS 18 (SUTURE) IMPLANT
SUT VIC AB 0 CT1 18XCR BRD8 (SUTURE) ×1 IMPLANT
SUT VIC AB 0 CT1 8-18 (SUTURE) ×1
SUT VIC AB 2-0 CP2 18 (SUTURE) ×1 IMPLANT
TOWEL GREEN STERILE (TOWEL DISPOSABLE) ×1 IMPLANT
TOWEL GREEN STERILE FF (TOWEL DISPOSABLE) ×1 IMPLANT
TRAY FOLEY MTR SLVR 14FR STAT (SET/KITS/TRAYS/PACK) IMPLANT
TRAY FOLEY MTR SLVR 16FR STAT (SET/KITS/TRAYS/PACK) IMPLANT
WATER STERILE IRR 1000ML POUR (IV SOLUTION) ×1 IMPLANT

## 2022-09-22 NOTE — Progress Notes (Signed)
[facility] PALLIATIVE CARE RN NOTE  PATIENT NAME: Tammy Bowers DOB: 25-Jan-1951 MRN: [id number]  PRIMARY CARE PROVIDER: Mckinley Jewel, MD  RESPONSIBLE PARTY: Fransisca Connors (spouse) Acct ID - Guarantor Home Phone Work Phone Relationship Acct Type  [id number] [facility] [phone]  Self P/F     [address], [place], [place] [address]    RN/SW team completed initial palliative care consult visit with patient and husband in their home.  Cognitive: Patient is alert and oriented x 3. Flat affect mainly, but did smile and laugh towards end of visit  Pain: Reports pain 5/10 in posterior neck. Currently taking Oxycodone every 8 hours and Tylenol for breakthrough pain. Scheduled to have neck surgery 09/22/22 due to some fractures. Will not wear neck collar as she says it is not comfortable. Took Oxycodone during visit.  Respiratory: Reports increased shortness of breath, especially with exertion. O2 saturation today 87% on room air. Using Albuterol rescue inhaler 6-8 times/day. Continues with maintenance inhalers daily. Husband inquiring about Oxygen. Reports hemoptysis. Rhonchi noted to bilateral upper lobes. Denies chest pain. Afebrile. Requires frequent rest periods d/t dyspnea  Mobility: Currently working with PT through [facility] 2x/week. She is able to stand, take a few steps and transfer to wheelchair. Mainly transported via wheelchair in the home to conserve energy. Requires assistance with bathing/dressing.  Appetite: Patient reports not having much of an appetite. She does report nausea. Encouraged husband to administer Zofran if feeling nauseated prior to meals to see if this will help her eat better.   Goals of care: Remain in her home. She is a Full Code. Wishes to stay on palliative at this time. Not ready for hospice as of yet. Wants patient to continue with therapy.   Called and left a voicemail for Dr. Libby Maw nurse at [facility] to advise of patient's oxygen level  of 87% on room air and husband requesting oxygen for patient. Awaiting return call.   HISTORY OF PRESENT ILLNESS:  This is a 71 yo female with a diagnosis of small cell lung cancer. She has a history of COPD, TIA, hypothyroidism, CKD Stae 3b and abnormal gait. Palliative care team was asked to follow patient to assist with symptom management needs, goals of care and complex decision making.  CODE STATUS: Full code ADVANCED DIRECTIVES: N MOST FORM: no PPS: 40%   PHYSICAL EXAM:   VITALS: Today's Vitals   09/20/22 1321  BP: 138/82  Pulse: (!) 101  Resp: 20  Temp: 98.2 F (36.8 C)  TempSrc: Temporal  SpO2: (!) 87%  PainSc: 5   PainLoc: Neck     Daryl Eastern, RN BSN

## 2022-09-22 NOTE — Anesthesia Procedure Notes (Addendum)
  Arterial Line Insertion Start/End10/04/2022 7:58 AM, 09/22/2022 8:08 AM Performed by: Duane Boston, MD  Patient location: Pre-op. Preanesthetic checklist: patient identified, IV checked, site marked, risks and benefits discussed, surgical consent, monitors and equipment checked, pre-op evaluation, timeout performed and anesthesia consent Lidocaine 1% used for infiltration Left, brachial was placed Catheter size: 20 Fr Hand hygiene performed  and maximum sterile barriers used   Attempts: 1 Procedure performed using ultrasound guided technique. Ultrasound Notes:anatomy identified, no ultrasound evidence of intravascular and/or intraneural injection and image(s) printed for medical record Following insertion, dressing applied and Biopatch. Post procedure assessment: normal and unchanged  Post procedure complications: second provider assisted. Patient tolerated the procedure well with no immediate complications. Additional procedure comments: Failed radial attempts by others.Marland Kitchen

## 2022-09-22 NOTE — H&P (Signed)
Subjective: The patient is a 71 year old white female smoker who took a fall on 07/22/2022 suffering C1 ring fractures and a type II odontoid fracture.  I discussed the various treatment options with her.  She has decided proceed with surgery.  Past Medical History:  Diagnosis Date   Adenopathy 10/10/2016   PERICARINAL   Anemia    Arthritis    Asthma    COPD (chronic obstructive pulmonary disease) ([facility])    Depression    Dyspnea    Headache    migraines   Hilar mass 10/10/2016   RIGHT   History of kidney stones    Hypothyroidism    Insomnia    Lung nodules 10/10/2016   RIGHT LOWER LOBE   Mass of both adrenal glands ([place]) 10/10/2016   Mood swing    Pneumonia    SCL CA dx'd 2017   lung cancer Small Cell; recurrence 10/2018   Spinal headache    pt denies this and is unsure why it's listed   Substance abuse ([place])    TOBACCO   UNSPECIFIED INFECTION OF BONE ANKLE AND FOOT 10/22/2009   Annotation: left ankle Qualifier: Diagnosis of  By: Gustavo Lah      Past Surgical History:  Procedure Laterality Date   ANKLE SURGERY     hardware removal, and initial ankle surgery   APPENDECTOMY     IR IMAGING GUIDED PORT INSERTION  04/12/2019   LUNG BIOPSY N/A 10/19/2016   Procedure: LUNG BIOPSY;  Surgeon: Grace Isaac, MD;  Location: [place] [place];  Service: Thoracic;  Laterality: N/A;   LUNG BIOPSY N/A 02/20/2019   Procedure: LUNG BIOPSY;  Surgeon: Grace Isaac, MD;  Location: [place];  Service: Thoracic;  Laterality: N/A;   TONSILLECTOMY     VIDEO BRONCHOSCOPY WITH ENDOBRONCHIAL ULTRASOUND N/A 10/19/2016   Procedure: VIDEO BRONCHOSCOPY WITH ENDOBRONCHIAL ULTRASOUND;  Surgeon: Grace Isaac, MD;  Location: [facility];  Service: Thoracic;  Laterality: N/A;   VIDEO BRONCHOSCOPY WITH ENDOBRONCHIAL ULTRASOUND N/A 02/20/2019   Procedure: VIDEO BRONCHOSCOPY WITH ENDOBRONCHIAL ULTRASOUND;  Surgeon: Grace Isaac, MD;  Location: [facility];  Service: Thoracic;  Laterality: N/A;    Allergies   Allergen Reactions   Lidocaine Swelling    SWELLING REACTION UNSPECIFIED     Social History   Tobacco Use   Smoking status: Every Day    Packs/day: 0.25    Years: 50.00    Total pack years: 12.50    Types: Cigarettes   Smokeless tobacco: Never  Substance Use Topics   Alcohol use: Yes    Alcohol/week: 7.0 standard drinks of alcohol    Types: 7 Shots of liquor per week    Comment: irish creme with tea daily (just 1 drink)    Family History  Problem Relation Age of Onset   Heart disease Father    Colon cancer Father    Heart disease Paternal Grandfather    Ovarian cancer Mother    Prior to Admission medications   Medication Sig Start Date End Date Taking? Authorizing Provider  acetaminophen (TYLENOL) 500 MG tablet Take 1,000 mg by mouth every 6 (six) hours as needed for moderate pain.   Yes [provider]  albuterol (PROVENTIL HFA;VENTOLIN HFA) 108 (90 BASE) MCG/ACT inhaler Inhale 2 puffs into the lungs every 4 (four) hours as needed for wheezing.   Yes [provider]  atorvastatin (LIPITOR) 10 MG tablet Take 10 mg by mouth daily.   Yes [provider]  cyclobenzaprine (FLEXERIL)  5 MG tablet Take 5 mg by mouth 3 (three) times daily as needed for muscle spasms.   Yes [provider]  diphenoxylate-atropine (LOMOTIL) 2.5-0.025 MG tablet Take 1 tablet by mouth 4 (four) times daily as needed for diarrhea or loose stools. 03/30/19  Yes Wyatt Portela, MD  Fluticasone-Salmeterol (ADVAIR) 500-50 MCG/DOSE AEPB Inhale 1 puff into the lungs 2 (two) times daily.   Yes [provider]  levothyroxine (SYNTHROID) 125 MCG tablet Take 1 tablet (125 mcg total) by mouth daily before breakfast. 08/02/22  Yes Vann, Jessica U, DO  nystatin (MYCOSTATIN) 100000 UNIT/ML suspension Take 5 mLs (500,000 Units total) by mouth 4 (four) times daily. 07/26/22  Yes Dede Query T, PA-C  ondansetron (ZOFRAN-ODT) 8 MG disintegrating tablet Take 1 tablet (8 mg total) by  mouth every 8 (eight) hours as needed for nausea or vomiting. 07/26/22  Yes Dede Query T, PA-C  OVER THE COUNTER MEDICATION Take 1 capsule by mouth daily. [place] tail-"mushroom in a capsule"   Yes [provider]  oxyCODONE-acetaminophen (PERCOCET) 7.5-325 MG tablet Take 1 tablet by mouth every 8 (eight) hours as needed for severe pain. 09/06/22  Yes [provider]  topiramate (TOPAMAX) 50 MG tablet TAKE 1 TABLET(50 MG) BY MOUTH TWICE DAILY 03/29/22  Yes Suzzanne Cloud, NP  azithromycin (ZITHROMAX) 250 MG tablet 1 PO daily Patient not taking: Reported on 09/07/2022 08/02/22   Geradine Girt, DO  oxyCODONE-acetaminophen (PERCOCET/ROXICET) 5-325 MG tablet Take 1 tablet by mouth every 6 (six) hours as needed for severe pain. Patient not taking: Reported on 09/07/2022 07/22/22   Horton, Barbette Hair, MD  prochlorperazine (COMPAZINE) 10 MG tablet TAKE 1 TABLET(10 MG) BY MOUTH EVERY 6 HOURS AS NEEDED FOR NAUSEA OR VOMITING 03/08/19   Curt Bears, MD     Review of Systems  Positive ROS: As above  All other systems have been reviewed and were otherwise negative with the exception of those mentioned in the HPI and as above.  Objective: Vital signs in last 24 hours: Temp:  [98.4 F (36.9 C)] 98.4 F (36.9 C) (10/05 0557) Pulse Rate:  [99] 99 (10/05 0557) Resp:  [18] 18 (10/05 0557) BP: (129)/(97) 129/97 (10/05 0557) SpO2:  [95 %] 95 % (10/05 0557) Weight:  [72 kg] 72 kg (10/05 0557) Estimated body mass index is 25.62 kg/m as calculated from the following:   Height as of this encounter: 5\' 6"  (1.676 m).   Weight as of this encounter: 72 kg.   General Appearance: Alert Head: Normocephalic, without obvious abnormality, atraumatic Eyes: PERRL, conjunctiva/corneas clear, EOM's intact,    Ears: Normal  Throat: Normal  Neck: Supple, Back: unremarkable Lungs: Clear to auscultation bilaterally, respirations unlabored Heart: Regular rate and rhythm, no murmur, rub or  gallop Abdomen: Soft, non-tender Extremities: Extremities normal, atraumatic, no cyanosis or edema Skin: unremarkable  NEUROLOGIC:   Mental status: alert and oriented,Motor Exam - grossly normal Sensory Exam - grossly normal Reflexes:  Coordination - grossly normal Gait - grossly normal Balance - grossly normal Cranial Nerves: I: smell Not tested  II: visual acuity  OS: Normal  OD: Normal   II: visual fields Full to confrontation  II: pupils Equal, round, reactive to light  III,VII: ptosis None  III,IV,VI: extraocular muscles  Full ROM  V: mastication Normal  V: facial light touch sensation  Normal  V,VII: corneal reflex  Present  VII: facial muscle function - upper  Normal  VII: facial muscle function - lower Normal  VIII: hearing Not tested  IX: soft palate elevation  Normal  IX,X: gag reflex Present  XI: trapezius strength  5/5  XI: sternocleidomastoid strength 5/5  XI: neck flexion strength  5/5  XII: tongue strength  Normal    Data Review Lab Results  Component Value Date   WBC 8.7 09/09/2022   HGB 13.1 09/09/2022   HCT 40.0 09/09/2022   MCV 97.8 09/09/2022   PLT 451 (H) 09/09/2022   Lab Results  Component Value Date   NA 138 09/09/2022   K 3.1 (L) 09/09/2022   CL 108 09/09/2022   CO2 22 09/09/2022   BUN 21 09/09/2022   CREATININE 1.07 (H) 09/09/2022   GLUCOSE 130 (H) 09/09/2022   Lab Results  Component Value Date   INR 0.9 04/12/2019    Assessment/Plan: C1, C2 fractures: I have discussed the situation with the patient.  I reviewed her imaging studies with her and pointed out the abnormalities.  We have discussed the various treatment options including surgery.  I have described the surgical treatment option of a posterior C1-2 instrumentation and fusion.  I have shown her surgical models.  We have discussed the risk, benefits, alternatives, expected postoperative course, and likelihood of achieving our goals with surgery.  I have answered all her  questions.  She has decided proceed with surgery.   Ophelia Charter 09/22/2022 7:15 AM

## 2022-09-22 NOTE — Anesthesia Postprocedure Evaluation (Signed)
Anesthesia Post Note  Patient: Tammy Bowers  Procedure(s) Performed: Posterior Cervical one-two Instrumentation and fusion Application of O-Arm     Patient location during evaluation: PACU Anesthesia Type: General Level of consciousness: sedated Pain management: pain level controlled Vital Signs Assessment: post-procedure vital signs reviewed and stable Respiratory status: patient remains intubated per anesthesia plan Cardiovascular status: blood pressure returned to baseline and stable Postop Assessment: no apparent nausea or vomiting Anesthetic complications: no Comments: Pt reintubated in PACU due to poor ventilation post op. CXR revealed total white out of her right lung. Pt will remain intubated and transported to the ICU. CCM to evaluate.   No notable events documented.  Last Vitals:  Vitals:   09/22/22 1500 09/22/22 1507  BP: (!) 124/94   Pulse: (!) 106   Resp: 20   Temp: (!) 36.1 C   SpO2: 100% 98%    Last Pain:  Vitals:   09/22/22 1500  TempSrc: Axillary  PainSc:                  Naomie Crow,W. EDMOND

## 2022-09-22 NOTE — Anesthesia Procedure Notes (Signed)
Procedure Name: Intubation Date/Time: 09/22/2022 7:43 AM  Performed by: Lieutenant Diego, CRNAPre-anesthesia Checklist: Patient identified, Emergency Drugs available, Suction available and Patient being monitored Patient Re-evaluated:Patient Re-evaluated prior to induction Oxygen Delivery Method: Circle system utilized Preoxygenation: Pre-oxygenation with 100% oxygen Induction Type: IV induction Ventilation: Mask ventilation without difficulty Laryngoscope Size: Miller and 2 Grade View: Grade I Tube type: Oral Tube size: 7.0 mm Number of attempts: 1 Airway Equipment and Method: Stylet and Oral airway Placement Confirmation: ETT inserted through vocal cords under direct vision, positive ETCO2 and breath sounds checked- equal and bilateral Secured at: 22 cm Tube secured with: Tape Dental Injury: Teeth and Oropharynx as per pre-operative assessment

## 2022-09-22 NOTE — Progress Notes (Signed)
Called to PACU because patient is somnolent. Also hypertensive and in afib. On arrival to PACU pt noted to have very shallow breaths and not responding to stimulation. ABG was drawn that showed a pH of 7.0 and EtCO2 of 100. Pt was then reintubated. A cxr was obtained and shows the ETT in good position but she has white out of her entire right lung c/w a combination of atelectasis and plueral effusion. Repeat ABG shows a pH of 7.3, PCO2 of 40 and PO2 of 327. After correction of her acidosis, the patient became more responsive and she returned to sinus rhythm. I have consulted CCM due to her CXR results.

## 2022-09-22 NOTE — Op Note (Signed)
Brief history: The patient is a 71 year old white female smoker who fell suffering a C1 and C2 fracture.  We discussed the various treatment options.  She has decided proceed with surgery.  Preop diagnosis: C1 ring fracture, type II odontoid fracture with posterior displacement, cervicalgia  Postop diagnosis: The same  Procedure: C1-2 posterior instrumentation and fusion with intervertebral bone graft extender with application of spinal neuro navigation  Surgeon: Dr. Earle Gell  Assistant: Arnetha Massy, NP, and Consuella Lose MD  Anesthesia: General tracheal  Estimated blood loss: 125 cc  Specimens: None  Drains: None  Complications: None  Description of procedure: The patient was brought to the operating room by the anesthesia team.  General endotracheal anesthesia was induced.  I then applied the [place] three-point headrest to her calvarium.  We carefully turned her to the prone position on the chest rolls.  We confirmed satisfactory station and C1-2 with fluoroscopy.  The patient's suboccipital region was then shaved with clippers and then this region her posterior cervical and posterior thoracic region was prepared with Betadine scrub and Betadine solution.  Sterile drapes were applied.  I then injected the area to be incised with Marcaine with epinephrine solution.  Used a scalpel to make a midline incision at the cervical thoracic junction.  We used electrocautery to perform a bilateral subperiosteal section exposing the C1 lamina and C2 spinous process, lamina and facet.  We used the cerebellar and McCullough retractors for exposure.  We then obtained an intraoperative CT scan with the O-arm.  I dissected caudal to the C1 lamina which was quite loose.  I identified and palpated the C1 lateral mass bilaterally.  Using the O-arm spinal navigation I drilled the bilateral C1 lateral masses.  We probed inside the drill hole and ruled out cortical breaches.  I inserted a 3.5 mm x  24 mm partially-threaded screws into the C1 lateral masses bilaterally.  We got good bony purchase.  Then drilled the bilateral C2 pars.  Again I probed inside the drill hole dural cortical breaches.  I inserted a 3.5 x 24 mm fully threaded screw and to the C2 pars bilaterally.  We got good bony purchase.  We then connected the unilateral screws with a rod.  We secured the rod in place with the caps which we tightened appropriately.  This completed the instrumentation.  We now turned attention to the C1 to fusion.  I used a high-speed drill to decorticate the T1 and C2 lamina.  We then laid intra grow bone graft extender over these decorticated structures completing the posterior arthrodesis at C1-2.  We then obtained hemostasis using bipolar cautery.  We remove the retractor.  We then reapproximated patient's cervical thoracic fascia with 0 Vicryl suture.  We reapproximate subcutaneous tissue with interrupted 2-0 Vicryl suture.  We reapproximated the skin with Steri-Strips and benzoin.  The wound was then coated with bacitracin ointment.  A sterile dressing was applied.  The drapes were removed.  The patient was then returned to the supine position.  I then remove the [place] three-point headrest from her calvarium.  By report all sponge, instrument, and needle counts were correct at the end of this case.

## 2022-09-22 NOTE — Progress Notes (Signed)
Pt belongings at bedside include the following:  Insurance claims handler

## 2022-09-22 NOTE — Transfer of Care (Signed)
Immediate Anesthesia Transfer of Care Note  Patient: Tammy Bowers  Procedure(s) Performed: Posterior Cervical one-two Instrumentation and fusion Application of O-Arm  Patient Location: PACU  Anesthesia Type:General  Level of Consciousness: drowsy  Airway & Oxygen Therapy: Patient Spontanous Breathing and Patient connected to face mask oxygen  Post-op Assessment: Report given to RN and Post -op Vital signs reviewed and stable  Post vital signs: Reviewed and stable  Last Vitals:  Vitals Value Taken Time  BP 155/110 09/22/22 1109  Temp    Pulse 138 09/22/22 1113  Resp 25 09/22/22 1113  SpO2 100 % 09/22/22 1113  Vitals shown include unvalidated device data.  Last Pain:  Vitals:   09/22/22 0650  TempSrc:   PainSc: 0-No pain         Complications: No notable events documented.

## 2022-09-22 NOTE — Progress Notes (Signed)
Subjective: The patient was initially extubated.  By report she was somnolent but moving her lower extremities.  She was reintubated because of respiratory failure.  Presently the patient is somnolent and intubated.  She is in no apparent distress.  Objective: Vital signs in last 24 hours: Temp:  [96.6 F (35.9 C)-98.4 F (36.9 C)] 96.6 F (35.9 C) (10/05 1110) Pulse Rate:  [96-141] 96 (10/05 1145) Resp:  [15-28] 18 (10/05 1200) BP: (88-184)/(66-147) 121/107 (10/05 1200) SpO2:  [91 %-100 %] 100 % (10/05 1145) Arterial Line BP: (91-205)/(64-136) 122/114 (10/05 1200) FiO2 (%):  [100 %] 100 % (10/05 1200) Weight:  [72 kg] 72 kg (10/05 0557) Estimated body mass index is 25.62 kg/m as calculated from the following:   Height as of this encounter: 5\' 6"  (1.676 m).   Weight as of this encounter: 72 kg.   Intake/Output from previous day: No intake/output data recorded. Intake/Output this shift: Total I/O In: 1000 [I.V.:900; IV Piggyback:100] Out: 275 [Urine:150; Blood:125]  Physical exam scale coma scale 3 intubated  Lab Results: Recent Labs    09/22/22 1140  HGB 12.2  HCT 36.0   BMET Recent Labs    09/22/22 1140  NA 140  K 3.2*    Studies/Results: No results found.  Assessment/Plan: Status post C1-2 instrumentation and fusion: By report she was moving her lower extremities.  Respiratory failure: I have discussed the situation with the patient's husband.  She tells me that she has a history of COPD he also says she had a history of lung cancer which may have recurred.  This is the first I have heard of that.  Hopefully she can be extubated for a bit more time waking up from anesthesia.  We will observe her in the ICU.  LOS: 0 days     Ophelia Charter 09/22/2022, 12:22 PM

## 2022-09-22 NOTE — Anesthesia Procedure Notes (Signed)
Procedure Name: Intubation Date/Time: 09/22/2022 11:38 AM  Performed by: Lieutenant Diego, CRNAPre-anesthesia Checklist: Patient identified, Emergency Drugs available, Suction available and Patient being monitored Patient Re-evaluated:Patient Re-evaluated prior to induction Oxygen Delivery Method: Circle system utilized Preoxygenation: Pre-oxygenation with 100% oxygen Induction Type: IV induction Ventilation: Mask ventilation without difficulty Laryngoscope Size: Mac and 3 Grade View: Grade I Tube type: Oral Tube size: 7.0 mm Number of attempts: 1 Airway Equipment and Method: Stylet Placement Confirmation: ETT inserted through vocal cords under direct vision, positive ETCO2 and breath sounds checked- equal and bilateral Secured at: 21 cm Tube secured with: Tape Dental Injury: Teeth and Oropharynx as per pre-operative assessment  Comments: Poor respiratory efforts. Intubated in PACU with EF.

## 2022-09-22 NOTE — Procedures (Addendum)
Bronchoscopy Procedure Note  Tammy Bowers  [id number]  [birth year]  Date:09/22/22  Time:2:59 PM   Provider Performing:Taija Mathias S Khadijatou Borak   Procedure(s):  Flexible Bronchoscopy [phone])  Indication(s) Right mainstem bronchial obstruction  Consent Risks of the procedure as well as the alternatives and risks of each were explained to the patient and/or caregiver.  Consent for the procedure was obtained and is signed in the bedside chart her husband gave consent  Anesthesia Propofol infusing, fentanyl 50 mcg x 1   Time Out Verified patient identification, verified procedure, site/side was marked, verified correct patient position, special equipment/implants available, medications/allergies/relevant history reviewed, required imaging and test results available.   Sterile Technique Usual hand hygiene, masks, gowns, and gloves were used   Procedure Description Bronchoscope advanced through endotracheal tube and into airway.  Airways were examined down to subsegmental level with findings noted below.    Findings: Bronchoscope introduced to the endotracheal tube to the level of the main carina.  There was a large exophytic friable mass emanating and obliterating the right mainstem bronchus.  This involved the lateral wall extending up into the trachea.  There was tumor also on the lateral orifice of the left mainstem bronchus and extending down the medial surface of the left mainstem all the way into the left lower lobe orifice.  The right mainstem is completely occluded.  The bronchoscope would not pass.  The left mainstem bronchus is 20% occluded.  Bronchoscope would not pass into the left mainstem, into the left upper lobe and left lower lobe bronchi and segmental airways.  No samples were collected       Complications/Tolerance None; patient tolerated the procedure well. Chest X-ray is not needed post procedure.   EBL None   Specimen(s) None   Baltazar Apo, MD, PhD 09/22/2022,  3:02 PM [facility] [phone] or if no answer before 7:00PM call [phone] For any issues after 7:00PM please call eLink [phone]

## 2022-09-22 NOTE — Consult Note (Signed)
NAME:  Tammy Bowers, MRN:  [id number], DOB:  [birth year], LOS: 0 ADMISSION DATE:  09/22/2022, CONSULTATION DATE:  10/5 REFERRING MD:  Dr. Arnoldo Morale, CHIEF COMPLAINT:  Respiratory Failure    History of Present Illness:  71 y/o F who presented 10/5 for planned posterior C1-C2 fusion of her known C1 ring fractures and type II odontoid fracture after mechanical fall.  She has a known hx of COPD followed by Dr. Elsworth Soho and Recurrent Limited Stage Small Cell Lung Cancer previously followed by Dr. Lorenso Courier.  She elected to stop therapy in May of 2022 and had missed several oncology appointments in the interim.  She was last seen in clinic by [facility] on 09/13/21.  The patient completed Carbo/Etop with XRT x6 cycles, additional atezolizumbab x26 cycles.  She was last seen by [facility] NP in August of 2023 during a hospitalization and at that time was recommended a course of radiation for the airway obstruction if interested / if not, hospice. Baseline in wheelchair at home. Lives with her husband. Requires assistance with bathing and walking. Had been non-compliant with her cervical collar. Had frequent falls.   The patient underwent posterior cervical fusion on 10/5.  Post operative course notable for extubation failure and reintubation in the PACU.  She was reintubated per anesthesia.    PCCM consulted for assistance with ICU care.      Pertinent  Medical History  Asthma  COPD - followed by Dr. Elsworth Soho Right Hilar Mass / Small Cell - recurrent as of 10/2018, off chemo and XRT.  Followed by Palliative Care  Tobacco Abuse  Arthritis  Depression  Hypothyroidism  Kidney Stones   Significant Hospital Events: Including procedures, antibiotic start and stop dates in addition to other pertinent events   10/5 Admit for cervical spine fusion  Interim History / Subjective:  Afebrile  On vent - failed extubation in PACU  Objective   Blood pressure (!) 125/92, pulse 94, temperature (!) 96.6 F (35.9 C), resp. rate 20, height  5\' 6"  (1.676 m), weight 72 kg, SpO2 100 %.    Vent Mode: PRVC FiO2 (%):  [100 %] 100 % Set Rate:  [20 bmp] 20 bmp Vt Set:  [470 mL] 470 mL PEEP:  [5 cmH20] 5 cmH20 Plateau Pressure:  [17 cmH20] 17 cmH20   Intake/Output Summary (Last 24 hours) at 09/22/2022 1314 Last data filed at 09/22/2022 1011 Gross per 24 hour  Intake 1000 ml  Output 275 ml  Net 725 ml   Filed Weights   09/22/22 0557  Weight: 72 kg    Examination: General: chronically ill appearing adult female lying in bed in NAD HENT: MM pink/moist, ETT, anicteric, pupils =/reactive  Lungs: non-labored at rest, lungs bilaterally with coarse wheezing Cardiovascular: S1S2 RRR, no m/r/g Abdomen: soft/non-distended, bsx4 active  Extremities: warm/dry, no edema  Neuro: awake on vent, follows commands / nods yes & no     Resolved Hospital Problem list      Assessment & Plan:   Acute Respiratory Failure with Hypoxia in setting of SCLCA  Opacification of Right Hemithorax Not on currently on therapy, quit in 04/2021.  Recurrent disease.  FOB at bedside 10/5 with complete closure of right mainstem due to malignant burden. Additional findings of disease burden in the left.   -PRVC with LTVV, monitor for overdistention of left -wean PEEP / FiO2 for sats 88-94% -SBT with goal for extubation in am > will need to clarify reintubation with husband before extubation  -given findings on FOB,  and her decision not to pursue therapy, would be reasonable to transition to hospice therapy -follow intermittent CXR  -PAD protocol for RASS 0 to -1, bowel regimen with narcotics  -husband consented for bronchoscopy via phone, discussed risks/benefits   COPD  Tobacco Abuse  -brovana, pulmicort, yupelri   Cervical Spine Fusion  -post operative rec's per NSGY   HLD  -continue lipitor PT  Hypokalemia  -KCL 40 mEq PT x1   Hypothyroidism  -synthroid PT   Best Practice (right click and "Reselect all SmartList Selections" daily)   Diet/type: NPO DVT prophylaxis: SCD GI prophylaxis: PPI Lines: N/A Foley:  N/A Code Status:  full code Last date of multidisciplinary goals of care discussion: husband updated via phone 10/5 on plan of care.   Labs   CBC: Recent Labs  Lab 09/22/22 1140 09/22/22 1242  HGB 12.2 10.9*  HCT 36.0 32.0*    Basic Metabolic Panel: Recent Labs  Lab 09/22/22 1140 09/22/22 1242  NA 140 140  K 3.2* 3.1*   GFR: Estimated Creatinine Clearance: 50.4 mL/min (A) (by C-G formula based on SCr of 1.07 mg/dL (H)). No results for input(s): "PROCALCITON", "WBC", "LATICACIDVEN" in the last 168 hours.  Liver Function Tests: No results for input(s): "AST", "ALT", "ALKPHOS", "BILITOT", "PROT", "ALBUMIN" in the last 168 hours. No results for input(s): "LIPASE", "AMYLASE" in the last 168 hours. No results for input(s): "AMMONIA" in the last 168 hours.  ABG    Component Value Date/Time   PHART 7.300 (L) 09/22/2022 1242   PCO2ART 41.7 09/22/2022 1242   PO2ART 372 (H) 09/22/2022 1242   HCO3 20.5 09/22/2022 1242   TCO2 22 09/22/2022 1242   ACIDBASEDEF 6.0 (H) 09/22/2022 1242   O2SAT 100 09/22/2022 1242     Coagulation Profile: No results for input(s): "INR", "PROTIME" in the last 168 hours.  Cardiac Enzymes: No results for input(s): "CKTOTAL", "CKMB", "CKMBINDEX", "TROPONINI" in the last 168 hours.  HbA1C: No results found for: "HGBA1C"  CBG: No results for input(s): "GLUCAP" in the last 168 hours.  Review of Systems:     Unable to complete as patient is altered on mechanical ventilation post procedure.   Past Medical History:  She,  has a past medical history of Adenopathy (10/10/2016), Anemia, Arthritis, Asthma, COPD (chronic obstructive pulmonary disease) ([place]), Depression, Dyspnea, Headache, Hilar mass (10/10/2016), History of kidney stones, Hypothyroidism, Insomnia, Lung nodules (10/10/2016), Mass of both adrenal glands ([place]) (10/10/2016), Mood swing, Pneumonia, SCL CA (dx'd  2017), Spinal headache, Substance abuse ([place]), and UNSPECIFIED INFECTION OF BONE ANKLE AND FOOT (10/22/2009).   Surgical History:   Past Surgical History:  Procedure Laterality Date   ANKLE SURGERY     hardware removal, and initial ankle surgery   APPENDECTOMY     IR IMAGING GUIDED PORT INSERTION  04/12/2019   LUNG BIOPSY N/A 10/19/2016   Procedure: LUNG BIOPSY;  Surgeon: Grace Isaac, MD;  Location: [facility];  Service: Thoracic;  Laterality: N/A;   LUNG BIOPSY N/A 02/20/2019   Procedure: LUNG BIOPSY;  Surgeon: Grace Isaac, MD;  Location: [facility];  Service: Thoracic;  Laterality: N/A;   TONSILLECTOMY     VIDEO BRONCHOSCOPY WITH ENDOBRONCHIAL ULTRASOUND N/A 10/19/2016   Procedure: VIDEO BRONCHOSCOPY WITH ENDOBRONCHIAL ULTRASOUND;  Surgeon: Grace Isaac, MD;  Location: [facility];  Service: Thoracic;  Laterality: N/A;   VIDEO BRONCHOSCOPY WITH ENDOBRONCHIAL ULTRASOUND N/A 02/20/2019   Procedure: VIDEO BRONCHOSCOPY WITH ENDOBRONCHIAL ULTRASOUND;  Surgeon: Grace Isaac, MD;  Location: [facility];  Service: Thoracic;  Laterality: N/A;     Social History:   reports that she has been smoking cigarettes. She has a 12.50 pack-year smoking history. She has never used smokeless tobacco. She reports current alcohol use of about 7.0 standard drinks of alcohol per week. She reports that she does not currently use drugs.   Family History:  Her family history includes Colon cancer in her father; Heart disease in her father and paternal grandfather; Ovarian cancer in her mother.   Allergies Allergies  Allergen Reactions   Lidocaine Swelling    SWELLING REACTION UNSPECIFIED      Home Medications  Prior to Admission medications   Medication Sig Start Date End Date Taking? Authorizing Provider  acetaminophen (TYLENOL) 500 MG tablet Take 1,000 mg by mouth every 6 (six) hours as needed for moderate pain.   Yes [provider]  albuterol (PROVENTIL HFA;VENTOLIN HFA) 108 (90 BASE) MCG/ACT  inhaler Inhale 2 puffs into the lungs every 4 (four) hours as needed for wheezing.   Yes [provider]  atorvastatin (LIPITOR) 10 MG tablet Take 10 mg by mouth daily.   Yes [provider]  cyclobenzaprine (FLEXERIL) 5 MG tablet Take 5 mg by mouth 3 (three) times daily as needed for muscle spasms.   Yes [provider]  diphenoxylate-atropine (LOMOTIL) 2.5-0.025 MG tablet Take 1 tablet by mouth 4 (four) times daily as needed for diarrhea or loose stools. 03/30/19  Yes Wyatt Portela, MD  Fluticasone-Salmeterol (ADVAIR) 500-50 MCG/DOSE AEPB Inhale 1 puff into the lungs 2 (two) times daily.   Yes [provider]  levothyroxine (SYNTHROID) 125 MCG tablet Take 1 tablet (125 mcg total) by mouth daily before breakfast. 08/02/22  Yes Vann, Jessica U, DO  nystatin (MYCOSTATIN) 100000 UNIT/ML suspension Take 5 mLs (500,000 Units total) by mouth 4 (four) times daily. 07/26/22  Yes Dede Query T, PA-C  ondansetron (ZOFRAN-ODT) 8 MG disintegrating tablet Take 1 tablet (8 mg total) by mouth every 8 (eight) hours as needed for nausea or vomiting. 07/26/22  Yes Dede Query T, PA-C  OVER THE COUNTER MEDICATION Take 1 capsule by mouth daily. [place] tail-"mushroom in a capsule"   Yes [provider]  oxyCODONE-acetaminophen (PERCOCET) 7.5-325 MG tablet Take 1 tablet by mouth every 8 (eight) hours as needed for severe pain. 09/06/22  Yes [provider]  topiramate (TOPAMAX) 50 MG tablet TAKE 1 TABLET(50 MG) BY MOUTH TWICE DAILY 03/29/22  Yes Suzzanne Cloud, NP  azithromycin (ZITHROMAX) 250 MG tablet 1 PO daily Patient not taking: Reported on 09/07/2022 08/02/22   Geradine Girt, DO  oxyCODONE-acetaminophen (PERCOCET/ROXICET) 5-325 MG tablet Take 1 tablet by mouth every 6 (six) hours as needed for severe pain. Patient not taking: Reported on 09/07/2022 07/22/22   [place], Barbette Hair, MD  prochlorperazine (COMPAZINE) 10 MG tablet TAKE 1 TABLET(10 MG) BY MOUTH EVERY 6 HOURS  AS NEEDED FOR NAUSEA OR VOMITING 03/08/19   Curt Bears, MD     Critical care time: 47 minutes     Noe Gens, MSN, APRN, NP-C, AGACNP-BC [facility] 09/22/2022, 1:14 PM   Please see [email or web address] for pager details.   From 7A-7P if no response, please call [phone] After hours, please call ELink [phone]

## 2022-09-22 NOTE — IPAL (Signed)
  Interdisciplinary Goals of Care Family Meeting   Date carried out: 09/22/2022  Location of the meeting: Phone conference  Member's involved: Nurse Practitioner and Family Member or next of kin  [place] or acting medical decision maker: Fransisca Connors       Discussion: We discussed goals of care for [employer]. Reviewed findings of bronchoscopy with [name], patients husband, and implications of findings.  We discussed process of coming off mechanical ventilation with hope for success but that she likely has a very limited amount of time left.  Recommended that once extubated she not go back on mechanical ventilation or have CPR/shocks/vasopressors given the underlying non-curable condition. We reviewed the process of comfort medications and treating symptoms. He is pending visitation this evening. Will need to revisit plan of care / goals of care in am.   Code status: Full Code   Disposition: Continue current acute care  Time spent for the meeting: 40 minutes    Noe Gens, MSN, APRN, NP-C, AGACNP-BC [facility] 09/22/2022, 3:29 PM   Please see [email or web address] for pager details.   From 7A-7P if no response, please call [phone] After hours, please call ELink [phone]

## 2022-09-22 NOTE — Progress Notes (Signed)
Patient transported from PACU to 4N23. No complications. Vitals stable throughout. Report given to 4N RT's. RT will continue to monitor.

## 2022-09-23 DIAGNOSIS — S12111A Posterior displaced Type II dens fracture, initial encounter for closed fracture: Secondary | ICD-10-CM | POA: Diagnosis not present

## 2022-09-23 LAB — CBC
HCT: 36.7 % (ref 36.0–46.0)
Hemoglobin: 11.8 g/dL — ABNORMAL LOW (ref 12.0–15.0)
MCH: 31.5 pg (ref 26.0–34.0)
MCHC: 32.2 g/dL (ref 30.0–36.0)
MCV: 97.9 fL (ref 80.0–100.0)
Platelets: 365 10*3/uL (ref 150–400)
RBC: 3.75 MIL/uL — ABNORMAL LOW (ref 3.87–5.11)
RDW: 14.8 % (ref 11.5–15.5)
WBC: 14.3 10*3/uL — ABNORMAL HIGH (ref 4.0–10.5)
nRBC: 0 % (ref 0.0–0.2)

## 2022-09-23 LAB — BASIC METABOLIC PANEL
Anion gap: 13 (ref 5–15)
BUN: 21 mg/dL (ref 8–23)
CO2: 20 mmol/L — ABNORMAL LOW (ref 22–32)
Calcium: 8.7 mg/dL — ABNORMAL LOW (ref 8.9–10.3)
Chloride: 106 mmol/L (ref 98–111)
Creatinine, Ser: 1.14 mg/dL — ABNORMAL HIGH (ref 0.44–1.00)
GFR, Estimated: 52 mL/min — ABNORMAL LOW (ref >60)
Glucose, Bld: 127 mg/dL — ABNORMAL HIGH (ref 70–99)
Potassium: 4.2 mmol/L (ref 3.5–5.1)
Sodium: 139 mmol/L (ref 135–145)

## 2022-09-23 LAB — TRIGLYCERIDES: Triglycerides: 87 mg/dL (ref <150)

## 2022-09-23 MED FILL — Thrombin For Soln 5000 Unit: CUTANEOUS | Qty: 2 | Status: AC

## 2022-09-23 NOTE — IPAL (Signed)
  Interdisciplinary Goals of Care Family Meeting   Date carried out: 09/23/2022  Location of the meeting: Bedside  Member's involved: Physician and Family Member or next of kin  Durable Power of Attorney or acting medical decision maker: Fransisca Connors - husband      Discussion: We discussed goals of care for [employer]. Reviewed findings of bronchoscopy with [name], patients husband, and implications of findings.  We discussed process of coming off mechanical ventilation with hope for success but that she likely has a very limited amount of time left.  Recommended that once extubated she not go back on mechanical ventilation or have CPR/shocks/vasopressors given the underlying non-curable condition. We reviewed the process of comfort medications and treating symptoms. He agrees to DNR. Tentative plan for extubation tomorrow AM if medically ready.  Discussed if she does not do well that we would make sure she is comfortable and she would die in the hospital.  Patient's son arriving this afternoon.  Encouraged him to contact anyone who may want to see her today or tomorrow or in the coming days.  Code status: Full DNR   Disposition: Continue current acute care, does not tolerate liberation from ventilator will make comfortable and comfort care, if tolerates liberation from ventilator discussed referral to hospice which she is interested in, appreciate palliative care and hospice services available today.  Time spent for the meeting: 10 minutes    Lanier Clam, MD [facility] 09/23/2022, 1:20 PM   Please see [email or web address] for pager details.   From 7A-7P if no response, please call [phone] After hours, please call ELink [phone]

## 2022-09-23 NOTE — Progress Notes (Signed)
Manufacturing engineer (ACC)  Met with patient and spouse at bedside. Patient is currently enrolled in our palliative services but is now interested in Hospice. Plan is to see how the patient does over the weekend and make a decision of either going home with hospice vs [facility] at that time.  TOC notified. RN at bedside aware. This liaison will continue to follow throughout the weekend.  If you have any hospice related questions, feel free to call.  Clementeen Hoof, RN, [facility] [phone]

## 2022-09-23 NOTE — Progress Notes (Signed)
OT Sign Off Note  Patient Details Name: Tammy Bowers MRN: [id number] DOB: 06/22/1951   Cancelled Treatment:    Reason Eval/Treat Not Completed: Other (comment) (now hopsice comfort care) Ot will sign off at this time  Jeri Modena 09/23/2022, 2:17 PM

## 2022-09-23 NOTE — Progress Notes (Signed)
RN called d/t pt desaturating 87% and increased WOB.  Pt was placed back on full vent support, sat now 97%, no distress noted.

## 2022-09-23 NOTE — Progress Notes (Signed)
[place] [facility]) Hospital Liaison note:  This patient is currently enrolled in [facility] outpatient-based Palliative Care. Will continue to follow for disposition.  Please call with any outpatient palliative questions or concerns.  Thank you, Lorelee Market, LPN [facility] Liaison [phone]

## 2022-09-23 NOTE — Progress Notes (Signed)
Subjective: The patient is sedated and mildly somnolent.  She is in no apparent distress.  Objective: Vital signs in last 24 hours: Temp:  [96.6 F (35.9 C)-99.2 F (37.3 C)] 99.1 F (37.3 C) (10/06 0400) Pulse Rate:  [85-141] 111 (10/06 0600) Resp:  [0-28] 20 (10/06 0600) BP: (88-184)/(66-147) 123/101 (10/06 0600) SpO2:  [91 %-100 %] 97 % (10/06 0600) Arterial Line BP: (91-205)/(64-136) 135/91 (10/05 2100) FiO2 (%):  [40 %-100 %] 40 % (10/06 0258) Estimated body mass index is 25.62 kg/m as calculated from the following:   Height as of this encounter: 5\' 6"  (1.676 m).   Weight as of this encounter: 72 kg.   Intake/Output from previous day: 10/05 0701 - 10/06 0700 In: 2433.3 [I.V.:2133.4; IV Piggyback:299.9] Out: 815 [Urine:690; Blood:125] Intake/Output this shift: Total I/O In: 1026.7 [I.V.:926.7; IV Piggyback:100] Out: 225 [Urine:225]  Physical exam patient is somnolent but arousable.  She follows commands and moves all 4 extremities.  Lab Results: Recent Labs    09/22/22 1140 09/22/22 1242  HGB 12.2 10.9*  HCT 36.0 32.0*   BMET Recent Labs    09/22/22 1140 09/22/22 1242  NA 140 140  K 3.2* 3.1*    Studies/Results: DG Cervical Spine 1 View  Result Date: 09/22/2022 CLINICAL DATA:  Fluoroscopic assistance for cervical spine surgery EXAM: DG CERVICAL SPINE - 1 VIEW COMPARISON:  08/23/2022 FINDINGS: Fractures are seen in the posterior aspects of C1 vertebra on both sides. Postfusion images are not available for review. Fluoroscopic time 5 seconds. Radiation dose 0.46 mGy. IMPRESSION: Fluoroscopic assistance was provided for cervical spine surgery. Electronically Signed   By: Elmer Picker M.D.   On: 09/22/2022 16:46   DG O-ARM IMAGE ONLY/NO REPORT  Result Date: 09/22/2022 There is no Radiologist interpretation  for this exam.  DG C-Arm 1-60 Min-No Report  Result Date: 09/22/2022 Fluoroscopy was utilized by the requesting physician.  No radiographic  interpretation.   DG Abd Portable 1V  Result Date: 09/22/2022 CLINICAL DATA:  Enteric catheter placement EXAM: PORTABLE ABDOMEN - 1 VIEW COMPARISON:  07/30/2022 FINDINGS: Frontal view of the lower chest and upper abdomen demonstrates enteric catheter passing below diaphragm, tip and side port projecting over the gastric body. Distal extent of a right chest wall port is identified overlying superior vena cava. Complete opacification of the right hemithorax with volume loss, consistent with known right hilar mass, right lung atelectasis, and pleural effusion. IMPRESSION: 1. Enteric catheter tip projecting over the gastric body. Electronically Signed   By: Randa Ngo M.D.   On: 09/22/2022 16:21   DG CHEST PORT 1 VIEW  Result Date: 09/22/2022 CLINICAL DATA:  Intubation. EXAM: PORTABLE CHEST 1 VIEW COMPARISON:  July 30, 2022. FINDINGS: Endotracheal tube is in grossly good position. Stable position of right internal jugular Port-A-Cath. There is now noted complete opacification of the right hemithorax with some degree of mediastinal shift to the right suggesting combination of pleural effusion and atelectasis. IMPRESSION: Endotracheal tube in grossly good position. Complete opacification of right hemithorax is now noted with some degree of mediastinal shift to the right suggesting combination of pleural effusion and atelectasis. Electronically Signed   By: Marijo Conception M.D.   On: 09/22/2022 12:39    Assessment/Plan: Postop day #1 status post C1-2 posterior instrumentation fusion: The patient is doing well neurologically.  Respiratory failure, lung cancer: I appreciate critical care's management of these problems.  Hopefully she can be weaned and extubated with time.  LOS: 1 day  Ophelia Charter 09/23/2022, 6:51 AM

## 2022-09-23 NOTE — Progress Notes (Signed)
  Transition of Care [facility]) Screening Note   Patient Details  Name: Tammy Bowers Date of Birth: 01-04-1951   Transition of Care [facility]) CM/SW Contact:    Benard Halsted, LCSW Phone Number: 09/23/2022, 5:15 PM    Transition of Care Department [facility]) has reviewed patient and acknowledges potential need for Hospice services. We will continue to monitor patient advancement through interdisciplinary progression rounds. If new patient transition needs arise, please place a TOC consult.

## 2022-09-23 NOTE — Progress Notes (Signed)
NAME:  Tammy Bowers, MRN:  [id number], DOB:  [birth year], LOS: 1 ADMISSION DATE:  09/22/2022, CONSULTATION DATE:  10/5 REFERRING MD:  Dr. Arnoldo Morale, CHIEF COMPLAINT:  Respiratory Failure    History of Present Illness:  71 y/o F who presented 10/5 for planned posterior C1-C2 fusion of her known C1 ring fractures and type II odontoid fracture after mechanical fall.  She has a known hx of COPD followed by Dr. Elsworth Soho and Recurrent Limited Stage Small Cell Lung Cancer previously followed by Dr. Lorenso Courier.  She elected to stop therapy in May of 2022 and had missed several oncology appointments in the interim.  She was last seen in clinic by [place] on 09/13/21.  The patient completed Carbo/Etop with XRT x6 cycles, additional atezolizumbab x26 cycles.  She was last seen by [place] NP in August of 2023 during a hospitalization and at that time was recommended a course of radiation for the airway obstruction if interested / if not, hospice. Baseline in wheelchair at home. Lives with her husband. Requires assistance with bathing and walking. Had been non-compliant with her cervical collar. Had frequent falls.   The patient underwent posterior cervical fusion on 10/5.  Post operative course notable for extubation failure and reintubation in the PACU.  She was reintubated per anesthesia.    PCCM consulted for assistance with ICU care.      Pertinent  Medical History  Asthma  COPD - followed by Dr. Elsworth Soho Right Hilar Mass / Small Cell - recurrent as of 10/2018, off chemo and XRT.  Followed by Palliative Care  Tobacco Abuse  Arthritis  Depression  Hypothyroidism  Kidney Stones   Significant Hospital Events: Including procedures, antibiotic start and stop dates in addition to other pertinent events   10/5 Admit for cervical spine fusion 09/22/2022 fiberoptic bronchoscopy is unable to pass scope beyond bilateral right and left mainstem masses  Interim History / Subjective:  Remains intubated and sedated  Objective    Blood pressure (!) 123/101, pulse (!) 111, temperature 98.8 F (37.1 C), temperature source Axillary, resp. rate 20, height 5\' 6"  (1.676 m), weight 72 kg, SpO2 95 %.    Vent Mode: PRVC FiO2 (%):  [40 %-100 %] 40 % Set Rate:  [20 bmp] 20 bmp Vt Set:  [470 mL] 470 mL PEEP:  [5 cmH20] 5 cmH20 Plateau Pressure:  [16 cmH20-20 cmH20] 20 cmH20   Intake/Output Summary (Last 24 hours) at 09/23/2022 2130 Last data filed at 09/23/2022 0600 Gross per 24 hour  Intake 2333.27 ml  Output 815 ml  Net 1518.27 ml   Filed Weights   09/22/22 0557  Weight: 72 kg    Examination: General: Elderly female on vent Heavy sedation HEENT: MM pink/moist #7 endotracheal tube is in place no JVD or lymphadenopathy Neuro: Currently on heavy propofol drip along with IV fentanyl   CV: Sinus tachycardia PULM: Coarse rhonchi bilaterally Vent PRVC mode FIO2 40% PEEP 5 RATE 20 VT 470  GI: soft, bsx4 active  GU: Amber urine Extremities: warm/dry, negative edema  Skin: no rashes or lesions     Resolved Hospital Problem list      Assessment & Plan:   Acute Respiratory Failure with Hypoxia in setting of SCLCA  Opacification of Right Hemithorax Fiberoptic bronchoscopy 09/22/2022 revealed mass right mainstem left mainstem and unable to pass bronchoscope.  She most likely cannot survive this was neither extubate to full comfort care with BiPAP or withdrawal care.  COPD  Tobacco Abuse  Pulmonary:  Cervical Spine  Fusion  Per neurosurgery   Hypokalemia  Recent Labs  Lab 09/22/22 1140 09/22/22 1242  K 3.2* 3.1*   Replete as needed  Hypothyroidism  Synthroid  Best Practice (right click and "Reselect all SmartList Selections" daily)  Diet/type: NPO DVT prophylaxis: SCD GI prophylaxis: PPI Lines: N/A Foley:  N/A Code Status:  full code Last date of multidisciplinary goals of care discussion: husband updated via phone 10/5 on plan of care.   Labs   CBC: Recent Labs  Lab 09/22/22 1140  09/22/22 1242 09/23/22 0651  WBC  --   --  14.3*  HGB 12.2 10.9* 11.8*  HCT 36.0 32.0* 36.7  MCV  --   --  97.9  PLT  --   --  875    Basic Metabolic Panel: Recent Labs  Lab 09/22/22 1140 09/22/22 1242  NA 140 140  K 3.2* 3.1*   GFR: Estimated Creatinine Clearance: 50.4 mL/min (A) (by C-G formula based on SCr of 1.07 mg/dL (H)). Recent Labs  Lab 09/23/22 0651  WBC 14.3*    Liver Function Tests: No results for input(s): "AST", "ALT", "ALKPHOS", "BILITOT", "PROT", "ALBUMIN" in the last 168 hours. No results for input(s): "LIPASE", "AMYLASE" in the last 168 hours. No results for input(s): "AMMONIA" in the last 168 hours.  ABG    Component Value Date/Time   PHART 7.300 (L) 09/22/2022 1242   PCO2ART 41.7 09/22/2022 1242   PO2ART 372 (H) 09/22/2022 1242   HCO3 20.5 09/22/2022 1242   TCO2 22 09/22/2022 1242   ACIDBASEDEF 6.0 (H) 09/22/2022 1242   O2SAT 100 09/22/2022 1242     Coagulation Profile: No results for input(s): "INR", "PROTIME" in the last 168 hours.  Cardiac Enzymes: No results for input(s): "CKTOTAL", "CKMB", "CKMBINDEX", "TROPONINI" in the last 168 hours.  HbA1C: No results found for: "HGBA1C"  CBG: No results for input(s): "GLUCAP" in the last 168 hours.    Critical care time: 33 minutes     Richardson Landry Inessa Wardrop ACNP Acute Care Nurse Practitioner [place] Please consult Amion 09/23/2022, 8:53 AM

## 2022-09-24 ENCOUNTER — Inpatient Hospital Stay (HOSPITAL_COMMUNITY): Payer: Medicare Other

## 2022-09-24 DIAGNOSIS — S12111A Posterior displaced Type II dens fracture, initial encounter for closed fracture: Secondary | ICD-10-CM | POA: Diagnosis not present

## 2022-09-24 MED ORDER — LORAZEPAM 2 MG/ML IJ SOLN: 2.0000 mg | INTRAMUSCULAR | Status: DC | PRN

## 2022-09-24 MED ORDER — ONDANSETRON 4 MG PO TBDP: 4.0000 mg | ORAL_TABLET | Freq: Four times a day (QID) | ORAL | Status: DC | PRN

## 2022-09-24 MED ORDER — MORPHINE 100MG IN NS 100ML (1MG/ML) PREMIX INFUSION
0.0000 mg/h | INTRAVENOUS | Status: DC
  Filled 2022-09-24: qty 100

## 2022-09-24 MED ORDER — ONDANSETRON HCL 4 MG/2ML IJ SOLN: 4.0000 mg | Freq: Four times a day (QID) | INTRAMUSCULAR | Status: DC | PRN

## 2022-09-24 MED ORDER — FUROSEMIDE 10 MG/ML IJ SOLN
40.0000 mg | Freq: Once | INTRAMUSCULAR | Status: AC
  Administered 2022-09-24: 40 mg via INTRAVENOUS
  Filled 2022-09-24: qty 4

## 2022-09-24 MED ORDER — ORAL CARE MOUTH RINSE
15.0000 mL | OROMUCOSAL | Status: DC
  Administered 2022-09-24 – 2022-09-26 (×7): 15 mL via OROMUCOSAL

## 2022-09-24 MED ORDER — POLYVINYL ALCOHOL 1.4 % OP SOLN: 1.0000 [drp] | Freq: Four times a day (QID) | OPHTHALMIC | Status: DC | PRN

## 2022-09-24 MED ORDER — SODIUM CHLORIDE 0.9 % IV SOLN: INTRAVENOUS | Status: DC

## 2022-09-24 MED ORDER — GLYCOPYRROLATE 1 MG PO TABS: 1.0000 mg | ORAL_TABLET | ORAL | Status: DC | PRN

## 2022-09-24 MED ORDER — METOPROLOL TARTRATE 5 MG/5ML IV SOLN
5.0000 mg | Freq: Once | INTRAVENOUS | Status: AC
  Administered 2022-09-24: 5 mg via INTRAVENOUS

## 2022-09-24 MED ORDER — ORAL CARE MOUTH RINSE: 15.0000 mL | OROMUCOSAL | Status: DC | PRN

## 2022-09-24 MED ORDER — GLYCOPYRROLATE 0.2 MG/ML IJ SOLN
0.2000 mg | INTRAMUSCULAR | Status: DC | PRN
  Administered 2022-09-24 – 2022-09-26 (×7): 0.2 mg via INTRAVENOUS
  Filled 2022-09-24 (×7): qty 1

## 2022-09-24 MED ORDER — MORPHINE SULFATE (PF) 4 MG/ML IV SOLN
4.0000 mg | INTRAVENOUS | Status: DC | PRN
  Administered 2022-09-24 (×6): 4 mg via INTRAVENOUS
  Filled 2022-09-24 (×6): qty 1

## 2022-09-24 MED ORDER — ACETAMINOPHEN 650 MG RE SUPP: 650.0000 mg | Freq: Four times a day (QID) | RECTAL | Status: DC | PRN

## 2022-09-24 MED ORDER — GLYCOPYRROLATE 0.2 MG/ML IJ SOLN
0.2000 mg | INTRAMUSCULAR | Status: DC | PRN
  Administered 2022-09-26: 0.2 mg via SUBCUTANEOUS
  Filled 2022-09-24: qty 1

## 2022-09-24 MED ORDER — ACETAMINOPHEN 325 MG PO TABS: 650.0000 mg | ORAL_TABLET | Freq: Four times a day (QID) | ORAL | Status: DC | PRN

## 2022-09-24 MED ORDER — METOPROLOL TARTRATE 5 MG/5ML IV SOLN
INTRAVENOUS | Status: AC
  Filled 2022-09-24: qty 5

## 2022-09-24 NOTE — Progress Notes (Signed)
Patient's spo2 dropped to 87%. Pt placed on PRVC mode at this time.

## 2022-09-24 NOTE — Progress Notes (Signed)
NAME:  Tammy Bowers, MRN:  [id number], DOB:  1951/04/08, LOS: 2 ADMISSION DATE:  09/22/2022, CONSULTATION DATE:  10/5 REFERRING MD:  Dr. Arnoldo Morale, CHIEF COMPLAINT:  Respiratory Failure    History of Present Illness:  71 y/o F who presented 10/5 for planned posterior C1-C2 fusion of her known C1 ring fractures and type II odontoid fracture after mechanical fall.  She has a known hx of COPD followed by Dr. Elsworth Soho and Recurrent Limited Stage Small Cell Lung Cancer previously followed by Dr. Lorenso Courier.  She elected to stop therapy in May of 2022 and had missed several oncology appointments in the interim.  She was last seen in clinic by [place] on 09/13/21.  The patient completed Carbo/Etop with XRT x6 cycles, additional atezolizumbab x26 cycles.  She was last seen by [place] NP in August of 2023 during a hospitalization and at that time was recommended a course of radiation for the airway obstruction if interested / if not, hospice. Baseline in wheelchair at home. Lives with her husband. Requires assistance with bathing and walking. Had been non-compliant with her cervical collar. Had frequent falls.   The patient underwent posterior cervical fusion on 10/5.  Post operative course notable for extubation failure and reintubation in the PACU.  She was reintubated per anesthesia.    PCCM consulted for assistance with ICU care.      Pertinent  Medical History  Asthma  COPD - followed by Dr. Elsworth Soho Right Hilar Mass / Small Cell - recurrent as of 10/2018, off chemo and XRT.  Followed by Palliative Care  Tobacco Abuse  Arthritis  Depression  Hypothyroidism  Kidney Stones   Significant Hospital Events: Including procedures, antibiotic start and stop dates in addition to other pertinent events   10/5 Admit for cervical spine fusion 09/22/2022 fiberoptic bronchoscopy is unable to pass scope beyond bilateral right and left mainstem masses 10/6 weaned on PSV  Interim History / Subjective:  Remains intubated and  sedated. Did on on PSV 5/5 but then desaturated requiring full support.  Objective   Blood pressure 116/86, pulse (!) 119, temperature 98.2 F (36.8 C), temperature source Axillary, resp. rate (!) 27, height 5\' 6"  (1.676 m), weight 72 kg, SpO2 91 %.    Vent Mode: PRVC FiO2 (%):  [40 %-100 %] 100 % Set Rate:  [20 bmp] 20 bmp Vt Set:  [470 mL] 470 mL PEEP:  [5 cmH20] 5 cmH20 Pressure Support:  [5 cmH20] 5 cmH20 Plateau Pressure:  [16 cmH20] 16 cmH20   Intake/Output Summary (Last 24 hours) at 09/24/2022 0913 Last data filed at 09/24/2022 0900 Gross per 24 hour  Intake 2465.89 ml  Output 750 ml  Net 1715.89 ml    Filed Weights   09/22/22 0557  Weight: 72 kg    Examination: General: Elderly female on vent HEENT: MM pink/moist #7 endotracheal tube is in place no JVD or lymphadenopathy Neuro: MAE, not reliably following commands on propofol CV: Sinus tachycardia PULM: Coarse rhonchi on L, diminished on right GI: soft, bsx4 active  GU: Amber urine Extremities: warm/dry, negative edema  Skin: no rashes or lesions     Resolved Hospital Problem list      Assessment & Plan:   Mechanical ventilator dependence due to postoperative state as well as hypoxemic respiratory failure due to total lung atelectasis and small cell lung cancer: -- PRVC, PSV as tolerated, wean sedation --IV lasix  -- We will work to extubate from ventilator will be extremely difficult given she has single  lung ventilation with total occlusion of the right lung as well as left mainstem obstruction, high likelihood of respiratory decompensation, DNR, may pursue comfort care   History of COPD: Followed in pulmonary clinic. -- Continue ICS/LAMA/LABA therapy via nebulizers   History of small cell lung cancer: Last therapy May 2022.  Decided not to pursue further therapy at that time when there was progression of disease.  She declined palliative radiation in August 2023 due to bulky tumor burden and airway  compromise. -- Goals of care ongoing   Hypothyroidism: Continue levothyroxine  Cervical spine fracture following fall: --appreciate NSG assitance   Best Practice (right click and "Reselect all SmartList Selections" daily)  Diet/type: NPO DVT prophylaxis: SCD GI prophylaxis: PPI Lines: N/A Foley:  N/A Code Status:  full code Last date of multidisciplinary goals of care discussion: husband updated via phone 10/5 on plan of care.   Labs   CBC: Recent Labs  Lab 09/22/22 1140 09/22/22 1242 09/23/22 0651  WBC  --   --  14.3*  HGB 12.2 10.9* 11.8*  HCT 36.0 32.0* 36.7  MCV  --   --  97.9  PLT  --   --  365     Basic Metabolic Panel: Recent Labs  Lab 09/22/22 1140 09/22/22 1242 09/23/22 0651  NA 140 140 139  K 3.2* 3.1* 4.2  CL  --   --  106  CO2  --   --  20*  GLUCOSE  --   --  127*  BUN  --   --  21  CREATININE  --   --  1.14*  CALCIUM  --   --  8.7*    GFR: Estimated Creatinine Clearance: 47.4 mL/min (A) (by C-G formula based on SCr of 1.14 mg/dL (H)). Recent Labs  Lab 09/23/22 0651  WBC 14.3*     Liver Function Tests: No results for input(s): "AST", "ALT", "ALKPHOS", "BILITOT", "PROT", "ALBUMIN" in the last 168 hours. No results for input(s): "LIPASE", "AMYLASE" in the last 168 hours. No results for input(s): "AMMONIA" in the last 168 hours.  ABG    Component Value Date/Time   PHART 7.300 (L) 09/22/2022 1242   PCO2ART 41.7 09/22/2022 1242   PO2ART 372 (H) 09/22/2022 1242   HCO3 20.5 09/22/2022 1242   TCO2 22 09/22/2022 1242   ACIDBASEDEF 6.0 (H) 09/22/2022 1242   O2SAT 100 09/22/2022 1242     Coagulation Profile: No results for input(s): "INR", "PROTIME" in the last 168 hours.  Cardiac Enzymes: No results for input(s): "CKTOTAL", "CKMB", "CKMBINDEX", "TROPONINI" in the last 168 hours.  HbA1C: No results found for: "HGBA1C"  CBG: No results for input(s): "GLUCAP" in the last 168 hours.    Critical care time:    CRITICAL  CARE Performed by: Bonna Gains Diante Barley   Total critical care time: 35 minutes  Critical care time was exclusive of separately billable procedures and treating other patients.  Critical care was necessary to treat or prevent imminent or life-threatening deterioration.  Critical care was time spent personally by me on the following activities: development of treatment plan with patient and/or surrogate as well as nursing, discussions with consultants, evaluation of patient's response to treatment, examination of patient, obtaining history from patient or surrogate, ordering and performing treatments and interventions, ordering and review of laboratory studies, ordering and review of radiographic studies, pulse oximetry and re-evaluation of patient's condition.  Lanier Clam, MD [facility] Pulmonary/Critical Care Please consult Amion 09/24/2022, 9:13 AM

## 2022-09-24 NOTE — Progress Notes (Signed)
Patient's spo2 decreased to 82% consistently with good waveform. RT increased Fio2 to 100%. Spo2 currently 91%. RT will continue to monitor.

## 2022-09-24 NOTE — Procedures (Signed)
Extubation Procedure Note  Patient Details:   Name: Tammy Bowers DOB: 1951/10/11 MRN: [id number]   Airway Documentation:    Vent end date: 09/24/22 Vent end time: 1120   Evaluation  O2 sats: stable throughout Complications: No apparent complications Patient did tolerate procedure well. Bilateral Breath Sounds: Rhonchi, Diminished   No  Patient extubated per MD order. Pt placed on 10L [place] Nasal Cannula. Spo2 98%. No complications noted.  Patsy Baltimore Canuto Kingston 09/24/2022, 11:26 AM

## 2022-09-24 NOTE — Progress Notes (Signed)
   Providing Compassionate, Quality Care - Together  NEUROSURGERY PROGRESS NOTE   S: No issues overnight.  Remains intubated, weaning for extubation per CCM  O: EXAM:  BP 116/86   Pulse (!) 119   Temp 98.2 F (36.8 C) (Axillary)   Resp (!) 27   Ht 5\' 6"  (1.676 m)   Wt 72 kg   SpO2 91%   BMI 25.62 kg/m   Intubated, sedated Eyes open to pain Pupils equally round react to light Moves all extremities to pain equally Incision clean dry and intact  ASSESSMENT:  71 y.o. female with   C1-2 fracture  -Status post posterior C1-2 instrumentation and arthrodesis  PLAN: -Continue supportive care, wean vent per CCM -Pain control -Patient's family now interested in hospice, they are continuing to follow along    Thank you for allowing me to participate in this patient's care.  Please do not hesitate to call with questions or concerns.   Elwin Sleight, [facility] Cell: [phone]

## 2022-09-24 NOTE — Progress Notes (Signed)
[place] Progress Note Patient Name: Tammy Bowers DOB: 11/22/1951 MRN: [id number]   Date of Service  09/24/2022  HPI/Events of Note  Patient is on comfort care. Nursing request to transfer patient to paliative care bed to free up ICU bed for new ICU admissions.  eICU Interventions  Plan: Transfer to palliative care bed.  D/C cardiac monitoring.     Intervention Category Major Interventions: Other:  Lysle Dingwall 09/24/2022, 8:32 PM

## 2022-09-24 NOTE — Progress Notes (Signed)
RT called to room due to patient desaturating to 78%, patient taken off the vent and bagged lavaged and several mucous plugs were removed.  Patient oxygen saturation improved and is currently tolerating well.

## 2022-09-24 NOTE — IPAL (Addendum)
  Interdisciplinary Goals of Care Family Meeting   Date carried out: 09/24/2022  Location of the meeting: Bedside  Member's involved: Physician, Bedside Registered Nurse, and Family Member or next of kin including husband, 2 sons, granddaughter  Durable Power of Attorney or acting medical decision maker: Tammy Bowers - husband      Discussion: We discussed goals of care for Tammy Bowers. Reviewed findings of bronchoscopy with Tammy Bowers, patients husband, and implications of findings.  We discussed process of coming off mechanical ventilation with hope for success but that she likely has a very limited amount of time left.  Unfortunately desaturation with SBT this morning.  Developed A-fib with RVR/SVT.  Poor prognostic signs.  Shared with family at bedside.  They states she would not want to remain on life support.  They are hopeful she will build to breathe on her own and sustained to be able to leave the hospital.  Gust that while I hope this is true I think more likely that she will struggle and would need to pursue comfort care.  They expressed understanding and are in agreement with this.  We will plan for extubation.  If she is in distress we will make her comfortable which was explained in detail and they agree.  If she does well we will pursue placement in hospice if able to be discharged from the hospital.  Regardless of her respiratory status, good or bad, we will pursue comfort care and stop other medications that are not expressly for her comfort.  Husband expressed understanding and is in agreement.  Code status: Full DNR  Disposition: Home with Hospice vs inpatient comfort care Time spent for the meeting: 15 minutes    Lanier Clam, MD  Pulmonary & Critical Care 09/24/2022, 11:01 AM  Please see [email or web address] for contact info From 7A-7P if no response, please call [phone] After hours, please call ELink [phone]

## 2022-09-24 NOTE — Progress Notes (Signed)
[facility])  Patient is currently our palliative patient seeking hospice care for EOL. Patient was extubated this am and will continue to monitor until Monday and assess at that time for placement of either [facility] place or home with hospice.   Please feel free to call with any questions.  Clementeen Hoof, RN, [facility] [phone]

## 2022-09-24 NOTE — Plan of Care (Signed)
  Problem: Clinical Measurements: Goal: Cardiovascular complication will be avoided Outcome: Progressing   Problem: Coping: Goal: Level of anxiety will decrease Outcome: Progressing   Problem: Elimination: Goal: Will not experience complications related to urinary retention Outcome: Progressing   Problem: Pain Managment: Goal: General experience of comfort will improve Outcome: Progressing   Problem: Safety: Goal: Ability to remain free from injury will improve Outcome: Progressing   Problem: Activity: Goal: Will remain free from falls Outcome: Progressing   Problem: Clinical Measurements: Goal: Ability to maintain clinical measurements within normal limits will improve Outcome: Progressing Goal: Postoperative complications will be avoided or minimized Outcome: Progressing   Problem: Pain Management: Goal: Pain level will decrease Outcome: Progressing   Problem: Skin Integrity: Goal: Will show signs of wound healing Outcome: Progressing   Problem: Bladder/Genitourinary: Goal: Urinary functional status for postoperative course will improve Outcome: Progressing

## 2022-09-25 DIAGNOSIS — S12111A Posterior displaced Type II dens fracture, initial encounter for closed fracture: Secondary | ICD-10-CM | POA: Diagnosis not present

## 2022-09-25 MED ORDER — FUROSEMIDE 10 MG/ML IJ SOLN
40.0000 mg | Freq: Once | INTRAMUSCULAR | Status: AC
  Administered 2022-09-25: 40 mg via INTRAVENOUS
  Filled 2022-09-25: qty 4

## 2022-09-25 MED ORDER — ATROPINE SULFATE 1 % OP SOLN
4.0000 [drp] | OPHTHALMIC | Status: DC | PRN
  Administered 2022-09-25 – 2022-09-26 (×3): 4 [drp] via SUBLINGUAL
  Filled 2022-09-25: qty 2

## 2022-09-25 NOTE — Progress Notes (Signed)
Pt was received at 2330 last night from unit. Alert, difficulty speaking, moving all extremities, foley intact, does not appear to be in discomfort at this time. No labored breathing noted. Husband at bedside.

## 2022-09-25 NOTE — Progress Notes (Signed)
Instead of calling a funeral home when patient passes away, nursing is to let [facility] know that patient has passed.

## 2022-09-25 NOTE — Progress Notes (Signed)
NAME:  Tammy Bowers, MRN:  [id number], DOB:  1951/02/23, LOS: 3 ADMISSION DATE:  09/22/2022, CONSULTATION DATE:  10/5 REFERRING MD:  Dr. Arnoldo Morale, CHIEF COMPLAINT:  Respiratory Failure    History of Present Illness:  71 y/o F who presented 10/5 for planned posterior C1-C2 fusion of her known C1 ring fractures and type II odontoid fracture after mechanical fall.  She has a known hx of COPD followed by Dr. Elsworth Soho and Recurrent Limited Stage Small Cell Lung Cancer previously followed by Dr. Lorenso Courier.  She elected to stop therapy in May of 2022 and had missed several oncology appointments in the interim.  She was last seen in clinic by [facility] on 09/13/21.  The patient completed Carbo/Etop with XRT x6 cycles, additional atezolizumbab x26 cycles.  She was last seen by [place] NP in August of 2023 during a hospitalization and at that time was recommended a course of radiation for the airway obstruction if interested / if not, hospice. Baseline in wheelchair at home. Lives with her husband. Requires assistance with bathing and walking. Had been non-compliant with her cervical collar. Had frequent falls.   The patient underwent posterior cervical fusion on 10/5.  Post operative course notable for extubation failure and reintubation in the PACU.  She was reintubated per anesthesia.    PCCM consulted for assistance with ICU care.      Pertinent  Medical History  Asthma  COPD - followed by Dr. Elsworth Soho Right Hilar Mass / Small Cell - recurrent as of 10/2018, off chemo and XRT.  Followed by Palliative Care  Tobacco Abuse  Arthritis  Depression  Hypothyroidism  Kidney Stones   Significant Hospital Events: Including procedures, antibiotic start and stop dates in addition to other pertinent events   10/5 Admit for cervical spine fusion 09/22/2022 fiberoptic bronchoscopy is unable to pass scope beyond bilateral right and left mainstem masses 10/6 weaned on PSV One-way extubation 09/24/2022  Interim History / Subjective:   Extubated no acute distress  Objective   Blood pressure 101/85, pulse (!) 108, temperature 98.5 F (36.9 C), temperature source Axillary, resp. rate 14, height 5\' 6"  (1.676 m), weight 72 kg, SpO2 90 %.        Intake/Output Summary (Last 24 hours) at 09/25/2022 0944 Last data filed at 09/24/2022 2200 Gross per 24 hour  Intake 399.07 ml  Output 300 ml  Net 99.07 ml   Filed Weights   09/22/22 0557  Weight: 72 kg    Examination: General: Frail elderly female no acute distress HEENT: MM pink/moist no JVD Neuro: Opens eyes to stimulation does not follow commands CV: Heart sounds regular PULM: Coarse rhonchi, shallow respirations GI: soft, bsx4 active  GU: Amber urine Extremities: warm/dry, 1+ edema  Skin: no rashes or lesions      Resolved Hospital Problem list      Assessment & Plan:   Mechanical ventilator dependence due to postoperative state as well as hypoxemic respiratory failure due to total lung atelectasis and small cell lung cancer:  History of COPD: Followed in pulmonary clinic.  Continue bronchodilators as needed Pursuing hospice at this time possibly [facility] No aggressive treatment for cancer is planned  Pulmonary critical care will sign off at this time  Best Practice (right click and "Reselect all SmartList Selections" daily)  Diet/type: NPO DVT prophylaxis: SCD GI prophylaxis: PPI Lines: N/A Foley:  N/A Code Status:  full code Last date of multidisciplinary goals of care discussion: husband updated via phone 10/5 on plan of care.  Labs   CBC: Recent Labs  Lab 09/22/22 1140 09/22/22 1242 09/23/22 0651  WBC  --   --  14.3*  HGB 12.2 10.9* 11.8*  HCT 36.0 32.0* 36.7  MCV  --   --  97.9  PLT  --   --  962    Basic Metabolic Panel: Recent Labs  Lab 09/22/22 1140 09/22/22 1242 09/23/22 0651  NA 140 140 139  K 3.2* 3.1* 4.2  CL  --   --  106  CO2  --   --  20*  GLUCOSE  --   --  127*  BUN  --   --  21  CREATININE  --   --   1.14*  CALCIUM  --   --  8.7*   GFR: Estimated Creatinine Clearance: 47.4 mL/min (A) (by C-G formula based on SCr of 1.14 mg/dL (H)). Recent Labs  Lab 09/23/22 0651  WBC 14.3*    Liver Function Tests: No results for input(s): "AST", "ALT", "ALKPHOS", "BILITOT", "PROT", "ALBUMIN" in the last 168 hours. No results for input(s): "LIPASE", "AMYLASE" in the last 168 hours. No results for input(s): "AMMONIA" in the last 168 hours.  ABG    Component Value Date/Time   PHART 7.300 (L) 09/22/2022 1242   PCO2ART 41.7 09/22/2022 1242   PO2ART 372 (H) 09/22/2022 1242   HCO3 20.5 09/22/2022 1242   TCO2 22 09/22/2022 1242   ACIDBASEDEF 6.0 (H) 09/22/2022 1242   O2SAT 100 09/22/2022 1242     Coagulation Profile: No results for input(s): "INR", "PROTIME" in the last 168 hours.  Cardiac Enzymes: No results for input(s): "CKTOTAL", "CKMB", "CKMBINDEX", "TROPONINI" in the last 168 hours.  HbA1C: No results found for: "HGBA1C"  CBG: No results for input(s): "GLUCAP" in the last 168 hours.    Richardson Landry Neriah Brott ACNP Acute Care Nurse Practitioner [place] Please consult Amion 09/25/2022, 9:44 AM

## 2022-09-25 NOTE — Progress Notes (Addendum)
Manufacturing engineer (ACC)  UPDATE: After speaking with spouse and family they would like to go to [facility] for EOL. [place] liaison will coordinate tomorrow and follow up with TOC.    Patient is currently our palliative patient seeking hospice care for EOL. Patient was extubated Saturday morning. Full comfort care. Patient alert, but has some difficulty speaking. Able to follow you around the room. Spoke with husband who would like to discuss with family before making the decision to go to [facility] on Monday. Will update team as soon as possible.    Please feel free to call with any questions.   Clementeen Hoof, RN, [facility] [phone]

## 2022-09-25 NOTE — Progress Notes (Signed)
Pt slept at long intervals tonight. Pt doing self mouth care with sponges. Will continue to monitor. Husband remains at bedside.

## 2022-09-25 NOTE — Progress Notes (Signed)
Patient has now been moved to palliative care, possibly seeking hospice care.  Currently she is extubated, nonlabored breathing, eyes open, tracking along the room.  Noncommunicative however.  I discussed with her husband at bedside, expressed my condolences.  Continue palliative care, currently they are looking for [facility] place or home with hospice.   Thank you for allowing me to participate in this patient's care.  Please do not hesitate to call with questions or concerns.   Elwin Sleight, [facility]

## 2022-09-26 ENCOUNTER — Encounter (HOSPITAL_COMMUNITY): Payer: Self-pay | Admitting: Neurosurgery

## 2022-09-26 ENCOUNTER — Other Ambulatory Visit: Payer: Self-pay

## 2022-09-26 ENCOUNTER — Other Ambulatory Visit (HOSPITAL_COMMUNITY): Payer: Self-pay

## 2022-09-26 ENCOUNTER — Other Ambulatory Visit: Payer: Self-pay | Admitting: Obstetrics and Gynecology

## 2022-09-26 DIAGNOSIS — S12111A Posterior displaced Type II dens fracture, initial encounter for closed fracture: Secondary | ICD-10-CM | POA: Diagnosis not present

## 2022-09-26 MED ORDER — MORPHINE SULFATE 20 MG/5ML PO SOLN: 5.0000 mg | ORAL | 0 refills | Status: DC | PRN

## 2022-09-26 MED ORDER — GLYCOPYRROLATE 0.2 MG/ML IJ SOLN: 0.2000 mg | INTRAMUSCULAR | 4 refills | Status: DC | PRN

## 2022-09-26 MED ORDER — GLYCOPYRROLATE 1 MG PO TABS: 1.0000 mg | ORAL_TABLET | ORAL | 1 refills | Status: DC | PRN

## 2022-09-26 MED ORDER — ATROPINE SULFATE 1 % OP SOLN: 4.0000 [drp] | OPHTHALMIC | 12 refills | Status: DC | PRN

## 2022-09-26 NOTE — Progress Notes (Signed)
Discharge instructions given to husband, Jenny Reichmann, verbalized understanding. SQ injection demonstrated for robinul and he verbalized understanding. IV to be removed upon arrival of EMS. Patient to be transported home with foley.

## 2022-09-26 NOTE — Progress Notes (Signed)
Subjective: The patient is alert but mildly confused.  She is in no apparent distress.  She feels "okay".  Objective: Vital signs in last 24 hours:   Estimated body mass index is 25.62 kg/m as calculated from the following:   Height as of this encounter: 5\' 6"  (1.676 m).   Weight as of this encounter: 72 kg.   Intake/Output from previous day: 10/08 0701 - 10/09 0700 In: 0  Out: 1000 [Urine:1000] Intake/Output this shift: No intake/output data recorded.  Physical exam the patient is alert.  She is moving all 4 extremities.  Lab Results: No results for input(s): "WBC", "HGB", "HCT", "PLT" in the last 72 hours. BMET No results for input(s): "NA", "K", "CL", "CO2", "GLUCOSE", "BUN", "CREATININE", "CALCIUM" in the last 72 hours.  Studies/Results: No results found.  Assessment/Plan: Postop day #4: Unfortunately the patient has terminal cancer.  She has been made DNR and the plan is to send her to [facility].  Her husband has stepped away for breakfast.  LOS: 4 days     Ophelia Charter 09/26/2022, 8:11 AM

## 2022-09-26 NOTE — Progress Notes (Signed)
I spoke with the patient's husband.  The plan is to take her home with hospice support.

## 2022-09-26 NOTE — Progress Notes (Signed)
Lilli Light, NP and explained the situation and he has looped in Dr. Nevada Crane.

## 2022-09-26 NOTE — Discharge Summary (Signed)
Tammy Bowers SAY:[id number] DOB: 24-Mar-1951 DOA: 09/22/2022  PCP: Mckinley Jewel, MD  Admit date: 09/22/2022 Discharge date: 09/26/2022  Time spent: 35 minutes     Discharge Diagnoses:  Principal Problem:   Closed odontoid fracture with type II morphology and posterior displacement ([place]) Active Problems:   S/P cervical spinal fusion   Acute respiratory failure with hypoxia [facility])   Discharge Condition: stable  Diet recommendation: ad lib  Filed Weights   09/22/22 0557  Weight: 72 kg    History of present illness, hospital course Hx small cell lung cancer, admitted after mechanical fall resulting in odontoid fracture, s/p fusion by neusorugery, post-op course complicated by difficulty weaning from ventilator. Bronchoscopy 10/5 reveals right mainstem total occlusion from tumor burdern as well as partial occlusion of the left mainstem. Palliative involved, transitioned to comfort care. Discharging home with home hospice.    Procedures: C1-2 posterior instrumentation and fusion with intervertebral bone graft extender with application of spinal neuro navigation  Consultations: Neurosurgery, palliative  Discharge Exam: Vitals:   09/25/22 0020 09/25/22 0500  BP:  101/85  Pulse:  (!) 108  Resp:  14  Temp:  98.5 F (36.9 C)  SpO2: 90% 90%    General: asleep Cardiovascular: RR Respiratory: rhonchorous breathing  Discharge Instructions   Discharge Instructions     Diet general   Complete by: As directed    Increase activity slowly   Complete by: As directed    No wound care   Complete by: As directed       Allergies as of 09/26/2022       Reactions   Lidocaine Swelling   SWELLING REACTION UNSPECIFIED         Medication List     STOP taking these medications    atorvastatin 10 MG tablet Commonly known as: LIPITOR   azithromycin 250 MG tablet Commonly known as: ZITHROMAX   cyclobenzaprine 5 MG tablet Commonly known as: FLEXERIL    diphenoxylate-atropine 2.5-0.025 MG tablet Commonly known as: Lomotil   Fluticasone-Salmeterol 500-50 MCG/DOSE Aepb Commonly known as: ADVAIR   levothyroxine 125 MCG tablet Commonly known as: SYNTHROID   nystatin 100000 UNIT/ML suspension Commonly known as: MYCOSTATIN   OVER THE COUNTER MEDICATION   oxyCODONE-acetaminophen 5-325 MG tablet Commonly known as: PERCOCET/ROXICET   oxyCODONE-acetaminophen 7.5-325 MG tablet Commonly known as: PERCOCET   prochlorperazine 10 MG tablet Commonly known as: COMPAZINE   topiramate 50 MG tablet Commonly known as: TOPAMAX       TAKE these medications    acetaminophen 500 MG tablet Commonly known as: TYLENOL Take 1,000 mg by mouth every 6 (six) hours as needed for moderate pain.   albuterol 108 (90 Base) MCG/ACT inhaler Commonly known as: VENTOLIN HFA Inhale 2 puffs into the lungs every 4 (four) hours as needed for wheezing.   atropine 1 % ophthalmic solution Place 4 drops under the tongue every 4 (four) hours as needed (secretions).   glycopyrrolate 0.2 MG/ML injection Commonly known as: ROBINUL Inject 1 mL (0.2 mg total) into the skin every 4 (four) hours as needed (excessive secretions).   morphine 20 MG/5ML solution Take 1.3 mLs (5.2 mg total) by mouth every 2 (two) hours as needed for pain.   ondansetron 8 MG disintegrating tablet Commonly known as: ZOFRAN-ODT Take 1 tablet (8 mg total) by mouth every 8 (eight) hours as needed for nausea or vomiting.       Allergies  Allergen Reactions   Lidocaine Swelling    SWELLING REACTION UNSPECIFIED  The results of significant diagnostics from this hospitalization (including imaging, microbiology, ancillary and laboratory) are listed below for reference.    Significant Diagnostic Studies: DG Cervical Spine 1 View  Result Date: 09/22/2022 CLINICAL DATA:  Fluoroscopic assistance for cervical spine surgery EXAM: DG CERVICAL SPINE - 1 VIEW COMPARISON:  08/23/2022  FINDINGS: Fractures are seen in the posterior aspects of C1 vertebra on both sides. Postfusion images are not available for review. Fluoroscopic time 5 seconds. Radiation dose 0.46 mGy. IMPRESSION: Fluoroscopic assistance was provided for cervical spine surgery. Electronically Signed   By: Elmer Picker M.D.   On: 09/22/2022 16:46   DG O-ARM IMAGE ONLY/NO REPORT  Result Date: 09/22/2022 There is no Radiologist interpretation  for this exam.  DG C-Arm 1-60 Min-No Report  Result Date: 09/22/2022 Fluoroscopy was utilized by the requesting physician.  No radiographic interpretation.   DG Abd Portable 1V  Result Date: 09/22/2022 CLINICAL DATA:  Enteric catheter placement EXAM: PORTABLE ABDOMEN - 1 VIEW COMPARISON:  07/30/2022 FINDINGS: Frontal view of the lower chest and upper abdomen demonstrates enteric catheter passing below diaphragm, tip and side port projecting over the gastric body. Distal extent of a right chest wall port is identified overlying superior vena cava. Complete opacification of the right hemithorax with volume loss, consistent with known right hilar mass, right lung atelectasis, and pleural effusion. IMPRESSION: 1. Enteric catheter tip projecting over the gastric body. Electronically Signed   By: Randa Ngo M.D.   On: 09/22/2022 16:21   DG CHEST PORT 1 VIEW  Result Date: 09/22/2022 CLINICAL DATA:  Intubation. EXAM: PORTABLE CHEST 1 VIEW COMPARISON:  July 30, 2022. FINDINGS: Endotracheal tube is in grossly good position. Stable position of right internal jugular Port-A-Cath. There is now noted complete opacification of the right hemithorax with some degree of mediastinal shift to the right suggesting combination of pleural effusion and atelectasis. IMPRESSION: Endotracheal tube in grossly good position. Complete opacification of right hemithorax is now noted with some degree of mediastinal shift to the right suggesting combination of pleural effusion and atelectasis.  Electronically Signed   By: Marijo Conception M.D.   On: 09/22/2022 12:39    Microbiology: Recent Results (from the past 240 hour(s))  MRSA Next Gen by PCR, Nasal     Status: None   Collection Time: 09/22/22  2:27 PM   Specimen: Nasal Mucosa; Nasal Swab  Result Value Ref Range Status   MRSA by PCR Next Gen NOT DETECTED NOT DETECTED Final    Comment: (NOTE) The GeneXpert MRSA Assay (FDA approved for NASAL specimens only), is one component of a comprehensive MRSA colonization surveillance program. It is not intended to diagnose MRSA infection nor to guide or monitor treatment for MRSA infections. Test performance is not FDA approved in patients less than 22 years old. Performed at [facility], [place] [address] [place],  [address]      Labs: Basic Metabolic Panel: Recent Labs  Lab 09/22/22 1140 09/22/22 1242 09/23/22 0651  NA 140 140 139  K 3.2* 3.1* 4.2  CL  --   --  106  CO2  --   --  20*  GLUCOSE  --   --  127*  BUN  --   --  21  CREATININE  --   --  1.14*  CALCIUM  --   --  8.7*   Liver Function Tests: No results for input(s): "AST", "ALT", "ALKPHOS", "BILITOT", "PROT", "ALBUMIN" in the last 168 hours. No results for  input(s): "LIPASE", "AMYLASE" in the last 168 hours. No results for input(s): "AMMONIA" in the last 168 hours. CBC: Recent Labs  Lab 09/22/22 1140 09/22/22 1242 09/23/22 0651  WBC  --   --  14.3*  HGB 12.2 10.9* 11.8*  HCT 36.0 32.0* 36.7  MCV  --   --  97.9  PLT  --   --  365   Cardiac Enzymes: No results for input(s): "CKTOTAL", "CKMB", "CKMBINDEX", "TROPONINI" in the last 168 hours. BNP: BNP (last 3 results) No results for input(s): "BNP" in the last 8760 hours.  ProBNP (last 3 results) No results for input(s): "PROBNP" in the last 8760 hours.  CBG: No results for input(s): "GLUCAP" in the last 168 hours.     Signed:  Desma Maxim MD.  [facility] 09/26/2022, 10:49 AM

## 2022-09-26 NOTE — Care Management Important Message (Signed)
Important Message  Patient Details  Name: Tammy Bowers MRN: [id number] Date of Birth: 30-Apr-1951   Medicare Important Message Given:  Other (see comment)     Hannah Beat 09/26/2022, 2:38 PM

## 2022-09-26 NOTE — Progress Notes (Signed)
Call placed to [facility] to see if they can get the morphine for this pt, will await their return call.

## 2022-09-26 NOTE — TOC Initial Note (Addendum)
Transition of Care [facility]) - Initial/Assessment Note    Patient Details  Name: Tammy Bowers MRN: [id number] Date of Birth: 1951/07/19  Transition of [facility]) CM/SW Contact:    Tammy Favre, RN Phone Number: 09/26/2022, 10:23 AM  Clinical Narrative:                 Confirmed with patient's husband Tammy Bowers [phone] that plan is to take [place] home ( confirmed address) with hospice through [facility].  [facility] has ordered home oxygen, once in place NCM will call [facility] for transportation. [name] has NCM direct cell number    1440 Oxygen has been delivered. [facility] called and on way. MD and nurse aware   Expected Discharge Plan: [place]     Patient Goals and CMS Choice Patient states their goals for this hospitalization and ongoing recovery are:: husband : to go home CMS Medicare.gov Compare Post Acute Care list provided to:: Patient Represenative (must comment) (husband) Choice offered to / list presented to : Spouse  Expected Discharge Plan and Services Expected Discharge Plan: [place]   Discharge Planning Services: CM Consult Post Acute Care Choice: Hospice Living arrangements for the past 2 months: Apartment                                      Prior Living Arrangements/Services Living arrangements for the past 2 months: Apartment Lives with:: Spouse              Current home services: DME    Activities of Daily Living Home Assistive Devices/Equipment: Hospital bed, Walker (specify type) ADL Screening (condition at time of admission) Patient's cognitive ability adequate to safely complete daily activities?: Yes Is the patient deaf or have difficulty hearing?: No Does the patient have difficulty seeing, even when wearing glasses/contacts?: No Does the patient have difficulty concentrating, remembering, or making decisions?: Yes Patient able to express need for assistance with ADLs?: Yes Does the patient have difficulty  dressing or bathing?: Yes Independently performs ADLs?: No Communication: Independent Dressing (OT): Dependent Is this a change from baseline?: Change from baseline, expected to last >3 days Grooming: Dependent Is this a change from baseline?: Change from baseline, expected to last >3 days Feeding: Dependent Is this a change from baseline?: Change from baseline, expected to last >3 days Bathing: Dependent Is this a change from baseline?: Change from baseline, expected to last >3 days Toileting: Dependent Is this a change from baseline?: Change from baseline, expected to last >3days In/Out Bed: Dependent Is this a change from baseline?: Change from baseline, expected to last >3 days Walks in Home: Dependent Is this a change from baseline?: Change from baseline, expected to last >3 days Does the patient have difficulty walking or climbing stairs?: Yes Weakness of Legs: Both Weakness of Arms/Hands: Both  Permission Sought/Granted                  Emotional Assessment              Admission diagnosis:  Closed odontoid fracture with type II morphology and posterior displacement ([place]) [S12.111A] S/P cervical spinal fusion [Z98.1] Patient Active Problem List   Diagnosis Date Noted   Closed odontoid fracture with type II morphology and posterior displacement ([place]) 09/22/2022   S/P cervical spinal fusion 09/22/2022   Acute respiratory failure with hypoxia ([place])    Tobacco abuse 05/19/2022  Abnormal gait 03/29/2022   Aphasia 03/29/2022   Chronic kidney disease, stage 3b ([place]) 03/29/2022   Hemisensory deficit 03/29/2022   Incontinence of urine 03/29/2022   Malignant neoplasm of main bronchus ([place]) 03/29/2022   Recurrent falls 03/29/2022   Abnormal brain MRI 03/29/2022   Migraine headache 03/29/2022   TIA (transient ischemic attack) 11/27/2020   Hypothyroidism (acquired) 08/05/2020   Anorgasmia of female 07/30/2019   CINV (chemotherapy-induced nausea and vomiting)  07/30/2019   Malignant neoplasm of unspecified part of unspecified bronchus or lung ([place]) 07/18/2019   Port-A-Cath in place 04/24/2019   Encounter for antineoplastic immunotherapy 02/23/2019   Goals of care, counseling/discussion 02/23/2019   Oral thrush 05/18/2017   Extravasation accident 03/07/2017   Pancytopenia due to antineoplastic chemotherapy ([place]) 11/19/2016   Neutropenic fever ([place]) 11/19/2016   Neutropenia ([place]) 11/19/2016   Bronchitis 11/19/2016   Small cell lung cancer ([place]) 10/27/2016   Encounter for antineoplastic chemotherapy 10/27/2016   Substance abuse ([place])    Hilar mass 10/10/2016   Lung nodules 10/10/2016   Adenopathy 10/10/2016   Mass of both adrenal glands ([place]) 10/10/2016   Asthma    COPD (chronic obstructive pulmonary disease) ([place])    Insomnia    Mood swing    UNSPECIFIED INFECTION OF BONE ANKLE AND FOOT 10/22/2009   PCP:  Mckinley Jewel, MD Pharmacy:   [facility] [place], [address] AT [address] [place] [address] Phone: [phone] Fax: [phone]  [name], [place] - [address] AT [place] [address] [place] [address] Phone: [phone] Fax: [phone]  [id number] - [place], [place] - [address] [place] [address] Phone: [phone] Fax: [phone]     Social Determinants of Health (SDOH) Interventions    Readmission Risk Interventions    08/01/2022   10:44 AM 08/01/2022   10:43 AM  Readmission Risk Prevention Plan  Transportation Screening Complete Complete  PCP or Specialist Appt within 3-5 Days Complete Complete  HRI or Home Care Consult Complete Complete  Social Work Consult for [place] Planning/Counseling Complete Complete  Palliative Care Screening  Not Applicable  Medication Review Designer, fashion/clothing) Complete Referral to Pharmacy

## 2022-09-26 NOTE — Progress Notes (Signed)
Husband called back and the rx that the Dr had called was the 20mg /5 ml and not the stronger dosage of 20 mg/68ml. [facility] and other pharmac[facility] do not have the 20 mg/25ml but do have the 20mg /ml dosage. Now Dr. Si Raider is gone. Paged the hospitalist on call to call me.  Husband wanting to get the medicine for his wife who is now due a dose of morphine.

## 2022-09-26 NOTE — Progress Notes (Signed)
Pts husband called and the MSO4 is not available at the [facility] the Rx was sent to. I called [facility] and they do not have it either. I called [facility] and they do not have that concentration but do have 20mg /ml concentration. I secure chatted Dr. Si Raider so he can call that in and provided the phone # of that pharmacy.

## 2022-09-26 NOTE — Progress Notes (Signed)
10/9 Pt is under Comfort Care. Respectfully, did not give patient IM Letter.

## 2022-09-26 NOTE — Progress Notes (Signed)
Manufacturing engineer [facility])  Initial plans to d/c to [facility] for EOL care.  Spouse called to advise he would like to take his wife home with hospice support.  O2 will need to be ordered and delivered prior to d/c. [facility] will order.  Pt will need EMS transport home.   Pt will need comfort meds arranged.  Please leave foley in place.  [facility] will update TOC manager once DME is in place and then transport can be arranged.   Thank you, Venia Carbon DNP, RN [facility] Liaison

## 2022-09-27 ENCOUNTER — Ambulatory Visit: Payer: Medicare Other | Admitting: Pulmonary Disease

## 2022-09-27 ENCOUNTER — Other Ambulatory Visit (HOSPITAL_COMMUNITY): Payer: Self-pay

## 2022-09-29 ENCOUNTER — Encounter (HOSPITAL_COMMUNITY): Payer: Self-pay | Admitting: Neurosurgery

## 2022-10-04 ENCOUNTER — Ambulatory Visit: Payer: Medicare Other | Admitting: Diagnostic Neuroimaging

## 2022-10-19 DEATH — deceased

## 2023-09-21 ENCOUNTER — Other Ambulatory Visit: Payer: Self-pay | Admitting: Physician Assistant

## 2023-10-23 ENCOUNTER — Encounter: Payer: Self-pay | Admitting: Internal Medicine
